# Patient Record
Sex: Male | Born: 1940
Health system: Southern US, Community
[De-identification: ages and names within clinical notes are randomized; demographics above are authoritative.]

## PROBLEM LIST (undated history)

## (undated) DIAGNOSIS — I209 Angina pectoris, unspecified: Secondary | ICD-10-CM

## (undated) DIAGNOSIS — I252 Old myocardial infarction: Secondary | ICD-10-CM

## (undated) DIAGNOSIS — I779 Disorder of arteries and arterioles, unspecified: Secondary | ICD-10-CM

## (undated) DIAGNOSIS — K922 Gastrointestinal hemorrhage, unspecified: Secondary | ICD-10-CM

## (undated) DIAGNOSIS — Z9289 Personal history of other medical treatment: Secondary | ICD-10-CM

## (undated) DIAGNOSIS — D649 Anemia, unspecified: Secondary | ICD-10-CM

## (undated) DIAGNOSIS — K219 Gastro-esophageal reflux disease without esophagitis: Secondary | ICD-10-CM

## (undated) DIAGNOSIS — Z87442 Personal history of urinary calculi: Secondary | ICD-10-CM

## (undated) DIAGNOSIS — R519 Headache, unspecified: Secondary | ICD-10-CM

## (undated) DIAGNOSIS — I10 Essential (primary) hypertension: Secondary | ICD-10-CM

## (undated) DIAGNOSIS — Z992 Dependence on renal dialysis: Secondary | ICD-10-CM

## (undated) DIAGNOSIS — N186 End stage renal disease: Secondary | ICD-10-CM

## (undated) DIAGNOSIS — Z720 Tobacco use: Secondary | ICD-10-CM

## (undated) DIAGNOSIS — F32A Depression, unspecified: Secondary | ICD-10-CM

## (undated) DIAGNOSIS — C61 Malignant neoplasm of prostate: Secondary | ICD-10-CM

## (undated) DIAGNOSIS — D509 Iron deficiency anemia, unspecified: Secondary | ICD-10-CM

## (undated) DIAGNOSIS — I480 Paroxysmal atrial fibrillation: Secondary | ICD-10-CM

## (undated) DIAGNOSIS — I219 Acute myocardial infarction, unspecified: Secondary | ICD-10-CM

## (undated) DIAGNOSIS — I2510 Atherosclerotic heart disease of native coronary artery without angina pectoris: Secondary | ICD-10-CM

## (undated) DIAGNOSIS — E7800 Pure hypercholesterolemia, unspecified: Secondary | ICD-10-CM

## (undated) DIAGNOSIS — R51 Headache: Secondary | ICD-10-CM

## (undated) DIAGNOSIS — I5189 Other ill-defined heart diseases: Secondary | ICD-10-CM

## (undated) DIAGNOSIS — I739 Peripheral vascular disease, unspecified: Secondary | ICD-10-CM

## (undated) DIAGNOSIS — F329 Major depressive disorder, single episode, unspecified: Secondary | ICD-10-CM

## (undated) DIAGNOSIS — M1990 Unspecified osteoarthritis, unspecified site: Secondary | ICD-10-CM

## (undated) DIAGNOSIS — R011 Cardiac murmur, unspecified: Secondary | ICD-10-CM

## (undated) HISTORY — DX: Paroxysmal atrial fibrillation: I48.0

## (undated) HISTORY — DX: Atherosclerotic heart disease of native coronary artery without angina pectoris: I25.10

## (undated) HISTORY — DX: End stage renal disease: N18.6

## (undated) HISTORY — DX: Other ill-defined heart diseases: I51.89

## (undated) HISTORY — DX: Peripheral vascular disease, unspecified: I73.9

## (undated) HISTORY — DX: Iron deficiency anemia, unspecified: D50.9

## (undated) HISTORY — DX: Gastrointestinal hemorrhage, unspecified: K92.2

## (undated) HISTORY — DX: Disorder of arteries and arterioles, unspecified: I77.9

## (undated) HISTORY — PX: TONSILLECTOMY: SUR1361

## (undated) HISTORY — DX: Tobacco use: Z72.0

## (undated) HISTORY — DX: Anemia, unspecified: D64.9

## (undated) HISTORY — DX: Acute myocardial infarction, unspecified: I21.9

---

## 1998-12-14 ENCOUNTER — Encounter: Payer: Self-pay | Admitting: Emergency Medicine

## 1998-12-14 ENCOUNTER — Emergency Department (HOSPITAL_COMMUNITY): Admission: EM | Admit: 1998-12-14 | Discharge: 1998-12-14 | Payer: Self-pay | Admitting: Emergency Medicine

## 2000-04-24 ENCOUNTER — Emergency Department (HOSPITAL_COMMUNITY): Admission: EM | Admit: 2000-04-24 | Discharge: 2000-04-25 | Payer: Self-pay | Admitting: Emergency Medicine

## 2000-04-25 ENCOUNTER — Encounter: Payer: Self-pay | Admitting: Emergency Medicine

## 2001-03-21 ENCOUNTER — Encounter: Payer: Self-pay | Admitting: Emergency Medicine

## 2001-03-21 ENCOUNTER — Emergency Department (HOSPITAL_COMMUNITY): Admission: EM | Admit: 2001-03-21 | Discharge: 2001-03-21 | Payer: Self-pay | Admitting: Emergency Medicine

## 2002-07-16 ENCOUNTER — Emergency Department (HOSPITAL_COMMUNITY): Admission: EM | Admit: 2002-07-16 | Discharge: 2002-07-16 | Payer: Self-pay | Admitting: Emergency Medicine

## 2002-07-16 ENCOUNTER — Encounter: Payer: Self-pay | Admitting: Emergency Medicine

## 2002-07-17 ENCOUNTER — Encounter: Payer: Self-pay | Admitting: *Deleted

## 2002-08-19 ENCOUNTER — Encounter: Admission: RE | Admit: 2002-08-19 | Discharge: 2002-08-19 | Payer: Self-pay | Admitting: Internal Medicine

## 2004-03-25 ENCOUNTER — Emergency Department (HOSPITAL_COMMUNITY): Admission: EM | Admit: 2004-03-25 | Discharge: 2004-03-25 | Payer: Self-pay | Admitting: Emergency Medicine

## 2004-03-27 ENCOUNTER — Ambulatory Visit: Payer: Self-pay | Admitting: Internal Medicine

## 2004-04-06 ENCOUNTER — Ambulatory Visit (HOSPITAL_COMMUNITY): Admission: RE | Admit: 2004-04-06 | Discharge: 2004-04-06 | Payer: Self-pay | Admitting: Internal Medicine

## 2004-04-06 ENCOUNTER — Ambulatory Visit: Payer: Self-pay | Admitting: Internal Medicine

## 2004-04-06 ENCOUNTER — Encounter (INDEPENDENT_AMBULATORY_CARE_PROVIDER_SITE_OTHER): Payer: Self-pay | Admitting: *Deleted

## 2004-05-21 ENCOUNTER — Ambulatory Visit: Payer: Self-pay | Admitting: Internal Medicine

## 2004-07-10 ENCOUNTER — Ambulatory Visit: Payer: Self-pay | Admitting: Internal Medicine

## 2004-07-12 ENCOUNTER — Ambulatory Visit (HOSPITAL_COMMUNITY): Admission: RE | Admit: 2004-07-12 | Discharge: 2004-07-12 | Payer: Self-pay | Admitting: Internal Medicine

## 2004-09-14 ENCOUNTER — Ambulatory Visit (HOSPITAL_COMMUNITY): Admission: RE | Admit: 2004-09-14 | Discharge: 2004-09-14 | Payer: Self-pay | Admitting: Urology

## 2004-10-24 ENCOUNTER — Ambulatory Visit (HOSPITAL_COMMUNITY): Admission: RE | Admit: 2004-10-24 | Discharge: 2004-10-24 | Payer: Self-pay | Admitting: Urology

## 2004-11-06 ENCOUNTER — Ambulatory Visit (HOSPITAL_COMMUNITY): Admission: RE | Admit: 2004-11-06 | Discharge: 2004-11-06 | Payer: Self-pay | Admitting: Urology

## 2005-02-21 ENCOUNTER — Ambulatory Visit: Payer: Self-pay | Admitting: Internal Medicine

## 2005-02-28 ENCOUNTER — Ambulatory Visit: Payer: Self-pay | Admitting: Internal Medicine

## 2005-02-28 ENCOUNTER — Ambulatory Visit (HOSPITAL_COMMUNITY): Admission: RE | Admit: 2005-02-28 | Discharge: 2005-02-28 | Payer: Self-pay | Admitting: Internal Medicine

## 2005-03-26 ENCOUNTER — Ambulatory Visit: Payer: Self-pay | Admitting: Hospitalist

## 2005-04-10 ENCOUNTER — Ambulatory Visit: Payer: Self-pay | Admitting: Internal Medicine

## 2008-02-19 ENCOUNTER — Ambulatory Visit (HOSPITAL_COMMUNITY): Admission: RE | Admit: 2008-02-19 | Discharge: 2008-02-19 | Payer: Self-pay | Admitting: Urology

## 2009-04-26 ENCOUNTER — Ambulatory Visit (HOSPITAL_COMMUNITY): Admission: RE | Admit: 2009-04-26 | Discharge: 2009-04-26 | Payer: Self-pay | Admitting: Urology

## 2009-05-12 ENCOUNTER — Ambulatory Visit: Admission: RE | Admit: 2009-05-12 | Discharge: 2009-06-01 | Payer: Self-pay | Admitting: Radiation Oncology

## 2009-07-17 ENCOUNTER — Encounter: Admission: RE | Admit: 2009-07-17 | Discharge: 2009-07-17 | Payer: Self-pay | Admitting: Family Medicine

## 2009-10-06 ENCOUNTER — Ambulatory Visit: Admission: RE | Admit: 2009-10-06 | Discharge: 2010-01-04 | Payer: Self-pay | Admitting: Radiation Oncology

## 2009-12-26 LAB — URINALYSIS, MICROSCOPIC - CHCC
Bilirubin (Urine): NEGATIVE
Glucose: NEGATIVE g/dL
Ketones: NEGATIVE mg/dL
Leukocyte Esterase: NEGATIVE
Nitrite: NEGATIVE
Protein: NEGATIVE mg/dL
Specific Gravity, Urine: 1.02 (ref 1.003–1.035)
pH: 5 (ref 4.6–8.0)

## 2009-12-28 LAB — URINE CULTURE

## 2010-01-04 ENCOUNTER — Ambulatory Visit: Admission: RE | Admit: 2010-01-04 | Discharge: 2010-02-03 | Payer: Self-pay | Admitting: Radiation Oncology

## 2010-01-17 ENCOUNTER — Encounter: Admission: RE | Admit: 2010-01-17 | Payer: Self-pay | Admitting: Family Medicine

## 2010-03-08 ENCOUNTER — Encounter (HOSPITAL_COMMUNITY)
Admission: RE | Admit: 2010-03-08 | Discharge: 2010-04-17 | Payer: Self-pay | Source: Home / Self Care | Attending: Nephrology | Admitting: Nephrology

## 2010-03-21 LAB — POCT HEMOGLOBIN-HEMACUE: Hemoglobin: 10.9 g/dL — ABNORMAL LOW (ref 13.0–17.0)

## 2010-04-02 LAB — POCT HEMOGLOBIN-HEMACUE: Hemoglobin: 11.6 g/dL — ABNORMAL LOW (ref 13.0–17.0)

## 2010-04-07 ENCOUNTER — Encounter: Payer: Self-pay | Admitting: Family Medicine

## 2010-04-08 ENCOUNTER — Encounter: Payer: Self-pay | Admitting: Urology

## 2010-04-09 LAB — RENAL FUNCTION PANEL
Albumin: 4.3 g/dL (ref 3.5–5.2)
BUN: 66 mg/dL — ABNORMAL HIGH (ref 6–23)
CO2: 13 mEq/L — ABNORMAL LOW (ref 19–32)
Calcium: 9.3 mg/dL (ref 8.4–10.5)
Chloride: 112 mEq/L (ref 96–112)
Creatinine, Ser: 3.45 mg/dL — ABNORMAL HIGH (ref 0.4–1.5)
GFR calc Af Amer: 22 mL/min — ABNORMAL LOW (ref >60)
GFR calc non Af Amer: 18 mL/min — ABNORMAL LOW (ref >60)
Glucose, Bld: 103 mg/dL — ABNORMAL HIGH (ref 70–99)
Phosphorus: 4.4 mg/dL (ref 2.3–4.6)
Potassium: 5 mEq/L (ref 3.5–5.1)
Sodium: 135 mEq/L (ref 135–145)

## 2010-04-09 LAB — FERRITIN: Ferritin: 460 ng/mL — ABNORMAL HIGH (ref 22–322)

## 2010-04-09 LAB — IRON AND TIBC
Iron: 56 ug/dL (ref 42–135)
Saturation Ratios: 21 % (ref 20–55)
TIBC: 271 ug/dL (ref 215–435)
UIBC: 215 ug/dL

## 2010-04-09 LAB — POCT HEMOGLOBIN-HEMACUE: Hemoglobin: 12.5 g/dL — ABNORMAL LOW (ref 13.0–17.0)

## 2010-04-18 ENCOUNTER — Other Ambulatory Visit: Payer: Self-pay

## 2010-04-18 ENCOUNTER — Encounter (HOSPITAL_COMMUNITY): Payer: MEDICARE | Attending: Nephrology

## 2010-04-18 DIAGNOSIS — N183 Chronic kidney disease, stage 3 unspecified: Secondary | ICD-10-CM | POA: Insufficient documentation

## 2010-04-18 DIAGNOSIS — D638 Anemia in other chronic diseases classified elsewhere: Secondary | ICD-10-CM | POA: Insufficient documentation

## 2010-05-02 ENCOUNTER — Encounter (HOSPITAL_COMMUNITY): Payer: MEDICARE

## 2010-05-02 ENCOUNTER — Other Ambulatory Visit: Payer: Self-pay

## 2010-05-02 LAB — POCT HEMOGLOBIN-HEMACUE: Hemoglobin: 11.4 g/dL — ABNORMAL LOW (ref 13.0–17.0)

## 2010-05-07 LAB — POCT HEMOGLOBIN-HEMACUE: Hemoglobin: 12.2 g/dL — ABNORMAL LOW (ref 13.0–17.0)

## 2010-05-16 ENCOUNTER — Other Ambulatory Visit: Payer: Self-pay | Admitting: Nephrology

## 2010-05-16 ENCOUNTER — Other Ambulatory Visit: Payer: Self-pay

## 2010-05-16 ENCOUNTER — Encounter (HOSPITAL_COMMUNITY): Payer: MEDICARE

## 2010-05-16 LAB — IRON AND TIBC
Iron: 82 ug/dL (ref 42–135)
Saturation Ratios: 33 % (ref 20–55)
TIBC: 250 ug/dL (ref 215–435)
UIBC: 168 ug/dL

## 2010-05-16 LAB — POCT HEMOGLOBIN-HEMACUE: Hemoglobin: 9.8 g/dL — ABNORMAL LOW (ref 13.0–17.0)

## 2010-05-16 LAB — RENAL FUNCTION PANEL
Albumin: 3.8 g/dL (ref 3.5–5.2)
BUN: 38 mg/dL — ABNORMAL HIGH (ref 6–23)
CO2: 21 mEq/L (ref 19–32)
Calcium: 9.3 mg/dL (ref 8.4–10.5)
Chloride: 107 mEq/L (ref 96–112)
Creatinine, Ser: 2.59 mg/dL — ABNORMAL HIGH (ref 0.4–1.5)
GFR calc Af Amer: 30 mL/min — ABNORMAL LOW (ref >60)
GFR calc non Af Amer: 25 mL/min — ABNORMAL LOW (ref >60)
Glucose, Bld: 114 mg/dL — ABNORMAL HIGH (ref 70–99)
Phosphorus: 2.7 mg/dL (ref 2.3–4.6)
Potassium: 5.3 mEq/L — ABNORMAL HIGH (ref 3.5–5.1)
Sodium: 136 mEq/L (ref 135–145)

## 2010-05-16 LAB — FERRITIN: Ferritin: 873 ng/mL — ABNORMAL HIGH (ref 22–322)

## 2010-05-28 LAB — IRON AND TIBC: Iron: 62 ug/dL (ref 42–135)

## 2010-05-28 LAB — POCT HEMOGLOBIN-HEMACUE: Hemoglobin: 9.4 g/dL — ABNORMAL LOW (ref 13.0–17.0)

## 2010-05-30 ENCOUNTER — Other Ambulatory Visit: Payer: Self-pay | Admitting: Nephrology

## 2010-05-30 ENCOUNTER — Encounter (HOSPITAL_COMMUNITY): Payer: Medicare Other | Attending: Nephrology

## 2010-05-30 ENCOUNTER — Other Ambulatory Visit: Payer: Self-pay

## 2010-05-30 DIAGNOSIS — D638 Anemia in other chronic diseases classified elsewhere: Secondary | ICD-10-CM | POA: Insufficient documentation

## 2010-05-30 DIAGNOSIS — N183 Chronic kidney disease, stage 3 unspecified: Secondary | ICD-10-CM | POA: Insufficient documentation

## 2010-05-31 LAB — PTH, INTACT AND CALCIUM
Calcium, Total (PTH): 9.5 mg/dL (ref 8.4–10.5)
PTH: 43.2 pg/mL (ref 14.0–72.0)

## 2010-06-13 ENCOUNTER — Other Ambulatory Visit: Payer: Self-pay | Admitting: Nephrology

## 2010-06-13 ENCOUNTER — Encounter (HOSPITAL_COMMUNITY): Payer: Medicare Other

## 2010-06-13 LAB — IRON AND TIBC
Saturation Ratios: 32 % (ref 20–55)
TIBC: 229 ug/dL (ref 215–435)

## 2010-06-27 ENCOUNTER — Encounter (HOSPITAL_COMMUNITY): Payer: MEDICARE | Attending: Nephrology

## 2010-06-27 ENCOUNTER — Other Ambulatory Visit: Payer: Self-pay | Admitting: Nephrology

## 2010-06-27 DIAGNOSIS — D638 Anemia in other chronic diseases classified elsewhere: Secondary | ICD-10-CM | POA: Insufficient documentation

## 2010-06-27 DIAGNOSIS — N183 Chronic kidney disease, stage 3 unspecified: Secondary | ICD-10-CM | POA: Insufficient documentation

## 2010-07-11 ENCOUNTER — Encounter (HOSPITAL_COMMUNITY): Payer: MEDICARE

## 2010-07-11 ENCOUNTER — Other Ambulatory Visit: Payer: Self-pay | Admitting: Nephrology

## 2010-07-11 LAB — IRON AND TIBC
Saturation Ratios: 35 % (ref 20–55)
TIBC: 285 ug/dL (ref 215–435)

## 2010-07-11 LAB — FERRITIN: Ferritin: 537 ng/mL — ABNORMAL HIGH (ref 22–322)

## 2010-07-25 ENCOUNTER — Encounter (HOSPITAL_COMMUNITY): Payer: Medicare Other | Attending: Nephrology

## 2010-07-25 ENCOUNTER — Other Ambulatory Visit: Payer: Self-pay | Admitting: Nephrology

## 2010-07-25 DIAGNOSIS — N183 Chronic kidney disease, stage 3 unspecified: Secondary | ICD-10-CM | POA: Insufficient documentation

## 2010-07-25 DIAGNOSIS — D638 Anemia in other chronic diseases classified elsewhere: Secondary | ICD-10-CM | POA: Insufficient documentation

## 2010-07-25 LAB — POCT HEMOGLOBIN-HEMACUE: Hemoglobin: 11.8 g/dL — ABNORMAL LOW (ref 13.0–17.0)

## 2010-08-08 ENCOUNTER — Other Ambulatory Visit: Payer: Self-pay | Admitting: Nephrology

## 2010-08-08 ENCOUNTER — Encounter (HOSPITAL_COMMUNITY): Payer: Medicare Other

## 2010-08-08 LAB — RENAL FUNCTION PANEL
BUN: 41 mg/dL — ABNORMAL HIGH (ref 6–23)
Calcium: 9.4 mg/dL (ref 8.4–10.5)
Glucose, Bld: 79 mg/dL (ref 70–99)
Phosphorus: 2.6 mg/dL (ref 2.3–4.6)
Potassium: 4.6 mEq/L (ref 3.5–5.1)

## 2010-08-08 LAB — IRON AND TIBC
TIBC: 244 ug/dL (ref 215–435)
UIBC: 152 ug/dL

## 2010-08-08 LAB — FERRITIN: Ferritin: 596 ng/mL — ABNORMAL HIGH (ref 22–322)

## 2010-08-09 LAB — PTH, INTACT AND CALCIUM: PTH: 52.4 pg/mL (ref 14.0–72.0)

## 2010-08-22 ENCOUNTER — Encounter (HOSPITAL_COMMUNITY)
Admission: RE | Admit: 2010-08-22 | Discharge: 2010-08-22 | Disposition: A | Discharge status: Home / Self Care | Payer: Medicare Other | Source: Ambulatory Visit | Attending: Nephrology | Admitting: Nephrology

## 2010-08-22 ENCOUNTER — Other Ambulatory Visit: Payer: Self-pay | Admitting: Nephrology

## 2010-08-22 DIAGNOSIS — N183 Chronic kidney disease, stage 3 unspecified: Secondary | ICD-10-CM | POA: Insufficient documentation

## 2010-08-22 DIAGNOSIS — D638 Anemia in other chronic diseases classified elsewhere: Secondary | ICD-10-CM | POA: Insufficient documentation

## 2010-08-22 LAB — FERRITIN: Ferritin: 710 ng/mL — ABNORMAL HIGH (ref 22–322)

## 2010-08-22 LAB — IRON AND TIBC: Iron: 81 ug/dL (ref 42–135)

## 2010-09-03 ENCOUNTER — Ambulatory Visit
Admission: RE | Admit: 2010-09-03 | Discharge: 2010-09-03 | Disposition: A | Discharge status: Home / Self Care | Payer: Medicare Other | Source: Ambulatory Visit | Attending: Radiation Oncology | Admitting: Radiation Oncology

## 2010-09-05 ENCOUNTER — Other Ambulatory Visit: Payer: Self-pay | Admitting: Nephrology

## 2010-09-05 ENCOUNTER — Encounter (HOSPITAL_COMMUNITY): Payer: Medicare Other

## 2010-09-05 LAB — RENAL FUNCTION PANEL
CO2: 21 mEq/L (ref 19–32)
Chloride: 111 mEq/L (ref 96–112)
Creatinine, Ser: 2.73 mg/dL — ABNORMAL HIGH (ref 0.50–1.35)
GFR calc Af Amer: 28 mL/min — ABNORMAL LOW (ref >60)
GFR calc non Af Amer: 23 mL/min — ABNORMAL LOW (ref >60)
Potassium: 4.5 mEq/L (ref 3.5–5.1)
Sodium: 139 mEq/L (ref 135–145)

## 2010-09-06 LAB — PTH, INTACT AND CALCIUM
Calcium, Total (PTH): 9.3 mg/dL (ref 8.4–10.5)
PTH: 47 pg/mL (ref 14.0–72.0)

## 2010-09-06 LAB — POCT HEMOGLOBIN-HEMACUE: Hemoglobin: 10.4 g/dL — ABNORMAL LOW (ref 13.0–17.0)

## 2010-09-20 ENCOUNTER — Encounter (HOSPITAL_COMMUNITY): Payer: Medicare Other

## 2010-09-21 ENCOUNTER — Other Ambulatory Visit: Payer: Self-pay | Admitting: Nephrology

## 2010-09-21 ENCOUNTER — Encounter (HOSPITAL_COMMUNITY)
Admission: RE | Admit: 2010-09-21 | Discharge: 2010-09-21 | Disposition: A | Discharge status: Home / Self Care | Payer: Medicare Other | Source: Ambulatory Visit | Attending: Nephrology | Admitting: Nephrology

## 2010-09-21 DIAGNOSIS — D638 Anemia in other chronic diseases classified elsewhere: Secondary | ICD-10-CM | POA: Insufficient documentation

## 2010-09-21 DIAGNOSIS — N183 Chronic kidney disease, stage 3 unspecified: Secondary | ICD-10-CM | POA: Insufficient documentation

## 2010-10-03 ENCOUNTER — Encounter (HOSPITAL_COMMUNITY): Payer: Medicare Other

## 2010-10-05 ENCOUNTER — Encounter (HOSPITAL_COMMUNITY): Payer: Medicare Other

## 2010-10-05 ENCOUNTER — Other Ambulatory Visit: Payer: Self-pay | Admitting: Nephrology

## 2010-10-05 LAB — POCT HEMOGLOBIN-HEMACUE: Hemoglobin: 10.5 g/dL — ABNORMAL LOW (ref 13.0–17.0)

## 2010-10-05 LAB — IRON AND TIBC
Iron: 109 ug/dL (ref 42–135)
Saturation Ratios: 40 % (ref 20–55)
TIBC: 270 ug/dL (ref 215–435)
UIBC: 161 ug/dL

## 2010-10-17 ENCOUNTER — Other Ambulatory Visit: Payer: Self-pay | Admitting: Nephrology

## 2010-10-17 ENCOUNTER — Encounter (HOSPITAL_COMMUNITY): Payer: Medicare Other | Attending: Nephrology

## 2010-10-17 DIAGNOSIS — D638 Anemia in other chronic diseases classified elsewhere: Secondary | ICD-10-CM | POA: Insufficient documentation

## 2010-10-17 DIAGNOSIS — N183 Chronic kidney disease, stage 3 unspecified: Secondary | ICD-10-CM | POA: Insufficient documentation

## 2010-10-31 ENCOUNTER — Other Ambulatory Visit: Payer: Self-pay | Admitting: Nephrology

## 2010-10-31 ENCOUNTER — Encounter (HOSPITAL_COMMUNITY): Payer: Medicare Other

## 2010-10-31 LAB — IRON AND TIBC
Saturation Ratios: 29 % (ref 20–55)
TIBC: 295 ug/dL (ref 215–435)
UIBC: 210 ug/dL

## 2010-11-14 ENCOUNTER — Encounter (HOSPITAL_COMMUNITY): Payer: Medicare Other

## 2010-11-14 ENCOUNTER — Other Ambulatory Visit: Payer: Self-pay | Admitting: Nephrology

## 2010-11-28 ENCOUNTER — Other Ambulatory Visit: Payer: Self-pay | Admitting: Nephrology

## 2010-11-28 ENCOUNTER — Encounter (HOSPITAL_COMMUNITY): Payer: Medicare Other | Attending: Nephrology

## 2010-11-28 DIAGNOSIS — D638 Anemia in other chronic diseases classified elsewhere: Secondary | ICD-10-CM | POA: Insufficient documentation

## 2010-11-28 DIAGNOSIS — N183 Chronic kidney disease, stage 3 unspecified: Secondary | ICD-10-CM | POA: Insufficient documentation

## 2010-11-28 LAB — IRON AND TIBC
Iron: 98 ug/dL (ref 42–135)
Saturation Ratios: 34 % (ref 20–55)
TIBC: 285 ug/dL (ref 215–435)
UIBC: 187 ug/dL (ref 125–400)

## 2010-11-28 LAB — FERRITIN: Ferritin: 1445 ng/mL — ABNORMAL HIGH (ref 22–322)

## 2010-11-28 LAB — POCT HEMOGLOBIN-HEMACUE: Hemoglobin: 11.2 g/dL — ABNORMAL LOW (ref 13.0–17.0)

## 2010-12-12 ENCOUNTER — Other Ambulatory Visit: Payer: Self-pay | Admitting: Nephrology

## 2010-12-12 ENCOUNTER — Encounter (HOSPITAL_COMMUNITY): Payer: Medicare Other

## 2010-12-26 ENCOUNTER — Encounter (HOSPITAL_COMMUNITY): Payer: Medicare Other | Attending: Nephrology

## 2010-12-26 ENCOUNTER — Other Ambulatory Visit: Payer: Self-pay | Admitting: Nephrology

## 2010-12-26 DIAGNOSIS — D638 Anemia in other chronic diseases classified elsewhere: Secondary | ICD-10-CM | POA: Insufficient documentation

## 2010-12-26 DIAGNOSIS — N183 Chronic kidney disease, stage 3 unspecified: Secondary | ICD-10-CM | POA: Insufficient documentation

## 2010-12-26 LAB — RENAL FUNCTION PANEL
BUN: 54 mg/dL — ABNORMAL HIGH (ref 6–23)
CO2: 17 mEq/L — ABNORMAL LOW (ref 19–32)
Calcium: 10.3 mg/dL (ref 8.4–10.5)
Creatinine, Ser: 2.72 mg/dL — ABNORMAL HIGH (ref 0.50–1.35)
Glucose, Bld: 106 mg/dL — ABNORMAL HIGH (ref 70–99)
Phosphorus: 3.6 mg/dL (ref 2.3–4.6)

## 2010-12-26 LAB — IRON AND TIBC
Saturation Ratios: 30 % (ref 20–55)
UIBC: 206 ug/dL (ref 125–400)

## 2010-12-26 LAB — FERRITIN: Ferritin: 1004 ng/mL — ABNORMAL HIGH (ref 22–322)

## 2011-01-09 ENCOUNTER — Encounter (HOSPITAL_COMMUNITY)
Admission: RE | Admit: 2011-01-09 | Discharge: 2011-01-09 | Payer: Medicare Other | Source: Ambulatory Visit | Attending: Nephrology | Admitting: Nephrology

## 2011-01-09 ENCOUNTER — Other Ambulatory Visit: Payer: Self-pay | Admitting: Nephrology

## 2011-02-04 ENCOUNTER — Other Ambulatory Visit (HOSPITAL_COMMUNITY): Payer: Self-pay | Admitting: *Deleted

## 2011-02-06 ENCOUNTER — Encounter (HOSPITAL_COMMUNITY)
Admission: RE | Admit: 2011-02-06 | Discharge: 2011-02-06 | Disposition: A | Discharge status: Home / Self Care | Payer: Medicare Other | Source: Ambulatory Visit | Attending: Nephrology | Admitting: Nephrology

## 2011-02-06 DIAGNOSIS — N183 Chronic kidney disease, stage 3 unspecified: Secondary | ICD-10-CM | POA: Insufficient documentation

## 2011-02-06 DIAGNOSIS — D638 Anemia in other chronic diseases classified elsewhere: Secondary | ICD-10-CM | POA: Insufficient documentation

## 2011-02-06 LAB — IRON AND TIBC
Iron: 65 ug/dL (ref 42–135)
Saturation Ratios: 27 % (ref 20–55)
TIBC: 239 ug/dL (ref 215–435)
UIBC: 174 ug/dL (ref 125–400)

## 2011-02-06 MED ORDER — EPOETIN ALFA 10000 UNIT/ML IJ SOLN
30000.0000 [IU] | INTRAMUSCULAR | Status: DC
  Administered 2011-02-06: 30000 [IU] via SUBCUTANEOUS

## 2011-02-06 MED ORDER — EPOETIN ALFA 10000 UNIT/ML IJ SOLN
INTRAMUSCULAR | Status: AC
  Administered 2011-02-06: 30000 [IU] via SUBCUTANEOUS
  Filled 2011-02-06: qty 1

## 2011-02-06 MED ORDER — EPOETIN ALFA 20000 UNIT/ML IJ SOLN
INTRAMUSCULAR | Status: AC
  Filled 2011-02-06: qty 1

## 2011-02-24 ENCOUNTER — Emergency Department (HOSPITAL_COMMUNITY)
Admission: EM | Admit: 2011-02-24 | Discharge: 2011-02-24 | Disposition: A | Discharge status: Home / Self Care | Payer: Medicare Other | Attending: Emergency Medicine | Admitting: Emergency Medicine

## 2011-02-24 ENCOUNTER — Encounter: Payer: Self-pay | Admitting: Family Medicine

## 2011-02-24 DIAGNOSIS — Z8546 Personal history of malignant neoplasm of prostate: Secondary | ICD-10-CM | POA: Insufficient documentation

## 2011-02-24 DIAGNOSIS — M109 Gout, unspecified: Secondary | ICD-10-CM | POA: Insufficient documentation

## 2011-02-24 DIAGNOSIS — M25469 Effusion, unspecified knee: Secondary | ICD-10-CM | POA: Insufficient documentation

## 2011-02-24 DIAGNOSIS — M25429 Effusion, unspecified elbow: Secondary | ICD-10-CM | POA: Insufficient documentation

## 2011-02-24 DIAGNOSIS — L989 Disorder of the skin and subcutaneous tissue, unspecified: Secondary | ICD-10-CM | POA: Insufficient documentation

## 2011-02-24 DIAGNOSIS — M25569 Pain in unspecified knee: Secondary | ICD-10-CM | POA: Insufficient documentation

## 2011-02-24 DIAGNOSIS — R269 Unspecified abnormalities of gait and mobility: Secondary | ICD-10-CM | POA: Insufficient documentation

## 2011-02-24 DIAGNOSIS — F17200 Nicotine dependence, unspecified, uncomplicated: Secondary | ICD-10-CM | POA: Insufficient documentation

## 2011-02-24 HISTORY — DX: Malignant neoplasm of prostate: C61

## 2011-02-24 MED ORDER — OXYCODONE-ACETAMINOPHEN 5-325 MG PO TABS
1.0000 | ORAL_TABLET | Freq: Once | ORAL | Status: AC
  Administered 2011-02-24: 1 via ORAL
  Filled 2011-02-24: qty 1

## 2011-02-24 MED ORDER — OXYCODONE-ACETAMINOPHEN 5-325 MG PO TABS
1.0000 | ORAL_TABLET | Freq: Four times a day (QID) | ORAL | Status: AC | PRN
Start: 2011-02-24 — End: 2011-03-06

## 2011-02-24 MED ORDER — PREDNISONE 10 MG PO TABS: 20.0000 mg | ORAL_TABLET | Freq: Every day | ORAL | Status: DC

## 2011-02-24 MED ORDER — PREDNISONE 20 MG PO TABS
60.0000 mg | ORAL_TABLET | Freq: Once | ORAL | Status: AC
  Administered 2011-02-24: 60 mg via ORAL
  Filled 2011-02-24: qty 3

## 2011-02-24 NOTE — ED Notes (Signed)
Per EMS, pt having gout flare up that started 1 week ago. sts pain in right knee down to his foot and left elbow

## 2011-02-24 NOTE — ED Provider Notes (Signed)
Medical screening examination/treatment/procedure(s) were conducted as a shared visit with non-physician practitioner(s) and myself.  I personally evaluated the patient during the encounter Patient with multiple areas of gout. History of renal insufficiency so unable to take colchicine or indomethacin. We'll treat with steroids and pain control.  Blanchie Dessert, MD 02/24/11 [phone]

## 2011-02-24 NOTE — ED Notes (Signed)
sts pain is better in his knee but his elbow still hurts prior to 2nd percocet

## 2011-02-24 NOTE — ED Provider Notes (Signed)
History     CSN: [id number] Arrival date & time: 02/24/2011  5:00 PM   First MD Initiated Contact with Patient 02/24/11 1712      Chief Complaint  Patient presents with  . Gout    (Consider location/radiation/quality/duration/timing/severity/associated sxs/prior treatment) The history is provided by the patient.   the patient is a 70 year old male with a history of gout and renal insufficiency who presents with complaint of an acute gout attack. He describes that 2 weeks ago, he began to have pain in his right knee. He has not taken anything for this pain and it has gradually progressed to include his right foot and the left elbow as well. The pain is throbbing, constant, severe and there are no alleviating factors no palpation and movement of the affected joints aggravate the pain. There has been no prior treatment.  Past Medical History  Diagnosis Date  . Gout   . Renal insufficiency   . Prostate cancer     History reviewed. No pertinent past surgical history.  History reviewed. No pertinent family history.  History  Substance Use Topics  . Smoking status: Current Everyday Smoker  . Smokeless tobacco: Not on file  . Alcohol Use:       Review of Systems  Constitutional: Negative for fever and chills.  HENT: Negative for ear pain, sore throat, neck pain and neck stiffness.   Eyes: Negative for pain and visual disturbance.  Respiratory: Negative for cough, chest tightness and shortness of breath.   Cardiovascular: Negative for chest pain and palpitations.  Gastrointestinal: Negative for nausea, vomiting and abdominal pain.  Genitourinary: Negative for dysuria and flank pain.  Musculoskeletal: Positive for joint swelling and gait problem. Negative for back pain.  Skin: Positive for color change. Negative for rash and wound.  Neurological: Negative for weakness, numbness and headaches.  Psychiatric/Behavioral: Negative for behavioral problems and confusion.     Allergies  Review of patient's allergies indicates no known allergies.  Home Medications   Current Outpatient Rx  Name Route Sig Dispense Refill  . TELMISARTAN 40 MG PO TABS Oral Take 40 mg by mouth daily.        BP 139/83  Pulse 86  Temp(Src) 97.2 F (36.2 C) (Oral)  Resp 20  SpO2 98%  Physical Exam  Nursing note and vitals reviewed. Constitutional: He is oriented to person, place, and time. He appears well-developed and well-nourished.       Uncomfortable appearing  HENT:  Head: Normocephalic and atraumatic.  Right Ear: External ear normal.  Left Ear: External ear normal.       Poor dentition  Eyes: Pupils are equal, round, and reactive to light. Right eye exhibits no discharge. Left eye exhibits no discharge.  Neck: Normal range of motion. Neck supple.  Cardiovascular: Normal rate, regular rhythm and normal heart sounds.   Pulmonary/Chest: Effort normal and breath sounds normal. No respiratory distress. He exhibits no tenderness.  Abdominal: Soft. Bowel sounds are normal. He exhibits no distension. There is no tenderness.  Musculoskeletal:       Left elbow: He exhibits swelling. He exhibits normal range of motion. tenderness found.       Right knee: He exhibits swelling. He exhibits normal range of motion, no ecchymosis, no deformity and no erythema. tenderness found.       Right ankle: He exhibits swelling. He exhibits normal range of motion, no deformity and normal pulse.       Full range of motion in all joints with  good strength is tested in all extremities.  Lymphadenopathy:    He has no cervical adenopathy.  Neurological: He is alert and oriented to person, place, and time. No cranial nerve deficit.  Skin: Skin is warm and dry. No rash noted. There is erythema.       Erythema over the right ankle and left elbow    ED Course  Procedures (including critical care time)  Labs Reviewed - No data to display No results found.   Diagnosis #1 gout   MDM   Patient with a history of gout presents with what he describes as an acute gout flareup that has progressively worsened over last 2 weeks. He has not taken anything for this. As he has a history of renal insufficiency, he is not a candidate for indomethacin. He does not have a history of diabetes, so I will start him on a course of steroid therapy and he'll be discharged home with this as well as pain medication. He is to follow up with his primary doctor, Dr. [name] with a phone call tomorrow morning.        [place], [place] 02/24/11 2038

## 2011-02-24 NOTE — Discharge Instructions (Signed)
Gout Gout is an inflammatory condition (arthritis) caused by a buildup of uric acid crystals in the joints. Uric acid is a chemical that is normally present in the blood. Under some circumstances, uric acid can form into crystals in your joints. This causes joint redness, soreness, and swelling (inflammation). Repeat attacks are common. Over time, uric acid crystals can form into masses (tophi) near a joint, causing disfigurement. Gout is treatable and often preventable. CAUSES  The disease begins with elevated levels of uric acid in the blood. Uric acid is produced by your body when it breaks down a naturally found substance called purines. This also happens when you eat certain foods such as meats and fish. Causes of an elevated uric acid level include:  Being passed down from parent to child (heredity).   Diseases that cause increased uric acid production (obesity, psoriasis, some cancers).   Excessive alcohol use.   Diet, especially diets rich in meat and seafood.   Medicines, including certain cancer-fighting drugs (chemotherapy), diuretics, and aspirin.   Chronic kidney disease. The kidneys are no longer able to remove uric acid well.   Problems with metabolism.  Conditions strongly associated with gout include:  Obesity.   High blood pressure.   High cholesterol.   Diabetes.  Not everyone with elevated uric acid levels gets gout. It is not understood why some people get gout and others do not. Surgery, joint injury, and eating too much of certain foods are some of the factors that can lead to gout. SYMPTOMS   An attack of gout comes on quickly. It causes intense pain with redness, swelling, and warmth in a joint.   Fever can occur.   Often, only one joint is involved. Certain joints are more commonly involved:   Base of the big toe.   Knee.   Ankle.   Wrist.   Finger.  Without treatment, an attack usually goes away in a few days to weeks. Between attacks, you  usually will not have symptoms, which is different from many other forms of arthritis. DIAGNOSIS  Your caregiver will suspect gout based on your symptoms and exam. Removal of fluid from the joint (arthrocentesis) is done to check for uric acid crystals. Your caregiver will give you a medicine that numbs the area (local anesthetic) and use a needle to remove joint fluid for exam. Gout is confirmed when uric acid crystals are seen in joint fluid, using a special microscope. Sometimes, blood, urine, and X-ray tests are also used. TREATMENT  There are 2 phases to gout treatment: treating the sudden onset (acute) attack and preventing attacks (prophylaxis). Treatment of an Acute Attack  Medicines are used. These include anti-inflammatory medicines or steroid medicines.   An injection of steroid medicine into the affected joint is sometimes necessary.   The painful joint is rested. Movement can worsen the arthritis.   You may use warm or cold treatments on painful joints, depending which works best for you.   Discuss the use of coffee, vitamin C, or cherries with your caregiver. These may be helpful treatment options.  Treatment to Prevent Attacks After the acute attack subsides, your caregiver may advise prophylactic medicine. These medicines either help your kidneys eliminate uric acid from your body or decrease your uric acid production. You may need to stay on these medicines for a very long time. The early phase of treatment with prophylactic medicine can be associated with an increase in acute gout attacks. For this reason, during the first few months  of treatment, your caregiver may also advise you to take medicines usually used for acute gout treatment. Be sure you understand your caregiver's directions. You should also discuss dietary treatment with your caregiver. Certain foods such as meats and fish can increase uric acid levels. Other foods such as dairy can decrease levels. Your caregiver  can give you a list of foods to avoid. HOME CARE INSTRUCTIONS   Do not take aspirin to relieve pain. This raises uric acid levels.   Only take over-the-counter or prescription medicines for pain, discomfort, or fever as directed by your caregiver.   Rest the joint as much as possible. When in bed, keep sheets and blankets off painful areas.   Keep the affected joint raised (elevated).   Use crutches if the painful joint is in your leg.   Drink enough water and fluids to keep your urine clear or pale yellow. This helps your body get rid of uric acid. Do not drink alcoholic beverages. They slow the passage of uric acid.   Follow your caregiver's dietary instructions. Pay careful attention to the amount of protein you eat. Your daily diet should emphasize fruits, vegetables, whole grains, and fat-free or low-fat milk products.   Maintain a healthy body weight.  SEEK MEDICAL CARE IF:   You have an oral temperature above 102 F (38.9 C).   You develop diarrhea, vomiting, or any side effects from medicines.   You do not feel better in 24 hours, or you are getting worse.  SEEK IMMEDIATE MEDICAL CARE IF:   Your joint becomes suddenly more tender and you have:   Chills.   An oral temperature above 102 F (38.9 C), not controlled by medicine.  MAKE SURE YOU:   Understand these instructions.   Will watch your condition.   Will get help right away if you are not doing well or get worse.  Document Released: 03/01/2000 Document Revised: 11/14/2010 Document Reviewed: 06/12/2009 [facility] Patient Information 2012 [place].    Prednisone tablets What is this medicine? PREDNISONE (PRED ni sone) is a corticosteroid. It is commonly used to treat inflammation of the skin, joints, lungs, and other organs. Common conditions treated include asthma, allergies, and arthritis. It is also used for other conditions, such as blood disorders and diseases of the adrenal glands. This medicine  may be used for other purposes; ask your health care provider or pharmacist if you have questions. What should I tell my health care provider before I take this medicine? They need to know if you have any of these conditions: -Cushing's syndrome -diabetes -glaucoma -heart disease -high blood pressure -infection (especially a virus infection such as chickenpox, cold sores, or herpes) -kidney disease -liver disease -mental illness -myasthenia gravis -osteoporosis -seizures -stomach or intestine problems -thyroid disease -an unusual or allergic reaction to lactose, prednisone, other medicines, foods, dyes, or preservatives -pregnant or trying to get pregnant -breast-feeding How should I use this medicine? Take this medicine by mouth with a glass of water. Follow the directions on the prescription label. Take this medicine with food. If you are taking this medicine once a day, take it in the morning. Do not take more medicine than you are told to take. Do not suddenly stop taking your medicine because you may develop a severe reaction. Your doctor will tell you how much medicine to take. If your doctor wants you to stop the medicine, the dose may be slowly lowered over time to avoid any side effects. Talk to your pediatrician  regarding the use of this medicine in children. Special care may be needed. Overdosage: If you think you have taken too much of this medicine contact a poison control center or emergency room at once. NOTE: This medicine is only for you. Do not share this medicine with others. What if I miss a dose? If you miss a dose, take it as soon as you can. If it is almost time for your next dose, talk to your doctor or health care professional. You may need to miss a dose or take an extra dose. Do not take double or extra doses without advice. What may interact with this medicine? Do not take this medicine with any of the following medications: -metyrapone -mifepristone This  medicine may also interact with the following medications: -aminoglutethimide -amphotericin B -aspirin and aspirin-like medicines -barbiturates -certain medicines for diabetes, like glipizide or glyburide -cholestyramine -cholinesterase inhibitors -cyclosporine -digoxin -diuretics -ephedrine -male hormones, like estrogens and birth control pills -isoniazid -ketoconazole -NSAIDS, medicines for pain and inflammation, like ibuprofen or naproxen -phenytoin -rifampin -toxoids -vaccines -warfarin This list may not describe all possible interactions. Give your health care provider a list of all the medicines, herbs, non-prescription drugs, or dietary supplements you use. Also tell them if you smoke, drink alcohol, or use illegal drugs. Some items may interact with your medicine. What should I watch for while using this medicine? Visit your doctor or health care professional for regular checks on your progress. If you are taking this medicine over a prolonged period, carry an identification card with your name and address, the type and dose of your medicine, and your doctor's name and address. This medicine may increase your risk of getting an infection. Tell your doctor or health care professional if you are around anyone with measles or chickenpox, or if you develop sores or blisters that do not heal properly. If you are going to have surgery, tell your doctor or health care professional that you have taken this medicine within the last twelve months. Ask your doctor or health care professional about your diet. You may need to lower the amount of salt you eat. This medicine may affect blood sugar levels. If you have diabetes, check with your doctor or health care professional before you change your diet or the dose of your diabetic medicine. What side effects may I notice from receiving this medicine? Side effects that you should report to your doctor or health care professional as soon as  possible: -allergic reactions like skin rash, itching or hives, swelling of the face, lips, or tongue -changes in emotions or moods -changes in vision -depressed mood -eye pain -fever or chills, cough, sore throat, pain or difficulty passing urine -increased thirst -swelling of ankles, feet Side effects that usually do not require medical attention (report to your doctor or health care professional if they continue or are bothersome): -confusion, excitement, restlessness -headache -nausea, vomiting -skin problems, acne, thin and shiny skin -trouble sleeping -weight gain This list may not describe all possible side effects. Call your doctor for medical advice about side effects. You may report side effects to FDA at 1-800-FDA-1088. Where should I keep my medicine? Keep out of the reach of children. Store at room temperature between 15 and 30 degrees C (59 and 86 degrees F). Protect from light. Keep container tightly closed. Throw away any unused medicine after the expiration date. NOTE: This sheet is a summary. It may not cover all possible information. If you have questions about this medicine,  talk to your doctor, pharmacist, or health care provider.  2012, [employer]. (10/18/2010 10:57:14 AM)     Acetaminophen; Oxycodone tablets What is this medicine? ACETAMINOPHEN; OXYCODONE (a set a MEE noe fen; ox i KOE done) is a pain reliever. It is used to treat mild to moderate pain. This medicine may be used for other purposes; ask your health care provider or pharmacist if you have questions. What should I tell my health care provider before I take this medicine? They need to know if you have any of these conditions: -brain tumor -Crohn's disease, inflammatory bowel disease, or ulcerative colitis -drink more than 3 alcohol containing drinks per day -drug abuse or addiction -head injury -heart or circulation problems -kidney disease or problems going to the bathroom -liver  disease -lung disease, asthma, or breathing problems -an unusual or allergic reaction to acetaminophen, oxycodone, other opioid analgesics, other medicines, foods, dyes, or preservatives -pregnant or trying to get pregnant -breast-feeding How should I use this medicine? Take this medicine by mouth with a full glass of water. Follow the directions on the prescription label. Take your medicine at regular intervals. Do not take your medicine more often than directed. Talk to your pediatrician regarding the use of this medicine in children. Special care may be needed. Patients over 50 years old may have a stronger reaction and need a smaller dose. Overdosage: If you think you have taken too much of this medicine contact a poison control center or emergency room at once. NOTE: This medicine is only for you. Do not share this medicine with others. What if I miss a dose? If you miss a dose, take it as soon as you can. If it is almost time for your next dose, take only that dose. Do not take double or extra doses. What may interact with this medicine? -alcohol or medicines that contain alcohol -antihistamines -barbiturates like amobarbital, butalbital, butabarbital, methohexital, pentobarbital, phenobarbital, thiopental, and secobarbital -benztropine -drugs for bladder problems like solifenacin, trospium, oxybutynin, tolterodine, hyoscyamine, and methscopolamine -drugs for breathing problems like ipratropium and tiotropium -drugs for certain stomach or intestine problems like propantheline, homatropine methylbromide, glycopyrrolate, atropine, belladonna, and dicyclomine -general anesthetics like etomidate, ketamine, nitrous oxide, propofol, desflurane, enflurane, halothane, isoflurane, and sevoflurane -medicines for depression, anxiety, or psychotic disturbances -medicines for pain like codeine, morphine, pentazocine, buprenorphine, butorphanol, nalbuphine, tramadol, and propoxyphene -medicines for  sleep -muscle relaxants -naltrexone -phenothiazines like perphenazine, thioridazine, chlorpromazine, mesoridazine, fluphenazine, prochlorperazine, promazine, and trifluoperazine -scopolamine -trihexyphenidyl This list may not describe all possible interactions. Give your health care provider a list of all the medicines, herbs, non-prescription drugs, or dietary supplements you use. Also tell them if you smoke, drink alcohol, or use illegal drugs. Some items may interact with your medicine. What should I watch for while using this medicine? Tell your doctor or health care professional if your pain does not go away, if it gets worse, or if you have new or a different type of pain. You may develop tolerance to the medicine. Tolerance means that you will need a higher dose of the medication for pain relief. Tolerance is normal and is expected if you take this medicine for a long time. Do not suddenly stop taking your medicine because you may develop a severe reaction. Your body becomes used to the medicine. This does NOT mean you are addicted. Addiction is a behavior related to getting and using a drug for a nonmedical reason. If you have pain, you have a medical reason to take  pain medicine. Your doctor will tell you how much medicine to take. If your doctor wants you to stop the medicine, the dose will be slowly lowered over time to avoid any side effects. You may get drowsy or dizzy. Do not drive, use machinery, or do anything that needs mental alertness until you know how this medicine affects you. Do not stand or sit up quickly, especially if you are an older patient. This reduces the risk of dizzy or fainting spells. Alcohol may interfere with the effect of this medicine. Avoid alcoholic drinks. The medicine will cause constipation. Try to have a bowel movement at least every 2 to 3 days. If you do not have a bowel movement for 3 days, call your doctor or health care professional. Do not take Tylenol  (acetaminophen) or medicines that have acetaminophen with this medicine. Too much acetaminophen can be very dangerous. Many nonprescription medicines contain acetaminophen. Always read the labels carefully to avoid taking more acetaminophen. What side effects may I notice from receiving this medicine? Side effects that you should report to your doctor or health care professional as soon as possible: -allergic reactions like skin rash, itching or hives, swelling of the face, lips, or tongue -breathing difficulties, wheezing -confusion -light headedness or fainting spells -severe stomach pain -yellowing of the skin or the whites of the eyes Side effects that usually do not require medical attention (report to your doctor or health care professional if they continue or are bothersome): -dizziness -drowsiness -nausea -vomiting This list may not describe all possible side effects. Call your doctor for medical advice about side effects. You may report side effects to FDA at 1-800-FDA-1088. Where should I keep my medicine? Keep out of the reach of children. This medicine can be abused. Keep your medicine in a safe place to protect it from theft. Do not share this medicine with anyone. Selling or giving away this medicine is dangerous and against the law. Store at room temperature between 20 and 25 degrees C (68 and 77 degrees F). Keep container tightly closed. Protect from light. Flush any unused medicines down the toilet. Do not use the medicine after the expiration date. NOTE: This sheet is a summary. It may not cover all possible information. If you have questions about this medicine, talk to your doctor, pharmacist, or health care provider.  2012, [employer]. (02/01/2008 10:01:21 AM)

## 2011-03-06 ENCOUNTER — Encounter (HOSPITAL_COMMUNITY)
Admission: RE | Admit: 2011-03-06 | Discharge: 2011-03-06 | Disposition: A | Discharge status: Home / Self Care | Payer: Medicare Other | Source: Ambulatory Visit | Attending: Nephrology | Admitting: Nephrology

## 2011-03-06 DIAGNOSIS — D638 Anemia in other chronic diseases classified elsewhere: Secondary | ICD-10-CM | POA: Insufficient documentation

## 2011-03-06 DIAGNOSIS — N183 Chronic kidney disease, stage 3 unspecified: Secondary | ICD-10-CM | POA: Insufficient documentation

## 2011-03-06 LAB — IRON AND TIBC: UIBC: 208 ug/dL (ref 125–400)

## 2011-03-06 LAB — POCT HEMOGLOBIN-HEMACUE: Hemoglobin: 9.4 g/dL — ABNORMAL LOW (ref 13.0–17.0)

## 2011-03-06 LAB — FERRITIN: Ferritin: 1516 ng/mL — ABNORMAL HIGH (ref 22–322)

## 2011-03-06 MED ORDER — EPOETIN ALFA 10000 UNIT/ML IJ SOLN
30000.0000 [IU] | INTRAMUSCULAR | Status: DC
  Administered 2011-03-06: 30000 [IU] via SUBCUTANEOUS

## 2011-03-06 MED ORDER — EPOETIN ALFA 10000 UNIT/ML IJ SOLN
INTRAMUSCULAR | Status: AC
  Administered 2011-03-06: 30000 [IU] via SUBCUTANEOUS
  Filled 2011-03-06: qty 1

## 2011-03-06 MED ORDER — EPOETIN ALFA 20000 UNIT/ML IJ SOLN
INTRAMUSCULAR | Status: AC
  Filled 2011-03-06: qty 1

## 2011-03-25 ENCOUNTER — Other Ambulatory Visit (HOSPITAL_COMMUNITY): Payer: Self-pay | Admitting: *Deleted

## 2011-04-03 ENCOUNTER — Encounter (HOSPITAL_COMMUNITY)
Admission: RE | Admit: 2011-04-03 | Discharge: 2011-04-03 | Disposition: A | Discharge status: Home / Self Care | Payer: Medicare Other | Source: Ambulatory Visit | Attending: Nephrology | Admitting: Nephrology

## 2011-04-03 DIAGNOSIS — D638 Anemia in other chronic diseases classified elsewhere: Secondary | ICD-10-CM | POA: Insufficient documentation

## 2011-04-03 DIAGNOSIS — N183 Chronic kidney disease, stage 3 unspecified: Secondary | ICD-10-CM | POA: Insufficient documentation

## 2011-04-03 LAB — RENAL FUNCTION PANEL
Albumin: 3.5 g/dL (ref 3.5–5.2)
BUN: 31 mg/dL — ABNORMAL HIGH (ref 6–23)
Chloride: 102 mEq/L (ref 96–112)
GFR calc non Af Amer: 31 mL/min — ABNORMAL LOW (ref >90)
Phosphorus: 3.9 mg/dL (ref 2.3–4.6)
Potassium: 4.5 mEq/L (ref 3.5–5.1)
Sodium: 133 mEq/L — ABNORMAL LOW (ref 135–145)

## 2011-04-03 LAB — POCT HEMOGLOBIN-HEMACUE: Hemoglobin: 8.6 g/dL — ABNORMAL LOW (ref 13.0–17.0)

## 2011-04-03 MED ORDER — EPOETIN ALFA 10000 UNIT/ML IJ SOLN
30000.0000 [IU] | INTRAMUSCULAR | Status: DC
  Administered 2011-04-03: 30000 [IU] via SUBCUTANEOUS

## 2011-04-16 ENCOUNTER — Other Ambulatory Visit (HOSPITAL_COMMUNITY): Payer: Self-pay | Admitting: *Deleted

## 2011-04-18 ENCOUNTER — Encounter (HOSPITAL_COMMUNITY)
Admission: RE | Admit: 2011-04-18 | Discharge: 2011-04-18 | Disposition: A | Discharge status: Home / Self Care | Payer: Medicare Other | Source: Ambulatory Visit | Attending: Nephrology | Admitting: Nephrology

## 2011-04-18 LAB — IRON AND TIBC
Iron: 36 ug/dL — ABNORMAL LOW (ref 42–135)
UIBC: 226 ug/dL (ref 125–400)

## 2011-04-18 MED ORDER — EPOETIN ALFA 40000 UNIT/ML IJ SOLN
INTRAMUSCULAR | Status: AC
  Administered 2011-04-18: 40000 [IU]
  Filled 2011-04-18: qty 1

## 2011-04-18 MED ORDER — EPOETIN ALFA 20000 UNIT/ML IJ SOLN: 40000.0000 [IU] | INTRAMUSCULAR | Status: DC

## 2011-04-30 ENCOUNTER — Other Ambulatory Visit (HOSPITAL_COMMUNITY): Payer: Self-pay | Admitting: *Deleted

## 2011-05-01 ENCOUNTER — Encounter (HOSPITAL_COMMUNITY): Payer: Medicare Other

## 2011-05-01 ENCOUNTER — Encounter (HOSPITAL_COMMUNITY)
Admission: RE | Admit: 2011-05-01 | Discharge: 2011-05-01 | Disposition: A | Discharge status: Home / Self Care | Payer: Medicare Other | Source: Ambulatory Visit | Attending: Nephrology | Admitting: Nephrology

## 2011-05-01 DIAGNOSIS — D638 Anemia in other chronic diseases classified elsewhere: Secondary | ICD-10-CM | POA: Insufficient documentation

## 2011-05-01 DIAGNOSIS — N183 Chronic kidney disease, stage 3 unspecified: Secondary | ICD-10-CM | POA: Insufficient documentation

## 2011-05-01 LAB — POCT HEMOGLOBIN-HEMACUE: Hemoglobin: 8.6 g/dL — ABNORMAL LOW (ref 13.0–17.0)

## 2011-05-01 MED ORDER — FERUMOXYTOL INJECTION 510 MG/17 ML
INTRAVENOUS | Status: AC
  Administered 2011-05-01: 510 mg via INTRAVENOUS
  Filled 2011-05-01: qty 17

## 2011-05-01 MED ORDER — FERUMOXYTOL INJECTION 510 MG/17 ML
510.0000 mg | Freq: Once | INTRAVENOUS | Status: AC
  Administered 2011-05-01: 510 mg via INTRAVENOUS

## 2011-05-01 MED ORDER — EPOETIN ALFA 40000 UNIT/ML IJ SOLN
INTRAMUSCULAR | Status: AC
  Administered 2011-05-01: 40000 [IU] via SUBCUTANEOUS
  Filled 2011-05-01: qty 1

## 2011-05-01 MED ORDER — EPOETIN ALFA 20000 UNIT/ML IJ SOLN: 40000.0000 [IU] | INTRAMUSCULAR | Status: DC

## 2011-05-15 ENCOUNTER — Encounter (HOSPITAL_COMMUNITY): Payer: Medicare Other

## 2011-05-21 ENCOUNTER — Encounter (HOSPITAL_COMMUNITY)
Admission: RE | Admit: 2011-05-21 | Discharge: 2011-05-21 | Disposition: A | Discharge status: Home / Self Care | Payer: Medicare Other | Source: Ambulatory Visit | Attending: Nephrology | Admitting: Nephrology

## 2011-05-21 DIAGNOSIS — N183 Chronic kidney disease, stage 3 unspecified: Secondary | ICD-10-CM | POA: Insufficient documentation

## 2011-05-21 DIAGNOSIS — D638 Anemia in other chronic diseases classified elsewhere: Secondary | ICD-10-CM | POA: Insufficient documentation

## 2011-05-21 LAB — IRON AND TIBC
Iron: 69 ug/dL (ref 42–135)
Saturation Ratios: 29 % (ref 20–55)
TIBC: 240 ug/dL (ref 215–435)
UIBC: 171 ug/dL (ref 125–400)

## 2011-05-21 MED ORDER — EPOETIN ALFA 40000 UNIT/ML IJ SOLN
INTRAMUSCULAR | Status: AC
  Administered 2011-05-21: 40000 [IU] via SUBCUTANEOUS
  Filled 2011-05-21: qty 1

## 2011-05-21 MED ORDER — EPOETIN ALFA 20000 UNIT/ML IJ SOLN: 40000.0000 [IU] | INTRAMUSCULAR | Status: DC

## 2011-06-03 ENCOUNTER — Other Ambulatory Visit (HOSPITAL_COMMUNITY): Payer: Self-pay | Admitting: *Deleted

## 2011-06-04 ENCOUNTER — Encounter (HOSPITAL_COMMUNITY)
Admission: RE | Admit: 2011-06-04 | Discharge: 2011-06-04 | Disposition: A | Discharge status: Home / Self Care | Payer: Medicare Other | Source: Ambulatory Visit | Attending: Nephrology | Admitting: Nephrology

## 2011-06-04 LAB — COMPREHENSIVE METABOLIC PANEL
Albumin: 3.7 g/dL (ref 3.5–5.2)
Alkaline Phosphatase: 99 U/L (ref 39–117)
BUN: 31 mg/dL — ABNORMAL HIGH (ref 6–23)
Calcium: 9.7 mg/dL (ref 8.4–10.5)
Creatinine, Ser: 1.95 mg/dL — ABNORMAL HIGH (ref 0.50–1.35)
GFR calc Af Amer: 39 mL/min — ABNORMAL LOW (ref >90)
Glucose, Bld: 88 mg/dL (ref 70–99)
Potassium: 5.3 mEq/L — ABNORMAL HIGH (ref 3.5–5.1)
Total Protein: 7.7 g/dL (ref 6.0–8.3)

## 2011-06-04 LAB — CBC
HCT: 36.3 % — ABNORMAL LOW (ref 39.0–52.0)
Hemoglobin: 11.4 g/dL — ABNORMAL LOW (ref 13.0–17.0)
MCV: 87.5 fL (ref 78.0–100.0)
RBC: 4.15 MIL/uL — ABNORMAL LOW (ref 4.22–5.81)
WBC: 5.7 10*3/uL (ref 4.0–10.5)

## 2011-06-04 LAB — PHOSPHORUS: Phosphorus: 3.7 mg/dL (ref 2.3–4.6)

## 2011-06-04 MED ORDER — EPOETIN ALFA 20000 UNIT/ML IJ SOLN: 40000.0000 [IU] | INTRAMUSCULAR | Status: DC

## 2011-06-04 MED ORDER — EPOETIN ALFA 40000 UNIT/ML IJ SOLN
INTRAMUSCULAR | Status: AC
  Administered 2011-06-04: 40000 [IU] via SUBCUTANEOUS
  Filled 2011-06-04: qty 1

## 2011-06-05 LAB — PTH, INTACT AND CALCIUM: Calcium, Total (PTH): 9.6 mg/dL (ref 8.4–10.5)

## 2011-06-18 ENCOUNTER — Other Ambulatory Visit (HOSPITAL_COMMUNITY): Payer: Self-pay | Admitting: *Deleted

## 2011-06-19 ENCOUNTER — Encounter (HOSPITAL_COMMUNITY)
Admission: RE | Admit: 2011-06-19 | Discharge: 2011-06-19 | Disposition: A | Discharge status: Home / Self Care | Payer: Medicare Other | Source: Ambulatory Visit | Attending: Nephrology | Admitting: Nephrology

## 2011-06-19 DIAGNOSIS — D638 Anemia in other chronic diseases classified elsewhere: Secondary | ICD-10-CM | POA: Insufficient documentation

## 2011-06-19 DIAGNOSIS — N183 Chronic kidney disease, stage 3 unspecified: Secondary | ICD-10-CM | POA: Insufficient documentation

## 2011-06-19 LAB — IRON AND TIBC
Iron: 81 ug/dL (ref 42–135)
Saturation Ratios: 29 % (ref 20–55)
TIBC: 282 ug/dL (ref 215–435)
UIBC: 201 ug/dL (ref 125–400)

## 2011-06-19 MED ORDER — EPOETIN ALFA 20000 UNIT/ML IJ SOLN: 40000.0000 [IU] | INTRAMUSCULAR | Status: DC

## 2011-07-03 ENCOUNTER — Encounter (HOSPITAL_COMMUNITY)
Admission: RE | Admit: 2011-07-03 | Discharge: 2011-07-03 | Disposition: A | Discharge status: Home / Self Care | Payer: Medicare Other | Source: Ambulatory Visit | Attending: Nephrology | Admitting: Nephrology

## 2011-07-03 LAB — POCT HEMOGLOBIN-HEMACUE: Hemoglobin: 12.7 g/dL — ABNORMAL LOW (ref 13.0–17.0)

## 2011-07-03 MED ORDER — EPOETIN ALFA 20000 UNIT/ML IJ SOLN: 40000.0000 [IU] | INTRAMUSCULAR | Status: DC

## 2011-07-17 ENCOUNTER — Encounter (HOSPITAL_COMMUNITY)
Admission: RE | Admit: 2011-07-17 | Discharge: 2011-07-17 | Disposition: A | Discharge status: Home / Self Care | Payer: Medicare Other | Source: Ambulatory Visit | Attending: Nephrology | Admitting: Nephrology

## 2011-07-17 DIAGNOSIS — D638 Anemia in other chronic diseases classified elsewhere: Secondary | ICD-10-CM | POA: Insufficient documentation

## 2011-07-17 DIAGNOSIS — N183 Chronic kidney disease, stage 3 unspecified: Secondary | ICD-10-CM | POA: Insufficient documentation

## 2011-07-17 LAB — FERRITIN: Ferritin: 773 ng/mL — ABNORMAL HIGH (ref 22–322)

## 2011-07-17 LAB — IRON AND TIBC
Saturation Ratios: 37 % (ref 20–55)
TIBC: 267 ug/dL (ref 215–435)

## 2011-07-17 LAB — POCT HEMOGLOBIN-HEMACUE: Hemoglobin: 10.8 g/dL — ABNORMAL LOW (ref 13.0–17.0)

## 2011-07-17 MED ORDER — EPOETIN ALFA 40000 UNIT/ML IJ SOLN
INTRAMUSCULAR | Status: AC
  Administered 2011-07-17: 40000 [IU] via SUBCUTANEOUS
  Filled 2011-07-17: qty 1

## 2011-07-17 MED ORDER — EPOETIN ALFA 20000 UNIT/ML IJ SOLN: 40000.0000 [IU] | INTRAMUSCULAR | Status: DC

## 2011-07-31 ENCOUNTER — Encounter (HOSPITAL_COMMUNITY)
Admission: RE | Admit: 2011-07-31 | Discharge: 2011-07-31 | Disposition: A | Discharge status: Home / Self Care | Payer: Medicare Other | Source: Ambulatory Visit | Attending: Anesthesiology | Admitting: Anesthesiology

## 2011-07-31 MED ORDER — EPOETIN ALFA 20000 UNIT/ML IJ SOLN: 40000.0000 [IU] | INTRAMUSCULAR | Status: DC

## 2011-08-05 LAB — POCT HEMOGLOBIN-HEMACUE: Hemoglobin: 11.1 g/dL — ABNORMAL LOW (ref 13.0–17.0)

## 2011-08-14 ENCOUNTER — Encounter (HOSPITAL_COMMUNITY)
Admission: RE | Admit: 2011-08-14 | Discharge: 2011-08-14 | Disposition: A | Discharge status: Home / Self Care | Payer: Medicare Other | Source: Ambulatory Visit | Attending: Nephrology | Admitting: Nephrology

## 2011-08-14 LAB — IRON AND TIBC: TIBC: 256 ug/dL (ref 215–435)

## 2011-08-14 LAB — POCT HEMOGLOBIN-HEMACUE: Hemoglobin: 10.8 g/dL — ABNORMAL LOW (ref 13.0–17.0)

## 2011-08-14 MED ORDER — EPOETIN ALFA 20000 UNIT/ML IJ SOLN: 40000.0000 [IU] | INTRAMUSCULAR | Status: DC

## 2011-08-14 MED ORDER — EPOETIN ALFA 40000 UNIT/ML IJ SOLN
INTRAMUSCULAR | Status: AC
  Administered 2011-08-14: 40000 [IU] via SUBCUTANEOUS
  Filled 2011-08-14: qty 1

## 2011-08-28 ENCOUNTER — Encounter (HOSPITAL_COMMUNITY)
Admission: RE | Admit: 2011-08-28 | Discharge: 2011-08-28 | Disposition: A | Discharge status: Home / Self Care | Payer: Medicare Other | Source: Ambulatory Visit | Attending: Nephrology | Admitting: Nephrology

## 2011-08-28 DIAGNOSIS — N183 Chronic kidney disease, stage 3 unspecified: Secondary | ICD-10-CM | POA: Insufficient documentation

## 2011-08-28 DIAGNOSIS — D638 Anemia in other chronic diseases classified elsewhere: Secondary | ICD-10-CM | POA: Insufficient documentation

## 2011-08-28 LAB — POCT HEMOGLOBIN-HEMACUE: Hemoglobin: 11.3 g/dL — ABNORMAL LOW (ref 13.0–17.0)

## 2011-08-28 MED ORDER — EPOETIN ALFA 20000 UNIT/ML IJ SOLN: 40000.0000 [IU] | INTRAMUSCULAR | Status: DC

## 2011-09-10 ENCOUNTER — Other Ambulatory Visit (HOSPITAL_COMMUNITY): Payer: Self-pay | Admitting: *Deleted

## 2011-09-11 ENCOUNTER — Encounter (HOSPITAL_COMMUNITY)
Admission: RE | Admit: 2011-09-11 | Discharge: 2011-09-11 | Disposition: A | Discharge status: Home / Self Care | Payer: Medicare Other | Source: Ambulatory Visit | Attending: Surgery | Admitting: Surgery

## 2011-09-11 LAB — POCT HEMOGLOBIN-HEMACUE: Hemoglobin: 11.5 g/dL — ABNORMAL LOW (ref 13.0–17.0)

## 2011-09-11 LAB — IRON AND TIBC
Saturation Ratios: 36 % (ref 20–55)
TIBC: 239 ug/dL (ref 215–435)
UIBC: 152 ug/dL (ref 125–400)

## 2011-09-11 MED ORDER — EPOETIN ALFA 20000 UNIT/ML IJ SOLN: 40000.0000 [IU] | INTRAMUSCULAR | Status: DC

## 2011-09-20 ENCOUNTER — Other Ambulatory Visit (HOSPITAL_COMMUNITY): Payer: Self-pay | Admitting: *Deleted

## 2011-09-25 ENCOUNTER — Encounter (HOSPITAL_COMMUNITY)
Admission: RE | Admit: 2011-09-25 | Discharge: 2011-09-25 | Disposition: A | Discharge status: Home / Self Care | Payer: Medicare Other | Source: Ambulatory Visit | Attending: Nephrology | Admitting: Nephrology

## 2011-09-25 DIAGNOSIS — D638 Anemia in other chronic diseases classified elsewhere: Secondary | ICD-10-CM | POA: Insufficient documentation

## 2011-09-25 DIAGNOSIS — N183 Chronic kidney disease, stage 3 unspecified: Secondary | ICD-10-CM | POA: Insufficient documentation

## 2011-09-25 LAB — COMPREHENSIVE METABOLIC PANEL
CO2: 16 mEq/L — ABNORMAL LOW (ref 19–32)
Calcium: 9.8 mg/dL (ref 8.4–10.5)
Creatinine, Ser: 2.49 mg/dL — ABNORMAL HIGH (ref 0.50–1.35)
GFR calc Af Amer: 29 mL/min — ABNORMAL LOW (ref >90)
GFR calc non Af Amer: 25 mL/min — ABNORMAL LOW (ref >90)
Glucose, Bld: 114 mg/dL — ABNORMAL HIGH (ref 70–99)
Sodium: 140 mEq/L (ref 135–145)
Total Protein: 7.8 g/dL (ref 6.0–8.3)

## 2011-09-25 LAB — CBC
Hemoglobin: 11.1 g/dL — ABNORMAL LOW (ref 13.0–17.0)
Platelets: 219 10*3/uL (ref 150–400)
RBC: 3.69 MIL/uL — ABNORMAL LOW (ref 4.22–5.81)
WBC: 9.2 10*3/uL (ref 4.0–10.5)

## 2011-09-25 LAB — POCT HEMOGLOBIN-HEMACUE: Hemoglobin: 11.1 g/dL — ABNORMAL LOW (ref 13.0–17.0)

## 2011-09-25 MED ORDER — EPOETIN ALFA 20000 UNIT/ML IJ SOLN: 40000.0000 [IU] | INTRAMUSCULAR | Status: DC

## 2011-10-08 ENCOUNTER — Other Ambulatory Visit (HOSPITAL_COMMUNITY): Payer: Self-pay | Admitting: *Deleted

## 2011-10-09 ENCOUNTER — Encounter (HOSPITAL_COMMUNITY)
Admission: RE | Admit: 2011-10-09 | Discharge: 2011-10-09 | Disposition: A | Discharge status: Home / Self Care | Payer: Medicare Other | Source: Ambulatory Visit | Attending: Nephrology | Admitting: Nephrology

## 2011-10-09 LAB — IRON AND TIBC
Iron: 96 ug/dL (ref 42–135)
Saturation Ratios: 40 % (ref 20–55)
TIBC: 242 ug/dL (ref 215–435)
UIBC: 146 ug/dL (ref 125–400)

## 2011-10-09 MED ORDER — EPOETIN ALFA 20000 UNIT/ML IJ SOLN: 40000.0000 [IU] | INTRAMUSCULAR | Status: DC

## 2011-10-09 MED ORDER — EPOETIN ALFA 40000 UNIT/ML IJ SOLN
INTRAMUSCULAR | Status: AC
  Administered 2011-10-09: 40000 [IU]
  Filled 2011-10-09: qty 1

## 2011-10-17 DIAGNOSIS — K922 Gastrointestinal hemorrhage, unspecified: Secondary | ICD-10-CM

## 2011-10-17 HISTORY — DX: Gastrointestinal hemorrhage, unspecified: K92.2

## 2011-11-06 ENCOUNTER — Encounter (HOSPITAL_COMMUNITY)
Admission: RE | Admit: 2011-11-06 | Discharge: 2011-11-06 | Disposition: A | Discharge status: Home / Self Care | Payer: Medicare Other | Source: Ambulatory Visit | Attending: Nephrology | Admitting: Nephrology

## 2011-11-06 DIAGNOSIS — D638 Anemia in other chronic diseases classified elsewhere: Secondary | ICD-10-CM | POA: Insufficient documentation

## 2011-11-06 DIAGNOSIS — N183 Chronic kidney disease, stage 3 unspecified: Secondary | ICD-10-CM | POA: Insufficient documentation

## 2011-11-06 LAB — FERRITIN: Ferritin: 1244 ng/mL — ABNORMAL HIGH (ref 22–322)

## 2011-11-06 LAB — IRON AND TIBC: Saturation Ratios: 57 % — ABNORMAL HIGH (ref 20–55)

## 2011-11-06 LAB — POCT HEMOGLOBIN-HEMACUE: Hemoglobin: 14 g/dL (ref 13.0–17.0)

## 2011-11-06 MED ORDER — EPOETIN ALFA 20000 UNIT/ML IJ SOLN: 40000.0000 [IU] | INTRAMUSCULAR | Status: DC

## 2011-11-27 ENCOUNTER — Encounter (HOSPITAL_COMMUNITY)
Admission: RE | Admit: 2011-11-27 | Discharge: 2011-11-27 | Disposition: A | Discharge status: Home / Self Care | Payer: Medicare Other | Source: Ambulatory Visit | Attending: Nephrology | Admitting: Nephrology

## 2011-11-27 DIAGNOSIS — N183 Chronic kidney disease, stage 3 unspecified: Secondary | ICD-10-CM | POA: Insufficient documentation

## 2011-11-27 DIAGNOSIS — D638 Anemia in other chronic diseases classified elsewhere: Secondary | ICD-10-CM | POA: Insufficient documentation

## 2011-11-27 LAB — POCT HEMOGLOBIN-HEMACUE: Hemoglobin: 11.7 g/dL — ABNORMAL LOW (ref 13.0–17.0)

## 2011-11-27 MED ORDER — EPOETIN ALFA 20000 UNIT/ML IJ SOLN: 40000.0000 [IU] | INTRAMUSCULAR | Status: DC

## 2011-12-09 ENCOUNTER — Other Ambulatory Visit (HOSPITAL_COMMUNITY): Payer: Self-pay | Admitting: *Deleted

## 2011-12-11 ENCOUNTER — Encounter (HOSPITAL_COMMUNITY)
Admission: RE | Admit: 2011-12-11 | Discharge: 2011-12-11 | Disposition: A | Discharge status: Home / Self Care | Payer: Medicare Other | Source: Ambulatory Visit | Attending: Nephrology | Admitting: Nephrology

## 2011-12-11 LAB — IRON AND TIBC
Saturation Ratios: 35 % (ref 20–55)
TIBC: 248 ug/dL (ref 215–435)

## 2011-12-11 LAB — RENAL FUNCTION PANEL
BUN: 38 mg/dL — ABNORMAL HIGH (ref 6–23)
CO2: 20 mEq/L (ref 19–32)
GFR calc Af Amer: 29 mL/min — ABNORMAL LOW (ref >90)
Glucose, Bld: 103 mg/dL — ABNORMAL HIGH (ref 70–99)
Phosphorus: 2.8 mg/dL (ref 2.3–4.6)
Potassium: 4.9 mEq/L (ref 3.5–5.1)
Sodium: 139 mEq/L (ref 135–145)

## 2011-12-11 LAB — POCT HEMOGLOBIN-HEMACUE: Hemoglobin: 10.6 g/dL — ABNORMAL LOW (ref 13.0–17.0)

## 2011-12-11 MED ORDER — EPOETIN ALFA 40000 UNIT/ML IJ SOLN
INTRAMUSCULAR | Status: AC
  Administered 2011-12-11: 40000 [IU]
  Filled 2011-12-11: qty 1

## 2011-12-11 MED ORDER — EPOETIN ALFA 20000 UNIT/ML IJ SOLN: 40000.0000 [IU] | INTRAMUSCULAR | Status: DC

## 2012-01-08 ENCOUNTER — Encounter (HOSPITAL_COMMUNITY)
Admission: RE | Admit: 2012-01-08 | Discharge: 2012-01-08 | Disposition: A | Discharge status: Home / Self Care | Payer: Medicare Other | Source: Ambulatory Visit | Attending: Nephrology | Admitting: Nephrology

## 2012-01-08 DIAGNOSIS — N183 Chronic kidney disease, stage 3 unspecified: Secondary | ICD-10-CM | POA: Insufficient documentation

## 2012-01-08 DIAGNOSIS — D638 Anemia in other chronic diseases classified elsewhere: Secondary | ICD-10-CM | POA: Insufficient documentation

## 2012-01-08 LAB — IRON AND TIBC
Iron: 118 ug/dL (ref 42–135)
Saturation Ratios: 44 % (ref 20–55)
TIBC: 269 ug/dL (ref 215–435)

## 2012-01-08 LAB — FERRITIN: Ferritin: 758 ng/mL — ABNORMAL HIGH (ref 22–322)

## 2012-01-08 MED ORDER — EPOETIN ALFA 20000 UNIT/ML IJ SOLN: 40000.0000 [IU] | INTRAMUSCULAR | Status: DC

## 2012-01-21 ENCOUNTER — Other Ambulatory Visit (HOSPITAL_COMMUNITY): Payer: Self-pay | Admitting: *Deleted

## 2012-01-22 ENCOUNTER — Encounter (HOSPITAL_COMMUNITY)
Admission: RE | Admit: 2012-01-22 | Discharge: 2012-01-22 | Disposition: A | Discharge status: Home / Self Care | Payer: Medicare Other | Source: Ambulatory Visit | Attending: Nephrology | Admitting: Nephrology

## 2012-01-22 DIAGNOSIS — N183 Chronic kidney disease, stage 3 unspecified: Secondary | ICD-10-CM | POA: Insufficient documentation

## 2012-01-22 DIAGNOSIS — D638 Anemia in other chronic diseases classified elsewhere: Secondary | ICD-10-CM | POA: Insufficient documentation

## 2012-01-22 MED ORDER — EPOETIN ALFA 20000 UNIT/ML IJ SOLN: 40000.0000 [IU] | INTRAMUSCULAR | Status: DC

## 2012-02-19 ENCOUNTER — Encounter (HOSPITAL_COMMUNITY)
Admission: RE | Admit: 2012-02-19 | Discharge: 2012-02-19 | Disposition: A | Discharge status: Home / Self Care | Payer: Medicare Other | Source: Ambulatory Visit | Attending: Nephrology | Admitting: Nephrology

## 2012-02-19 DIAGNOSIS — D638 Anemia in other chronic diseases classified elsewhere: Secondary | ICD-10-CM | POA: Insufficient documentation

## 2012-02-19 DIAGNOSIS — N183 Chronic kidney disease, stage 3 unspecified: Secondary | ICD-10-CM | POA: Insufficient documentation

## 2012-02-19 MED ORDER — EPOETIN ALFA 40000 UNIT/ML IJ SOLN
INTRAMUSCULAR | Status: AC
  Administered 2012-02-19: 10:00:00
  Filled 2012-02-19: qty 1

## 2012-02-19 MED ORDER — EPOETIN ALFA 20000 UNIT/ML IJ SOLN: 40000.0000 [IU] | INTRAMUSCULAR | Status: DC

## 2012-02-19 MED ORDER — EPOETIN ALFA 20000 UNIT/ML IJ SOLN
INTRAMUSCULAR | Status: AC
  Filled 2012-02-19: qty 1

## 2012-03-19 ENCOUNTER — Encounter (HOSPITAL_COMMUNITY)
Admission: RE | Admit: 2012-03-19 | Discharge: 2012-03-19 | Disposition: A | Discharge status: Home / Self Care | Payer: Medicare Other | Source: Ambulatory Visit | Attending: Nephrology | Admitting: Nephrology

## 2012-03-19 DIAGNOSIS — N183 Chronic kidney disease, stage 3 unspecified: Secondary | ICD-10-CM | POA: Insufficient documentation

## 2012-03-19 DIAGNOSIS — D638 Anemia in other chronic diseases classified elsewhere: Secondary | ICD-10-CM | POA: Insufficient documentation

## 2012-03-19 LAB — POCT HEMOGLOBIN-HEMACUE: Hemoglobin: 10.5 g/dL — ABNORMAL LOW (ref 13.0–17.0)

## 2012-03-19 MED ORDER — EPOETIN ALFA 40000 UNIT/ML IJ SOLN
INTRAMUSCULAR | Status: AC
  Administered 2012-03-19: 40000 [IU] via SUBCUTANEOUS
  Filled 2012-03-19: qty 1

## 2012-03-19 MED ORDER — EPOETIN ALFA 20000 UNIT/ML IJ SOLN: 40000.0000 [IU] | INTRAMUSCULAR | Status: DC

## 2012-04-14 ENCOUNTER — Other Ambulatory Visit (HOSPITAL_COMMUNITY): Payer: Self-pay | Admitting: *Deleted

## 2012-04-15 ENCOUNTER — Encounter (HOSPITAL_COMMUNITY): Payer: Medicare Other

## 2012-04-22 ENCOUNTER — Encounter (HOSPITAL_COMMUNITY)
Admission: RE | Admit: 2012-04-22 | Discharge: 2012-04-22 | Disposition: A | Discharge status: Home / Self Care | Payer: Medicare Other | Source: Ambulatory Visit | Attending: Nephrology | Admitting: Nephrology

## 2012-04-22 DIAGNOSIS — N183 Chronic kidney disease, stage 3 unspecified: Secondary | ICD-10-CM | POA: Insufficient documentation

## 2012-04-22 DIAGNOSIS — D638 Anemia in other chronic diseases classified elsewhere: Secondary | ICD-10-CM | POA: Insufficient documentation

## 2012-04-22 LAB — IRON AND TIBC
Iron: 142 ug/dL — ABNORMAL HIGH (ref 42–135)
Saturation Ratios: 51 % (ref 20–55)
TIBC: 279 ug/dL (ref 215–435)
UIBC: 137 ug/dL (ref 125–400)

## 2012-04-22 LAB — RENAL FUNCTION PANEL
Albumin: 3.7 g/dL (ref 3.5–5.2)
Calcium: 9.3 mg/dL (ref 8.4–10.5)
Creatinine, Ser: 2.38 mg/dL — ABNORMAL HIGH (ref 0.50–1.35)
GFR calc Af Amer: 30 mL/min — ABNORMAL LOW (ref >90)
GFR calc non Af Amer: 26 mL/min — ABNORMAL LOW (ref >90)
Phosphorus: 2.6 mg/dL (ref 2.3–4.6)
Sodium: 136 mEq/L (ref 135–145)

## 2012-04-22 MED ORDER — EPOETIN ALFA 20000 UNIT/ML IJ SOLN: 40000.0000 [IU] | INTRAMUSCULAR | Status: DC

## 2012-05-05 ENCOUNTER — Other Ambulatory Visit (HOSPITAL_COMMUNITY): Payer: Self-pay | Admitting: *Deleted

## 2012-05-06 ENCOUNTER — Ambulatory Visit (INDEPENDENT_AMBULATORY_CARE_PROVIDER_SITE_OTHER): Payer: Medicare Other | Admitting: *Deleted

## 2012-05-06 ENCOUNTER — Encounter (HOSPITAL_COMMUNITY)
Admission: RE | Admit: 2012-05-06 | Discharge: 2012-05-06 | Disposition: A | Discharge status: Home / Self Care | Payer: Medicare Other | Source: Ambulatory Visit | Attending: Nephrology | Admitting: Nephrology

## 2012-05-06 DIAGNOSIS — I739 Peripheral vascular disease, unspecified: Secondary | ICD-10-CM

## 2012-05-06 LAB — POCT HEMOGLOBIN-HEMACUE: Hemoglobin: 11.1 g/dL — ABNORMAL LOW (ref 13.0–17.0)

## 2012-05-26 ENCOUNTER — Other Ambulatory Visit: Payer: Self-pay | Admitting: *Deleted

## 2012-06-03 ENCOUNTER — Encounter (HOSPITAL_COMMUNITY)
Admission: RE | Admit: 2012-06-03 | Discharge: 2012-06-03 | Disposition: A | Discharge status: Home / Self Care | Payer: Medicare Other | Source: Ambulatory Visit | Attending: Nephrology | Admitting: Nephrology

## 2012-06-03 DIAGNOSIS — N183 Chronic kidney disease, stage 3 unspecified: Secondary | ICD-10-CM | POA: Insufficient documentation

## 2012-06-03 DIAGNOSIS — D638 Anemia in other chronic diseases classified elsewhere: Secondary | ICD-10-CM | POA: Insufficient documentation

## 2012-06-03 MED ORDER — EPOETIN ALFA 20000 UNIT/ML IJ SOLN: 40000.0000 [IU] | INTRAMUSCULAR | Status: DC

## 2012-06-09 ENCOUNTER — Encounter: Payer: Self-pay | Admitting: Vascular Surgery

## 2012-06-10 ENCOUNTER — Encounter: Payer: Self-pay | Admitting: Vascular Surgery

## 2012-06-10 ENCOUNTER — Encounter (INDEPENDENT_AMBULATORY_CARE_PROVIDER_SITE_OTHER): Payer: Medicare Other | Admitting: *Deleted

## 2012-06-10 ENCOUNTER — Ambulatory Visit (INDEPENDENT_AMBULATORY_CARE_PROVIDER_SITE_OTHER): Payer: Medicare Other | Admitting: Vascular Surgery

## 2012-06-10 VITALS — BP 147/63 | HR 52 | Ht 68.0 in | Wt 208.0 lb

## 2012-06-10 DIAGNOSIS — I739 Peripheral vascular disease, unspecified: Secondary | ICD-10-CM

## 2012-06-10 DIAGNOSIS — I70219 Atherosclerosis of native arteries of extremities with intermittent claudication, unspecified extremity: Secondary | ICD-10-CM | POA: Insufficient documentation

## 2012-06-10 NOTE — Progress Notes (Signed)
[facility] [place]  Patient name: Roger [name] MRN: [id number] DOB: March 20, 1940 Sex: male  REASON FOR CONSULT: claudication. Referred by Dr. Salem Senate.  HPI: Roger [name] is a 72 y.o. male who states that he has had bilateral lower extremity calf pain for over a year. His daughter actually states that his symptoms began back in 2010. He experiences pain in both calves which is brought on by ambulation and relieved with rest. He can walk approximately one block. He has no thigh or hip claudication. There are no other aggravating or alleviating factors. He symptoms have been relatively stable over the last 6 months. He denies any history of rest pain or nonhealing ulcers.  He denies any history of premature cardiovascular disease. His risk factors for peripheral vascular disease include hypertension and continued tobacco use. He denies any history of diabetes, hypercholesterolemia, or history of premature cardiovascular disease in the family.  I have reviewed his records from Dr. Delfino Lovett Fox's office. He does have high blood pressure which is under well controlled. He also has a history of prostate cancer and is followed by Dr. Irine Seal. He has mild chronic renal insufficiency stage 3-4.  Past Medical History  Diagnosis Date  . Gout   . Renal insufficiency   . Prostate cancer   . Anemia     Family History  Problem Relation Age of Onset  . Cancer Mother   . Cancer Father   . Heart attack Father   . Cancer Sister   . Cancer Brother   . Diabetes Brother   . Hypertension Brother   . Hypertension Daughter     SOCIAL HISTORY: History  Substance Use Topics  . Smoking status: Current Every Day Smoker -- 0.50 packs/day for 50 years    Types: Cigarettes  . Smokeless tobacco: Never Used  . Alcohol Use: No    No Known Allergies  Current Outpatient Prescriptions  Medication Sig Dispense Refill  . tamsulosin (FLOMAX) 0.4 MG CAPS Take 0.4 mg by mouth daily.       Marland Kitchen telmisartan (MICARDIS) 40 MG tablet Take 40 mg by mouth daily.        Marland Kitchen ULORIC 80 MG TABS Take 1 tablet by mouth daily.      . predniSONE (DELTASONE) 10 MG tablet Take 2 tablets (20 mg total) by mouth daily.  8 tablet  0   No current facility-administered medications for this visit.    REVIEW OF SYSTEMS: Valu.Nieves ] denotes positive finding; [  ] denotes negative finding  CARDIOVASCULAR:  [ ]  chest pain   [ ]  chest pressure   [ ]  palpitations   [ ]  orthopnea   Valu.Nieves ] dyspnea on exertion   Valu.Nieves ] claudication   [ ]  rest pain   [ ]  DVT   [ ]  phlebitis PULMONARY:   [ ]  productive cough   [ ]  asthma   [ ]  wheezing NEUROLOGIC:   [ ]  weakness  [ ]  paresthesias  [ ]  aphasia  [ ]  amaurosis  [ ]  dizziness HEMATOLOGIC:   [ ]  bleeding problems   [ ]  clotting disorders MUSCULOSKELETAL:  [ ]  joint pain   [ ]  joint swelling [ ]  leg swelling GASTROINTESTINAL: [ ]   blood in stool  [ ]   hematemesis GENITOURINARY:  [ ]   dysuria  [ ]   hematuria PSYCHIATRIC:  [ ]  history of major depression INTEGUMENTARY:  [ ]  rashes  [ ]  ulcers CONSTITUTIONAL:  [ ]   fever   [ ]  chills  PHYSICAL EXAM: Filed Vitals:   06/10/12 1029  BP: 147/63  Pulse: 52  Height: 5\' 8"  (1.727 m)  Weight: 208 lb (94.348 kg)  SpO2: 98%   Body mass index is 31.63 kg/(m^2). GENERAL: The patient is a well-nourished male, in no acute distress. The vital signs are documented above. CARDIOVASCULAR: There is a regular rate and rhythm. I do not detect carotid bruits. He has palpable femoral pulses bilaterally. I cannot palpate popliteal or pedal pulses. The feet are warm and appear adequately perfused. He has no significant lower extremity swelling. PULMONARY: There is good air exchange bilaterally without wheezing or rales. ABDOMEN: Soft and non-tender with normal pitched bowel sounds.  MUSCULOSKELETAL: There are no major deformities or cyanosis. NEUROLOGIC: No focal weakness or paresthesias are detected. SKIN: There are no ulcers or rashes  noted. PSYCHIATRIC: The patient has a normal affect.  DATA:  I reviewed his previous Doppler study from February 2014 which showed a monophasic right posterior tibial signal with a biphasic right dorsalis pedis signal. ABI on the right was 61%. On the left side he had monophasic signals in the posterior tibial artery and dorsalis pedis artery with an ABI of 61%.  I have independently interpreted his duplex scan today which shows triphasic right common femoral artery waveforms with a biphasic left common femoral artery waveform. Does appear be some stenosis in the left deep femoral artery proximally. He has monophasic signals below that consistent with infrainguinal arterial occlusive disease.  MEDICAL ISSUES:  Atherosclerosis of native arteries of the extremities with intermittent claudication This patient has stable bilateral lower extremity claudication. Based on his duplex scan and physical examination, he has bilateral infrainguinal arterial occlusive disease. We have had a long discussion today about the importance of tobacco cessation. Also discussed the importance of a structured walking program. I've explained that we would typically not consider arteriography and revascularization unless he developed disabling symptoms, rest pain, or nonhealing ulcer. Given his mild renal insufficiency if he did require arteriography we would likely need to do this with CO2. Based on his exam it is unlikely that he has any significant iliac disease and more likely has infrainguinal arterial occlusive disease which is not amenable to angioplasty. I'll plan on seeing him back in 6 months with follow up ABIs. He knows to call sooner if he has problems.   [place] [facility] [place] Beeper: [phone]

## 2012-06-10 NOTE — Assessment & Plan Note (Signed)
This patient has stable bilateral lower extremity claudication. Based on his duplex scan and physical examination, he has bilateral infrainguinal arterial occlusive disease. We have had a long discussion today about the importance of tobacco cessation. Also discussed the importance of a structured walking program. I've explained that we would typically not consider arteriography and revascularization unless he developed disabling symptoms, rest pain, or nonhealing ulcer. Given his mild renal insufficiency if he did require arteriography we would likely need to do this with CO2. Based on his exam it is unlikely that he has any significant iliac disease and more likely has infrainguinal arterial occlusive disease which is not amenable to angioplasty. I'll plan on seeing him back in 6 months with follow up ABIs. He knows to call sooner if he has problems.

## 2012-06-10 NOTE — Addendum Note (Signed)
Addended by: Dorthula Rue L on: 06/10/2012 11:58 AM   Modules accepted: Orders

## 2012-07-01 ENCOUNTER — Encounter (HOSPITAL_COMMUNITY): Payer: Medicare Other

## 2012-07-01 ENCOUNTER — Encounter (HOSPITAL_COMMUNITY)
Admission: RE | Admit: 2012-07-01 | Discharge: 2012-07-01 | Disposition: A | Discharge status: Home / Self Care | Payer: Medicare Other | Source: Ambulatory Visit | Attending: Nephrology | Admitting: Nephrology

## 2012-07-01 DIAGNOSIS — D638 Anemia in other chronic diseases classified elsewhere: Secondary | ICD-10-CM | POA: Insufficient documentation

## 2012-07-01 DIAGNOSIS — N183 Chronic kidney disease, stage 3 unspecified: Secondary | ICD-10-CM | POA: Insufficient documentation

## 2012-07-01 LAB — POCT HEMOGLOBIN-HEMACUE: Hemoglobin: 10.3 g/dL — ABNORMAL LOW (ref 13.0–17.0)

## 2012-07-01 LAB — IRON AND TIBC: Saturation Ratios: 38 % (ref 20–55)

## 2012-07-01 MED ORDER — EPOETIN ALFA 40000 UNIT/ML IJ SOLN
INTRAMUSCULAR | Status: AC
  Administered 2012-07-01: 40000 [IU] via SUBCUTANEOUS
  Filled 2012-07-01: qty 1

## 2012-07-01 MED ORDER — EPOETIN ALFA 20000 UNIT/ML IJ SOLN: 40000.0000 [IU] | INTRAMUSCULAR | Status: DC

## 2012-07-28 ENCOUNTER — Other Ambulatory Visit (HOSPITAL_COMMUNITY): Payer: Self-pay | Admitting: *Deleted

## 2012-07-29 ENCOUNTER — Encounter (HOSPITAL_COMMUNITY)
Admission: RE | Admit: 2012-07-29 | Discharge: 2012-07-29 | Disposition: A | Discharge status: Home / Self Care | Payer: Medicare Other | Source: Ambulatory Visit | Attending: Nephrology | Admitting: Nephrology

## 2012-07-29 DIAGNOSIS — N183 Chronic kidney disease, stage 3 unspecified: Secondary | ICD-10-CM | POA: Insufficient documentation

## 2012-07-29 DIAGNOSIS — D638 Anemia in other chronic diseases classified elsewhere: Secondary | ICD-10-CM | POA: Insufficient documentation

## 2012-07-29 LAB — RENAL FUNCTION PANEL
Calcium: 8.8 mg/dL (ref 8.4–10.5)
Creatinine, Ser: 3.32 mg/dL — ABNORMAL HIGH (ref 0.50–1.35)
GFR calc Af Amer: 20 mL/min — ABNORMAL LOW (ref >90)
Glucose, Bld: 109 mg/dL — ABNORMAL HIGH (ref 70–99)
Phosphorus: 2.2 mg/dL — ABNORMAL LOW (ref 2.3–4.6)
Sodium: 136 mEq/L (ref 135–145)

## 2012-07-29 LAB — IRON AND TIBC
Iron: 108 ug/dL (ref 42–135)
TIBC: 248 ug/dL (ref 215–435)
UIBC: 140 ug/dL (ref 125–400)

## 2012-07-29 MED ORDER — EPOETIN ALFA 40000 UNIT/ML IJ SOLN
INTRAMUSCULAR | Status: AC
  Administered 2012-07-29: 40000 [IU] via SUBCUTANEOUS
  Filled 2012-07-29: qty 1

## 2012-07-29 MED ORDER — EPOETIN ALFA 20000 UNIT/ML IJ SOLN: 40000.0000 [IU] | INTRAMUSCULAR | Status: DC

## 2012-08-26 ENCOUNTER — Encounter (HOSPITAL_COMMUNITY)
Admission: RE | Admit: 2012-08-26 | Discharge: 2012-08-26 | Disposition: A | Discharge status: Home / Self Care | Payer: Medicare Other | Source: Ambulatory Visit | Attending: Nephrology | Admitting: Nephrology

## 2012-08-26 DIAGNOSIS — D638 Anemia in other chronic diseases classified elsewhere: Secondary | ICD-10-CM | POA: Insufficient documentation

## 2012-08-26 DIAGNOSIS — N183 Chronic kidney disease, stage 3 unspecified: Secondary | ICD-10-CM | POA: Insufficient documentation

## 2012-08-26 LAB — POCT HEMOGLOBIN-HEMACUE: Hemoglobin: 10.7 g/dL — ABNORMAL LOW (ref 13.0–17.0)

## 2012-08-26 LAB — IRON AND TIBC
Iron: 95 ug/dL (ref 42–135)
Saturation Ratios: 38 % (ref 20–55)
UIBC: 154 ug/dL (ref 125–400)

## 2012-08-26 MED ORDER — EPOETIN ALFA 20000 UNIT/ML IJ SOLN
40000.0000 [IU] | INTRAMUSCULAR | Status: DC
  Administered 2012-08-26: 40000 [IU] via SUBCUTANEOUS

## 2012-08-26 MED ORDER — EPOETIN ALFA 40000 UNIT/ML IJ SOLN
INTRAMUSCULAR | Status: AC
  Filled 2012-08-26: qty 1

## 2012-08-27 MED FILL — Epoetin Alfa Inj 40000 Unit/ML: INTRAMUSCULAR | Qty: 1 | Status: AC

## 2012-09-23 ENCOUNTER — Encounter (HOSPITAL_COMMUNITY)
Admission: RE | Admit: 2012-09-23 | Discharge: 2012-09-23 | Disposition: A | Discharge status: Home / Self Care | Payer: Medicare Other | Source: Ambulatory Visit | Attending: Nephrology | Admitting: Nephrology

## 2012-09-23 DIAGNOSIS — N183 Chronic kidney disease, stage 3 unspecified: Secondary | ICD-10-CM | POA: Insufficient documentation

## 2012-09-23 DIAGNOSIS — D638 Anemia in other chronic diseases classified elsewhere: Secondary | ICD-10-CM | POA: Insufficient documentation

## 2012-09-23 LAB — IRON AND TIBC
Iron: 106 ug/dL (ref 42–135)
UIBC: 147 ug/dL (ref 125–400)

## 2012-09-23 MED ORDER — EPOETIN ALFA 20000 UNIT/ML IJ SOLN: 40000.0000 [IU] | INTRAMUSCULAR | Status: DC

## 2012-10-06 ENCOUNTER — Other Ambulatory Visit (HOSPITAL_COMMUNITY): Payer: Self-pay | Admitting: *Deleted

## 2012-10-07 ENCOUNTER — Encounter (HOSPITAL_COMMUNITY)
Admission: RE | Admit: 2012-10-07 | Discharge: 2012-10-07 | Disposition: A | Discharge status: Home / Self Care | Payer: Medicare Other | Source: Ambulatory Visit | Attending: Nephrology | Admitting: Nephrology

## 2012-10-07 MED ORDER — EPOETIN ALFA 20000 UNIT/ML IJ SOLN: 40000.0000 [IU] | INTRAMUSCULAR | Status: DC

## 2012-10-21 ENCOUNTER — Encounter (HOSPITAL_COMMUNITY)
Admission: RE | Admit: 2012-10-21 | Discharge: 2012-10-21 | Disposition: A | Discharge status: Home / Self Care | Payer: Medicare Other | Source: Ambulatory Visit | Attending: Nephrology | Admitting: Nephrology

## 2012-10-21 DIAGNOSIS — N183 Chronic kidney disease, stage 3 unspecified: Secondary | ICD-10-CM | POA: Insufficient documentation

## 2012-10-21 DIAGNOSIS — D638 Anemia in other chronic diseases classified elsewhere: Secondary | ICD-10-CM | POA: Insufficient documentation

## 2012-10-21 LAB — POCT HEMOGLOBIN-HEMACUE: Hemoglobin: 10.6 g/dL — ABNORMAL LOW (ref 13.0–17.0)

## 2012-10-21 LAB — IRON AND TIBC
Saturation Ratios: 36 % (ref 20–55)
TIBC: 245 ug/dL (ref 215–435)
UIBC: 157 ug/dL (ref 125–400)

## 2012-10-21 MED ORDER — EPOETIN ALFA 20000 UNIT/ML IJ SOLN: 40000.0000 [IU] | INTRAMUSCULAR | Status: DC

## 2012-10-21 MED ORDER — EPOETIN ALFA 40000 UNIT/ML IJ SOLN
INTRAMUSCULAR | Status: AC
  Administered 2012-10-21: 40000 [IU] via SUBCUTANEOUS
  Filled 2012-10-21: qty 1

## 2012-11-18 ENCOUNTER — Encounter (HOSPITAL_COMMUNITY)
Admission: RE | Admit: 2012-11-18 | Discharge: 2012-11-18 | Disposition: A | Discharge status: Home / Self Care | Payer: Medicare Other | Source: Ambulatory Visit | Attending: Nephrology | Admitting: Nephrology

## 2012-11-18 DIAGNOSIS — N183 Chronic kidney disease, stage 3 unspecified: Secondary | ICD-10-CM | POA: Insufficient documentation

## 2012-11-18 DIAGNOSIS — D638 Anemia in other chronic diseases classified elsewhere: Secondary | ICD-10-CM | POA: Insufficient documentation

## 2012-11-18 LAB — IRON AND TIBC
Iron: 99 ug/dL (ref 42–135)
TIBC: 245 ug/dL (ref 215–435)
UIBC: 146 ug/dL (ref 125–400)

## 2012-11-18 LAB — POCT HEMOGLOBIN-HEMACUE: Hemoglobin: 10.9 g/dL — ABNORMAL LOW (ref 13.0–17.0)

## 2012-11-18 MED ORDER — EPOETIN ALFA 40000 UNIT/ML IJ SOLN
40000.0000 [IU] | Freq: Once | INTRAMUSCULAR | Status: AC
  Administered 2012-11-18: 40000 [IU] via SUBCUTANEOUS

## 2012-11-18 MED ORDER — EPOETIN ALFA 40000 UNIT/ML IJ SOLN
INTRAMUSCULAR | Status: AC
  Administered 2012-11-18: 40000 [IU] via SUBCUTANEOUS
  Filled 2012-11-18: qty 1

## 2012-12-08 ENCOUNTER — Encounter: Payer: Self-pay | Admitting: Vascular Surgery

## 2012-12-09 ENCOUNTER — Encounter (INDEPENDENT_AMBULATORY_CARE_PROVIDER_SITE_OTHER): Payer: Medicare Other

## 2012-12-09 ENCOUNTER — Encounter: Payer: Self-pay | Admitting: Vascular Surgery

## 2012-12-09 ENCOUNTER — Ambulatory Visit (INDEPENDENT_AMBULATORY_CARE_PROVIDER_SITE_OTHER): Payer: Medicare Other | Admitting: Vascular Surgery

## 2012-12-09 VITALS — BP 178/76 | HR 100 | Resp 18 | Ht 67.0 in | Wt 223.0 lb

## 2012-12-09 DIAGNOSIS — I739 Peripheral vascular disease, unspecified: Secondary | ICD-10-CM

## 2012-12-09 DIAGNOSIS — I70219 Atherosclerosis of native arteries of extremities with intermittent claudication, unspecified extremity: Secondary | ICD-10-CM

## 2012-12-09 NOTE — Progress Notes (Signed)
Vascular and Vein Specialist of [facility]  Patient name: Roger [name] MRN: [id number] DOB: 11-02-1940 Sex: male  REASON FOR VISIT: follow up of peripheral vascular disease  HPI: Roger [name] is a 72 y.o. male who I last saw him in March of this year. He has bilateral infrainguinal arterial occlusive disease. Since I saw him last, he continues to have bilateral lower extremity calf claudication. His symptoms are brought on by ambulation and relieved with rest. His symptoms have been stable since March. He denies any history of rest pain or history of nonhealing ulcers.  He does have a history of some mild renal insufficiency but states that his kidney function has been stable.  Past Medical History  Diagnosis Date  . Gout   . Renal insufficiency   . Prostate cancer   . Anemia   . Peripheral vascular disease    Family History  Problem Relation Age of Onset  . Cancer Mother   . Cancer Father   . Heart attack Father   . Cancer Sister   . Cancer Brother   . Diabetes Brother   . Hypertension Brother   . Hypertension Daughter    SOCIAL HISTORY: History  Substance Use Topics  . Smoking status: Current Every Day Smoker -- 0.50 packs/day for 50 years    Types: Cigarettes  . Smokeless tobacco: Never Used  . Alcohol Use: No   No Known Allergies  Current Outpatient Prescriptions  Medication Sig Dispense Refill  . Esomeprazole Magnesium (NEXIUM PO) Take 22.3 mg by mouth.      . tamsulosin (FLOMAX) 0.4 MG CAPS Take 0.4 mg by mouth daily.      Marland Kitchen telmisartan (MICARDIS) 40 MG tablet Take 40 mg by mouth daily.        Marland Kitchen ULORIC 80 MG TABS Take 1 tablet by mouth daily.      . predniSONE (DELTASONE) 10 MG tablet Take 2 tablets (20 mg total) by mouth daily.  8 tablet  0   No current facility-administered medications for this visit.   REVIEW OF SYSTEMS: Valu.Nieves ] denotes positive finding; [  ] denotes negative finding  CARDIOVASCULAR:  [ ]  chest pain   [ ]  chest pressure   [ ]  palpitations    [ ]  orthopnea   [ ]  dyspnea on exertion   Valu.Nieves ] claudication   [ ]  rest pain   [ ]  DVT   [ ]  phlebitis PULMONARY:   [ ]  productive cough   [ ]  asthma   [ ]  wheezing NEUROLOGIC:   [ ]  weakness  [ ]  paresthesias  [ ]  aphasia  [ ]  amaurosis  [ ]  dizziness HEMATOLOGIC:   [ ]  bleeding problems   [ ]  clotting disorders MUSCULOSKELETAL:  [ ]  joint pain   [ ]  joint swelling [ ]  leg swelling GASTROINTESTINAL: [ ]   blood in stool  [ ]   hematemesis GENITOURINARY:  [ ]   dysuria  [ ]   hematuria PSYCHIATRIC:  [ ]  history of major depression INTEGUMENTARY:  [ ]  rashes  [ ]  ulcers CONSTITUTIONAL:  [ ]  fever   [ ]  chills  PHYSICAL EXAM: Filed Vitals:   12/09/12 1636  BP: 178/76  Pulse: 100  Resp: 18  Height: 5\' 7"  (1.702 m)  Weight: 223 lb (101.152 kg)   Body mass index is 34.92 kg/(m^2). GENERAL: The patient is a well-nourished male, in no acute distress. The vital signs are documented above. CARDIOVASCULAR: There is a regular rate and rhythm.  I do not detect carotid bruits. He has palpable femoral pulses. I cannot palpate popliteal or pedal pulses. Both feet appear adequately perfused without ischemic ulcers. PULMONARY: There is good air exchange bilaterally without wheezing or rales. ABDOMEN: Soft and non-tender with normal pitched bowel sounds.  MUSCULOSKELETAL: There are no major deformities or cyanosis. NEUROLOGIC: No focal weakness or paresthesias are detected. SKIN: There are no ulcers or rashes noted. PSYCHIATRIC: The patient has a normal affect.  DATA:  I have independently interpreted his arterial Doppler study. He has monophasic Doppler signals in the dorsalis pedis and posterior tibial positions bilaterally. ABI on the right is 51% with a toe pressure of 73 mm of mercury. ABI on the left is 50% with a toe pressure of 61 mm of mercury. These ABIs are down slightly compared to the study in March.  MEDICAL ISSUES:  Atherosclerosis of native arteries of the extremities with intermittent  claudication His claudication symptoms are stable. His ABIs are stable. We have again discussed the importance of tobacco cessation. I've also encouraged him to get on a structured walking program. I've explained that his symptoms could very likely improve with these 2 measures although this will take time. Would not recommend arteriography and revascularization unless he developed disabling claudication, rest pain, or nonhealing ulcer. I'll see him back in one year with follow up ABIs. He knows to call sooner if he has problems.   [place] [facility] [place] Beeper: [phone]

## 2012-12-09 NOTE — Assessment & Plan Note (Signed)
His claudication symptoms are stable. His ABIs are stable. We have again discussed the importance of tobacco cessation. I've also encouraged him to get on a structured walking program. I've explained that his symptoms could very likely improve with these 2 measures although this will take time. Would not recommend arteriography and revascularization unless he developed disabling claudication, rest pain, or nonhealing ulcer. I'll see him back in one year with follow up ABIs. He knows to call sooner if he has problems.

## 2012-12-16 ENCOUNTER — Encounter (HOSPITAL_COMMUNITY): Payer: Medicare Other

## 2012-12-18 ENCOUNTER — Other Ambulatory Visit: Payer: Self-pay | Admitting: *Deleted

## 2012-12-18 ENCOUNTER — Encounter (HOSPITAL_COMMUNITY)
Admission: RE | Admit: 2012-12-18 | Discharge: 2012-12-18 | Disposition: A | Discharge status: Home / Self Care | Payer: Medicare Other | Source: Ambulatory Visit | Attending: Nephrology | Admitting: Nephrology

## 2012-12-18 DIAGNOSIS — I70219 Atherosclerosis of native arteries of extremities with intermittent claudication, unspecified extremity: Secondary | ICD-10-CM

## 2012-12-18 DIAGNOSIS — D638 Anemia in other chronic diseases classified elsewhere: Secondary | ICD-10-CM | POA: Insufficient documentation

## 2012-12-18 DIAGNOSIS — N183 Chronic kidney disease, stage 3 unspecified: Secondary | ICD-10-CM | POA: Insufficient documentation

## 2012-12-18 LAB — IRON AND TIBC: Iron: 103 ug/dL (ref 42–135)

## 2012-12-18 MED ORDER — EPOETIN ALFA 20000 UNIT/ML IJ SOLN: 40000.0000 [IU] | INTRAMUSCULAR | Status: DC

## 2012-12-18 MED ORDER — EPOETIN ALFA 40000 UNIT/ML IJ SOLN
INTRAMUSCULAR | Status: AC
  Administered 2012-12-18: 40000 [IU] via SUBCUTANEOUS
  Filled 2012-12-18: qty 1

## 2013-01-12 ENCOUNTER — Other Ambulatory Visit (HOSPITAL_COMMUNITY): Payer: Self-pay | Admitting: *Deleted

## 2013-01-13 ENCOUNTER — Encounter (HOSPITAL_COMMUNITY): Payer: Medicare Other

## 2013-12-15 ENCOUNTER — Ambulatory Visit: Payer: Medicare Other | Admitting: Vascular Surgery

## 2013-12-15 ENCOUNTER — Encounter (HOSPITAL_COMMUNITY): Payer: Medicare Other

## 2013-12-28 ENCOUNTER — Encounter: Payer: Self-pay | Admitting: Vascular Surgery

## 2013-12-29 ENCOUNTER — Ambulatory Visit (HOSPITAL_COMMUNITY)
Admission: RE | Admit: 2013-12-29 | Discharge: 2013-12-29 | Disposition: A | Discharge status: Home / Self Care | Payer: Medicare Other | Source: Ambulatory Visit | Attending: Vascular Surgery | Admitting: Vascular Surgery

## 2013-12-29 ENCOUNTER — Encounter: Payer: Self-pay | Admitting: Vascular Surgery

## 2013-12-29 ENCOUNTER — Ambulatory Visit (INDEPENDENT_AMBULATORY_CARE_PROVIDER_SITE_OTHER): Payer: Medicare Other | Admitting: Vascular Surgery

## 2013-12-29 VITALS — BP 132/73 | HR 79 | Ht 67.0 in | Wt 220.0 lb

## 2013-12-29 DIAGNOSIS — I70219 Atherosclerosis of native arteries of extremities with intermittent claudication, unspecified extremity: Secondary | ICD-10-CM | POA: Diagnosis present

## 2013-12-29 DIAGNOSIS — I739 Peripheral vascular disease, unspecified: Secondary | ICD-10-CM

## 2013-12-29 DIAGNOSIS — I10 Essential (primary) hypertension: Secondary | ICD-10-CM | POA: Insufficient documentation

## 2013-12-29 DIAGNOSIS — F17210 Nicotine dependence, cigarettes, uncomplicated: Secondary | ICD-10-CM | POA: Diagnosis not present

## 2013-12-29 NOTE — Progress Notes (Signed)
Vascular and Vein Specialist of [facility]  Patient name: Roger [name] MRN: [id number] DOB: 13-Dec-1940 Sex: male  REASON FOR VISIT: Follow up of peripheral vascular disease.  HPI: Roger [name] is a 73 y.o. male who I last saw in September of 2014. He has a history of bilateral infrainguinal arterial occlusive disease with bilateral lower extremity claudication. wwhen I saw him a year ago, ABI on the right was 51% and ABI on the left was 50%. His symptoms were stable at that time. We discussed the importance of tobacco cessation and I encouraged him to get on a structured walking program. He comes back for a one year follow up visit.  He continues to have stable bilateral calf claudication. These symptoms may have improved slightly over the last year. He denies any history of rest pain or history of nonhealing ulcers. He also complains of some right hip pain. This is aggravated by walking somewhat but also occurs somewhat simply with standing. He also experiences pain in the morning when he awakens.  He continues to smoke approximately 6 cigarettes a day.  Past Medical History  Diagnosis Date  . Gout   . Renal insufficiency   . Prostate cancer   . Anemia   . Peripheral vascular disease   . Atrial fibrillation   . Myocardial infarction    Family History  Problem Relation Age of Onset  . Cancer Mother   . Cancer Father   . Heart attack Father   . Diabetes Father   . Hypertension Father   . Cancer Sister   . Cancer Brother   . Diabetes Brother   . Hypertension Brother   . Hypertension Daughter    SOCIAL HISTORY: History  Substance Use Topics  . Smoking status: Current Every Day Smoker -- 0.50 packs/day for 50 years    Types: Cigarettes  . Smokeless tobacco: Never Used  . Alcohol Use: No   No Known Allergies Current Outpatient Prescriptions  Medication Sig Dispense Refill  . amLODipine (NORVASC) 10 MG tablet Take 10 mg by mouth daily.      . calcitRIOL (ROCALTROL)  0.25 MCG capsule Take 0.25 mcg by mouth daily.      . Esomeprazole Magnesium (NEXIUM PO) Take 22.3 mg by mouth.      . tamsulosin (FLOMAX) 0.4 MG CAPS Take 0.4 mg by mouth daily.      Marland Kitchen ULORIC 80 MG TABS Take 1 tablet by mouth daily.      . predniSONE (DELTASONE) 10 MG tablet Take 2 tablets (20 mg total) by mouth daily.  8 tablet  0  . telmisartan (MICARDIS) 40 MG tablet Take 40 mg by mouth daily.         No current facility-administered medications for this visit.   REVIEW OF SYSTEMS: Valu.Nieves ] denotes positive finding; [  ] denotes negative finding  CARDIOVASCULAR:  [ ]  chest pain   [ ]  chest pressure   [ ]  palpitations   [ ]  orthopnea   [ ]  dyspnea on exertion   Valu.Nieves ] claudication   [ ]  rest pain   [ ]  DVT   [ ]  phlebitis PULMONARY:   [ ]  productive cough   [ ]  asthma   [ ]  wheezing NEUROLOGIC:   [ ]  weakness  [ ]  paresthesias  [ ]  aphasia  [ ]  amaurosis  [ ]  dizziness HEMATOLOGIC:   [ ]  bleeding problems   [ ]  clotting disorders MUSCULOSKELETAL:  [ ]  joint pain   [ ]   joint swelling [ ]  leg swelling GASTROINTESTINAL: [ ]   blood in stool  [ ]   hematemesis GENITOURINARY:  [ ]   dysuria  [ ]   hematuria PSYCHIATRIC:  [ ]  history of major depression INTEGUMENTARY:  [ ]  rashes  [ ]  ulcers CONSTITUTIONAL:  [ ]  fever   [ ]  chills  PHYSICAL EXAM: Filed Vitals:   12/29/13 1540  BP: 132/73  Pulse: 79  Height: 5\' 7"  (1.702 m)  Weight: 220 lb (99.791 kg)  SpO2: 100%   Body mass index is 34.45 kg/(m^2). GENERAL: The patient is a well-nourished male, in no acute distress. The vital signs are documented above. CARDIOVASCULAR: There is a regular rate and rhythm. I do not detect carotid bruits. He has palpable femoral pulses. I cannot palpate popliteal or pedal pulses. Both feet appear adequately perfused. He has no significant lower extremity swelling. PULMONARY: There is good air exchange bilaterally without wheezing or rales. ABDOMEN: Soft and non-tender with normal pitched bowel sounds.    MUSCULOSKELETAL: There are no major deformities or cyanosis. NEUROLOGIC: No focal weakness or paresthesias are detected. SKIN: There are no ulcers or rashes noted. PSYCHIATRIC: The patient has a normal affect.  DATA:  I have independently interpreted his arterial Doppler study today which shows monophasic Doppler signals in the dorsalis pedis and posterior tibial positions bilaterally. ABI on the right is 56%. ABI on the left is 57%.  MEDICAL ISSUES:  Atherosclerotic PVD with intermittent claudication The patient has stable bilateral lower extremity claudication. He has evidence of infrainguinal arterial occlusive disease bilaterally. I do not think that he has significant inflow disease. Likely his right hip pain is related to arthritis. I have again encouraged him to put smoking. I have encouraged him to stay as active as possible. We discussed the role of nutrition also today. I'll see him back in one year with follow up ABIs. He knows to call sooner if he has problems.    Return in about 1 year (around 12/30/2014).   [place] [facility] [place] Beeper: [phone]

## 2013-12-29 NOTE — Assessment & Plan Note (Signed)
The patient has stable bilateral lower extremity claudication. He has evidence of infrainguinal arterial occlusive disease bilaterally. I do not think that he has significant inflow disease. Likely his right hip pain is related to arthritis. I have again encouraged him to put smoking. I have encouraged him to stay as active as possible. We discussed the role of nutrition also today. I'll see him back in one year with follow up ABIs. He knows to call sooner if he has problems.

## 2013-12-29 NOTE — Patient Instructions (Signed)
Intermittent Claudication Blockage of leg arteries results from poor circulation of blood in the leg arteries. This produces an aching, tired, and sometimes burning pain in the legs that is brought on by exercise and made better by rest. Claudication refers to the limping that happens from leg cramps. It is also referred to as Vaso-occlusive disease of the legs, arterial insufficiency of the legs, recurrent leg pain, recurrent leg cramping and calf pain with exercise.  CAUSES  This condition is due to narrowing or blockage of the arteries (muscular vessels which carry blood away from the heart and around the body). Blockage of arteries can occur anywhere in the body. If they occur in the heart, a person may experience angina (chest pain) or even a heart attack. If they occur in the neck or the brain, a person may have a stroke. Intermittent claudication is when the blockage occurs in the legs, most commonly in the calf or the foot.  Atherosclerosis, or blockage of arteries, can occur for many reasons. Some of these are smoking, diabetes, and high cholesterol. SYMPTOMS  Intermittent claudication may occur in both legs, and it often continues to get worse over time. However, some people complain only of weakness in the legs when walking, or a feeling of "tiredness" in the buttocks. Impotence (not able to have an erection) is an occasional complaint in men. Pain while resting is uncommon.  WHAT TO EXPECT AT YOUR HEALTH CARE PROVIDER'S OFFICE: Your medical history will be asked for and a physical examination will be performed. Medical history questions documenting claudication in detail may include:   Time pattern  Do you have leg cramps at night (nocturnal cramps)?  How often does leg pain with cramping occur?  Is it getting worse?  What is the quality of the pain?  Is the pain sharp?  Is there an aching pain with the cramps?  Aggravating factors  Is it worse after you exercise?  Is it  worse after you are standing for a while?  Do you smoke? How much?  Do you drink alcohol? How much?  Are you diabetic? How well is your blood sugar controlled?  Other  What other symptoms are also present?  Has there been impotence (men)?  Is there pain in the back?  Is there a darkening of the skin of the legs, feet or toes?  Is there weakness or paralysis of the legs? The physical examination may include evaluation of the femoral pulse (in the groin) and the other areas where the pulse can be felt in the legs. DIAGNOSIS  Diagnostic tests that may be performed include:  Blood pressure measured in arms and legs for comparison.  Doppler ultrasonography on the legs and the heart.  Duplex Doppler/ultrasound exam of extremity to visualize arterial blood flow.  ECG- to evaluate the activity of your heart.  Aortography- to visualize blockages in your arteries. TREATMENT Surgical treatment may be suggested if claudication interferes with the patient's activities or work, and if the diseased arteries do not seem to be improving after treatment. Be aware that this condition can worsen over time and you should carefully monitor your condition. HOME CARE INSTRUCTIONS  Talk to your caregiver about the cause of your leg cramping and about what to do at home to relieve it.  A healthy diet is important to lessen the likeliness of atherosclerosis.  A program of daily walking for short periods, and stopping for pain or cramping, may help improve function.  It is important to   stop smoking.  Avoid putting hot or cold items on legs.  Avoid tight shoes. SEEK MEDICAL CARE IF: There are many other causes of leg pain such as arthritis or low blood potassium. However, some causes of leg pain may be life threatening such as a blood clot in the legs. Seek medical attention if you have:  Leg pain that does not go away.  Legs that may be red, hot or swollen.  Ulcers or sores appear on your  ankle or foot.  Any chest pain or shortness of breath accompanying leg pain.  Diabetes. SEEK IMMEDIATE MEDICAL CARE IF:   Your leg pain becomes severe or will not go away.  Your foot turns blue or a dark color.  Your leg becomes red, hot or swollen or you develop a fever over 102F.  Any chest pain or shortness of breath accompanying leg pain. MAKE SURE YOU:   Understand these instructions.  Will watch your condition.  Will get help right away if you are not doing well or get worse. Document Released: 01/05/2004 Document Revised: 05/27/2011 Document Reviewed: 06/10/2013 [facility] Patient Information 2015 [place], [place]. This information is not intended to replace advice given to you by your health care provider. Make sure you discuss any questions you have with your health care provider.

## 2014-01-08 ENCOUNTER — Other Ambulatory Visit: Payer: Self-pay | Admitting: Nurse Practitioner

## 2014-04-20 DIAGNOSIS — M5136 Other intervertebral disc degeneration, lumbar region: Secondary | ICD-10-CM | POA: Diagnosis not present

## 2014-04-20 DIAGNOSIS — M1A09X0 Idiopathic chronic gout, multiple sites, without tophus (tophi): Secondary | ICD-10-CM | POA: Diagnosis not present

## 2014-04-20 DIAGNOSIS — N189 Chronic kidney disease, unspecified: Secondary | ICD-10-CM | POA: Diagnosis not present

## 2014-04-20 DIAGNOSIS — M549 Dorsalgia, unspecified: Secondary | ICD-10-CM | POA: Diagnosis not present

## 2014-05-16 DIAGNOSIS — M549 Dorsalgia, unspecified: Secondary | ICD-10-CM | POA: Diagnosis not present

## 2014-05-16 DIAGNOSIS — M5416 Radiculopathy, lumbar region: Secondary | ICD-10-CM | POA: Diagnosis not present

## 2014-06-08 DIAGNOSIS — I129 Hypertensive chronic kidney disease with stage 1 through stage 4 chronic kidney disease, or unspecified chronic kidney disease: Secondary | ICD-10-CM | POA: Diagnosis not present

## 2014-07-05 DIAGNOSIS — D649 Anemia, unspecified: Secondary | ICD-10-CM | POA: Diagnosis not present

## 2014-07-05 DIAGNOSIS — I10 Essential (primary) hypertension: Secondary | ICD-10-CM | POA: Diagnosis not present

## 2014-07-05 DIAGNOSIS — N184 Chronic kidney disease, stage 4 (severe): Secondary | ICD-10-CM | POA: Diagnosis not present

## 2014-07-05 DIAGNOSIS — N2581 Secondary hyperparathyroidism of renal origin: Secondary | ICD-10-CM | POA: Diagnosis not present

## 2014-07-20 DIAGNOSIS — I129 Hypertensive chronic kidney disease with stage 1 through stage 4 chronic kidney disease, or unspecified chronic kidney disease: Secondary | ICD-10-CM | POA: Diagnosis not present

## 2014-07-20 DIAGNOSIS — N289 Disorder of kidney and ureter, unspecified: Secondary | ICD-10-CM | POA: Diagnosis not present

## 2014-07-20 DIAGNOSIS — I10 Essential (primary) hypertension: Secondary | ICD-10-CM | POA: Diagnosis not present

## 2014-07-20 DIAGNOSIS — E0800 Diabetes mellitus due to underlying condition with hyperosmolarity without nonketotic hyperglycemic-hyperosmolar coma (NKHHC): Secondary | ICD-10-CM | POA: Diagnosis not present

## 2014-07-20 DIAGNOSIS — E785 Hyperlipidemia, unspecified: Secondary | ICD-10-CM | POA: Diagnosis not present

## 2014-07-27 DIAGNOSIS — I129 Hypertensive chronic kidney disease with stage 1 through stage 4 chronic kidney disease, or unspecified chronic kidney disease: Secondary | ICD-10-CM | POA: Diagnosis not present

## 2014-07-27 DIAGNOSIS — E782 Mixed hyperlipidemia: Secondary | ICD-10-CM | POA: Diagnosis not present

## 2014-07-27 DIAGNOSIS — F99 Mental disorder, not otherwise specified: Secondary | ICD-10-CM | POA: Diagnosis not present

## 2014-09-01 DIAGNOSIS — C61 Malignant neoplasm of prostate: Secondary | ICD-10-CM | POA: Diagnosis not present

## 2014-09-08 DIAGNOSIS — N3941 Urge incontinence: Secondary | ICD-10-CM | POA: Diagnosis not present

## 2014-09-08 DIAGNOSIS — N138 Other obstructive and reflux uropathy: Secondary | ICD-10-CM | POA: Diagnosis not present

## 2014-09-08 DIAGNOSIS — N401 Enlarged prostate with lower urinary tract symptoms: Secondary | ICD-10-CM | POA: Diagnosis not present

## 2014-09-08 DIAGNOSIS — C61 Malignant neoplasm of prostate: Secondary | ICD-10-CM | POA: Diagnosis not present

## 2014-12-22 DIAGNOSIS — N2581 Secondary hyperparathyroidism of renal origin: Secondary | ICD-10-CM | POA: Diagnosis not present

## 2014-12-22 DIAGNOSIS — N184 Chronic kidney disease, stage 4 (severe): Secondary | ICD-10-CM | POA: Diagnosis not present

## 2015-01-04 ENCOUNTER — Encounter (HOSPITAL_COMMUNITY): Payer: Medicare Other

## 2015-01-04 ENCOUNTER — Ambulatory Visit: Payer: Medicare Other | Admitting: Vascular Surgery

## 2015-01-06 ENCOUNTER — Encounter: Payer: Self-pay | Admitting: Vascular Surgery

## 2015-01-11 ENCOUNTER — Encounter (HOSPITAL_COMMUNITY): Payer: Self-pay

## 2015-01-11 ENCOUNTER — Ambulatory Visit: Payer: Self-pay | Admitting: Vascular Surgery

## 2015-01-30 DIAGNOSIS — N184 Chronic kidney disease, stage 4 (severe): Secondary | ICD-10-CM | POA: Diagnosis not present

## 2015-01-30 DIAGNOSIS — I10 Essential (primary) hypertension: Secondary | ICD-10-CM | POA: Diagnosis not present

## 2015-01-30 DIAGNOSIS — N2581 Secondary hyperparathyroidism of renal origin: Secondary | ICD-10-CM | POA: Diagnosis not present

## 2015-03-09 DIAGNOSIS — C61 Malignant neoplasm of prostate: Secondary | ICD-10-CM | POA: Diagnosis not present

## 2015-03-16 DIAGNOSIS — N3941 Urge incontinence: Secondary | ICD-10-CM | POA: Diagnosis not present

## 2015-03-16 DIAGNOSIS — N401 Enlarged prostate with lower urinary tract symptoms: Secondary | ICD-10-CM | POA: Diagnosis not present

## 2015-03-16 DIAGNOSIS — N289 Disorder of kidney and ureter, unspecified: Secondary | ICD-10-CM | POA: Diagnosis not present

## 2015-03-16 DIAGNOSIS — C61 Malignant neoplasm of prostate: Secondary | ICD-10-CM | POA: Diagnosis not present

## 2015-03-16 DIAGNOSIS — N138 Other obstructive and reflux uropathy: Secondary | ICD-10-CM | POA: Diagnosis not present

## 2015-07-17 DIAGNOSIS — D649 Anemia, unspecified: Secondary | ICD-10-CM | POA: Diagnosis not present

## 2015-07-17 DIAGNOSIS — N184 Chronic kidney disease, stage 4 (severe): Secondary | ICD-10-CM | POA: Diagnosis not present

## 2015-07-27 DIAGNOSIS — N184 Chronic kidney disease, stage 4 (severe): Secondary | ICD-10-CM | POA: Diagnosis not present

## 2015-07-27 DIAGNOSIS — N2581 Secondary hyperparathyroidism of renal origin: Secondary | ICD-10-CM | POA: Diagnosis not present

## 2015-07-27 DIAGNOSIS — I10 Essential (primary) hypertension: Secondary | ICD-10-CM | POA: Diagnosis not present

## 2016-10-16 HISTORY — PX: CARDIAC CATHETERIZATION: SHX172

## 2016-10-18 ENCOUNTER — Emergency Department (HOSPITAL_COMMUNITY): Payer: Medicare Other

## 2016-10-18 ENCOUNTER — Encounter (HOSPITAL_COMMUNITY): Payer: Self-pay | Admitting: Emergency Medicine

## 2016-10-18 ENCOUNTER — Other Ambulatory Visit (HOSPITAL_COMMUNITY): Payer: Self-pay

## 2016-10-18 ENCOUNTER — Inpatient Hospital Stay (HOSPITAL_COMMUNITY): Payer: Medicare Other

## 2016-10-18 ENCOUNTER — Other Ambulatory Visit: Payer: Self-pay

## 2016-10-18 ENCOUNTER — Inpatient Hospital Stay (HOSPITAL_COMMUNITY)
Admission: EM | Admit: 2016-10-18 | Discharge: 2016-10-26 | DRG: 682 | Disposition: A | Discharge status: Home Health | Payer: Medicare Other | Attending: Internal Medicine | Admitting: Internal Medicine

## 2016-10-18 DIAGNOSIS — I440 Atrioventricular block, first degree: Secondary | ICD-10-CM | POA: Diagnosis present

## 2016-10-18 DIAGNOSIS — D631 Anemia in chronic kidney disease: Secondary | ICD-10-CM | POA: Diagnosis not present

## 2016-10-18 DIAGNOSIS — I2510 Atherosclerotic heart disease of native coronary artery without angina pectoris: Secondary | ICD-10-CM | POA: Diagnosis present

## 2016-10-18 DIAGNOSIS — D500 Iron deficiency anemia secondary to blood loss (chronic): Secondary | ICD-10-CM | POA: Diagnosis not present

## 2016-10-18 DIAGNOSIS — N186 End stage renal disease: Secondary | ICD-10-CM

## 2016-10-18 DIAGNOSIS — E860 Dehydration: Secondary | ICD-10-CM | POA: Diagnosis not present

## 2016-10-18 DIAGNOSIS — K5730 Diverticulosis of large intestine without perforation or abscess without bleeding: Secondary | ICD-10-CM | POA: Diagnosis not present

## 2016-10-18 DIAGNOSIS — D122 Benign neoplasm of ascending colon: Secondary | ICD-10-CM | POA: Diagnosis not present

## 2016-10-18 DIAGNOSIS — N179 Acute kidney failure, unspecified: Secondary | ICD-10-CM | POA: Diagnosis present

## 2016-10-18 DIAGNOSIS — D62 Acute posthemorrhagic anemia: Secondary | ICD-10-CM | POA: Diagnosis present

## 2016-10-18 DIAGNOSIS — R0789 Other chest pain: Secondary | ICD-10-CM

## 2016-10-18 DIAGNOSIS — K921 Melena: Secondary | ICD-10-CM | POA: Diagnosis not present

## 2016-10-18 DIAGNOSIS — R748 Abnormal levels of other serum enzymes: Secondary | ICD-10-CM | POA: Diagnosis not present

## 2016-10-18 DIAGNOSIS — K625 Hemorrhage of anus and rectum: Secondary | ICD-10-CM | POA: Diagnosis not present

## 2016-10-18 DIAGNOSIS — D124 Benign neoplasm of descending colon: Secondary | ICD-10-CM | POA: Diagnosis not present

## 2016-10-18 DIAGNOSIS — D120 Benign neoplasm of cecum: Secondary | ICD-10-CM | POA: Diagnosis not present

## 2016-10-18 DIAGNOSIS — I21A1 Myocardial infarction type 2: Secondary | ICD-10-CM | POA: Diagnosis not present

## 2016-10-18 DIAGNOSIS — R778 Other specified abnormalities of plasma proteins: Secondary | ICD-10-CM | POA: Diagnosis present

## 2016-10-18 DIAGNOSIS — K219 Gastro-esophageal reflux disease without esophagitis: Secondary | ICD-10-CM | POA: Diagnosis not present

## 2016-10-18 DIAGNOSIS — R7989 Other specified abnormal findings of blood chemistry: Secondary | ICD-10-CM | POA: Diagnosis not present

## 2016-10-18 DIAGNOSIS — K5520 Angiodysplasia of colon without hemorrhage: Secondary | ICD-10-CM

## 2016-10-18 DIAGNOSIS — I739 Peripheral vascular disease, unspecified: Secondary | ICD-10-CM | POA: Diagnosis not present

## 2016-10-18 DIAGNOSIS — R918 Other nonspecific abnormal finding of lung field: Secondary | ICD-10-CM | POA: Diagnosis not present

## 2016-10-18 DIAGNOSIS — I2582 Chronic total occlusion of coronary artery: Secondary | ICD-10-CM | POA: Diagnosis not present

## 2016-10-18 DIAGNOSIS — N185 Chronic kidney disease, stage 5: Secondary | ICD-10-CM

## 2016-10-18 DIAGNOSIS — K922 Gastrointestinal hemorrhage, unspecified: Secondary | ICD-10-CM | POA: Diagnosis not present

## 2016-10-18 DIAGNOSIS — I4891 Unspecified atrial fibrillation: Secondary | ICD-10-CM | POA: Diagnosis present

## 2016-10-18 DIAGNOSIS — I248 Other forms of acute ischemic heart disease: Secondary | ICD-10-CM | POA: Diagnosis not present

## 2016-10-18 DIAGNOSIS — Z01810 Encounter for preprocedural cardiovascular examination: Secondary | ICD-10-CM | POA: Diagnosis not present

## 2016-10-18 DIAGNOSIS — I70213 Atherosclerosis of native arteries of extremities with intermittent claudication, bilateral legs: Secondary | ICD-10-CM | POA: Diagnosis present

## 2016-10-18 DIAGNOSIS — I219 Acute myocardial infarction, unspecified: Secondary | ICD-10-CM

## 2016-10-18 DIAGNOSIS — Z452 Encounter for adjustment and management of vascular access device: Secondary | ICD-10-CM | POA: Diagnosis not present

## 2016-10-18 DIAGNOSIS — I2489 Other forms of acute ischemic heart disease: Secondary | ICD-10-CM

## 2016-10-18 DIAGNOSIS — E739 Lactose intolerance, unspecified: Secondary | ICD-10-CM | POA: Diagnosis present

## 2016-10-18 DIAGNOSIS — Z8546 Personal history of malignant neoplasm of prostate: Secondary | ICD-10-CM

## 2016-10-18 DIAGNOSIS — E669 Obesity, unspecified: Secondary | ICD-10-CM | POA: Diagnosis present

## 2016-10-18 DIAGNOSIS — Z86718 Personal history of other venous thrombosis and embolism: Secondary | ICD-10-CM

## 2016-10-18 DIAGNOSIS — Z95828 Presence of other vascular implants and grafts: Secondary | ICD-10-CM

## 2016-10-18 DIAGNOSIS — R079 Chest pain, unspecified: Secondary | ICD-10-CM | POA: Diagnosis not present

## 2016-10-18 DIAGNOSIS — Z720 Tobacco use: Secondary | ICD-10-CM | POA: Diagnosis not present

## 2016-10-18 DIAGNOSIS — F17210 Nicotine dependence, cigarettes, uncomplicated: Secondary | ICD-10-CM | POA: Diagnosis present

## 2016-10-18 DIAGNOSIS — E875 Hyperkalemia: Secondary | ICD-10-CM | POA: Diagnosis not present

## 2016-10-18 DIAGNOSIS — I200 Unstable angina: Secondary | ICD-10-CM | POA: Diagnosis present

## 2016-10-18 DIAGNOSIS — Z9289 Personal history of other medical treatment: Secondary | ICD-10-CM

## 2016-10-18 DIAGNOSIS — Z8249 Family history of ischemic heart disease and other diseases of the circulatory system: Secondary | ICD-10-CM

## 2016-10-18 DIAGNOSIS — R109 Unspecified abdominal pain: Secondary | ICD-10-CM | POA: Diagnosis not present

## 2016-10-18 DIAGNOSIS — I2584 Coronary atherosclerosis due to calcified coronary lesion: Secondary | ICD-10-CM | POA: Diagnosis present

## 2016-10-18 DIAGNOSIS — I209 Angina pectoris, unspecified: Secondary | ICD-10-CM | POA: Diagnosis present

## 2016-10-18 DIAGNOSIS — D649 Anemia, unspecified: Secondary | ICD-10-CM | POA: Diagnosis not present

## 2016-10-18 DIAGNOSIS — Z833 Family history of diabetes mellitus: Secondary | ICD-10-CM

## 2016-10-18 DIAGNOSIS — I1311 Hypertensive heart and chronic kidney disease without heart failure, with stage 5 chronic kidney disease, or end stage renal disease: Secondary | ICD-10-CM | POA: Diagnosis not present

## 2016-10-18 DIAGNOSIS — M109 Gout, unspecified: Secondary | ICD-10-CM | POA: Diagnosis not present

## 2016-10-18 DIAGNOSIS — R8299 Other abnormal findings in urine: Secondary | ICD-10-CM | POA: Diagnosis not present

## 2016-10-18 DIAGNOSIS — Z419 Encounter for procedure for purposes other than remedying health state, unspecified: Secondary | ICD-10-CM

## 2016-10-18 DIAGNOSIS — Z992 Dependence on renal dialysis: Secondary | ICD-10-CM

## 2016-10-18 DIAGNOSIS — Z6831 Body mass index (BMI) 31.0-31.9, adult: Secondary | ICD-10-CM

## 2016-10-18 DIAGNOSIS — I120 Hypertensive chronic kidney disease with stage 5 chronic kidney disease or end stage renal disease: Secondary | ICD-10-CM | POA: Diagnosis not present

## 2016-10-18 DIAGNOSIS — I214 Non-ST elevation (NSTEMI) myocardial infarction: Secondary | ICD-10-CM | POA: Diagnosis not present

## 2016-10-18 HISTORY — DX: Acute myocardial infarction, unspecified: I21.9

## 2016-10-18 HISTORY — DX: Personal history of other medical treatment: Z92.89

## 2016-10-18 HISTORY — DX: Gastro-esophageal reflux disease without esophagitis: K21.9

## 2016-10-18 LAB — CBC
HCT: 19.4 % — ABNORMAL LOW (ref 39.0–52.0)
Hemoglobin: 6.2 g/dL — CL (ref 13.0–17.0)
MCH: 29.2 pg (ref 26.0–34.0)
MCHC: 32 g/dL (ref 30.0–36.0)
MCV: 91.5 fL (ref 78.0–100.0)
Platelets: 230 10*3/uL (ref 150–400)
RBC: 2.12 MIL/uL — AB (ref 4.22–5.81)
RDW: 17.7 % — ABNORMAL HIGH (ref 11.5–15.5)
WBC: 7.5 10*3/uL (ref 4.0–10.5)

## 2016-10-18 LAB — BASIC METABOLIC PANEL
Anion gap: 9 (ref 5–15)
BUN: 49 mg/dL — ABNORMAL HIGH (ref 6–20)
CALCIUM: 8.5 mg/dL — AB (ref 8.9–10.3)
CO2: 16 mmol/L — ABNORMAL LOW (ref 22–32)
CREATININE: 5.74 mg/dL — AB (ref 0.61–1.24)
Chloride: 114 mmol/L — ABNORMAL HIGH (ref 101–111)
GFR, EST AFRICAN AMERICAN: 10 mL/min — AB (ref >60)
GFR, EST NON AFRICAN AMERICAN: 9 mL/min — AB (ref >60)
Glucose, Bld: 122 mg/dL — ABNORMAL HIGH (ref 65–99)
Potassium: 5.6 mmol/L — ABNORMAL HIGH (ref 3.5–5.1)
SODIUM: 139 mmol/L (ref 135–145)

## 2016-10-18 LAB — POCT I-STAT TROPONIN I: Troponin i, poc: 2.31 ng/mL (ref 0.00–0.08)

## 2016-10-18 LAB — PREPARE RBC (CROSSMATCH)

## 2016-10-18 MED ORDER — ONDANSETRON HCL 4 MG/2ML IJ SOLN: 4.0000 mg | Freq: Once | INTRAMUSCULAR | Status: DC

## 2016-10-18 MED ORDER — SODIUM CHLORIDE 0.9 % IV SOLN
Freq: Once | INTRAVENOUS | Status: AC
  Administered 2016-10-18: via INTRAVENOUS

## 2016-10-18 MED ORDER — ATORVASTATIN CALCIUM 80 MG PO TABS
80.0000 mg | ORAL_TABLET | Freq: Every day | ORAL | Status: DC
  Administered 2016-10-19 – 2016-10-25 (×7): 80 mg via ORAL
  Filled 2016-10-18 (×7): qty 1

## 2016-10-18 MED ORDER — CARVEDILOL 3.125 MG PO TABS
3.1250 mg | ORAL_TABLET | Freq: Two times a day (BID) | ORAL | Status: DC
  Administered 2016-10-19: 3.125 mg via ORAL
  Filled 2016-10-18: qty 1

## 2016-10-18 MED ORDER — IOPAMIDOL (ISOVUE-300) INJECTION 61%
INTRAVENOUS | Status: AC
  Filled 2016-10-18: qty 30

## 2016-10-18 NOTE — H&P (Addendum)
TRH H&P   Patient Demographics:    Roger [name], is a 76 y.o. male  MRN: [id number]   DOB - 12-13-1940  Admit Date - 10/18/2016  Outpatient Primary MD for the patient is [name], MD   Referring MD/NP/PA:   Ronalee Red Outpatient Specialists: [name], [name] (nephrology), Irine Seal (urology)   Patient coming from: home  Chief Complaint  Patient presents with  . Abdominal Pain  . Chest Pain      HPI:    Roger [name]  is a 76 y.o. male, w CKD stage4/5 PVD, Pafib ?,  Anemia, apparently went out to eat and then c/o nv, no bloody emesis. + diaphoresis,  + burning pain in chest for a while, Pt notes blood in stool (black stool) and epigastric discomfort for 1-2 months.  Denies NSAIDS.  Pt brought to ED by daughter to be evaluated  In ED, pt found to have Hgb 6.2, Bun/Creat 49/5.74, K=5.6  , Glucose 122, Trop 2.31,  EKG nsr at 90, nl axis, st depression v2-6.  ED contacted cardiology who will consult, thought that this was related to demand ischemia.  pt will be admitted for ARF on CRF, and Anemia and hyperkalemia.     Review of systems:    In addition to the HPI above,  No Fever-chills, No Headache, No changes with Vision or hearing, No problems swallowing food or Liquids, No  Cough or Shortness of Breath, No Abdominal pain,  No Blood in  Urine, No dysuria, No new skin rashes or bruises, No new joints pains-aches,  No new weakness, tingling, numbness in any extremity, No recent weight gain or loss, No polyuria, polydypsia or polyphagia, No significant Mental Stressors.  A full 10 point Review of Systems was done, except as stated above, all other Review of Systems were negative.   With Past History of the following :    Past Medical History:  Diagnosis Date  . Anemia   . Atrial fibrillation ([place])   . GERD (gastroesophageal reflux disease)     . Gout   . Myocardial infarction ([place])   . Peripheral vascular disease ([place])   . Prostate cancer ([place])   . Renal insufficiency       History reviewed. No pertinent surgical history.    Social History:     Social History  Substance Use Topics  . Smoking status: Current Every Day Smoker    Packs/day: 0.50    Years: 50.00    Types: Cigarettes  . Smokeless tobacco: Never Used  . Alcohol use No     Lives - at home by himself  Mobility - walk by self w cane   Family History :     Family History  Problem Relation Age of Onset  . Cancer Mother   . Cancer Father   . Heart attack Father   . Diabetes Father   .  Hypertension Father   . Cancer Sister   . Cancer Brother   . Diabetes Brother   . Hypertension Brother   . Hypertension Daughter       Home Medications:   Prior to Admission medications   Medication Sig Start Date End Date Taking? Authorizing Provider  Esomeprazole Magnesium (NEXIUM PO) Take 22.3 mg by mouth daily.    Yes [provider]  ranitidine (ZANTAC) 150 MG tablet Take 150 mg by mouth daily as needed for heartburn.   Yes [provider]  amLODipine (NORVASC) 10 MG tablet Take 10 mg by mouth daily.    [provider]  predniSONE (DELTASONE) 10 MG tablet Take 2 tablets (20 mg total) by mouth daily. Patient not taking: Reported on 10/18/2016 02/24/11   Roger Perches, PA-C  ULORIC 80 MG TABS Take 1 tablet by mouth daily. 06/08/12   [provider]     Allergies:    No Known Allergies   Physical Exam:   Vitals  Blood pressure (!) 162/87, pulse 78, temperature 98.5 F (36.9 C), temperature source Oral, resp. rate (!) 27, height 5\' 7"  (1.702 m), weight 99.8 kg (220 lb), SpO2 100 %.   1. General lying in bed in NAD,    2. Normal affect and insight, Not Suicidal or Homicidal, Awake Alert, Oriented X 3.  3. No F.N deficits, ALL C.Nerves Intact, Strength 5/5 all 4 extremities, Sensation intact all 4 extremities,  Plantars down going.  4. Ears and Eyes appear Normal, Conjunctivae clear, PERRLA. Moist Oral Mucosa.  5. Supple Neck, No JVD, No cervical lymphadenopathy appriciated, No Carotid Bruits.  6. Symmetrical Chest wall movement, Good air movement bilaterally, CTAB.  7. RRR, No Gallops, Rubs or Murmurs, No Parasternal Heave.  8. Positive Bowel Sounds, Abdomen Soft, No tenderness, No organomegaly appriciated,No rebound -guarding or rigidity.  9.  No Cyanosis, Normal Skin Turgor, No Skin Rash or Bruise.  10. Good muscle tone,  joints appear normal , no effusions, Normal ROM.  11. No Palpable Lymph Nodes in Neck or Axillae     Data Review:    CBC  Recent Labs Lab 10/18/16 2104  WBC 7.5  HGB 6.2*  HCT 19.4*  PLT 230  MCV 91.5  MCH 29.2  MCHC 32.0  RDW 17.7*   ------------------------------------------------------------------------------------------------------------------  Chemistries   Recent Labs Lab 10/18/16 2104  NA 139  K 5.6*  CL 114*  CO2 16*  GLUCOSE 122*  BUN 49*  CREATININE 5.74*  CALCIUM 8.5*   ------------------------------------------------------------------------------------------------------------------ estimated creatinine clearance is 12.9 mL/min (A) (by C-G formula based on SCr of 5.74 mg/dL (H)). ------------------------------------------------------------------------------------------------------------------ No results for input(s): TSH, T4TOTAL, T3FREE, THYROIDAB in the last 72 hours.  Invalid input(s): FREET3  Coagulation profile No results for input(s): INR, PROTIME in the last 168 hours. ------------------------------------------------------------------------------------------------------------------- No results for input(s): DDIMER in the last 72 hours. -------------------------------------------------------------------------------------------------------------------  Cardiac Enzymes No results for input(s): CKMB, TROPONINI, MYOGLOBIN in  the last 168 hours.  Invalid input(s): CK ------------------------------------------------------------------------------------------------------------------ No results found for: BNP   ---------------------------------------------------------------------------------------------------------------  Urinalysis    Component Value Date/Time   LABSPEC 1.020 12/26/2009 1533   PHURINE 5.0 12/26/2009 1533   HGBUR Large 12/26/2009 1533   BILIRUBINUR Negative 12/26/2009 1533   KETONESUR Negative 12/26/2009 1533   PROTEINUR Negative 12/26/2009 1533   NITRITE Negative 12/26/2009 1533   LEUKOCYTESUR Negative 12/26/2009 1533    ----------------------------------------------------------------------------------------------------------------   Imaging Results:    Dg Chest 2 View  Result Date: 10/18/2016 CLINICAL DATA:  Gastroesophageal reflux. Belching after eating. Smoker. Prostate cancer. EXAM: CHEST  2 VIEW COMPARISON:  Chest CT dated 05/05/2009. Chest radiographs dated 07/12/2004. FINDINGS: Normal sized heart. Stable minimal linear scarring at the left lung base. Otherwise, clear lungs. Aortic arch calcifications. Thoracic spine degenerative changes. IMPRESSION: No acute abnormality. Electronically Signed   By: Claudie Revering M.D.   On: 10/18/2016 21:03      Assessment & Plan:    Principal Problem:   Elevated troponin Active Problems:   Chest pain   ARF (acute renal failure) (HCC)   Hyperkalemia   Anemia     Trop elevation NSTEMI  Tele Trop I q6h x3 Check cardiac echo Aspirin,  Lipitor 80mg  po qhs Carvedilol 3.125mg  po bid  Chest pain per pt reflux protonix   Blood in stool (black stool) FOBT Protonix 80mg  iv x1 then 8mg /hr NPO after [facility] GI consult  Anemia Check iron, tibc, ferritin, b12, folate, spep, upep Tsh Transfuse 2 units prbc  ARF on CRF Nephrology consult Ns iv Urine sodium, urine creatinine urine eosinophils CT scan abd/ pelvis  Hyperkalemia Calcium  gluconate Sodium bicarb kayxalate  ?  Afib?   DVT Prophylaxis SCDs  AM Labs Ordered, also please review Full Orders  Family Communication: Admission, patients condition and plan of care including tests being ordered have been discussed with the patient  who indicate understanding and agree with the plan and Code Status.  Code Status FULL CODE  Likely DC to  home  Condition GUARDED    Consults called:cardiology by ED,  Nephrology , GI  Admission status: inpatient  Time spent in minutes : 45 minutes   Jani Gravel M.D on 10/18/2016 at 11:37 PM  Between 7am to 7pm - Pager - [phone] . After 7pm go to [email or web address] - password [facility]  [facility] - Office  [phone]

## 2016-10-18 NOTE — ED Provider Notes (Signed)
[facility] DEPT Provider Note   CSN: [id number] Arrival date & time: 10/18/16  2022     History   Chief Complaint Chief Complaint  Patient presents with  . Abdominal Pain  . Chest Pain    HPI HRIDHAAN YOHN is a 76 y.o. male.  The history is provided by the patient.   76 year old male with a history of A. fib not currently anticoagulated, CKD stage IV, GERD who presents to the emergency department for several days of intermittent chest pain which she believes is exacerbation of his acid reflux. Patient reports that he felt pain. Today approximately at 10:00 this morning that was more intense than his typical acid reflux. Pain was substernal, and deep achiness, nonexertional, nonradiating. Patient did report associated shortness of breath, nausea, nonbloody nonbilious vomiting. Patient reports that his pain was exacerbated with lying flat on the bed in improved with sitting up. No other alleviating or aggravating factors.  Additionally patient reports approximately 3 months of hematochezia. Told that he might had hemorrhoids by his nephrologist. Denied any prior colonoscopy. Denied any melena. Patient is endorsing mild lower abdominal pain that is intermittent in nature and associated with bowel movements.  Past Medical History:  Diagnosis Date  . Anemia   . Atrial fibrillation ([place])   . GERD (gastroesophageal reflux disease)   . Gout   . Myocardial infarction ([place])   . Peripheral vascular disease ([place])   . Prostate cancer ([place])   . Renal insufficiency     Patient Active Problem List   Diagnosis Date Noted  . Atherosclerotic PVD with intermittent claudication ([place]) 12/29/2013  . Peripheral vascular disease, unspecified ([place]) 12/09/2012    History reviewed. No pertinent surgical history.     Home Medications    Prior to Admission medications   Medication Sig Start Date End Date Taking? Authorizing Provider  amLODipine (NORVASC) 10 MG tablet Take 10 mg by mouth  daily.    [provider]  calcitRIOL (ROCALTROL) 0.25 MCG capsule Take 0.25 mcg by mouth daily.    [provider]  Esomeprazole Magnesium (NEXIUM PO) Take 22.3 mg by mouth.    [provider]  predniSONE (DELTASONE) 10 MG tablet Take 2 tablets (20 mg total) by mouth daily. 02/24/11   Carylon Perches, PA-C  tamsulosin (FLOMAX) 0.4 MG CAPS Take 0.4 mg by mouth daily.    [provider]  telmisartan (MICARDIS) 40 MG tablet Take 40 mg by mouth daily.      [provider]  ULORIC 80 MG TABS Take 1 tablet by mouth daily. 06/08/12   [provider]    Family History Family History  Problem Relation Age of Onset  . Cancer Mother   . Cancer Father   . Heart attack Father   . Diabetes Father   . Hypertension Father   . Cancer Sister   . Cancer Brother   . Diabetes Brother   . Hypertension Brother   . Hypertension Daughter     Social History Social History  Substance Use Topics  . Smoking status: Current Every Day Smoker    Packs/day: 0.50    Years: 50.00    Types: Cigarettes  . Smokeless tobacco: Never Used  . Alcohol use No     Allergies   Patient has no known allergies.   Review of Systems Review of Systems  All other systems are reviewed and are negative for acute change except as noted in the HPI    Physical Exam Updated Vital  Signs BP (!) 166/62 (BP Location: Left Arm)   Pulse 96   Temp 98.5 F (36.9 C) (Oral)   Resp 15   Ht 5\' 7"  (1.702 m)   Wt 99.8 kg (220 lb)   SpO2 100%   BMI 34.46 kg/m   Physical Exam  Constitutional: He is oriented to person, place, and time. He appears well-developed and well-nourished. No distress.  HENT:  Head: Normocephalic and atraumatic.  Nose: Nose normal.  Mouth/Throat: Abnormal dentition.  Eyes: Pupils are equal, round, and reactive to light. Conjunctivae and EOM are normal. Right eye exhibits no discharge. Left eye exhibits no discharge. No scleral icterus.  Neck:  Normal range of motion. Neck supple.  Cardiovascular: Normal rate and regular rhythm.  Exam reveals no gallop and no friction rub.   No murmur heard. Pulmonary/Chest: Effort normal and breath sounds normal. No stridor. No respiratory distress. He has no rales.  Abdominal: Soft. He exhibits no distension. There is tenderness (mild) in the right lower quadrant, suprapubic area and left lower quadrant. There is no rigidity, no rebound and no guarding.  Genitourinary:  Genitourinary Comments: Grossly positive dry blood noted on perineum. No active bleeding.  Musculoskeletal: He exhibits no edema or tenderness.  Neurological: He is alert and oriented to person, place, and time.  Skin: Skin is warm and dry. No rash noted. He is not diaphoretic. No erythema.  Psychiatric: He has a normal mood and affect.  Vitals reviewed.    ED Treatments / Results  Labs (all labs ordered are listed, but only abnormal results are displayed) Labs Reviewed  BASIC METABOLIC PANEL - Abnormal; Notable for the following:       Result Value   Potassium 5.6 (*)    Chloride 114 (*)    CO2 16 (*)    Glucose, Bld 122 (*)    BUN 49 (*)    Creatinine, Ser 5.74 (*)    Calcium 8.5 (*)    GFR calc non Af Amer 9 (*)    GFR calc Af Amer 10 (*)    All other components within normal limits  CBC - Abnormal; Notable for the following:    RBC 2.12 (*)    Hemoglobin 6.2 (*)    HCT 19.4 (*)    RDW 17.7 (*)    All other components within normal limits  POCT I-STAT TROPONIN I - Abnormal; Notable for the following:    Troponin i, poc 2.31 (*)    All other components within normal limits  I-STAT TROPONIN, ED  PREPARE RBC (CROSSMATCH)  TYPE AND SCREEN    EKG  EKG Interpretation  Date/Time:  Friday October 18 2016 20:41:41 EDT Ventricular Rate:  77 PR Interval:    QRS Duration: 109 QT Interval:  379 QTC Calculation: 429 R Axis:   47 Text Interpretation:  Sinus rhythm Prolonged PR interval Repol abnrm, severe global  ischemia (LM/MVD) NO STEMI Confirmed by Addison Lank [phone]) on 10/18/2016 10:20:03 PM       Radiology Dg Chest 2 View  Result Date: 10/18/2016 CLINICAL DATA:  Gastroesophageal reflux. Belching after eating. Smoker. Prostate cancer. EXAM: CHEST  2 VIEW COMPARISON:  Chest CT dated 05/05/2009. Chest radiographs dated 07/12/2004. FINDINGS: Normal sized heart. Stable minimal linear scarring at the left lung base. Otherwise, clear lungs. Aortic arch calcifications. Thoracic spine degenerative changes. IMPRESSION: No acute abnormality. Electronically Signed   By: Claudie Revering M.D.   On: 10/18/2016 21:03    Procedures Procedures (including critical care time)  CRITICAL CARE Performed by: Grayce Sessions Cardama Total critical care time: 35 minutes Critical care time was exclusive of separately billable procedures and treating other patients. Critical care was necessary to treat or prevent imminent or life-threatening deterioration. Critical care was time spent personally by me on the following activities: development of treatment plan with patient and/or surrogate as well as nursing, discussions with consultants, evaluation of patient's response to treatment, examination of patient, obtaining history from patient or surrogate, ordering and performing treatments and interventions, ordering and review of laboratory studies, ordering and review of radiographic studies, pulse oximetry and re-evaluation of patient's condition.   Medications Ordered in ED Medications  0.9 %  sodium chloride infusion (not administered)     Initial Impression / Assessment and Plan / ED Course  I have reviewed the triage vital signs and the nursing notes.  Pertinent labs & imaging results that were available during my care of the patient were reviewed by me and considered in my medical decision making (see chart for details).     Presentation consistent with a lower GI bleed resulting in severe anemia with a hemoglobin  of 6.2. This seems to be an indolent process. There is no active bleeding at this time requiring emergent colonoscopy. He is hemodynamically stable without tachycardia or hypotension. Patient will require packed red blood cell transfusion.   Chest pain appears to be atypical in nature. However EKG revealed diffuse ST depression with T-wave inversions, likely secondary to demand ischemia from severe anemia. Initial troponin was elevated at 2.3. Given the GI bleed unable to provide anticoagulation. Do not feel that this is NSTEMI; more consistent with demand ischemia. Case discussed with Dr. Harrington Challenger from cardiology who also agreed.  Discussed case with Dr. Maudie Mercury from internal medicine who admitted the patient for further workup and management. He will consult GI.  Final Clinical Impressions(s) / ED Diagnoses   Final diagnoses:  Lower GI bleed  Blood loss anemia  Other chest pain  Elevated troponin  Demand ischemia (HCC)      Cardama, Grayce Sessions, MD 10/18/16 2344

## 2016-10-18 NOTE — ED Notes (Signed)
Bed: WA15 Expected date:  Expected time:  Means of arrival:  Comments: ResB 

## 2016-10-18 NOTE — ED Triage Notes (Signed)
Pt has hx of acid reflux and is c/o belching after eating and states it is taking his medication longer to work and sometimes it does not work at all  Pt states he has not been sleeping because of it and sometimes he has vomiting  Pt has stage 4 kidney disease  Family states for the past 3-4 days he has been having periods where he will get diaphoretic and his color will change  Pt states he has been having cramping in his hands lately and has had blood in his stools for about the past month

## 2016-10-19 DIAGNOSIS — R079 Chest pain, unspecified: Secondary | ICD-10-CM

## 2016-10-19 DIAGNOSIS — R748 Abnormal levels of other serum enzymes: Secondary | ICD-10-CM

## 2016-10-19 DIAGNOSIS — K219 Gastro-esophageal reflux disease without esophagitis: Secondary | ICD-10-CM

## 2016-10-19 DIAGNOSIS — I248 Other forms of acute ischemic heart disease: Secondary | ICD-10-CM

## 2016-10-19 DIAGNOSIS — K625 Hemorrhage of anus and rectum: Secondary | ICD-10-CM

## 2016-10-19 DIAGNOSIS — K922 Gastrointestinal hemorrhage, unspecified: Secondary | ICD-10-CM

## 2016-10-19 DIAGNOSIS — N179 Acute kidney failure, unspecified: Principal | ICD-10-CM

## 2016-10-19 DIAGNOSIS — D649 Anemia, unspecified: Secondary | ICD-10-CM

## 2016-10-19 DIAGNOSIS — Z720 Tobacco use: Secondary | ICD-10-CM

## 2016-10-19 DIAGNOSIS — K921 Melena: Secondary | ICD-10-CM

## 2016-10-19 LAB — COMPREHENSIVE METABOLIC PANEL
ALBUMIN: 3.7 g/dL (ref 3.5–5.0)
ALT: 11 U/L — ABNORMAL LOW (ref 17–63)
ANION GAP: 6 (ref 5–15)
AST: 21 U/L (ref 15–41)
Alkaline Phosphatase: 122 U/L (ref 38–126)
BILIRUBIN TOTAL: 0.5 mg/dL (ref 0.3–1.2)
BUN: 45 mg/dL — ABNORMAL HIGH (ref 6–20)
CHLORIDE: 115 mmol/L — AB (ref 101–111)
CO2: 18 mmol/L — ABNORMAL LOW (ref 22–32)
Calcium: 8.4 mg/dL — ABNORMAL LOW (ref 8.9–10.3)
Creatinine, Ser: 5.38 mg/dL — ABNORMAL HIGH (ref 0.61–1.24)
GFR calc Af Amer: 11 mL/min — ABNORMAL LOW (ref >60)
GFR calc non Af Amer: 9 mL/min — ABNORMAL LOW (ref >60)
GLUCOSE: 91 mg/dL (ref 65–99)
POTASSIUM: 6 mmol/L — AB (ref 3.5–5.1)
Sodium: 139 mmol/L (ref 135–145)
TOTAL PROTEIN: 6.9 g/dL (ref 6.5–8.1)

## 2016-10-19 LAB — TROPONIN I
TROPONIN I: 3.4 ng/mL — AB (ref <0.03)
Troponin I: 2.68 ng/mL
Troponin I: 2.98 ng/mL

## 2016-10-19 LAB — MAGNESIUM: Magnesium: 2 mg/dL (ref 1.7–2.4)

## 2016-10-19 LAB — IRON AND TIBC
IRON: 73 ug/dL (ref 45–182)
SATURATION RATIOS: 25 % (ref 17.9–39.5)
TIBC: 291 ug/dL (ref 250–450)
UIBC: 218 ug/dL

## 2016-10-19 LAB — FERRITIN: Ferritin: 66 ng/mL (ref 24–336)

## 2016-10-19 LAB — CBC
HEMATOCRIT: 25.2 % — AB (ref 39.0–52.0)
HEMOGLOBIN: 8.2 g/dL — AB (ref 13.0–17.0)
MCH: 29.1 pg (ref 26.0–34.0)
MCHC: 32.5 g/dL (ref 30.0–36.0)
MCV: 89.4 fL (ref 78.0–100.0)
Platelets: 184 10*3/uL (ref 150–400)
RBC: 2.82 MIL/uL — ABNORMAL LOW (ref 4.22–5.81)
RDW: 17.1 % — ABNORMAL HIGH (ref 11.5–15.5)
WBC: 8.3 10*3/uL (ref 4.0–10.5)

## 2016-10-19 LAB — TSH: TSH: 2.539 u[IU]/mL (ref 0.350–4.500)

## 2016-10-19 LAB — PHOSPHORUS: Phosphorus: 4.2 mg/dL (ref 2.5–4.6)

## 2016-10-19 LAB — VITAMIN B12: Vitamin B-12: 243 pg/mL (ref 180–914)

## 2016-10-19 LAB — ABO/RH: ABO/RH(D): O POS

## 2016-10-19 LAB — MRSA PCR SCREENING: MRSA BY PCR: NEGATIVE

## 2016-10-19 MED ORDER — ACETAMINOPHEN 325 MG PO TABS
650.0000 mg | ORAL_TABLET | Freq: Four times a day (QID) | ORAL | Status: DC | PRN
  Filled 2016-10-19: qty 2

## 2016-10-19 MED ORDER — HYDRALAZINE HCL 20 MG/ML IJ SOLN
5.0000 mg | Freq: Four times a day (QID) | INTRAMUSCULAR | Status: DC | PRN
  Administered 2016-10-23: 5 mg via INTRAVENOUS

## 2016-10-19 MED ORDER — HYDRALAZINE HCL 50 MG PO TABS
25.0000 mg | ORAL_TABLET | Freq: Three times a day (TID) | ORAL | Status: DC
  Administered 2016-10-19 – 2016-10-21 (×6): 25 mg via ORAL
  Filled 2016-10-19 (×5): qty 1

## 2016-10-19 MED ORDER — INSULIN REGULAR BOLUS VIA INFUSION
8.0000 [IU] | Freq: Once | INTRAVENOUS | Status: DC
  Filled 2016-10-19: qty 8

## 2016-10-19 MED ORDER — CARVEDILOL 3.125 MG PO TABS
3.1250 mg | ORAL_TABLET | Freq: Two times a day (BID) | ORAL | Status: DC
Start: 2016-10-19 — End: 2016-10-26
  Administered 2016-10-19 – 2016-10-25 (×12): 3.125 mg via ORAL
  Filled 2016-10-19 (×13): qty 1

## 2016-10-19 MED ORDER — SODIUM CHLORIDE 0.9 % IV SOLN
8.0000 mg/h | INTRAVENOUS | Status: DC
  Administered 2016-10-19 – 2016-10-21 (×3): 8 mg/h via INTRAVENOUS
  Filled 2016-10-19 (×10): qty 80

## 2016-10-19 MED ORDER — SODIUM BICARBONATE 8.4 % IV SOLN
50.0000 meq | Freq: Once | INTRAVENOUS | Status: AC
  Administered 2016-10-19: 50 meq via INTRAVENOUS
  Filled 2016-10-19: qty 50

## 2016-10-19 MED ORDER — CARVEDILOL 6.25 MG PO TABS: 6.2500 mg | ORAL_TABLET | Freq: Two times a day (BID) | ORAL | Status: DC

## 2016-10-19 MED ORDER — DEXTROSE 50 % IV SOLN
1.0000 | Freq: Once | INTRAVENOUS | Status: AC
  Administered 2016-10-19: 50 mL via INTRAVENOUS
  Filled 2016-10-19: qty 50

## 2016-10-19 MED ORDER — SODIUM BICARBONATE 8.4 % IV SOLN
INTRAVENOUS | Status: AC
  Filled 2016-10-19: qty 50

## 2016-10-19 MED ORDER — SODIUM CHLORIDE 0.9 % IV SOLN
INTRAVENOUS | Status: AC
  Administered 2016-10-19: 05:00:00 via INTRAVENOUS

## 2016-10-19 MED ORDER — SODIUM CHLORIDE 0.9 % IV SOLN
1.0000 g | Freq: Once | INTRAVENOUS | Status: AC
  Administered 2016-10-19: 1 g via INTRAVENOUS
  Filled 2016-10-19: qty 10

## 2016-10-19 MED ORDER — ASPIRIN EC 81 MG PO TBEC
81.0000 mg | DELAYED_RELEASE_TABLET | Freq: Every day | ORAL | Status: DC
  Administered 2016-10-19: 81 mg via ORAL
  Filled 2016-10-19: qty 1

## 2016-10-19 MED ORDER — SODIUM POLYSTYRENE SULFONATE 15 GM/60ML PO SUSP
15.0000 g | Freq: Once | ORAL | Status: AC
  Administered 2016-10-19: 15 g via ORAL
  Filled 2016-10-19: qty 60

## 2016-10-19 MED ORDER — INSULIN ASPART 100 UNIT/ML ~~LOC~~ SOLN
8.0000 [IU] | Freq: Once | SUBCUTANEOUS | Status: AC
  Administered 2016-10-19: 8 [IU] via INTRAVENOUS

## 2016-10-19 MED ORDER — FEBUXOSTAT 40 MG PO TABS
80.0000 mg | ORAL_TABLET | Freq: Every day | ORAL | Status: DC
  Administered 2016-10-19 – 2016-10-24 (×6): 80 mg via ORAL
  Filled 2016-10-19 (×7): qty 2

## 2016-10-19 MED ORDER — PANTOPRAZOLE SODIUM 40 MG IV SOLR
80.0000 mg | Freq: Once | INTRAVENOUS | Status: AC
  Administered 2016-10-19: 80 mg via INTRAVENOUS
  Filled 2016-10-19 (×2): qty 80

## 2016-10-19 MED ORDER — AMLODIPINE BESYLATE 10 MG PO TABS
10.0000 mg | ORAL_TABLET | Freq: Every day | ORAL | Status: DC
  Administered 2016-10-19 – 2016-10-26 (×6): 10 mg via ORAL
  Filled 2016-10-19 (×7): qty 1

## 2016-10-19 MED ORDER — ACETAMINOPHEN 650 MG RE SUPP: 650.0000 mg | Freq: Four times a day (QID) | RECTAL | Status: DC | PRN

## 2016-10-19 NOTE — ED Notes (Signed)
Verified Emtala

## 2016-10-19 NOTE — Consult Note (Signed)
Renal Service Consult Note [place] 10/19/2016 Sol Blazing Requesting Physician:  Dr Burnis Medin  Reason for Consult:  Renal failure HPI: The patient is a 76 y.o. year-old with history of CKD stage 4, PVD, anemia , HTN and gout who presented to the ED 10/18/16 with c/o acid reflux and belching after eating, not sleeping because of it, some vomiting.  Sweating episodes and skin changes color.  Cramping in hand for about 1 month.  In ED Hb was low at 6.2, and he had chest pain, atypical in its nature.  Trop ^'d and cards said it was probably demand ischemia.  Trop up to 3.40 today, alb 3.0, AST./ ALT are ok.  He is having bloody stools.    Pt was f/b Dr Hassell Done w/ [facility] and now by Dr Joelyn Oms.  He was following up until 1 yr ago , per dtr, when he stopped seeing all his physicians. He has had sig nausea, some emesis, appetite comes and goes.  Elam Dutch.  No abd pain.    Home meds: Nexium, zantac, amlodipine, prednisone, Uloric 80/d   Creat   Date  eGFR 2012  2.7- 3.5 2013  1.95- 2.49 2014  2.42- 3.32 20- 25 10/18/16  5.74 10/19/16  5.38  10    ROS  no joint pain   no HA  no blurry vision  no rash  no diarrhea  no nausea/ vomiting  no dysuria  no difficulty voiding  no change in urine color    Past Medical History  Past Medical History:  Diagnosis Date  . Anemia   . Atrial fibrillation ([place])   . DVT (deep venous thrombosis) ([place])   . GERD (gastroesophageal reflux disease)   . Gout   . Myocardial infarction ([place])   . Peripheral vascular disease ([place])   . Prostate cancer ([place])   . Renal insufficiency    Past Surgical History History reviewed. No pertinent surgical history. Family History  Family History  Problem Relation Age of Onset  . Cancer Mother   . Cancer Father   . Heart attack Father   . Diabetes Father   . Hypertension Father   . Cancer Sister   . Cancer Brother   . Diabetes Brother   . Hypertension Brother   . Hypertension Daughter     Social History  reports that he has been smoking Cigarettes.  He has a 25.00 pack-year smoking history. He has never used smokeless tobacco. He reports that he does not drink alcohol or use drugs. Allergies No Known Allergies Home medications Prior to Admission medications   Medication Sig Start Date End Date Taking? Authorizing Provider  Esomeprazole Magnesium (NEXIUM PO) Take 22.3 mg by mouth daily.    Yes [provider]  ranitidine (ZANTAC) 150 MG tablet Take 150 mg by mouth daily as needed for heartburn.   Yes [provider]  amLODipine (NORVASC) 10 MG tablet Take 10 mg by mouth daily.    [provider]  predniSONE (DELTASONE) 10 MG tablet Take 2 tablets (20 mg total) by mouth daily. Patient not taking: Reported on 10/18/2016 02/24/11   Carylon Perches, PA-C  ULORIC 80 MG TABS Take 1 tablet by mouth daily. 06/08/12   [provider]   Liver Function Tests  Recent Labs Lab 10/19/16 1058  AST 21  ALT 11*  ALKPHOS 122  BILITOT 0.5  PROT 6.9  ALBUMIN 3.7   No results for input(s): LIPASE, AMYLASE in the last 168  hours. CBC  Recent Labs Lab 10/18/16 2104 10/19/16 1058  WBC 7.5 8.3  HGB 6.2* 8.2*  HCT 19.4* 25.2*  MCV 91.5 89.4  PLT 230 333   Basic Metabolic Panel  Recent Labs Lab 10/18/16 2104 10/19/16 0636 10/19/16 1058  NA 139  --  139  K 5.6*  --  6.0*  CL 114*  --  115*  CO2 16*  --  18*  GLUCOSE 122*  --  91  BUN 49*  --  45*  CREATININE 5.74*  --  5.38*  CALCIUM 8.5*  --  8.4*  PHOS  --  4.2  --    Iron/TIBC/Ferritin/ %Sat    Component Value Date/Time   IRON 73 10/19/2016 0636   TIBC 291 10/19/2016 0636   FERRITIN 66 10/19/2016 0636   IRONPCTSAT 25 10/19/2016 0636    Vitals:   10/19/16 0615 10/19/16 0630 10/19/16 0745 10/19/16 1104  BP: (!) 164/65 (!) 169/66 (!) 166/61 (!) 166/62  Pulse:   (!) 58 (!) 57  Resp: '17 16 20 15  ' Temp:   98.1 F (36.7 C) 97.6 F (36.4 C)  TempSrc:   Oral Oral  SpO2: 97%  100% 100% 100%  Weight:      Height:       Exam Gen older adult AAM, no distress, joking, calm No rash, cyanosis or gangrene Sclera anicteric, throat clear  No jvd or bruits Chest clear bilat RRR no MRG Abd soft ntnd no mass or ascites +bs GU normal male MS no joint effusions or deformity Ext no LE edema / no wounds or ulcers Neuro is alert, Ox 3 , nf, no asterixis  Creat 5.38  eGFR 11 Hb 8.2  plt 184  WBC 8  Creat   Date  eGFR 2012  2.7- 3.5 2013  1.95- 2.49 2014  2.42- 3.32 20- 25 10/18/16  5.74 10/19/16  5.38  10    Impression: 1  Renal failure - eGFR in 2014 was 20 ml/min.  Now 10 ml/min.  Doubt reversible cause, likely progression and CKD stage V. Will get UA and renal US.  Long discussion w/ dtr and patient about pro's and con's of dialysis.  Pt not sure if he wants dialysis but I feel he would be a good candidate if he so wishes.  He will think about it this weekend. If yes, would recommend start during this admission.  2  Anemia of CKD/ ABL 3  Rectal bleeding 4  HTN on 3 bp meds 5  AFib  6  Hx DVT 7  CAD hx MI 8  PAD hx claudication 9  Hx prostate cancer   Plan - as above  Kelly Splinter MD [facility] pager [phone]   10/19/2016, 2:35 PM

## 2016-10-19 NOTE — Progress Notes (Signed)
10/19/2016 0545   Patient admitted to 2C03. Initial assessment not completed. Patient refused to take off his clothes to complete skin assessment. Patient and daughter educated on importance of accurate skin assessment. Still unable to complete assessment. Charge RN made aware. Provider to be made aware per AMION. Will continue to monitor and update as needed.   Mick Sell RN

## 2016-10-19 NOTE — Consult Note (Signed)
Attending physician's note   I have taken a history, examined the patient and reviewed the chart. I agree with the Advanced Practitioner's note, impression and recommendations. 76 year old male with chronic kidney disease, CAD admitted with chest pain and worsening anemia. Patient also has worsening creatinine, acute on chronic kidney injury. Troponin is elevated to 3.4, hasn't peaked yet. Hemoglobin improved to 8.2 status post transfusion 2 units packed RBC. Patient gives history of intermittent dark stool and blood per rectum for past few weeks.  Patient will need EGD and colonoscopy for evaluation once he is optimized from cardiac and renal standpoint Continue supportive care, monitor hemoglobin and transfuse as needed We will continue to follow  K Denzil Magnuson, MD [phone] Mon-Fri 8a-5p [phone] after 5p, weekends, holidays  Referring Provider: [facility]  Primary Care Physician:  [name], MD Primary Gastroenterologist:  unassigned  Reason for Consultation:  Rectal bleeding  ASSESSMENT AND PLAN:    61. 76 yo male admitted with CAD admitted with GERD sx / chest pain / painless rectal bleeding. Found to be severely anemic with hgb of 6.2. Trop up at 2.68. Admitting team spoke with Cardiology who feels this is demand ischemia. Troponin has risen to 3.4.  -. Rectal bleeding may be hemorrhoidal but polyps / neoplasm needs to be excluded.  He will need colonoscopy this admission. Will reassess him tomorrow for colonoscopy on Monday (if stable from cardiac standpoint).   2. Acute on chronic likely combination of CKD and rectal bleeding. Hgb up  2 grams to 8.2 after 2 units of blood. Patient described dark stool to admitting MD and so placed n PPI ggt. He describes more red blood to me and there is scant fresh blood on exam. -Since this is unclear I will leave on PPI gtt for now.   3. AKI on CKD. Nephrology to evaluate.  4. Chronic GERD. On PPI and H2 blockers. Complains of  persistent GERD sx. - If current sx turn out not to be cardiac then he may need EGD at time of Colonoscopy for evaluation of refractory GERD sx.  HPI: OM LIZOTTE is a 76 y.o. male with CKD, HTN, PVD, CAD, hx of prostate cancer who presented to ED 8/3 with belching / reflux, chest discomfort, and diaphoresis. Hgb 6.2. Ferritin 66. B12 243. EKG >> ST depression. Trop 2.68. Cardiology was called and felt EKG changes related to demand ischemia. Hgb 8.2 post 2 units of blood.. He has been having painless rectal bleeding with BMs for weeks if not months. Daughter thinks he may of had a colonoscopy in 2010 but not sure and patient doesn't know. Patient says he has lost some weight, pants are too big.  Family just found out about the bleeding last week.  Non-contrast CTscan negative for acute findings. Focal lobulated prominence of superior pole of spleen.   Patient has CKD. Per daughter he was getting injections for anemia (? Procrit) about every 1-2 weeks but stopped going for them 1.5 year ago. He has no abdominal pain, just acid regurgitation / heartburn despite PPI / No odynophagia.     Past Medical History:  Diagnosis Date  . Anemia   . Atrial fibrillation ([place])   . DVT (deep venous thrombosis) ([place])   . GERD (gastroesophageal reflux disease)   . Gout   . Myocardial infarction ([place])   . Peripheral vascular disease ([place])   . Prostate cancer ([place])   . Renal insufficiency     History reviewed. No pertinent surgical  history.  Prior to Admission medications   Medication Sig Start Date End Date Taking? Authorizing Provider  Esomeprazole Magnesium (NEXIUM PO) Take 22.3 mg by mouth daily.    Yes [provider]  ranitidine (ZANTAC) 150 MG tablet Take 150 mg by mouth daily as needed for heartburn.   Yes [provider]  amLODipine (NORVASC) 10 MG tablet Take 10 mg by mouth daily.    [provider]  predniSONE (DELTASONE) 10 MG tablet Take 2 tablets (20 mg total) by  mouth daily. Patient not taking: Reported on 10/18/2016 02/24/11   Carylon Perches, PA-C  ULORIC 80 MG TABS Take 1 tablet by mouth daily. 06/08/12   [provider]    Current Facility-Administered Medications  Medication Dose Route Frequency Provider Last Rate Last Dose  . 0.9 %  sodium chloride infusion   Intravenous Continuous Jani Gravel, MD 75 mL/hr at 10/19/16 0448    . acetaminophen (TYLENOL) tablet 650 mg  650 mg Oral Q6H PRN Jani Gravel, MD       Or  . acetaminophen (TYLENOL) suppository 650 mg  650 mg Rectal Q6H PRN Jani Gravel, MD      . amLODipine (NORVASC) tablet 10 mg  10 mg Oral Daily Jani Gravel, MD   10 mg at 10/19/16 1104  . aspirin EC tablet 81 mg  81 mg Oral Daily Jani Gravel, MD   81 mg at 10/19/16 1059  . atorvastatin (LIPITOR) tablet 80 mg  80 mg Oral q1800 Jani Gravel, MD      . carvedilol (COREG) tablet 3.125 mg  3.125 mg Oral BID WC Jani Gravel, MD   3.125 mg at 10/19/16 1059  . febuxostat (ULORIC) tablet 80 mg  80 mg Oral Daily Jani Gravel, MD   80 mg at 10/19/16 1059  . hydrALAZINE (APRESOLINE) injection 5 mg  5 mg Intravenous Q6H PRN Jani Gravel, MD      . ondansetron [facility]) injection 4 mg  4 mg Intravenous Once Cardama, Grayce Sessions, MD      . pantoprazole (PROTONIX) 80 mg in sodium chloride 0.9 % 250 mL (0.32 mg/mL) infusion  8 mg/hr Intravenous Continuous Jani Gravel, MD 25 mL/hr at 10/19/16 0556 8 mg/hr at 10/19/16 0556    Allergies as of 10/18/2016  . (No Known Allergies)    Family History  Problem Relation Age of Onset  . Cancer Mother   . Cancer Father   . Heart attack Father   . Diabetes Father   . Hypertension Father   . Cancer Sister   . Cancer Brother   . Diabetes Brother   . Hypertension Brother   . Hypertension Daughter     Social History   Social History  . Marital status: Widowed    Spouse name: N/A  . Number of children: N/A  . Years of education: N/A   Occupational History  . Not on file.   Social History Main Topics  .  Smoking status: Current Every Day Smoker    Packs/day: 0.50    Years: 50.00    Types: Cigarettes  . Smokeless tobacco: Never Used  . Alcohol use No  . Drug use: No  . Sexual activity: Not on file   Other Topics Concern  . Not on file   Social History Narrative  . No narrative on file    Review of Systems: All systems reviewed and negative except where noted in HPI.  Physical Exam: Vital signs in last 24 hours: Temp:  [97.9  F (36.6 C)-98.7 F (37.1 C)] 98.1 F (36.7 C) (08/04 0745) Pulse Rate:  [58-112] 58 (08/04 0745) Resp:  [15-27] 20 (08/04 0745) BP: (136-177)/(44-94) 166/62 (08/04 1104) SpO2:  [76 %-100 %] 100 % (08/04 0745) Weight:  [205 lb 11 oz (93.3 kg)-220 lb (99.8 kg)] 205 lb 11 oz (93.3 kg) (08/04 0500) Last BM Date: 10/19/16 General:   Alert, well-developed, black male in NAD Psych: cooperative. Normal mood and affect. Eyes:  Pupils equal, sclera clear, no icterus.   Conjunctiva pink. Ears:  Normal auditory acuity. Nose:  No deformity, discharge,  or lesions. Neck:  Supple; no masses Lungs:  Clear throughout to auscultation.   No wheezes, crackles, or rhonchi.  Heart:  Regular rate and rhythm; no murmurs, no edema Abdomen:  Soft, non-distended, nontender, BS active, no palp mass    Rectal:  Scant debris with blood.  Msk:  Symmetrical without gross deformities. . Pulses:  Normal pulses noted. Neurologic:  Alert and  oriented x4;  grossly normal neurologically. Skin:  Intact without significant lesions or rashes..   Intake/Output from previous day: 08/03 0701 - 08/04 0700 In: 1059.5 [I.V.:40; Blood:909.5; IV Piggyback:110] Out: -  Intake/Output this shift: No intake/output data recorded.  Lab Results:  Recent Labs  10/18/16 2104 10/19/16 1058  WBC 7.5 8.3  HGB 6.2* 8.2*  HCT 19.4* 25.2*  PLT 230 184   BMET  Recent Labs  10/18/16 2104  NA 139  K 5.6*  CL 114*  CO2 16*  GLUCOSE 122*  BUN 49*  CREATININE 5.74*  CALCIUM 8.5*     Studies/Results: Ct Abdomen Pelvis Wo Contrast  Result Date: 10/19/2016 CLINICAL DATA:  76 year old male with abdominal pain. EXAM: CT ABDOMEN AND PELVIS WITHOUT CONTRAST TECHNIQUE: Multidetector CT imaging of the abdomen and pelvis was performed following the standard protocol without IV contrast. COMPARISON:  Abdominal CT dated 04/26/2009 FINDINGS: Evaluation of this exam is limited in the absence of intravenous contrast. Lower chest: The visualized lung bases are clear. There is coronary vascular calcification. There is hypoattenuation of the cardiac blood pool suggestive of a degree of anemia. Clinical correlation is recommended. No intra-abdominal free air or free fluid. Hepatobiliary: No focal liver abnormality is seen. No gallstones, gallbladder wall thickening, or biliary dilatation. Pancreas: Unremarkable. No pancreatic ductal dilatation or surrounding inflammatory changes. Spleen: Focal prominence of the superior pole of the spleen (series 2, image 16 and coronal series 5, image 124) is not well characterized and may be related to splenic morphology or represent a splenic lesion. This is new compared to the prior CT of 2011. CT with IV contrast or MRI may provide better characterization if clinically indicated. Adrenals/Urinary Tract: The adrenal glands are unremarkable. There is moderate bilateral renal atrophy, right greater than left. Subcentimeter right renal exophytic hypodense lesions are not characterized but likely represent cysts. There is no hydronephrosis or nephrolithiasis on either side. The visualized ureters appear unremarkable. The urinary bladder is only partially distended and is grossly unremarkable. Stomach/Bowel: There are small scattered colonic as well as terminal ileum diverticula without active inflammatory changes. There is no evidence of bowel obstruction or active inflammation. Normal appendix. Vascular/Lymphatic: There is moderate aortoiliac atherosclerotic disease.  Evaluation of the vasculature is limited in the absence of intravenous contrast. No portal venous gas identified. There is no adenopathy. Reproductive: The prostate gland is enlarged measuring approximately 5.8 cm in transverse diameter (previously 7.2 cm). Multiple metallic prostate biopsy clips noted. Other: None Musculoskeletal: There is multilevel degenerative changes of  the spine. No acute osseous pathology. IMPRESSION: 1. No acute intra-abdominal or pelvic pathology. No bowel obstruction or active inflammation. Normal appendix. 2. Small scattered colonic and terminal ileum diverticulum without active inflammation. 3. Focal lobulated prominence of the superior pole of the spleen, incompletely characterized. CT with IV contrast or MRI may provide better characterization if clinically indicated. 4. Bilateral renal atrophy.  No hydronephrosis or nephrolithiasis. 5.  Aortic Atherosclerosis (ICD10-I70.0). Electronically Signed   By: Anner Crete M.D.   On: 10/19/2016 00:12   Dg Chest 2 View  Result Date: 10/18/2016 CLINICAL DATA:  Gastroesophageal reflux. Belching after eating. Smoker. Prostate cancer. EXAM: CHEST  2 VIEW COMPARISON:  Chest CT dated 05/05/2009. Chest radiographs dated 07/12/2004. FINDINGS: Normal sized heart. Stable minimal linear scarring at the left lung base. Otherwise, clear lungs. Aortic arch calcifications. Thoracic spine degenerative changes. IMPRESSION: No acute abnormality. Electronically Signed   By: Claudie Revering M.D.   On: 10/18/2016 21:03     Tye Savoy, NP-C @  10/19/2016, 11:40 AM  Pager number [phone]

## 2016-10-19 NOTE — Progress Notes (Signed)
PROGRESS NOTE    Roger [name]  TJQ:[id number] DOB: [birth year] DOA: 10/18/2016 PCP: [name], MD     Brief Narrative:   76 y.o. male, w CKD stage4/5 PVD, Pafib ,  Anemia, apparently went out to eat and then c/o nv, no bloody emesis. + diaphoresis,  + burning pain in chest for a while, Pt notes blood in stool (black stool) and epigastric discomfort for 1-2 months.     Assessment & Plan:   1-Acute GI bleed most likely upper with acute blood loss anemia: Status post transfusion of packed RBCs and hemoglobin of 8 , no more apparent GI bleed, continue PPI drip, nothing by mouth for now, Pending GI input. 2-type II non-ST elevation MI in the setting of supply demand mismatch causing the severe anemia in the setting of GI bleed chest pain-free for now, continue beta blocker , hold aspirin or any anticoagulation in the setting of GI bleed . 3-AKI/CKD : Related to dehydration and anemia, complicated with hyperkalemia, hydrate follow-up with a BMP avoid nephrotoxic agents.    DVT prophylaxis: SCD. Code Status: (Full) Family Communication: (daughter  Disposition Plan: (home    Consultants:   GI  Procedures:    Antimicrobials:NONE   Subjective:  Patient denies any more nausea or vomiting or coffee-ground emesis or blood in the stool, epigastric pain, chest pain-free, no shortness of breath  Objective: Vitals:   10/19/16 0615 10/19/16 0630 10/19/16 0745 10/19/16 1104  BP: (!) 164/65 (!) 169/66  (!) 166/62  Pulse:   (!) 58   Resp: 17 16 20    Temp:   98.1 F (36.7 C)   TempSrc:   Oral   SpO2: 97% 100% 100%   Weight:      Height:        Intake/Output Summary (Last 24 hours) at 10/19/16 1202 Last data filed at 10/19/16 0900  Gross per 24 hour  Intake           1059.5 ml  Output                0 ml  Net           1059.5 ml   Filed Weights   10/18/16 2033 10/19/16 0500  Weight: 99.8 kg (220 lb) 93.3 kg (205 lb 11 oz)    Examination:  General exam: Appears calm  and comfortable  Respiratory system: Clear to auscultation. Respiratory effort normal. Cardiovascular system: S1 & S2 heard, RRR. No JVD, murmurs, rubs, gallops or clicks. No pedal edema. Gastrointestinal system: Epigastric tenderness no rebound or rigidity . Central nervous system: Alert and oriented. No focal neurological deficits. Extremities: Symmetric 5 x 5 power. Skin: No rashes, lesions or ulcers Psychiatry: Judgement and insight appear normal. Mood & affect appropriate.     Data Reviewed: I have personally reviewed following labs and imaging studies  CBC:  Recent Labs Lab 10/18/16 2104 10/19/16 1058  WBC 7.5 8.3  HGB 6.2* 8.2*  HCT 19.4* 25.2*  MCV 91.5 89.4  PLT 230 762   Basic Metabolic Panel:  Recent Labs Lab 10/18/16 2104 10/19/16 0636  NA 139  --   K 5.6*  --   CL 114*  --   CO2 16*  --   GLUCOSE 122*  --   BUN 49*  --   CREATININE 5.74*  --   CALCIUM 8.5*  --   MG  --  2.0  PHOS  --  4.2   GFR: Estimated Creatinine Clearance: 12.5 mL/min (  A) (by C-G formula based on SCr of 5.74 mg/dL (H)). Liver Function Tests: No results for input(s): AST, ALT, ALKPHOS, BILITOT, PROT, ALBUMIN in the last 168 hours. No results for input(s): LIPASE, AMYLASE in the last 168 hours. No results for input(s): AMMONIA in the last 168 hours. Coagulation Profile: No results for input(s): INR, PROTIME in the last 168 hours. Cardiac Enzymes:  Recent Labs Lab 10/19/16 0636  TROPONINI 2.68*   BNP (last 3 results) No results for input(s): PROBNP in the last 8760 hours. HbA1C: No results for input(s): HGBA1C in the last 72 hours. CBG: No results for input(s): GLUCAP in the last 168 hours. Lipid Profile: No results for input(s): CHOL, HDL, LDLCALC, TRIG, CHOLHDL, LDLDIRECT in the last 72 hours. Thyroid Function Tests:  Recent Labs  10/19/16 0637  TSH 2.539   Anemia Panel:  Recent Labs  10/19/16 0636  VITAMINB12 243  FERRITIN 66  TIBC 291  IRON 73   Urine  analysis:    Component Value Date/Time   LABSPEC 1.020 12/26/2009 1533   PHURINE 5.0 12/26/2009 1533   HGBUR Large 12/26/2009 1533   BILIRUBINUR Negative 12/26/2009 1533   KETONESUR Negative 12/26/2009 1533   PROTEINUR Negative 12/26/2009 1533   NITRITE Negative 12/26/2009 1533   LEUKOCYTESUR Negative 12/26/2009 1533   Sepsis Labs: @LABRCNTIP (procalcitonin:4,lacticidven:4)  ) Recent Results (from the past 240 hour(s))  MRSA PCR Screening     Status: None   Collection Time: 10/19/16  5:20 AM  Result Value Ref Range Status   MRSA by PCR NEGATIVE NEGATIVE Final    Comment:        The GeneXpert MRSA Assay (FDA approved for NASAL specimens only), is one component of a comprehensive MRSA colonization surveillance program. It is not intended to diagnose MRSA infection nor to guide or monitor treatment for MRSA infections.          Radiology Studies: Ct Abdomen Pelvis Wo Contrast  Result Date: 10/19/2016 CLINICAL DATA:  76 year old male with abdominal pain. EXAM: CT ABDOMEN AND PELVIS WITHOUT CONTRAST TECHNIQUE: Multidetector CT imaging of the abdomen and pelvis was performed following the standard protocol without IV contrast. COMPARISON:  Abdominal CT dated 04/26/2009 FINDINGS: Evaluation of this exam is limited in the absence of intravenous contrast. Lower chest: The visualized lung bases are clear. There is coronary vascular calcification. There is hypoattenuation of the cardiac blood pool suggestive of a degree of anemia. Clinical correlation is recommended. No intra-abdominal free air or free fluid. Hepatobiliary: No focal liver abnormality is seen. No gallstones, gallbladder wall thickening, or biliary dilatation. Pancreas: Unremarkable. No pancreatic ductal dilatation or surrounding inflammatory changes. Spleen: Focal prominence of the superior pole of the spleen (series 2, image 16 and coronal series 5, image 124) is not well characterized and may be related to splenic  morphology or represent a splenic lesion. This is new compared to the prior CT of 2011. CT with IV contrast or MRI may provide better characterization if clinically indicated. Adrenals/Urinary Tract: The adrenal glands are unremarkable. There is moderate bilateral renal atrophy, right greater than left. Subcentimeter right renal exophytic hypodense lesions are not characterized but likely represent cysts. There is no hydronephrosis or nephrolithiasis on either side. The visualized ureters appear unremarkable. The urinary bladder is only partially distended and is grossly unremarkable. Stomach/Bowel: There are small scattered colonic as well as terminal ileum diverticula without active inflammatory changes. There is no evidence of bowel obstruction or active inflammation. Normal appendix. Vascular/Lymphatic: There is moderate aortoiliac atherosclerotic  disease. Evaluation of the vasculature is limited in the absence of intravenous contrast. No portal venous gas identified. There is no adenopathy. Reproductive: The prostate gland is enlarged measuring approximately 5.8 cm in transverse diameter (previously 7.2 cm). Multiple metallic prostate biopsy clips noted. Other: None Musculoskeletal: There is multilevel degenerative changes of the spine. No acute osseous pathology. IMPRESSION: 1. No acute intra-abdominal or pelvic pathology. No bowel obstruction or active inflammation. Normal appendix. 2. Small scattered colonic and terminal ileum diverticulum without active inflammation. 3. Focal lobulated prominence of the superior pole of the spleen, incompletely characterized. CT with IV contrast or MRI may provide better characterization if clinically indicated. 4. Bilateral renal atrophy.  No hydronephrosis or nephrolithiasis. 5.  Aortic Atherosclerosis (ICD10-I70.0). Electronically Signed   By: Anner Crete M.D.   On: 10/19/2016 00:12   Dg Chest 2 View  Result Date: 10/18/2016 CLINICAL DATA:  Gastroesophageal  reflux. Belching after eating. Smoker. Prostate cancer. EXAM: CHEST  2 VIEW COMPARISON:  Chest CT dated 05/05/2009. Chest radiographs dated 07/12/2004. FINDINGS: Normal sized heart. Stable minimal linear scarring at the left lung base. Otherwise, clear lungs. Aortic arch calcifications. Thoracic spine degenerative changes. IMPRESSION: No acute abnormality. Electronically Signed   By: Claudie Revering M.D.   On: 10/18/2016 21:03        Scheduled Meds: . amLODipine  10 mg Oral Daily  . aspirin EC  81 mg Oral Daily  . atorvastatin  80 mg Oral q1800  . carvedilol  3.125 mg Oral BID WC  . febuxostat  80 mg Oral Daily  . ondansetron (ZOFRAN) IV  4 mg Intravenous Once   Continuous Infusions: . sodium chloride 75 mL/hr at 10/19/16 0448  . pantoprozole (PROTONIX) infusion 8 mg/hr (10/19/16 0556)     LOS: 1 day    Time spent: [place], MD [facility] Pager [phone]  If 7PM-7AM, please contact night-coverage [email or web address] Password [email or web address] 10/19/2016, 12:02 PM

## 2016-10-19 NOTE — Consult Note (Addendum)
Cardiology Consultation:   Patient ID: Roger [name]; [id number]; 12-31-1940   Admit date: 10/18/2016 Date of Consult: 10/19/2016  Primary Care Provider: [name], MD Primary Cardiologist: Dr. Oval Linsey (new)   Patient Profile:   Roger [name] is a 76 y.o. male with a hx of PAD, tobacco abuse, and CKD 4/5 who is being seen today for the evaluation of chest pain and positive troponin at the request of Dr. Jani Gravel.  History of Present Illness:   Roger [name] presented to the ED 10/18/16 with epigastric pain and diaphoresis. He also reports melena for the last 2.5 months.  The epigastric discomfort does not occur with exertion. He typically notes that when laying down and improves with sitting up. In the emergency department he was anemic with a hemoglobin of 6.2. He also had acute on chronic renal failure with a creatinine of 5.7 and potassium 5.6. Troponin was elevated to 2.3 and rose to 3.4. EKG revealed sinus rhythm with first-degree heart block and inferolateral ST depressions consistent with repolarization abnormality. Cardiology was consulted for evaluation and management.  Roger [name] also reports claudication and follows up with Roger [name].  A walking program was recommended but he does not do this because he stops walking when he gets claudication.  Of note, Roger [name] was not taking any of his prescribed medications for over a year.  He continues to smoke 1/2 ppd since age 36.    Past Medical History:  Diagnosis Date  . Anemia   . Atrial fibrillation ([place])   . DVT (deep venous thrombosis) ([place])   . GERD (gastroesophageal reflux disease)   . Gout   . Myocardial infarction ([place])   . Peripheral vascular disease ([place])   . Prostate cancer ([place])   . Renal insufficiency     History reviewed. No pertinent surgical history.   Inpatient Medications: Scheduled Meds: . amLODipine  10 mg Oral Daily  . atorvastatin  80 mg Oral q1800  . carvedilol  3.125 mg Oral BID WC  . dextrose  1  ampule Intravenous Once  . febuxostat  80 mg Oral Daily  . insulin regular  8 Units Intravenous Once  . ondansetron (ZOFRAN) IV  4 mg Intravenous Once   Continuous Infusions: . sodium chloride 75 mL/hr at 10/19/16 0448  . pantoprozole (PROTONIX) infusion 8 mg/hr (10/19/16 0556)   PRN Meds: acetaminophen **OR** acetaminophen, hydrALAZINE  Allergies:   No Known Allergies  Social History:   Social History   Social History  . Marital status: Widowed    Spouse name: N/A  . Number of children: N/A  . Years of education: N/A   Occupational History  . Not on file.   Social History Main Topics  . Smoking status: Current Every Day Smoker    Packs/day: 0.50    Years: 50.00    Types: Cigarettes  . Smokeless tobacco: Never Used  . Alcohol use No  . Drug use: No  . Sexual activity: Not on file   Other Topics Concern  . Not on file   Social History Narrative  . No narrative on file    Family History:   Family History  Problem Relation Age of Onset  . Cancer Mother   . Cancer Father   . Heart attack Father   . Diabetes Father   . Hypertension Father   . Cancer Sister   . Cancer Brother   . Diabetes Brother   . Hypertension Brother   . Hypertension Daughter  ROS:  Please see the history of present illness.  ROS  A 12 point review of systems was obtained and was negative with exceptions noted in the history of present illness.  Physical Exam/Data:   Vitals:   10/19/16 0615 10/19/16 0630 10/19/16 0745 10/19/16 1104  BP: (!) 164/65 (!) 169/66 (!) 166/61 (!) 166/62  Pulse:   (!) 58 (!) 57  Resp: 17 16 20 15   Temp:   98.1 F (36.7 C) 97.6 F (36.4 C)  TempSrc:   Oral Oral  SpO2: 97% 100% 100% 100%  Weight:      Height:        Intake/Output Summary (Last 24 hours) at 10/19/16 1524 Last data filed at 10/19/16 1445  Gross per 24 hour  Intake           1059.5 ml  Output              250 ml  Net            809.5 ml   Filed Weights   10/18/16 2033  10/19/16 0500  Weight: 99.8 kg (220 lb) 93.3 kg (205 lb 11 oz)   Body mass index is 32.22 kg/m.   VS:  BP (!) 166/62   Pulse (!) 58   Temp 98.7 F (37.1 C) (Oral)   Resp 15   Ht 5\' 7"  (1.702 m)   Wt 93.3 kg (205 lb 11 oz)   SpO2 100%   BMI 32.22 kg/m  , BMI Body mass index is 32.22 kg/m. GENERAL:  Well appearing HEENT: Pupils equal round and reactive, fundi not visualized, oral mucosa unremarkable.  Poor dentition.  Several missing teeth.  NECK:  No jugular venous distention, waveform within normal limits, carotid upstroke brisk and symmetric, no bruits, no thyromegaly LUNGS:  Clear to auscultation bilaterally.  No crackles, wheezes or rhonchi HEART:  RRR.  PMI not displaced or sustained,S1 and S2 within normal limits, no S3, no S4, no clicks, no rubs, no murmurs ABD:  +Epigastric TTP.  Flat, positive bowel sounds normal in frequency in pitch, no bruits, no rebound, no guarding, no midline pulsatile mass, no hepatomegaly, no splenomegaly EXT:  1 plus pulses throughout, no edema, no cyanosis no clubbing SKIN:  No rashes no nodules NEURO:  Cranial nerves II through XII grossly intact, motor grossly intact throughout PSYCH:  Cognitively intact, oriented to person place and time   EKG:  The EKG was personally reviewed and demonstrates:  Sinus rhythm. Rate 77 bpm. Inferolateral ST depressions consistent with repolarization abnormality.  Telemetry:  Telemetry was personally reviewed and demonstrates:  Sinus rhythm and sinus bradycardia. Rates 50s to 90s. First degree heart block.  Relevant CV Studies:  Echo pending  Laboratory Data:  Chemistry Recent Labs Lab 10/18/16 2104 10/19/16 1058  NA 139 139  K 5.6* 6.0*  CL 114* 115*  CO2 16* 18*  GLUCOSE 122* 91  BUN 49* 45*  CREATININE 5.74* 5.38*  CALCIUM 8.5* 8.4*  GFRNONAA 9* 9*  GFRAA 10* 11*  ANIONGAP 9 6     Recent Labs Lab 10/19/16 1058  PROT 6.9  ALBUMIN 3.7  AST 21  ALT 11*  ALKPHOS 122  BILITOT 0.5    Hematology Recent Labs Lab 10/18/16 2104 10/19/16 1058  WBC 7.5 8.3  RBC 2.12* 2.82*  HGB 6.2* 8.2*  HCT 19.4* 25.2*  MCV 91.5 89.4  MCH 29.2 29.1  MCHC 32.0 32.5  RDW 17.7* 17.1*  PLT 230 184   Cardiac  Enzymes Recent Labs Lab 10/19/16 0636 10/19/16 1058  TROPONINI 2.68* 3.40*    Recent Labs Lab 10/18/16 2114  TROPIPOC 2.31*    BNPNo results for input(s): BNP, PROBNP in the last 168 hours.  DDimer No results for input(s): DDIMER in the last 168 hours.  Radiology/Studies:  Ct Abdomen Pelvis Wo Contrast  Result Date: 10/19/2016 CLINICAL DATA:  75 year old male with abdominal pain. EXAM: CT ABDOMEN AND PELVIS WITHOUT CONTRAST TECHNIQUE: Multidetector CT imaging of the abdomen and pelvis was performed following the standard protocol without IV contrast. COMPARISON:  Abdominal CT dated 04/26/2009 FINDINGS: Evaluation of this exam is limited in the absence of intravenous contrast. Lower chest: The visualized lung bases are clear. There is coronary vascular calcification. There is hypoattenuation of the cardiac blood pool suggestive of a degree of anemia. Clinical correlation is recommended. No intra-abdominal free air or free fluid. Hepatobiliary: No focal liver abnormality is seen. No gallstones, gallbladder wall thickening, or biliary dilatation. Pancreas: Unremarkable. No pancreatic ductal dilatation or surrounding inflammatory changes. Spleen: Focal prominence of the superior pole of the spleen (series 2, image 16 and coronal series 5, image 124) is not well characterized and may be related to splenic morphology or represent a splenic lesion. This is new compared to the prior CT of 2011. CT with IV contrast or MRI may provide better characterization if clinically indicated. Adrenals/Urinary Tract: The adrenal glands are unremarkable. There is moderate bilateral renal atrophy, right greater than left. Subcentimeter right renal exophytic hypodense lesions are not characterized but  likely represent cysts. There is no hydronephrosis or nephrolithiasis on either side. The visualized ureters appear unremarkable. The urinary bladder is only partially distended and is grossly unremarkable. Stomach/Bowel: There are small scattered colonic as well as terminal ileum diverticula without active inflammatory changes. There is no evidence of bowel obstruction or active inflammation. Normal appendix. Vascular/Lymphatic: There is moderate aortoiliac atherosclerotic disease. Evaluation of the vasculature is limited in the absence of intravenous contrast. No portal venous gas identified. There is no adenopathy. Reproductive: The prostate gland is enlarged measuring approximately 5.8 cm in transverse diameter (previously 7.2 cm). Multiple metallic prostate biopsy clips noted. Other: None Musculoskeletal: There is multilevel degenerative changes of the spine. No acute osseous pathology. IMPRESSION: 1. No acute intra-abdominal or pelvic pathology. No bowel obstruction or active inflammation. Normal appendix. 2. Small scattered colonic and terminal ileum diverticulum without active inflammation. 3. Focal lobulated prominence of the superior pole of the spleen, incompletely characterized. CT with IV contrast or MRI may provide better characterization if clinically indicated. 4. Bilateral renal atrophy.  No hydronephrosis or nephrolithiasis. 5.  Aortic Atherosclerosis (ICD10-I70.0). Electronically Signed   By: Anner Crete M.D.   On: 10/19/2016 00:12   Dg Chest 2 View  Result Date: 10/18/2016 CLINICAL DATA:  Gastroesophageal reflux. Belching after eating. Smoker. Prostate cancer. EXAM: CHEST  2 VIEW COMPARISON:  Chest CT dated 05/05/2009. Chest radiographs dated 07/12/2004. FINDINGS: Normal sized heart. Stable minimal linear scarring at the left lung base. Otherwise, clear lungs. Aortic arch calcifications. Thoracic spine degenerative changes. IMPRESSION: No acute abnormality. Electronically Signed   By:  Claudie Revering M.D.   On: 10/18/2016 21:03    Assessment and Plan:   # Chest pain: # Elevated troponin: Given that Mr. Rakes presented in the setting of a GI bleed and anemia with a hemoglobin of 6.2, we will not proceed with any ischemic evaluation at this time  Troponin is uptrending from 2.68-->3.4.  This likely demand ischemia, though these  values are higher than expected.  This my be compounded by his kidney disease.  Echo is pending.  If he has reduced LVEF or wall motion abnormalities on echo, consider Lexiscan Myoview prior to EGD/colonoscopy.  He is not a good cath candidate at this time 2/2 GI bleed, noncompliance and acute on chronic renal failure.   Although his EKG does show inferolateral ST depressions, it seems most consistent with a repolarization pattern. Holding aspirin and heparin 2/2 GIB.   # Hypertension: Blood pressure poorly-controlled.  Carvedilol was added this admission. Heart rate is too low to titrate the dose.  We will add hydralazine 25mg  q8h.  # PAD:  Stable claudication.  Check fasting lipids.  LDL should be <70.   # Tobacco abuse: Cessation was encouraged.  He is not ready to quit at this time.  He has tried gum in the past.  Smoking cessation was discussed for 5 min.   Signed, Skeet Latch, MD  10/19/2016 3:24 PM

## 2016-10-20 ENCOUNTER — Inpatient Hospital Stay (HOSPITAL_COMMUNITY): Payer: Medicare Other

## 2016-10-20 DIAGNOSIS — I739 Peripheral vascular disease, unspecified: Secondary | ICD-10-CM

## 2016-10-20 DIAGNOSIS — N185 Chronic kidney disease, stage 5: Secondary | ICD-10-CM

## 2016-10-20 DIAGNOSIS — I214 Non-ST elevation (NSTEMI) myocardial infarction: Secondary | ICD-10-CM

## 2016-10-20 DIAGNOSIS — Z01810 Encounter for preprocedural cardiovascular examination: Secondary | ICD-10-CM

## 2016-10-20 DIAGNOSIS — K921 Melena: Secondary | ICD-10-CM

## 2016-10-20 LAB — URINALYSIS, ROUTINE W REFLEX MICROSCOPIC
BILIRUBIN URINE: NEGATIVE
Glucose, UA: NEGATIVE mg/dL
Hgb urine dipstick: NEGATIVE
KETONES UR: NEGATIVE mg/dL
LEUKOCYTES UA: NEGATIVE
NITRITE: NEGATIVE
PH: 6 (ref 5.0–8.0)
PROTEIN: NEGATIVE mg/dL
Specific Gravity, Urine: 1.01 (ref 1.005–1.030)

## 2016-10-20 LAB — COMPREHENSIVE METABOLIC PANEL
ALK PHOS: 125 U/L (ref 38–126)
ALT: 11 U/L — ABNORMAL LOW (ref 17–63)
ANION GAP: 9 (ref 5–15)
AST: 22 U/L (ref 15–41)
Albumin: 3.5 g/dL (ref 3.5–5.0)
BUN: 38 mg/dL — ABNORMAL HIGH (ref 6–20)
CALCIUM: 8 mg/dL — AB (ref 8.9–10.3)
CO2: 15 mmol/L — AB (ref 22–32)
CREATININE: 5.21 mg/dL — AB (ref 0.61–1.24)
Chloride: 114 mmol/L — ABNORMAL HIGH (ref 101–111)
GFR, EST AFRICAN AMERICAN: 11 mL/min — AB (ref >60)
GFR, EST NON AFRICAN AMERICAN: 10 mL/min — AB (ref >60)
Glucose, Bld: 88 mg/dL (ref 65–99)
Potassium: 5.1 mmol/L (ref 3.5–5.1)
SODIUM: 138 mmol/L (ref 135–145)
Total Bilirubin: 0.8 mg/dL (ref 0.3–1.2)
Total Protein: 6.6 g/dL (ref 6.5–8.1)

## 2016-10-20 LAB — ECHOCARDIOGRAM COMPLETE
Height: 67 in
Weight: 3224.01 oz

## 2016-10-20 LAB — CBC
HCT: 25.8 % — ABNORMAL LOW (ref 39.0–52.0)
HEMOGLOBIN: 8.2 g/dL — AB (ref 13.0–17.0)
MCH: 28.5 pg (ref 26.0–34.0)
MCHC: 31.8 g/dL (ref 30.0–36.0)
MCV: 89.6 fL (ref 78.0–100.0)
PLATELETS: 210 10*3/uL (ref 150–400)
RBC: 2.88 MIL/uL — ABNORMAL LOW (ref 4.22–5.81)
RDW: 17.4 % — ABNORMAL HIGH (ref 11.5–15.5)
WBC: 8.1 10*3/uL (ref 4.0–10.5)

## 2016-10-20 LAB — LIPID PANEL
CHOLESTEROL: 208 mg/dL — AB (ref 0–200)
HDL: 23 mg/dL — AB (ref >40)
LDL Cholesterol: 140 mg/dL — ABNORMAL HIGH (ref 0–99)
Total CHOL/HDL Ratio: 9 RATIO
Triglycerides: 225 mg/dL — ABNORMAL HIGH (ref <150)
VLDL: 45 mg/dL — ABNORMAL HIGH (ref 0–40)

## 2016-10-20 LAB — OCCULT BLOOD X 1 CARD TO LAB, STOOL: FECAL OCCULT BLD: POSITIVE — AB

## 2016-10-20 LAB — SODIUM, URINE, RANDOM: SODIUM UR: 112 mmol/L

## 2016-10-20 MED ORDER — PEG-KCL-NACL-NASULF-NA ASC-C 100 G PO SOLR
0.5000 | Freq: Once | ORAL | Status: AC
  Administered 2016-10-20: 100 g via ORAL
  Filled 2016-10-20: qty 1

## 2016-10-20 MED ORDER — PEG-KCL-NACL-NASULF-NA ASC-C 100 G PO SOLR: 1.0000 | Freq: Once | ORAL | Status: DC

## 2016-10-20 MED ORDER — PERFLUTREN LIPID MICROSPHERE
1.0000 mL | INTRAVENOUS | Status: AC | PRN
  Administered 2016-10-20: 4 mL via INTRAVENOUS
  Filled 2016-10-20: qty 10

## 2016-10-20 MED ORDER — HYDRALAZINE HCL 50 MG PO TABS: 25.0000 mg | ORAL_TABLET | Freq: Three times a day (TID) | ORAL | Status: DC

## 2016-10-20 MED ORDER — PEG-KCL-NACL-NASULF-NA ASC-C 100 G PO SOLR
0.5000 | Freq: Once | ORAL | Status: AC
  Administered 2016-10-21: 100 g via ORAL
  Filled 2016-10-20: qty 1

## 2016-10-20 NOTE — Progress Notes (Signed)
[facility] Progress Note  Subjective: no c/o's, no CP or SOB  Vitals:   10/19/16 2345 10/20/16 0330 10/20/16 0606 10/20/16 0714  BP: (!) 162/45 (!) 148/49  (!) 156/53  Pulse: 71 (!) 59  60  Resp: (!) 22 (!) 23  19  Temp: 98.6 F (37 C) 99 F (37.2 C)  98.6 F (37 C)  TempSrc: Oral Oral  Oral  SpO2: 100% 98%    Weight:   91.4 kg (201 lb 8 oz)   Height:        Inpatient medications: . amLODipine  10 mg Oral Daily  . atorvastatin  80 mg Oral q1800  . carvedilol  3.125 mg Oral BID WC  . febuxostat  80 mg Oral Daily  . hydrALAZINE  25 mg Oral Q8H  . ondansetron (ZOFRAN) IV  4 mg Intravenous Once   . pantoprozole (PROTONIX) infusion 8 mg/hr (10/19/16 1900)   acetaminophen **OR** acetaminophen, hydrALAZINE, perflutren lipid microspheres (DEFINITY) IV suspension  Exam: Gen older adult AAM, no distress, calm No jvd or bruits Chest clear bilat RRR no MRG Abd soft ntnd no mass or ascites +bs GU normal male MS no joint effusions or deformity Ext no LE edema Neuro is alert, Ox 3 , nf, no asterixis    Creat               Date                 eGFR 2012                2.7- 3.5 2013                1.95- 2.49 2014                2.42- 3.32        20- 25 10/18/16              5.74 10/19/16              5.38                 10           Impression: 1  Renal failure - eGFR in 2014 was 20 ml/min and was f/b [facility] neph[facility] but hasn't keep appts for a year or two. Now creat 5 and pt w uremic symptoms.  Have discussed options and pt wants to proceed with dialysis. Have explained need for [facility] and perm access, CLIP process and initiation of HD to pt and daughter and questions answered.  Spoke w [facility] on call and they will put patient on for new HD access , prob Tuesday.   3  Rectal bleeding/ anemia - Hb 8.2 4  HTN - added hydralazine to coreg/ norvasc today 5  AFib  6  Hx DVT 7  CAD hx MI 8  PAD hx claudication 9  Hx prostate cancer rx seed implants 10 Hyperkalemia  better   Plan - as above. Renal diet, HD when access established.    Kelly Splinter MD [facility] pager [phone]   10/20/2016, 9:59 AM    Recent Labs Lab 10/18/16 2104 10/19/16 0636 10/19/16 1058 10/20/16 0700  NA 139  --  139 138  K 5.6*  --  6.0* 5.1  CL 114*  --  115* 114*  CO2 16*  --  18* 15*  GLUCOSE 122*  --  91 88  BUN 49*  --  45* 38*  CREATININE 5.74*  --  5.38* 5.21*  CALCIUM 8.5*  --  8.4* 8.0*  PHOS  --  4.2  --   --     Recent Labs Lab 10/19/16 1058 10/20/16 0700  AST 21 22  ALT 11* 11*  ALKPHOS 122 125  BILITOT 0.5 0.8  PROT 6.9 6.6  ALBUMIN 3.7 3.5    Recent Labs Lab 10/18/16 2104 10/19/16 1058 10/20/16 0700  WBC 7.5 8.3 8.1  HGB 6.2* 8.2* 8.2*  HCT 19.4* 25.2* 25.8*  MCV 91.5 89.4 89.6  PLT 230 184 210   Iron/TIBC/Ferritin/ %Sat    Component Value Date/Time   IRON 73 10/19/2016 0636   TIBC 291 10/19/2016 0636   FERRITIN 66 10/19/2016 0636   IRONPCTSAT 25 10/19/2016 0636

## 2016-10-20 NOTE — Progress Notes (Signed)
PROGRESS NOTE    Roger [name]  AOZ:[id number] DOB: 1941/01/02 DOA: 10/18/2016 PCP: [name], MD     Brief Narrative:   76 y.o. male, w CKD stage4/5 PVD, Pafib ,  Anemia, apparently went out to eat and then c/o nv, no bloody emesis. + diaphoresis,  + burning pain in chest for a while, Pt notes blood in stool (black stool) and epigastric discomfort for 1-2 months.     Assessment & Plan:   1-Acute GI bleed most likely upper with acute blood loss anemia: Status post transfusion of 2  units  packed RBCs ,  hemoglobin of 8 stable , no more apparent GI bleed, continue PPI drip, clear liquid diet for now , plan for EGD/Colonoscopy tomorrow. 2-type II non-ST elevation MI in the setting of supply demand mismatch causing the severe anemia in the setting of GI bleed chest pain-free for now, continue beta blocker , hold aspirin or any anticoagulation in the setting of GI bleed . 3-AKI/CKD : Related to dehydration and anemia, complicated with hyperkalemia,creat is trending down slowly to his baseline , hyperkalemia resolved , may need HD in the near future . 4-Hx of PAD/CKD/Prostate cancer. 5-HTN : Continue coreg/Norvasc , Hydralazine added.    DVT prophylaxis: SCD. Code Status: (Full) Family Communication: (daughter ) Disposition Plan: (home )   Consultants:   GI  Procedures:    Antimicrobials:NONE   Subjective:  The patient today feels hungry, he denies any chest pain, nausea or vomiting, denies any black tarry stools, complains of epigastric discomfort no other complaints.  Objective: Vitals:   10/19/16 2345 10/20/16 0330 10/20/16 0606 10/20/16 0714  BP: (!) 162/45 (!) 148/49  (!) 156/53  Pulse: 71 (!) 59  60  Resp: (!) 22 (!) 23  19  Temp: 98.6 F (37 C) 99 F (37.2 C)  98.6 F (37 C)  TempSrc: Oral Oral  Oral  SpO2: 100% 98%    Weight:   91.4 kg (201 lb 8 oz)   Height:        Intake/Output Summary (Last 24 hours) at 10/20/16 0939 Last data filed at 10/19/16  2345  Gross per 24 hour  Intake           326.67 ml  Output              850 ml  Net          -523.33 ml   Filed Weights   10/18/16 2033 10/19/16 0500 10/20/16 0606  Weight: 99.8 kg (220 lb) 93.3 kg (205 lb 11 oz) 91.4 kg (201 lb 8 oz)    Examination:  General exam: NAD. Respiratory system: Clear to auscultation. Respiratory effort normal. Cardiovascular system: S1 & S2 heard, RRR. No JVD, murmurs, rubs, gallops or clicks. No pedal edema. Gastrointestinal system: Epigastric tenderness no rebound or rigidity . Central nervous system: Alert and oriented. No focal neurological deficits. Extremities: Symmetric 5 x 5 power. Skin: No rashes, lesions or ulcers Psychiatry: Judgement and insight appear normal. Mood & affect appropriate.     Data Reviewed: I have personally reviewed following labs and imaging studies  CBC:  Recent Labs Lab 10/18/16 2104 10/19/16 1058 10/20/16 0700  WBC 7.5 8.3 8.1  HGB 6.2* 8.2* 8.2*  HCT 19.4* 25.2* 25.8*  MCV 91.5 89.4 89.6  PLT 230 184 962   Basic Metabolic Panel:  Recent Labs Lab 10/18/16 2104 10/19/16 0636 10/19/16 1058 10/20/16 0700  NA 139  --  139 138  K 5.6*  --  6.0* 5.1  CL 114*  --  115* 114*  CO2 16*  --  18* 15*  GLUCOSE 122*  --  91 88  BUN 49*  --  45* 38*  CREATININE 5.74*  --  5.38* 5.21*  CALCIUM 8.5*  --  8.4* 8.0*  MG  --  2.0  --   --   PHOS  --  4.2  --   --    GFR: Estimated Creatinine Clearance: 13.6 mL/min (A) (by C-G formula based on SCr of 5.21 mg/dL (H)). Liver Function Tests:  Recent Labs Lab 10/19/16 1058 10/20/16 0700  AST 21 22  ALT 11* 11*  ALKPHOS 122 125  BILITOT 0.5 0.8  PROT 6.9 6.6  ALBUMIN 3.7 3.5   No results for input(s): LIPASE, AMYLASE in the last 168 hours. No results for input(s): AMMONIA in the last 168 hours. Coagulation Profile: No results for input(s): INR, PROTIME in the last 168 hours. Cardiac Enzymes:  Recent Labs Lab 10/19/16 0636 10/19/16 1058 10/19/16 1559   TROPONINI 2.68* 3.40* 2.98*   BNP (last 3 results) No results for input(s): PROBNP in the last 8760 hours. HbA1C: No results for input(s): HGBA1C in the last 72 hours. CBG: No results for input(s): GLUCAP in the last 168 hours. Lipid Profile:  Recent Labs  10/20/16 0228  CHOL 208*  HDL 23*  LDLCALC 140*  TRIG 225*  CHOLHDL 9.0   Thyroid Function Tests:  Recent Labs  10/19/16 0637  TSH 2.539   Anemia Panel:  Recent Labs  10/19/16 0636  VITAMINB12 243  FERRITIN 66  TIBC 291  IRON 73   Urine analysis:    Component Value Date/Time   COLORURINE YELLOW 10/20/2016 0255   APPEARANCEUR CLEAR 10/20/2016 0255   LABSPEC 1.010 10/20/2016 0255   LABSPEC 1.020 12/26/2009 1533   PHURINE 6.0 10/20/2016 0255   GLUCOSEU NEGATIVE 10/20/2016 0255   HGBUR NEGATIVE 10/20/2016 0255   BILIRUBINUR NEGATIVE 10/20/2016 0255   BILIRUBINUR Negative 12/26/2009 1533   KETONESUR NEGATIVE 10/20/2016 0255   PROTEINUR NEGATIVE 10/20/2016 0255   NITRITE NEGATIVE 10/20/2016 0255   LEUKOCYTESUR NEGATIVE 10/20/2016 0255   LEUKOCYTESUR Negative 12/26/2009 1533   Sepsis Labs: @LABRCNTIP (procalcitonin:4,lacticidven:4)  ) Recent Results (from the past 240 hour(s))  MRSA PCR Screening     Status: None   Collection Time: 10/19/16  5:20 AM  Result Value Ref Range Status   MRSA by PCR NEGATIVE NEGATIVE Final    Comment:        The GeneXpert MRSA Assay (FDA approved for NASAL specimens only), is one component of a comprehensive MRSA colonization surveillance program. It is not intended to diagnose MRSA infection nor to guide or monitor treatment for MRSA infections.          Radiology Studies: Ct Abdomen Pelvis Wo Contrast  Result Date: 10/19/2016 CLINICAL DATA:  76 year old male with abdominal pain. EXAM: CT ABDOMEN AND PELVIS WITHOUT CONTRAST TECHNIQUE: Multidetector CT imaging of the abdomen and pelvis was performed following the standard protocol without IV contrast.  COMPARISON:  Abdominal CT dated 04/26/2009 FINDINGS: Evaluation of this exam is limited in the absence of intravenous contrast. Lower chest: The visualized lung bases are clear. There is coronary vascular calcification. There is hypoattenuation of the cardiac blood pool suggestive of a degree of anemia. Clinical correlation is recommended. No intra-abdominal free air or free fluid. Hepatobiliary: No focal liver abnormality is seen. No gallstones, gallbladder wall thickening, or biliary dilatation. Pancreas: Unremarkable. No pancreatic ductal dilatation or  surrounding inflammatory changes. Spleen: Focal prominence of the superior pole of the spleen (series 2, image 16 and coronal series 5, image 124) is not well characterized and may be related to splenic morphology or represent a splenic lesion. This is new compared to the prior CT of 2011. CT with IV contrast or MRI may provide better characterization if clinically indicated. Adrenals/Urinary Tract: The adrenal glands are unremarkable. There is moderate bilateral renal atrophy, right greater than left. Subcentimeter right renal exophytic hypodense lesions are not characterized but likely represent cysts. There is no hydronephrosis or nephrolithiasis on either side. The visualized ureters appear unremarkable. The urinary bladder is only partially distended and is grossly unremarkable. Stomach/Bowel: There are small scattered colonic as well as terminal ileum diverticula without active inflammatory changes. There is no evidence of bowel obstruction or active inflammation. Normal appendix. Vascular/Lymphatic: There is moderate aortoiliac atherosclerotic disease. Evaluation of the vasculature is limited in the absence of intravenous contrast. No portal venous gas identified. There is no adenopathy. Reproductive: The prostate gland is enlarged measuring approximately 5.8 cm in transverse diameter (previously 7.2 cm). Multiple metallic prostate biopsy clips noted. Other:  None Musculoskeletal: There is multilevel degenerative changes of the spine. No acute osseous pathology. IMPRESSION: 1. No acute intra-abdominal or pelvic pathology. No bowel obstruction or active inflammation. Normal appendix. 2. Small scattered colonic and terminal ileum diverticulum without active inflammation. 3. Focal lobulated prominence of the superior pole of the spleen, incompletely characterized. CT with IV contrast or MRI may provide better characterization if clinically indicated. 4. Bilateral renal atrophy.  No hydronephrosis or nephrolithiasis. 5.  Aortic Atherosclerosis (ICD10-I70.0). Electronically Signed   By: Anner Crete M.D.   On: 10/19/2016 00:12   Dg Chest 2 View  Result Date: 10/18/2016 CLINICAL DATA:  Gastroesophageal reflux. Belching after eating. Smoker. Prostate cancer. EXAM: CHEST  2 VIEW COMPARISON:  Chest CT dated 05/05/2009. Chest radiographs dated 07/12/2004. FINDINGS: Normal sized heart. Stable minimal linear scarring at the left lung base. Otherwise, clear lungs. Aortic arch calcifications. Thoracic spine degenerative changes. IMPRESSION: No acute abnormality. Electronically Signed   By: Claudie Revering M.D.   On: 10/18/2016 21:03        Scheduled Meds: . amLODipine  10 mg Oral Daily  . atorvastatin  80 mg Oral q1800  . carvedilol  3.125 mg Oral BID WC  . febuxostat  80 mg Oral Daily  . hydrALAZINE  25 mg Oral Q8H  . ondansetron (ZOFRAN) IV  4 mg Intravenous Once   Continuous Infusions: . pantoprozole (PROTONIX) infusion 8 mg/hr (10/19/16 1900)     LOS: 2 days    Time spent: [place], MD [facility] Pager [phone]  If 7PM-7AM, please contact night-coverage [email or web address] Password [facility] 10/20/2016, 9:39 AM

## 2016-10-20 NOTE — Progress Notes (Signed)
[facility] ROUNDING NOTE   Subjective: C/o abdominal pain and says its hunger pain.    Objective: Vital signs in last 24 hours: Temp:  [97.6 F (36.4 C)-99 F (37.2 C)] 98.6 F (37 C) (08/05 0714) Pulse Rate:  [53-71] 60 (08/05 0714) Resp:  [15-23] 19 (08/05 0714) BP: (148-169)/(45-64) 156/53 (08/05 0714) SpO2:  [98 %-100 %] 98 % (08/05 0330) Weight:  [201 lb 8 oz (91.4 kg)] 201 lb 8 oz (91.4 kg) (08/05 0606) Last BM Date: 10/19/16 General: NAD Abdomen:soft, distended, diffuse generalized tenderness   Intake/Output from previous day: 08/04 0701 - 08/05 0700 In: 326.7 [I.V.:326.7] Out: 850 [Urine:850] Intake/Output this shift: No intake/output data recorded.   Lab Results:  Recent Labs  10/18/16 2104 10/19/16 1058 10/20/16 0700  WBC 7.5 8.3 8.1  HGB 6.2* 8.2* 8.2*  PLT 230 184 210  MCV 91.5 89.4 89.6   BMET  Recent Labs  10/18/16 2104 10/19/16 1058 10/20/16 0700  NA 139 139 138  K 5.6* 6.0* 5.1  CL 114* 115* 114*  CO2 16* 18* 15*  GLUCOSE 122* 91 88  BUN 49* 45* 38*  CREATININE 5.74* 5.38* 5.21*  CALCIUM 8.5* 8.4* 8.0*   LFT  Recent Labs  10/19/16 1058 10/20/16 0700  PROT 6.9 6.6  ALBUMIN 3.7 3.5  AST 21 22  ALT 11* 11*  ALKPHOS 122 125  BILITOT 0.5 0.8   PT/INR No results for input(s): INR in the last 72 hours.    Imaging/Other results: Ct Abdomen Pelvis Wo Contrast  Result Date: 10/19/2016 CLINICAL DATA:  76 year old male with abdominal pain. EXAM: CT ABDOMEN AND PELVIS WITHOUT CONTRAST TECHNIQUE: Multidetector CT imaging of the abdomen and pelvis was performed following the standard protocol without IV contrast. COMPARISON:  Abdominal CT dated 04/26/2009 FINDINGS: Evaluation of this exam is limited in the absence of intravenous contrast. Lower chest: The visualized lung bases are clear. There is coronary vascular calcification. There is hypoattenuation of the cardiac blood pool suggestive of a degree of anemia. Clinical  correlation is recommended. No intra-abdominal free air or free fluid. Hepatobiliary: No focal liver abnormality is seen. No gallstones, gallbladder wall thickening, or biliary dilatation. Pancreas: Unremarkable. No pancreatic ductal dilatation or surrounding inflammatory changes. Spleen: Focal prominence of the superior pole of the spleen (series 2, image 16 and coronal series 5, image 124) is not well characterized and may be related to splenic morphology or represent a splenic lesion. This is new compared to the prior CT of 2011. CT with IV contrast or MRI may provide better characterization if clinically indicated. Adrenals/Urinary Tract: The adrenal glands are unremarkable. There is moderate bilateral renal atrophy, right greater than left. Subcentimeter right renal exophytic hypodense lesions are not characterized but likely represent cysts. There is no hydronephrosis or nephrolithiasis on either side. The visualized ureters appear unremarkable. The urinary bladder is only partially distended and is grossly unremarkable. Stomach/Bowel: There are small scattered colonic as well as terminal ileum diverticula without active inflammatory changes. There is no evidence of bowel obstruction or active inflammation. Normal appendix. Vascular/Lymphatic: There is moderate aortoiliac atherosclerotic disease. Evaluation of the vasculature is limited in the absence of intravenous contrast. No portal venous gas identified. There is no adenopathy. Reproductive: The prostate gland is enlarged measuring approximately 5.8 cm in transverse diameter (previously 7.2 cm). Multiple metallic prostate biopsy clips noted. Other: None Musculoskeletal: There is multilevel degenerative changes of the spine. No acute osseous pathology. IMPRESSION: 1. No acute intra-abdominal or pelvic pathology. No bowel  obstruction or active inflammation. Normal appendix. 2. Small scattered colonic and terminal ileum diverticulum without active  inflammation. 3. Focal lobulated prominence of the superior pole of the spleen, incompletely characterized. CT with IV contrast or MRI may provide better characterization if clinically indicated. 4. Bilateral renal atrophy.  No hydronephrosis or nephrolithiasis. 5.  Aortic Atherosclerosis (ICD10-I70.0). Electronically Signed   By: Anner Crete M.D.   On: 10/19/2016 00:12   Dg Chest 2 View  Result Date: 10/18/2016 CLINICAL DATA:  Gastroesophageal reflux. Belching after eating. Smoker. Prostate cancer. EXAM: CHEST  2 VIEW COMPARISON:  Chest CT dated 05/05/2009. Chest radiographs dated 07/12/2004. FINDINGS: Normal sized heart. Stable minimal linear scarring at the left lung base. Otherwise, clear lungs. Aortic arch calcifications. Thoracic spine degenerative changes. IMPRESSION: No acute abnormality. Electronically Signed   By: Claudie Revering M.D.   On: 10/18/2016 21:03      Assessment &Plan  76 year old male with CAD, acute on chronic kidney injury likely progression of CKD per nephrology admitted with worsening anemia and intermittent melena and rectal bleeding. Troponin elevated to 3.4 yesterday and has trended down since. Per cardiology elevated troponin secondary to demand ischemia. He has normal LV function and no further workup recommended from a cardiac standpoint. Nephrology recommended initiating dialysis but patient is reluctant. Potassium improved to 5.1 this morning. Hemoglobin stable status post transfusion We'll tentatively plan for EGD and colonoscopy tomorrow Clear liquids Start bowel prep Nothing by mouth after midnight Monitor hemoglobin and transfuse as needed Recheck CBC, CMP and PT INR in the morning    K. Denzil Magnuson , MD [phone] Mon-Fri 8a-5p [phone] after 5p, weekends, holidays [facility]

## 2016-10-20 NOTE — Progress Notes (Signed)
  Echocardiogram 2D Echocardiogram with Definity has been performed.  Darlina Sicilian M 10/20/2016, 9:11 AM

## 2016-10-20 NOTE — Consult Note (Signed)
Patient name: Roger [name] MRN: [id number] DOB: Jul 15, 1940 Sex: male   REASON FOR CONSULT:    Needs hemodialysis access. The consult is requested by Dr. Jonnie Finner.  HPI:   Roger [name] is a pleasant 76 y.o. male, who was admitted on 10/19/2015 with nausea. He has a history of chronic kidney disease. During this admission he was having some chest pain and had a positive troponin. He is being followed by cardiology. He is being worked up for a GI bleed and anemia and I believe he is scheduled for endoscopy tomorrow. We will consult because he has worsening renal insufficiency and it is felt that he will ultimately need dialysis. We were asked to place a tunneled dialysis catheter and long-term access.  He is right-handed.  Of note, I saw this patient 3 years ago with peripheral vascular disease. He had evidence of infrainguinal arterial occlusive disease bilaterally. ABI on the right was 56% and ABI on the left 57%. He had stable claudication and we discussed conservative treatment. He was to follow up in 1 year but was lost to follow up.  Past Medical History:  Diagnosis Date  . Anemia   . Atrial fibrillation ([place])   . DVT (deep venous thrombosis) ([place])   . GERD (gastroesophageal reflux disease)   . Gout   . Myocardial infarction ([place])   . Peripheral vascular disease ([place])   . Prostate cancer ([place])   . Renal insufficiency     Family History  Problem Relation Age of Onset  . Cancer Mother   . Cancer Father   . Heart attack Father   . Diabetes Father   . Hypertension Father   . Cancer Sister   . Cancer Brother   . Diabetes Brother   . Hypertension Brother   . Hypertension Daughter     SOCIAL HISTORY: Social History   Social History  . Marital status: Widowed    Spouse name: N/A  . Number of children: N/A  . Years of education: N/A   Occupational History  . Not on file.   Social History Main Topics  . Smoking status: Current Every Day Smoker    Packs/day:  0.50    Years: 50.00    Types: Cigarettes  . Smokeless tobacco: Never Used  . Alcohol use No  . Drug use: No  . Sexual activity: Not on file   Other Topics Concern  . Not on file   Social History Narrative  . No narrative on file    No Known Allergies  Current Facility-Administered Medications  Medication Dose Route Frequency Provider Last Rate Last Dose  . acetaminophen (TYLENOL) tablet 650 mg  650 mg Oral Q6H PRN Jani Gravel, MD       Or  . acetaminophen (TYLENOL) suppository 650 mg  650 mg Rectal Q6H PRN Jani Gravel, MD      . amLODipine (NORVASC) tablet 10 mg  10 mg Oral Daily Jani Gravel, MD   10 mg at 10/20/16 0944  . atorvastatin (LIPITOR) tablet 80 mg  80 mg Oral q1800 Jani Gravel, MD   80 mg at 10/19/16 1851  . carvedilol (COREG) tablet 3.125 mg  3.125 mg Oral BID WC Waldron Session, MD   3.125 mg at 10/20/16 0944  . febuxostat (ULORIC) tablet 80 mg  80 mg Oral Daily Jani Gravel, MD   80 mg at 10/20/16 0944  . hydrALAZINE (APRESOLINE) injection 5 mg  5 mg Intravenous Q6H PRN Jani Gravel, MD      .  hydrALAZINE (APRESOLINE) tablet 25 mg  25 mg Oral Q8H Skeet Latch, MD   25 mg at 10/20/16 0758  . ondansetron (ZOFRAN) injection 4 mg  4 mg Intravenous Once Cardama, Grayce Sessions, MD      . pantoprazole (PROTONIX) 80 mg in sodium chloride 0.9 % 250 mL (0.32 mg/mL) infusion  8 mg/hr Intravenous Continuous Jani Gravel, MD 25 mL/hr at 10/19/16 1900 8 mg/hr at 10/19/16 1900  . perflutren lipid microspheres (DEFINITY) IV suspension  1-10 mL Intravenous PRN Jani Gravel, MD   4 mL at 10/20/16 0904    REVIEW OF SYSTEMS:  [X]  denotes positive finding, [ ]  denotes negative finding Cardiac  Comments:  Chest pain or chest pressure: X   Shortness of breath upon exertion: X   Short of breath when lying flat:    Irregular heart rhythm:        Vascular    Pain in calf, thigh, or hip brought on by ambulation: X   Pain in feet at night that wakes you up from your sleep:     Blood clot in your  veins:    Leg swelling:         Pulmonary    Oxygen at home:    Productive cough:     Wheezing:         Neurologic    Sudden weakness in arms or legs:     Sudden numbness in arms or legs:     Sudden onset of difficulty speaking or slurred speech:    Temporary loss of vision in one eye:     Problems with dizziness:         Gastrointestinal    Blood in stool:     Vomited blood:  X       Genitourinary    Burning when urinating:     Blood in urine:        Psychiatric    Major depression:         Hematologic    Bleeding problems:    Problems with blood clotting too easily:        Skin    Rashes or ulcers:        Constitutional    Fever or chills:     PHYSICAL EXAM:   Vitals:   10/19/16 2345 10/20/16 0330 10/20/16 0606 10/20/16 0714  BP: (!) 162/45 (!) 148/49  (!) 156/53  Pulse: 71 (!) 59  60  Resp: (!) 22 (!) 23  19  Temp: 98.6 F (37 C) 99 F (37.2 C)  98.6 F (37 C)  TempSrc: Oral Oral  Oral  SpO2: 100% 98%    Weight:   201 lb 8 oz (91.4 kg)   Height:        GENERAL: The patient is a well-nourished male, in no acute distress. The vital signs are documented above. CARDIAC: There is a regular rate and rhythm.  VASCULAR: I do not detect carotid bruits. He has palpable radial pulses bilaterally. He has an IV in both the right and the left antecubital veins. I cannot palpate pedal pulses. PULMONARY: There is good air exchange bilaterally without wheezing or rales. ABDOMEN: Soft and non-tender with normal pitched bowel sounds. He is obese and it is difficult to assess for aneurysm. MUSCULOSKELETAL: There are no major deformities or cyanosis. NEUROLOGIC: No focal weakness or paresthesias are detected. SKIN: There are no ulcers or rashes noted. PSYCHIATRIC: The patient has a normal affect.  DATA:    VEIN  MAP: I have ordered this and this is pending.  ECHO: Results are pending.  LABS:  Hemoglobin is 8.2. Platelet count 210,000.  GFR is 11.  MEDICAL  ISSUES:   STAGE V CHRONIC KIDNEY DISEASE: The patient is now agreeable to proceed with hemodialysis. I have ordered a vein map of both upper extremities. Ending the results of his vein map we can remove one of his IVs. He has an IV in both arms. He will need a tunneled dialysis catheter and an AV fistula. We could potentially schedule this for Tuesday or Wednesday if his other workup is complete. He is being followed by cardiology because of chest pain and elevated troponins. He is also I believe scheduled for upper endoscopy tomorrow because of possible upper GI bleed. I have discussed the indications for placement of a catheter and placement of a fistula. We've also discussed the potential complications at this point he is agreeable to proceed later next week.  [name] [facility] [phone]

## 2016-10-20 NOTE — Progress Notes (Signed)
Right  Upper Extremity Vein Map    Cephalic  Segment Diameter Depth Comment  1. Axilla 5.44mm 26mm   2. Mid upper arm 4.26mm 2.19mm   3. Above AC 1.11mm 3.58mm   4. In [facility] 5.4mm 4.6mm   5. Below AC 4.43mm 54mm branch  6. Mid forearm 94mm 8.27mm   7. Wrist 1.1mm 1.107mm    mm mm    mm mm    mm mm     Left Upper Extremity Vein Map    Cephalic  Segment Diameter Depth Comment  1. Axilla 2.66mm 64mm   2. Mid upper arm 3.29mm 5.30mm branch  3. Above AC 2.27mm 2.33mm   4. In AC 3.48mm 5.69mm   5. Below AC 3.7mm 2.32mm   6. Mid forearm 1.66mm 3.35mm   7. Wrist 1.82mm 5.95mm    mm mm    mm mm    mm mm    Basilic  Segment Diameter Depth Comment  1. Axilla 6.6mm 33mm   2. Mid upper arm 4.21mm 64mm   3. Above AC 4.44mm 25mm   4. In AC mm mm Thrombosis  5. Below AC mm mm IV bandages  6. Mid forearm mm mm IV bandages  7. Wrist mm mm IV bandages   mm mm    mm mm    mm mm

## 2016-10-20 NOTE — Progress Notes (Signed)
Progress Note  Patient Name: Roger [name] Date of Encounter: 10/20/2016  Primary Cardiologist: Randoph  Subjective   No chest or abdominal pain or sob.  Inpatient Medications    Scheduled Meds: . amLODipine  10 mg Oral Daily  . atorvastatin  80 mg Oral q1800  . carvedilol  3.125 mg Oral BID WC  . febuxostat  80 mg Oral Daily  . hydrALAZINE  25 mg Oral Q8H  . ondansetron (ZOFRAN) IV  4 mg Intravenous Once   Continuous Infusions: . pantoprozole (PROTONIX) infusion 8 mg/hr (10/19/16 1900)   PRN Meds: acetaminophen **OR** acetaminophen, hydrALAZINE   Vital Signs    Vitals:   10/19/16 2345 10/20/16 0330 10/20/16 0606 10/20/16 0714  BP: (!) 162/45 (!) 148/49  (!) 156/53  Pulse: 71 (!) 59  60  Resp: (!) 22 (!) 23  19  Temp: 98.6 F (37 C) 99 F (37.2 C)  98.6 F (37 C)  TempSrc: Oral Oral  Oral  SpO2: 100% 98%    Weight:   201 lb 8 oz (91.4 kg)   Height:        Intake/Output Summary (Last 24 hours) at 10/20/16 0836 Last data filed at 10/19/16 2345  Gross per 24 hour  Intake           326.67 ml  Output              850 ml  Net          -523.33 ml   Filed Weights   10/18/16 2033 10/19/16 0500 10/20/16 0606  Weight: 220 lb (99.8 kg) 205 lb 11 oz (93.3 kg) 201 lb 8 oz (91.4 kg)    Telemetry    nsr - Personally Reviewed  ECG    nsr with first degree AV block and inferolateral ST depression - Personally Reviewed  Physical Exam   GEN: No acute distress.   Neck: 6 cm JVD Cardiac: RRR, no murmurs, rubs, or gallops.  Respiratory: Clear to auscultation bilaterally. GI: Soft, nontender, non-distended  MS: No edema; No deformity. Neuro:  Nonfocal  Psych: Normal affect   Labs    Chemistry Recent Labs Lab 10/18/16 2104 10/19/16 1058 10/20/16 0700  NA 139 139 138  K 5.6* 6.0* 5.1  CL 114* 115* 114*  CO2 16* 18* 15*  GLUCOSE 122* 91 88  BUN 49* 45* 38*  CREATININE 5.74* 5.38* 5.21*  CALCIUM 8.5* 8.4* 8.0*  PROT  --  6.9 6.6  ALBUMIN  --  3.7 3.5   AST  --  21 22  ALT  --  11* 11*  ALKPHOS  --  122 125  BILITOT  --  0.5 0.8  GFRNONAA 9* 9* 10*  GFRAA 10* 11* 11*  ANIONGAP 9 6 9      Hematology Recent Labs Lab 10/18/16 2104 10/19/16 1058 10/20/16 0700  WBC 7.5 8.3 8.1  RBC 2.12* 2.82* 2.88*  HGB 6.2* 8.2* 8.2*  HCT 19.4* 25.2* 25.8*  MCV 91.5 89.4 89.6  MCH 29.2 29.1 28.5  MCHC 32.0 32.5 31.8  RDW 17.7* 17.1* 17.4*  PLT 230 184 210    Cardiac Enzymes Recent Labs Lab 10/19/16 0636 10/19/16 1058 10/19/16 1559  TROPONINI 2.68* 3.40* 2.98*    Recent Labs Lab 10/18/16 2114  TROPIPOC 2.31*     BNPNo results for input(s): BNP, PROBNP in the last 168 hours.   DDimer No results for input(s): DDIMER in the last 168 hours.   Radiology    Ct  Abdomen Pelvis Wo Contrast  Result Date: 10/19/2016 CLINICAL DATA:  76 year old male with abdominal pain. EXAM: CT ABDOMEN AND PELVIS WITHOUT CONTRAST TECHNIQUE: Multidetector CT imaging of the abdomen and pelvis was performed following the standard protocol without IV contrast. COMPARISON:  Abdominal CT dated 04/26/2009 FINDINGS: Evaluation of this exam is limited in the absence of intravenous contrast. Lower chest: The visualized lung bases are clear. There is coronary vascular calcification. There is hypoattenuation of the cardiac blood pool suggestive of a degree of anemia. Clinical correlation is recommended. No intra-abdominal free air or free fluid. Hepatobiliary: No focal liver abnormality is seen. No gallstones, gallbladder wall thickening, or biliary dilatation. Pancreas: Unremarkable. No pancreatic ductal dilatation or surrounding inflammatory changes. Spleen: Focal prominence of the superior pole of the spleen (series 2, image 16 and coronal series 5, image 124) is not well characterized and may be related to splenic morphology or represent a splenic lesion. This is new compared to the prior CT of 2011. CT with IV contrast or MRI may provide better characterization if  clinically indicated. Adrenals/Urinary Tract: The adrenal glands are unremarkable. There is moderate bilateral renal atrophy, right greater than left. Subcentimeter right renal exophytic hypodense lesions are not characterized but likely represent cysts. There is no hydronephrosis or nephrolithiasis on either side. The visualized ureters appear unremarkable. The urinary bladder is only partially distended and is grossly unremarkable. Stomach/Bowel: There are small scattered colonic as well as terminal ileum diverticula without active inflammatory changes. There is no evidence of bowel obstruction or active inflammation. Normal appendix. Vascular/Lymphatic: There is moderate aortoiliac atherosclerotic disease. Evaluation of the vasculature is limited in the absence of intravenous contrast. No portal venous gas identified. There is no adenopathy. Reproductive: The prostate gland is enlarged measuring approximately 5.8 cm in transverse diameter (previously 7.2 cm). Multiple metallic prostate biopsy clips noted. Other: None Musculoskeletal: There is multilevel degenerative changes of the spine. No acute osseous pathology. IMPRESSION: 1. No acute intra-abdominal or pelvic pathology. No bowel obstruction or active inflammation. Normal appendix. 2. Small scattered colonic and terminal ileum diverticulum without active inflammation. 3. Focal lobulated prominence of the superior pole of the spleen, incompletely characterized. CT with IV contrast or MRI may provide better characterization if clinically indicated. 4. Bilateral renal atrophy.  No hydronephrosis or nephrolithiasis. 5.  Aortic Atherosclerosis (ICD10-I70.0). Electronically Signed   By: Anner Crete M.D.   On: 10/19/2016 00:12   Dg Chest 2 View  Result Date: 10/18/2016 CLINICAL DATA:  Gastroesophageal reflux. Belching after eating. Smoker. Prostate cancer. EXAM: CHEST  2 VIEW COMPARISON:  Chest CT dated 05/05/2009. Chest radiographs dated 07/12/2004.  FINDINGS: Normal sized heart. Stable minimal linear scarring at the left lung base. Otherwise, clear lungs. Aortic arch calcifications. Thoracic spine degenerative changes. IMPRESSION: No acute abnormality. Electronically Signed   By: Claudie Revering M.D.   On: 10/18/2016 21:03    Cardiac Studies   2D echo - preliminary shows preserved LV function additonal report to follow  Patient Profile     76 y.o. male admitted with acute blood loss anemia, noted to have NSTEMI, secondary to supply/demand mismatch, now feeling well, s/p transfusion  Assessment & Plan    1. NSTEMI - this appears to be secondary to blood loss. His preliminary echo result shows a good LV and would not pursue additional workup as an inpatient. He denies anginal symptoms.  2. Anemia - s/p transfusion and doing well. 3. HTN - try hydralazine 4. Chronic renal insufficiency - consider renal consult. 5.  Disp. - no additional rec's. Please call for questions.  Signed, Cristopher Peru, MD  10/20/2016, 8:36 AM  Patient ID: Christel Mormon, male   DOB: [birth year], 76 y.o.   MRN: [id number]

## 2016-10-21 ENCOUNTER — Inpatient Hospital Stay (HOSPITAL_COMMUNITY): Payer: Medicare Other

## 2016-10-21 ENCOUNTER — Inpatient Hospital Stay (HOSPITAL_COMMUNITY): Payer: Medicare Other | Admitting: Certified Registered Nurse Anesthetist

## 2016-10-21 ENCOUNTER — Encounter (HOSPITAL_COMMUNITY): Payer: Self-pay | Admitting: *Deleted

## 2016-10-21 ENCOUNTER — Encounter (HOSPITAL_COMMUNITY)
Admission: EM | Disposition: A | Discharge status: Home Health | Payer: Self-pay | Source: Home / Self Care | Attending: Internal Medicine

## 2016-10-21 DIAGNOSIS — D500 Iron deficiency anemia secondary to blood loss (chronic): Secondary | ICD-10-CM

## 2016-10-21 DIAGNOSIS — D124 Benign neoplasm of descending colon: Secondary | ICD-10-CM

## 2016-10-21 DIAGNOSIS — K5520 Angiodysplasia of colon without hemorrhage: Secondary | ICD-10-CM

## 2016-10-21 DIAGNOSIS — I214 Non-ST elevation (NSTEMI) myocardial infarction: Secondary | ICD-10-CM

## 2016-10-21 DIAGNOSIS — D120 Benign neoplasm of cecum: Secondary | ICD-10-CM

## 2016-10-21 DIAGNOSIS — D122 Benign neoplasm of ascending colon: Secondary | ICD-10-CM

## 2016-10-21 HISTORY — PX: COLONOSCOPY WITH PROPOFOL: SHX5780

## 2016-10-21 HISTORY — PX: ESOPHAGOGASTRODUODENOSCOPY: SHX5428

## 2016-10-21 LAB — NM MYOCAR MULTI W/SPECT W/WALL MOTION / EF
CSEPEDS: 0 s
CSEPHR: 57 %
CSEPPHR: 85 {beats}/min
Estimated workload: 1 METS
Exercise duration (min): 0 min
MPHR: 147 {beats}/min
Rest HR: 57 {beats}/min

## 2016-10-21 LAB — COMPREHENSIVE METABOLIC PANEL
ALBUMIN: 3.3 g/dL — AB (ref 3.5–5.0)
ALK PHOS: 117 U/L (ref 38–126)
ALT: 9 U/L — ABNORMAL LOW (ref 17–63)
ANION GAP: 9 (ref 5–15)
AST: 19 U/L (ref 15–41)
BILIRUBIN TOTAL: 0.7 mg/dL (ref 0.3–1.2)
BUN: 31 mg/dL — ABNORMAL HIGH (ref 6–20)
CALCIUM: 7.8 mg/dL — AB (ref 8.9–10.3)
CO2: 16 mmol/L — AB (ref 22–32)
Chloride: 117 mmol/L — ABNORMAL HIGH (ref 101–111)
Creatinine, Ser: 5.09 mg/dL — ABNORMAL HIGH (ref 0.61–1.24)
GFR calc non Af Amer: 10 mL/min — ABNORMAL LOW (ref >60)
GFR, EST AFRICAN AMERICAN: 12 mL/min — AB (ref >60)
GLUCOSE: 93 mg/dL (ref 65–99)
POTASSIUM: 5.1 mmol/L (ref 3.5–5.1)
SODIUM: 142 mmol/L (ref 135–145)
TOTAL PROTEIN: 6.1 g/dL — AB (ref 6.5–8.1)

## 2016-10-21 LAB — CBC
HCT: 23.9 % — ABNORMAL LOW (ref 39.0–52.0)
Hemoglobin: 7.6 g/dL — ABNORMAL LOW (ref 13.0–17.0)
MCH: 28.6 pg (ref 26.0–34.0)
MCHC: 31.8 g/dL (ref 30.0–36.0)
MCV: 89.8 fL (ref 78.0–100.0)
Platelets: 198 10*3/uL (ref 150–400)
RBC: 2.66 MIL/uL — ABNORMAL LOW (ref 4.22–5.81)
RDW: 17.1 % — AB (ref 11.5–15.5)
WBC: 6.5 10*3/uL (ref 4.0–10.5)

## 2016-10-21 LAB — TYPE AND SCREEN
ABO/RH(D): O POS
Antibody Screen: NEGATIVE
UNIT DIVISION: 0
Unit division: 0

## 2016-10-21 LAB — FOLATE RBC
FOLATE, RBC: 1274 ng/mL (ref >498)
Folate, Hemolysate: 299.4 ng/mL
Hematocrit: 23.5 % — ABNORMAL LOW (ref 37.5–51.0)

## 2016-10-21 LAB — PROTEIN ELECTROPHORESIS, SERUM
A/G RATIO SPE: 1.1 (ref 0.7–1.7)
ALBUMIN ELP: 3.5 g/dL (ref 2.9–4.4)
Alpha-1-Globulin: 0.2 g/dL (ref 0.0–0.4)
Alpha-2-Globulin: 0.6 g/dL (ref 0.4–1.0)
BETA GLOBULIN: 1 g/dL (ref 0.7–1.3)
Gamma Globulin: 1.4 g/dL (ref 0.4–1.8)
Globulin, Total: 3.2 g/dL (ref 2.2–3.9)
M-Spike, %: 0.4 g/dL — ABNORMAL HIGH
TOTAL PROTEIN ELP: 6.7 g/dL (ref 6.0–8.5)

## 2016-10-21 LAB — CALCIUM / CREATININE RATIO, URINE
CALCIUM UR: 0.9 mg/dL
CREATININE, UR: 79.7 mg/dL
Calcium/Creat.Ratio: 11 mg/g creat (ref 0–260)

## 2016-10-21 LAB — BPAM RBC
BLOOD PRODUCT EXPIRATION DATE: 201808302359
Blood Product Expiration Date: 201808302359
ISSUE DATE / TIME: 201808032331
ISSUE DATE / TIME: 201808040230
Unit Type and Rh: 5100
Unit Type and Rh: 5100

## 2016-10-21 LAB — HEMOGLOBIN A1C
HEMOGLOBIN A1C: 4.9 % (ref 4.8–5.6)
MEAN PLASMA GLUCOSE: 94 mg/dL

## 2016-10-21 SURGERY — COLONOSCOPY WITH PROPOFOL
Anesthesia: Monitor Anesthesia Care

## 2016-10-21 MED ORDER — PANTOPRAZOLE SODIUM 40 MG PO TBEC
40.0000 mg | DELAYED_RELEASE_TABLET | Freq: Every day | ORAL | Status: DC
  Administered 2016-10-22 – 2016-10-26 (×5): 40 mg via ORAL
  Filled 2016-10-21 (×5): qty 1

## 2016-10-21 MED ORDER — HYDRALAZINE HCL 50 MG PO TABS
50.0000 mg | ORAL_TABLET | Freq: Three times a day (TID) | ORAL | Status: DC
  Administered 2016-10-21 – 2016-10-26 (×13): 50 mg via ORAL
  Filled 2016-10-21 (×12): qty 1

## 2016-10-21 MED ORDER — REGADENOSON 0.4 MG/5ML IV SOLN
INTRAVENOUS | Status: AC
  Filled 2016-10-21: qty 5

## 2016-10-21 MED ORDER — PROPOFOL 500 MG/50ML IV EMUL
INTRAVENOUS | Status: DC | PRN
  Administered 2016-10-21: 10:00:00 via INTRAVENOUS
  Administered 2016-10-21: 100 ug/kg/min via INTRAVENOUS

## 2016-10-21 MED ORDER — REGADENOSON 0.4 MG/5ML IV SOLN
0.4000 mg | Freq: Once | INTRAVENOUS | Status: AC
  Administered 2016-10-21: 0.4 mg via INTRAVENOUS
  Filled 2016-10-21: qty 5

## 2016-10-21 MED ORDER — PHENYLEPHRINE HCL 10 MG/ML IJ SOLN
INTRAVENOUS | Status: DC | PRN
  Administered 2016-10-21: 25 ug/min via INTRAVENOUS

## 2016-10-21 MED ORDER — TECHNETIUM TC 99M TETROFOSMIN IV KIT
10.0000 | PACK | Freq: Once | INTRAVENOUS | Status: AC | PRN
  Administered 2016-10-21: 10 via INTRAVENOUS

## 2016-10-21 MED ORDER — DARBEPOETIN ALFA 150 MCG/0.3ML IJ SOSY
150.0000 ug | PREFILLED_SYRINGE | INTRAMUSCULAR | Status: DC
  Administered 2016-10-21: 150 ug via SUBCUTANEOUS
  Filled 2016-10-21: qty 0.3

## 2016-10-21 MED ORDER — AMINOPHYLLINE 25 MG/ML IV SOLN
INTRAVENOUS | Status: AC
  Filled 2016-10-21: qty 10

## 2016-10-21 MED ORDER — LIDOCAINE 2% (20 MG/ML) 5 ML SYRINGE
INTRAMUSCULAR | Status: DC | PRN
  Administered 2016-10-21: 40 mg via INTRAVENOUS

## 2016-10-21 MED ORDER — SODIUM CHLORIDE 0.9 % IV SOLN
INTRAVENOUS | Status: DC | PRN
  Administered 2016-10-21: 09:00:00 via INTRAVENOUS

## 2016-10-21 MED ORDER — TECHNETIUM TC 99M TETROFOSMIN IV KIT
30.0000 | PACK | Freq: Once | INTRAVENOUS | Status: AC | PRN
  Administered 2016-10-21: 30 via INTRAVENOUS

## 2016-10-21 SURGICAL SUPPLY — 22 items

## 2016-10-21 NOTE — Progress Notes (Signed)
  Patient's stress test showed:    Downsloping ST segment depression ST segment depression of 1 mm was noted during stress in the II, III, aVF, V4, V5 and V6 leads, beginning at 0 minutes of stress, and returning to baseline after 5-9 minutes of recovery.  Defect 1: There is a large defect of severe severity.  Findings consistent with ischemia.  This is a high risk study.  Nuclear stress EF: 40%.   Large size, severe intensity reversible (SDS 7) anterior, apical and inferolateral perfusion defect suggestive of ischemia. LVEF 40% with inferolateral wall hypokinesis. This is a high risk study.  Discussed results with Dr. Sallyanne Kuster. Will need a cardiac catheterization prior to fistula placement. Made Dr. Lorrene Reid and Dr. Scot Dock aware. Per Dr. Scot Dock, will plan for [facility] tomorrow afternoon. Cardiac catheterization has been scheduled for Wednesday (10/23/2016) at 1330. The results and plans were reviewed in detail with the patient and his family.  Signed, Erma Heritage, PA-C 10/21/2016, 5:05 PM Pager: [phone]

## 2016-10-21 NOTE — Progress Notes (Signed)
Progress Note  Patient Name: Roger [name] Date of Encounter: 10/21/2016  Primary Cardiologist: Oval Linsey  Subjective   Just had EGD. No angina. Troponin peaked at 3.4 and falling.  Inpatient Medications    Scheduled Meds: . amLODipine  10 mg Oral Daily  . atorvastatin  80 mg Oral q1800  . carvedilol  3.125 mg Oral BID WC  . febuxostat  80 mg Oral Daily  . hydrALAZINE  25 mg Oral Q8H  . ondansetron (ZOFRAN) IV  4 mg Intravenous Once   Continuous Infusions:  PRN Meds: acetaminophen **OR** acetaminophen, hydrALAZINE   Vital Signs    Vitals:   10/21/16 0350 10/21/16 0850 10/21/16 1017 10/21/16 1027  BP: (!) 146/63 (!) 191/66 (!) 122/46 (!) 166/42  Pulse:  60 (!) 51 (!) 50  Resp: (!) 27 (!) 24 17 (!) 22  Temp: 99.1 F (37.3 C) 97.9 F (36.6 C) 97.6 F (36.4 C)   TempSrc: Oral Oral Oral   SpO2: 100% 100% 98% 97%  Weight:  201 lb (91.2 kg)    Height:  5\' 7"  (1.702 m)      Intake/Output Summary (Last 24 hours) at 10/21/16 1104 Last data filed at 10/21/16 1015  Gross per 24 hour  Intake              515 ml  Output              305 ml  Net              210 ml   Filed Weights   10/19/16 0500 10/20/16 0606 10/21/16 0850  Weight: 205 lb 11 oz (93.3 kg) 201 lb 8 oz (91.4 kg) 201 lb (91.2 kg)    Telemetry    NSR - Personally Reviewed  ECG    NSR, 1st deg AV block, probal;e LVH, inferolateral ST depression - Personally Reviewed  Physical Exam  Sleepy after EGD GEN: No acute distress.  Pale mucous membranes Neck: No JVD Cardiac: RRR, no murmurs, rubs, or gallops.  Respiratory: Clear to auscultation bilaterally. GI: Soft, nontender, non-distended  MS: No edema; No deformity. Neuro:  Nonfocal  Psych: Normal affect   Labs    Chemistry Recent Labs Lab 10/19/16 1058 10/20/16 0700 10/21/16 0607  NA 139 138 142  K 6.0* 5.1 5.1  CL 115* 114* 117*  CO2 18* 15* 16*  GLUCOSE 91 88 93  BUN 45* 38* 31*  CREATININE 5.38* 5.21* 5.09*  CALCIUM 8.4* 8.0*  7.8*  PROT 6.9 6.6 6.1*  ALBUMIN 3.7 3.5 3.3*  AST 21 22 19   ALT 11* 11* 9*  ALKPHOS 122 125 117  BILITOT 0.5 0.8 0.7  GFRNONAA 9* 10* 10*  GFRAA 11* 11* 12*  ANIONGAP 6 9 9      Hematology Recent Labs Lab 10/19/16 1058 10/20/16 0700 10/21/16 0607  WBC 8.3 8.1 6.5  RBC 2.82* 2.88* 2.66*  HGB 8.2* 8.2* 7.6*  HCT 25.2* 25.8* 23.9*  MCV 89.4 89.6 89.8  MCH 29.1 28.5 28.6  MCHC 32.5 31.8 31.8  RDW 17.1* 17.4* 17.1*  PLT 184 210 198    Cardiac Enzymes Recent Labs Lab 10/19/16 0636 10/19/16 1058 10/19/16 1559  TROPONINI 2.68* 3.40* 2.98*    Recent Labs Lab 10/18/16 2114  TROPIPOC 2.31*     BNPNo results for input(s): BNP, PROBNP in the last 168 hours.   DDimer No results for input(s): DDIMER in the last 168 hours.   Radiology    US Renal  Result  Date: 10/20/2016 CLINICAL DATA:  CKD stage 5. Additional history of prostate cancer, renal insufficiency. EXAM: RENAL / URINARY TRACT ULTRASOUND COMPLETE COMPARISON:  None. FINDINGS: Right Kidney: Length: 8 cm. Cortex is diffusely echogenic. Small cyst within the upper pole measures 11 mm. No suspicious mass or hydronephrosis. Left Kidney: Length: 9.8 cm. Cortex is diffusely echogenic. No mass or hydronephrosis. Bladder: Appears normal for degree of bladder distention. Two echogenic masslike areas within the spleen, measuring 4.2 cm and 2.6 cm respectively. IMPRESSION: 1. Both kidneys are echogenic indicating chronic medical renal disease. No suspicious mass or hydronephrosis. 2. Bladder appears normal. 3. Two echogenic masslike areas within the spleen, measuring 4.2 and 2.6 cm respectively. These may represent benign hemangiomas. Other neoplastic process not excluded. If renal function or dialysis allows, consider MRI with contrast for further characterization. Electronically Signed   By: Franki Cabot M.D.   On: 10/20/2016 11:57    Cardiac Studies   Echo 10/20/2016 - Left ventricle: The cavity size was normal. There was mild  focal   basal hypertrophy of the septum. Systolic function was normal.   The estimated ejection fraction was in the range of 55% to 60%.   Wall motion was normal; there were no regional wall motion   abnormalities. Features are consistent with a pseudonormal left   ventricular filling pattern, with concomitant abnormal relaxation   and increased filling pressure (grade 2 diastolic dysfunction).   Doppler parameters are consistent with high ventricular filling   pressure. - Aortic valve: Transvalvular velocity was within the normal range.   There was no stenosis. There was no regurgitation. - Mitral valve: Transvalvular velocity was within the normal range.   There was no evidence for stenosis. There was trivial   regurgitation. - Left atrium: The atrium was mildly dilated. - Right ventricle: The cavity size was normal. Wall thickness was   normal. Systolic function was normal. - Tricuspid valve: There was no regurgitation.  Patient Profile     76 y.o. male with small NSTEMI, due to demand ischemia in setting of severe anemia (acute blood loss from colonic angiodysplasia, iron deficiency, near-ESRD), history of PAD, smoking  Assessment & Plan    1. NSTEMI: due to demand ischemia. High likelihood of underlying CAD, but do not suspect true acute atherothrombotic event. Witholding anticoagulation and antiplatelets due to recent GI bleed. Normal LV wall motion and LVEF by echo.  2. PAD with stable intermittent claudication 3. Smoking, cessation advised. 4. preop CV risk evaluation: moderately increased risk due to presence of PAD and small NSTEMI, but without ongoing angina and with preserved LVEF. Would go ahead with Lexiscan Myoview. If there is a large area of reversible ischemia (especially if anterior), would go ahead with cardiac cath. However, may not yet be appropriate for revascularization. Will ask Dr. Celesta Aver opinion regarding use of heparin and long term use of antiplatelet  agents. In addition, should be noted that performing PCI at this point could delay our ability to place AV fistula. 5. HTN: note markedly decreased diastolic BP. Also has bradycardia. This limits the use of beta blockers.  Signed, Sanda Klein, MD  10/21/2016, 11:04 AM

## 2016-10-21 NOTE — Care Management Note (Addendum)
Case Management Note  Patient Details  Name: Roger [name] MRN: [id number] Date of Birth: 29-Sep-1940  Subjective/Objective:  Pt admitted with blood in stool and epigastric discomfort                  Action/Plan:  PTA from home alone - friends help when needed.  Pt has walker and cane but doesn't use them often.  CM provided health connect number on AVS - per daughter "he cussed out the doctor" - its been over a year since pt has seen primary doctor.  Pt agreed to bedside commode - chose [facility] - agency contacted and referral accepted.   Daughter raised concerns with pts mobility at home - PT/OT ordered.  CM will continue to follow for discharge needs   Expected Discharge Date:  10/24/16               Expected Discharge Plan:     In-House Referral:     Discharge planning Services  CM Consult  Post Acute Care Choice:    Choice offered to:     DME Arranged:    DME Agency:     HH Arranged:    HH Agency:     Status of Service:     If discussed at [employer], dates discussed:    Additional Comments: 10/22/2016 Pt will initiate  long term HD this admit with [facility] being placed today. Heart Cath planned for tomorrow for CAD. Maryclare Labrador, RN 10/21/2016, 4:13 PM

## 2016-10-21 NOTE — Op Note (Signed)
[facility] Patient Name: Roger [name] Procedure Date : 10/21/2016 MRN: [id number] Attending MD: Gatha Mayer , MD Date of Birth: 1940/03/26 CSN: [id number] Age: 76 Admit Type: Inpatient Procedure:                Colonoscopy Indications:              Iron deficiency anemia secondary to chronic blood                            loss Providers:                Gatha Mayer, MD, Cleda Daub, RN, Tinnie Gens, Technician Referring MD:              Medicines:                Propofol per Anesthesia, Monitored Anesthesia Care Complications:            No immediate complications. Estimated Blood Loss:     Estimated blood loss was minimal. Procedure:                Pre-Anesthesia Assessment:                           - Prior to the procedure, a History and Physical                            was performed, and patient medications and                            allergies were reviewed. The patient's tolerance of                            previous anesthesia was also reviewed. The risks                            and benefits of the procedure and the sedation                            options and risks were discussed with the patient.                            All questions were answered, and informed consent                            was obtained. Prior Anticoagulants: The patient has                            taken no previous anticoagulant or antiplatelet                            agents. ASA Grade Assessment: III - A patient with  severe systemic disease. After reviewing the risks                            and benefits, the patient was deemed in                            satisfactory condition to undergo the procedure.                           - Prior to the procedure, a History and Physical                            was performed, and patient medications and                            allergies were reviewed.  The patient's tolerance of                            previous anesthesia was also reviewed. The risks                            and benefits of the procedure and the sedation                            options and risks were discussed with the patient.                            All questions were answered, and informed consent                            was obtained. Prior Anticoagulants: The patient has                            taken no previous anticoagulant or antiplatelet                            agents. ASA Grade Assessment: III - A patient with                            severe systemic disease. After reviewing the risks                            and benefits, the patient was deemed in                            satisfactory condition to undergo the procedure.                           After obtaining informed consent, the colonoscope                            was passed under direct vision. Throughout the  procedure, the patient's blood pressure, pulse, and                            oxygen saturations were monitored continuously. The                            EC-[id number] ([id number]) scope was introduced through                            the anus and advanced to the the cecum, identified                            by appendiceal orifice and ileocecal valve. The                            colonoscopy was performed without difficulty. The                            patient tolerated the procedure well. The quality                            of the bowel preparation was adequate. The                            ileocecal valve, appendiceal orifice, and rectum                            were photographed. The bowel preparation used was                            MoviPrep. Scope In: 9:43:38 AM Scope Out: 10:05:16 AM Scope Withdrawal Time: 0 hours 16 minutes 12 seconds  Total Procedure Duration: 0 hours 21 minutes 38 seconds  Findings:      The perianal  and digital rectal examinations were normal. Pertinent       negatives include normal prostate (size, shape, and consistency).      Two small angiodysplastic lesions without bleeding were found in the       cecum. Coagulation for tissue destruction using argon plasma was       successful. Estimated blood loss was minimal.      Three sessile and semi-pedunculated polyps were found in the descending       colon, ascending colon and cecum. The polyps were 7 to 10 mm in size.       These polyps were removed with a cold snare. Resection and retrieval       were complete. Verification of patient identification for the specimen       was done. Estimated blood loss was minimal.      A few small-mouthed diverticula were found in the sigmoid colon.      The exam was otherwise without abnormality on direct and retroflexion       views. Impression:               - Two non-bleeding colonic angiodysplastic lesions.  Treated with argon plasma coagulation (APC). These                            were likely source of blood loss anemia - and ESRD                            is also causing anemia.                           - Three 7 to 10 mm polyps in the descending colon,                            in the ascending colon and in the cecum, removed                            with a cold snare. Resected and retrieved.                           - Diverticulosis in the sigmoid colon.                           - The examination was otherwise normal on direct                            and retroflexion views. Moderate Sedation:      Please see anesthesia notes, moderate sedation not given Recommendation:           - Patient has a contact number available for                            emergencies. The signs and symptoms of potential                            delayed complications were discussed with the                            patient. Return to normal activities tomorrow.                             Written discharge instructions were provided to the                            patient.                           - Resume previous diet.                           - Continue present medications.                           - Return patient to hospital ward for ongoing care.                           - Resume previous diet.                           -  No routine repeat colonoscopy due to age and                            co-morbidities. Procedure Code(s):        --- Professional ---                           [phone], Colonoscopy, flexible; with ablation of                            tumor(s), polyp(s), or other lesion(s) (includes                            pre- and post-dilation and guide wire passage, when                            performed)                           45385, 59, Colonoscopy, flexible; with removal of                            tumor(s), polyp(s), or other lesion(s) by snare                            technique Diagnosis Code(s):        --- Professional ---                           K55.20, Angiodysplasia of colon without hemorrhage                           D12.4, Benign neoplasm of descending colon                           D12.2, Benign neoplasm of ascending colon                           D12.0, Benign neoplasm of cecum                           D50.0, Iron deficiency anemia secondary to blood                            loss (chronic)                           K57.30, Diverticulosis of large intestine without                            perforation or abscess without bleeding CPT copyright 2016 American Medical Association. All rights reserved. The codes documented in this report are preliminary and upon coder review may  be revised to meet current compliance requirements. Gatha Mayer, MD 10/21/2016 10:24:23 AM This report has been signed electronically. Number of Addenda: 0

## 2016-10-21 NOTE — Progress Notes (Signed)
PROGRESS NOTE    Roger [name]  NGE:[id number] DOB: 11-Dec-1940 DOA: 10/18/2016 PCP: [name], MD     Brief Narrative:   76 y.o. male, w CKD stage4/5 PVD, Pafib ,  Anemia, apparently went out to eat and then c/o nv, no bloody emesis. + diaphoresis,  + burning pain in chest for a while, Pt notes blood in stool (black stool) and epigastric discomfort for 1-2 months.     Assessment & Plan:   1-Acute GI bleed with acute blood loss anemia: Status post transfusion of 2  units  packed RBCs ,  hemoglobin around 8 stable ,EGD normal /Colonoscopy showed angiodysplasia with diverticulosis with no apparent bleeding . no more apparent GI bleed, stop  PPI drip,adavnce the diet . 2-type II non-ST elevation MI in the setting of supply demand mismatch causing the severe anemia in the setting of GI bleed chest pain-free for now, continue beta blocker/add imdur if ok with cards  , hold aspirin or any anticoagulation in the setting of GI bleed, stress test per cards with possible diagnostic cath  . 3-AKI/CKD with chronic related anemia : Related to dehydration and anemia, complicated with hyperkalemia,creat is trending down slowly to his baseline , hyperkalemia resolved , may need HD in the near future , plan for left brachiocephalic cephalic fistula on Wednesday by vascular. 4-Hx of PAD/CKD/Prostate cancer. 5-HTN : Continue coreg/Norvasc , increase Hydralazine dose.    DVT prophylaxis: SCD. Code Status: (Full) Family Communication: (daughter ) Disposition Plan: (home )   Consultants:   GI  Procedures:    Antimicrobials:NONE   Subjective:  States he feels ok , S/P EGD/Colonoscopy , no more apparent GI bleed , no chest pain or SOB.  Objective: Vitals:   10/21/16 0350 10/21/16 0850 10/21/16 1017 10/21/16 1027  BP: (!) 146/63 (!) 191/66 (!) 122/46 (!) 166/42  Pulse:  60 (!) 51 (!) 50  Resp: (!) 27 (!) 24 17 (!) 22  Temp: 99.1 F (37.3 C) 97.9 F (36.6 C) 97.6 F (36.4 C)   TempSrc:  Oral Oral Oral   SpO2: 100% 100% 98% 97%  Weight:  91.2 kg (201 lb)    Height:  5\' 7"  (1.702 m)      Intake/Output Summary (Last 24 hours) at 10/21/16 1206 Last data filed at 10/21/16 1015  Gross per 24 hour  Intake              515 ml  Output              305 ml  Net              210 ml   Filed Weights   10/19/16 0500 10/20/16 0606 10/21/16 0850  Weight: 93.3 kg (205 lb 11 oz) 91.4 kg (201 lb 8 oz) 91.2 kg (201 lb)    Examination:  General exam: NAD. Respiratory system: CTAB . Cardiovascular system: S1 & S2 heard, RRR. No JVD, murmurs, rubs, gallops or clicks. No pedal edema. Gastrointestinal system: Epigastric tenderness no rebound or rigidity . Central nervous system: Alert and oriented. No focal neurological deficits. Extremities: Symmetric 5 x 5 power. Skin: No rashes, lesions or ulcers Psychiatry: Judgement and insight appear normal. Mood & affect appropriate.     Data Reviewed: I have personally reviewed following labs and imaging studies  CBC:  Recent Labs Lab 10/18/16 2104 10/19/16 1058 10/20/16 0700 10/21/16 0607  WBC 7.5 8.3 8.1 6.5  HGB 6.2* 8.2* 8.2* 7.6*  HCT 19.4* 25.2* 25.8* 23.9*  MCV 91.5 89.4 89.6 89.8  PLT 230 184 210 096   Basic Metabolic Panel:  Recent Labs Lab 10/18/16 2104 10/19/16 0636 10/19/16 1058 2016/11/02 0700 10/21/16 0607  NA 139  --  139 138 142  K 5.6*  --  6.0* 5.1 5.1  CL 114*  --  115* 114* 117*  CO2 16*  --  18* 15* 16*  GLUCOSE 122*  --  91 88 93  BUN 49*  --  45* 38* 31*  CREATININE 5.74*  --  5.38* 5.21* 5.09*  CALCIUM 8.5*  --  8.4* 8.0* 7.8*  MG  --  2.0  --   --   --   PHOS  --  4.2  --   --   --    GFR: Estimated Creatinine Clearance: 13.9 mL/min (A) (by C-G formula based on SCr of 5.09 mg/dL (H)). Liver Function Tests:  Recent Labs Lab 10/19/16 1058 11/02/2016 0700 10/21/16 0607  AST 21 22 19   ALT 11* 11* 9*  ALKPHOS 122 125 117  BILITOT 0.5 0.8 0.7  PROT 6.9 6.6 6.1*  ALBUMIN 3.7 3.5 3.3*    No results for input(s): LIPASE, AMYLASE in the last 168 hours. No results for input(s): AMMONIA in the last 168 hours. Coagulation Profile: No results for input(s): INR, PROTIME in the last 168 hours. Cardiac Enzymes:  Recent Labs Lab 10/19/16 0636 10/19/16 1058 10/19/16 1559  TROPONINI 2.68* 3.40* 2.98*   BNP (last 3 results) No results for input(s): PROBNP in the last 8760 hours. HbA1C:  Recent Labs  10/19/16 0637  HGBA1C 4.9   CBG: No results for input(s): GLUCAP in the last 168 hours. Lipid Profile:  Recent Labs  Nov 02, 2016 0228  CHOL 208*  HDL 23*  LDLCALC 140*  TRIG 225*  CHOLHDL 9.0   Thyroid Function Tests:  Recent Labs  10/19/16 0637  TSH 2.539   Anemia Panel:  Recent Labs  10/19/16 0636  VITAMINB12 243  FERRITIN 66  TIBC 291  IRON 73   Urine analysis:    Component Value Date/Time   COLORURINE YELLOW 11/02/16 0255   APPEARANCEUR CLEAR 2016-11-02 0255   LABSPEC 1.010 11/02/2016 0255   LABSPEC 1.020 12/26/2009 1533   PHURINE 6.0 2016-11-02 0255   GLUCOSEU NEGATIVE 11/02/2016 0255   HGBUR NEGATIVE 11-02-16 0255   BILIRUBINUR NEGATIVE 11-02-2016 0255   BILIRUBINUR Negative 12/26/2009 1533   KETONESUR NEGATIVE 02-Nov-2016 0255   PROTEINUR NEGATIVE 11-02-16 0255   NITRITE NEGATIVE Nov 02, 2016 0255   LEUKOCYTESUR NEGATIVE 11-02-16 0255   LEUKOCYTESUR Negative 12/26/2009 1533   Sepsis Labs: @LABRCNTIP (procalcitonin:4,lacticidven:4)  ) Recent Results (from the past 240 hour(s))  MRSA PCR Screening     Status: None   Collection Time: 10/19/16  5:20 AM  Result Value Ref Range Status   MRSA by PCR NEGATIVE NEGATIVE Final    Comment:        The GeneXpert MRSA Assay (FDA approved for NASAL specimens only), is one component of a comprehensive MRSA colonization surveillance program. It is not intended to diagnose MRSA infection nor to guide or monitor treatment for MRSA infections.          Radiology Studies: US  Renal  Result Date: 11-02-2016 CLINICAL DATA:  CKD stage 5. Additional history of prostate cancer, renal insufficiency. EXAM: RENAL / URINARY TRACT ULTRASOUND COMPLETE COMPARISON:  None. FINDINGS: Right Kidney: Length: 8 cm. Cortex is diffusely echogenic. Small cyst within the upper pole measures 11 mm. No suspicious mass or hydronephrosis. Left  Kidney: Length: 9.8 cm. Cortex is diffusely echogenic. No mass or hydronephrosis. Bladder: Appears normal for degree of bladder distention. Two echogenic masslike areas within the spleen, measuring 4.2 cm and 2.6 cm respectively. IMPRESSION: 1. Both kidneys are echogenic indicating chronic medical renal disease. No suspicious mass or hydronephrosis. 2. Bladder appears normal. 3. Two echogenic masslike areas within the spleen, measuring 4.2 and 2.6 cm respectively. These may represent benign hemangiomas. Other neoplastic process not excluded. If renal function or dialysis allows, consider MRI with contrast for further characterization. Electronically Signed   By: Franki Cabot M.D.   On: 10/20/2016 11:57        Scheduled Meds: . amLODipine  10 mg Oral Daily  . atorvastatin  80 mg Oral q1800  . carvedilol  3.125 mg Oral BID WC  . febuxostat  80 mg Oral Daily  . hydrALAZINE  25 mg Oral Q8H  . ondansetron (ZOFRAN) IV  4 mg Intravenous Once  . [START ON 10/22/2016] pantoprazole  40 mg Oral QAC breakfast   Continuous Infusions:    LOS: 3 days    Time spent: [place], MD [facility] Pager [phone]  If 7PM-7AM, please contact night-coverage [email or web address] Password [email or web address] 10/21/2016, 12:06 PM

## 2016-10-21 NOTE — Interval H&P Note (Signed)
History and Physical Interval Note:  10/21/2016 9:18 AM  Roger [name]  has presented today for surgery, with the diagnosis of GI bleed, anemia  The various methods of treatment have been discussed with the patient and family. After consideration of risks, benefits and other options for treatment, the patient has consented to  Procedure(s): COLONOSCOPY WITH PROPOFOL (N/A) ESOPHAGOGASTRODUODENOSCOPY (EGD) (N/A) as a surgical intervention .  The patient's history has been reviewed, patient examined, no change in status, stable for surgery.  I have reviewed the patient's chart and labs.  Questions were answered to the patient's satisfaction.     Silvano Rusk

## 2016-10-21 NOTE — H&P (View-Only) (Signed)
[facility] ROUNDING NOTE   Subjective: C/o abdominal pain and says its hunger pain.    Objective: Vital signs in last 24 hours: Temp:  [97.6 F (36.4 C)-99 F (37.2 C)] 98.6 F (37 C) (08/05 0714) Pulse Rate:  [53-71] 60 (08/05 0714) Resp:  [15-23] 19 (08/05 0714) BP: (148-169)/(45-64) 156/53 (08/05 0714) SpO2:  [98 %-100 %] 98 % (08/05 0330) Weight:  [201 lb 8 oz (91.4 kg)] 201 lb 8 oz (91.4 kg) (08/05 0606) Last BM Date: 10/19/16 General: NAD Abdomen:soft, distended, diffuse generalized tenderness   Intake/Output from previous day: 08/04 0701 - 08/05 0700 In: 326.7 [I.V.:326.7] Out: 850 [Urine:850] Intake/Output this shift: No intake/output data recorded.   Lab Results:  Recent Labs  10/18/16 2104 10/19/16 1058 10/20/16 0700  WBC 7.5 8.3 8.1  HGB 6.2* 8.2* 8.2*  PLT 230 184 210  MCV 91.5 89.4 89.6   BMET  Recent Labs  10/18/16 2104 10/19/16 1058 10/20/16 0700  NA 139 139 138  K 5.6* 6.0* 5.1  CL 114* 115* 114*  CO2 16* 18* 15*  GLUCOSE 122* 91 88  BUN 49* 45* 38*  CREATININE 5.74* 5.38* 5.21*  CALCIUM 8.5* 8.4* 8.0*   LFT  Recent Labs  10/19/16 1058 10/20/16 0700  PROT 6.9 6.6  ALBUMIN 3.7 3.5  AST 21 22  ALT 11* 11*  ALKPHOS 122 125  BILITOT 0.5 0.8   PT/INR No results for input(s): INR in the last 72 hours.    Imaging/Other results: Ct Abdomen Pelvis Wo Contrast  Result Date: 10/19/2016 CLINICAL DATA:  76 year old male with abdominal pain. EXAM: CT ABDOMEN AND PELVIS WITHOUT CONTRAST TECHNIQUE: Multidetector CT imaging of the abdomen and pelvis was performed following the standard protocol without IV contrast. COMPARISON:  Abdominal CT dated 04/26/2009 FINDINGS: Evaluation of this exam is limited in the absence of intravenous contrast. Lower chest: The visualized lung bases are clear. There is coronary vascular calcification. There is hypoattenuation of the cardiac blood pool suggestive of a degree of anemia. Clinical  correlation is recommended. No intra-abdominal free air or free fluid. Hepatobiliary: No focal liver abnormality is seen. No gallstones, gallbladder wall thickening, or biliary dilatation. Pancreas: Unremarkable. No pancreatic ductal dilatation or surrounding inflammatory changes. Spleen: Focal prominence of the superior pole of the spleen (series 2, image 16 and coronal series 5, image 124) is not well characterized and may be related to splenic morphology or represent a splenic lesion. This is new compared to the prior CT of 2011. CT with IV contrast or MRI may provide better characterization if clinically indicated. Adrenals/Urinary Tract: The adrenal glands are unremarkable. There is moderate bilateral renal atrophy, right greater than left. Subcentimeter right renal exophytic hypodense lesions are not characterized but likely represent cysts. There is no hydronephrosis or nephrolithiasis on either side. The visualized ureters appear unremarkable. The urinary bladder is only partially distended and is grossly unremarkable. Stomach/Bowel: There are small scattered colonic as well as terminal ileum diverticula without active inflammatory changes. There is no evidence of bowel obstruction or active inflammation. Normal appendix. Vascular/Lymphatic: There is moderate aortoiliac atherosclerotic disease. Evaluation of the vasculature is limited in the absence of intravenous contrast. No portal venous gas identified. There is no adenopathy. Reproductive: The prostate gland is enlarged measuring approximately 5.8 cm in transverse diameter (previously 7.2 cm). Multiple metallic prostate biopsy clips noted. Other: None Musculoskeletal: There is multilevel degenerative changes of the spine. No acute osseous pathology. IMPRESSION: 1. No acute intra-abdominal or pelvic pathology. No bowel  obstruction or active inflammation. Normal appendix. 2. Small scattered colonic and terminal ileum diverticulum without active  inflammation. 3. Focal lobulated prominence of the superior pole of the spleen, incompletely characterized. CT with IV contrast or MRI may provide better characterization if clinically indicated. 4. Bilateral renal atrophy.  No hydronephrosis or nephrolithiasis. 5.  Aortic Atherosclerosis (ICD10-I70.0). Electronically Signed   By: Anner Crete M.D.   On: 10/19/2016 00:12   Dg Chest 2 View  Result Date: 10/18/2016 CLINICAL DATA:  Gastroesophageal reflux. Belching after eating. Smoker. Prostate cancer. EXAM: CHEST  2 VIEW COMPARISON:  Chest CT dated 05/05/2009. Chest radiographs dated 07/12/2004. FINDINGS: Normal sized heart. Stable minimal linear scarring at the left lung base. Otherwise, clear lungs. Aortic arch calcifications. Thoracic spine degenerative changes. IMPRESSION: No acute abnormality. Electronically Signed   By: Claudie Revering M.D.   On: 10/18/2016 21:03      Assessment &Plan  76 year old male with CAD, acute on chronic kidney injury likely progression of CKD per nephrology admitted with worsening anemia and intermittent melena and rectal bleeding. Troponin elevated to 3.4 yesterday and has trended down since. Per cardiology elevated troponin secondary to demand ischemia. He has normal LV function and no further workup recommended from a cardiac standpoint. Nephrology recommended initiating dialysis but patient is reluctant. Potassium improved to 5.1 this morning. Hemoglobin stable status post transfusion We'll tentatively plan for EGD and colonoscopy tomorrow Clear liquids Start bowel prep Nothing by mouth after midnight Monitor hemoglobin and transfuse as needed Recheck CBC, CMP and PT INR in the morning    K. Denzil Magnuson , MD [phone] Mon-Fri 8a-5p [phone] after 5p, weekends, holidays [facility]

## 2016-10-21 NOTE — Transfer of Care (Signed)
Immediate Anesthesia Transfer of Care Note  Patient: DEXTON ZWILLING  Procedure(s) Performed: Procedure(s): COLONOSCOPY WITH PROPOFOL (N/A) ESOPHAGOGASTRODUODENOSCOPY (EGD) (N/A)  Patient Location: Endoscopy Unit  Anesthesia Type:MAC  Level of Consciousness: awake and alert   Airway & Oxygen Therapy: Patient Spontanous Breathing  Post-op Assessment: Report given to RN and Post -op Vital signs reviewed and stable  Post vital signs: Reviewed and stable  Last Vitals:  Vitals:   10/21/16 0850 10/21/16 1017  BP: (!) 191/66 (!) 122/46  Pulse: 60 (!) 51  Resp: (!) 24 17  Temp: 36.6 C 36.4 C    Last Pain:  Vitals:   10/21/16 1017  TempSrc: Oral  PainSc:          Complications: No apparent anesthesia complications

## 2016-10-21 NOTE — Anesthesia Preprocedure Evaluation (Signed)
Anesthesia Evaluation  Patient identified by MRN, date of birth, ID band Patient awake    Reviewed: Allergy & Precautions, H&P , Patient's Chart, lab work & pertinent test results, reviewed documented beta blocker date and time   Airway Mallampati: II  TM Distance: >3 FB Neck ROM: full    Dental no notable dental hx.    Pulmonary Current Smoker,    Pulmonary exam normal breath sounds clear to auscultation       Cardiovascular  Rhythm:regular Rate:Normal     Neuro/Psych    GI/Hepatic   Endo/Other    Renal/GU CRF and ESRF     Musculoskeletal   Abdominal   Peds  Hematology   Anesthesia Other Findings LV EF: 55% -   60% Demand ischemia on presentation to hosp: transfused and better now ESRD; access tomorrow  anemic  Reproductive/Obstetrics                             Anesthesia Physical Anesthesia Plan  ASA: III  Anesthesia Plan: MAC   Post-op Pain Management:    Induction: Intravenous  PONV Risk Score and Plan:   Airway Management Planned: Mask and Natural Airway  Additional Equipment:   Intra-op Plan:   Post-operative Plan:   Informed Consent: I have reviewed the patients History and Physical, chart, labs and discussed the procedure including the risks, benefits and alternatives for the proposed anesthesia with the patient or authorized representative who has indicated his/her understanding and acceptance.   Dental Advisory Given  Plan Discussed with: CRNA and Surgeon  Anesthesia Plan Comments:         Anesthesia Quick Evaluation

## 2016-10-21 NOTE — Progress Notes (Signed)
   Christel Mormon presented for a [place] cardiolite today.  No immediate complications.  Stress imaging is pending at this time.  Erma Heritage, PA-C 10/21/2016, 1:45 PM

## 2016-10-21 NOTE — Progress Notes (Addendum)
   VASCULAR SURGERY ASSESSMENT & PLAN:   END-STAGE RENAL DISEASE:  Based on his vein map (results below) it looks like his best option for a fistula is a left brachiocephalic cephalic fistula. I will have the IV removed from his left arm. We will also place a tunneled dialysis catheter. He has significant anemia with a hemoglobin of 7.6. He is scheduled for EGD today. We would have to anticoagulate him at the time of surgery so timing of access placement will depend upon his findings on EGD today. Given the hemoglobin of 7.6, he will likely need transfusion preoperatively. In addition, I would like cardiology's clearance prior to surgery. I will schedule the surgery for Wednesday pending the resolution of the issues above.   ANEMIA WITH GI BLEED: For EGD today.  CHEST PAIN WITH POSITIVE TROPONINS: He has had a NSTEMI likely secondary to his blood loss. His echo shows good LV function. I would like cardiology's input also before scheduling his surgery.  HYPERKALEMIA: Potassium yesterday was 5.1.    SUBJECTIVE:   No complaints.  PHYSICAL EXAM:   Vitals:   10/20/16 1522 10/20/16 2000 10/20/16 2318 10/21/16 0350  BP: (!) 141/55 (!) 149/69 (!) 163/78 (!) 146/63  Pulse: (!) 57 (!) 55 (!) 57   Resp: 17 19 (!) 21 (!) 27  Temp: 98.2 F (36.8 C) 98.1 F (36.7 C) 99 F (37.2 C) 99.1 F (37.3 C)  TempSrc: Oral Oral Oral Oral  SpO2: 100% 99% 100% 100%  Weight:      Height:       Palpable left radial pulse.  LABS:   VEIN MAP: I have reviewed his vein map is done yesterday. The left upper arm cephalic vein appears to be reasonable for a potential fistula. The forearm cephalic vein on the left is small. The basilic vein on the left is good sized. The upper arm cephalic vein on the right narrows down to 1.2 mm. The forearm cephalic vein is not adequate. The basilic vein on the right looks reasonable in size.  Lab Results  Component Value Date   WBC 6.5 10/21/2016   HGB 7.6 (L) 10/21/2016   HCT 23.9 (L) 10/21/2016   MCV 89.8 10/21/2016   PLT 198 10/21/2016   Lab Results  Component Value Date   CREATININE 5.21 (H) 10/20/2016    PROBLEM LIST:    Principal Problem:   Elevated troponin Active Problems:   Chest pain   ARF (acute renal failure) (HCC)   Hyperkalemia   Anemia   Demand ischemia (HCC)   Lower GI bleed   Tobacco abuse   CKD (chronic kidney disease) stage 5, GFR less than 15 ml/min (HCC)   Melena   CURRENT MEDS:   . amLODipine  10 mg Oral Daily  . atorvastatin  80 mg Oral q1800  . carvedilol  3.125 mg Oral BID WC  . febuxostat  80 mg Oral Daily  . hydrALAZINE  25 mg Oral Q8H  . ondansetron (ZOFRAN) IV  4 mg Intravenous Once    Gae Gallop Beeper: [phone] Office: [phone] 10/21/2016

## 2016-10-21 NOTE — Plan of Care (Signed)
Problem: Physical Regulation: Goal: Dialysis access will remain free of complications Outcome: Progressing Pt going for vein mapping for access

## 2016-10-21 NOTE — Progress Notes (Signed)
    Asked re: heparin use and long-term platelet use.  Both have relative contraindications but in this setting with his co-morbidities I think appropriate to go ahead with cardiac evaluation as suggested and use anticoagulants and anti-platelets as typical - he is at higher risk of more bleeding problems so if a bare metal stent and shorter-term anti-PLT (clopidoigrel/ticagrelor) Tx I think would be better.  Call if ?  Gatha Mayer, MD, Mar[facility] Regal [phone] cell

## 2016-10-21 NOTE — Progress Notes (Signed)
[facility]  Subjective/interim history:  EGD this AM normal AVF scheduled for Wednesday and Dr. Scot Dock wants transfusion before going to surgery (all this assuming that stress test comes back normal - down for that now)  Vitals:   10/21/16 0850 10/21/16 1017 10/21/16 1027 10/21/16 1152  BP: (!) 191/66 (!) 122/46 (!) 166/42 (!) 163/64  Pulse: 60 (!) 51 (!) 50 (!) 50  Resp: (!) 24 17 (!) 22 20  Temp: 97.9 F (36.6 C) 97.6 F (36.4 C)  97.8 F (36.6 C)  TempSrc: Oral Oral  Oral  SpO2: 100% 98% 97% 98%  Weight: 91.2 kg (201 lb)     Height: _0  (1.702 m)      Exam: Family in room, patient down for stress test  Inpatient medications: . aminophylline      . regadenoson      . amLODipine  10 mg Oral Daily  . atorvastatin  80 mg Oral q1800  . carvedilol  3.125 mg Oral BID WC  . febuxostat  80 mg Oral Daily  . hydrALAZINE  50 mg Oral Q8H  . ondansetron (ZOFRAN) IV  4 mg Intravenous Once  . [START ON 10/22/2016] pantoprazole  40 mg Oral QAC breakfast    acetaminophen **OR** acetaminophen, hydrALAZINE  Renal PTA history Creat               Date                 eGFR 2012                2.7- 3.5 2013                1.95- 2.49 2014                2.42- 3.32        20- 25 10/18/16              5.74 10/19/16              5.38                 10        Recent Labs  10/19/16 0636  10/20/16 0700 10/21/16 0607  NA  --   < > 138 142  K  --   < > 5.1 5.1  CL  --   < > 114* 117*  CO2  --   < > 15* 16*  GLUCOSE  --   < > 88 93  BUN  --   < > 38* 31*  CREATININE  --   < > 5.21* 5.09*  CALCIUM  --   < > 8.0* 7.8*  MG 2.0  --   --   --   PHOS 4.2  --   --   --   < > = values in this interval not displayed.   Recent Labs  10/20/16 0700 10/21/16 0607  AST 22 19  ALT 11* 9*  ALKPHOS 125 117  BILITOT 0.8 0.7  PROT 6.6 6.1*  ALBUMIN 3.5 3.3*   CBC:  Recent Labs  10/20/16 0700 10/21/16 0607  WBC 8.1 6.5  HGB 8.2* 7.6*  HCT 25.8* 23.9*  MCV 89.6 89.8   PLT 210 198    Recent Labs  10/19/16 0636  VITAMINB12 243  FERRITIN 66  TIBC 291  IRON 73     Recent Labs Lab 10/19/16 0636 10/19/16 1058 10/20/16 0700 10/21/16 0607  NA  --  139 138 142  K  --  6.0* 5.1 5.1  CL  --  115* 114* 117*  CO2  --  18* 15* 16*  GLUCOSE  --  91 88 93  BUN  --  45* 38* 31*  CREATININE  --  5.38* 5.21* 5.09*  CALCIUM  --  8.4* 8.0* 7.8*  PHOS 4.2  --   --   --     Recent Labs Lab 10/19/16 1058 10/20/16 0700 10/21/16 0607  AST _0 ALT 11* 11* 9*  ALKPHOS 122 125 117  BILITOT 0.5 0.8 0.7  PROT 6.9 6.6 6.1*  ALBUMIN 3.7 3.5 3.3*    Recent Labs Lab 10/19/16 1058 10/20/16 0700 10/21/16 0607  WBC 8.3 8.1 6.5  HGB 8.2* 8.2* 7.6*  HCT 25.2* 25.8* 23.9*  MCV 89.4 89.6 89.8  PLT 184 210 198   Iron/TIBC/Ferritin/ %Sat    Component Value Date/Time   IRON 73 10/19/2016 0636   TIBC 291 10/19/2016 0636   FERRITIN 66 10/19/2016 0636   IRONPCTSAT 25 10/19/2016 0636     Impression/Recommendations: 1. Renal failure/new ESRD - eGFR in 2014 was 20 ml/min and was f/b [facility] nephrology (Dr. Joelyn Oms) but did not keep appts for a year or two. Presented with creatinine of 5 and uremic sx. Dr. Jonnie Finner discussed options and pt has chosen to proceed with dialysis. Understands need for [facility] and perm access.  CLIP process initiated. HD after placement of access. Dr. Scot Dock has seen and plans access on Wednesday assuming stress test is OK (if not OK and cath needed, would still need to get [facility] placed so that we can get HD initiated).  2. Rectal bleeding/ anemia - Hb 8.2 down to 7.6 today. Dr. Scot Dock wants blood before surgery. Has had 2 U already. EGD neg. Colonoscopy angiodysplasia/tics. No active bleedingnow 3. Anemia - CKD + ABLA. Check Fe stores. Start ESA Aranesp 200/week today 4. CKD-MBD - determine PTH status. Last phos 4.2  5. HTN - hydralazine/coreg/amlodipine 6. AFib  7. Hx DVT 8. CAD hx MI + trops - stress test today 9. PAD hx  claudication 10. Hx prostate cancer rx seed implants 11. Gout  Jamal Maes, MD [facility] [phone] Pager 10/21/2016, 1:07 PM

## 2016-10-21 NOTE — Op Note (Signed)
[facility] Patient Name: Roger [name] Procedure Date : 10/21/2016 MRN: [id number] Attending MD: Gatha Mayer , MD Date of Birth: Mar 12, 1941 CSN: [id number] Age: 76 Admit Type: Inpatient Procedure:                Upper GI endoscopy Indications:              Iron deficiency anemia secondary to chronic blood                            loss Providers:                Gatha Mayer, MD, Cleda Daub, RN, Tinnie Gens, Technician Referring MD:              Medicines:                Propofol per Anesthesia, Monitored Anesthesia Care Complications:            No immediate complications. Estimated Blood Loss:     Estimated blood loss: none. Procedure:                Pre-Anesthesia Assessment:                           - Prior to the procedure, a History and Physical                            was performed, and patient medications and                            allergies were reviewed. The patient's tolerance of                            previous anesthesia was also reviewed. The risks                            and benefits of the procedure and the sedation                            options and risks were discussed with the patient.                            All questions were answered, and informed consent                            was obtained. Prior Anticoagulants: The patient has                            taken no previous anticoagulant or antiplatelet                            agents. ASA Grade Assessment: III - A patient with  severe systemic disease. After reviewing the risks                            and benefits, the patient was deemed in                            satisfactory condition to undergo the procedure.                           After obtaining informed consent, the endoscope was                            passed under direct vision. Throughout the                            procedure, the  patient's blood pressure, pulse, and                            oxygen saturations were monitored continuously. The                            [id number] ([id number]) scope was introduced through the                            mouth, and advanced to the second part of duodenum.                            The upper GI endoscopy was accomplished without                            difficulty. The patient tolerated the procedure                            well. Scope In: Scope Out: Findings:      The esophagus was normal.      The stomach was normal.      The examined duodenum was normal. Impression:               - Normal esophagus.                           - Normal stomach.                           - Normal examined duodenum.                           - No specimens collected. Moderate Sedation:      Please see anesthesia notes, moderate sedation not given Recommendation:           - Continue present medications.                           - Return patient to hospital ward for ongoing care.                           -  See the other procedure note for documentation of                            additional recommendations.                           - Resume previous diet. Procedure Code(s):        --- Professional ---                           [phone], Esophagogastroduodenoscopy, flexible,                            transoral; diagnostic, including collection of                            specimen(s) by brushing or washing, when performed                            (separate procedure) Diagnosis Code(s):        --- Professional ---                           D50.0, Iron deficiency anemia secondary to blood                            loss (chronic) CPT copyright 2016 [employer] Association. All rights reserved. The codes documented in this report are preliminary and upon coder review may  be revised to meet current compliance requirements. Gatha Mayer, MD 10/21/2016 10:19:26 AM This  report has been signed electronically. Number of Addenda: 0

## 2016-10-22 ENCOUNTER — Inpatient Hospital Stay (HOSPITAL_COMMUNITY): Payer: Medicare Other | Admitting: Anesthesiology

## 2016-10-22 ENCOUNTER — Inpatient Hospital Stay (HOSPITAL_COMMUNITY): Payer: Medicare Other

## 2016-10-22 ENCOUNTER — Encounter (HOSPITAL_COMMUNITY): Payer: Self-pay | Admitting: Internal Medicine

## 2016-10-22 ENCOUNTER — Encounter (HOSPITAL_COMMUNITY)
Admission: EM | Disposition: A | Discharge status: Home Health | Payer: Self-pay | Source: Home / Self Care | Attending: Internal Medicine

## 2016-10-22 DIAGNOSIS — N185 Chronic kidney disease, stage 5: Secondary | ICD-10-CM

## 2016-10-22 HISTORY — PX: INSERTION OF DIALYSIS CATHETER: SHX1324

## 2016-10-22 LAB — CBC
HCT: 23.5 % — ABNORMAL LOW (ref 39.0–52.0)
Hemoglobin: 7.6 g/dL — ABNORMAL LOW (ref 13.0–17.0)
MCH: 29 pg (ref 26.0–34.0)
MCHC: 32.3 g/dL (ref 30.0–36.0)
MCV: 89.7 fL (ref 78.0–100.0)
PLATELETS: 195 10*3/uL (ref 150–400)
RBC: 2.62 MIL/uL — AB (ref 4.22–5.81)
RDW: 17.2 % — AB (ref 11.5–15.5)
WBC: 7.7 10*3/uL (ref 4.0–10.5)

## 2016-10-22 LAB — RENAL FUNCTION PANEL
ANION GAP: 8 (ref 5–15)
Albumin: 3.2 g/dL — ABNORMAL LOW (ref 3.5–5.0)
BUN: 29 mg/dL — AB (ref 6–20)
CHLORIDE: 117 mmol/L — AB (ref 101–111)
CO2: 15 mmol/L — ABNORMAL LOW (ref 22–32)
Calcium: 7.9 mg/dL — ABNORMAL LOW (ref 8.9–10.3)
Creatinine, Ser: 4.86 mg/dL — ABNORMAL HIGH (ref 0.61–1.24)
GFR calc Af Amer: 12 mL/min — ABNORMAL LOW (ref >60)
GFR, EST NON AFRICAN AMERICAN: 11 mL/min — AB (ref >60)
Glucose, Bld: 97 mg/dL (ref 65–99)
PHOSPHORUS: 4 mg/dL (ref 2.5–4.6)
POTASSIUM: 4.7 mmol/L (ref 3.5–5.1)
Sodium: 140 mmol/L (ref 135–145)

## 2016-10-22 LAB — BASIC METABOLIC PANEL
ANION GAP: 10 (ref 5–15)
BUN: 28 mg/dL — AB (ref 6–20)
CHLORIDE: 117 mmol/L — AB (ref 101–111)
CO2: 14 mmol/L — ABNORMAL LOW (ref 22–32)
Calcium: 7.9 mg/dL — ABNORMAL LOW (ref 8.9–10.3)
Creatinine, Ser: 4.86 mg/dL — ABNORMAL HIGH (ref 0.61–1.24)
GFR calc Af Amer: 12 mL/min — ABNORMAL LOW (ref >60)
GFR, EST NON AFRICAN AMERICAN: 11 mL/min — AB (ref >60)
Glucose, Bld: 95 mg/dL (ref 65–99)
Potassium: 4.7 mmol/L (ref 3.5–5.1)
SODIUM: 141 mmol/L (ref 135–145)

## 2016-10-22 LAB — IRON AND TIBC
IRON: 24 ug/dL — AB (ref 45–182)
SATURATION RATIOS: 10 % — AB (ref 17.9–39.5)
TIBC: 242 ug/dL — ABNORMAL LOW (ref 250–450)
UIBC: 218 ug/dL

## 2016-10-22 LAB — FERRITIN: Ferritin: 88 ng/mL (ref 24–336)

## 2016-10-22 SURGERY — INSERTION OF DIALYSIS CATHETER
Anesthesia: Monitor Anesthesia Care | Site: Neck | Laterality: Right

## 2016-10-22 MED ORDER — 0.9 % SODIUM CHLORIDE (POUR BTL) OPTIME
TOPICAL | Status: DC | PRN
  Administered 2016-10-22: 1000 mL

## 2016-10-22 MED ORDER — SODIUM CHLORIDE 0.9 % IV SOLN
INTRAVENOUS | Status: DC | PRN
  Administered 2016-10-22: 500 mL

## 2016-10-22 MED ORDER — HEPARIN SODIUM (PORCINE) 1000 UNIT/ML IJ SOLN
INTRAMUSCULAR | Status: AC
  Filled 2016-10-22: qty 1

## 2016-10-22 MED ORDER — SODIUM CHLORIDE 0.9 % IV SOLN
INTRAVENOUS | Status: DC
  Administered 2016-10-22 (×2): via INTRAVENOUS

## 2016-10-22 MED ORDER — MIDAZOLAM HCL 5 MG/5ML IJ SOLN
INTRAMUSCULAR | Status: DC | PRN
  Administered 2016-10-22: 1 mg via INTRAVENOUS

## 2016-10-22 MED ORDER — SODIUM CHLORIDE 0.9 % IV SOLN: Freq: Once | INTRAVENOUS | Status: DC

## 2016-10-22 MED ORDER — PROPOFOL 10 MG/ML IV BOLUS
INTRAVENOUS | Status: AC
  Filled 2016-10-22: qty 20

## 2016-10-22 MED ORDER — CEFAZOLIN SODIUM-DEXTROSE 1-4 GM/50ML-% IV SOLN
INTRAVENOUS | Status: DC | PRN
  Administered 2016-10-22: 2 g via INTRAVENOUS

## 2016-10-22 MED ORDER — LIDOCAINE HCL (PF) 1 % IJ SOLN
INTRAMUSCULAR | Status: DC | PRN
  Administered 2016-10-22: 10 mL

## 2016-10-22 MED ORDER — PROPOFOL 500 MG/50ML IV EMUL
INTRAVENOUS | Status: DC | PRN
  Administered 2016-10-22: 75 ug/kg/min via INTRAVENOUS

## 2016-10-22 MED ORDER — PHENYLEPHRINE HCL 10 MG/ML IJ SOLN
INTRAVENOUS | Status: DC | PRN
  Administered 2016-10-22: 20 ug/min via INTRAVENOUS

## 2016-10-22 MED ORDER — LIDOCAINE HCL (PF) 1 % IJ SOLN
INTRAMUSCULAR | Status: AC
  Filled 2016-10-22: qty 30

## 2016-10-22 MED ORDER — MIDAZOLAM HCL 2 MG/2ML IJ SOLN
INTRAMUSCULAR | Status: AC
  Filled 2016-10-22: qty 2

## 2016-10-22 MED ORDER — FENTANYL CITRATE (PF) 250 MCG/5ML IJ SOLN
INTRAMUSCULAR | Status: AC
  Filled 2016-10-22: qty 5

## 2016-10-22 MED ORDER — LIDOCAINE 2% (20 MG/ML) 5 ML SYRINGE
INTRAMUSCULAR | Status: AC
  Filled 2016-10-22: qty 5

## 2016-10-22 MED ORDER — HEPARIN SODIUM (PORCINE) 1000 UNIT/ML IJ SOLN
INTRAMUSCULAR | Status: DC | PRN
  Administered 2016-10-22: 1000 [IU] via INTRAVENOUS

## 2016-10-22 MED ORDER — PROMETHAZINE HCL 25 MG/ML IJ SOLN: 6.2500 mg | INTRAMUSCULAR | Status: DC | PRN

## 2016-10-22 MED ORDER — FENTANYL CITRATE (PF) 100 MCG/2ML IJ SOLN
INTRAMUSCULAR | Status: DC | PRN
  Administered 2016-10-22: 50 ug via INTRAVENOUS

## 2016-10-22 MED ORDER — ONDANSETRON HCL 4 MG/2ML IJ SOLN
INTRAMUSCULAR | Status: DC | PRN
  Administered 2016-10-22: 4 mg via INTRAVENOUS

## 2016-10-22 MED ORDER — SODIUM CHLORIDE 0.9 % IV SOLN
250.0000 mg | Freq: Every day | INTRAVENOUS | Status: AC
  Administered 2016-10-23 – 2016-10-26 (×4): 250 mg via INTRAVENOUS
  Filled 2016-10-22 (×8): qty 20

## 2016-10-22 MED ORDER — ONDANSETRON HCL 4 MG/2ML IJ SOLN
INTRAMUSCULAR | Status: AC
  Filled 2016-10-22: qty 2

## 2016-10-22 MED ORDER — DEXAMETHASONE SODIUM PHOSPHATE 10 MG/ML IJ SOLN
INTRAMUSCULAR | Status: AC
  Filled 2016-10-22: qty 1

## 2016-10-22 MED ORDER — HYDROMORPHONE HCL 1 MG/ML IJ SOLN: 0.2500 mg | INTRAMUSCULAR | Status: DC | PRN

## 2016-10-22 SURGICAL SUPPLY — 40 items
ADH SKN CLS APL DERMABOND .7 (GAUZE/BANDAGES/DRESSINGS) ×1
BAG DECANTER FOR FLEXI CONT (MISCELLANEOUS) ×2 IMPLANT
BIOPATCH RED 1 DISK 7.0 (GAUZE/BANDAGES/DRESSINGS) ×2 IMPLANT
CATH PALINDROME RT-P 15FX19CM (CATHETERS) IMPLANT
CATH PALINDROME RT-P 15FX23CM (CATHETERS) ×1 IMPLANT
CATH PALINDROME RT-P 15FX28CM (CATHETERS) IMPLANT
CATH PALINDROME RT-P 15FX55CM (CATHETERS) IMPLANT
CATH STRAIGHT 5FR 65CM (CATHETERS) IMPLANT
CHLORAPREP W/TINT 26ML (MISCELLANEOUS) ×2 IMPLANT
COVER PROBE W GEL 5X96 (DRAPES) ×1 IMPLANT
COVER SURGICAL LIGHT HANDLE (MISCELLANEOUS) ×2 IMPLANT
DECANTER SPIKE VIAL GLASS SM (MISCELLANEOUS) IMPLANT
DERMABOND ADVANCED (GAUZE/BANDAGES/DRESSINGS) ×1
DERMABOND ADVANCED .7 DNX12 (GAUZE/BANDAGES/DRESSINGS) IMPLANT
DRAPE C-ARM 42X72 X-RAY (DRAPES) ×2 IMPLANT
DRAPE CHEST BREAST 15X10 FENES (DRAPES) ×2 IMPLANT
GAUZE SPONGE 4X4 16PLY XRAY LF (GAUZE/BANDAGES/DRESSINGS) ×2 IMPLANT
GLOVE BIO SURGEON STRL SZ7.5 (GLOVE) ×2 IMPLANT
GOWN STRL REUS W/ TWL LRG LVL3 (GOWN DISPOSABLE) ×2 IMPLANT
GOWN STRL REUS W/TWL LRG LVL3 (GOWN DISPOSABLE) ×4
KIT BASIN OR (CUSTOM PROCEDURE TRAY) ×2 IMPLANT
KIT ROOM TURNOVER OR (KITS) ×2 IMPLANT
NDL 18GX1X1/2 (RX/OR ONLY) (NEEDLE) ×1 IMPLANT
NDL HYPO 25GX1X1/2 BEV (NEEDLE) ×1 IMPLANT
NEEDLE 18GX1X1/2 (RX/OR ONLY) (NEEDLE) ×2 IMPLANT
NEEDLE HYPO 25GX1X1/2 BEV (NEEDLE) ×2 IMPLANT
NS IRRIG 1000ML POUR BTL (IV SOLUTION) ×2 IMPLANT
PACK SURGICAL SETUP 50X90 (CUSTOM PROCEDURE TRAY) ×2 IMPLANT
PAD ARMBOARD 7.5X6 YLW CONV (MISCELLANEOUS) ×4 IMPLANT
SET MICROPUNCTURE 5F STIFF (MISCELLANEOUS) IMPLANT
SUT ETHILON 3 0 PS 1 (SUTURE) ×2 IMPLANT
SUT VICRYL 4-0 PS2 18IN ABS (SUTURE) ×2 IMPLANT
SYR 10ML LL (SYRINGE) ×2 IMPLANT
SYR 20CC LL (SYRINGE) ×4 IMPLANT
SYR 5ML LL (SYRINGE) ×2 IMPLANT
SYR CONTROL 10ML LL (SYRINGE) ×2 IMPLANT
TOWEL GREEN STERILE (TOWEL DISPOSABLE) ×2 IMPLANT
TOWEL GREEN STERILE FF (TOWEL DISPOSABLE) ×2 IMPLANT
WATER STERILE IRR 1000ML POUR (IV SOLUTION) ×2 IMPLANT
WIRE AMPLATZ SS-J .035X180CM (WIRE) IMPLANT

## 2016-10-22 NOTE — Anesthesia Procedure Notes (Signed)
Procedure Name: MAC Date/Time: 10/22/2016 10:57 AM Performed by: Izora Gala Pre-anesthesia Checklist: Patient identified, Emergency Drugs available, Suction available and Patient being monitored Patient Re-evaluated:Patient Re-evaluated prior to induction Oxygen Delivery Method: Simple face mask Placement Confirmation: positive ETCO2 and breath sounds checked- equal and bilateral

## 2016-10-22 NOTE — Anesthesia Preprocedure Evaluation (Addendum)
Anesthesia Evaluation  Patient identified by MRN, date of birth, ID band Patient awake    Reviewed: Allergy & Precautions, NPO status , Patient's Chart, lab work & pertinent test results  History of Anesthesia Complications Negative for: history of anesthetic complications  Airway Mallampati: II  TM Distance: >3 FB Neck ROM: Full    Dental  (+) Poor Dentition, Dental Advisory Given   Pulmonary Current Smoker,    Pulmonary exam normal        Cardiovascular + Past MI and + Peripheral Vascular Disease  Normal cardiovascular exam+ dysrhythmias Atrial Fibrillation   Study Conclusions  - Left ventricle: The cavity size was normal. There was mild focal   basal hypertrophy of the septum. Systolic function was normal.   The estimated ejection fraction was in the range of 55% to 60%.   Neuro/Psych negative neurological ROS  negative psych ROS   GI/Hepatic Neg liver ROS, GERD  ,  Endo/Other  negative endocrine ROS  Renal/GU Renal InsufficiencyRenal disease     Musculoskeletal   Abdominal   Peds  Hematology   Anesthesia Other Findings   Reproductive/Obstetrics                            Anesthesia Physical Anesthesia Plan  ASA: IV  Anesthesia Plan: MAC   Post-op Pain Management:    Induction:   PONV Risk Score and Plan: 2 and Ondansetron and Dexamethasone  Airway Management Planned: Natural Airway and Simple Face Mask  Additional Equipment:   Intra-op Plan:   Post-operative Plan:   Informed Consent: I have reviewed the patients History and Physical, chart, labs and discussed the procedure including the risks, benefits and alternatives for the proposed anesthesia with the patient or authorized representative who has indicated his/her understanding and acceptance.   Dental advisory given  Plan Discussed with: CRNA, Anesthesiologist and Surgeon  Anesthesia Plan Comments:         Anesthesia Quick Evaluation

## 2016-10-22 NOTE — Progress Notes (Signed)
PROGRESS NOTE    Roger [name]  WPY:[id number] DOB: 11-02-1940 DOA: 10/18/2016 PCP: [name], MD     Brief Narrative:   76 y.o. male, w CKD stage4/5 PVD, Pafib ,  Anemia, apparently went out to eat and then c/o nv, no bloody emesis. + diaphoresis,  + burning pain in chest for a while, Pt notes blood in stool (black stool) and epigastric discomfort for 1-2 months.     Assessment & Plan:   1-Acute GI bleed with acute blood loss anemia: resolved ,Status post transfusion of 2  units  packed RBCs ,  hemoglobin around 8 stable ,EGD normal /Colonoscopy showed angiodysplasia with diverticulosis with no apparent bleeding . no more apparent GI bleed, stopped the  PPI drip,adavnce the diet . 2-Type 2 non-ST elevation MI in the setting of supply demand mismatch ( WITH UNDERLYING SEVER CAD) causing the severe anemia in the setting of GI bleed chest pain-free for now, continue beta blocker/ , hold aspirin or any anticoagulation in the setting of GI bleed, stress test showed large reversible ischemia , cath will be done after HD cath insertion and HD initiation . 3-AKI/CKD with chronic related anemia :Progressive CKD , Related to dehydration and anemia, complicated with hyperkalemia,creat is trending down slowly to his baseline , hyperkalemia resolved ,  HD per Nephrology to be initiated today before The cath Wednesday . 4-Hx of PAD/CKD/Prostate cancer. 5-HTN : Continue coreg/Norvasc , Hydralazine increased , BP will get better after HD . 6-Goals of care : discussed with the daughter and he is full code .   Plan for HD today then cath tomorrow.    DVT prophylaxis: SCD. Code Status: (Full) Family Communication: (daughter ) Disposition Plan: (home )   Consultants:   GI  Procedures:    Antimicrobials:NONE   Subjective:  No chest pain or SOB , no fevers or chills , no nausea or vomiting ,Stress test Showed large area of reversible ischemia .  Objective: Vitals:   10/22/16 0402  10/22/16 0600 10/22/16 0657 10/22/16 0800  BP: (!) 139/56  (!) 170/64 (!) 144/47  Pulse:    60  Resp: (!) 25 (!) 30  18  Temp: 98.5 F (36.9 C)   98.5 F (36.9 C)  TempSrc: Oral   Oral  SpO2: 99% 99%  100%  Weight:  91 kg (200 lb 9.9 oz)    Height:  5\' 7"  (1.702 m)      Intake/Output Summary (Last 24 hours) at 10/22/16 0903 Last data filed at 10/21/16 2301  Gross per 24 hour  Intake          1074.17 ml  Output              505 ml  Net           569.17 ml   Filed Weights   10/20/16 0606 10/21/16 0850 10/22/16 0600  Weight: 91.4 kg (201 lb 8 oz) 91.2 kg (201 lb) 91 kg (200 lb 9.9 oz)    Examination:  General exam: NAD. Respiratory system: CTAB . Cardiovascular system: S1 & S2 heard, RRR. No JVD, murmurs, rubs, gallops or clicks. No pedal edema. Gastrointestinal system: Soft ,NT,ND. Central nervous system: Alert and oriented. No focal neurological deficits. Extremities: Symmetric 5 x 5 power. Skin: No rashes, lesions or ulcers Psychiatry: Judgement and insight appear normal. Mood & affect appropriate.     Data Reviewed: I have personally reviewed following labs and imaging studies  CBC:  Recent Labs Lab 10/18/16  2104 10/19/16 0637 10/19/16 1058 10/20/16 0700 10/21/16 0607 10/22/16 0229  WBC 7.5  --  8.3 8.1 6.5 7.7  HGB 6.2*  --  8.2* 8.2* 7.6* 7.6*  HCT 19.4* 23.5* 25.2* 25.8* 23.9* 23.5*  MCV 91.5  --  89.4 89.6 89.8 89.7  PLT 230  --  184 210 198 094   Basic Metabolic Panel:  Recent Labs Lab 10/18/16 2104 10/19/16 0636 10/19/16 1058 10/20/16 0700 10/21/16 0607 10/22/16 0229  NA 139  --  139 138 142 141  140  K 5.6*  --  6.0* 5.1 5.1 4.7  4.7  CL 114*  --  115* 114* 117* 117*  117*  CO2 16*  --  18* 15* 16* 14*  15*  GLUCOSE 122*  --  91 88 93 95  97  BUN 49*  --  45* 38* 31* 28*  29*  CREATININE 5.74*  --  5.38* 5.21* 5.09* 4.86*  4.86*  CALCIUM 8.5*  --  8.4* 8.0* 7.8* 7.9*  7.9*  MG  --  2.0  --   --   --   --   PHOS  --  4.2  --    --   --  4.0   GFR: Estimated Creatinine Clearance: 14.6 mL/min (A) (by C-G formula based on SCr of 4.86 mg/dL (H)). Liver Function Tests:  Recent Labs Lab 10/19/16 1058 10/20/16 0700 10/21/16 0607 10/22/16 0229  AST 21 22 19   --   ALT 11* 11* 9*  --   ALKPHOS 122 125 117  --   BILITOT 0.5 0.8 0.7  --   PROT 6.9 6.6 6.1*  --   ALBUMIN 3.7 3.5 3.3* 3.2*   No results for input(s): LIPASE, AMYLASE in the last 168 hours. No results for input(s): AMMONIA in the last 168 hours. Coagulation Profile: No results for input(s): INR, PROTIME in the last 168 hours. Cardiac Enzymes:  Recent Labs Lab 10/19/16 0636 10/19/16 1058 10/19/16 1559  TROPONINI 2.68* 3.40* 2.98*   BNP (last 3 results) No results for input(s): PROBNP in the last 8760 hours. HbA1C: No results for input(s): HGBA1C in the last 72 hours. CBG: No results for input(s): GLUCAP in the last 168 hours. Lipid Profile:  Recent Labs  10/20/16 0228  CHOL 208*  HDL 23*  LDLCALC 140*  TRIG 225*  CHOLHDL 9.0   Thyroid Function Tests: No results for input(s): TSH, T4TOTAL, FREET4, T3FREE, THYROIDAB in the last 72 hours. Anemia Panel:  Recent Labs  10/22/16 0229  FERRITIN 88  TIBC 242*  IRON 24*   Urine analysis:    Component Value Date/Time   COLORURINE YELLOW 10/20/2016 0255   APPEARANCEUR CLEAR 10/20/2016 0255   LABSPEC 1.010 10/20/2016 0255   LABSPEC 1.020 12/26/2009 1533   PHURINE 6.0 10/20/2016 0255   GLUCOSEU NEGATIVE 10/20/2016 0255   HGBUR NEGATIVE 10/20/2016 0255   BILIRUBINUR NEGATIVE 10/20/2016 0255   BILIRUBINUR Negative 12/26/2009 1533   KETONESUR NEGATIVE 10/20/2016 0255   PROTEINUR NEGATIVE 10/20/2016 0255   NITRITE NEGATIVE 10/20/2016 0255   LEUKOCYTESUR NEGATIVE 10/20/2016 0255   LEUKOCYTESUR Negative 12/26/2009 1533   Sepsis Labs: @LABRCNTIP (procalcitonin:4,lacticidven:4)  ) Recent Results (from the past 240 hour(s))  MRSA PCR Screening     Status: None   Collection Time:  10/19/16  5:20 AM  Result Value Ref Range Status   MRSA by PCR NEGATIVE NEGATIVE Final    Comment:        The GeneXpert MRSA Assay (FDA approved for  NASAL specimens only), is one component of a comprehensive MRSA colonization surveillance program. It is not intended to diagnose MRSA infection nor to guide or monitor treatment for MRSA infections.          Radiology Studies: US Renal  Result Date: 10/20/2016 CLINICAL DATA:  CKD stage 5. Additional history of prostate cancer, renal insufficiency. EXAM: RENAL / URINARY TRACT ULTRASOUND COMPLETE COMPARISON:  None. FINDINGS: Right Kidney: Length: 8 cm. Cortex is diffusely echogenic. Small cyst within the upper pole measures 11 mm. No suspicious mass or hydronephrosis. Left Kidney: Length: 9.8 cm. Cortex is diffusely echogenic. No mass or hydronephrosis. Bladder: Appears normal for degree of bladder distention. Two echogenic masslike areas within the spleen, measuring 4.2 cm and 2.6 cm respectively. IMPRESSION: 1. Both kidneys are echogenic indicating chronic medical renal disease. No suspicious mass or hydronephrosis. 2. Bladder appears normal. 3. Two echogenic masslike areas within the spleen, measuring 4.2 and 2.6 cm respectively. These may represent benign hemangiomas. Other neoplastic process not excluded. If renal function or dialysis allows, consider MRI with contrast for further characterization. Electronically Signed   By: Franki Cabot M.D.   On: 10/20/2016 11:57   Nm Myocar Multi W/spect W/wall Motion / Ef  Result Date: 10/21/2016  Downsloping ST segment depression ST segment depression of 1 mm was noted during stress in the II, III, aVF, V4, V5 and V6 leads, beginning at 0 minutes of stress, and returning to baseline after 5-9 minutes of recovery.  Defect 1: There is a large defect of severe severity.  Findings consistent with ischemia.  This is a high risk study.  Nuclear stress EF: 40%.  Large size, severe intensity reversible  (SDS 7) anterior, apical and inferolateral perfusion defect suggestive of ischemia. LVEF 40% with inferolateral wall hypokinesis. This is a high risk study.        Scheduled Meds: . amLODipine  10 mg Oral Daily  . atorvastatin  80 mg Oral q1800  . carvedilol  3.125 mg Oral BID WC  . darbepoetin (ARANESP) injection - NON-DIALYSIS  150 mcg Subcutaneous Q Mon-1800  . febuxostat  80 mg Oral Daily  . hydrALAZINE  50 mg Oral Q8H  . ondansetron (ZOFRAN) IV  4 mg Intravenous Once  . pantoprazole  40 mg Oral QAC breakfast   Continuous Infusions: . ferric gluconate (FERRLECIT/NULECIT) IV       LOS: 4 days    Time spent: [place], MD [facility]    If 7PM-7AM, please contact night-coverage [email or web address] Password [email or web address] 10/22/2016, 9:03 AM

## 2016-10-22 NOTE — H&P (View-Only) (Signed)
   VASCULAR SURGERY ASSESSMENT & PLAN:   END-STAGE RENAL DISEASE:  Based on his vein map (results below) it looks like his best option for a fistula is a left brachiocephalic cephalic fistula. I will have the IV removed from his left arm. We will also place a tunneled dialysis catheter. He has significant anemia with a hemoglobin of 7.6. He is scheduled for EGD today. We would have to anticoagulate him at the time of surgery so timing of access placement will depend upon his findings on EGD today. Given the hemoglobin of 7.6, he will likely need transfusion preoperatively. In addition, I would like cardiology's clearance prior to surgery. I will schedule the surgery for Wednesday pending the resolution of the issues above.   ANEMIA WITH GI BLEED: For EGD today.  CHEST PAIN WITH POSITIVE TROPONINS: He has had a NSTEMI likely secondary to his blood loss. His echo shows good LV function. I would like cardiology's input also before scheduling his surgery.  HYPERKALEMIA: Potassium yesterday was 5.1.    SUBJECTIVE:   No complaints.  PHYSICAL EXAM:   Vitals:   10/20/16 1522 10/20/16 2000 10/20/16 2318 10/21/16 0350  BP: (!) 141/55 (!) 149/69 (!) 163/78 (!) 146/63  Pulse: (!) 57 (!) 55 (!) 57   Resp: 17 19 (!) 21 (!) 27  Temp: 98.2 F (36.8 C) 98.1 F (36.7 C) 99 F (37.2 C) 99.1 F (37.3 C)  TempSrc: Oral Oral Oral Oral  SpO2: 100% 99% 100% 100%  Weight:      Height:       Palpable left radial pulse.  LABS:   VEIN MAP: I have reviewed his vein map is done yesterday. The left upper arm cephalic vein appears to be reasonable for a potential fistula. The forearm cephalic vein on the left is small. The basilic vein on the left is good sized. The upper arm cephalic vein on the right narrows down to 1.2 mm. The forearm cephalic vein is not adequate. The basilic vein on the right looks reasonable in size.  Lab Results  Component Value Date   WBC 6.5 10/21/2016   HGB 7.6 (L) 10/21/2016   HCT 23.9 (L) 10/21/2016   MCV 89.8 10/21/2016   PLT 198 10/21/2016   Lab Results  Component Value Date   CREATININE 5.21 (H) 10/20/2016    PROBLEM LIST:    Principal Problem:   Elevated troponin Active Problems:   Chest pain   ARF (acute renal failure) (HCC)   Hyperkalemia   Anemia   Demand ischemia (HCC)   Lower GI bleed   Tobacco abuse   CKD (chronic kidney disease) stage 5, GFR less than 15 ml/min (HCC)   Melena   CURRENT MEDS:   . amLODipine  10 mg Oral Daily  . atorvastatin  80 mg Oral q1800  . carvedilol  3.125 mg Oral BID WC  . febuxostat  80 mg Oral Daily  . hydrALAZINE  25 mg Oral Q8H  . ondansetron (ZOFRAN) IV  4 mg Intravenous Once    Gae Gallop Beeper: [phone] Office: [phone] 10/21/2016

## 2016-10-22 NOTE — Op Note (Signed)
Procedure: Ultrasound-guided insertion of Palindrome catheter  Preoperative diagnosis: End-stage renal disease  Postoperative diagnosis: Same  Anesthesia: Local with IV sedation  Operative findings: 23 cm Diatek catheter right internal jugular vein  Operative details: After obtaining informed consent, the patient was taken to the operating room. The patient was placed in supine position on the operating room table. After adequate sedation the patient's entire neck and chest were prepped and draped in usual sterile fashion. The patient was placed in Trendelenburg position. Ultrasound was used to identify the patient's right internal jugular vein. This had normal compressibility and respiratory variation. Local anesthesia was infiltrated over the right jugular vein.  Using ultrasound guidance, the right internal jugular vein was successfully cannulated.  A 0.035 J-tipped guidewire was threaded into the right internal jugular vein and into the superior vena cava followed by the inferior vena cava under fluoroscopic guidance.   Next sequential 12 and 14 dilators were placed over the guidewire into the right atrium.  A 16 French dilator with a peel-away sheath was then placed over the guidewire into the right atrium.   The guidewire and dilator were removed. A 23 cm Palindrome catheter was then placed through the peel away sheath into the right atrium.  The catheter was then tunneled subcutaneously, cut to length, and the hub attached. The catheter was noted to flush and draw easily. The catheter was inspected under fluoroscopy and found with its tip to be in the right atrium without any kinks throughout its course. The catheter was sutured to the skin with nylon sutures. The neck insertion site was closed with Vicryl stitch. The catheter was then loaded with concentrated Heparin solution. A dry sterile dressing was applied.  The patient tolerated procedure well and there were no complications. Instrument  sponge and needle counts correct in the case. The patient was taken to the recovery room in stable condition. Chest x-ray will be obtained in the recovery room.  Ruta Hinds, MD [facility] [place] Office: [phone] Pager: [phone]

## 2016-10-22 NOTE — Progress Notes (Signed)
He is in surgery for tunneled HD catheter right now.  Had a very lengthy discussion with Mr. Nin, his daughter and his brother yesterday regarding the implications of his high risk nuclear study yesterday. He appears to have a severe proximal LAD stenosis, and I strongly suspect this is just the worst of multivessel CAD.  We have reviewed the need for coronary angiography and the high likelihood of further deterioration in renal function, even possible anuria after cath. That is why the HD catheter should be placed before the cardiac cath.  He may have a surgical indication (for CABG). This would be a high risk procedure, but might be his best or only option. It would come with a very high risk of serious GI bleeding and anuric renal failure.  If PCI is an option (say he has isolated and discrete LAD disease), then we will have to deal with the risk of GI bleeding while on DAPT. Depending on the anatomy, BMS might be an option (if the lesion is short and the vessel is large in caliber). BMS would have the advantage of shorter DAPT duration, but would come with higher risk of restenosis.  The family understands this considerations (I think in a little more depth than the patient understands himself) and asked several questions, which I answered the best I could with data available at this time.  The diagnostic cath and possible PCI-stent procedure has been fully reviewed with the patient and written informed consent has been obtained.  Sanda Klein, MD, [facility] [phone] office [phone] pager 10/22/2016 12:06 PM

## 2016-10-22 NOTE — Progress Notes (Signed)
[facility]  Subjective/interim history:  EGD 8/6 normal Stress test with large size, severe intensity reversible (SDS 7) anterior, apical and inferolateral perfusion defect suggestive of ischemia. LVEF 40% with inferolateral wall hypokinesis So "Plan B" - for TDC and HD today (with transfusion), cardiac cath on Wednesday Pt resting in bed, no complaints  Vitals:   10/22/16 0300 10/22/16 0402 10/22/16 0600 10/22/16 0657  BP:  (!) 139/56  (!) 170/64  Pulse:      Resp: 20 (!) 25 (!) 30   Temp:  98.5 F (36.9 C)    TempSrc:  Oral    SpO2: 98% 99% 99%   Weight:   91 kg (200 lb 9.9 oz)   Height:   5' 7" (1.702 m)    Exam: Older AAM NAD Poor dentition No JVD Lungs clear S1S2 No S3 Abd obese, soft, not tender No LE edema, skin dry and ashy  Inpatient medications: . amLODipine  10 mg Oral Daily  . atorvastatin  80 mg Oral q1800  . carvedilol  3.125 mg Oral BID WC  . darbepoetin (ARANESP) injection - NON-DIALYSIS  150 mcg Subcutaneous Q Mon-1800  . febuxostat  80 mg Oral Daily  . hydrALAZINE  50 mg Oral Q8H  . ondansetron (ZOFRAN) IV  4 mg Intravenous Once  . pantoprazole  40 mg Oral QAC breakfast    acetaminophen **OR** acetaminophen, hydrALAZINE  (Renal PTA history) Creat               Date                 eGFR 2012                2.7- 3.5 2013                1.95- 2.49 2014                2.42- 3.32        20- 25 10/18/16              5.74 10/19/16              5.38                 10       Recent Labs  10/21/16 0607 10/22/16 0229  WBC 6.5 7.7  HGB 7.6* 7.6*  HCT 23.9* 23.5*  MCV 89.8 89.7  PLT 198 195    Recent Labs  10/22/16 0229  FERRITIN 88  TIBC 242*  IRON 24*     Recent Labs Lab 10/19/16 0636  10/20/16 0700 10/21/16 0607 10/22/16 0229  NA  --   < > 138 142 141  140  K  --   < > 5.1 5.1 4.7  4.7  CL  --   < > 114* 117* 117*  117*  CO2  --   < > 15* 16* 14*  15*  GLUCOSE  --   < > 88 93 95  97  BUN  --   < > 38* 31* 28*   29*  CREATININE  --   < > 5.21* 5.09* 4.86*  4.86*  CALCIUM  --   < > 8.0* 7.8* 7.9*  7.9*  PHOS 4.2  --   --   --  4.0  < > = values in this interval not displayed.  Recent Labs Lab 10/19/16 1058 10/20/16 0700 10/21/16 0607 10/22/16 0229  AST 21 22 19  --     ALT 11* 11* 9*  --   ALKPHOS 122 125 117  --   BILITOT 0.5 0.8 0.7  --   PROT 6.9 6.6 6.1*  --   ALBUMIN 3.7 3.5 3.3* 3.2*       Component Value Date/Time   IRON 24 (L) 10/22/2016 0229   TIBC 242 (L) 10/22/2016 0229   FERRITIN 88 10/22/2016 0229   IRONPCTSAT 10 (L) 10/22/2016 0229     Impression/Recommendations: 1. Renal failure/new ESRD - eGFR in 2014 was 20 ml/min and was f/b [facility] nephrology (Dr. Joelyn Oms) but did not keep appts for a year or two. Presented with creatinine of 5 and uremic sx. Dr. Jonnie Finner discussed options and pt has chosen to proceed with dialysis. Understands need for [facility] and perm access.  CLIP process initiated. HD #1 after placement of TDC today. AVF postponed until cath/cardiac situation clarified. 2. CAD - Nuclear stress test large size, severe intensity reversible anterior, apical and inferolateral perfusion defect suggestive of ischemia. LVEF 40% with inferolateral wall hypokinesis. Heart cath tomorrow. 3. Rectal bleeding/ anemia - Hb 8.2 down to 7.6. Has had 2 U this adm. EGD neg. Colonoscopy angiodysplasia/tics. No active bleeding now 4. Anemia - CKD + ABLA. Started ESA Aranesp 200/week 8/6. Replete Fe 250 IV QD X 4 for low Tsat of 10%. 5. CKD-MBD - determine PTH status. Last phos 4.2  6. HTN - hydralazine/coreg/amlodipine 7. AFib  8. Hx DVT 9. CAD hx MI + trops - stress test today 10. PAD hx claudication 11. Hx prostate cancer rx seed implants 12. Gout  Jamal Maes, MD [place] Pager 10/22/2016, 7:59 AM

## 2016-10-22 NOTE — Transfer of Care (Signed)
Immediate Anesthesia Transfer of Care Note  Patient: Roger [name]  Procedure(s) Performed: Procedure(s): INSERTION OF TUNNELED DIALYSIS CATHETER (Right)  Patient Location: PACU  Anesthesia Type:MAC  Level of Consciousness: awake, oriented, drowsy and patient cooperative  Airway & Oxygen Therapy: Patient Spontanous Breathing and Patient connected to nasal cannula oxygen  Post-op Assessment: Report given to RN, Post -op Vital signs reviewed and stable and Patient moving all extremities  Post vital signs: Reviewed and stable  Last Vitals:  Vitals:   10/22/16 0657 10/22/16 0800  BP: (!) 170/64 (!) 144/47  Pulse:  60  Resp:  18  Temp:  36.9 C    Last Pain:  Vitals:   10/22/16 0800  TempSrc: Oral  PainSc:          Complications: No apparent anesthesia complications

## 2016-10-22 NOTE — Interval H&P Note (Signed)
History and Physical Interval Note:  10/22/2016 10:22 AM  Roger [name]  has presented today for surgery, with the diagnosis of End Stage Renal Disease  N18.6  The various methods of treatment have been discussed with the patient and family. After consideration of risks, benefits and other options for treatment, the patient has consented to  Procedure(s): INSERTION OF TUNNELED DIALYSIS CATHETER (N/A) as a surgical intervention .  The patient's history has been reviewed, patient examined, no change in status, stable for surgery.  I have reviewed the patient's chart and labs.  Questions were answered to the patient's satisfaction.     Ruta Hinds

## 2016-10-22 NOTE — Anesthesia Postprocedure Evaluation (Signed)
Anesthesia Post Note  Patient: Roger [name]  Procedure(s) Performed: Procedure(s) (LRB): INSERTION OF TUNNELED DIALYSIS CATHETER (Right)     Patient location during evaluation: PACU Anesthesia Type: MAC Level of consciousness: awake and alert Pain management: pain level controlled Vital Signs Assessment: post-procedure vital signs reviewed and stable Respiratory status: spontaneous breathing and respiratory function stable Cardiovascular status: stable Anesthetic complications: no    Last Vitals:  Vitals:   10/22/16 1210 10/22/16 1215  BP: (!) 139/50   Pulse: (!) 52 (!) 55  Resp: 18 18  Temp:      Last Pain:  Vitals:   10/22/16 1215  TempSrc:   PainSc: 0-No pain                 Janielle [place]el[place] DANIEL

## 2016-10-22 NOTE — Evaluation (Signed)
Physical Therapy Evaluation Patient Details Name: Roger [name] MRN: [id number] DOB: 1940/07/20 Today's Date: 10/22/2016   History of Present Illness  pt is a 77 y/o male with pmh significant for CKD stage 4/5, PVD, p. afib, anemia, HTN, admitted with c/o N/V with bloody emesis, diaphoresis, burning dhest pain.  Work up found acute GI bleed, NSTEMI, AKI.  pt s/p Endo, Colonoscopy and HD catheter in R internal jugular.  Clinical Impression  Pt admitted with/for complications above.  Pt notably fatigued on evaluation with decreased activity tolerance, needing min guard to supervision assist overall.  Pt currently limited functionally due to the problems listed below.  (see problems list.)  Pt will benefit from PT to maximize function and safety to be able to get home safely with available assist.     Follow Up Recommendations Home health PT;Supervision for mobility/OOB    Equipment Recommendations  None recommended by PT    Recommendations for Other Services       Precautions / Restrictions Precautions Precautions: Fall      Mobility  Bed Mobility Overal bed mobility: Needs Assistance Bed Mobility: Supine to Sit;Sit to Supine     Supine to sit: Supervision Sit to supine: Supervision   General bed mobility comments: slow, but no assist or rail needed.  Transfers Overall transfer level: Needs assistance   Transfers: Sit to/from Stand;Stand Pivot Transfers Sit to Stand: Supervision Stand pivot transfers: Min guard          Ambulation/Gait Ambulation/Gait assistance: Min guard Ambulation Distance (Feet): 90 Feet Assistive device: None Gait Pattern/deviations: Step-through pattern   Gait velocity interpretation: Below normal speed for age/gender General Gait Details: pt drifted, wandered to right and scissored mildly to stay balanced.  Pt mildly fatigued and feeling dizzy-headed.  Stairs            Wheelchair Mobility    Modified Rankin (Stroke Patients  Only)       Balance Overall balance assessment: Needs assistance Sitting-balance support: No upper extremity supported Sitting balance-Leahy Scale: Fair     Standing balance support: No upper extremity supported Standing balance-Leahy Scale: Fair                               Pertinent Vitals/Pain Pain Assessment: Faces Faces Pain Scale: Hurts even more Pain Location: R neck, are of SCM Pain Descriptors / Indicators: Sore;Grimacing Pain Intervention(s): Monitored during session    Home Living Family/patient expects to be discharged to:: Private residence Living Arrangements: Alone Available Help at Discharge: Other (Comment) (has a "daughter" (not biological) that helps him.) Type of Home: Apartment Home Access: Level entry     Home Layout: One level Home Equipment: Walker - 2 wheels;Cane - single point      Prior Function Level of Independence: Independent;Independent with assistive device(s)         Comments: has not used AD's often, "once a month"     Hand Dominance        Extremity/Trunk Assessment   Upper Extremity Assessment Upper Extremity Assessment: Overall WFL for tasks assessed    Lower Extremity Assessment Lower Extremity Assessment: Overall WFL for tasks assessed (mild proximal weakness)       Communication   Communication: No difficulties  Cognition Arousal/Alertness: Awake/alert Behavior During Therapy: WFL for tasks assessed/performed Overall Cognitive Status: Within Functional Limits for tasks assessed  General Comments General comments (skin integrity, edema, etc.): sats remained upper 90's throughout on RA.    Exercises     Assessment/Plan    PT Assessment Patient needs continued PT services  PT Problem List Decreased strength;Decreased activity tolerance;Decreased balance;Decreased mobility;Pain       PT Treatment Interventions DME instruction;Gait  training;Functional mobility training;Therapeutic activities;Balance training;Patient/family education    PT Goals (Current goals can be found in the Care Plan section)  Acute Rehab PT Goals Patient Stated Goal: Get home and be able to do for myself PT Goal Formulation: With patient Time For Goal Achievement: 11/05/16 Potential to Achieve Goals: Good    Frequency Min 3X/week   Barriers to discharge        Co-evaluation               AM-PAC PT "6 Clicks" Daily Activity  Outcome Measure Difficulty turning over in bed (including adjusting bedclothes, sheets and blankets)?: A Little Difficulty moving from lying on back to sitting on the side of the bed? : A Little Difficulty sitting down on and standing up from a chair with arms (e.g., wheelchair, bedside commode, etc,.)?: None Help needed moving to and from a bed to chair (including a wheelchair)?: A Little Help needed walking in hospital room?: A Little Help needed climbing 3-5 steps with a railing? : A Little 6 Click Score: 19    End of Session   Activity Tolerance: Patient tolerated treatment well;Patient limited by fatigue Patient left: in bed;with call bell/phone within reach;with bed alarm set;with family/visitor present Nurse Communication: Mobility status      Time: 3419-3790 PT Time Calculation (min) (ACUTE ONLY): 29 min   Charges:   PT Evaluation $PT Eval Moderate Complexity: 1 Mod PT Treatments $Gait Training: 8-22 mins   PT G Codes:        10/30/16  Donnella Sham, PT [phone]  (pager)  Tessie Fass Katheen Aslin 2016-10-30, 4:44 PM

## 2016-10-23 ENCOUNTER — Encounter (HOSPITAL_COMMUNITY)
Admission: EM | Disposition: A | Discharge status: Home Health | Payer: Self-pay | Source: Home / Self Care | Attending: Internal Medicine

## 2016-10-23 ENCOUNTER — Encounter (HOSPITAL_COMMUNITY): Payer: Self-pay | Admitting: Vascular Surgery

## 2016-10-23 DIAGNOSIS — I2510 Atherosclerotic heart disease of native coronary artery without angina pectoris: Secondary | ICD-10-CM

## 2016-10-23 HISTORY — PX: LEFT HEART CATH AND CORONARY ANGIOGRAPHY: CATH118249

## 2016-10-23 LAB — GLUCOSE, CAPILLARY: Glucose-Capillary: 82 mg/dL (ref 65–99)

## 2016-10-23 LAB — CBC
HCT: 26.6 % — ABNORMAL LOW (ref 39.0–52.0)
Hemoglobin: 8.6 g/dL — ABNORMAL LOW (ref 13.0–17.0)
MCH: 28.8 pg (ref 26.0–34.0)
MCHC: 32.3 g/dL (ref 30.0–36.0)
MCV: 89 fL (ref 78.0–100.0)
PLATELETS: 165 10*3/uL (ref 150–400)
RBC: 2.99 MIL/uL — AB (ref 4.22–5.81)
RDW: 16.4 % — AB (ref 11.5–15.5)
WBC: 7.2 10*3/uL (ref 4.0–10.5)

## 2016-10-23 LAB — RENAL FUNCTION PANEL
ANION GAP: 10 (ref 5–15)
Albumin: 3 g/dL — ABNORMAL LOW (ref 3.5–5.0)
BUN: 17 mg/dL (ref 6–20)
CALCIUM: 7.7 mg/dL — AB (ref 8.9–10.3)
CHLORIDE: 111 mmol/L (ref 101–111)
CO2: 19 mmol/L — AB (ref 22–32)
Creatinine, Ser: 3.66 mg/dL — ABNORMAL HIGH (ref 0.61–1.24)
GFR calc non Af Amer: 15 mL/min — ABNORMAL LOW (ref >60)
GFR, EST AFRICAN AMERICAN: 18 mL/min — AB (ref >60)
GLUCOSE: 84 mg/dL (ref 65–99)
POTASSIUM: 4.1 mmol/L (ref 3.5–5.1)
Phosphorus: 3 mg/dL (ref 2.5–4.6)
SODIUM: 140 mmol/L (ref 135–145)

## 2016-10-23 LAB — HEPATITIS B SURFACE ANTIGEN: Hepatitis B Surface Ag: NEGATIVE

## 2016-10-23 LAB — ABO/RH: ABO/RH(D): O POS

## 2016-10-23 LAB — PROTIME-INR
INR: 1.03
Prothrombin Time: 13.5 seconds (ref 11.4–15.2)

## 2016-10-23 LAB — PARATHYROID HORMONE, INTACT (NO CA): PTH: 212 pg/mL — AB (ref 15–65)

## 2016-10-23 LAB — PREPARE RBC (CROSSMATCH)

## 2016-10-23 SURGERY — LEFT HEART CATH AND CORONARY ANGIOGRAPHY
Anesthesia: LOCAL

## 2016-10-23 MED ORDER — LIDOCAINE HCL (PF) 1 % IJ SOLN
INTRAMUSCULAR | Status: AC
  Filled 2016-10-23: qty 30

## 2016-10-23 MED ORDER — FENTANYL CITRATE (PF) 100 MCG/2ML IJ SOLN
INTRAMUSCULAR | Status: AC
  Filled 2016-10-23: qty 2

## 2016-10-23 MED ORDER — LIDOCAINE HCL (PF) 1 % IJ SOLN
INTRAMUSCULAR | Status: DC | PRN
  Administered 2016-10-23: 18 mL via INTRADERMAL

## 2016-10-23 MED ORDER — SODIUM CHLORIDE 0.9 % WEIGHT BASED INFUSION: 1.0000 mL/kg/h | INTRAVENOUS | Status: DC

## 2016-10-23 MED ORDER — SODIUM CHLORIDE 0.9% FLUSH
3.0000 mL | Freq: Two times a day (BID) | INTRAVENOUS | Status: DC
  Administered 2016-10-23 – 2016-10-25 (×4): 3 mL via INTRAVENOUS

## 2016-10-23 MED ORDER — ASPIRIN 81 MG PO CHEW
81.0000 mg | CHEWABLE_TABLET | ORAL | Status: AC
  Administered 2016-10-23: 81 mg via ORAL
  Filled 2016-10-23: qty 1

## 2016-10-23 MED ORDER — SODIUM CHLORIDE 0.9% FLUSH
3.0000 mL | INTRAVENOUS | Status: DC | PRN
  Administered 2016-10-24: 3 mL via INTRAVENOUS
  Filled 2016-10-23: qty 3

## 2016-10-23 MED ORDER — IOPAMIDOL (ISOVUE-370) INJECTION 76%
INTRAVENOUS | Status: DC | PRN
  Administered 2016-10-23: 95 mL via INTRA_ARTERIAL

## 2016-10-23 MED ORDER — SODIUM CHLORIDE 0.9 % IV SOLN: INTRAVENOUS | Status: AC

## 2016-10-23 MED ORDER — HEPARIN (PORCINE) IN NACL 2-0.9 UNIT/ML-% IJ SOLN
INTRAMUSCULAR | Status: AC
  Filled 2016-10-23: qty 500

## 2016-10-23 MED ORDER — HYDRALAZINE HCL 20 MG/ML IJ SOLN
INTRAMUSCULAR | Status: AC
  Filled 2016-10-23: qty 1

## 2016-10-23 MED ORDER — HEPARIN (PORCINE) IN NACL 2-0.9 UNIT/ML-% IJ SOLN
INTRAMUSCULAR | Status: AC
  Filled 2016-10-23: qty 1000

## 2016-10-23 MED ORDER — MIDAZOLAM HCL 2 MG/2ML IJ SOLN
INTRAMUSCULAR | Status: DC | PRN
  Administered 2016-10-23: 1 mg via INTRAVENOUS

## 2016-10-23 MED ORDER — SODIUM CHLORIDE 0.9 % IV SOLN: 250.0000 mL | INTRAVENOUS | Status: DC | PRN

## 2016-10-23 MED ORDER — SODIUM CHLORIDE 0.9% FLUSH: 3.0000 mL | INTRAVENOUS | Status: DC | PRN

## 2016-10-23 MED ORDER — ASPIRIN 81 MG PO CHEW: 81.0000 mg | CHEWABLE_TABLET | ORAL | Status: DC

## 2016-10-23 MED ORDER — SODIUM CHLORIDE 0.9% FLUSH
3.0000 mL | Freq: Two times a day (BID) | INTRAVENOUS | Status: DC
  Administered 2016-10-23 (×2): 3 mL via INTRAVENOUS

## 2016-10-23 MED ORDER — HYDRALAZINE HCL 20 MG/ML IJ SOLN
INTRAMUSCULAR | Status: DC | PRN
  Administered 2016-10-23: 10 mg via INTRAVENOUS

## 2016-10-23 MED ORDER — SODIUM CHLORIDE 0.9 % IV SOLN
250.0000 mL | INTRAVENOUS | Status: DC | PRN
Start: 2016-10-23 — End: 2016-10-23

## 2016-10-23 MED ORDER — FENTANYL CITRATE (PF) 100 MCG/2ML IJ SOLN
INTRAMUSCULAR | Status: DC | PRN
  Administered 2016-10-23: 25 ug via INTRAVENOUS

## 2016-10-23 MED ORDER — SODIUM CHLORIDE 0.9 % IV SOLN
INTRAVENOUS | Status: DC
  Administered 2016-10-23: 06:00:00 via INTRAVENOUS

## 2016-10-23 MED ORDER — SODIUM CHLORIDE 0.9 % WEIGHT BASED INFUSION: 3.0000 mL/kg/h | INTRAVENOUS | Status: DC

## 2016-10-23 MED ORDER — HEPARIN (PORCINE) IN NACL 2-0.9 UNIT/ML-% IJ SOLN
INTRAMUSCULAR | Status: AC | PRN
  Administered 2016-10-23: 1000 mL via INTRA_ARTERIAL

## 2016-10-23 MED ORDER — SODIUM CHLORIDE 0.9% FLUSH
3.0000 mL | INTRAVENOUS | Status: DC | PRN
Start: 2016-10-23 — End: 2016-10-23

## 2016-10-23 MED ORDER — SODIUM CHLORIDE 0.9% FLUSH
3.0000 mL | Freq: Two times a day (BID) | INTRAVENOUS | Status: DC
Start: 2016-10-23 — End: 2016-10-23
  Administered 2016-10-23: 3 mL via INTRAVENOUS

## 2016-10-23 MED ORDER — HEPARIN SODIUM (PORCINE) 1000 UNIT/ML IJ SOLN
INTRAMUSCULAR | Status: AC
  Filled 2016-10-23: qty 1

## 2016-10-23 MED ORDER — RENA-VITE PO TABS
1.0000 | ORAL_TABLET | Freq: Every day | ORAL | Status: DC
  Administered 2016-10-24 – 2016-10-25 (×3): 1 via ORAL
  Filled 2016-10-23 (×3): qty 1

## 2016-10-23 MED ORDER — SODIUM CHLORIDE 0.9 % IV SOLN
250.0000 mL | INTRAVENOUS | Status: DC | PRN
Start: 2016-10-23 — End: 2016-10-26

## 2016-10-23 MED ORDER — IOPAMIDOL (ISOVUE-370) INJECTION 76%
INTRAVENOUS | Status: AC
  Filled 2016-10-23: qty 50

## 2016-10-23 MED ORDER — MIDAZOLAM HCL 2 MG/2ML IJ SOLN
INTRAMUSCULAR | Status: AC
  Filled 2016-10-23: qty 2

## 2016-10-23 SURGICAL SUPPLY — 7 items
CATH INFINITI 5FR MULTPACK ANG (CATHETERS) ×1 IMPLANT
KIT HEART LEFT (KITS) ×2 IMPLANT
PACK CARDIAC CATHETERIZATION (CUSTOM PROCEDURE TRAY) ×2 IMPLANT
SHEATH PINNACLE 5F 10CM (SHEATH) ×1 IMPLANT
TRANSDUCER W/STOPCOCK (MISCELLANEOUS) ×2 IMPLANT
WIRE EMERALD 3MM-J .035X150CM (WIRE) ×1 IMPLANT
WIRE HI TORQ VERSACORE-J 145CM (WIRE) ×1 IMPLANT

## 2016-10-23 NOTE — Progress Notes (Signed)
Progress Note  Patient Name: Roger [name] Date of Encounter: 10/23/2016  Primary Cardiologist: Oval Linsey  Subjective   No angina or dyspnea. A little sore at HD cath site. No problems during first HD session. Remains relatively bradycardiac, SBP a little high.  Inpatient Medications    Scheduled Meds: . amLODipine  10 mg Oral Daily  . atorvastatin  80 mg Oral q1800  . carvedilol  3.125 mg Oral BID WC  . darbepoetin (ARANESP) injection - NON-DIALYSIS  150 mcg Subcutaneous Q Mon-1800  . febuxostat  80 mg Oral Daily  . hydrALAZINE  50 mg Oral Q8H  . multivitamin  1 tablet Oral QHS  . ondansetron (ZOFRAN) IV  4 mg Intravenous Once  . pantoprazole  40 mg Oral QAC breakfast  . sodium chloride flush  3 mL Intravenous Q12H   Continuous Infusions: . sodium chloride    . sodium chloride 10 mL/hr at 10/23/16 0627  . sodium chloride Stopped (10/23/16 0359)  . ferric gluconate (FERRLECIT/NULECIT) IV     PRN Meds: sodium chloride, acetaminophen **OR** acetaminophen, hydrALAZINE, sodium chloride flush   Vital Signs    Vitals:   10/23/16 0300 10/23/16 0400 10/23/16 0401 10/23/16 0740  BP: (!) 167/74 (!) 161/66  (!) 138/58  Pulse: (!) 53 (!) 58  61  Resp: (!) 22 14  18   Temp: 98.3 F (36.8 C) 98.8 F (37.1 C)  99.1 F (37.3 C)  TempSrc: Oral Oral  Oral  SpO2: 100% 100%  98%  Weight: 205 lb 0.4 oz (93 kg)  203 lb 14.8 oz (92.5 kg)   Height:        Intake/Output Summary (Last 24 hours) at 10/23/16 1004 Last data filed at 10/23/16 0400  Gross per 24 hour  Intake              975 ml  Output             1000 ml  Net              -25 ml   Filed Weights   10/22/16 2352 10/23/16 0300 10/23/16 0401  Weight: 205 lb 0.4 oz (93 kg) 205 lb 0.4 oz (93 kg) 203 lb 14.8 oz (92.5 kg)    Telemetry    NSr - Personally Reviewed  ECG    No new tracing - Personally Reviewed  Physical Exam  Lying flat , relaxed  GEN: No acute distress.  Pale conjunctivae Neck: No JVD. Healthy  dressing at  R IJ cath site Cardiac: RRR, no murmurs, rubs, or gallops.  Respiratory: Clear to auscultation bilaterally.  GI: Soft, nontender, non-distended  MS: No edema; No deformity. Neuro:  Nonfocal  Psych: Normal affect   Labs    Chemistry Recent Labs Lab 10/19/16 1058 10/20/16 0700 10/21/16 0607 10/22/16 0229 10/23/16 0613  NA 139 138 142 141  140 140  K 6.0* 5.1 5.1 4.7  4.7 4.1  CL 115* 114* 117* 117*  117* 111  CO2 18* 15* 16* 14*  15* 19*  GLUCOSE 91 88 93 95  97 84  BUN 45* 38* 31* 28*  29* 17  CREATININE 5.38* 5.21* 5.09* 4.86*  4.86* 3.66*  CALCIUM 8.4* 8.0* 7.8* 7.9*  7.9* 7.7*  PROT 6.9 6.6 6.1*  --   --   ALBUMIN 3.7 3.5 3.3* 3.2* 3.0*  AST 21 22 19   --   --   ALT 11* 11* 9*  --   --   ALKPHOS 122  125 117  --   --   BILITOT 0.5 0.8 0.7  --   --   GFRNONAA 9* 10* 10* 11*  11* 15*  GFRAA 11* 11* 12* 12*  12* 18*  ANIONGAP 6 9 9 10  8 10      Hematology Recent Labs Lab 10/21/16 0607 10/22/16 0229 10/23/16 0613  WBC 6.5 7.7 7.2  RBC 2.66* 2.62* 2.99*  HGB 7.6* 7.6* 8.6*  HCT 23.9* 23.5* 26.6*  MCV 89.8 89.7 89.0  MCH 28.6 29.0 28.8  MCHC 31.8 32.3 32.3  RDW 17.1* 17.2* 16.4*  PLT 198 195 165    Cardiac Enzymes Recent Labs Lab 10/19/16 0636 10/19/16 1058 10/19/16 1559  TROPONINI 2.68* 3.40* 2.98*    Recent Labs Lab 10/18/16 2114  TROPIPOC 2.31*     BNPNo results for input(s): BNP, PROBNP in the last 168 hours.   DDimer No results for input(s): DDIMER in the last 168 hours.   Radiology    Nm Myocar Multi W/spect W/wall Motion / Ef  Result Date: 10/21/2016  Downsloping ST segment depression ST segment depression of 1 mm was noted during stress in the II, III, aVF, V4, V5 and V6 leads, beginning at 0 minutes of stress, and returning to baseline after 5-9 minutes of recovery.  Defect 1: There is a large defect of severe severity.  Findings consistent with ischemia.  This is a high risk study.  Nuclear stress EF: 40%.   Large size, severe intensity reversible (SDS 7) anterior, apical and inferolateral perfusion defect suggestive of ischemia. LVEF 40% with inferolateral wall hypokinesis. This is a high risk study.   Dg Chest Port 1 View  Result Date: 10/22/2016 CLINICAL DATA:  Dialysis catheter insertion. EXAM: PORTABLE CHEST 1 VIEW COMPARISON:  10/18/2016 FINDINGS: Right jugular dialysis catheter has been placed. Catheter tip in the region of the right atrium. Hazy densities in the central aspects of the chest compatible with mild edema. Negative for a pneumothorax. Heart size is within normal limits. Atherosclerotic calcifications at the aortic arch. IMPRESSION: Dialysis catheter tip in the upper right atrium region. Negative for pneumothorax. Central vascular congestion or mild edema. Electronically Signed   By: Markus Daft M.D.   On: 10/22/2016 12:56   Dg Fluoro Guide Cv Line-no Report  Result Date: 10/22/2016 Fluoroscopy was utilized by the requesting physician.  No radiographic interpretation.    Cardiac Studies   ECHO 10/20/2016 - Left ventricle: The cavity size was normal. There was mild focal basal hypertrophy of the septum. Systolic function was normal. The estimated ejection fraction was in the range of 55% to 60%. Wall motion was normal; there were no regional wall motion abnormalities. Features are consistent with a pseudonormal left ventricular filling pattern, with concomitant abnormal relaxation and increased filling pressure (grade 2 diastolic dysfunction). Doppler parameters are consistent with high ventricular filling pressure. - Aortic valve: Transvalvular velocity was within the normal range. There was no stenosis. There was no regurgitation. - Mitral valve: Transvalvular velocity was within the normal range. There was no evidence for stenosis. There was trivial regurgitation. - Left atrium: The atrium was mildly dilated. - Right ventricle: The cavity size was normal. Wall thickness was normal.  Systolic function was normal. - Tricuspid valve: There was no regurgitation.  MYOVIEW 10/21/2016 Study Result     Downsloping ST segment depression ST segment depression of 1 mm was noted during stress in the II, III, aVF, V4, V5 and V6 leads, beginning at 0 minutes of stress, and  returning to baseline after 5-9 minutes of recovery.  Defect 1: There is a large defect of severe severity.  Findings consistent with ischemia.  This is a high risk study.  Nuclear stress EF: 40%.   Large size, severe intensity reversible (SDS 7) anterior, apical and inferolateral perfusion defect suggestive of ischemia. LVEF 40% with inferolateral wall hypokinesis. This is a high risk study.       Patient Profile     76 y.o. male with high risk nuclear study and small NSTEMI, due to demand ischemia in setting of severe anemia (acute blood loss from colonic angiodysplasia, iron deficiency, near-ESRD), history of PAD, smoking  Assessment & Plan    1. CAD/NSTEMI: for cath today. He is at high risk for complications with either CABG or PCI, from both renal and GI bleeding point of view. We have reviewed the need for coronary angiography and the high likelihood of further deterioration in renal function, even possible anuria after cath. He may have a surgical indication (for CABG). This would be a high risk procedure, but might be his best or only option. It would come with a very high risk of serious GI bleeding and anuric renal failure. If PCI is an option (say he has isolated and discrete LAD disease), then we will have to deal with the risk of GI bleeding while on DAPT. Depending on the anatomy, BMS might be the better choice (if the lesion is short and the vessel is large in caliber). BMS would have the advantage of shorter DAPT duration, but would come with higher risk of restenosis. He does not have DM. 2. PAD with stable intermittent claudication 3. Smoking, cessation advised. 4. Anemia/recent GI  bleeding:  Colonic angiodysplasia treated, EGD negative. Received transfusion last night during HD. 5. HTN: note elevated SBP, but at times has markedly decreased diastolic BP. Persistent bradycardia. This limits the use of beta blockers.  Signed, Sanda Klein, MD  10/23/2016, 10:04 AM

## 2016-10-23 NOTE — Progress Notes (Signed)
Physical Therapy Treatment Note    PT Comments: Pt presented awake and alert in bed. Pt was cooperative but impulsive and liked to joke around. Pt was supervision for bed mobility requiring extra time and cues to scoot hips toward EOB once sitting. Pt was supervision for sit to stand and required cues for hand placement with walker to stand/sit. Pt did not follow cues and initiated sit to stand while instruction was being given, and stated that it is easier and he wanted to do it his way. Pt was min guard for walking, and walked quickly on purpose in order to get a reaction from therapists. Upon starting to walk pt undergarments were falling down and pt let go of walker with one hand to  fix clothes demonstrating static standing balance ability and minimal lateral weight shift. Pt will benefit from skilled acute PT services to increase balance, strength and safety for discharge to next venue. Current discharge plan remains appropriate. Pt showed apprehension about home health PT services stating he doesn't like letting people he doesn't know into his house. We discussed benefits and pt stated he will think about it.      10/23/16 1200  PT Visit Information  Last PT Received On 10/23/16  Assistance Needed +1  History of Present Illness pt is a 76 y/o male with pmh significant for CKD stage 4/5, PVD, p. afib, anemia, HTN, admitted with c/o N/V with bloody emesis, diaphoresis, burning dhest pain.  Work up found acute GI bleed, NSTEMI, AKI.  pt s/p Endo, Colonoscopy and HD catheter in R internal jugular.  Subjective Data  Patient Stated Goal Get home and be able to do for myself  Precautions  Precautions Fall  Restrictions  Weight Bearing Restrictions No  Pain Assessment  Faces Pain Scale 0  Cognition  Arousal/Alertness Awake/alert  Behavior During Therapy WFL for tasks assessed/performed  Overall Cognitive Status Within Functional Limits for tasks assessed  Bed Mobility  Overal bed mobility  Needs Assistance (Increased Time )  Bed Mobility Supine to Sit;Sit to Supine  Supine to sit Supervision  Sit to supine Supervision  General bed mobility comments Slow, multiple attmpts to come to sit, and rail used.  Transfers  Overall transfer level Needs assistance  Equipment used Rolling walker (2 wheeled) (Pt needed cues for hand placemnt and would not follow rec. )  Transfers Sit to/from Stand  Sit to Stand Supervision  General transfer comment Pt required cues for hand placement with walker for sit to/from stands and did not listen and stood/sat with both hands on walker.   Ambulation/Gait  Ambulation/Gait assistance Min guard  Assistive device Rolling walker (2 wheeled)  Gait Pattern/deviations Step-through pattern  General Gait Details Pt was impulsive and walked very fast requiring cues to slow down.  Balance  Overall balance assessment Modified Independent  Sitting-balance support Single extremity supported  Sitting balance-Leahy Scale Good  Standing balance support Single extremity supported  Standing balance-Leahy Scale Fair  PT - End of Session  Equipment Utilized During Treatment Gait belt  Activity Tolerance Patient tolerated treatment well  Patient left in bed;with call bell/phone within reach;with bed alarm set  Nurse Communication Other (comment) (Need to use bedside camode )  PT - Assessment/Plan  PT Plan Current plan remains appropriate  PT Visit Diagnosis Difficulty in walking, not elsewhere classified (R26.2);Muscle weakness (generalized) (M62.81)  PT Frequency (ACUTE ONLY) Min 3X/week  Follow Up Recommendations Home health PT;Supervision for mobility/OOB  PT equipment Other (comment) Media planner)  AM-PAC PT "6 Clicks" Daily Activity Outcome Measure  Difficulty turning over in bed (including adjusting bedclothes, sheets and blankets)? 3  Difficulty moving from lying on back to sitting on the side of the bed?  3  Difficulty sitting down on and standing  up from a chair with arms (e.g., wheelchair, bedside commode, etc,.)? 4  Help needed moving to and from a bed to chair (including a wheelchair)? 3  Help needed walking in hospital room? 3  Help needed climbing 3-5 steps with a railing?  3  6 Click Score 19  Mobility G Code  CJ  Acute Rehab PT Goals  PT Goal Formulation With patient  Time For Goal Achievement 11/05/16  Potential to Achieve Goals Good  PT Time Calculation  PT Start Time (ACUTE ONLY) 1150  PT Stop Time (ACUTE ONLY) 1221  PT Time Calculation (min) (ACUTE ONLY) 31 min  PT General Charges  $$ ACUTE PT VISIT 1 Visit  PT Treatments  $Gait Training 23-37 mins   Drue Stager SPT Acute Rehabilitation Services Office: [phone]  I was present during the PT session and agree with patient status and findings as outlined by Nicole Kindred, SPT.  Roney Marion, [place]  Acute Rehabilitation Services Pager [phone] Office [phone]

## 2016-10-23 NOTE — Progress Notes (Signed)
PROGRESS NOTE  EDIN KON TWS:[id number] DOB: 07/27/1940 DOA: 10/18/2016 PCP: [name], MD  HPI/Recap of past 24 hours:  Npo , awaiting for cardiac cath today, daughter at bedside He denies chest pain, no fever, no edema, he does endorse bilateral arm pain that is chronic He denies abdominal pain, no n/v, denies constipation or diarrhea, denies blood in stool  Assessment/Plan: Principal Problem:   Elevated troponin Active Problems:   Chest pain   ARF (acute renal failure) (HCC)   Hyperkalemia   Blood loss anemia   Demand ischemia (HCC)   Lower GI bleed   Tobacco abuse   CKD (chronic kidney disease) stage 5, GFR less than 15 ml/min (HCC)   Melena   Angiodysplasia of cecum   Benign neoplasm of cecum   Benign neoplasm of ascending colon   Benign neoplasm of descending colon   Acute GI bleed with acute blood loss anemia: resolved ,Status post transfusion of 2  units  packed RBCs ,  hemoglobin around 8 stable ,EGD normal /Colonoscopy showed angiodysplasia with diverticulosis with no apparent bleeding . no more apparent GI bleed, he was on  PPI drip, now on po ppi, adavnce the diet . On review of his home meds, he is on prednisone prior to admission, hold asa for now  Type 2 non-ST elevation MI in the setting of supply demand mismatch ( WITH UNDERLYING SEVER CAD) in the setting of anemia/GI bleed chest pain-free for now, troponin peaked at 3.4 trending down.  continue beta blocker , hold aspirin or any anticoagulation in the setting of GI bleed, stress test showed large reversible ischemia ,  cath on 8/8 after first  HD on 8/7 .  AKI/CKD IVwith chronic related anemia/now ESRD started on HD on 8/7 : Progressive CKD , Related to dehydration and anemia, complicated with hyperkalemia (k 6 on admission, resolved), HD initiated on 8/7, will follow nephrology recommendations.  HTN : Continue coreg/Norvasc , Hydralazine increased , BP will get better after HD.  H/o gout , he has  been on uloric ( will need to discuss with patient/nephrology/cardiology about switch to allopurinol due to concerns of cardiac side effect reported on uloric.  Hx of PAD with stable bilateral lower extremity claudication, he is followed by vascular surgery  Dr Scot Dock in the past ( I have personally reviewed outpatient notes from vascular surgery)  H/o Prostate cancer s/p seeds implants  H/o AFib? Has been sinus rhythm since admission ( I personally review  tele strip  Daily)  Cigarettes smoking: smoking cessation education provided at length   Body mass index is 31.94 kg/m.   Code Status: full, comfirmed  Family Communication: patient and daughter at bedside  Disposition Plan: remain in stepdown   Consultants:  GI   Cardiology  Vascular surgery  Procedures:  Tunneled catheter placement for HD on 8/7  Egd/colonoscopy  Stress test   cardiac cath on 8/8  HD started on 8/7  prbc transfusion  Antibiotics:  none   Objective: BP (!) 161/66 (BP Location: Left Arm)   Pulse (!) 58   Temp 98.8 F (37.1 C) (Oral)   Resp 14   Ht 5\' 7"  (1.702 m)   Wt 92.5 kg (203 lb 14.8 oz)   SpO2 100%   BMI 31.94 kg/m   Intake/Output Summary (Last 24 hours) at 10/23/16 0730 Last data filed at 10/23/16 0400  Gross per 24 hour  Intake  975 ml  Output             1000 ml  Net              -25 ml   Filed Weights   10/22/16 2352 10/23/16 0300 10/23/16 0401  Weight: 93 kg (205 lb 0.4 oz) 93 kg (205 lb 0.4 oz) 92.5 kg (203 lb 14.8 oz)    Exam: Patient is examined daily including today on 10/23/2016, exam remains the same as of yesterday except that is highlighted below   General:  NAD, poor dentition , right sided dialysis catheter in place  Cardiovascular: RRR  Respiratory: CTABL  Abdomen: Soft/ND/NT, positive BS  Musculoskeletal: No Edema  Neuro: aaox3  Data Reviewed: Basic Metabolic Panel:  Recent Labs Lab 10/19/16 0636 10/19/16 1058  10/20/16 0700 10/21/16 0607 10/22/16 0229 10/23/16 0613  NA  --  139 138 142 141  140 140  K  --  6.0* 5.1 5.1 4.7  4.7 4.1  CL  --  115* 114* 117* 117*  117* 111  CO2  --  18* 15* 16* 14*  15* 19*  GLUCOSE  --  91 88 93 95  97 84  BUN  --  45* 38* 31* 28*  29* 17  CREATININE  --  5.38* 5.21* 5.09* 4.86*  4.86* 3.66*  CALCIUM  --  8.4* 8.0* 7.8* 7.9*  7.9* 7.7*  MG 2.0  --   --   --   --   --   PHOS 4.2  --   --   --  4.0 3.0   Liver Function Tests:  Recent Labs Lab 10/19/16 1058 10/20/16 0700 10/21/16 0607 10/22/16 0229 10/23/16 0613  AST 21 22 19   --   --   ALT 11* 11* 9*  --   --   ALKPHOS 122 125 117  --   --   BILITOT 0.5 0.8 0.7  --   --   PROT 6.9 6.6 6.1*  --   --   ALBUMIN 3.7 3.5 3.3* 3.2* 3.0*   No results for input(s): LIPASE, AMYLASE in the last 168 hours. No results for input(s): AMMONIA in the last 168 hours. CBC:  Recent Labs Lab 10/19/16 1058 10/20/16 0700 10/21/16 0607 10/22/16 0229 10/23/16 0613  WBC 8.3 8.1 6.5 7.7 7.2  HGB 8.2* 8.2* 7.6* 7.6* 8.6*  HCT 25.2* 25.8* 23.9* 23.5* 26.6*  MCV 89.4 89.6 89.8 89.7 89.0  PLT 184 210 198 195 165   Cardiac Enzymes:    Recent Labs Lab 10/19/16 0636 10/19/16 1058 10/19/16 1559  TROPONINI 2.68* 3.40* 2.98*   BNP (last 3 results) No results for input(s): BNP in the last 8760 hours.  ProBNP (last 3 results) No results for input(s): PROBNP in the last 8760 hours.  CBG: No results for input(s): GLUCAP in the last 168 hours.  Recent Results (from the past 240 hour(s))  MRSA PCR Screening     Status: None   Collection Time: 10/19/16  5:20 AM  Result Value Ref Range Status   MRSA by PCR NEGATIVE NEGATIVE Final    Comment:        The GeneXpert MRSA Assay (FDA approved for NASAL specimens only), is one component of a comprehensive MRSA colonization surveillance program. It is not intended to diagnose MRSA infection nor to guide or monitor treatment for MRSA infections.       Studies: Dg Chest Port 1 View  Result Date: 10/22/2016 CLINICAL DATA:  Dialysis  catheter insertion. EXAM: PORTABLE CHEST 1 VIEW COMPARISON:  10/18/2016 FINDINGS: Right jugular dialysis catheter has been placed. Catheter tip in the region of the right atrium. Hazy densities in the central aspects of the chest compatible with mild edema. Negative for a pneumothorax. Heart size is within normal limits. Atherosclerotic calcifications at the aortic arch. IMPRESSION: Dialysis catheter tip in the upper right atrium region. Negative for pneumothorax. Central vascular congestion or mild edema. Electronically Signed   By: Markus Daft M.D.   On: 10/22/2016 12:56   Dg Fluoro Guide Cv Line-no Report  Result Date: 10/22/2016 Fluoroscopy was utilized by the requesting physician.  No radiographic interpretation.    Scheduled Meds: . amLODipine  10 mg Oral Daily  . atorvastatin  80 mg Oral q1800  . carvedilol  3.125 mg Oral BID WC  . darbepoetin (ARANESP) injection - NON-DIALYSIS  150 mcg Subcutaneous Q Mon-1800  . febuxostat  80 mg Oral Daily  . hydrALAZINE  50 mg Oral Q8H  . multivitamin  1 tablet Oral QHS  . ondansetron (ZOFRAN) IV  4 mg Intravenous Once  . pantoprazole  40 mg Oral QAC breakfast  . sodium chloride flush  3 mL Intravenous Q12H    Continuous Infusions: . sodium chloride    . sodium chloride 10 mL/hr at 10/23/16 0627  . sodium chloride Stopped (10/23/16 0359)  . ferric gluconate (FERRLECIT/NULECIT) IV       Time spent: 36mins I have personally reviewed and interpreted daily labs, tele strips, imagings as discussed above under data review session and assessment and plans.  Jaely Silman MD, PhD  [facility] Pager [phone]. If 7PM-7AM, please contact night-coverage at [email or web address], password [facility] 10/23/2016, 7:30 AM  LOS: 5 days

## 2016-10-23 NOTE — Progress Notes (Signed)
Tubular adenomas No recall needed due to age and co-morbidities No letter  I explained this to him and daughter at hospital

## 2016-10-23 NOTE — Interval H&P Note (Signed)
Cath Lab Visit (complete for each Cath Lab visit)  Clinical Evaluation Leading to the Procedure:   ACS: Yes.    Non-ACS:  n/a   History and Physical Interval Note:  10/23/2016 1:22 PM  Roger [name]  has presented today for surgery, with the diagnosis of abnormal stress test  The various methods of treatment have been discussed with the patient and family. After consideration of risks, benefits and other options for treatment, the patient has consented to  Procedure(s): LEFT HEART CATH AND CORONARY ANGIOGRAPHY (N/A) as a surgical intervention .  The patient's history has been reviewed, patient examined, no change in status, stable for surgery.  I have reviewed the patient's chart and labs.  Questions were answered to the patient's satisfaction.     [name]

## 2016-10-23 NOTE — Progress Notes (Signed)
[facility]  Subjective/interim history:  TDC placed yesterday R IJ (Dr. Oneida Alar) Had 1st HD yesterday finished about 4AM today Tired but no other complaints   Vitals:   10/23/16 0230 10/23/16 0300 10/23/16 0400 10/23/16 0401  BP: (!) 153/67 (!) 167/74 (!) 161/66   Pulse: (!) 51 (!) 53 (!) 58   Resp: (!) 23 (!) 22 14   Temp:  98.3 F (36.8 C) 98.8 F (37.1 C)   TempSrc:  Oral Oral   SpO2:  100% 100%   Weight:  93 kg (205 lb 0.4 oz)  92.5 kg (203 lb 14.8 oz)  Height:       Exam: Older AAM, covers up over head NAD lying flat Poor dentition No JVD Lungs clear S1S2 No S3 Abd obese, soft, not tender No LE edema, skin dry and ashy R sided TDC with dry dressing in place (8/7)  Inpatient medications: . amLODipine  10 mg Oral Daily  . atorvastatin  80 mg Oral q1800  . carvedilol  3.125 mg Oral BID WC  . darbepoetin (ARANESP) injection - NON-DIALYSIS  150 mcg Subcutaneous Q Mon-1800  . febuxostat  80 mg Oral Daily  . hydrALAZINE  50 mg Oral Q8H  . ondansetron (ZOFRAN) IV  4 mg Intravenous Once  . pantoprazole  40 mg Oral QAC breakfast  . sodium chloride flush  3 mL Intravenous Q12H   . sodium chloride    . sodium chloride 10 mL/hr at 10/23/16 0627  . sodium chloride Stopped (10/23/16 0359)  . ferric gluconate (FERRLECIT/NULECIT) IV     sodium chloride, acetaminophen **OR** acetaminophen, hydrALAZINE, sodium chloride flush  (Renal PTA history) Creat               Date                 eGFR 2012                2.7- 3.5 2013                1.95- 2.49 2014                2.42- 3.32        20- 25 10/18/16              5.74 10/19/16              5.38                 10       Recent Labs  10/22/16 0229 10/23/16 0613  WBC 7.7 7.2  HGB 7.6* 8.6*  HCT 23.5* 26.6*  MCV 89.7 89.0  PLT 195 165    Recent Labs  10/22/16 0229  FERRITIN 88  TIBC 242*  IRON 24*     Recent Labs Lab 10/19/16 0636  10/21/16 0607 10/22/16 0229 10/23/16 0613  NA  --   < >  142 141  140 140  K  --   < > 5.1 4.7  4.7 4.1  CL  --   < > 117* 117*  117* 111  CO2  --   < > 16* 14*  15* 19*  GLUCOSE  --   < > 93 95  97 84  BUN  --   < > 31* 28*  29* 17  CREATININE  --   < > 5.09* 4.86*  4.86* 3.66*  CALCIUM  --   < > 7.8* 7.9*  7.9* 7.7*  PHOS 4.2  --   --  4.0 3.0  < > = values in this interval not displayed.  Recent Labs Lab 10/19/16 1058 10/20/16 0700 10/21/16 0607 10/22/16 0229  AST _0 --   ALT 11* 11* 9*  --   ALKPHOS 122 125 117  --   BILITOT 0.5 0.8 0.7  --   PROT 6.9 6.6 6.1*  --   ALBUMIN 3.7 3.5 3.3* 3.2*       Component Value Date/Time   IRON 24 (L) 10/22/2016 0229   TIBC 242 (L) 10/22/2016 0229   FERRITIN 88 10/22/2016 0229   IRONPCTSAT 10 (L) 10/22/2016 0229     Impression/Recommendations: 1. Renal failure/new ESRD - eGFR 2014 was 20  and was f/b [facility] nephrology (Dr. Joelyn Oms) but did not keep appts for a year or two. Presented with creatinine of 5 and uremic sx. Felt ESRD.  1. Opted for HD.  2. TDC placed 8/7,  3. HD #1 8/7. Will have HD #2 on 8/9 4. Permanent access postponed for now d/t need for heart cath.  5. CLIP process initiated.  2. CAD   1. Nuclear stress test large size, severe intensity reversible anterior, apical and inferolateral perfusion defect suggestive of ischemia.  2. LVEF 40% with inferolateral wall hypokinesis.   3. Heart cath today 3. Rectal bleeding/ anemia - EGD neg. Colonoscopy angiodysplasia/tics. No active bleeding now.  1. S/p total 3 U blood this admission 2. Started Aranesp 200/week 8/6.  3. Repleting Fe 250 IV QD X 4 for low Tsat of 10%. 4. PTH still pending  4. HTN - hydralazine/coreg/amlodipine 5. AFib  6. Hx DVT 7. PAD hx claudication 8. Hx prostate cancer rx seed implants 9. Gout  Jamal Maes, MD [facility] [phone] Pager 10/23/2016, 7:20 AM

## 2016-10-23 NOTE — H&P (View-Only) (Signed)
Progress Note  Patient Name: Roger [name] Date of Encounter: 10/23/2016  Primary Cardiologist: Oval Linsey  Subjective   No angina or dyspnea. A little sore at HD cath site. No problems during first HD session. Remains relatively bradycardiac, SBP a little high.  Inpatient Medications    Scheduled Meds: . amLODipine  10 mg Oral Daily  . atorvastatin  80 mg Oral q1800  . carvedilol  3.125 mg Oral BID WC  . darbepoetin (ARANESP) injection - NON-DIALYSIS  150 mcg Subcutaneous Q Mon-1800  . febuxostat  80 mg Oral Daily  . hydrALAZINE  50 mg Oral Q8H  . multivitamin  1 tablet Oral QHS  . ondansetron (ZOFRAN) IV  4 mg Intravenous Once  . pantoprazole  40 mg Oral QAC breakfast  . sodium chloride flush  3 mL Intravenous Q12H   Continuous Infusions: . sodium chloride    . sodium chloride 10 mL/hr at 10/23/16 0627  . sodium chloride Stopped (10/23/16 0359)  . ferric gluconate (FERRLECIT/NULECIT) IV     PRN Meds: sodium chloride, acetaminophen **OR** acetaminophen, hydrALAZINE, sodium chloride flush   Vital Signs    Vitals:   10/23/16 0300 10/23/16 0400 10/23/16 0401 10/23/16 0740  BP: (!) 167/74 (!) 161/66  (!) 138/58  Pulse: (!) 53 (!) 58  61  Resp: (!) 22 14  18   Temp: 98.3 F (36.8 C) 98.8 F (37.1 C)  99.1 F (37.3 C)  TempSrc: Oral Oral  Oral  SpO2: 100% 100%  98%  Weight: 205 lb 0.4 oz (93 kg)  203 lb 14.8 oz (92.5 kg)   Height:        Intake/Output Summary (Last 24 hours) at 10/23/16 1004 Last data filed at 10/23/16 0400  Gross per 24 hour  Intake              975 ml  Output             1000 ml  Net              -25 ml   Filed Weights   10/22/16 2352 10/23/16 0300 10/23/16 0401  Weight: 205 lb 0.4 oz (93 kg) 205 lb 0.4 oz (93 kg) 203 lb 14.8 oz (92.5 kg)    Telemetry    NSr - Personally Reviewed  ECG    No new tracing - Personally Reviewed  Physical Exam  Lying flat , relaxed  GEN: No acute distress.  Pale conjunctivae Neck: No JVD. Healthy  dressing at  R IJ cath site Cardiac: RRR, no murmurs, rubs, or gallops.  Respiratory: Clear to auscultation bilaterally.  GI: Soft, nontender, non-distended  MS: No edema; No deformity. Neuro:  Nonfocal  Psych: Normal affect   Labs    Chemistry Recent Labs Lab 10/19/16 1058 10/20/16 0700 10/21/16 0607 10/22/16 0229 10/23/16 0613  NA 139 138 142 141  140 140  K 6.0* 5.1 5.1 4.7  4.7 4.1  CL 115* 114* 117* 117*  117* 111  CO2 18* 15* 16* 14*  15* 19*  GLUCOSE 91 88 93 95  97 84  BUN 45* 38* 31* 28*  29* 17  CREATININE 5.38* 5.21* 5.09* 4.86*  4.86* 3.66*  CALCIUM 8.4* 8.0* 7.8* 7.9*  7.9* 7.7*  PROT 6.9 6.6 6.1*  --   --   ALBUMIN 3.7 3.5 3.3* 3.2* 3.0*  AST 21 22 19   --   --   ALT 11* 11* 9*  --   --   ALKPHOS 122  125 117  --   --   BILITOT 0.5 0.8 0.7  --   --   GFRNONAA 9* 10* 10* 11*  11* 15*  GFRAA 11* 11* 12* 12*  12* 18*  ANIONGAP 6 9 9 10  8 10      Hematology Recent Labs Lab 10/21/16 [phone] 10/22/16 0229 10/23/16 0613  WBC 6.5 7.7 7.2  RBC 2.66* 2.62* 2.99*  HGB 7.6* 7.6* 8.6*  HCT 23.9* 23.5* 26.6*  MCV 89.8 89.7 89.0  MCH 28.6 29.0 28.8  MCHC 31.8 32.3 32.3  RDW 17.1* 17.2* 16.4*  PLT 198 195 165    Cardiac Enzymes Recent Labs Lab 10/19/16 0636 10/19/16 1058 10/19/16 1559  TROPONINI 2.68* 3.40* 2.98*    Recent Labs Lab 10/18/16 2114  TROPIPOC 2.31*     BNPNo results for input(s): BNP, PROBNP in the last 168 hours.   DDimer No results for input(s): DDIMER in the last 168 hours.   Radiology    Nm Myocar Multi W/spect W/wall Motion / Ef  Result Date: 10/21/2016  Downsloping ST segment depression ST segment depression of 1 mm was noted during stress in the II, III, aVF, V4, V5 and V6 leads, beginning at 0 minutes of stress, and returning to baseline after 5-9 minutes of recovery.  Defect 1: There is a large defect of severe severity.  Findings consistent with ischemia.  This is a high risk study.  Nuclear stress EF: 40%.   Large size, severe intensity reversible (SDS 7) anterior, apical and inferolateral perfusion defect suggestive of ischemia. LVEF 40% with inferolateral wall hypokinesis. This is a high risk study.   Dg Chest Port 1 View  Result Date: 10/22/2016 CLINICAL DATA:  Dialysis catheter insertion. EXAM: PORTABLE CHEST 1 VIEW COMPARISON:  10/18/2016 FINDINGS: Right jugular dialysis catheter has been placed. Catheter tip in the region of the right atrium. Hazy densities in the central aspects of the chest compatible with mild edema. Negative for a pneumothorax. Heart size is within normal limits. Atherosclerotic calcifications at the aortic arch. IMPRESSION: Dialysis catheter tip in the upper right atrium region. Negative for pneumothorax. Central vascular congestion or mild edema. Electronically Signed   By: Markus Daft M.D.   On: 10/22/2016 12:56   Dg Fluoro Guide Cv Line-no Report  Result Date: 10/22/2016 Fluoroscopy was utilized by the requesting physician.  No radiographic interpretation.    Cardiac Studies   ECHO 10/20/2016 - Left ventricle: The cavity size was normal. There was mild focal basal hypertrophy of the septum. Systolic function was normal. The estimated ejection fraction was in the range of 55% to 60%. Wall motion was normal; there were no regional wall motion abnormalities. Features are consistent with a pseudonormal left ventricular filling pattern, with concomitant abnormal relaxation and increased filling pressure (grade 2 diastolic dysfunction). Doppler parameters are consistent with high ventricular filling pressure. - Aortic valve: Transvalvular velocity was within the normal range. There was no stenosis. There was no regurgitation. - Mitral valve: Transvalvular velocity was within the normal range. There was no evidence for stenosis. There was trivial regurgitation. - Left atrium: The atrium was mildly dilated. - Right ventricle: The cavity size was normal. Wall thickness was normal.  Systolic function was normal. - Tricuspid valve: There was no regurgitation.  MYOVIEW 10/21/2016 Study Result     Downsloping ST segment depression ST segment depression of 1 mm was noted during stress in the II, III, aVF, V4, V5 and V6 leads, beginning at 0 minutes of stress, and  returning to baseline after 5-9 minutes of recovery.  Defect 1: There is a large defect of severe severity.  Findings consistent with ischemia.  This is a high risk study.  Nuclear stress EF: 40%.   Large size, severe intensity reversible (SDS 7) anterior, apical and inferolateral perfusion defect suggestive of ischemia. LVEF 40% with inferolateral wall hypokinesis. This is a high risk study.       Patient Profile     76 y.o. male with high risk nuclear study and small NSTEMI, due to demand ischemia in setting of severe anemia (acute blood loss from colonic angiodysplasia, iron deficiency, near-ESRD), history of PAD, smoking  Assessment & Plan    1. CAD/NSTEMI: for cath today. He is at high risk for complications with either CABG or PCI, from both renal and GI bleeding point of view. We have reviewed the need for coronary angiography and the high likelihood of further deterioration in renal function, even possible anuria after cath. He may have a surgical indication (for CABG). This would be a high risk procedure, but might be his best or only option. It would come with a very high risk of serious GI bleeding and anuric renal failure. If PCI is an option (say he has isolated and discrete LAD disease), then we will have to deal with the risk of GI bleeding while on DAPT. Depending on the anatomy, BMS might be the better choice (if the lesion is short and the vessel is large in caliber). BMS would have the advantage of shorter DAPT duration, but would come with higher risk of restenosis. He does not have DM. 2. PAD with stable intermittent claudication 3. Smoking, cessation advised. 4. Anemia/recent GI  bleeding:  Colonic angiodysplasia treated, EGD negative. Received transfusion last night during HD. 5. HTN: note elevated SBP, but at times has markedly decreased diastolic BP. Persistent bradycardia. This limits the use of beta blockers.  Signed, Sanda Klein, MD  10/23/2016, 10:04 AM

## 2016-10-23 NOTE — Anesthesia Postprocedure Evaluation (Signed)
Anesthesia Post Note  Patient: Roger [name]  Procedure(s) Performed: Procedure(s) (LRB): COLONOSCOPY WITH PROPOFOL (N/A) ESOPHAGOGASTRODUODENOSCOPY (EGD) (N/A)     Patient location during evaluation: PACU Anesthesia Type: MAC Level of consciousness: awake and alert Pain management: pain level controlled Vital Signs Assessment: post-procedure vital signs reviewed and stable Respiratory status: spontaneous breathing, nonlabored ventilation, respiratory function stable and patient connected to nasal cannula oxygen Cardiovascular status: stable and blood pressure returned to baseline Anesthetic complications: no    Last Vitals:  Vitals:   10/23/16 0400 10/23/16 0740  BP: (!) 161/66 (!) 138/58  Pulse: (!) 58 61  Resp: 14 18  Temp: 37.1 C 37.3 C    Last Pain:  Vitals:   10/23/16 0740  TempSrc: Oral  PainSc:    Pain Goal:                 Riccardo Dubin

## 2016-10-24 ENCOUNTER — Encounter (HOSPITAL_COMMUNITY): Payer: Self-pay | Admitting: Cardiovascular Disease

## 2016-10-24 DIAGNOSIS — N185 Chronic kidney disease, stage 5: Secondary | ICD-10-CM

## 2016-10-24 LAB — CBC
HCT: 27.9 % — ABNORMAL LOW (ref 39.0–52.0)
HEMATOCRIT: 29.3 % — AB (ref 39.0–52.0)
HEMOGLOBIN: 9 g/dL — AB (ref 13.0–17.0)
HEMOGLOBIN: 8.9 g/dL — AB (ref 13.0–17.0)
MCH: 27.8 pg (ref 26.0–34.0)
MCH: 29 pg (ref 26.0–34.0)
MCHC: 30.7 g/dL (ref 30.0–36.0)
MCHC: 31.9 g/dL (ref 30.0–36.0)
MCV: 90.4 fL (ref 78.0–100.0)
MCV: 90.9 fL (ref 78.0–100.0)
Platelets: 186 10*3/uL (ref 150–400)
Platelets: 177 10*3/uL (ref 150–400)
RBC: 3.24 MIL/uL — AB (ref 4.22–5.81)
RBC: 3.07 MIL/uL — AB (ref 4.22–5.81)
RDW: 16.5 % — AB (ref 11.5–15.5)
RDW: 16.5 % — ABNORMAL HIGH (ref 11.5–15.5)
WBC: 7.3 10*3/uL (ref 4.0–10.5)
WBC: 7.9 10*3/uL (ref 4.0–10.5)

## 2016-10-24 LAB — BPAM RBC
BLOOD PRODUCT EXPIRATION DATE: 201809052359
ISSUE DATE / TIME: 201808080118
UNIT TYPE AND RH: 5100

## 2016-10-24 LAB — RENAL FUNCTION PANEL
ALBUMIN: 3.3 g/dL — AB (ref 3.5–5.0)
ANION GAP: 8 (ref 5–15)
Albumin: 3.1 g/dL — ABNORMAL LOW (ref 3.5–5.0)
Anion gap: 10 (ref 5–15)
BUN: 19 mg/dL (ref 6–20)
BUN: 25 mg/dL — ABNORMAL HIGH (ref 6–20)
CALCIUM: 7.8 mg/dL — AB (ref 8.9–10.3)
CALCIUM: 7.8 mg/dL — AB (ref 8.9–10.3)
CO2: 19 mmol/L — AB (ref 22–32)
CO2: 19 mmol/L — ABNORMAL LOW (ref 22–32)
CREATININE: 4.4 mg/dL — AB (ref 0.61–1.24)
Chloride: 112 mmol/L — ABNORMAL HIGH (ref 101–111)
Chloride: 114 mmol/L — ABNORMAL HIGH (ref 101–111)
Creatinine, Ser: 4.67 mg/dL — ABNORMAL HIGH (ref 0.61–1.24)
GFR calc Af Amer: 14 mL/min — ABNORMAL LOW (ref >60)
GFR calc Af Amer: 13 mL/min — ABNORMAL LOW (ref >60)
GFR calc non Af Amer: 12 mL/min — ABNORMAL LOW (ref >60)
GFR, EST NON AFRICAN AMERICAN: 11 mL/min — AB (ref >60)
GLUCOSE: 98 mg/dL (ref 65–99)
Glucose, Bld: 109 mg/dL — ABNORMAL HIGH (ref 65–99)
PHOSPHORUS: 3 mg/dL (ref 2.5–4.6)
Phosphorus: 3 mg/dL (ref 2.5–4.6)
Potassium: 4.3 mmol/L (ref 3.5–5.1)
Potassium: 4.4 mmol/L (ref 3.5–5.1)
SODIUM: 141 mmol/L (ref 135–145)
SODIUM: 141 mmol/L (ref 135–145)

## 2016-10-24 LAB — HEPATITIS B SURFACE ANTIBODY,QUALITATIVE: HEP B S AB: NONREACTIVE

## 2016-10-24 LAB — TYPE AND SCREEN
ABO/RH(D): O POS
Antibody Screen: NEGATIVE
Unit division: 0

## 2016-10-24 LAB — C DIFFICILE QUICK SCREEN W PCR REFLEX
C DIFFICILE (CDIFF) INTERP: NOT DETECTED
C DIFFICILE (CDIFF) TOXIN: NEGATIVE
C Diff antigen: NEGATIVE

## 2016-10-24 LAB — HEPATITIS B CORE ANTIBODY, TOTAL: HEP B C TOTAL AB: NEGATIVE

## 2016-10-24 MED ORDER — HEPARIN SODIUM (PORCINE) 1000 UNIT/ML DIALYSIS
1000.0000 [IU] | INTRAMUSCULAR | Status: DC | PRN
  Administered 2016-10-24: 1000 [IU] via INTRAVENOUS_CENTRAL
  Filled 2016-10-24: qty 1

## 2016-10-24 MED ORDER — ALTEPLASE 2 MG IJ SOLR: 2.0000 mg | Freq: Once | INTRAMUSCULAR | Status: DC | PRN

## 2016-10-24 NOTE — Care Management Note (Addendum)
Case Management Note  Patient Details  Name: Roger [name] MRN: [id number] Date of Birth: Aug 25, 1940  Subjective/Objective:  Pt admitted with blood in stool and epigastric discomfort                  Action/Plan:  PTA from home alone - friends help when needed.  Pt has walker and cane but doesn't use them often.  CM provided health connect number on AVS - per daughter "he cussed out the doctor" - its been over a year since pt has seen primary doctor.  Pt agreed to bedside commode - chose [facility] - agency contacted and referral accepted.   Daughter raised concerns with pts mobility at home - PT/OT ordered.  CM will continue to follow for discharge needs   Expected Discharge Date:  10/24/16               Expected Discharge Plan:     In-House Referral:     Discharge planning Services  CM Consult  Post Acute Care Choice:    Choice offered to:     DME Arranged:    DME Agency:     HH Arranged:    [place]:     Status of Service:     If discussed at [place] of Stay Meetings, dates discussed:  10/24/16  Additional Comments: 10/24/2016 Discussed in LOS 8/9 - pt remains apporpriate for continued stay.  [place]  Placed and pt has successfully tolerated HD.  Per nephrology clipping process has began.  Therapy recommending HH - CM will arrange   10/22/16 Pt will initiate  long term HD this admit with [facility] being placed today. Heart Cath planned for tomorrow for CAD. Maryclare Labrador, RN 10/24/2016, 8:35 AM

## 2016-10-24 NOTE — Progress Notes (Signed)
HD tx initiated via HD Cath w/o problem, pull/push/flush equally w/o problem, VSS, will cont to monitor

## 2016-10-24 NOTE — Progress Notes (Signed)
   VASCULAR SURGERY ASSESSMENT & PLAN:   ESRD: The patient has a functioning right IJ Diatek. He looks to be a reasonable candidate for a Left BC AVF. However, he would be at moderate increased risk for cardiac complications given his findings on heart cath as noted by Dr. Sallyanne Kuster. He has also had a recent GI bleed and on colonoscopy had 2 angiodysplastic lesions. He also had 7-10 polyps removed. Thus there is some increased risk with periop anticoagulation. Despite these risks I do not think that it would be unreasonable to attempt a L BC AVF. However, after discussion these risks with the patient, he would prefer to wait before placing the AVF. He understands that there is some risk of infection with having the catheter longer.   OK to start Plavix and ASA from Vascular standpoint. Dr. Sallyanne Kuster noted that he wanted to see if he tolerated this without bleeding given GI issues.   I can arrange for a L BC AVF placement after his PCI when OK with Cardiology. He could be on ASA/ Plavix for this from my standpoint.   SUBJECTIVE:   No complaints.   PHYSICAL EXAM:   Vitals:   10/24/16 0322 10/24/16 0719 10/24/16 0800 10/24/16 1226  BP: (!) 156/65 (!) 169/56 (!) 143/47 (!) 144/59  Pulse: (!) 57 (!) 55 (!) 56 (!) 54  Resp: 16 (!) 22 (!) 9 (!) 23  Temp: 98.9 F (37.2 C) 98.6 F (37 C)    TempSrc: Oral Oral    SpO2: 97% 100% 98% 100%  Weight:      Height:       Palpable left radial pulse.  Catheter site looks fine.   LABS:   Lab Results  Component Value Date   WBC 7.3 10/24/2016   HGB 9.0 (L) 10/24/2016   HCT 29.3 (L) 10/24/2016   MCV 90.4 10/24/2016   PLT 186 10/24/2016   Lab Results  Component Value Date   CREATININE 4.40 (H) 10/24/2016   Lab Results  Component Value Date   INR 1.03 10/23/2016   CBG (last 3)   Recent Labs  10/23/16 0743  GLUCAP 82    PROBLEM LIST:    Principal Problem:   Elevated troponin Active Problems:   Chest pain   ARF (acute renal  failure) (HCC)   Hyperkalemia   Blood loss anemia   Demand ischemia (HCC)   Lower GI bleed   Tobacco abuse   CKD (chronic kidney disease) stage 5, GFR less than 15 ml/min (HCC)   Melena   Angiodysplasia of cecum   Benign neoplasm of cecum   Benign neoplasm of ascending colon   Benign neoplasm of descending colon   CURRENT MEDS:   . amLODipine  10 mg Oral Daily  . atorvastatin  80 mg Oral q1800  . carvedilol  3.125 mg Oral BID WC  . darbepoetin (ARANESP) injection - NON-DIALYSIS  150 mcg Subcutaneous Q Mon-1800  . febuxostat  80 mg Oral Daily  . hydrALAZINE  50 mg Oral Q8H  . multivitamin  1 tablet Oral QHS  . ondansetron (ZOFRAN) IV  4 mg Intravenous Once  . pantoprazole  40 mg Oral QAC breakfast  . sodium chloride flush  3 mL Intravenous Q12H    Gae Gallop Beeper: [phone] Office: [phone] 10/24/2016

## 2016-10-24 NOTE — Progress Notes (Signed)
Progress Note  Patient Name: Roger [name] Date of Encounter: 10/24/2016  Primary Cardiologist: Oval Linsey  Subjective   Denies angina or dyspnea at rest.  No complications at cardiac cath site. HD catheter site appears healthy. Heart rate in the 71G, systolic blood pressure 626R, but diastolic blood pressure in the 40-45 range. Angiography demonstrated chronic total occlusion of the right coronary artery and left circumflex coronary artery and a 70% stenosis in the mid LAD artery that does not have features suggesting vulnerable plaque.  Inpatient Medications    Scheduled Meds: . amLODipine  10 mg Oral Daily  . atorvastatin  80 mg Oral q1800  . carvedilol  3.125 mg Oral BID WC  . darbepoetin (ARANESP) injection - NON-DIALYSIS  150 mcg Subcutaneous Q Mon-1800  . febuxostat  80 mg Oral Daily  . hydrALAZINE  50 mg Oral Q8H  . multivitamin  1 tablet Oral QHS  . ondansetron (ZOFRAN) IV  4 mg Intravenous Once  . pantoprazole  40 mg Oral QAC breakfast  . sodium chloride flush  3 mL Intravenous Q12H   Continuous Infusions: . sodium chloride Stopped (10/23/16 0359)  . sodium chloride    . ferric gluconate (FERRLECIT/NULECIT) IV Stopped (10/23/16 1100)   PRN Meds: sodium chloride, acetaminophen **OR** acetaminophen, hydrALAZINE, sodium chloride flush   Vital Signs    Vitals:   10/23/16 2352 10/24/16 0322 10/24/16 0719 10/24/16 0800  BP:  (!) 156/65 (!) 169/56 (!) 143/47  Pulse: (!) 57 (!) 57 (!) 55 (!) 56  Resp: 10 16 (!) 22 (!) 9  Temp: 99.2 F (37.3 C) 98.9 F (37.2 C) 98.6 F (37 C)   TempSrc: Oral Oral Oral   SpO2: 96% 97% 100% 98%  Weight:      Height:        Intake/Output Summary (Last 24 hours) at 10/24/16 1127 Last data filed at 10/23/16 1200  Gross per 24 hour  Intake            43.33 ml  Output                0 ml  Net            43.33 ml   Filed Weights   10/22/16 2352 10/23/16 0300 10/23/16 0401  Weight: 205 lb 0.4 oz (93 kg) 205 lb 0.4 oz (93 kg) 203  lb 14.8 oz (92.5 kg)    Telemetry    Sinus bradycardia - Personally Reviewed  ECG    No new tracing - Personally Reviewed  Physical Exam  Appears relaxed and comfortable, as always he doesn't speak much. GEN: No acute distress.   Neck: No JVD Cardiac: RRR, no murmurs, rubs, or gallops. Healthy right groin sites, without hematoma or bleeding. Right IJ tunneled catheter site also appears to be healing well Respiratory: Clear to auscultation bilaterally. GI: Soft, nontender, non-distended  MS: No edema; No deformity. Neuro:  Nonfocal  Psych: Normal affect   Labs    Chemistry Recent Labs Lab 10/19/16 1058 10/20/16 0700 10/21/16 0607 10/22/16 0229 10/23/16 0613 10/24/16 0303  NA 139 138 142 141  140 140 141  K 6.0* 5.1 5.1 4.7  4.7 4.1 4.3  CL 115* 114* 117* 117*  117* 111 112*  CO2 18* 15* 16* 14*  15* 19* 19*  GLUCOSE 91 88 93 95  97 84 98  BUN 45* 38* 31* 28*  29* 17 19  CREATININE 5.38* 5.21* 5.09* 4.86*  4.86* 3.66* 4.40*  CALCIUM  8.4* 8.0* 7.8* 7.9*  7.9* 7.7* 7.8*  PROT 6.9 6.6 6.1*  --   --   --   ALBUMIN 3.7 3.5 3.3* 3.2* 3.0* 3.1*  AST 21 22 19   --   --   --   ALT 11* 11* 9*  --   --   --   ALKPHOS 122 125 117  --   --   --   BILITOT 0.5 0.8 0.7  --   --   --   GFRNONAA 9* 10* 10* 11*  11* 15* 12*  GFRAA 11* 11* 12* 12*  12* 18* 14*  ANIONGAP 6 9 9 10  8 10 10      Hematology Recent Labs Lab 10/22/16 0229 10/23/16 0613 10/24/16 0303  WBC 7.7 7.2 7.3  RBC 2.62* 2.99* 3.24*  HGB 7.6* 8.6* 9.0*  HCT 23.5* 26.6* 29.3*  MCV 89.7 89.0 90.4  MCH 29.0 28.8 27.8  MCHC 32.3 32.3 30.7  RDW 17.2* 16.4* 16.5*  PLT 195 165 186    Cardiac Enzymes Recent Labs Lab 10/19/16 0636 10/19/16 1058 10/19/16 1559  TROPONINI 2.68* 3.40* 2.98*    Recent Labs Lab 10/18/16 2114  TROPIPOC 2.31*     BNPNo results for input(s): BNP, PROBNP in the last 168 hours.   DDimer No results for input(s): DDIMER in the last 168 hours.   Radiology    Dg  Chest Port 1 View  Result Date: 10/22/2016 CLINICAL DATA:  Dialysis catheter insertion. EXAM: PORTABLE CHEST 1 VIEW COMPARISON:  10/18/2016 FINDINGS: Right jugular dialysis catheter has been placed. Catheter tip in the region of the right atrium. Hazy densities in the central aspects of the chest compatible with mild edema. Negative for a pneumothorax. Heart size is within normal limits. Atherosclerotic calcifications at the aortic arch. IMPRESSION: Dialysis catheter tip in the upper right atrium region. Negative for pneumothorax. Central vascular congestion or mild edema. Electronically Signed   By: Markus Daft M.D.   On: 10/22/2016 12:56   Dg Fluoro Guide Cv Line-no Report  Result Date: 10/22/2016 Fluoroscopy was utilized by the requesting physician.  No radiographic interpretation.    Cardiac Studies  ECHO 10/20/2016 - Left ventricle: The cavity size was normal. There was mild focalbasal hypertrophy of the septum. Systolic function was normal.The estimated ejection fraction was in the range of 55% to 60%.Wall motion was normal; there were no regional wall motionabnormalities. Features are consistent with a pseudonormal leftventricular filling pattern, with concomitant abnormal relaxationand increased filling pressure (grade 2 diastolic dysfunction).Doppler parameters are consistent with high ventricular fillingpressure. - Aortic valve: Transvalvular velocity was within the normal range.There was no stenosis. There was no regurgitation. - Mitral valve: Transvalvular velocity was within the normal range.There was no evidence for stenosis. There was trivialregurgitation. - Left atrium: The atrium was mildly dilated. - Right ventricle: The cavity size was normal. Wall thickness wasnormal. Systolic function was normal. - Tricuspid valve: There was no regurgitation.  Cardiac cath 10/23/2016  Ost Cx to Prox Cx lesion, 100 %stenosed.  Prox RCA lesion, 100 %stenosed.  Prox LAD lesion, 40  %stenosed.  Mid LAD lesion, 70 %stenosed.  Ost 1st Diag to 1st Diag lesion, 70 %stenosed.  Ost 2nd Diag to 2nd Diag lesion, 80 %stenosed.  Mid RCA to Dist RCA lesion, 100 %stenosed.   1. Significant three-vessel coronary artery disease. Chronically occluded right coronary artery with left to right collaterals supplying the right PDA. Chronically occluded proximal left circumflex which seems to be a small vessel  overall with some left to left collaterals. Most of the lateral wall seems to be supplied by a large ramus branch. The LAD has a discrete 70% stenosis in a tortuous midsegment . The coronary arteries are overall moderately calcified and extremely tortuous likely due to hypertensive heart disease.  2. Moderately elevated left ventricular end-diastolic pressure. Left ventricular angiography was not performed due to kidney disease. EF was normal by echo.  Recommendations: I don't think there woud be significant advantage from CABG given that the disease in the LAD does not seem to be critical and the only other graftable vessel would be the right PDA. The OMs seem to be very small and diffusely diseased. The best option is probably to treat medically for now and consider mid LAD PCI in the near future (few months) depending on his clinical course and after making sure he recovers from current illnesses including GI bleed and renal failure.   Diagnostic Diagram          Patient Profile     76 y.o. male presenting with small non-STEMI in the setting of acute GI bleed and severe anemia (angiodysplasia of the colon, chronic iron deficiency, newly declared end-stage renal disease) found to have severe multivessel coronary artery disease with total occlusion of the right coronary artery and left circumflex coronary artery and a high-grade stenosis in the mid LAD artery, but with preserved left ventricular systolic function by echo  Assessment & Plan    1. CAD/NSTEMI: Chronic total  occlusion of the right coronary artery and nondominant left circumflex coronary artery, with a patent large ramus intermedius artery feeding most of the lateral wall and a 70% stenosis in the mid LAD artery which provides collateral flow to the inferior wall. He is a very poor candidate for bypass surgery due to poor rehabilitation potential, multiple comorbid conditions and limited options for revascularization (no good targets in the left circumflex territory). Best option for revascularization will probably be PCI-stent to the mid LAD artery. This would also be a high risk procedure due to the same considerations, but would be better tolerated. Since the lesion in the LAD is relatively short and the vessel is large in caliber, a bare-metal stent would likely be the best choice in this patient without diabetes who is at substantial risk for recurrent GI bleeding. Using a bare-metal stent would also have the advantage that would allow future surgical procedures to be performed with less delay.  Would like to start aspirin and clopidogrel soon, to make sure that he will be able to tolerate dual antiplatelet therapy without bleeding. Would then plan to perform LAD revascularization in about 4-6 weeks. If a bare-metal stent is used, he will then have to continue aspirin and clopidogrel for 30 days without interruption. 2. PAD with stable intermittent claudication. 3. Smoking, cessation advised. 4. Anemia/recent GI bleeding:  Colonic angiodysplasia treated, EGD negative. Stable hemoglobin since transfusion. No evidence of active bleeding. 5. LFY:BOFB elevated SBP, but at times has markedly decreased diastolic BP. Persistent bradycardia. This limits the use of beta blockers. 6. Preop risk evaluation: With our current knowledge of his coronary anatomy and the recent small non-STEMI due to demand ischemia, his risk of major cardiovascular complications with placement of an AV fistula is at least moderate. The  risk is not prohibitive, but from a cardiovascular point of view the safest approach would be to perform revascularization of the LAD artery, followed by 1 month of dual antiplatelet therapy (for a bare-metal stent),  after which the AV fistula can be surgically placed. Unfortunately, this will delay permanent dialysis access availability for a total of about 4-5 months. Will discuss with Dr. Lorrene Reid and Dr. Scot Dock.   All these considerations were reviewed in detail with Mr. Somerville (who didn't talk much), also with his daughter who participated much more attentively in the conversation. She is involved in clinical research and appears to have a good understanding of these complicated medical issues.   Signed, Sanda Klein, MD  10/24/2016, 11:27 AM

## 2016-10-24 NOTE — Progress Notes (Signed)
PROGRESS NOTE  CHOU BUSLER VOZ:[id number] DOB: 02/12/1941 DOA: 10/18/2016 PCP: [name], MD  HPI/Recap of past 24 hours:  Had cardiac cath yesterday He is to get dialysis today,   He does not talk much, but when prompted, he replies had watery diarrhea 4-5 episodes last night, he denies ab pain, no n/v, no fever,   daughter at bedside   Principal Problem:   Elevated troponin Active Problems:   Chest pain   ARF (acute renal failure) (HCC)   Hyperkalemia   Blood loss anemia   Demand ischemia (HCC)   Lower GI bleed   Tobacco abuse   CKD (chronic kidney disease) stage 5, GFR less than 15 ml/min (HCC)   Melena   Angiodysplasia of cecum   Benign neoplasm of cecum   Benign neoplasm of ascending colon   Benign neoplasm of descending colon   Acute GI bleed with acute blood loss anemia: resolved ,Status post transfusion of 2  units  packed RBCs ,  hemoglobin around 8 stable ,EGD normal /Colonoscopy showed angiodysplasia with diverticulosis with no apparent bleeding , he has polypectomy during colonoscopy, path showed tubular adenomas  no more apparent GI bleed, he was on  PPI drip, now on po ppi, adavnce the diet . Per GI Dr Carlean Purl on 8/6:  "he is at higher risk of more bleeding problems so if a bare metal stent and shorter-term anti-PLT (clopidoigrel/ticagrelor) Tx I think would be better." On review of his home meds, he is on prednisone prior to admission. Cardiology to decide on when to start on antiplatelet.  -he reported diarrhea , no blood on 8/9, will check for c diff  Type 2 non-ST elevation MI in the setting of supply demand mismatch ( WITH UNDERLYING SEVER CAD) in the setting of anemia/GI bleed chest pain-free for now, troponin peaked at 3.4 trending down.  continue beta blocker , hold aspirin or any anticoagulation in the setting of GI bleed, stress test showed large reversible ischemia ,  cath on 8/8 after first  HD on 8/7 . Plan to start asa/plavix soon  To see  if patient can tolerate without gi bleed, then proceed with revascularization in 4-6 weeks, bare metal stent if preferred, then plan to continue asa/plavix for 30dasy post stenting, will follow cardiology recommendations.    AKI/CKD IVwith chronic related anemia/now ESRD started on HD on 8/7 : Progressive CKD , Related to dehydration and anemia, complicated with hyperkalemia (k 6 on admission, resolved), right sided dialysis catheter placed and HD initiated on 8/7, patient prefer to have permanent dialysis access placement after cardiac revascularization, vascular surgery input appreciated.  will follow nephrology recommendations.  HTN : Continue coreg/Norvasc , Hydralazine increased , BP will get better after HD.  H/o gout , he has been on uloric ( will need to discuss with patient/nephrology/cardiology about switch to allopurinol due to concerns of cardiac side effect reported on uloric.)  Hx of PAD with stable bilateral lower extremity claudication, he is followed by vascular surgery  Dr Scot Dock in the past ( I have personally reviewed outpatient notes from vascular surgery)  H/o Prostate cancer s/p seeds implants  H/o AFib? Has been sinus rhythm since admission ( I personally review  tele strip  Daily)  Cigarettes smoking: smoking cessation education provided at length   Body mass index is 31.94 kg/m.   Code Status: full, comfirmed  Family Communication: patient and daughter at bedside  Disposition Plan: remain in stepdown, need clearance from nephrology and  cardiology for discharge   Consultants:  GI   Cardiology  Vascular surgery  Procedures:  Tunneled catheter placement for HD on 8/7  Egd/colonoscopy  Stress test   cardiac cath on 8/8  HD started on 8/7  prbc transfusion  Antibiotics:  none   Objective: BP (!) 169/56 (BP Location: Right Arm)   Pulse (!) 55   Temp 98.6 F (37 C) (Oral)   Resp (!) 22   Ht 5\' 7"  (1.702 m)   Wt 92.5 kg (203 lb 14.8  oz)   SpO2 100%   BMI 31.94 kg/m   Intake/Output Summary (Last 24 hours) at 10/24/16 0740 Last data filed at 10/23/16 1200  Gross per 24 hour  Intake           163.33 ml  Output                0 ml  Net           163.33 ml   Filed Weights   10/22/16 2352 10/23/16 0300 10/23/16 0401  Weight: 93 kg (205 lb 0.4 oz) 93 kg (205 lb 0.4 oz) 92.5 kg (203 lb 14.8 oz)    Exam: Patient is examined daily including today on 10/24/2016, exam remains the same as of yesterday except that is highlighted below   General:  NAD, poor dentition , right sided dialysis catheter in place  Cardiovascular: RRR  Respiratory: CTABL  Abdomen: Soft/ND/NT, positive BS  Musculoskeletal: No Edema  Neuro: aaox3  Data Reviewed: Basic Metabolic Panel:  Recent Labs Lab 10/19/16 0636  10/20/16 0700 10/21/16 0607 10/22/16 0229 10/23/16 0613 10/24/16 0303  NA  --   < > 138 142 141  140 140 141  K  --   < > 5.1 5.1 4.7  4.7 4.1 4.3  CL  --   < > 114* 117* 117*  117* 111 112*  CO2  --   < > 15* 16* 14*  15* 19* 19*  GLUCOSE  --   < > 88 93 95  97 84 98  BUN  --   < > 38* 31* 28*  29* 17 19  CREATININE  --   < > 5.21* 5.09* 4.86*  4.86* 3.66* 4.40*  CALCIUM  --   < > 8.0* 7.8* 7.9*  7.9* 7.7* 7.8*  MG 2.0  --   --   --   --   --   --   PHOS 4.2  --   --   --  4.0 3.0 3.0  < > = values in this interval not displayed. Liver Function Tests:  Recent Labs Lab 10/19/16 1058 10/20/16 0700 10/21/16 0607 10/22/16 0229 10/23/16 0613 10/24/16 0303  AST 21 22 19   --   --   --   ALT 11* 11* 9*  --   --   --   ALKPHOS 122 125 117  --   --   --   BILITOT 0.5 0.8 0.7  --   --   --   PROT 6.9 6.6 6.1*  --   --   --   ALBUMIN 3.7 3.5 3.3* 3.2* 3.0* 3.1*   No results for input(s): LIPASE, AMYLASE in the last 168 hours. No results for input(s): AMMONIA in the last 168 hours. CBC:  Recent Labs Lab 10/20/16 0700 10/21/16 0607 10/22/16 0229 10/23/16 0613 10/24/16 0303  WBC 8.1 6.5 7.7 7.2 7.3    HGB 8.2* 7.6* 7.6* 8.6* 9.0*  HCT 25.8*  23.9* 23.5* 26.6* 29.3*  MCV 89.6 89.8 89.7 89.0 90.4  PLT 210 198 195 165 186   Cardiac Enzymes:    Recent Labs Lab 10/19/16 0636 10/19/16 1058 10/19/16 1559  TROPONINI 2.68* 3.40* 2.98*   BNP (last 3 results) No results for input(s): BNP in the last 8760 hours.  ProBNP (last 3 results) No results for input(s): PROBNP in the last 8760 hours.  CBG:  Recent Labs Lab 10/23/16 0743  GLUCAP 82    Recent Results (from the past 240 hour(s))  MRSA PCR Screening     Status: None   Collection Time: 10/19/16  5:20 AM  Result Value Ref Range Status   MRSA by PCR NEGATIVE NEGATIVE Final    Comment:        The GeneXpert MRSA Assay (FDA approved for NASAL specimens only), is one component of a comprehensive MRSA colonization surveillance program. It is not intended to diagnose MRSA infection nor to guide or monitor treatment for MRSA infections.      Studies: No results found.  Scheduled Meds: . amLODipine  10 mg Oral Daily  . atorvastatin  80 mg Oral q1800  . carvedilol  3.125 mg Oral BID WC  . darbepoetin (ARANESP) injection - NON-DIALYSIS  150 mcg Subcutaneous Q Mon-1800  . febuxostat  80 mg Oral Daily  . hydrALAZINE  50 mg Oral Q8H  . multivitamin  1 tablet Oral QHS  . ondansetron (ZOFRAN) IV  4 mg Intravenous Once  . pantoprazole  40 mg Oral QAC breakfast  . sodium chloride flush  3 mL Intravenous Q12H    Continuous Infusions: . sodium chloride Stopped (10/23/16 0359)  . sodium chloride    . ferric gluconate (FERRLECIT/NULECIT) IV Stopped (10/23/16 1100)     Time spent: 92mins I have personally reviewed and interpreted daily labs, tele strips, as discussed above under data review session and assessment and plans. Case discussed with nephrology in person,  Plan of care explained to patient and daughter at length, patient prefer to go home with home health when ready to be discharged.  Brittannie Tawney MD, PhD  [facility] Pager [phone]. If 7PM-7AM, please contact night-coverage at [email or web address], password [facility] 10/24/2016, 7:40 AM  LOS: 6 days

## 2016-10-24 NOTE — Progress Notes (Addendum)
[facility]  Subjective/interim history:  TDC placed 8/7 R IJ (Dr. Oneida Alar) Had 1st HD 8/7 Had cath 8/8 results and recommendations noted  Pt has had 5 watery loose YELLOW stools since 3 AM Cleaned up several times No nausea or vomiting  Vitals:   10/23/16 2352 10/24/16 0322 10/24/16 0719 10/24/16 0800  BP:  (!) 156/65 (!) 169/56 (!) 143/47  Pulse: (!) 57 (!) 57 (!) 55 (!) 56  Resp: 10 16 (!) 22 (!) 9  Temp: 99.2 F (37.3 C) 98.9 F (37.2 C) 98.6 F (37 C)   TempSrc: Oral Oral Oral   SpO2: 96% 97% 100% 98%  Weight:      Height:       Exam: Older AAM, very nice VS as noted Sitting on edge of bed Complains of loose watery diarrhea Poor dentition No JVD Lungs clear S1S2 No S3 Abd obese, soft, not tender No LE edema, skin dry and ashy R sided TDC with dry dressing in place (8/7)  Inpatient medications: . amLODipine  10 mg Oral Daily  . atorvastatin  80 mg Oral q1800  . carvedilol  3.125 mg Oral BID WC  . darbepoetin (ARANESP) injection - NON-DIALYSIS  150 mcg Subcutaneous Q Mon-1800  . febuxostat  80 mg Oral Daily  . hydrALAZINE  50 mg Oral Q8H  . multivitamin  1 tablet Oral QHS  . ondansetron (ZOFRAN) IV  4 mg Intravenous Once  . pantoprazole  40 mg Oral QAC breakfast  . sodium chloride flush  3 mL Intravenous Q12H   . sodium chloride Stopped (10/23/16 0359)  . sodium chloride    . ferric gluconate (FERRLECIT/NULECIT) IV Stopped (10/23/16 1100)   sodium chloride, acetaminophen **OR** acetaminophen, hydrALAZINE, sodium chloride flush  (Renal PTA history) Creat               Date                 eGFR 2012                2.7- 3.5 2013                1.95- 2.49 2014                2.42- 3.32        20- 25 10/18/16              5.74 10/19/16              5.38                 10       Recent Labs  10/23/16 0613 10/24/16 0303  WBC 7.2 7.3  HGB 8.6* 9.0*  HCT 26.6* 29.3*  MCV 89.0 90.4  PLT 165 186    Recent Labs  10/22/16 0229  FERRITIN  88  TIBC 242*  IRON 24*     Recent Labs Lab 10/22/16 0229 10/23/16 0613 10/24/16 0303  NA 141  140 140 141  K 4.7  4.7 4.1 4.3  CL 117*  117* 111 112*  CO2 14*  15* 19* 19*  GLUCOSE 95  97 84 98  BUN 28*  29* 17 19  CREATININE 4.86*  4.86* 3.66* 4.40*  CALCIUM 7.9*  7.9* 7.7* 7.8*  PHOS 4.0 3.0 3.0    Recent Labs Lab 10/19/16 1058 10/20/16 0700 10/21/16 0607 10/22/16 0229 10/23/16 0613 10/24/16 0303  AST '21 22 19  ' --   --   --  ALT 11* 11* 9*  --   --   --   ALKPHOS 122 125 117  --   --   --   BILITOT 0.5 0.8 0.7  --   --   --   PROT 6.9 6.6 6.1*  --   --   --   ALBUMIN 3.7 3.5 3.3* 3.2* 3.0* 3.1*    Lab Results  Component Value Date   PTH 212 (H) 10/22/2016   CALCIUM 7.8 (L) 10/24/2016   PHOS 3.0 10/24/2016       Component Value Date/Time   IRON 24 (L) 10/22/2016 0229   TIBC 242 (L) 10/22/2016 0229   FERRITIN 88 10/22/2016 0229   IRONPCTSAT 10 (L) 10/22/2016 0229   Cardiac cath report 10/23/2016 1. Significant three-vessel coronary artery disease. Chronically occluded right coronary artery with left to right collaterals supplying the right PDA. Chronically occluded proximal left circumflex which seems to be a small vessel overall with some left to left collaterals. Most of the lateral wall seems to be supplied by a large ramus branch. The LAD has a discrete 70% stenosis in a tortuous midsegment . The coronary arteries are overall moderately calcified and extremely tortuous likely due to hypertensive heart disease. 2. Moderately elevated left ventricular end-diastolic pressure. Left ventricular angiography was not performed due to kidney disease. EF was normal by echo.  Background: 76 y.o. year-old with history of CKD stage 4, PVD, anemia , HTN and gout who presented to the ED 10/18/16 with c/o acid reflux and belching after eating, not sleeping because of it, some vomiting. In ED Hb 6.2/chest pain, trop ^'d, bloody stools. Creatinine 5.74 GFR 10. Stopped  all f/u with CKD (DR. Sanford) a year ago. Uremic sx on admission. Opted for HD. 1st HD 8/7. New ESRD   Impression/Recommendations:  1. New ESRD.  1. Opted for HD.  2. TDC placed 8/7,  3. HD #1 8/7. Will have HD #2 8/9 no heparin 4. Permanent access postponed d/t need for heart cath.  NO immediate intervention planned. Will see if [facility] willing proceed at this time (they are aware of him) 5. CLIP process initiated. NO outpt assignment yet 2. CAD   1. Nuclear stress test large size, severe intensity reversible anterior, apical and inferolateral perfusion defect suggestive of ischemia.  2. LVEF 40% with inferolateral wall hypokinesis.   3. Heart cath results as noted w/3V ds 4. No benefit to CABG per Dr. Fletcher Anon 5. Possible PCI in future once GIB issues resolved 3. Rectal bleeding/ anemia - EGD neg. Colonoscopy angiodysplasia/tics. No active bleeding now.  Hb stable. 1. S/p total 3 U blood this admission 2. Started Aranesp 200/week 8/6.  3. Repleting Fe 250 IV QD X 4 for low Tsat of 10%. First dose given 8/8. 4. HTN - hydralazine/coreg/amlodipine 5. AFib  6. Hx DVT 7. PAD hx claudication 8. Hx prostate cancer rx seed implants 9. Gout 10. Bones -  PTH 212. In goal range for CKD5. Ca/phos OK. No binder needed at this time.  Jamal Maes, MD [facility] [phone] Pager 10/24/2016, 8:55 AM

## 2016-10-25 ENCOUNTER — Telehealth: Payer: Self-pay | Admitting: Cardiovascular Disease

## 2016-10-25 LAB — CBC
HCT: 28.9 % — ABNORMAL LOW (ref 39.0–52.0)
Hemoglobin: 9.2 g/dL — ABNORMAL LOW (ref 13.0–17.0)
MCH: 28.5 pg (ref 26.0–34.0)
MCHC: 31.8 g/dL (ref 30.0–36.0)
MCV: 89.5 fL (ref 78.0–100.0)
PLATELETS: 174 10*3/uL (ref 150–400)
RBC: 3.23 MIL/uL — ABNORMAL LOW (ref 4.22–5.81)
RDW: 16.1 % — AB (ref 11.5–15.5)
WBC: 8.5 10*3/uL (ref 4.0–10.5)

## 2016-10-25 LAB — RENAL FUNCTION PANEL
Albumin: 3.4 g/dL — ABNORMAL LOW (ref 3.5–5.0)
Anion gap: 10 (ref 5–15)
BUN: 8 mg/dL (ref 6–20)
CO2: 25 mmol/L (ref 22–32)
Calcium: 8 mg/dL — ABNORMAL LOW (ref 8.9–10.3)
Chloride: 103 mmol/L (ref 101–111)
Creatinine, Ser: 2.81 mg/dL — ABNORMAL HIGH (ref 0.61–1.24)
GFR calc Af Amer: 24 mL/min — ABNORMAL LOW (ref >60)
GFR calc non Af Amer: 21 mL/min — ABNORMAL LOW (ref >60)
Glucose, Bld: 93 mg/dL (ref 65–99)
Phosphorus: 2.4 mg/dL — ABNORMAL LOW (ref 2.5–4.6)
Potassium: 3.6 mmol/L (ref 3.5–5.1)
Sodium: 138 mmol/L (ref 135–145)

## 2016-10-25 MED ORDER — ALLOPURINOL 100 MG PO TABS: 100.0000 mg | ORAL_TABLET | ORAL | Status: DC

## 2016-10-25 MED ORDER — ASPIRIN 81 MG PO CHEW
81.0000 mg | CHEWABLE_TABLET | Freq: Once | ORAL | Status: AC
  Administered 2016-10-25: 81 mg via ORAL
  Filled 2016-10-25: qty 1

## 2016-10-25 MED ORDER — CLOPIDOGREL BISULFATE 75 MG PO TABS
75.0000 mg | ORAL_TABLET | Freq: Every day | ORAL | Status: DC
  Administered 2016-10-25 – 2016-10-26 (×2): 75 mg via ORAL
  Filled 2016-10-25 (×2): qty 1

## 2016-10-25 MED ORDER — ALLOPURINOL 100 MG PO TABS
100.0000 mg | ORAL_TABLET | Freq: Every day | ORAL | Status: DC
  Administered 2016-10-25 – 2016-10-26 (×2): 100 mg via ORAL
  Filled 2016-10-25 (×2): qty 1

## 2016-10-25 NOTE — Progress Notes (Signed)
Physical Therapy Treatment Patient Details Name: Roger [name] MRN: [id number] DOB: 1940-07-26 Today's Date: 10/25/2016    History of Present Illness pt is a 76 y/o male with pmh significant for CKD stage 4/5, PVD, p. afib, anemia, HTN, admitted with c/o N/V with bloody emesis, diaphoresis, burning dhest pain.  Work up found acute GI bleed, NSTEMI, AKI.  pt s/p Endo, s/p cardiac cath. Colonoscopy and HD catheter in R internal jugular.    PT Comments    Patient progressing well towards PT goals. Tolerated gait training with some encouragement. Sitting BP pre activity 138/59, post activity BP 127/66 but no symptoms of dizziness despite pt reporting this is the lowest he has ever seen his BP. Pt seems to have poor awareness of deficits/medical condition and continually asking for w/c. Education on importance of mobility and walking. Will continue to follow and progress as tolerated.   Follow Up Recommendations  Home health PT;Supervision for mobility/OOB     Equipment Recommendations  Other (comment) (shower chair)    Recommendations for Other Services       Precautions / Restrictions Precautions Precautions: Fall Restrictions Weight Bearing Restrictions: No    Mobility  Bed Mobility Overal bed mobility: Needs Assistance             General bed mobility comments: leaning on left forearm in bed, able to get to upright without assist.   Transfers Overall transfer level: Needs assistance Equipment used: Rolling walker (2 wheeled) Transfers: Sit to/from Stand Sit to Stand: Min guard         General transfer comment: Min guard for safety. Stood from EOB without assist, cues for hand placement/technique.  Ambulation/Gait Ambulation/Gait assistance: Min guard Ambulation Distance (Feet): 220 Feet Assistive device: Rolling walker (2 wheeled) Gait Pattern/deviations: Step-through pattern;Decreased stride length Gait velocity: decreased Gait velocity interpretation: Below  normal speed for age/gender General Gait Details: Slow, mildly unsteady gait with 2 standing rest breaks. Right knee instability noted but no buckling. 2/4 DOE.    Stairs            Wheelchair Mobility    Modified Rankin (Stroke Patients Only)       Balance Overall balance assessment: Needs assistance Sitting-balance support: Feet supported;No upper extremity supported Sitting balance-Leahy Scale: Good     Standing balance support: During functional activity;Single extremity supported Standing balance-Leahy Scale: Fair Standing balance comment: Requires at least 1 UE support for standing.                             Cognition Arousal/Alertness: Awake/alert Behavior During Therapy: WFL for tasks assessed/performed Overall Cognitive Status: Within Functional Limits for tasks assessed                                 General Comments: Pt jokes around a lot- baseline personality. Seems to have poor insight into medical condition.      Exercises      General Comments General comments (skin integrity, edema, etc.): Daughter and family present during session. Sitting BP pre activity 138/59, post activity BP 127/66      Pertinent Vitals/Pain Pain Assessment: No/denies pain    Home Living                      Prior Function            PT Goals (current  goals can now be found in the care plan section) Progress towards PT goals: Progressing toward goals    Frequency    Min 3X/week      PT Plan Current plan remains appropriate    Co-evaluation              AM-PAC PT "6 Clicks" Daily Activity  Outcome Measure  Difficulty turning over in bed (including adjusting bedclothes, sheets and blankets)?: None Difficulty moving from lying on back to sitting on the side of the bed? : None Difficulty sitting down on and standing up from a chair with arms (e.g., wheelchair, bedside commode, etc,.)?: None Help needed moving to and  from a bed to chair (including a wheelchair)?: A Little Help needed walking in hospital room?: A Little Help needed climbing 3-5 steps with a railing? : A Little 6 Click Score: 21    End of Session Equipment Utilized During Treatment: Gait belt Activity Tolerance: Patient tolerated treatment well Patient left: in bed;with call bell/phone within reach;with family/visitor present;with bed alarm set Nurse Communication: Mobility status PT Visit Diagnosis: Difficulty in walking, not elsewhere classified (R26.2);Muscle weakness (generalized) (M62.81)     Time: 0964-3838 PT Time Calculation (min) (ACUTE ONLY): 26 min  Charges:  $Gait Training: 8-22 mins $Therapeutic Exercise: 8-22 mins                    G Codes:       Wray Kearns, PT, DPT [phone]     [place] 10/25/2016, 3:15 PM

## 2016-10-25 NOTE — Progress Notes (Addendum)
PROGRESS NOTE  Roger [name] GXQ:[id number] DOB: 03/16/1941 DOA: 10/18/2016 PCP: Roger Beard, MD  HPI/Recap of past 24 hours:  He is sitting up at the edge of the bed, denies pain, on room air, no sob, no cough, report diarrhea has stopped, denies blood in stool.  daughter at bedside   Principal Problem:   Elevated troponin Active Problems:   Chest pain   ARF (acute renal failure) (HCC)   Hyperkalemia   Blood loss anemia   Demand ischemia (HCC)   Lower GI bleed   Tobacco abuse   CKD (chronic kidney disease) stage 5, GFR less than 15 ml/min (HCC)   Melena   Angiodysplasia of cecum   Benign neoplasm of cecum   Benign neoplasm of ascending colon   Benign neoplasm of descending colon   Acute GI bleed with acute blood loss anemia: resolved ,Status post transfusion of 2  units  packed RBCs ,  hemoglobin around 8 stable ,EGD normal /Colonoscopy showed angiodysplasia with diverticulosis with no apparent bleeding , he has polypectomy during colonoscopy, path showed tubular adenomas  no more apparent GI bleed, he was on  PPI drip, now on po ppi, adavnce the diet . Per GI Dr Carlean Purl on 8/6:  "he is at higher risk of more bleeding problems so if a bare metal stent and shorter-term anti-PLT (clopidoigrel/ticagrelor) Tx I think would be better." On review of his home meds, he is on prednisone prior to admission. Cardiology to decide on when to start on antiplatelet.  -he reported diarrhea on 8/9 , no blood ,  c diff negative, diarrhea has stopped, he report h/o lactose intolerance.  Type 2 non-ST elevation MI in the setting of supply demand mismatch ( WITH UNDERLYING SEVER CAD) in the setting of anemia/GI bleed chest pain-free for now, troponin peaked at 3.4 trending down.  continue beta blocker , hold aspirin or any anticoagulation in the setting of GI bleed, stress test showed large reversible ischemia ,  cath on 8/8 after first  HD on 8/7 . Plan to start asa/plavix soon  To see if  patient can tolerate without gi bleed, then proceed with revascularization in 4-6 weeks, bare metal stent if preferred, then plan to continue asa/plavix for 30dasy post stenting, will follow cardiology recommendations.    AKI/CKD IVwith chronic related anemia/now ESRD started on HD on 8/7 : Progressive CKD , Related to dehydration and anemia, complicated with hyperkalemia (k 6 on admission, resolved), right sided dialysis catheter placed and HD initiated on 8/7, patient prefer to have permanent dialysis access placement after cardiac revascularization, vascular surgery input appreciated.  will follow nephrology recommendations.  HTN : Continue coreg/Norvasc , Hydralazine increased , BP will get better after HD.  H/o gout , he report had severe gout arthritis and see a gout specilist Dr Ouida Sills in town, he was started on uloric a few months ago, I have discussed cardiac side effect reported on uloric, patient and daughter agreed to switch to allopurinol 100mg  daily ( nephrology oked with daily dosing), will check uric acid, he is advised to follow up with Dr Ouida Sills for further management of gout.  Hx of PAD with stable bilateral lower extremity claudication, he is followed by vascular surgery  Dr Scot Dock in the past ( I have personally reviewed outpatient notes from vascular surgery)  H/o Prostate cancer s/p seeds implants  H/o AFib? Has been sinus rhythm since admission ( I personally reviewed and interpreted  tele strip  Daily)  Cigarettes  smoking: smoking cessation education provided at length   Body mass index is 31.01 kg/m.   Code Status: full, comfirmed  Family Communication: patient and daughter at bedside  Disposition Plan: getting close to be discharged, care manager notified to set up home health  need clearance from nephrology and cardiology for discharge   Consultants:  GI   Cardiology  nephrology  Vascular surgery  Procedures:  Tunneled catheter placement for  HD on 8/7  Egd/colonoscopy  Stress test   cardiac cath on 8/8  HD started on 8/7  prbc transfusion  Antibiotics:  none   Objective: BP (!) 138/118 (BP Location: Left Arm)   Pulse 64   Temp 99.4 F (37.4 C) (Oral)   Resp 16   Ht 5\' 7"  (1.702 m)   Wt 89.8 kg (198 lb)   SpO2 100%   BMI 31.01 kg/m   Intake/Output Summary (Last 24 hours) at 10/25/16 0927 Last data filed at 10/25/16 0505  Gross per 24 hour  Intake                3 ml  Output             1160 ml  Net            -1157 ml   Filed Weights   10/24/16 1938 10/24/16 2327 10/25/16 0407  Weight: 91.1 kg (200 lb 13.4 oz) 91 kg (200 lb 9.9 oz) 89.8 kg (198 lb)    Exam: Patient is examined daily including today on 10/25/2016, exam remains the same as of yesterday except that is highlighted below   General:  NAD, poor dentition , right sided dialysis catheter in place  Cardiovascular: RRR  Respiratory: CTABL  Abdomen: Soft/ND/NT, positive BS  Musculoskeletal: No Edema  Neuro: aaox3  Data Reviewed: Basic Metabolic Panel:  Recent Labs Lab 10/19/16 0636  10/22/16 0229 10/23/16 3875 10/24/16 0303 10/24/16 1955 10/25/16 0257  NA  --   < > 141  140 140 141 141 138  K  --   < > 4.7  4.7 4.1 4.3 4.4 3.6  CL  --   < > 117*  117* 111 112* 114* 103  CO2  --   < > 14*  15* 19* 19* 19* 25  GLUCOSE  --   < > 95  97 84 98 109* 93  BUN  --   < > 28*  29* 17 19 25* 8  CREATININE  --   < > 4.86*  4.86* 3.66* 4.40* 4.67* 2.81*  CALCIUM  --   < > 7.9*  7.9* 7.7* 7.8* 7.8* 8.0*  MG 2.0  --   --   --   --   --   --   PHOS 4.2  --  4.0 3.0 3.0 3.0 2.4*  < > = values in this interval not displayed. Liver Function Tests:  Recent Labs Lab 10/19/16 1058 10/20/16 0700 10/21/16 0607 10/22/16 0229 10/23/16 0613 10/24/16 0303 10/24/16 1955 10/25/16 0257  AST 21 22 19   --   --   --   --   --   ALT 11* 11* 9*  --   --   --   --   --   ALKPHOS 122 125 117  --   --   --   --   --   BILITOT 0.5 0.8 0.7   --   --   --   --   --   PROT 6.9 6.6  6.1*  --   --   --   --   --   ALBUMIN 3.7 3.5 3.3* 3.2* 3.0* 3.1* 3.3* 3.4*   No results for input(s): LIPASE, AMYLASE in the last 168 hours. No results for input(s): AMMONIA in the last 168 hours. CBC:  Recent Labs Lab 10/22/16 0229 10/23/16 0613 10/24/16 0303 10/24/16 1954 10/25/16 0257  WBC 7.7 7.2 7.3 7.9 8.5  HGB 7.6* 8.6* 9.0* 8.9* 9.2*  HCT 23.5* 26.6* 29.3* 27.9* 28.9*  MCV 89.7 89.0 90.4 90.9 89.5  PLT 195 165 186 177 174   Cardiac Enzymes:    Recent Labs Lab 10/19/16 0636 10/19/16 1058 10/19/16 1559  TROPONINI 2.68* 3.40* 2.98*   BNP (last 3 results) No results for input(s): BNP in the last 8760 hours.  ProBNP (last 3 results) No results for input(s): PROBNP in the last 8760 hours.  CBG:  Recent Labs Lab 10/23/16 0743  GLUCAP 82    Recent Results (from the past 240 hour(s))  MRSA PCR Screening     Status: None   Collection Time: 10/19/16  5:20 AM  Result Value Ref Range Status   MRSA by PCR NEGATIVE NEGATIVE Final    Comment:        The GeneXpert MRSA Assay (FDA approved for NASAL specimens only), is one component of a comprehensive MRSA colonization surveillance program. It is not intended to diagnose MRSA infection nor to guide or monitor treatment for MRSA infections.   C difficile quick scan w PCR reflex     Status: None   Collection Time: 10/24/16  1:18 AM  Result Value Ref Range Status   C Diff antigen NEGATIVE NEGATIVE Final   C Diff toxin NEGATIVE NEGATIVE Final   C Diff interpretation No C. difficile detected.  Final     Studies: No results found.  Scheduled Meds: . allopurinol  100 mg Oral QODAY  . amLODipine  10 mg Oral Daily  . atorvastatin  80 mg Oral q1800  . carvedilol  3.125 mg Oral BID WC  . darbepoetin (ARANESP) injection - NON-DIALYSIS  150 mcg Subcutaneous Q Mon-1800  . hydrALAZINE  50 mg Oral Q8H  . multivitamin  1 tablet Oral QHS  . pantoprazole  40 mg Oral QAC  breakfast  . sodium chloride flush  3 mL Intravenous Q12H    Continuous Infusions: . sodium chloride Stopped (10/23/16 0359)  . sodium chloride    . ferric gluconate (FERRLECIT/NULECIT) IV Stopped (10/24/16 1431)     Time spent: 8mins I have personally reviewed and interpreted daily labs, tele strips, as discussed above under data review session and assessment and plans. Case discussed with nephrology in person today. Plan of care explained to patient and daughter at Concha Pyo MD, PhD  [facility] Pager [phone]. If 7PM-7AM, please contact night-coverage at [email or web address], password [facility] 10/25/2016, 9:27 AM  LOS: 7 days

## 2016-10-25 NOTE — Telephone Encounter (Signed)
Currently admitted.

## 2016-10-25 NOTE — Progress Notes (Signed)
HD tx completed @ 2320 w/o problem, UF goal met, blood rinsed back, VSS, report called to Devra Dopp, RN

## 2016-10-25 NOTE — Progress Notes (Signed)
Progress Note  Patient Name: Roger [name] Date of Encounter: 10/25/2016  Primary Cardiologist: Summers/Arida  Subjective   No angina, no dyspnea. Reluctant to exercise with physical therapy, but did okay when he finally got up. No new GI bleeding. Reviewed options for further care with Dr. Scot Dock and Dr. Lorrene Reid, due to the complex situation with recent non-STEMI, recent GI bleeding and need for dialysis access. Competing interests regarding need to establish permanent dialysis access versus increased risk of acute ischemic complications or bleeding complications.  Inpatient Medications    Scheduled Meds: . allopurinol  100 mg Oral Daily  . amLODipine  10 mg Oral Daily  . aspirin  81 mg Oral Once  . atorvastatin  80 mg Oral q1800  . carvedilol  3.125 mg Oral BID WC  . clopidogrel  75 mg Oral Daily  . darbepoetin (ARANESP) injection - NON-DIALYSIS  150 mcg Subcutaneous Q Mon-1800  . hydrALAZINE  50 mg Oral Q8H  . multivitamin  1 tablet Oral QHS  . pantoprazole  40 mg Oral QAC breakfast  . sodium chloride flush  3 mL Intravenous Q12H   Continuous Infusions: . sodium chloride Stopped (10/23/16 0359)  . sodium chloride    . ferric gluconate (FERRLECIT/NULECIT) IV Stopped (10/25/16 1144)   PRN Meds: sodium chloride, acetaminophen **OR** acetaminophen, alteplase, heparin, hydrALAZINE, sodium chloride flush   Vital Signs    Vitals:   10/25/16 0102 10/25/16 0407 10/25/16 0824 10/25/16 1208  BP: (!) 181/67 (!) 168/56 (!) 138/118 (!) 148/63  Pulse:  68 64   Resp:  16    Temp:  99.5 F (37.5 C) 99.4 F (37.4 C) 99.4 F (37.4 C)  TempSrc:  Oral Oral Oral  SpO2:  99% 100%   Weight:  198 lb (89.8 kg)    Height:        Intake/Output Summary (Last 24 hours) at 10/25/16 1443 Last data filed at 10/25/16 0505  Gross per 24 hour  Intake                3 ml  Output             1160 ml  Net            -1157 ml   Filed Weights   10/24/16 1938 10/24/16 2327 10/25/16 0407    Weight: 200 lb 13.4 oz (91.1 kg) 200 lb 9.9 oz (91 kg) 198 lb (89.8 kg)    Telemetry    Normal sinus rhythm - Personally Reviewed  ECG    No new tracing - Personally Reviewed  Physical Exam  Alert, comfortable and relaxed GEN: No acute distress.   Neck: No JVD Cardiac: RRR, no murmurs, rubs, or gallops.  Respiratory: Clear to auscultation bilaterally. GI: Soft, nontender, non-distended  MS: No edema; No deformity. Neuro:  Nonfocal  Psych: Normal affect   Labs    Chemistry Recent Labs Lab 10/19/16 1058 10/20/16 0700 10/21/16 0607  10/24/16 0303 10/24/16 1955 10/25/16 0257  NA 139 138 142  < > 141 141 138  K 6.0* 5.1 5.1  < > 4.3 4.4 3.6  CL 115* 114* 117*  < > 112* 114* 103  CO2 18* 15* 16*  < > 19* 19* 25  GLUCOSE 91 88 93  < > 98 109* 93  BUN 45* 38* 31*  < > 19 25* 8  CREATININE 5.38* 5.21* 5.09*  < > 4.40* 4.67* 2.81*  CALCIUM 8.4* 8.0* 7.8*  < > 7.8* 7.8*  8.0*  PROT 6.9 6.6 6.1*  --   --   --   --   ALBUMIN 3.7 3.5 3.3*  < > 3.1* 3.3* 3.4*  AST 21 22 19   --   --   --   --   ALT 11* 11* 9*  --   --   --   --   ALKPHOS 122 125 117  --   --   --   --   BILITOT 0.5 0.8 0.7  --   --   --   --   GFRNONAA 9* 10* 10*  < > 12* 11* 21*  GFRAA 11* 11* 12*  < > 14* 13* 24*  ANIONGAP 6 9 9   < > 10 8 10   < > = values in this interval not displayed.   Hematology Recent Labs Lab 10/24/16 0303 10/24/16 1954 10/25/16 0257  WBC 7.3 7.9 8.5  RBC 3.24* 3.07* 3.23*  HGB 9.0* 8.9* 9.2*  HCT 29.3* 27.9* 28.9*  MCV 90.4 90.9 89.5  MCH 27.8 29.0 28.5  MCHC 30.7 31.9 31.8  RDW 16.5* 16.5* 16.1*  PLT 186 177 174    Cardiac Enzymes Recent Labs Lab 10/19/16 0636 10/19/16 1058 10/19/16 1559  TROPONINI 2.68* 3.40* 2.98*    Recent Labs Lab 10/18/16 2114  TROPIPOC 2.31*     BNPNo results for input(s): BNP, PROBNP in the last 168 hours.   DDimer No results for input(s): DDIMER in the last 168 hours.   Radiology    No results found.  Cardiac Studies    ECHO 10/20/2016 - Left ventricle: The cavity size was normal. There was mild focalbasal hypertrophy of the septum. Systolic function was normal.The estimated ejection fraction was in the range of 55% to 60%.Wall motion was normal; there were no regional wall motionabnormalities. Features are consistent with a pseudonormal leftventricular filling pattern, with concomitant abnormal relaxationand increased filling pressure (grade 2 diastolic dysfunction).Doppler parameters are consistent with high ventricular fillingpressure. - Aortic valve: Transvalvular velocity was within the normal range.There was no stenosis. There was no regurgitation. - Mitral valve: Transvalvular velocity was within the normal range.There was no evidence for stenosis. There was trivialregurgitation. - Left atrium: The atrium was mildly dilated. - Right ventricle: The cavity size was normal. Wall thickness wasnormal. Systolic function was normal. - Tricuspid valve: There was no regurgitation.  Cardiac cath 10/23/2016  Ost Cx to Prox Cx lesion, 100 %stenosed.  Prox RCA lesion, 100 %stenosed.  Prox LAD lesion, 40 %stenosed.  Mid LAD lesion, 70 %stenosed.  Ost 1st Diag to 1st Diag lesion, 70 %stenosed.  Ost 2nd Diag to 2nd Diag lesion, 80 %stenosed.  Mid RCA to Dist RCA lesion, 100 %stenosed.  1. Significant three-vessel coronary artery disease. Chronically occluded right coronary artery with left to right collaterals supplying the right PDA. Chronically occluded proximal left circumflex which seems to be a small vessel overall with some left to left collaterals. Most of the lateral wall seems to be supplied by a large ramus branch. The LAD has a discrete 70% stenosis in a tortuous midsegment . The coronary arteries are overall moderately calcified and extremely tortuous likely due to hypertensive heart disease.  2. Moderately elevated left ventricular end-diastolic pressure. Left ventricular angiography was  not performed due to kidney disease. EF was normal by echo.  Recommendations: I don't think there woud be significant advantage from CABG given that the disease in the LAD does not seem to be critical and the only other graftable  vessel would be the right PDA. The OMs seem to be very small and diffusely diseased. The best option is probably to treat medically for now and consider mid LAD PCI in the near future (few months) depending on his clinical course and after making sure he recovers from current illnesses including GI bleed and renal failure.   Diagnostic Diagram   presenting with small non-STEMI in the setting of acute GI bleed and severe anemia (angiodysplasia of the colon, chronic iron deficiency, newly declared end-stage renal disease) found to have severe multivessel coronary artery disease with total occlusion of the right coronary artery and left circumflex coronary artery and a high-grade stenosis in the mid LAD artery, but with preserved left ventricular systolic function by echo       Patient Profile     76 y.o. male presenting with small non-STEMI in the setting of acute GI bleed and severe anemia (angiodysplasia of the colon, chronic iron deficiency, newly declared end-stage renal disease) found to have severe multivessel coronary artery disease with total occlusion of the right coronary artery and left circumflex coronary artery and a high-grade stenosis in the mid LAD artery, but with preserved left ventricular systolic function by echo  Assessment & Plan    1. CAD/NSTEMI: Chronic total occlusion of the right coronary artery and nondominant left circumflex coronary artery, with a patent large ramus intermedius artery feeding most of the lateral wall and a 70% stenosis in the mid LAD artery which provides collateral flow to the inferior wall. He is a very poor candidate for bypass surgery due to poor rehabilitation potential, multiple comorbid conditions and limited  options for revascularization (no good targets in the left circumflex territory). Best option for revascularization will probably be PCI-stent to the mid LAD artery. This would also be a high risk procedure due to the same considerations, but would be better tolerated. Since the lesion in the LAD is relatively short and the vessel is large in caliber, a bare-metal stent would likely be the best choice in this patient without diabetes who is at substantial risk for recurrent GI bleeding. Using a bare-metal stent would also have the advantage that would allow future surgical procedures to be performed with less delay.  Start aspirin and clopidogrel today. Hopefully he will tolerate these without bleeding. Will be seen at least 3 times a week in dialysis, when we should have the opportunity to pick up problems with anemia or bleeding. Will be seen in the [facility] clinic by Ignacia Bayley on August 21. If appropriate he will then be scheduled for a PCI-stent of the LAD. If a bare-metal stent is used, he will then have to continue aspirin and clopidogrel for 30 days without interruption.  2. PAD with stable intermittent claudication. 3. Smoking, cessation advised. 4. Anemia/recent GI bleeding: Colonic angiodysplasia treated, EGD negative. Stable hemoglobin since transfusion. No evidence of active bleeding. 5. KYH:CWCB elevated SBP, but at times has markedly decreased diastolic BP. Persistentbradycardia. This limits the use of beta blockers. 6. Preop risk evaluation: With our current knowledge of his coronary anatomy and the recent small non-STEMI due to demand ischemia, his risk of major cardiovascular complications with placement of an AV fistula is at least moderate. The risk is not prohibitive, but from a cardiovascular point of view the safest approach would be to perform revascularization of the LAD artery, followed by 1 month of dual antiplatelet therapy (for a bare-metal stent), after which the AV  fistula can be surgically placed. Unfortunately,  this will delay permanent dialysis access availability for a total of about 4-5 months. Will discuss with Dr. Lorrene Reid and Dr. Scot Dock.   Again these considerations were reviewed in detail with Mr. Ruacho (who never participates in the conversation much), also with his daughter. He will be staying with his daughter in [place] in the immediate future. She is involved in clinical research and appears to have a good understanding of these complicated medical issues.   Signed, Sanda Klein, MD  10/25/2016, 2:43 PM

## 2016-10-25 NOTE — Progress Notes (Signed)
Patient is back from dialysis. Complete assessment done- Right upper chest dialysis catheter is dry and intact and well secured with transparent/occlusive dressing. BP 181/67 , will administer hydralazine as per order.

## 2016-10-25 NOTE — Care Management Note (Addendum)
Case Management Note  Patient Details  Name: Roger [name] MRN: [id number] Date of Birth: May 25, 1940  Subjective/Objective:  Pt admitted with blood in stool and epigastric discomfort                  Action/Plan:  PTA from home alone - friends help when needed.  Pt has walker and cane but doesn't use them often.  CM provided health connect number on AVS - per daughter "he cussed out the doctor" - its been over a year since pt has seen primary doctor.  Pt agreed to bedside commode - chose [facility] - agency contacted and referral accepted.   Daughter raised concerns with pts mobility at home - PT/OT ordered.  CM will continue to follow for discharge needs   Expected Discharge Date:  10/24/16               Expected Discharge Plan:  [place]  In-House Referral:     Discharge planning Services  CM Consult  Post Acute Care Choice:  Durable Medical Equipment Choice offered to:  Patient  DME Arranged:  Bedside commode DME Agency:  [place] Arranged:  PT [place]:  [place]  Status of Service:  Completed, signed off  If discussed at [place] of Stay Meetings, dates discussed:  10/24/16  Additional Comments: 10/25/2016  Daughters address is actually [place] [address] [facility] can not accept pt.  CM contacted [facility] and they are able to service pt both in [place] and when he returns to [place].  CM spoke with daughter and informed of the need to an agency that can service both locations  Per attending pt has already been clipped and has an outpt HD center referral for T,T, S treatment.  CM offered choice for [facility] - pt chose [facility] - agency contacted and referral accepted.  [facility] will deliver bedside commode prior to discharge.  Pt will purchase shower chair directly.  Pts daughter will ensure pt gets to and from HD sessions until SCAT arrangements can be made (CSW consulted) , CM informed both pt and daughter that he will likely have a copay  for transport.  Daughter informed CM that she may take pt home with her to [place] for the interim - [place] contacted and can provide [place] to her city.  [facility] to contact daughter regarding discharge location  10/24/16 Discussed in LOS 8/9 - pt remains apporpriate for continued stay.  [place]  Placed and pt has successfully tolerated HD.  Per nephrology clipping process has began.  Therapy recommending HH - CM will arrange   10/22/16 Pt will initiate  long term HD this admit with [facility] being placed today. Heart Cath planned for tomorrow for CAD. Maryclare Labrador, RN 10/25/2016, 10:12 AM

## 2016-10-25 NOTE — Progress Notes (Signed)
[facility]  Subjective/interim history:  TDC placed 8/7 R IJ (Dr. Oneida Alar) Had 1st HD 8/7, 2nd 8/9 Has outpt spot at [facility] TTS Had cath 8/8 results and recommendations noted (PCI of LAD planned in about a month) Dr. Scot Dock and Dr. Sallyanne Kuster have discussed his CAD issues as related to permanent access placement and plan is to do access AFTER PCI (which is not to be done this admission d/t GI bleeding issues)  CDiff studies negative Diarrhea stopped   Vitals:   10/24/16 2320 10/24/16 2327 10/25/16 0102 10/25/16 0407  BP: (!) 166/75 (!) 174/71 (!) 181/67 (!) 168/56  Pulse: (!) 57 (!) 54  68  Resp: (!) '22 20  16  '$ Temp:  99.1 F (37.3 C)  99.5 F (37.5 C)  TempSrc:  Oral  Oral  SpO2: 98% 100%  99%  Weight:  91 kg (200 lb 9.9 oz)  89.8 kg (198 lb)  Height:       Exam: Older AAM, very nice VS as noted Sitting on edge of bed Poor dentition No JVD Lungs clear S1S2 No S3 Short systolic murmur 1/6 at base Abd obese, soft, not tender No LE edema, skin dry and ashy R sided TDC with dry dressing in place (8/7)  Inpatient medications: . allopurinol  100 mg Oral QODAY  . amLODipine  10 mg Oral Daily  . atorvastatin  80 mg Oral q1800  . carvedilol  3.125 mg Oral BID WC  . darbepoetin (ARANESP) injection - NON-DIALYSIS  150 mcg Subcutaneous Q Mon-1800  . hydrALAZINE  50 mg Oral Q8H  . multivitamin  1 tablet Oral QHS  . pantoprazole  40 mg Oral QAC breakfast  . sodium chloride flush  3 mL Intravenous Q12H   . sodium chloride Stopped (10/23/16 0359)  . sodium chloride    . ferric gluconate (FERRLECIT/NULECIT) IV Stopped (10/24/16 1431)   sodium chloride, acetaminophen **OR** acetaminophen, alteplase, heparin, hydrALAZINE, sodium chloride flush  (Renal PTA history) Creat               Date                 eGFR 2012                2.7- 3.5 2013                1.95- 2.49 2014                2.42- 3.32        20- 25 10/18/16              5.74 10/19/16               5.38                 10       Recent Labs  10/24/16 1954 10/25/16 0257  WBC 7.9 8.5  HGB 8.9* 9.2*  HCT 27.9* 28.9*  MCV 90.9 89.5  PLT 177 174     Recent Labs Lab 10/24/16 0303 10/24/16 1955 10/25/16 0257  NA 141 141 138  K 4.3 4.4 3.6  CL 112* 114* 103  CO2 19* 19* 25  GLUCOSE 98 109* 93  BUN 19 25* 8  CREATININE 4.40* 4.67* 2.81*  CALCIUM 7.8* 7.8* 8.0*  PHOS 3.0 3.0 2.4*    Recent Labs Lab 10/19/16 1058 10/20/16 0700 10/21/16 0607  10/24/16 0303 10/24/16 1955 10/25/16 0257  AST '21 22 19  '$ --   --   --   --  ALT 11* 11* 9*  --   --   --   --   ALKPHOS 122 125 117  --   --   --   --   BILITOT 0.5 0.8 0.7  --   --   --   --   PROT 6.9 6.6 6.1*  --   --   --   --   ALBUMIN 3.7 3.5 3.3*  < > 3.1* 3.3* 3.4*  < > = values in this interval not displayed.  Lab Results  Component Value Date   PTH 212 (H) 10/22/2016   CALCIUM 8.0 (L) 10/25/2016   PHOS 2.4 (L) 10/25/2016       Component Value Date/Time   IRON 24 (L) 10/22/2016 0229   TIBC 242 (L) 10/22/2016 0229   FERRITIN 88 10/22/2016 0229   IRONPCTSAT 10 (L) 10/22/2016 0229   Cardiac cath report 10/23/2016 1. Significant three-vessel coronary artery disease. Chronically occluded right coronary artery with left to right collaterals supplying the right PDA. Chronically occluded proximal left circumflex which seems to be a small vessel overall with some left to left collaterals. Most of the lateral wall seems to be supplied by a large ramus branch. The LAD has a discrete 70% stenosis in a tortuous midsegment . The coronary arteries are overall moderately calcified and extremely tortuous likely due to hypertensive heart disease. 2. Moderately elevated left ventricular end-diastolic pressure. Left ventricular angiography was not performed due to kidney disease. EF was normal by echo.  Background: 76 y.o. year-old with history of CKD stage 4, PVD, anemia , HTN and gout who presented to the ED 10/18/16 with c/o  acid reflux and belching after eating, not sleeping because of it, some vomiting. In ED Hb 6.2/chest pain, trop ^'d, bloody stools. Creatinine 5.74 GFR 10. Stopped all f/u with CKD (DR. Sanford) a year ago. Uremic sx on admission. Opted for HD. 1st HD 8/7. New ESRD   Impression/Recommendations:  1. New ESRD presumed 2/2 nephrosclerosis  1. TDC placed 8/7 R IJ (Dr. Oneida Alar).  2. Had 1st HD 8/7, 2nd 8/9.   3. Has outpt spot at [facility] TTS.   4. Had cath 8/8 results and recommendations noted (PCI of LAD planned in about a month) As such, Dr. Scot Dock and Dr. Sallyanne Kuster have discussed his CAD issues as related to permanent access placement and plan is to do access 1 month AFTER PCI (which is not to be done this admission d/t GI bleeding issues) 5. HD on Saturday 8/11 and if all stable, could be d/c from a renal standpoint 6. Post HD weight in AM wil be EDW 2. CAD 1. Nuclear stress test large size, severe intensity reversible anterior, apical and inferolateral perfusion defect suggestive of ischemia.  2. LVEF 40% with inferolateral wall hypokinesis.   3. PCI planned in about a month 2. Rectal bleeding/ anemia - EGD neg. Colonoscopy angiodysplasia/tics. No active bleeding now.  Hb stable. 1. S/p total 3 U blood this admission 2. Started Aranesp 200/week 8/6.  3. Repleting Fe 250 IV QD X 4 for low Tsat of 10%. First dose given 8/8. 3. HTN - hydralazine/coreg/amlodipine 4. AFib  5. Hx DVT 6. PAD hx claudication 7. Hx prostate cancer rx seed implants 8. Gout - Uloric changed to allopurinol 9. Bones -  PTH 212. In goal range for CKD5. Ca/phos OK. No binder needed at this time. 10. Diarrhea - CDiff negative  Jamal Maes, MD [facility] [phone] Pager 10/25/2016, 7:23  AM    

## 2016-10-25 NOTE — Telephone Encounter (Signed)
MC calling for ph appt Cath by Arida   Needs 1-2 weeks fu then 4-6 wk fu  Scheduled with:    Roger [name] 8/22 at 2pm   Arida 10/2 at 1:40

## 2016-10-26 LAB — RENAL FUNCTION PANEL
Albumin: 3.3 g/dL — ABNORMAL LOW (ref 3.5–5.0)
Anion gap: 8 (ref 5–15)
BUN: 17 mg/dL (ref 6–20)
CALCIUM: 8.1 mg/dL — AB (ref 8.9–10.3)
CHLORIDE: 106 mmol/L (ref 101–111)
CO2: 25 mmol/L (ref 22–32)
CREATININE: 4.34 mg/dL — AB (ref 0.61–1.24)
GFR, EST AFRICAN AMERICAN: 14 mL/min — AB (ref >60)
GFR, EST NON AFRICAN AMERICAN: 12 mL/min — AB (ref >60)
Glucose, Bld: 96 mg/dL (ref 65–99)
Phosphorus: 2.5 mg/dL (ref 2.5–4.6)
Potassium: 3.9 mmol/L (ref 3.5–5.1)
SODIUM: 139 mmol/L (ref 135–145)

## 2016-10-26 LAB — CBC
HCT: 27 % — ABNORMAL LOW (ref 39.0–52.0)
HEMOGLOBIN: 8.6 g/dL — AB (ref 13.0–17.0)
MCH: 28.9 pg (ref 26.0–34.0)
MCHC: 31.9 g/dL (ref 30.0–36.0)
MCV: 90.6 fL (ref 78.0–100.0)
PLATELETS: 174 10*3/uL (ref 150–400)
RBC: 2.98 MIL/uL — ABNORMAL LOW (ref 4.22–5.81)
RDW: 16.3 % — ABNORMAL HIGH (ref 11.5–15.5)
WBC: 8.9 10*3/uL (ref 4.0–10.5)

## 2016-10-26 LAB — URIC ACID: Uric Acid, Serum: 1.5 mg/dL — ABNORMAL LOW (ref 4.4–7.6)

## 2016-10-26 MED ORDER — HYDRALAZINE HCL 50 MG PO TABS: 50.0000 mg | ORAL_TABLET | Freq: Three times a day (TID) | ORAL | 0 refills | Status: DC

## 2016-10-26 MED ORDER — CLOPIDOGREL BISULFATE 75 MG PO TABS: 75.0000 mg | ORAL_TABLET | Freq: Every day | ORAL | 0 refills | Status: DC

## 2016-10-26 MED ORDER — ATORVASTATIN CALCIUM 80 MG PO TABS: 80.0000 mg | ORAL_TABLET | Freq: Every day | ORAL | 0 refills | Status: DC

## 2016-10-26 MED ORDER — AMLODIPINE BESYLATE 10 MG PO TABS: 10.0000 mg | ORAL_TABLET | Freq: Every day | ORAL | 0 refills | Status: DC

## 2016-10-26 MED ORDER — ESOMEPRAZOLE MAGNESIUM 40 MG PO CPDR: 40.0000 mg | DELAYED_RELEASE_CAPSULE | Freq: Every day | ORAL | 0 refills | Status: DC

## 2016-10-26 MED ORDER — ASPIRIN EC 81 MG PO TBEC: 81.0000 mg | DELAYED_RELEASE_TABLET | Freq: Every day | ORAL | 0 refills | Status: DC

## 2016-10-26 MED ORDER — CARVEDILOL 3.125 MG PO TABS: 3.1250 mg | ORAL_TABLET | Freq: Two times a day (BID) | ORAL | 0 refills | Status: DC

## 2016-10-26 MED ORDER — RENA-VITE PO TABS: 1.0000 | ORAL_TABLET | Freq: Every day | ORAL | 0 refills | Status: DC

## 2016-10-26 MED ORDER — ALLOPURINOL 100 MG PO TABS: 100.0000 mg | ORAL_TABLET | Freq: Every day | ORAL | 0 refills | Status: DC

## 2016-10-26 NOTE — Procedures (Signed)
I have personally attended this patient's dialysis session.   No heparin Using [facility] Pre weight 90.2 and I think EDW around 90 Will weigh post HD for clarification 4K bath pending labs  Jamal Maes, MD [place] Pager 10/26/2016, 7:54 AM

## 2016-10-26 NOTE — Progress Notes (Signed)
[facility]  Subjective/interim history:  TDC placed 8/7 R IJ (Dr. Oneida Alar) Had 1st HD 8/7, 2nd 8/9, 3rd today Has outpt spot at [facility] T[facility] Had cath 8/8 results and recommendations noted (PCI of LAD planned in about a month) Dr. Scot Dock and Dr. Sallyanne Kuster have discussed his CAD issues as related to permanent access placement and plan is to do access AFTER PCI (which is not to be done this admission d/t GI bleeding issues)  Seen in HD No new complaints overnight Hopeful for discharge after HD   Vitals:   10/26/16 0419 10/26/16 0705 10/26/16 0713 10/26/16 0718  BP: (!) 151/45 (!) 144/64 (!) 146/61 (!) 143/64  Pulse: 62 (!) 58 (!) 56 (!) 55  Resp: _0 Temp: 98.6 F (37 C) 99.4 F (37.4 C)    TempSrc:  Oral    SpO2: 96% 98%    Weight: 91.2 kg (201 lb) 90.2 kg (198 lb 13.7 oz)    Height:       Exam: Older AAM, very nice VS as noted Pre HD weight 90.2 kg Poor dentition No JVD Lungs clear S1S2 No S3 Short systolic murmur 1/6 at base Abd obese, soft, not tender No LE edema, skin dry and ashy R sided TDC with dry dressing in place (8/7) in use for HD  Inpatient medications: . allopurinol  100 mg Oral Daily  . amLODipine  10 mg Oral Daily  . atorvastatin  80 mg Oral q1800  . carvedilol  3.125 mg Oral BID WC  . clopidogrel  75 mg Oral Daily  . darbepoetin (ARANESP) injection - NON-DIALYSIS  150 mcg Subcutaneous Q Mon-1800  . hydrALAZINE  50 mg Oral Q8H  . multivitamin  1 tablet Oral QHS  . pantoprazole  40 mg Oral QAC breakfast  . sodium chloride flush  3 mL Intravenous Q12H   . sodium chloride Stopped (10/23/16 0359)  . sodium chloride    . ferric gluconate (FERRLECIT/NULECIT) IV Stopped (10/25/16 1144)   sodium chloride, acetaminophen **OR** acetaminophen, alteplase, heparin, hydrALAZINE, sodium chloride flush  (Renal PTA history) Creat               Date                 eGFR 2012                2.7- 3.5 2013                1.95-  2.49 2014                2.42- 3.32        20- 25 10/18/16              5.74 10/19/16              5.38                 10       Recent Labs  10/24/16 1954 10/25/16 0257  WBC 7.9 8.5  HGB 8.9* 9.2*  HCT 27.9* 28.9*  MCV 90.9 89.5  PLT 177 174     Recent Labs Lab 10/24/16 0303 10/24/16 1955 10/25/16 0257  NA 141 141 138  K 4.3 4.4 3.6  CL 112* 114* 103  CO2 19* 19* 25  GLUCOSE 98 109* 93  BUN 19 25* 8  CREATININE 4.40* 4.67* 2.81*  CALCIUM 7.8* 7.8* 8.0*  PHOS 3.0 3.0 2.4*  Recent Labs Lab 10/19/16 1058 10/20/16 0700 10/21/16 0607  10/24/16 0303 10/24/16 1955 10/25/16 0257  AST _0 --   --   --   --   ALT 11* 11* 9*  --   --   --   --   ALKPHOS 122 125 117  --   --   --   --   BILITOT 0.5 0.8 0.7  --   --   --   --   PROT 6.9 6.6 6.1*  --   --   --   --   ALBUMIN 3.7 3.5 3.3*  < > 3.1* 3.3* 3.4*  < > = values in this interval not displayed.  Lab Results  Component Value Date   PTH 212 (H) 10/22/2016   CALCIUM 8.0 (L) 10/25/2016   PHOS 2.4 (L) 10/25/2016       Component Value Date/Time   IRON 24 (L) 10/22/2016 0229   TIBC 242 (L) 10/22/2016 0229   FERRITIN 88 10/22/2016 0229   IRONPCTSAT 10 (L) 10/22/2016 0229   Cardiac cath report 10/23/2016 1. Significant three-vessel coronary artery disease. Chronically occluded right coronary artery with left to right collaterals supplying the right PDA. Chronically occluded proximal left circumflex which seems to be a small vessel overall with some left to left collaterals. Most of the lateral wall seems to be supplied by a large ramus branch. The LAD has a discrete 70% stenosis in a tortuous midsegment . The coronary arteries are overall moderately calcified and extremely tortuous likely due to hypertensive heart disease. 2. Moderately elevated left ventricular end-diastolic pressure. Left ventricular angiography was not performed due to kidney disease. EF was normal by echo.  Background: 76 y.o. year-old with  prior history of CKD stage 4 (stopped all f/u with CKD Dr. Joelyn Oms a year ago) , PVD, anemia , HTN and gout who presented to the ED 10/18/16 with c/o acid reflux and belching after eating, not sleeping because of it, some vomiting. In ED Hb 6.2/chest pain, trop ^'d, bloody stools. Creatinine 5.74 GFR 10.  Uremic sx on admission. Opted for HD. 1st HD 8/7. New ESRD   Impression/Recommendations:  1. New ESRD presumed 2/2 nephrosclerosis  1. TDC placed 8/7 R IJ (Dr. Oneida Alar).  2. Had 1st HD 8/7, 2nd 8/9, 3rd 8/11.   3. Has outpt spot at [facility] TTS 11:25 (arrive at 10:45 1st day to sign papers).   4. Had cath 8/8 results and recommendations noted (PCI of LAD planned in about a month) As such, Dr. Scot Dock and Dr. Sallyanne Kuster have discussed CAD issues and plan is to do access 1 month AFTER PCI (PCI not to be done this admission d/t GI bleeding issues) 5. HD today 8/11 and if all stable, can be d/c from a renal standpoint 6. Post HD weight in AM will be EDW 7. No heparin with HD for at last another 2 weeks, then tight  2. CAD/NSTEMI 1. Nuclear stress test large size, severe intensity reversible anterior, apical and inferolateral perfusion defect suggestive of ischemia.  2. LVEF 40% with inferolateral wall hypokinesis.   3. PCI planned in about a month (top see Ignacia Bayley Aigust 21 and if appropriate will be scheduled for PCI/stent of the LAD and if bare metal stent used will need 30 days ASA/Plavix before access can be placed.  2. Rectal bleeding/ anemia - EGD neg. Colonoscopy angiodysplasia/tics. No active bleeding now.  Hb stable. 1. S/p total 3 U blood this  admission 2. Started Aranesp 200/week 8/6 (will be converted to Mircera at his ouitpt HD unit - no need to include this in d/c meds.  3. Repleting Fe c/Ferrlecit 250 IV QD X 4 for low Tsat of 10%. First dose given 8/8. Will get last dose today in HD 3. HTN - hydralazine/coreg/amlodipine 4. AFib  5. Hx DVT 6. PAD hx claudication 7. Hx  prostate cancer rx seed implants 8. Gout - Uloric changed to allopurinol 9. Bones -  PTH 212. In goal range for CKD5. Ca/phos OK. No binder needed at this time. 10. Diarrhea - CDiff negative  Renal disposition: From my standpoint can be discharged after HD today with dialysis and related med issues italicized above.  Roger Maes, MD [facility] [phone] Pager 10/26/2016, 7:37 AM

## 2016-10-26 NOTE — Discharge Summary (Signed)
Discharge Summary  Roger [name] DPO:[id number] DOB: [birth year]  PCP: [name], MD  Admit date: 10/18/2016 Discharge date: 10/26/2016  Time spent: >71mins, more than 50% time spent on coordination of care  Recommendations for Outpatient Follow-up:  1. F/u with PMD within a week  for hospital discharge follow up, repeat cbc/bmp at follow up 2. F/u with nephrology, HD TTS 3. F/u with cardiology 4. F/u with vascular surgery Dr Scot Dock 5. F/u with GI Dr Carlean Purl as needed 6. F/u with urology Dr Jeffie Pollock for h/o prostate cancer 7. Home health arranged   Discharge Diagnoses:  Active Hospital Problems   Diagnosis Date Noted  . Elevated troponin 10/18/2016  . Angiodysplasia of cecum   . Benign neoplasm of cecum   . Benign neoplasm of ascending colon   . Benign neoplasm of descending colon   . CKD (chronic kidney disease) stage 5, GFR less than 15 ml/min (HCC)   . Melena   . Demand ischemia ([place])   . Lower GI bleed   . Tobacco abuse   . Chest pain 10/18/2016  . ARF (acute renal failure) ([place]) 10/18/2016  . Hyperkalemia 10/18/2016  . Blood loss anemia 10/18/2016    Resolved Hospital Problems   Diagnosis Date Noted Date Resolved  No resolved problems to display.    Discharge Condition: stable  Diet recommendation: renal diet   Filed Weights   10/26/16 0419 10/26/16 0705 10/26/16 1114  Weight: 91.2 kg (201 lb) 90.2 kg (198 lb 13.7 oz) 89.4 kg (197 lb 1.5 oz)    History of present illness:  Roger [name]  is a 76 y.o. male, w CKD stage4/5 PVD, Pafib ?,  Anemia, apparently went out to eat and then c/o nv, no bloody emesis. + diaphoresis,  + burning pain in chest for a while, Pt notes blood in stool (black stool) and epigastric discomfort for 1-2 months.  Denies NSAIDS.  Pt brought to ED by daughter to be evaluated  In ED, pt found to have Hgb 6.2, Bun/Creat 49/5.74, K=5.6  , Glucose 122, Trop 2.31,  EKG nsr at 90, nl axis, st depression v2-6.  ED contacted cardiology who will  consult, thought that this was related to demand ischemia.  pt will be admitted for ARF on CRF, and Anemia and hyperkalemia.   Hospital Course:  Principal Problem:   Elevated troponin Active Problems:   Chest pain   ARF (acute renal failure) (HCC)   Hyperkalemia   Blood loss anemia   Demand ischemia (HCC)   Lower GI bleed   Tobacco abuse   CKD (chronic kidney disease) stage 5, GFR less than 15 ml/min (HCC)   Melena   Angiodysplasia of cecum   Benign neoplasm of cecum   Benign neoplasm of ascending colon   Benign neoplasm of descending colon   Acute GI bleed with acute blood loss anemia:resolved ,Status post transfusion of 3 units packed RBCs , hemoglobin around 8-9 stable ,EGD normal /Colonoscopy showed angiodysplasia with diverticulosis with no apparent bleeding , he has polypectomy during colonoscopy, path showed tubular adenomas  no more apparent GI bleed, he was on PPI drip, now on po ppi, tolerate  diet . Per GI Dr Carlean Purl on 8/6:  "he is at higher risk of more bleeding problems so if a bare metal stent and shorter-term anti-PLT (clopidoigrel/ticagrelor) Tx I think would be better." -he reported diarrhea on 8/9 , no blood ,  c diff negative, diarrhea has stopped, he reports h/o lactose intolerance.  CAD/ Type  2 non-ST elevation MI in the setting of supply demand mismatch ( WITH UNDERLYING SEVER CAD) in the setting of anemia/GI bleed chest pain-free for now, troponin peaked at 3.4 trending down.   hold aspirin or any anticoagulation held initially due to acute GI bleed, stress test showed large reversible ischemia ,  cath on 8/8 after first  HD on 8/7 . start asa/plavix  On 8/10 To see if patient can tolerate without gi bleed, then proceed with revascularization in 4-6 weeks, bare metal stent if preferred, then plan to continue asa/plavix for 30dasy post stenting,  continue beta blocker ,statin, close follow up with cardiology.   AKI/CKD IVwith chronic related anemia/now  ESRD started on HD on 8/7: Progressive CKD ,Related to dehydration and anemia, complicated with hyperkalemia (k 6 on admission, resolved), right sided dialysis catheter placed and HD initiated on 8/7, patient prefers to have permanent dialysis access placement after cardiac revascularization, vascular surgery input appreciated.  He received three sessions of HD (last on 8/11), he is cleared to discharge by nephrology, outpatient HD on TTS scheduled.   HTN : discharged on coreg/Norvasc/ Hydralazine.  H/o gout , he report had severe gout arthritis and see a gout specilist Dr Ouida Sills in town, he was started on uloric a few months ago, I have discussed cardiac side effect reported on uloric, patient and daughter agreed to switch to allopurinol 100mg  daily ( nephrology oked with daily dosing), uric acid 1.5.  he is advised to follow up with Dr Ouida Sills for further management of gout.  Hx of PAD with stable bilateral lower extremity claudication, he is followed by vascular surgery  Dr Scot Dock in the past ( I have personally reviewed outpatient notes from vascular surgery)  H/o Prostate cancer s/p seeds implants, daughter report patient has lost follow up  With Dr Jeffie Pollock, Dr Ralene Muskrat contact number provided at discharge.  H/o AFib? Has been sinus rhythm since admission ( I personally reviewed and interpreted  tele strip  Daily)  Cigarettes smoking: smoking cessation education provided at length   Body mass index is 31.01 kg/m.   Code Status: full, comfirmed  Family Communication: patient and daughter at bedside  Disposition Plan:  home health  cleared for discharge by nephrology and cardiology   Consultants:  GI   Cardiology  nephrology  Vascular surgery  Procedures:  Tunneled catheter placement for HD on 8/7  Egd/colonoscopy  Stress test   cardiac cath on 8/8  HD started on 8/7  prbc transfusion 3units  Antibiotics: none  Discharge Exam: BP (!) 159/69  (BP Location: Right Arm)   Pulse (!) 59   Temp 98.5 F (36.9 C) (Oral)   Resp 20   Ht 5\' 7"  (1.702 m)   Wt 89.4 kg (197 lb 1.5 oz)   SpO2 98%   BMI 30.87 kg/m     General:  NAD, poor dentition , right sided dialysis catheter in place  Cardiovascular: RRR  Respiratory: CTABL  Abdomen: Soft/ND/NT, positive BS  Musculoskeletal: No Edema  Neuro: aaox3   Discharge Instructions You were cared for by a hospitalist during your hospital stay. If you have any questions about your discharge medications or the care you received while you were in the hospital after you are discharged, you can call the unit and asked to speak with the hospitalist on call if the hospitalist that took care of you is not available. Once you are discharged, your primary care physician will handle any further medical issues. Please note that  NO REFILLS for any discharge medications will be authorized once you are discharged, as it is imperative that you return to your primary care physician (or establish a relationship with a primary care physician if you do not have one) for your aftercare needs so that they can reassess your need for medications and monitor your lab values.  Discharge Instructions    Diet general    Complete by:  As directed    Renal diet   Face-to-face encounter (required for Medicare/Medicaid patients)    Complete by:  As directed    I Avneet Ashmore certify that this patient is under my care and that I, or a nurse practitioner or physician's assistant working with me, had a face-to-face encounter that meets the physician face-to-face encounter requirements with this patient on 10/26/2016. The encounter with the patient was in whole, or in part for the following medical condition(s) which is the primary reason for home health care (List medical condition): FTT   The encounter with the patient was in whole, or in part, for the following medical condition, which is the primary reason for home health care:   FTT   I certify that, based on my findings, the following services are medically necessary home health services:   Nursing Physical therapy     Reason for Medically Necessary Home Health Services:  Skilled Nursing- Change/Decline in Patient Status   My clinical findings support the need for the above services:  Pain interferes with ambulation/mobility   Further, I certify that my clinical findings support that this patient is homebound due to:  Pain interferes with ambulation/mobility   Home Health    Complete by:  As directed    To provide the following care/treatments:   PT RN OT Home Health Aide     Increase activity slowly    Complete by:  As directed      Allergies as of 10/26/2016   No Known Allergies     Medication List    STOP taking these medications   predniSONE 10 MG tablet Commonly known as:  DELTASONE   ULORIC 80 MG Tabs Generic drug:  Febuxostat     TAKE these medications   allopurinol 100 MG tablet Commonly known as:  ZYLOPRIM Take 1 tablet (100 mg total) by mouth daily.   amLODipine 10 MG tablet Commonly known as:  NORVASC Take 1 tablet (10 mg total) by mouth daily.   aspirin EC 81 MG tablet Take 1 tablet (81 mg total) by mouth daily.   atorvastatin 80 MG tablet Commonly known as:  LIPITOR Take 1 tablet (80 mg total) by mouth daily at 6 PM.   carvedilol 3.125 MG tablet Commonly known as:  COREG Take 1 tablet (3.125 mg total) by mouth 2 (two) times daily with a meal.   clopidogrel 75 MG tablet Commonly known as:  PLAVIX Take 1 tablet (75 mg total) by mouth daily.   esomeprazole 40 MG capsule Commonly known as:  NEXIUM Take 1 capsule (40 mg total) by mouth daily. What changed:  medication strength  how much to take   hydrALAZINE 50 MG tablet Commonly known as:  APRESOLINE Take 1 tablet (50 mg total) by mouth every 8 (eight) hours.   multivitamin Tabs tablet Take 1 tablet by mouth at bedtime.   ranitidine 150 MG tablet Commonly  known as:  ZANTAC Take 150 mg by mouth daily as needed for heartburn.      No Known Allergies Follow-up Information    [facility] Follow up.   Contact information: Please call [phone] for assistance in locating a Primary Care Doctor within [facility] network that accepts insurance       Angelia Mould, MD Follow up.   Specialties:  Vascular Surgery, Cardiology Why:  arrange for a [facility] AVF placement  Contact information: [address] [place] [address] [phone]        Gatha Mayer, MD Follow up.   Specialty:  Gastroenterology Why:  please call your GI docotor if you notice blood in the stool Contact information: [address] [address] [phone]        [facility] - Dme Follow up.   Why:  bedside commode Contact information: [address] [place] [address] [phone]        Wellington Hampshire, MD Follow up on 12/17/2016.   Specialty:  Cardiology Why:  Aug 22 at 2pm with Ignacia Bayley Oct 2 1:40 pm with Dr. Collene Gobble information: [address] [place] [address] [phone]        Rogelia Mire, NP Follow up on 11/06/2016.   Specialties:  Nurse Practitioner, Cardiology, Radiology Why:  2pm hospital follow up, schedule cath Contact information: [place] [address] [phone]        [facility] Follow up.   Specialty:  Home Health Services Why:  Physical therapy Contact information: [place] [address] [phone]        Irine Seal, MD Follow up.   Specialty:  Urology Contact information: [place] [address] [phone]            The results of significant diagnostics from this hospitalization (including imaging, microbiology, ancillary and laboratory) are listed below for reference.    Significant Diagnostic Studies: Ct Abdomen Pelvis Wo  Contrast  Result Date: 10/19/2016 CLINICAL DATA:  76 year old male with abdominal pain. EXAM: CT ABDOMEN AND PELVIS WITHOUT CONTRAST TECHNIQUE: Multidetector CT imaging of the abdomen and pelvis was performed following the standard protocol without IV contrast. COMPARISON:  Abdominal CT dated 04/26/2009 FINDINGS: Evaluation of this exam is limited in the absence of intravenous contrast. Lower chest: The visualized lung bases are clear. There is coronary vascular calcification. There is hypoattenuation of the cardiac blood pool suggestive of a degree of anemia. Clinical correlation is recommended. No intra-abdominal free air or free fluid. Hepatobiliary: No focal liver abnormality is seen. No gallstones, gallbladder wall thickening, or biliary dilatation. Pancreas: Unremarkable. No pancreatic ductal dilatation or surrounding inflammatory changes. Spleen: Focal prominence of the superior pole of the spleen (series 2, image 16 and coronal series 5, image 124) is not well characterized and may be related to splenic morphology or represent a splenic lesion. This is new compared to the prior CT of 2011. CT with IV contrast or MRI may provide better characterization if clinically indicated. Adrenals/Urinary Tract: The adrenal glands are unremarkable. There is moderate bilateral renal atrophy, right greater than left. Subcentimeter right renal exophytic hypodense lesions are not characterized but likely represent cysts. There is no hydronephrosis or nephrolithiasis on either side. The visualized ureters appear unremarkable. The urinary bladder is only partially distended and is grossly unremarkable. Stomach/Bowel: There are small scattered colonic as well as terminal ileum diverticula without active inflammatory changes. There is no evidence of bowel obstruction or active inflammation. Normal appendix. Vascular/Lymphatic: There is moderate aortoiliac atherosclerotic disease. Evaluation of  the vasculature is limited in the  absence of intravenous contrast. No portal venous gas identified. There is no adenopathy. Reproductive: The prostate gland is enlarged measuring approximately 5.8 cm in transverse diameter (previously 7.2 cm). Multiple metallic prostate biopsy clips noted. Other: None Musculoskeletal: There is multilevel degenerative changes of the spine. No acute osseous pathology. IMPRESSION: 1. No acute intra-abdominal or pelvic pathology. No bowel obstruction or active inflammation. Normal appendix. 2. Small scattered colonic and terminal ileum diverticulum without active inflammation. 3. Focal lobulated prominence of the superior pole of the spleen, incompletely characterized. CT with IV contrast or MRI may provide better characterization if clinically indicated. 4. Bilateral renal atrophy.  No hydronephrosis or nephrolithiasis. 5.  Aortic Atherosclerosis (ICD10-I70.0). Electronically Signed   By: Anner Crete M.D.   On: 10/19/2016 00:12   Dg Chest 2 View  Result Date: 10/18/2016 CLINICAL DATA:  Gastroesophageal reflux. Belching after eating. Smoker. Prostate cancer. EXAM: CHEST  2 VIEW COMPARISON:  Chest CT dated 05/05/2009. Chest radiographs dated 07/12/2004. FINDINGS: Normal sized heart. Stable minimal linear scarring at the left lung base. Otherwise, clear lungs. Aortic arch calcifications. Thoracic spine degenerative changes. IMPRESSION: No acute abnormality. Electronically Signed   By: Claudie Revering M.D.   On: 10/18/2016 21:03   US Renal  Result Date: 10/20/2016 CLINICAL DATA:  CKD stage 5. Additional history of prostate cancer, renal insufficiency. EXAM: RENAL / URINARY TRACT ULTRASOUND COMPLETE COMPARISON:  None. FINDINGS: Right Kidney: Length: 8 cm. Cortex is diffusely echogenic. Small cyst within the upper pole measures 11 mm. No suspicious mass or hydronephrosis. Left Kidney: Length: 9.8 cm. Cortex is diffusely echogenic. No mass or hydronephrosis. Bladder: Appears normal for degree of bladder distention.  Two echogenic masslike areas within the spleen, measuring 4.2 cm and 2.6 cm respectively. IMPRESSION: 1. Both kidneys are echogenic indicating chronic medical renal disease. No suspicious mass or hydronephrosis. 2. Bladder appears normal. 3. Two echogenic masslike areas within the spleen, measuring 4.2 and 2.6 cm respectively. These may represent benign hemangiomas. Other neoplastic process not excluded. If renal function or dialysis allows, consider MRI with contrast for further characterization. Electronically Signed   By: Franki Cabot M.D.   On: 10/20/2016 11:57   Nm Myocar Multi W/spect W/wall Motion / Ef  Result Date: 10/21/2016  Downsloping ST segment depression ST segment depression of 1 mm was noted during stress in the II, III, aVF, V4, V5 and V6 leads, beginning at 0 minutes of stress, and returning to baseline after 5-9 minutes of recovery.  Defect 1: There is a large defect of severe severity.  Findings consistent with ischemia.  This is a high risk study.  Nuclear stress EF: 40%.  Large size, severe intensity reversible (SDS 7) anterior, apical and inferolateral perfusion defect suggestive of ischemia. LVEF 40% with inferolateral wall hypokinesis. This is a high risk study.   Dg Chest Port 1 View  Result Date: 10/22/2016 CLINICAL DATA:  Dialysis catheter insertion. EXAM: PORTABLE CHEST 1 VIEW COMPARISON:  10/18/2016 FINDINGS: Right jugular dialysis catheter has been placed. Catheter tip in the region of the right atrium. Hazy densities in the central aspects of the chest compatible with mild edema. Negative for a pneumothorax. Heart size is within normal limits. Atherosclerotic calcifications at the aortic arch. IMPRESSION: Dialysis catheter tip in the upper right atrium region. Negative for pneumothorax. Central vascular congestion or mild edema. Electronically Signed   By: Markus Daft M.D.   On: 10/22/2016 12:56   Dg Fluoro Guide Cv Line-no Report  Result Date: 10/22/2016 Fluoroscopy was  utilized by the requesting physician.  No radiographic interpretation.    Microbiology: Recent Results (from the past 240 hour(s))  MRSA PCR Screening     Status: None   Collection Time: 10/19/16  5:20 AM  Result Value Ref Range Status   MRSA by PCR NEGATIVE NEGATIVE Final    Comment:        The GeneXpert MRSA Assay (FDA approved for NASAL specimens only), is one component of a comprehensive MRSA colonization surveillance program. It is not intended to diagnose MRSA infection nor to guide or monitor treatment for MRSA infections.   C difficile quick scan w PCR reflex     Status: None   Collection Time: 10/24/16  1:18 AM  Result Value Ref Range Status   C Diff antigen NEGATIVE NEGATIVE Final   C Diff toxin NEGATIVE NEGATIVE Final   C Diff interpretation No C. difficile detected.  Final     Labs: Basic Metabolic Panel:  Recent Labs Lab 10/23/16 0613 10/24/16 0303 10/24/16 1955 10/25/16 0257 10/26/16 0715  NA 140 141 141 138 139  K 4.1 4.3 4.4 3.6 3.9  CL 111 112* 114* 103 106  CO2 19* 19* 19* 25 25  GLUCOSE 84 98 109* 93 96  BUN 17 19 25* 8 17  CREATININE 3.66* 4.40* 4.67* 2.81* 4.34*  CALCIUM 7.7* 7.8* 7.8* 8.0* 8.1*  PHOS 3.0 3.0 3.0 2.4* 2.5   Liver Function Tests:  Recent Labs Lab 10/20/16 0700 10/21/16 0607  10/23/16 0613 10/24/16 0303 10/24/16 1955 10/25/16 0257 10/26/16 0715  AST 22 19  --   --   --   --   --   --   ALT 11* 9*  --   --   --   --   --   --   ALKPHOS 125 117  --   --   --   --   --   --   BILITOT 0.8 0.7  --   --   --   --   --   --   PROT 6.6 6.1*  --   --   --   --   --   --   ALBUMIN 3.5 3.3*  < > 3.0* 3.1* 3.3* 3.4* 3.3*  < > = values in this interval not displayed. No results for input(s): LIPASE, AMYLASE in the last 168 hours. No results for input(s): AMMONIA in the last 168 hours. CBC:  Recent Labs Lab 10/23/16 0613 10/24/16 0303 10/24/16 1954 10/25/16 0257 10/26/16 0715  WBC 7.2 7.3 7.9 8.5 8.9  HGB 8.6* 9.0*  8.9* 9.2* 8.6*  HCT 26.6* 29.3* 27.9* 28.9* 27.0*  MCV 89.0 90.4 90.9 89.5 90.6  PLT 165 186 177 174 174   Cardiac Enzymes: No results for input(s): CKTOTAL, CKMB, CKMBINDEX, TROPONINI in the last 168 hours. BNP: BNP (last 3 results) No results for input(s): BNP in the last 8760 hours.  ProBNP (last 3 results) No results for input(s): PROBNP in the last 8760 hours.  CBG:  Recent Labs Lab 10/23/16 0743  GLUCAP 82       Signed:  Loyal Holzheimer MD, PhD  [facility] 10/26/2016, 10:13 PM

## 2016-10-26 NOTE — Progress Notes (Signed)
Subjective:  No recurrence of chest pain overnight.  Objective:  Vital Signs in the last 24 hours: BP (!) 159/69 (BP Location: Right Arm)   Pulse (!) 59   Temp 98.5 F (36.9 C) (Oral)   Resp 20   Ht 5\' 7"  (1.702 m)   Wt 89.4 kg (197 lb 1.5 oz)   SpO2 98%   BMI 30.87 kg/m   Physical Exam: Pleasant black male currently in no acute distress with no complaints Lungs:  Clear Cardiac:  Regular rhythm, normal S1 and S2, no S3, 1 to 2/6 murmur Extremities:  No edema present  Intake/Output from previous day: No intake/output data recorded.  Weight Filed Weights   10/26/16 0419 10/26/16 0705 10/26/16 1114  Weight: 91.2 kg (201 lb) 90.2 kg (198 lb 13.7 oz) 89.4 kg (197 lb 1.5 oz)    Lab Results: Basic Metabolic Panel:  Recent Labs  10/25/16 0257 10/26/16 0715  NA 138 139  K 3.6 3.9  CL 103 106  CO2 25 25  GLUCOSE 93 96  BUN 8 17  CREATININE 2.81* 4.34*   CBC:  Recent Labs  10/25/16 0257 10/26/16 0715  WBC 8.5 8.9  HGB 9.2* 8.6*  HCT 28.9* 27.0*  MCV 89.5 90.6  PLT 174 174   Telemetry: Personally reviewed.  Sinus rhythm  Assessment/Plan:  1.  Severe coronary artery disease with severe LAD stenosis and total occlusion of the RCA and circumflex not good candidate for bypass and planned PCI at a later date because of recurrent GI bleeding issues 2.  GI bleeding 3.  End-stage renal disease 4.  Hypertensive heart disease  Recommendations:  Detailed note yesterday and outlines plans as well as Dr. Sanda Klein note today.  Possible discharge after dialysis with follow-up at the [facility] clinic on August 21.      W. Doristine Church  MD [facility] Cardiology  10/26/2016, 12:22 PM

## 2016-10-28 NOTE — Telephone Encounter (Signed)
No answer. Left message to call back.   

## 2016-10-29 DIAGNOSIS — D631 Anemia in chronic kidney disease: Secondary | ICD-10-CM | POA: Diagnosis not present

## 2016-10-29 DIAGNOSIS — Z4901 Encounter for fitting and adjustment of extracorporeal dialysis catheter: Secondary | ICD-10-CM | POA: Diagnosis not present

## 2016-10-29 DIAGNOSIS — N186 End stage renal disease: Secondary | ICD-10-CM | POA: Diagnosis not present

## 2016-10-29 DIAGNOSIS — D509 Iron deficiency anemia, unspecified: Secondary | ICD-10-CM | POA: Diagnosis not present

## 2016-10-29 DIAGNOSIS — N2581 Secondary hyperparathyroidism of renal origin: Secondary | ICD-10-CM | POA: Diagnosis not present

## 2016-10-29 NOTE — Telephone Encounter (Signed)
Left voicemail message to call back  

## 2016-10-30 ENCOUNTER — Telehealth: Payer: Self-pay | Admitting: Cardiovascular Disease

## 2016-10-30 ENCOUNTER — Telehealth: Payer: Self-pay | Admitting: Internal Medicine

## 2016-10-30 DIAGNOSIS — N186 End stage renal disease: Secondary | ICD-10-CM | POA: Diagnosis not present

## 2016-10-30 DIAGNOSIS — I21A1 Myocardial infarction type 2: Secondary | ICD-10-CM | POA: Diagnosis not present

## 2016-10-30 DIAGNOSIS — D631 Anemia in chronic kidney disease: Secondary | ICD-10-CM | POA: Diagnosis not present

## 2016-10-30 DIAGNOSIS — K5520 Angiodysplasia of colon without hemorrhage: Secondary | ICD-10-CM | POA: Diagnosis not present

## 2016-10-30 DIAGNOSIS — I4891 Unspecified atrial fibrillation: Secondary | ICD-10-CM | POA: Diagnosis not present

## 2016-10-30 NOTE — Telephone Encounter (Signed)
This should be addressed by GI. See their phone note from today.

## 2016-10-30 NOTE — Telephone Encounter (Signed)
Spoke with home health nurse and let her know that we would need to defer to GI physicians. She states that she called dialysis center and they sent in something for the patient. She was appreciative for the call back and had no further questions at this time.

## 2016-10-30 NOTE — Telephone Encounter (Signed)
Spoke with Amy home health nurse and she would like some recommendations because patient has had no bowel movements since being home. Advised her to check with patients PCP or GI physician and she states that he does not have a PCP and the GI physician deferred to [place]. Let her know that I would send message to Dr. Fletcher Anon but that I was not sure if we could assist with this. She verbalized understanding and had no further questions at this time. Let her know that we would give her a call back with any recommendations.

## 2016-10-30 NOTE — Telephone Encounter (Signed)
Home Health nurse Amy called  Pt was admitted to home today Hasn't had any bowel movements  Would like some recommendations Please call

## 2016-10-30 NOTE — Telephone Encounter (Signed)
Patient contacted regarding discharge from [facility] on 10/26/16.  Patient understands to follow up with provider Ignacia Bayley on 11/06/16 at 2:00 PM at [facility]. Patient understands discharge instructions? Yes Patient understands medications and regiment? Yes Patient understands to bring all medications to this visit? Yes  Spoke with patient and then he handed phone to [place] his daughter. Reviewed all information with her as well and confirmed upcoming appointments to include 12/17/16 at 1:40 PM with Dr. Fletcher Anon. They verbalized understanding with no further questions at this time.

## 2016-10-30 NOTE — Telephone Encounter (Signed)
Beth this pt was consulted by Dr Silverio Decamp on 10/19/16.  Dr Carlean Purl did a colon on 10/21/16.  Home health is calling for orders for constipation.  I guess since Dr Silverio Decamp consulted first he becomes her patient correct?

## 2016-10-31 DIAGNOSIS — D631 Anemia in chronic kidney disease: Secondary | ICD-10-CM | POA: Diagnosis not present

## 2016-10-31 DIAGNOSIS — N2581 Secondary hyperparathyroidism of renal origin: Secondary | ICD-10-CM | POA: Diagnosis not present

## 2016-10-31 DIAGNOSIS — N186 End stage renal disease: Secondary | ICD-10-CM | POA: Diagnosis not present

## 2016-10-31 DIAGNOSIS — Z4901 Encounter for fitting and adjustment of extracorporeal dialysis catheter: Secondary | ICD-10-CM | POA: Diagnosis not present

## 2016-10-31 DIAGNOSIS — D509 Iron deficiency anemia, unspecified: Secondary | ICD-10-CM | POA: Diagnosis not present

## 2016-10-31 NOTE — Telephone Encounter (Signed)
Please send Rx for Miralax 1 capful BID, titrate to have 1-2 soft bowel movements daily. Thanks

## 2016-10-31 NOTE — Telephone Encounter (Signed)
Spoke with Amy of [facility] She contacted the patient's dialysis team. Dr Mikeal Hawthorne has given her instructions to use Miralax.

## 2016-11-01 DIAGNOSIS — I21A1 Myocardial infarction type 2: Secondary | ICD-10-CM | POA: Diagnosis not present

## 2016-11-01 DIAGNOSIS — D631 Anemia in chronic kidney disease: Secondary | ICD-10-CM | POA: Diagnosis not present

## 2016-11-01 DIAGNOSIS — I4891 Unspecified atrial fibrillation: Secondary | ICD-10-CM | POA: Diagnosis not present

## 2016-11-01 DIAGNOSIS — N186 End stage renal disease: Secondary | ICD-10-CM | POA: Diagnosis not present

## 2016-11-01 DIAGNOSIS — K5520 Angiodysplasia of colon without hemorrhage: Secondary | ICD-10-CM | POA: Diagnosis not present

## 2016-11-02 DIAGNOSIS — D509 Iron deficiency anemia, unspecified: Secondary | ICD-10-CM | POA: Diagnosis not present

## 2016-11-02 DIAGNOSIS — N2581 Secondary hyperparathyroidism of renal origin: Secondary | ICD-10-CM | POA: Diagnosis not present

## 2016-11-02 DIAGNOSIS — N186 End stage renal disease: Secondary | ICD-10-CM | POA: Diagnosis not present

## 2016-11-02 DIAGNOSIS — Z4901 Encounter for fitting and adjustment of extracorporeal dialysis catheter: Secondary | ICD-10-CM | POA: Diagnosis not present

## 2016-11-02 DIAGNOSIS — D631 Anemia in chronic kidney disease: Secondary | ICD-10-CM | POA: Diagnosis not present

## 2016-11-04 DIAGNOSIS — K5520 Angiodysplasia of colon without hemorrhage: Secondary | ICD-10-CM | POA: Diagnosis not present

## 2016-11-04 DIAGNOSIS — I21A1 Myocardial infarction type 2: Secondary | ICD-10-CM | POA: Diagnosis not present

## 2016-11-04 DIAGNOSIS — N186 End stage renal disease: Secondary | ICD-10-CM | POA: Diagnosis not present

## 2016-11-04 DIAGNOSIS — I4891 Unspecified atrial fibrillation: Secondary | ICD-10-CM | POA: Diagnosis not present

## 2016-11-04 DIAGNOSIS — D631 Anemia in chronic kidney disease: Secondary | ICD-10-CM | POA: Diagnosis not present

## 2016-11-05 DIAGNOSIS — D631 Anemia in chronic kidney disease: Secondary | ICD-10-CM | POA: Diagnosis not present

## 2016-11-05 DIAGNOSIS — N186 End stage renal disease: Secondary | ICD-10-CM | POA: Diagnosis not present

## 2016-11-05 DIAGNOSIS — N2581 Secondary hyperparathyroidism of renal origin: Secondary | ICD-10-CM | POA: Diagnosis not present

## 2016-11-05 DIAGNOSIS — D509 Iron deficiency anemia, unspecified: Secondary | ICD-10-CM | POA: Diagnosis not present

## 2016-11-05 DIAGNOSIS — Z4901 Encounter for fitting and adjustment of extracorporeal dialysis catheter: Secondary | ICD-10-CM | POA: Diagnosis not present

## 2016-11-06 ENCOUNTER — Ambulatory Visit: Payer: Medicare Other | Admitting: Nurse Practitioner

## 2016-11-06 DIAGNOSIS — K5520 Angiodysplasia of colon without hemorrhage: Secondary | ICD-10-CM | POA: Diagnosis not present

## 2016-11-06 DIAGNOSIS — I21A1 Myocardial infarction type 2: Secondary | ICD-10-CM | POA: Diagnosis not present

## 2016-11-06 DIAGNOSIS — I4891 Unspecified atrial fibrillation: Secondary | ICD-10-CM | POA: Diagnosis not present

## 2016-11-06 DIAGNOSIS — D631 Anemia in chronic kidney disease: Secondary | ICD-10-CM | POA: Diagnosis not present

## 2016-11-06 DIAGNOSIS — N186 End stage renal disease: Secondary | ICD-10-CM | POA: Diagnosis not present

## 2016-11-07 DIAGNOSIS — D509 Iron deficiency anemia, unspecified: Secondary | ICD-10-CM | POA: Diagnosis not present

## 2016-11-07 DIAGNOSIS — Z4901 Encounter for fitting and adjustment of extracorporeal dialysis catheter: Secondary | ICD-10-CM | POA: Diagnosis not present

## 2016-11-07 DIAGNOSIS — K5520 Angiodysplasia of colon without hemorrhage: Secondary | ICD-10-CM | POA: Diagnosis not present

## 2016-11-07 DIAGNOSIS — N186 End stage renal disease: Secondary | ICD-10-CM | POA: Diagnosis not present

## 2016-11-07 DIAGNOSIS — I4891 Unspecified atrial fibrillation: Secondary | ICD-10-CM | POA: Diagnosis not present

## 2016-11-07 DIAGNOSIS — I21A1 Myocardial infarction type 2: Secondary | ICD-10-CM | POA: Diagnosis not present

## 2016-11-07 DIAGNOSIS — D631 Anemia in chronic kidney disease: Secondary | ICD-10-CM | POA: Diagnosis not present

## 2016-11-07 DIAGNOSIS — N2581 Secondary hyperparathyroidism of renal origin: Secondary | ICD-10-CM | POA: Diagnosis not present

## 2016-11-08 DIAGNOSIS — K5520 Angiodysplasia of colon without hemorrhage: Secondary | ICD-10-CM | POA: Diagnosis not present

## 2016-11-08 DIAGNOSIS — N186 End stage renal disease: Secondary | ICD-10-CM | POA: Diagnosis not present

## 2016-11-08 DIAGNOSIS — I21A1 Myocardial infarction type 2: Secondary | ICD-10-CM | POA: Diagnosis not present

## 2016-11-08 DIAGNOSIS — I4891 Unspecified atrial fibrillation: Secondary | ICD-10-CM | POA: Diagnosis not present

## 2016-11-08 DIAGNOSIS — D631 Anemia in chronic kidney disease: Secondary | ICD-10-CM | POA: Diagnosis not present

## 2016-11-09 DIAGNOSIS — N186 End stage renal disease: Secondary | ICD-10-CM | POA: Diagnosis not present

## 2016-11-09 DIAGNOSIS — D631 Anemia in chronic kidney disease: Secondary | ICD-10-CM | POA: Diagnosis not present

## 2016-11-09 DIAGNOSIS — N2581 Secondary hyperparathyroidism of renal origin: Secondary | ICD-10-CM | POA: Diagnosis not present

## 2016-11-09 DIAGNOSIS — D509 Iron deficiency anemia, unspecified: Secondary | ICD-10-CM | POA: Diagnosis not present

## 2016-11-09 DIAGNOSIS — Z4901 Encounter for fitting and adjustment of extracorporeal dialysis catheter: Secondary | ICD-10-CM | POA: Diagnosis not present

## 2016-11-11 DIAGNOSIS — I4891 Unspecified atrial fibrillation: Secondary | ICD-10-CM | POA: Diagnosis not present

## 2016-11-11 DIAGNOSIS — I21A1 Myocardial infarction type 2: Secondary | ICD-10-CM | POA: Diagnosis not present

## 2016-11-11 DIAGNOSIS — D631 Anemia in chronic kidney disease: Secondary | ICD-10-CM | POA: Diagnosis not present

## 2016-11-11 DIAGNOSIS — K5520 Angiodysplasia of colon without hemorrhage: Secondary | ICD-10-CM | POA: Diagnosis not present

## 2016-11-11 DIAGNOSIS — N186 End stage renal disease: Secondary | ICD-10-CM | POA: Diagnosis not present

## 2016-11-12 DIAGNOSIS — N2581 Secondary hyperparathyroidism of renal origin: Secondary | ICD-10-CM | POA: Diagnosis not present

## 2016-11-12 DIAGNOSIS — I21A1 Myocardial infarction type 2: Secondary | ICD-10-CM | POA: Diagnosis not present

## 2016-11-12 DIAGNOSIS — N186 End stage renal disease: Secondary | ICD-10-CM | POA: Diagnosis not present

## 2016-11-12 DIAGNOSIS — K5520 Angiodysplasia of colon without hemorrhage: Secondary | ICD-10-CM | POA: Diagnosis not present

## 2016-11-12 DIAGNOSIS — D509 Iron deficiency anemia, unspecified: Secondary | ICD-10-CM | POA: Diagnosis not present

## 2016-11-12 DIAGNOSIS — I4891 Unspecified atrial fibrillation: Secondary | ICD-10-CM | POA: Diagnosis not present

## 2016-11-12 DIAGNOSIS — D631 Anemia in chronic kidney disease: Secondary | ICD-10-CM | POA: Diagnosis not present

## 2016-11-12 DIAGNOSIS — Z4901 Encounter for fitting and adjustment of extracorporeal dialysis catheter: Secondary | ICD-10-CM | POA: Diagnosis not present

## 2016-11-13 DIAGNOSIS — K5520 Angiodysplasia of colon without hemorrhage: Secondary | ICD-10-CM | POA: Diagnosis not present

## 2016-11-13 DIAGNOSIS — I4891 Unspecified atrial fibrillation: Secondary | ICD-10-CM | POA: Diagnosis not present

## 2016-11-13 DIAGNOSIS — D631 Anemia in chronic kidney disease: Secondary | ICD-10-CM | POA: Diagnosis not present

## 2016-11-13 DIAGNOSIS — N186 End stage renal disease: Secondary | ICD-10-CM | POA: Diagnosis not present

## 2016-11-13 DIAGNOSIS — I21A1 Myocardial infarction type 2: Secondary | ICD-10-CM | POA: Diagnosis not present

## 2016-11-14 DIAGNOSIS — D509 Iron deficiency anemia, unspecified: Secondary | ICD-10-CM | POA: Diagnosis not present

## 2016-11-14 DIAGNOSIS — K5520 Angiodysplasia of colon without hemorrhage: Secondary | ICD-10-CM | POA: Diagnosis not present

## 2016-11-14 DIAGNOSIS — Z4901 Encounter for fitting and adjustment of extracorporeal dialysis catheter: Secondary | ICD-10-CM | POA: Diagnosis not present

## 2016-11-14 DIAGNOSIS — I4891 Unspecified atrial fibrillation: Secondary | ICD-10-CM | POA: Diagnosis not present

## 2016-11-14 DIAGNOSIS — I21A1 Myocardial infarction type 2: Secondary | ICD-10-CM | POA: Diagnosis not present

## 2016-11-14 DIAGNOSIS — N2581 Secondary hyperparathyroidism of renal origin: Secondary | ICD-10-CM | POA: Diagnosis not present

## 2016-11-14 DIAGNOSIS — N186 End stage renal disease: Secondary | ICD-10-CM | POA: Diagnosis not present

## 2016-11-14 DIAGNOSIS — D631 Anemia in chronic kidney disease: Secondary | ICD-10-CM | POA: Diagnosis not present

## 2016-11-15 DIAGNOSIS — I129 Hypertensive chronic kidney disease with stage 1 through stage 4 chronic kidney disease, or unspecified chronic kidney disease: Secondary | ICD-10-CM | POA: Diagnosis not present

## 2016-11-15 DIAGNOSIS — N186 End stage renal disease: Secondary | ICD-10-CM | POA: Diagnosis not present

## 2016-11-15 DIAGNOSIS — Z992 Dependence on renal dialysis: Secondary | ICD-10-CM | POA: Diagnosis not present

## 2016-11-16 DIAGNOSIS — N186 End stage renal disease: Secondary | ICD-10-CM | POA: Diagnosis not present

## 2016-11-16 DIAGNOSIS — Z4901 Encounter for fitting and adjustment of extracorporeal dialysis catheter: Secondary | ICD-10-CM | POA: Diagnosis not present

## 2016-11-16 DIAGNOSIS — N2581 Secondary hyperparathyroidism of renal origin: Secondary | ICD-10-CM | POA: Diagnosis not present

## 2016-11-19 DIAGNOSIS — Z4901 Encounter for fitting and adjustment of extracorporeal dialysis catheter: Secondary | ICD-10-CM | POA: Diagnosis not present

## 2016-11-19 DIAGNOSIS — N186 End stage renal disease: Secondary | ICD-10-CM | POA: Diagnosis not present

## 2016-11-19 DIAGNOSIS — N2581 Secondary hyperparathyroidism of renal origin: Secondary | ICD-10-CM | POA: Diagnosis not present

## 2016-11-20 DIAGNOSIS — D631 Anemia in chronic kidney disease: Secondary | ICD-10-CM | POA: Diagnosis not present

## 2016-11-20 DIAGNOSIS — I21A1 Myocardial infarction type 2: Secondary | ICD-10-CM | POA: Diagnosis not present

## 2016-11-20 DIAGNOSIS — I4891 Unspecified atrial fibrillation: Secondary | ICD-10-CM | POA: Diagnosis not present

## 2016-11-20 DIAGNOSIS — K5520 Angiodysplasia of colon without hemorrhage: Secondary | ICD-10-CM | POA: Diagnosis not present

## 2016-11-20 DIAGNOSIS — N186 End stage renal disease: Secondary | ICD-10-CM | POA: Diagnosis not present

## 2016-11-21 DIAGNOSIS — N186 End stage renal disease: Secondary | ICD-10-CM | POA: Diagnosis not present

## 2016-11-21 DIAGNOSIS — D509 Iron deficiency anemia, unspecified: Secondary | ICD-10-CM | POA: Diagnosis not present

## 2016-11-21 DIAGNOSIS — N2581 Secondary hyperparathyroidism of renal origin: Secondary | ICD-10-CM | POA: Diagnosis not present

## 2016-11-21 DIAGNOSIS — D631 Anemia in chronic kidney disease: Secondary | ICD-10-CM | POA: Diagnosis not present

## 2016-11-21 DIAGNOSIS — Z4901 Encounter for fitting and adjustment of extracorporeal dialysis catheter: Secondary | ICD-10-CM | POA: Diagnosis not present

## 2016-11-22 DIAGNOSIS — I21A1 Myocardial infarction type 2: Secondary | ICD-10-CM | POA: Diagnosis not present

## 2016-11-22 DIAGNOSIS — I4891 Unspecified atrial fibrillation: Secondary | ICD-10-CM | POA: Diagnosis not present

## 2016-11-22 DIAGNOSIS — D631 Anemia in chronic kidney disease: Secondary | ICD-10-CM | POA: Diagnosis not present

## 2016-11-22 DIAGNOSIS — N186 End stage renal disease: Secondary | ICD-10-CM | POA: Diagnosis not present

## 2016-11-22 DIAGNOSIS — K5520 Angiodysplasia of colon without hemorrhage: Secondary | ICD-10-CM | POA: Diagnosis not present

## 2016-11-23 DIAGNOSIS — D509 Iron deficiency anemia, unspecified: Secondary | ICD-10-CM | POA: Diagnosis not present

## 2016-11-23 DIAGNOSIS — D631 Anemia in chronic kidney disease: Secondary | ICD-10-CM | POA: Diagnosis not present

## 2016-11-23 DIAGNOSIS — Z4901 Encounter for fitting and adjustment of extracorporeal dialysis catheter: Secondary | ICD-10-CM | POA: Diagnosis not present

## 2016-11-23 DIAGNOSIS — N186 End stage renal disease: Secondary | ICD-10-CM | POA: Diagnosis not present

## 2016-11-23 DIAGNOSIS — N2581 Secondary hyperparathyroidism of renal origin: Secondary | ICD-10-CM | POA: Diagnosis not present

## 2016-11-25 DIAGNOSIS — I21A1 Myocardial infarction type 2: Secondary | ICD-10-CM | POA: Diagnosis not present

## 2016-11-25 DIAGNOSIS — N186 End stage renal disease: Secondary | ICD-10-CM | POA: Diagnosis not present

## 2016-11-25 DIAGNOSIS — D631 Anemia in chronic kidney disease: Secondary | ICD-10-CM | POA: Diagnosis not present

## 2016-11-25 DIAGNOSIS — K5520 Angiodysplasia of colon without hemorrhage: Secondary | ICD-10-CM | POA: Diagnosis not present

## 2016-11-25 DIAGNOSIS — I4891 Unspecified atrial fibrillation: Secondary | ICD-10-CM | POA: Diagnosis not present

## 2016-11-26 ENCOUNTER — Ambulatory Visit (INDEPENDENT_AMBULATORY_CARE_PROVIDER_SITE_OTHER): Payer: Medicare Other | Admitting: Nurse Practitioner

## 2016-11-26 ENCOUNTER — Encounter: Payer: Self-pay | Admitting: Nurse Practitioner

## 2016-11-26 ENCOUNTER — Telehealth: Payer: Self-pay | Admitting: *Deleted

## 2016-11-26 ENCOUNTER — Other Ambulatory Visit: Payer: Self-pay | Admitting: Nurse Practitioner

## 2016-11-26 VITALS — BP 128/60 | HR 80 | Ht 66.0 in | Wt 190.2 lb

## 2016-11-26 DIAGNOSIS — I739 Peripheral vascular disease, unspecified: Secondary | ICD-10-CM | POA: Diagnosis not present

## 2016-11-26 DIAGNOSIS — N2581 Secondary hyperparathyroidism of renal origin: Secondary | ICD-10-CM | POA: Diagnosis not present

## 2016-11-26 DIAGNOSIS — N186 End stage renal disease: Secondary | ICD-10-CM | POA: Diagnosis not present

## 2016-11-26 DIAGNOSIS — I10 Essential (primary) hypertension: Secondary | ICD-10-CM

## 2016-11-26 DIAGNOSIS — Z720 Tobacco use: Secondary | ICD-10-CM | POA: Diagnosis not present

## 2016-11-26 DIAGNOSIS — I25118 Atherosclerotic heart disease of native coronary artery with other forms of angina pectoris: Secondary | ICD-10-CM | POA: Diagnosis not present

## 2016-11-26 DIAGNOSIS — I214 Non-ST elevation (NSTEMI) myocardial infarction: Secondary | ICD-10-CM | POA: Diagnosis not present

## 2016-11-26 DIAGNOSIS — Z4901 Encounter for fitting and adjustment of extracorporeal dialysis catheter: Secondary | ICD-10-CM | POA: Diagnosis not present

## 2016-11-26 DIAGNOSIS — I70219 Atherosclerosis of native arteries of extremities with intermittent claudication, unspecified extremity: Secondary | ICD-10-CM | POA: Diagnosis not present

## 2016-11-26 DIAGNOSIS — I248 Other forms of acute ischemic heart disease: Secondary | ICD-10-CM | POA: Diagnosis not present

## 2016-11-26 DIAGNOSIS — D631 Anemia in chronic kidney disease: Secondary | ICD-10-CM | POA: Diagnosis not present

## 2016-11-26 DIAGNOSIS — E785 Hyperlipidemia, unspecified: Secondary | ICD-10-CM | POA: Diagnosis not present

## 2016-11-26 DIAGNOSIS — D509 Iron deficiency anemia, unspecified: Secondary | ICD-10-CM | POA: Diagnosis not present

## 2016-11-26 DIAGNOSIS — K922 Gastrointestinal hemorrhage, unspecified: Secondary | ICD-10-CM

## 2016-11-26 MED ORDER — CARVEDILOL 3.125 MG PO TABS: 3.1250 mg | ORAL_TABLET | Freq: Two times a day (BID) | ORAL | 3 refills | Status: DC

## 2016-11-26 MED ORDER — HYDRALAZINE HCL 50 MG PO TABS: 50.0000 mg | ORAL_TABLET | Freq: Three times a day (TID) | ORAL | 3 refills | Status: DC

## 2016-11-26 MED ORDER — CLOPIDOGREL BISULFATE 75 MG PO TABS: 75.0000 mg | ORAL_TABLET | Freq: Every day | ORAL | 3 refills | Status: DC

## 2016-11-26 MED ORDER — AMLODIPINE BESYLATE 10 MG PO TABS: 10.0000 mg | ORAL_TABLET | Freq: Every day | ORAL | 3 refills | Status: DC

## 2016-11-26 MED ORDER — ATORVASTATIN CALCIUM 80 MG PO TABS: 80.0000 mg | ORAL_TABLET | Freq: Every day | ORAL | 3 refills | Status: DC

## 2016-11-26 NOTE — Progress Notes (Signed)
So he finally showed up! Thanks, [employer]

## 2016-11-26 NOTE — Progress Notes (Signed)
Office Visit    Patient Name: Roger [name] Date of Encounter: 11/26/2016  Primary Care Provider:  [name], MD Primary Cardiologist:  Jerilynn Mages. Fletcher Anon, MD   Chief Complaint    76 year old male with a history of peripheral arterial disease, tobacco abuse, end-stage renal disease, anemia, paroxysmal atrial fibrillation, and recent GI bleed as well as non-STEMI with finding of multivessel coronary artery disease, who presents for follow-up.  Past Medical History    Past Medical History:  Diagnosis Date  . Anemia   . CAD (coronary artery disease)    a. NSTEMI in setting of anemia; b. 10/2016 MV: inflat ST dep with large, reversible ant, apical, inflat defect; c. 10/2016 Cath: LM nl, LAD 40p, 63m, D1 70, D2 80, RI min irregs, LCX 100ost, RCA 100p, 143m/d, RPDA fills via collats from LAD, RPAV small-->Initial conservative Rx in setting of GIB w/ plan for PCI LAD if H/H stable on ASA/Plavix.  . Diastolic dysfunction    a. 10/2016 Echo: EF 55-60%, no rwma, Gr2 DD, triv MR, mildly dil LA.  Marland Kitchen ESRD (end stage renal disease) ([place])    a. 10/2016 HD catheter insterted and HD initiated; b. Pending permanent HD access folllowing Cardiac Revascularization.  Marland Kitchen GERD (gastroesophageal reflux disease)   . GIB (gastrointestinal bleeding)    a. 10/2016 3u PRBCs - EGD/Colonoscopy: angiodysplasia w/ diverticulosis. No apparent bleeding. Polypecotmy->tubular adenomas.  . Gout   . History of DVT (deep vein thrombosis)   . Iron deficiency anemia   . PAF (paroxysmal atrial fibrillation) ([place])   . Peripheral vascular disease ([place])   . Prostate cancer ([place])    a. s/p seed implantation.  . Tobacco abuse    Past Surgical History:  Procedure Laterality Date  . COLONOSCOPY WITH PROPOFOL N/A 10/21/2016   Procedure: COLONOSCOPY WITH PROPOFOL;  Surgeon: Gatha Mayer, MD;  Location: [facility] ENDOSCOPY;  Service: Endoscopy;  Laterality: N/A;  . ESOPHAGOGASTRODUODENOSCOPY N/A 10/21/2016   Procedure: ESOPHAGOGASTRODUODENOSCOPY  (EGD);  Surgeon: Gatha Mayer, MD;  Location: [facility] ENDOSCOPY;  Service: Endoscopy;  Laterality: N/A;  . INSERTION OF DIALYSIS CATHETER Right 10/22/2016   Procedure: INSERTION OF TUNNELED DIALYSIS CATHETER;  Surgeon: Elam Dutch, MD;  Location: [place];  Service: Vascular;  Laterality: Right;  . LEFT HEART CATH AND CORONARY ANGIOGRAPHY N/A 10/23/2016   Procedure: LEFT HEART CATH AND CORONARY ANGIOGRAPHY;  Surgeon: Wellington Hampshire, MD;  Location: [place] CV LAB;  Service: Cardiovascular;  Laterality: N/A;    Allergies  No Known Allergies  History of Present Illness    76 year old male with the above complex past medical history including peripheral arterial disease, tobacco abuse, chronic kidney disease, anemia, gout, GERD, paroxysmal atrial fibrillation, history of DVT, and prostate cancer. Patient was recently admitted to [name] on August 4 with epigastric pain and diaphoresis. He also been having melena for approximately 2-1/2 months. He was found to be anemic with a hemoglobin of 6.2. His creatinine was elevated at 5.7. Troponin was elevated at 3.4. ECG showed inferolateral ST segment depressions. He required 3 units of packed red blood cells. He was seen by cardiology and stress testing was abnormal and suggestive of anterior, apical, and inferolateral ischemia. Echo showed normal LV function. Catheterization showed total occlusions of the left circumflex and right coronary arteries. There were left to right collaterals. The LAD had a 70% stenosis in the midsection of the vessel. In the setting of GI bleed, it was felt that an initial attempt at medical  therapy would be appropriate and that if hemoglobin and hematocrit remained stable while on aspirin and Plavix, PCI of the LAD could be scheduled at a later date. EGD and colonoscopy were performed showing angiodysplasia or diverticulosis of the colon. No bleeding was found. Polypectomy was performed and pathology was consistent with  tubular adenoma. Patient's hemoglobin and hematocrit were stable on aspirin and Plavix. In the setting of acute on chronic renal failure, a dialysis catheter was placed. Hemodialysis was initiated. He was seen by vascular surgery with recommendation for eventual permanent hemodialysis access however, from a cardiovascular standpoint, he was felt to be at least at moderate risk and therefore permanent dialysis access has been deferred until his LAD lesion is addressed.  Since discharge he has been living with his daughter in [place], [place].  Dialysis sessions have been Tuesday, Thursday, and Saturdays in [place] these were recently switched to a center in [place], at least until he is living independently again in [place]. Patient initially says that he has been doing well since discharge and denies chest pain, dyspnea, PND, orthopnea, dizziness, syncope, edema, or early satiety. When asked who is tolerating dialysis however, he notes that he gets "indigestion" approximately 2 hours and every single session, lasting 2 hours, and resolving with completion of dialysis. He has had similar symptoms in the past despite taking PPI and H2 blocker therapy. He has never considered that this might potentially be angina. He has not had any exertional angina. Activity has been fairly limited though he is working with physical therapy. He does have some dyspnea on exertion when participating in physical therapy. He and his daughter are interested in getting him scheduled for LAD percutaneous intervention.  Home Medications    Prior to Admission medications   Medication Sig Start Date End Date Taking? Authorizing Provider  allopurinol (ZYLOPRIM) 100 MG tablet Take 1 tablet (100 mg total) by mouth daily. 10/26/16  Yes Florencia Reasons, MD  amLODipine (NORVASC) 10 MG tablet Take 1 tablet (10 mg total) by mouth daily. 10/26/16  Yes Florencia Reasons, MD  aspirin EC 81 MG tablet Take 1 tablet (81 mg total) by mouth daily. 10/26/16 10/26/17  Yes Florencia Reasons, MD  atorvastatin (LIPITOR) 80 MG tablet Take 1 tablet (80 mg total) by mouth daily at 6 PM. 10/26/16  Yes Florencia Reasons, MD  carvedilol (COREG) 3.125 MG tablet Take 1 tablet (3.125 mg total) by mouth 2 (two) times daily with a meal. 10/26/16  Yes Florencia Reasons, MD  clopidogrel (PLAVIX) 75 MG tablet Take 1 tablet (75 mg total) by mouth daily. 10/26/16  Yes Florencia Reasons, MD  esomeprazole (NEXIUM) 40 MG capsule Take 1 capsule (40 mg total) by mouth daily. 10/26/16 10/26/17 Yes Florencia Reasons, MD  hydrALAZINE (APRESOLINE) 50 MG tablet Take 1 tablet (50 mg total) by mouth every 8 (eight) hours. 10/26/16  Yes Florencia Reasons, MD  multivitamin (RENA-VIT) TABS tablet Take 1 tablet by mouth at bedtime. 10/26/16  Yes Florencia Reasons, MD  ranitidine (ZANTAC) 150 MG tablet Take 150 mg by mouth daily as needed for heartburn.   Yes [provider]   Family History  Problem Relation Age of Onset  . Cancer Mother   . Cancer Father   . Heart attack Father   . Diabetes Father   . Hypertension Father   . Cancer Sister   . Cancer Brother   . Diabetes Brother   . Hypertension Brother   . Hypertension Daughter    Social History   Social  History  . Marital status: Widowed    Spouse name: N/A  . Number of children: N/A  . Years of education: N/A   Occupational History  . Not on file.   Social History Main Topics  . Smoking status: Former Smoker    Packs/day: 0.50    Years: 50.00    Types: Cigarettes    Quit date: 10/18/2016  . Smokeless tobacco: Never Used  . Alcohol use No  . Drug use: No  . Sexual activity: Not on file   Other Topics Concern  . Not on file   Social History Narrative  . No narrative on file    Review of Systems    He has had some substernal chest discomfort associated with dialysis sessions. He also has some dyspnea on exertion when working with physical therapy. He denies PND, orthopnea, dizziness, syncope, edema, or early satiety  All other systems reviewed and are otherwise negative  except as noted above.  Physical Exam    VS:  BP 128/60 (BP Location: Left Arm, Patient Position: Sitting, Cuff Size: Normal)   Pulse 80   Ht 5\' 6"  (1.676 m)   Wt 190 lb 4 oz (86.3 kg)   BMI 30.71 kg/m  , BMI Body mass index is 30.71 kg/m. GEN: Well nourished, well developed, in no acute distress.  HEENT: normal.  Neck: Supple, no JVD, carotid bruits, or masses. Cardiac: RRR, 2/6 systolic ejection murmur at the right upper sternal border, rubs, or gallops. No clubbing, cyanosis, edema.  Radials/DP/PT 2+ and equal bilaterally.  Respiratory:  Respirations regular and unlabored, clear to auscultation bilaterally. GI: Soft, nontender, nondistended, BS + x 4. MS: no deformity or atrophy. Skin: warm and dry, no rash. Neuro:  Strength and sensation are intact. Psych: Normal affect.  Accessory Clinical Findings    ECG - Regular sinus rhythm, 80, first-degree AV block, nonspecific ST and T changes, mildly prolonged QT. No acute changes.  Assessment & Plan    1.  Non-STEMI, subsequent episode of care/coronary artery disease: Patient was recently admitted in the setting of nausea, abdominal pain, and severe anemia with GI bleeding. In that setting, he also had chest pain and troponin elevation. Catheterization showed total occlusions of the left circumflex and right coronary artery with significant disease in the LAD. Stress testing prior to catheterization did show ischemia in the LAD territory. He has been on aspirin and Plavix for the past month and has had some substernal chest discomfort with dialysis sessions which he attributes to GERD despite being on PPI and H2 blocker therapy. He has not had any exertional chest pain but has had dyspnea on exertion. I will follow-up labs today including a CBC, basic metabolic panel, and coagulation studies. Provided that hemoglobin and hematocrit are stable, I will arrange for percutaneous intervention of the LAD with Dr. Fletcher Anon next Wednesday at [facility], as was previously planned. Given prior history of GI bleeding and need for permanent vascular access surgery, it was previously felt that bare-metal stenting would be most appropriate therefore limiting exposure to dual antiplatelet therapy to just another 30 days post procedure.  If H&H returned showing significant anemia, he will likely require holding of Plavix, repeat transfusion, and additional workup prior to percutaneous intervention. Continue statin and beta blocker therapy.  2. End-stage renal disease: Pending AV fistula once cardiac issues have been dealt with and he is off of Plavix. See #1. He is on Tuesday, Thursday, Saturday dialysis and does have some chest  pain with sessions. He will likely need inpatient HD following his percutaneous intervention.  3. Essential hypertension: Blood pressure is stable today on beta blocker, hydralazine, and calcium channel blocker therapy.  4. Hyperlipidemia: Continue high potency statin therapy. LDL was 140 on August 5. He will need follow-up fasting labs and perhaps we can obtain these when he is in the hospital next week.  5. Recent GI bleed: No recent melena. Follow-up CBC today. Next  6. Peripheral arterial disease: His chronic stable bilateral calf claudication. Activity is limited. Previously followed by Dr. Scot Dock. Continue aspirin, Plavix, statin therapy.  7. Tobacco abuse: He has quit.  8. Disposition: Follow-up labs today. Plan on percutaneous intervention of the LAD with probably a bare-metal stent next Wednesday. Follow-up in clinic in 3 weeks.  [name], NP 11/26/2016, 1:13 PM

## 2016-11-26 NOTE — Patient Instructions (Addendum)
Medication Instructions:  Please continue your current medicatons  Labwork: BMET, CBC, PT/INR  Testing/Procedures:  You are scheduled for a Cardiac Catheterization on Wednesday, September 19 with Dr. [name].  1. Please arrive at the [facility] (Main Entrance A) at [facility]: [address] [place], [place] [address] at 8:00 AM (two hours before your procedure to ensure your preparation). Free valet parking service is available.   Special note: Every effort is made to have your procedure done on time. Please understand that emergencies sometimes delay scheduled procedures.  2. Diet: Do not eat or drink anything after midnight prior to your procedure except sips of water to take medications.  3. Labs: Today: CBC, BMET, PT/INR  4. Medication instructions in preparation for your procedure:  On the morning of your procedure, take your Aspirin and any morning medicines NOT listed above.  You may use sips of water.  5. Plan for one night stay--bring personal belongings. 6. Bring a current list of your medications and current insurance cards. 7. You MUST have a responsible person to drive you home. 8. Someone MUST be with you the first 24 hours after you arrive home or your discharge will be delayed. 9. Please wear clothes that are easy to get on and off and wear slip-on shoes.  Thank you for allowing [place] to care for you!   -- [facility]   Follow-Up 3 weeks  If you need a refill on your cardiac medications before your next appointment, please call your pharmacy.   Coronary Angiogram With Stent Coronary angiogram with stent placement is a procedure to widen or open a narrow blood vessel of the heart (coronary artery). Arteries may become blocked by cholesterol buildup (plaques) in the lining or wall. When a coronary artery becomes partially blocked, blood flow to that area decreases. This may lead to chest pain or a heart attack  (myocardial infarction). A stent is a small piece of metal that looks like mesh or a spring. Stent placement may be done as treatment for a heart attack or right after a coronary angiogram in which a blocked artery is found. Let your health care provider know about:  Any allergies you have.  All medicines you are taking, including vitamins, herbs, eye drops, creams, and over-the-counter medicines.  Any problems you or family members have had with anesthetic medicines.  Any blood disorders you have.  Any surgeries you have had.  Any medical conditions you have.  Whether you are pregnant or may be pregnant. What are the risks? Generally, this is a safe procedure. However, problems may occur, including:  Damage to the heart or its blood vessels.  A return of blockage.  Bleeding, infection, or bruising at the insertion site.  A collection of blood under the skin (hematoma) at the insertion site.  A blood clot in another part of the body.  Kidney injury.  Allergic reaction to the dye or contrast that is used.  Bleeding into the abdomen (retroperitoneal bleeding).  What happens before the procedure? Staying hydrated Follow instructions from your health care provider about hydration, which may include:  Up to 2 hours before the procedure - you may continue to drink clear liquids, such as water, clear fruit juice, black coffee, and plain tea.  Eating and drinking restrictions Follow instructions from your health care provider about eating and drinking, which may include:  8 hours before the procedure - stop eating heavy meals or foods such as meat,  fried foods, or fatty foods.  6 hours before the procedure - stop eating light meals or foods, such as toast or cereal.  2 hours before the procedure - stop drinking clear liquids.  Ask your health care provider about:  Changing or stopping your regular medicines. This is especially important if you are taking diabetes  medicines or blood thinners.  Taking medicines such as ibuprofen. These medicines can thin your blood. Do not take these medicines before your procedure if your health care provider instructs you not to. Generally, aspirin is recommended before a procedure of passing a small, thin tube (catheter) through a blood vessel and into the heart (cardiac catheterization).  What happens during the procedure?  An IV tube will be inserted into one of your veins.  You will be given one or more of the following: ? A medicine to help you relax (sedative). ? A medicine to numb the area where the catheter will be inserted into an artery (local anesthetic).  To reduce your risk of infection: ? Your health care team will wash or sanitize their hands. ? Your skin will be washed with soap. ? Hair may be removed from the area where the catheter will be inserted.  Using a guide wire, the catheter will be inserted into an artery. The location may be in your groin, in your wrist, or in the fold of your arm (near your elbow).  A type of X-ray (fluoroscopy) will be used to help guide the catheter to the opening of the arteries in the heart.  A dye will be injected into the catheter, and X-rays will be taken. The dye will help to show where any narrowing or blockages are located in the arteries.  A tiny wire will be guided to the blocked spot, and a balloon will be inflated to make the artery wider.  The stent will be expanded and will crush the plaques into the wall of the vessel. The stent will hold the area open and improve the blood flow. Most stents have a drug coating to reduce the risk of the stent narrowing over time.  The artery may be made wider using a drill, laser, or other tools to remove plaques.  When the blood flow is better, the catheter will be removed. The lining of the artery will grow over the stent, which stays where it was placed. This procedure may vary among health care providers and  hospitals. What happens after the procedure?  If the procedure is done through the leg, you will be kept in bed lying flat for about 6 hours. You will be instructed to not bend and not cross your legs.  The insertion site will be checked frequently.  The pulse in your foot or wrist will be checked frequently.  You may have additional blood tests, X-rays, and a test that records the electrical activity of your heart (electrocardiogram, or ECG). This information is not intended to replace advice given to you by your health care provider. Make sure you discuss any questions you have with your health care provider. Document Released: 09/08/2002 Document Revised: 11/02/2015 Document Reviewed: 10/08/2015 Elsevier Interactive Patient Education  2017 [employer].  Coronary Angiogram With Stent, Care After This sheet gives you information about how to care for yourself after your procedure. Your health care provider may also give you more specific instructions. If you have problems or questions, contact your health care provider. What can I expect after the procedure? After your procedure, it is  common to have:  Bruising in the area where a small, thin tube (catheter) was inserted. This usually fades within 1-2 weeks.  Blood collecting in the tissue (hematoma) that may be painful to the touch. It should usually decrease in size and tenderness within 1-2 weeks.  Follow these instructions at home: Insertion area care  Do not take baths, swim, or use a hot tub until your health care provider approves.  You may shower 24-48 hours after the procedure or as directed by your health care provider.  Follow instructions from your health care provider about how to take care of your incision. Make sure you: ? Wash your hands with soap and water before you change your bandage (dressing). If soap and water are not available, use hand sanitizer. ? Change your dressing as told by your health care  provider. ? Leave stitches (sutures), skin glue, or adhesive strips in place. These skin closures may need to stay in place for 2 weeks or longer. If adhesive strip edges start to loosen and curl up, you may trim the loose edges. Do not remove adhesive strips completely unless your health care provider tells you to do that.  Remove the bandage (dressing) and gently wash the catheter insertion site with plain soap and water.  Pat the area dry with a clean towel. Do not rub the area, because that may cause bleeding.  Do not apply powder or lotion to the incision area.  Check your incision area every day for signs of infection. Check for: ? More redness, swelling, or pain. ? More fluid or blood. ? Warmth. ? Pus or a bad smell. Activity  Do not drive for 24 hours if you were given a medicine to help you relax (sedative).  Do not lift anything that is heavier than 10 lb (4.5 kg) for 5 days after your procedure or as directed by your health care provider.  Ask your health care provider when it is okay for you: ? To return to work or school. ? To resume usual physical activities or sports. ? To resume sexual activity. Eating and drinking  Eat a heart-healthy diet. This should include plenty of fresh fruits and vegetables.  Avoid the following types of food: ? Food that is high in salt. ? Canned or highly processed food. ? Food that is high in saturated fat or sugar. ? [employer].  Limit alcohol intake to no more than 1 drink a day for non-pregnant women and 2 drinks a day for men. One drink equals 12 oz of beer, 5 oz of wine, or 1 oz of hard liquor. Lifestyle  Do not use any products that contain nicotine or tobacco, such as cigarettes and e-cigarettes. If you need help quitting, ask your health care provider.  Take steps to manage and control your weight.  Get regular exercise.  Manage your blood pressure.  Manage other health problems, such as diabetes. General  instructions  Take over-the-counter and prescription medicines only as told by your health care provider. Blood thinners may be prescribed after your procedure to improve blood flow through the stent.  If you need an MRI after your heart stent has been placed, be sure to tell the health care provider who orders the MRI that you have a heart stent.  Keep all follow-up visits as directed by your health care provider. This is important. Contact a health care provider if:  You have a fever.  You have chills.  You have increased bleeding from  the catheter insertion area. Hold pressure on the area. Get help right away if:  You develop chest pain or shortness of breath.  You feel faint or you pass out.  You have unusual pain at the catheter insertion area.  You have redness, warmth, or swelling at the catheter insertion area.  You have drainage (other than a small amount of blood on the dressing) from the catheter insertion area.  The catheter insertion area is bleeding, and the bleeding does not stop after 30 minutes of holding steady pressure on the area.  You develop bleeding from any other place, such as from your rectum. There may be bright red blood in your urine or stool, or it may appear as black, tarry stool. This information is not intended to replace advice given to you by your health care provider. Make sure you discuss any questions you have with your health care provider. Document Released: 09/21/2004 Document Revised: 11/30/2015 Document Reviewed: 11/30/2015 Elsevier Interactive Patient Education  [employer].

## 2016-11-26 NOTE — Telephone Encounter (Signed)
Patient called and made aware that his procedure for 12/04/16 has been moved to 12:00. He should arrive at the hospital at 10 am. He verbalized his understanding.

## 2016-11-27 ENCOUNTER — Telehealth: Payer: Self-pay | Admitting: *Deleted

## 2016-11-27 DIAGNOSIS — I4891 Unspecified atrial fibrillation: Secondary | ICD-10-CM | POA: Diagnosis not present

## 2016-11-27 DIAGNOSIS — Z4901 Encounter for fitting and adjustment of extracorporeal dialysis catheter: Secondary | ICD-10-CM | POA: Diagnosis not present

## 2016-11-27 DIAGNOSIS — K5520 Angiodysplasia of colon without hemorrhage: Secondary | ICD-10-CM | POA: Diagnosis not present

## 2016-11-27 DIAGNOSIS — N2581 Secondary hyperparathyroidism of renal origin: Secondary | ICD-10-CM | POA: Diagnosis not present

## 2016-11-27 DIAGNOSIS — N186 End stage renal disease: Secondary | ICD-10-CM | POA: Diagnosis not present

## 2016-11-27 DIAGNOSIS — D509 Iron deficiency anemia, unspecified: Secondary | ICD-10-CM | POA: Diagnosis not present

## 2016-11-27 DIAGNOSIS — D631 Anemia in chronic kidney disease: Secondary | ICD-10-CM | POA: Diagnosis not present

## 2016-11-27 DIAGNOSIS — I21A1 Myocardial infarction type 2: Secondary | ICD-10-CM | POA: Diagnosis not present

## 2016-11-27 LAB — CBC WITH DIFFERENTIAL/PLATELET
BASOS ABS: 0 10*3/uL (ref 0.0–0.2)
Basos: 0 %
EOS (ABSOLUTE): 0.3 10*3/uL (ref 0.0–0.4)
Eos: 4 %
HEMOGLOBIN: 9.9 g/dL — AB (ref 13.0–17.7)
Hematocrit: 32.2 % — ABNORMAL LOW (ref 37.5–51.0)
IMMATURE GRANS (ABS): 0 10*3/uL (ref 0.0–0.1)
IMMATURE GRANULOCYTES: 0 %
LYMPHS: 22 %
Lymphocytes Absolute: 1.8 10*3/uL (ref 0.7–3.1)
MCH: 27.6 pg (ref 26.6–33.0)
MCHC: 30.7 g/dL — ABNORMAL LOW (ref 31.5–35.7)
MCV: 90 fL (ref 79–97)
MONOCYTES: 4 %
Monocytes Absolute: 0.3 10*3/uL (ref 0.1–0.9)
NEUTROS ABS: 6 10*3/uL (ref 1.4–7.0)
NEUTROS PCT: 70 %
PLATELETS: 238 10*3/uL (ref 150–379)
RBC: 3.59 x10E6/uL — ABNORMAL LOW (ref 4.14–5.80)
RDW: 18.5 % — ABNORMAL HIGH (ref 12.3–15.4)
WBC: 8.5 10*3/uL (ref 3.4–10.8)

## 2016-11-27 LAB — BASIC METABOLIC PANEL
BUN/Creatinine Ratio: 5 — ABNORMAL LOW (ref 10–24)
BUN: 27 mg/dL (ref 8–27)
CALCIUM: 7.9 mg/dL — AB (ref 8.6–10.2)
CHLORIDE: 96 mmol/L (ref 96–106)
CO2: 27 mmol/L (ref 20–29)
Creatinine, Ser: 5.18 mg/dL — ABNORMAL HIGH (ref 0.76–1.27)
GFR, EST AFRICAN AMERICAN: 12 mL/min/{1.73_m2} — AB (ref >59)
GFR, EST NON AFRICAN AMERICAN: 10 mL/min/{1.73_m2} — AB (ref >59)
Glucose: 96 mg/dL (ref 65–99)
Potassium: 4.2 mmol/L (ref 3.5–5.2)
Sodium: 143 mmol/L (ref 134–144)

## 2016-11-27 LAB — PROTIME-INR
INR: 1 (ref 0.8–1.2)
PROTHROMBIN TIME: 10.8 s (ref 9.1–12.0)

## 2016-11-27 NOTE — Telephone Encounter (Signed)
Left a message for the patient to call back to inform him that his procedure on 9/19 with Dr. Fletcher Anon has been moved to 2:00. He should plan on arriving by 12.

## 2016-11-28 NOTE — Telephone Encounter (Signed)
Patient has been notified that his procedure on 9/19 with Dr. Fletcher Anon has been moved to 2:00 and that he should arrive by 12.  He verbalized his understanding.

## 2016-11-30 DIAGNOSIS — D631 Anemia in chronic kidney disease: Secondary | ICD-10-CM | POA: Diagnosis not present

## 2016-11-30 DIAGNOSIS — N2581 Secondary hyperparathyroidism of renal origin: Secondary | ICD-10-CM | POA: Diagnosis not present

## 2016-11-30 DIAGNOSIS — D509 Iron deficiency anemia, unspecified: Secondary | ICD-10-CM | POA: Diagnosis not present

## 2016-11-30 DIAGNOSIS — N186 End stage renal disease: Secondary | ICD-10-CM | POA: Diagnosis not present

## 2016-11-30 DIAGNOSIS — Z4901 Encounter for fitting and adjustment of extracorporeal dialysis catheter: Secondary | ICD-10-CM | POA: Diagnosis not present

## 2016-12-03 DIAGNOSIS — N2581 Secondary hyperparathyroidism of renal origin: Secondary | ICD-10-CM | POA: Diagnosis not present

## 2016-12-03 DIAGNOSIS — Z4901 Encounter for fitting and adjustment of extracorporeal dialysis catheter: Secondary | ICD-10-CM | POA: Diagnosis not present

## 2016-12-03 DIAGNOSIS — D631 Anemia in chronic kidney disease: Secondary | ICD-10-CM | POA: Diagnosis not present

## 2016-12-03 DIAGNOSIS — D509 Iron deficiency anemia, unspecified: Secondary | ICD-10-CM | POA: Diagnosis not present

## 2016-12-03 DIAGNOSIS — N186 End stage renal disease: Secondary | ICD-10-CM | POA: Diagnosis not present

## 2016-12-04 ENCOUNTER — Encounter (HOSPITAL_COMMUNITY): Payer: Self-pay | Admitting: Cardiology

## 2016-12-04 ENCOUNTER — Ambulatory Visit (HOSPITAL_COMMUNITY)
Admission: RE | Admit: 2016-12-04 | Discharge: 2016-12-05 | Disposition: A | Discharge status: Home / Self Care | Payer: Medicare Other | Source: Ambulatory Visit | Attending: Cardiovascular Disease | Admitting: Cardiovascular Disease

## 2016-12-04 ENCOUNTER — Encounter (HOSPITAL_COMMUNITY)
Admission: RE | Disposition: A | Discharge status: Home / Self Care | Payer: Self-pay | Source: Ambulatory Visit | Attending: Cardiovascular Disease

## 2016-12-04 DIAGNOSIS — Z86718 Personal history of other venous thrombosis and embolism: Secondary | ICD-10-CM | POA: Diagnosis not present

## 2016-12-04 DIAGNOSIS — Z8546 Personal history of malignant neoplasm of prostate: Secondary | ICD-10-CM | POA: Insufficient documentation

## 2016-12-04 DIAGNOSIS — E785 Hyperlipidemia, unspecified: Secondary | ICD-10-CM | POA: Diagnosis not present

## 2016-12-04 DIAGNOSIS — I120 Hypertensive chronic kidney disease with stage 5 chronic kidney disease or end stage renal disease: Secondary | ICD-10-CM | POA: Insufficient documentation

## 2016-12-04 DIAGNOSIS — Z833 Family history of diabetes mellitus: Secondary | ICD-10-CM | POA: Diagnosis not present

## 2016-12-04 DIAGNOSIS — K5730 Diverticulosis of large intestine without perforation or abscess without bleeding: Secondary | ICD-10-CM | POA: Diagnosis not present

## 2016-12-04 DIAGNOSIS — E7800 Pure hypercholesterolemia, unspecified: Secondary | ICD-10-CM | POA: Diagnosis not present

## 2016-12-04 DIAGNOSIS — K219 Gastro-esophageal reflux disease without esophagitis: Secondary | ICD-10-CM | POA: Diagnosis not present

## 2016-12-04 DIAGNOSIS — I10 Essential (primary) hypertension: Secondary | ICD-10-CM | POA: Insufficient documentation

## 2016-12-04 DIAGNOSIS — Z7902 Long term (current) use of antithrombotics/antiplatelets: Secondary | ICD-10-CM | POA: Insufficient documentation

## 2016-12-04 DIAGNOSIS — I739 Peripheral vascular disease, unspecified: Secondary | ICD-10-CM | POA: Diagnosis not present

## 2016-12-04 DIAGNOSIS — I25119 Atherosclerotic heart disease of native coronary artery with unspecified angina pectoris: Secondary | ICD-10-CM | POA: Insufficient documentation

## 2016-12-04 DIAGNOSIS — Z7982 Long term (current) use of aspirin: Secondary | ICD-10-CM | POA: Insufficient documentation

## 2016-12-04 DIAGNOSIS — Z992 Dependence on renal dialysis: Secondary | ICD-10-CM | POA: Insufficient documentation

## 2016-12-04 DIAGNOSIS — I200 Unstable angina: Secondary | ICD-10-CM | POA: Diagnosis present

## 2016-12-04 DIAGNOSIS — Z87891 Personal history of nicotine dependence: Secondary | ICD-10-CM | POA: Insufficient documentation

## 2016-12-04 DIAGNOSIS — E1122 Type 2 diabetes mellitus with diabetic chronic kidney disease: Secondary | ICD-10-CM | POA: Insufficient documentation

## 2016-12-04 DIAGNOSIS — Z8249 Family history of ischemic heart disease and other diseases of the circulatory system: Secondary | ICD-10-CM | POA: Insufficient documentation

## 2016-12-04 DIAGNOSIS — M109 Gout, unspecified: Secondary | ICD-10-CM | POA: Insufficient documentation

## 2016-12-04 DIAGNOSIS — I209 Angina pectoris, unspecified: Secondary | ICD-10-CM | POA: Diagnosis present

## 2016-12-04 DIAGNOSIS — Z955 Presence of coronary angioplasty implant and graft: Secondary | ICD-10-CM | POA: Diagnosis not present

## 2016-12-04 DIAGNOSIS — N186 End stage renal disease: Secondary | ICD-10-CM | POA: Insufficient documentation

## 2016-12-04 DIAGNOSIS — Z79899 Other long term (current) drug therapy: Secondary | ICD-10-CM | POA: Diagnosis not present

## 2016-12-04 DIAGNOSIS — I480 Paroxysmal atrial fibrillation: Secondary | ICD-10-CM | POA: Diagnosis not present

## 2016-12-04 DIAGNOSIS — E8889 Other specified metabolic disorders: Secondary | ICD-10-CM | POA: Insufficient documentation

## 2016-12-04 DIAGNOSIS — I252 Old myocardial infarction: Secondary | ICD-10-CM | POA: Insufficient documentation

## 2016-12-04 DIAGNOSIS — Z809 Family history of malignant neoplasm, unspecified: Secondary | ICD-10-CM | POA: Insufficient documentation

## 2016-12-04 HISTORY — DX: Personal history of other medical treatment: Z92.89

## 2016-12-04 HISTORY — PX: CORONARY ANGIOPLASTY WITH STENT PLACEMENT: SHX49

## 2016-12-04 HISTORY — DX: End stage renal disease: N18.6

## 2016-12-04 HISTORY — DX: Dependence on renal dialysis: Z99.2

## 2016-12-04 HISTORY — DX: Pure hypercholesterolemia, unspecified: E78.00

## 2016-12-04 HISTORY — PX: CORONARY STENT INTERVENTION: CATH118234

## 2016-12-04 HISTORY — DX: Cardiac murmur, unspecified: R01.1

## 2016-12-04 HISTORY — DX: Unspecified osteoarthritis, unspecified site: M19.90

## 2016-12-04 HISTORY — DX: Essential (primary) hypertension: I10

## 2016-12-04 HISTORY — DX: Old myocardial infarction: I25.2

## 2016-12-04 HISTORY — DX: Angina pectoris, unspecified: I20.9

## 2016-12-04 LAB — POCT ACTIVATED CLOTTING TIME
ACTIVATED CLOTTING TIME: 626 s
ACTIVATED CLOTTING TIME: 169 s

## 2016-12-04 SURGERY — CORONARY STENT INTERVENTION
Anesthesia: LOCAL

## 2016-12-04 MED ORDER — ENSURE ENLIVE PO LIQD: 237.0000 mL | Freq: Two times a day (BID) | ORAL | Status: DC

## 2016-12-04 MED ORDER — CLOPIDOGREL BISULFATE 300 MG PO TABS
ORAL_TABLET | ORAL | Status: DC | PRN
  Administered 2016-12-04: 300 mg via ORAL

## 2016-12-04 MED ORDER — BIVALIRUDIN BOLUS VIA INFUSION - CUPID
INTRAVENOUS | Status: DC | PRN
  Administered 2016-12-04: 64.65 mg via INTRAVENOUS

## 2016-12-04 MED ORDER — FENTANYL CITRATE (PF) 100 MCG/2ML IJ SOLN
INTRAMUSCULAR | Status: DC | PRN
  Administered 2016-12-04 (×2): 25 ug via INTRAVENOUS

## 2016-12-04 MED ORDER — SODIUM CHLORIDE 0.9% FLUSH: 3.0000 mL | INTRAVENOUS | Status: DC | PRN

## 2016-12-04 MED ORDER — B COMPLEX PO TABS: 1.0000 | ORAL_TABLET | Freq: Every day | ORAL | Status: DC

## 2016-12-04 MED ORDER — HEART ATTACK BOUNCING BOOK
Freq: Once | Status: AC
  Administered 2016-12-04: 22:00:00
  Filled 2016-12-04: qty 1

## 2016-12-04 MED ORDER — ASPIRIN 81 MG PO CHEW: 81.0000 mg | CHEWABLE_TABLET | ORAL | Status: DC

## 2016-12-04 MED ORDER — SODIUM CHLORIDE 0.9 % IV SOLN: 250.0000 mL | INTRAVENOUS | Status: DC | PRN

## 2016-12-04 MED ORDER — ALLOPURINOL 100 MG PO TABS
100.0000 mg | ORAL_TABLET | Freq: Every day | ORAL | Status: DC
  Administered 2016-12-05: 100 mg via ORAL
  Filled 2016-12-04: qty 1

## 2016-12-04 MED ORDER — ONDANSETRON HCL 4 MG/2ML IJ SOLN: 4.0000 mg | Freq: Four times a day (QID) | INTRAMUSCULAR | Status: DC | PRN

## 2016-12-04 MED ORDER — PANTOPRAZOLE SODIUM 40 MG PO TBEC: 40.0000 mg | DELAYED_RELEASE_TABLET | Freq: Every day | ORAL | Status: DC

## 2016-12-04 MED ORDER — FAMOTIDINE 20 MG PO TABS
20.0000 mg | ORAL_TABLET | Freq: Every day | ORAL | Status: DC
  Administered 2016-12-04 – 2016-12-05 (×2): 20 mg via ORAL
  Filled 2016-12-04 (×2): qty 1

## 2016-12-04 MED ORDER — MIDAZOLAM HCL 2 MG/2ML IJ SOLN
INTRAMUSCULAR | Status: DC | PRN
  Administered 2016-12-04: 1 mg via INTRAVENOUS

## 2016-12-04 MED ORDER — SODIUM CHLORIDE 0.9 % IV SOLN
INTRAVENOUS | Status: DC | PRN
  Administered 2016-12-04: 0.25 mg/kg/h via INTRAVENOUS

## 2016-12-04 MED ORDER — CARVEDILOL 3.125 MG PO TABS
3.1250 mg | ORAL_TABLET | Freq: Two times a day (BID) | ORAL | Status: DC
  Administered 2016-12-04 – 2016-12-05 (×2): 3.125 mg via ORAL
  Filled 2016-12-04 (×2): qty 1

## 2016-12-04 MED ORDER — HEPARIN (PORCINE) IN NACL 2-0.9 UNIT/ML-% IJ SOLN
INTRAMUSCULAR | Status: AC
  Filled 2016-12-04: qty 1000

## 2016-12-04 MED ORDER — B COMPLEX-C PO TABS
1.0000 | ORAL_TABLET | Freq: Every day | ORAL | Status: DC
  Administered 2016-12-05: 1 via ORAL
  Filled 2016-12-04: qty 1

## 2016-12-04 MED ORDER — PANTOPRAZOLE SODIUM 40 MG PO TBEC
40.0000 mg | DELAYED_RELEASE_TABLET | Freq: Every day | ORAL | Status: DC
  Administered 2016-12-04 – 2016-12-05 (×2): 40 mg via ORAL
  Filled 2016-12-04 (×2): qty 1

## 2016-12-04 MED ORDER — IOPAMIDOL (ISOVUE-370) INJECTION 76%
INTRAVENOUS | Status: AC
  Filled 2016-12-04: qty 100

## 2016-12-04 MED ORDER — FENTANYL CITRATE (PF) 100 MCG/2ML IJ SOLN: 25.0000 ug | INTRAMUSCULAR | Status: DC | PRN

## 2016-12-04 MED ORDER — SODIUM CHLORIDE 0.9 % IV SOLN: INTRAVENOUS | Status: DC

## 2016-12-04 MED ORDER — AMLODIPINE BESYLATE 10 MG PO TABS
10.0000 mg | ORAL_TABLET | Freq: Every day | ORAL | Status: DC
  Administered 2016-12-05: 10 mg via ORAL
  Filled 2016-12-04: qty 1

## 2016-12-04 MED ORDER — LABETALOL HCL 5 MG/ML IV SOLN: 10.0000 mg | INTRAVENOUS | Status: AC | PRN

## 2016-12-04 MED ORDER — NITROGLYCERIN 1 MG/10 ML FOR IR/CATH LAB
INTRA_ARTERIAL | Status: AC
  Filled 2016-12-04: qty 10

## 2016-12-04 MED ORDER — ACETAMINOPHEN 325 MG PO TABS: 650.0000 mg | ORAL_TABLET | ORAL | Status: DC | PRN

## 2016-12-04 MED ORDER — LIDOCAINE HCL 2 % IJ SOLN
INTRAMUSCULAR | Status: AC
  Filled 2016-12-04: qty 10

## 2016-12-04 MED ORDER — IOPAMIDOL (ISOVUE-370) INJECTION 76%
INTRAVENOUS | Status: AC
  Filled 2016-12-04: qty 50

## 2016-12-04 MED ORDER — HYDRALAZINE HCL 20 MG/ML IJ SOLN: 5.0000 mg | INTRAMUSCULAR | Status: AC | PRN

## 2016-12-04 MED ORDER — ASPIRIN EC 81 MG PO TBEC
81.0000 mg | DELAYED_RELEASE_TABLET | Freq: Every day | ORAL | Status: DC
  Administered 2016-12-05: 81 mg via ORAL
  Filled 2016-12-04: qty 1

## 2016-12-04 MED ORDER — SODIUM CHLORIDE 0.9% FLUSH: 3.0000 mL | Freq: Two times a day (BID) | INTRAVENOUS | Status: DC

## 2016-12-04 MED ORDER — IOHEXOL 350 MG/ML SOLN
INTRAVENOUS | Status: DC | PRN
Start: 2016-12-04 — End: 2016-12-04
  Administered 2016-12-04: 130 mL via INTRACARDIAC

## 2016-12-04 MED ORDER — CLOPIDOGREL BISULFATE 75 MG PO TABS
75.0000 mg | ORAL_TABLET | Freq: Every day | ORAL | Status: DC
  Administered 2016-12-05: 75 mg via ORAL
  Filled 2016-12-04: qty 1

## 2016-12-04 MED ORDER — ATORVASTATIN CALCIUM 80 MG PO TABS
80.0000 mg | ORAL_TABLET | Freq: Every day | ORAL | Status: DC
  Administered 2016-12-04 – 2016-12-05 (×2): 80 mg via ORAL
  Filled 2016-12-04 (×2): qty 1

## 2016-12-04 MED ORDER — ATROPINE SULFATE 1 MG/10ML IJ SOSY
PREFILLED_SYRINGE | INTRAMUSCULAR | Status: AC
  Filled 2016-12-04: qty 10

## 2016-12-04 MED ORDER — CLOPIDOGREL BISULFATE 300 MG PO TABS
ORAL_TABLET | ORAL | Status: AC
  Filled 2016-12-04: qty 1

## 2016-12-04 MED ORDER — ANGIOPLASTY BOOK
Freq: Once | Status: AC
  Administered 2016-12-04: 22:00:00
  Filled 2016-12-04: qty 1

## 2016-12-04 MED ORDER — FENTANYL CITRATE (PF) 100 MCG/2ML IJ SOLN
INTRAMUSCULAR | Status: AC
  Filled 2016-12-04: qty 2

## 2016-12-04 MED ORDER — HYDRALAZINE HCL 50 MG PO TABS
50.0000 mg | ORAL_TABLET | ORAL | Status: DC
  Administered 2016-12-04 – 2016-12-05 (×2): 50 mg via ORAL
  Filled 2016-12-04 (×2): qty 1

## 2016-12-04 MED ORDER — MIDAZOLAM HCL 2 MG/2ML IJ SOLN
INTRAMUSCULAR | Status: AC
  Filled 2016-12-04: qty 2

## 2016-12-04 MED ORDER — SODIUM CHLORIDE 0.9% FLUSH
3.0000 mL | Freq: Two times a day (BID) | INTRAVENOUS | Status: DC
  Administered 2016-12-04: 3 mL via INTRAVENOUS

## 2016-12-04 MED ORDER — LIDOCAINE HCL (PF) 1 % IJ SOLN
INTRAMUSCULAR | Status: DC | PRN
Start: 2016-12-04 — End: 2016-12-04
  Administered 2016-12-04: 10 mL

## 2016-12-04 MED ORDER — BIVALIRUDIN TRIFLUOROACETATE 250 MG IV SOLR
INTRAVENOUS | Status: AC
  Filled 2016-12-04: qty 250

## 2016-12-04 MED ORDER — HEPARIN (PORCINE) IN NACL 2-0.9 UNIT/ML-% IJ SOLN
INTRAMUSCULAR | Status: AC | PRN
  Administered 2016-12-04: 1000 mL

## 2016-12-04 SURGICAL SUPPLY — 16 items
BALLN EMERGE MR 2.5X8 (BALLOONS) ×2
BALLN SAPPHIRE ~~LOC~~ 3.25X8 (BALLOONS) ×1 IMPLANT
BALLOON EMERGE MR 2.5X8 (BALLOONS) IMPLANT
BAND VASC HEMOSTAT XLNG 29CM (HEMOSTASIS) ×1 IMPLANT
CATH VISTA GUIDE 6FR XB3.5 (CATHETERS) ×1 IMPLANT
KIT ENCORE 26 ADVANTAGE (KITS) ×3 IMPLANT
KIT HEART LEFT (KITS) ×2 IMPLANT
KIT MICROINTRODUCER STIFF 5F (SHEATH) ×1 IMPLANT
PACK CARDIAC CATHETERIZATION (CUSTOM PROCEDURE TRAY) ×2 IMPLANT
SHEATH PINNACLE 6F 10CM (SHEATH) ×1 IMPLANT
STENT RESOLUTE ONYX 3.0X12 (Permanent Stent) ×1 IMPLANT
TRANSDUCER W/STOPCOCK (MISCELLANEOUS) ×2 IMPLANT
TUBING CIL FLEX 10 FLL-RA (TUBING) ×2 IMPLANT
WIRE EMERALD 3MM-J .035X150CM (WIRE) ×1 IMPLANT
WIRE HI TORQ VERSACORE-J 145CM (WIRE) ×1 IMPLANT
WIRE RUNTHROUGH .014X180CM (WIRE) ×1 IMPLANT

## 2016-12-04 NOTE — Interval H&P Note (Signed)
History and Physical Interval Note:  12/04/2016 3:45 PM  Roger [name]  has presented today for surgery, with the diagnosis of cad  The various methods of treatment have been discussed with the patient and family. After consideration of risks, benefits and other options for treatment, the patient has consented to  Procedure(s): CORONARY STENT INTERVENTION (N/A) as a surgical intervention .  The patient's history has been reviewed, patient examined, no change in status, stable for surgery.  I have reviewed the patient's chart and labs.  Questions were answered to the patient's satisfaction.     [name]

## 2016-12-04 NOTE — H&P (View-Only) (Signed)
Office Visit    Patient Name: Roger [name] Date of Encounter: 11/26/2016  Primary Care Provider:  [name], MD Primary Cardiologist:  Jerilynn Mages. Fletcher Anon, MD   Chief Complaint    76 year old male with a history of peripheral arterial disease, tobacco abuse, end-stage renal disease, anemia, paroxysmal atrial fibrillation, and recent GI bleed as well as non-STEMI with finding of multivessel coronary artery disease, who presents for follow-up.  Past Medical History    Past Medical History:  Diagnosis Date  . Anemia   . CAD (coronary artery disease)    a. NSTEMI in setting of anemia; b. 10/2016 MV: inflat ST dep with large, reversible ant, apical, inflat defect; c. 10/2016 Cath: LM nl, LAD 40p, 4m, D1 70, D2 80, RI min irregs, LCX 100ost, RCA 100p, 1102m/d, RPDA fills via collats from LAD, RPAV small-->Initial conservative Rx in setting of GIB w/ plan for PCI LAD if H/H stable on ASA/Plavix.  . Diastolic dysfunction    a. 10/2016 Echo: EF 55-60%, no rwma, Gr2 DD, triv MR, mildly dil LA.  Marland Kitchen ESRD (end stage renal disease) ([place])    a. 10/2016 HD catheter insterted and HD initiated; b. Pending permanent HD access folllowing Cardiac Revascularization.  Marland Kitchen GERD (gastroesophageal reflux disease)   . GIB (gastrointestinal bleeding)    a. 10/2016 3u PRBCs - EGD/Colonoscopy: angiodysplasia w/ diverticulosis. No apparent bleeding. Polypecotmy->tubular adenomas.  . Gout   . History of DVT (deep vein thrombosis)   . Iron deficiency anemia   . PAF (paroxysmal atrial fibrillation) ([place])   . Peripheral vascular disease ([place])   . Prostate cancer ([place])    a. s/p seed implantation.  . Tobacco abuse    Past Surgical History:  Procedure Laterality Date  . COLONOSCOPY WITH PROPOFOL N/A 10/21/2016   Procedure: COLONOSCOPY WITH PROPOFOL;  Surgeon: Gatha Mayer, MD;  Location: [facility] ENDOSCOPY;  Service: Endoscopy;  Laterality: N/A;  . ESOPHAGOGASTRODUODENOSCOPY N/A 10/21/2016   Procedure: ESOPHAGOGASTRODUODENOSCOPY  (EGD);  Surgeon: Gatha Mayer, MD;  Location: [facility] ENDOSCOPY;  Service: Endoscopy;  Laterality: N/A;  . INSERTION OF DIALYSIS CATHETER Right 10/22/2016   Procedure: INSERTION OF TUNNELED DIALYSIS CATHETER;  Surgeon: Elam Dutch, MD;  Location: [place];  Service: Vascular;  Laterality: Right;  . LEFT HEART CATH AND CORONARY ANGIOGRAPHY N/A 10/23/2016   Procedure: LEFT HEART CATH AND CORONARY ANGIOGRAPHY;  Surgeon: Wellington Hampshire, MD;  Location: [place] CV LAB;  Service: Cardiovascular;  Laterality: N/A;    Allergies  No Known Allergies  History of Present Illness    76 year old male with the above complex past medical history including peripheral arterial disease, tobacco abuse, chronic kidney disease, anemia, gout, GERD, paroxysmal atrial fibrillation, history of DVT, and prostate cancer. Patient was recently admitted to [name] on August 4 with epigastric pain and diaphoresis. He also been having melena for approximately 2-1/2 months. He was found to be anemic with a hemoglobin of 6.2. His creatinine was elevated at 5.7. Troponin was elevated at 3.4. ECG showed inferolateral ST segment depressions. He required 3 units of packed red blood cells. He was seen by cardiology and stress testing was abnormal and suggestive of anterior, apical, and inferolateral ischemia. Echo showed normal LV function. Catheterization showed total occlusions of the left circumflex and right coronary arteries. There were left to right collaterals. The LAD had a 70% stenosis in the midsection of the vessel. In the setting of GI bleed, it was felt that an initial attempt at medical  therapy would be appropriate and that if hemoglobin and hematocrit remained stable while on aspirin and Plavix, PCI of the LAD could be scheduled at a later date. EGD and colonoscopy were performed showing angiodysplasia or diverticulosis of the colon. No bleeding was found. Polypectomy was performed and pathology was consistent with  tubular adenoma. Patient's hemoglobin and hematocrit were stable on aspirin and Plavix. In the setting of acute on chronic renal failure, a dialysis catheter was placed. Hemodialysis was initiated. He was seen by vascular surgery with recommendation for eventual permanent hemodialysis access however, from a cardiovascular standpoint, he was felt to be at least at moderate risk and therefore permanent dialysis access has been deferred until his LAD lesion is addressed.  Since discharge he has been living with his daughter in [place], [place].  Dialysis sessions have been Tuesday, Thursday, and Saturdays in [place] these were recently switched to a center in [place], at least until he is living independently again in [place]. Patient initially says that he has been doing well since discharge and denies chest pain, dyspnea, PND, orthopnea, dizziness, syncope, edema, or early satiety. When asked who is tolerating dialysis however, he notes that he gets "indigestion" approximately 2 hours and every single session, lasting 2 hours, and resolving with completion of dialysis. He has had similar symptoms in the past despite taking PPI and H2 blocker therapy. He has never considered that this might potentially be angina. He has not had any exertional angina. Activity has been fairly limited though he is working with physical therapy. He does have some dyspnea on exertion when participating in physical therapy. He and his daughter are interested in getting him scheduled for LAD percutaneous intervention.  Home Medications    Prior to Admission medications   Medication Sig Start Date End Date Taking? Authorizing Provider  allopurinol (ZYLOPRIM) 100 MG tablet Take 1 tablet (100 mg total) by mouth daily. 10/26/16  Yes Florencia Reasons, MD  amLODipine (NORVASC) 10 MG tablet Take 1 tablet (10 mg total) by mouth daily. 10/26/16  Yes Florencia Reasons, MD  aspirin EC 81 MG tablet Take 1 tablet (81 mg total) by mouth daily. 10/26/16 10/26/17  Yes Florencia Reasons, MD  atorvastatin (LIPITOR) 80 MG tablet Take 1 tablet (80 mg total) by mouth daily at 6 PM. 10/26/16  Yes Florencia Reasons, MD  carvedilol (COREG) 3.125 MG tablet Take 1 tablet (3.125 mg total) by mouth 2 (two) times daily with a meal. 10/26/16  Yes Florencia Reasons, MD  clopidogrel (PLAVIX) 75 MG tablet Take 1 tablet (75 mg total) by mouth daily. 10/26/16  Yes Florencia Reasons, MD  esomeprazole (NEXIUM) 40 MG capsule Take 1 capsule (40 mg total) by mouth daily. 10/26/16 10/26/17 Yes Florencia Reasons, MD  hydrALAZINE (APRESOLINE) 50 MG tablet Take 1 tablet (50 mg total) by mouth every 8 (eight) hours. 10/26/16  Yes Florencia Reasons, MD  multivitamin (RENA-VIT) TABS tablet Take 1 tablet by mouth at bedtime. 10/26/16  Yes Florencia Reasons, MD  ranitidine (ZANTAC) 150 MG tablet Take 150 mg by mouth daily as needed for heartburn.   Yes [provider]   Family History  Problem Relation Age of Onset  . Cancer Mother   . Cancer Father   . Heart attack Father   . Diabetes Father   . Hypertension Father   . Cancer Sister   . Cancer Brother   . Diabetes Brother   . Hypertension Brother   . Hypertension Daughter    Social History   Social  History  . Marital status: Widowed    Spouse name: N/A  . Number of children: N/A  . Years of education: N/A   Occupational History  . Not on file.   Social History Main Topics  . Smoking status: Former Smoker    Packs/day: 0.50    Years: 50.00    Types: Cigarettes    Quit date: 10/18/2016  . Smokeless tobacco: Never Used  . Alcohol use No  . Drug use: No  . Sexual activity: Not on file   Other Topics Concern  . Not on file   Social History Narrative  . No narrative on file    Review of Systems    He has had some substernal chest discomfort associated with dialysis sessions. He also has some dyspnea on exertion when working with physical therapy. He denies PND, orthopnea, dizziness, syncope, edema, or early satiety  All other systems reviewed and are otherwise negative  except as noted above.  Physical Exam    VS:  BP 128/60 (BP Location: Left Arm, Patient Position: Sitting, Cuff Size: Normal)   Pulse 80   Ht 5\' 6"  (1.676 m)   Wt 190 lb 4 oz (86.3 kg)   BMI 30.71 kg/m  , BMI Body mass index is 30.71 kg/m. GEN: Well nourished, well developed, in no acute distress.  HEENT: normal.  Neck: Supple, no JVD, carotid bruits, or masses. Cardiac: RRR, 2/6 systolic ejection murmur at the right upper sternal border, rubs, or gallops. No clubbing, cyanosis, edema.  Radials/DP/PT 2+ and equal bilaterally.  Respiratory:  Respirations regular and unlabored, clear to auscultation bilaterally. GI: Soft, nontender, nondistended, BS + x 4. MS: no deformity or atrophy. Skin: warm and dry, no rash. Neuro:  Strength and sensation are intact. Psych: Normal affect.  Accessory Clinical Findings    ECG - Regular sinus rhythm, 80, first-degree AV block, nonspecific ST and T changes, mildly prolonged QT. No acute changes.  Assessment & Plan    1.  Non-STEMI, subsequent episode of care/coronary artery disease: Patient was recently admitted in the setting of nausea, abdominal pain, and severe anemia with GI bleeding. In that setting, he also had chest pain and troponin elevation. Catheterization showed total occlusions of the left circumflex and right coronary artery with significant disease in the LAD. Stress testing prior to catheterization did show ischemia in the LAD territory. He has been on aspirin and Plavix for the past month and has had some substernal chest discomfort with dialysis sessions which he attributes to GERD despite being on PPI and H2 blocker therapy. He has not had any exertional chest pain but has had dyspnea on exertion. I will follow-up labs today including a CBC, basic metabolic panel, and coagulation studies. Provided that hemoglobin and hematocrit are stable, I will arrange for percutaneous intervention of the LAD with Dr. Fletcher Anon next Wednesday at [facility], as was previously planned. Given prior history of GI bleeding and need for permanent vascular access surgery, it was previously felt that bare-metal stenting would be most appropriate therefore limiting exposure to dual antiplatelet therapy to just another 30 days post procedure.  If H&H returned showing significant anemia, he will likely require holding of Plavix, repeat transfusion, and additional workup prior to percutaneous intervention. Continue statin and beta blocker therapy.  2. End-stage renal disease: Pending AV fistula once cardiac issues have been dealt with and he is off of Plavix. See #1. He is on Tuesday, Thursday, Saturday dialysis and does have some chest  pain with sessions. He will likely need inpatient HD following his percutaneous intervention.  3. Essential hypertension: Blood pressure is stable today on beta blocker, hydralazine, and calcium channel blocker therapy.  4. Hyperlipidemia: Continue high potency statin therapy. LDL was 140 on August 5. He will need follow-up fasting labs and perhaps we can obtain these when he is in the hospital next week.  5. Recent GI bleed: No recent melena. Follow-up CBC today. Next  6. Peripheral arterial disease: His chronic stable bilateral calf claudication. Activity is limited. Previously followed by Dr. Scot Dock. Continue aspirin, Plavix, statin therapy.  7. Tobacco abuse: He has quit.  8. Disposition: Follow-up labs today. Plan on percutaneous intervention of the LAD with probably a bare-metal stent next Wednesday. Follow-up in clinic in 3 weeks.  [name], NP 11/26/2016, 1:13 PM

## 2016-12-05 ENCOUNTER — Encounter (HOSPITAL_COMMUNITY): Payer: Self-pay | Admitting: Cardiovascular Disease

## 2016-12-05 DIAGNOSIS — E8889 Other specified metabolic disorders: Secondary | ICD-10-CM | POA: Diagnosis not present

## 2016-12-05 DIAGNOSIS — Z7902 Long term (current) use of antithrombotics/antiplatelets: Secondary | ICD-10-CM | POA: Diagnosis not present

## 2016-12-05 DIAGNOSIS — Z809 Family history of malignant neoplasm, unspecified: Secondary | ICD-10-CM | POA: Diagnosis not present

## 2016-12-05 DIAGNOSIS — I120 Hypertensive chronic kidney disease with stage 5 chronic kidney disease or end stage renal disease: Secondary | ICD-10-CM | POA: Diagnosis not present

## 2016-12-05 DIAGNOSIS — I2510 Atherosclerotic heart disease of native coronary artery without angina pectoris: Secondary | ICD-10-CM

## 2016-12-05 DIAGNOSIS — Z87891 Personal history of nicotine dependence: Secondary | ICD-10-CM | POA: Diagnosis not present

## 2016-12-05 DIAGNOSIS — I480 Paroxysmal atrial fibrillation: Secondary | ICD-10-CM | POA: Diagnosis not present

## 2016-12-05 DIAGNOSIS — E1122 Type 2 diabetes mellitus with diabetic chronic kidney disease: Secondary | ICD-10-CM | POA: Diagnosis not present

## 2016-12-05 DIAGNOSIS — Z955 Presence of coronary angioplasty implant and graft: Secondary | ICD-10-CM | POA: Diagnosis not present

## 2016-12-05 DIAGNOSIS — I739 Peripheral vascular disease, unspecified: Secondary | ICD-10-CM | POA: Diagnosis not present

## 2016-12-05 DIAGNOSIS — E7800 Pure hypercholesterolemia, unspecified: Secondary | ICD-10-CM | POA: Diagnosis not present

## 2016-12-05 DIAGNOSIS — N186 End stage renal disease: Secondary | ICD-10-CM | POA: Diagnosis not present

## 2016-12-05 DIAGNOSIS — E785 Hyperlipidemia, unspecified: Secondary | ICD-10-CM | POA: Diagnosis not present

## 2016-12-05 DIAGNOSIS — Z86718 Personal history of other venous thrombosis and embolism: Secondary | ICD-10-CM | POA: Diagnosis not present

## 2016-12-05 DIAGNOSIS — I209 Angina pectoris, unspecified: Secondary | ICD-10-CM | POA: Diagnosis not present

## 2016-12-05 DIAGNOSIS — I25119 Atherosclerotic heart disease of native coronary artery with unspecified angina pectoris: Secondary | ICD-10-CM | POA: Diagnosis not present

## 2016-12-05 DIAGNOSIS — K5730 Diverticulosis of large intestine without perforation or abscess without bleeding: Secondary | ICD-10-CM | POA: Diagnosis not present

## 2016-12-05 DIAGNOSIS — Z833 Family history of diabetes mellitus: Secondary | ICD-10-CM | POA: Diagnosis not present

## 2016-12-05 DIAGNOSIS — I252 Old myocardial infarction: Secondary | ICD-10-CM | POA: Diagnosis not present

## 2016-12-05 DIAGNOSIS — Z79899 Other long term (current) drug therapy: Secondary | ICD-10-CM | POA: Diagnosis not present

## 2016-12-05 DIAGNOSIS — Z992 Dependence on renal dialysis: Secondary | ICD-10-CM | POA: Diagnosis not present

## 2016-12-05 DIAGNOSIS — K219 Gastro-esophageal reflux disease without esophagitis: Secondary | ICD-10-CM | POA: Diagnosis not present

## 2016-12-05 DIAGNOSIS — Z7982 Long term (current) use of aspirin: Secondary | ICD-10-CM | POA: Diagnosis not present

## 2016-12-05 DIAGNOSIS — I10 Essential (primary) hypertension: Secondary | ICD-10-CM | POA: Diagnosis not present

## 2016-12-05 DIAGNOSIS — M109 Gout, unspecified: Secondary | ICD-10-CM | POA: Diagnosis not present

## 2016-12-05 LAB — CBC
HCT: 31.1 % — ABNORMAL LOW (ref 39.0–52.0)
HEMOGLOBIN: 9.5 g/dL — AB (ref 13.0–17.0)
MCH: 27.8 pg (ref 26.0–34.0)
MCHC: 30.5 g/dL (ref 30.0–36.0)
MCV: 90.9 fL (ref 78.0–100.0)
Platelets: 201 10*3/uL (ref 150–400)
RBC: 3.42 MIL/uL — AB (ref 4.22–5.81)
RDW: 19.9 % — ABNORMAL HIGH (ref 11.5–15.5)
WBC: 6.4 10*3/uL (ref 4.0–10.5)

## 2016-12-05 LAB — BASIC METABOLIC PANEL
ANION GAP: 11 (ref 5–15)
BUN: 22 mg/dL — ABNORMAL HIGH (ref 6–20)
CALCIUM: 8.2 mg/dL — AB (ref 8.9–10.3)
CHLORIDE: 97 mmol/L — AB (ref 101–111)
CO2: 26 mmol/L (ref 22–32)
Creatinine, Ser: 5.97 mg/dL — ABNORMAL HIGH (ref 0.61–1.24)
GFR calc non Af Amer: 8 mL/min — ABNORMAL LOW (ref >60)
GFR, EST AFRICAN AMERICAN: 10 mL/min — AB (ref >60)
Glucose, Bld: 94 mg/dL (ref 65–99)
POTASSIUM: 3.4 mmol/L — AB (ref 3.5–5.1)
Sodium: 134 mmol/L — ABNORMAL LOW (ref 135–145)

## 2016-12-05 LAB — MRSA PCR SCREENING: MRSA BY PCR: NEGATIVE

## 2016-12-05 LAB — HEPATITIS B SURFACE ANTIGEN: Hepatitis B Surface Ag: NEGATIVE

## 2016-12-05 LAB — PHOSPHORUS: Phosphorus: 5 mg/dL — ABNORMAL HIGH (ref 2.5–4.6)

## 2016-12-05 MED ORDER — CALCITRIOL 0.25 MCG PO CAPS
0.2500 ug | ORAL_CAPSULE | ORAL | Status: DC
  Administered 2016-12-05: 0.25 ug via ORAL
  Filled 2016-12-05: qty 1

## 2016-12-05 MED ORDER — PANTOPRAZOLE SODIUM 40 MG PO TBEC
40.0000 mg | DELAYED_RELEASE_TABLET | Freq: Every day | ORAL | 6 refills | Status: DC
Start: 2016-12-05 — End: 2016-12-20

## 2016-12-05 MED ORDER — CALCITRIOL 0.25 MCG PO CAPS: 0.2500 ug | ORAL_CAPSULE | ORAL | 0 refills | Status: DC

## 2016-12-05 NOTE — Procedures (Signed)
   I was present at this dialysis session, have reviewed the session itself and made  appropriate changes Kelly Splinter MD [facility] pager [phone]   12/05/2016, 10:38 AM

## 2016-12-05 NOTE — Progress Notes (Signed)
CARDIAC REHAB PHASE I   Pt awaiting transport to dialysis, eating breakfast, will hold ambulation at this time to allow pt to eat, encouraged pt to ambulate with staff after dialysis before discharge. Completed PCI/stent education with pt and daughter at bedside. Reviewed risk factors, MI/PCI books, anti-platelet therapy, stent card, activity restrictions, ntg, exercise, and phase 2 cardiac rehab. Pt follows renal diet per dialysis. Pt and daughter verbalized understanding, receptive to education. Pt agrees to phase 2 cardiac rehab referral, will send to [facility] per pt request. Pt sitting on edge of bed, call bell within reach.   Leon, RN, BSN 12/05/2016 9:00 AM

## 2016-12-05 NOTE — Consult Note (Signed)
[facility] Renal Consultation Note    Indication for Consultation:  Management of ESRD/hemodialysis; anemia, hypertension/volume and secondary hyperparathyroidism Referring physician: [name], MD  HPI: Roger [name] is a 76 y.o. male with ESRD on TTS HD who started HD during August 2018 admission.  Pt had a NSTEMI at that time as well as GIB.  Patient was discharged with stable hgb on asa and plavix. He has been having HD in [place] at [place] and now [place] (with his daughter) since 9/6 but returned here for cardiac procedure.  His plan is to return back to the [place] dialysis unit after d/c today. His last outpatient treatment was 9/18. He ran his full time with a net UF of only 0.2.  Post HD wt was at EDW of 85.  Post BP 140/82 sitting and 171/75 standing P 81.   He feels good this am.  No CP or SOB. Good urine output, appetite great. Eating breakfast at present.  Hx of prior heartburn which patient states, Dr. Fletcher Anon thinks was CP.  He has had prior symptoms during HD and is anxious to see if symptoms reoccur on HD today  Past Medical History:  Diagnosis Date  . Anemia   . Anginal pain ([place])   . Arthritis    "all over; bad in the legs" (12/04/2016)  . CAD (coronary artery disease)    a. NSTEMI in setting of anemia; b. 10/2016 MV: inflat ST dep with large, reversible ant, apical, inflat defect; c. 10/2016 Cath: LM nl, LAD 40p, 17m, D1 70, D2 80, RI min irregs, LCX 100ost, RCA 100p, 142m/d, RPDA fills via collats from LAD, RPAV small-->Initial conservative Rx in setting of GIB w/ plan for PCI LAD if H/H stable on ASA/Plavix. d cath 12/04/16 s/p PTCA and DES to mLAD  . Diastolic dysfunction    a. 10/2016 Echo: EF 55-60%, no rwma, Gr2 DD, triv MR, mildly dil LA.  Marland Kitchen ESRD (end stage renal disease) ([place])    a. 10/2016 HD catheter insterted and HD initiated; b. Pending permanent HD access folllowing Cardiac Revascularization.  . ESRD on dialysis [facility])    "[place]; [address]; TTS  usually; going to [facility] in [place], [place] right now while staying w/daughter" (12/04/2016)  . GERD (gastroesophageal reflux disease)   . GIB (gastrointestinal bleeding) 10/2011   a. 10/2016 3u PRBCs - EGD/Colonoscopy: angiodysplasia w/ diverticulosis. No apparent bleeding. Polypecotmy->tubular adenomas.  . Gout   . Heart murmur   . High cholesterol   . History of blood transfusion 10/18/2016   "related to anemia" (12/04/2016)  . Hypertension   . Iron deficiency anemia   . Myocardial infarction ([place]) 10/18/2016  . Old MI (myocardial infarction)    "found evidence of this on 10/18/2016"  . PAF (paroxysmal atrial fibrillation) ([place])   . Peripheral vascular disease ([place])    BLE  . Prostate cancer ([place])    a. s/p seed implantation.  . Tobacco abuse    Past Surgical History:  Procedure Laterality Date  . CARDIAC CATHETERIZATION  10/2016  . COLONOSCOPY WITH PROPOFOL N/A 10/21/2016   Procedure: COLONOSCOPY WITH PROPOFOL;  Surgeon: Gatha Mayer, MD;  Location: [facility] ENDOSCOPY;  Service: Endoscopy;  Laterality: N/A;  . CORONARY ANGIOPLASTY WITH STENT PLACEMENT  12/04/2016   "1 stent"  . CORONARY STENT INTERVENTION N/A 12/04/2016   Procedure: CORONARY STENT INTERVENTION;  Surgeon: Wellington Hampshire, MD;  Location: [place] CV LAB;  Service: Cardiovascular;  Laterality: N/A;  . ESOPHAGOGASTRODUODENOSCOPY N/A  10/21/2016   Procedure: ESOPHAGOGASTRODUODENOSCOPY (EGD);  Surgeon: Gatha Mayer, MD;  Location[facility] ENDOSCOPY;  Service: Endoscopy;  Laterality: N/A;  . INSERTION OF DIALYSIS CATHETER Right 10/22/2016   Procedure: INSERTION OF TUNNELED DIALYSIS CATHETER;  Surgeon: Elam Dutch, MD;  Location: [facility];  Service: Vascular;  Laterality: Right;  . LEFT HEART CATH AND CORONARY ANGIOGRAPHY N/A 10/23/2016   Procedure: LEFT HEART CATH AND CORONARY ANGIOGRAPHY;  Surgeon: Wellington Hampshire, MD;  Location: [place] CV LAB;  Service: Cardiovascular;  Laterality: N/A;  . TONSILLECTOMY     Family History   Problem Relation Age of Onset  . Cancer Mother   . Cancer Father   . Heart attack Father   . Diabetes Father   . Hypertension Father   . Cancer Sister   . Cancer Brother   . Diabetes Brother   . Hypertension Brother   . Hypertension Daughter    Social History:  reports that he quit smoking about 6 weeks ago. His smoking use included Cigarettes. He has a 25.00 pack-year smoking history. He has never used smokeless tobacco. He reports that he does not drink alcohol or use drugs. No Known Allergies Prior to Admission medications   Medication Sig Start Date End Date Taking? Authorizing Provider  allopurinol (ZYLOPRIM) 100 MG tablet Take 1 tablet (100 mg total) by mouth daily. 10/26/16  Yes Florencia Reasons, MD  amLODipine (NORVASC) 10 MG tablet Take 1 tablet (10 mg total) by mouth daily. 11/26/16  Yes Rogelia Mire, NP  aspirin EC 81 MG tablet Take 1 tablet (81 mg total) by mouth daily. 10/26/16 10/26/17 Yes Florencia Reasons, MD  atorvastatin (LIPITOR) 80 MG tablet Take 1 tablet (80 mg total) by mouth daily at 6 PM. 11/26/16  Yes Rogelia Mire, NP  b complex vitamins tablet Take 1 tablet by mouth at bedtime. (2300)   Yes [provider]  carvedilol (COREG) 3.125 MG tablet Take 1 tablet (3.125 mg total) by mouth 2 (two) times daily with a meal. 11/26/16  Yes Rogelia Mire, NP  clopidogrel (PLAVIX) 75 MG tablet Take 1 tablet (75 mg total) by mouth daily. 11/26/16  Yes Rogelia Mire, NP  esomeprazole (NEXIUM) 40 MG capsule Take 1 capsule (40 mg total) by mouth daily. Patient taking differently: Take 40 mg by mouth every evening.  10/26/16 10/26/17 Yes Florencia Reasons, MD  hydrALAZINE (APRESOLINE) 50 MG tablet Take 1 tablet (50 mg total) by mouth every 8 (eight) hours. Patient taking differently: Take 50 mg by mouth every 8 (eight) hours. (0800, 1700, 2300) 11/26/16  Yes Rogelia Mire, NP  ranitidine (ZANTAC) 150 MG tablet Take 150 mg by mouth daily before breakfast.    Yes [provider]  multivitamin (RENA-VIT) TABS tablet Take 1 tablet by mouth at bedtime. Patient not taking: Reported on 11/29/2016 10/26/16   Florencia Reasons, MD   Current Facility-Administered Medications  Medication Dose Route Frequency Provider Last Rate Last Dose  . 0.9 %  sodium chloride infusion  250 mL Intravenous PRN [name] A, MD      . acetaminophen (TYLENOL) tablet 650 mg  650 mg Oral Q4H PRN [name] A, MD      . allopurinol (ZYLOPRIM) tablet 100 mg  100 mg Oral Daily Arida, Muhammad A, MD      . amLODipine (NORVASC) tablet 10 mg  10 mg Oral Daily [name] A, MD      . aspirin EC tablet 81 mg  81 mg Oral Daily [name] A, MD      . atorvastatin (LIPITOR) tablet 80 mg  80 mg Oral q1800 [place] Hampshire, MD   80 mg at 12/04/16 2230  . B-complex with vitamin C tablet 1 tablet  1 tablet Oral Daily [name] A, MD      . calcitRIOL (ROCALTROL) capsule 0.25 mcg  0.25 mcg Oral Q T,Th,Sa-HD Alric Seton, PA-C      . carvedilol (COREG) tablet 3.125 mg  3.125 mg Oral BID WC [name] A, MD   3.125 mg at 12/04/16 2230  . clopidogrel (PLAVIX) tablet 75 mg  75 mg Oral Daily [name] A, MD      . famotidine (PEPCID) tablet 20 mg  20 mg Oral Daily [name] A, MD   20 mg at 12/04/16 2230  . feeding supplement (ENSURE ENLIVE) (ENSURE ENLIVE) liquid 237 mL  237 mL Oral BID BM Arida, Muhammad A, MD      . fentaNYL (SUBLIMAZE) injection 25 mcg  25 mcg Intravenous Q2H PRN [name] A, MD      . hydrALAZINE (APRESOLINE) tablet 50 mg  50 mg Oral 3 times per day [place] [facility], MD   50 mg at 12/04/16 2224  . ondansetron (ZOFRAN) injection 4 mg  4 mg Intravenous Q6H PRN [name] A, MD      . pantoprazole (PROTONIX) EC tablet 40 mg  40 mg Oral q1800 Wellington Hampshire, MD   40 mg at 12/04/16 2231  . sodium chloride flush (NS) 0.9 % injection 3 mL  3 mL Intravenous Q12H Wellington Hampshire, MD   3 mL at 12/04/16 2224  . sodium chloride flush (NS)  0.9 % injection 3 mL  3 mL Intravenous PRN Wellington Hampshire, MD       Labs: Basic Metabolic Panel:  Recent Labs Lab 12/05/16 0223  NA 134*  K 3.4*  CL 97*  CO2 26  GLUCOSE 94  BUN 22*  CREATININE 5.97*  CALCIUM 8.2*  PHOS 5.0*   CBC:  Recent Labs Lab 12/05/16 0223  WBC 6.4  HGB 9.5*  HCT 31.1*  MCV 90.9  PLT 201   ROS: As per HPI otherwise negative.   Physical Exam: Vitals:   12/04/16 2300 12/05/16 0300 12/05/16 0627 12/05/16 0700  BP: 116/83 130/64 131/70 123/70  Pulse: 73 71 70 71  Resp: 15 14 (!) 9 (!) 21  Temp:   98.7 F (37.1 C) 98.4 F (36.9 C)  TempSrc:   Oral Oral  SpO2: 98% 94% 98% 98%  Weight:   85 kg (187 lb 6.3 oz)   Height:         General: WDWN NAD eating breakfast Head: NCAT, exopthalmic, MMM Neck: Supple.  Lungs: CTA bilaterally without wheezes, rales, or rhonchi. Breathing is unlabored. Heart: RRR with S1 S2.  Abdomen: soft NT + BS Lower extremities:without edema Neuro: A & O  X 3. Moves all extremities spontaneously. Psych:  Responds to questions appropriately with a normal affect. Dialysis Access: right IJ  Dialysis Orders: [place] [facility] 4 hr 400/600 2 K 2.5 Ca right IJ no heparin except catheter (not clear why but likely due to August GIB), venofer 50 per week, mircera 200 last 9/11 no VDRA Recent labs: hgb 10.2 9/6 11% sat s/p Fe ferritin 1020 iPTH 869 - not on VDRA no Ca or P  Assessment/Plan: 1. CAD s/p PTCA and DES to mLAD- plavix and asa for 6 month  2. ESRD -  TTS - HD today before d/c - should be ok for tight heparin at d/c; no AVF - waiting till cardiac issues were resolved. 3. Hypertension/volume  - reassess EDW post HD; BP controlled; may need to reduce dose of hyralazine 4. Anemia  - hgb 9.5 - due for ESA redose next week - received weekly venofer 9/18 5. Metabolic bone disease -  iPTH - start calcitriol 0.25 - added on P to am labs was P - not on binders yet - will arrange for him to start after discharge after we see what  insurance covers - non Ca based binders 6. Nutrition - added multivit- 7. PAD - claudication stable per cards - get outpt ABIs 8.   Transition of Care- we will convey d/c information to his dialysis unit in [place] and [place] where he will ultimately return. Myriam Jacobson, PA-C [place] [phone] 12/05/2016, 8:41 AM   Pt seen, examined and agree w A/P as above.  Kelly Splinter MD [employer] pager [phone]   12/05/2016, 10:36 AM

## 2016-12-05 NOTE — Progress Notes (Signed)
Patient arrived to unit by bed.  Reviewed treatment plan and this RN agrees with plan.  Report received from bedside RN, Mickel Baas.  Consent obtained.  Patient A & O X 4.   Lung sounds diminished to ausculation in all fields. No edema. Cardiac:  NSR, 1st degree HB.  Removed caps and cleansed RIJ catheter with chlorhedxidine.  Aspirated ports of heparin and flushed them with saline per protocol.  Connected and secured lines, initiated treatment at 0953.  UF Goal of 2000 mL and net fluid removal 1.5 L.  Will continue to monitor.

## 2016-12-05 NOTE — Discharge Summary (Signed)
Discharge Summary    Patient ID: Roger [name],  MRN: [id number], DOB/AGE: Aug 13, 1940 76 y.o.  Admit date: 12/04/2016 Discharge date: 12/05/2016  Primary Care Provider: Patient, No Pcp Per Primary Cardiologist: Dr. Fletcher Anon   Discharge Diagnoses    Active Problems:   Angina pectoris ([place])   CAD    ESRD on HD TTHS   PAD   HTN   HLD    Allergies No Known Allergies  Diagnostic Studies/Procedures    CORONARY STENT INTERVENTION  Conclusion     Ost Cx to Prox Cx lesion, 100 %stenosed.  Prox RCA lesion, 100 %stenosed.  Prox LAD lesion, 40 %stenosed.  Ost 1st Diag to 1st Diag lesion, 70 %stenosed.  Mid RCA to Dist RCA lesion, 100 %stenosed.  Mid LAD lesion, 80 %stenosed.  Post intervention, there is a 0% residual stenosis.  A stent was successfully placed.  Ost 2nd Diag to 2nd Diag lesion, 60 %stenosed.  Successful angioplasty and drug-eluting stent placement to the mid LAD.  Recommendations: Dual antiplatelet therapy for at least 6 months. Aggressive treatment of risk factors. The patient's dialysis schedule is Tuesdays, Thursdays and Saturdays.  The patient seems to have significant disease affecting the right common femoral artery. Recommend an outpatient lower extremity arterial Doppler.    Diagnostic Diagram       Post-Intervention Diagram           History of Present Illness     76 y.o. male 76 year old male with a history of peripheral arterial disease, tobacco abuse, end-stage renal disease, anemia, paroxysmal atrial fibrillation, and recent GI bleed as well as non-STEMI with finding of multivessel coronary artery disease presented for schedule cath.   Patient was recently admitted to [name] on August 4 with epigastric pain and diaphoresis. He also been having melena for approximately 2-1/2 months. He was found to be anemic with a hemoglobin of 6.2. His creatinine was elevated at 5.7. Troponin was elevated at 3.4. ECG showed  inferolateral ST segment depressions. He required 3 units of packed red blood cells. He was seen by cardiology and stress testing was abnormal and suggestive of anterior, apical, and inferolateral ischemia. Echo showed normal LV function. Catheterization showed total occlusions of the left circumflex and right coronary arteries. There were left to right collaterals. The LAD had a 70% stenosis in the midsection of the vessel. In the setting of GI bleed, it was felt that an initial attempt at medical therapy would be appropriate and that if hemoglobin and hematocrit remained stable while on aspirin and Plavix, PCI of the LAD could be scheduled at a later date. EGD and colonoscopy were performed showing angiodysplasia or diverticulosis of the colon. No bleeding was found. Polypectomy was performed and pathology was consistent with tubular adenoma. Patient's hemoglobin and hematocrit were stable on aspirin and Plavix. In the setting of acute on chronic renal failure, a dialysis catheter was placed. Hemodialysis was initiated. He was seen by vascular surgery with recommendation for eventual permanent hemodialysis access however, from a cardiovascular standpoint, he was felt to be at least at moderate risk and therefore permanent dialysis access has been deferred until his LAD lesion is addressed.  Since discharge he has been living with his daughter in [place], [place]. Dialysis sessions have been Tuesday, Thursday, and Saturdays in [place] these were recently switched to a center in [place], at least until he is living independently again in [place].   Seen in clinic 11/26/16 by APP and outpatient cath arranged.  Hospital Course     Consultants: nephrology   1. CAD s/p PTCA and DES to mLAD - Detailed cath report as above. DAPT with ASA and Plavix for at least 6 months. Continue statin and BB. No chest pain. Ambulate without any discomfort.   2. ESRD on HD T,TH, Sat - The patient was seen by  nephrologist and underwent dialysis prior to discharge. Pending AV fistula--> once cardiac issue resolved.   3. PAD - Stable claudication. Outpatient ABIs.   4. HTN - Stable on current therapy.  5. HLD - 10/20/2016: Cholesterol 208; HDL 23; LDL Cholesterol 140; Triglycerides 225; VLDL 45  - Continue statin. Will need outpatient fasting lipid and LFTs  6. Recent GI bleed - H/H stable  The patient has been seen by Dr. Marlou Porch today and deemed ready for discharge home. All follow-up appointments have been scheduled. Discharge medications are listed below.  _____________   Discharge Vitals Blood pressure (!) 160/75, pulse 71, temperature (!) 97.5 F (36.4 C), resp. rate (!) 23, height 5\' 6"  (1.676 m), weight 180 lb 1.9 oz (81.7 kg), SpO2 100 %.  Filed Weights   12/05/16 0627 12/05/16 0940 12/05/16 1353  Weight: 187 lb 6.3 oz (85 kg) 183 lb 6.8 oz (83.2 kg) 180 lb 1.9 oz (81.7 kg)    Labs & Radiologic Studies     CBC  Recent Labs  12/05/16 0223  WBC 6.4  HGB 9.5*  HCT 31.1*  MCV 90.9  PLT 811   Basic Metabolic Panel  Recent Labs  12/05/16 0223  NA 134*  K 3.4*  CL 97*  CO2 26  GLUCOSE 94  BUN 22*  CREATININE 5.97*  CALCIUM 8.2*  PHOS 5.0*    Disposition   Pt is being discharged home today in good condition.  Follow-up Plans & Appointments    Follow-up Information    Wellington Hampshire, MD. Go on 12/17/2016.   Specialty:  Cardiology Why:  @1 :40 for post cath  Contact information: [place] [address] [phone]          Discharge Instructions    AMB Referral to Cardiac Rehabilitation - Phase II    Complete by:  As directed    Diagnosis:   Coronary Stents Stable Angina     Amb Referral to Cardiac Rehabilitation    Complete by:  As directed    Diagnosis:  Coronary Stents   Diet - low sodium heart healthy    Complete by:  As directed    Discharge instructions    Complete by:  As directed    No driving  for 48 hours. No lifting over 5 lbs for 1 week. No sexual activity for 1 week.Marland Kitchen Keep procedure site clean & dry. If you notice increased pain, swelling, bleeding or pus, call/return!  You may shower, but no soaking baths/hot tubs/pools for 1 week.    Some studies suggest Prilosec/Omeprazole interacts with Plavix. We changed your Prilosec/Omeprazole to Protonix for less chance of interaction.   Increase activity slowly    Complete by:  As directed       Discharge Medications   Current Discharge Medication List    START taking these medications   Details  calcitRIOL (ROCALTROL) 0.25 MCG capsule Take 1 capsule (0.25 mcg total) by mouth Every Tuesday,Thursday,and Saturday with dialysis. Qty: 30 capsule, Refills: 0    pantoprazole (PROTONIX) 40 MG tablet Take 1 tablet (40 mg total) by mouth daily at 6 PM. Qty: 30  tablet, Refills: 6      CONTINUE these medications which have NOT CHANGED   Details  allopurinol (ZYLOPRIM) 100 MG tablet Take 1 tablet (100 mg total) by mouth daily. Qty: 30 tablet, Refills: 0    amLODipine (NORVASC) 10 MG tablet Take 1 tablet (10 mg total) by mouth daily. Qty: 90 tablet, Refills: 3    aspirin EC 81 MG tablet Take 1 tablet (81 mg total) by mouth daily. Qty: 150 tablet, Refills: 0    atorvastatin (LIPITOR) 80 MG tablet Take 1 tablet (80 mg total) by mouth daily at 6 PM. Qty: 90 tablet, Refills: 3    b complex vitamins tablet Take 1 tablet by mouth at bedtime. (2300)    carvedilol (COREG) 3.125 MG tablet Take 1 tablet (3.125 mg total) by mouth 2 (two) times daily with a meal. Qty: 180 tablet, Refills: 3    clopidogrel (PLAVIX) 75 MG tablet Take 1 tablet (75 mg total) by mouth daily. Qty: 90 tablet, Refills: 3    hydrALAZINE (APRESOLINE) 50 MG tablet Take 1 tablet (50 mg total) by mouth every 8 (eight) hours. Qty: 270 tablet, Refills: 3    ranitidine (ZANTAC) 150 MG tablet Take 150 mg by mouth daily before breakfast.     multivitamin (RENA-VIT) TABS  tablet Take 1 tablet by mouth at bedtime. Qty: 30 tablet, Refills: 0      STOP taking these medications     esomeprazole (NEXIUM) 40 MG capsule          Outstanding Labs/Studies   None  Duration of Discharge Encounter   Greater than 30 minutes including physician time.  Signed, Bhagat,Bhavinkumar PA-C 12/05/2016, 2:42 PM  Personally seen and examined. Agree with above.  No chest pain,no shortness of breath  Exam: Alert, heart regular rate and rhythm, lungs are clear, no significant edema  76 year old male with coronary artery disease status post cardiac catheterizationwith stent placed to the mid LAD.  CAD  - Okay for discharge following hemodialysis.  - Dual antiplatelet therapy  PAD  - Continue with aggressive secondary prevention  ESRD  - per renal  Candee Furbish, MD

## 2016-12-05 NOTE — Progress Notes (Signed)
Dialysis treatment completed.  2000 mL ultrafiltrated and net fluid removal 1500 mL.    Patient A & O X 4. Lung sounds diminished to ausculation in all fields. Generalized edema. Cardiac: NSR.  Disconnected lines and removed needles.  Pressure held for 10 minutes and band aid/gauze dressing applied.  Report given to bedside RN, Mickel Baas.

## 2016-12-05 NOTE — Care Management Note (Signed)
Case Management Note  Patient Details  Name: Roger [name] MRN: [id number] Date of Birth: [birth year]  Subjective/Objective:  From home, ESRD , Tu, TH , Sat, s/p coronary stent intervention,will be on plavix and asa.                    Action/Plan: NCM will follow for dc needs.   Expected Discharge Date:                  Expected Discharge Plan:  Home/Self Care  In-House Referral:     Discharge planning Services  CM Consult  Post Acute Care Choice:    Choice offered to:     DME Arranged:    DME Agency:     HH Arranged:    [place] Agency:     Status of Service:  Completed, signed off  If discussed at [employer] of Stay Meetings, dates discussed:    Additional Comments:  Zenon Mayo, RN 12/05/2016, 9:13 AM

## 2016-12-05 NOTE — Progress Notes (Signed)
Progress Note  Patient Name: Roger [name] Date of Encounter: 12/05/2016  Primary Cardiologist: Dr. Fletcher Anon   Subjective   Feeling well. No chest pain, sob or palpitations.   Inpatient Medications    Scheduled Meds: . allopurinol  100 mg Oral Daily  . amLODipine  10 mg Oral Daily  . aspirin EC  81 mg Oral Daily  . atorvastatin  80 mg Oral q1800  . B-complex with vitamin C  1 tablet Oral Daily  . carvedilol  3.125 mg Oral BID WC  . clopidogrel  75 mg Oral Daily  . famotidine  20 mg Oral Daily  . feeding supplement (ENSURE ENLIVE)  237 mL Oral BID BM  . hydrALAZINE  50 mg Oral 3 times per day  . pantoprazole  40 mg Oral q1800  . sodium chloride flush  3 mL Intravenous Q12H   Continuous Infusions: . sodium chloride     PRN Meds: sodium chloride, acetaminophen, fentaNYL (SUBLIMAZE) injection, ondansetron (ZOFRAN) IV, sodium chloride flush   Vital Signs    Vitals:   12/04/16 2245 12/04/16 2300 12/05/16 0300 12/05/16 0627  BP: 132/80 116/83 130/64 131/70  Pulse: 74 73 71 70  Resp: 13 15 14  (!) 9  Temp:    98.7 F (37.1 C)  TempSrc:    Oral  SpO2: 98% 98% 94% 98%  Weight:    187 lb 6.3 oz (85 kg)  Height:        Intake/Output Summary (Last 24 hours) at 12/05/16 0709 Last data filed at 12/04/16 2224  Gross per 24 hour  Intake           244.86 ml  Output                0 ml  Net           244.86 ml   Filed Weights   12/04/16 1232 12/05/16 0627  Weight: 190 lb (86.2 kg) 187 lb 6.3 oz (85 kg)    Telemetry    Sinus rhythm - Personally Reviewed  ECG    SR at rate of 71 bpm, 1st AV block, QT/QTc 452/491 ms - Personally Reviewed  Physical Exam   GEN: No acute distress.   Neck: No JVD Cardiac: RRR, 2/6 systolic murmurs, rubs, or gallops. R radial cath site without hematoma.  Respiratory: Clear to auscultation bilaterally. GI: Soft, nontender, non-distended  MS: No edema; No deformity. Neuro:  Nonfocal  Psych: Normal affect   Labs    Chemistry Recent  Labs Lab 12/05/16 0223  NA 134*  K 3.4*  CL 97*  CO2 26  GLUCOSE 94  BUN 22*  CREATININE 5.97*  CALCIUM 8.2*  GFRNONAA 8*  GFRAA 10*  ANIONGAP 11     Hematology Recent Labs Lab 12/05/16 0223  WBC 6.4  RBC 3.42*  HGB 9.5*  HCT 31.1*  MCV 90.9  MCH 27.8  MCHC 30.5  RDW 19.9*  PLT 201     Radiology    No results found.  Cardiac Studies   CORONARY STENT INTERVENTION  Conclusion     Ost Cx to Prox Cx lesion, 100 %stenosed.  Prox RCA lesion, 100 %stenosed.  Prox LAD lesion, 40 %stenosed.  Ost 1st Diag to 1st Diag lesion, 70 %stenosed.  Mid RCA to Dist RCA lesion, 100 %stenosed.  Mid LAD lesion, 80 %stenosed.  Post intervention, there is a 0% residual stenosis.  A stent was successfully placed.  Ost 2nd Diag to 2nd Diag lesion, 60 %  stenosed.   Successful angioplasty and drug-eluting stent placement to the mid LAD.  Recommendations: Dual antiplatelet therapy for at least 6 months. Aggressive treatment of risk factors. The patient's dialysis schedule is Tuesdays, Thursdays and Saturdays.  The patient seems to have significant disease affecting the right common femoral artery. Recommend an outpatient lower extremity arterial Doppler.    Diagnostic Diagram       Post-Intervention Diagram           Patient Profile     76 y.o. male 77 year old male with a history of peripheral arterial disease, tobacco abuse, end-stage renal disease, anemia, paroxysmal atrial fibrillation, and recent GI bleed as well as non-STEMI with finding of multivessel coronary artery disease presented for schedule cath.   Patient was recently admitted to [name] on August 4 with epigastric pain and diaphoresis. He also been having melena for approximately 2-1/2 months. He was found to be anemic with a hemoglobin of 6.2. His creatinine was elevated at 5.7. Troponin was elevated at 3.4. ECG showed inferolateral ST segment depressions. He required 3 units of packed red  blood cells. He was seen by cardiology and stress testing was abnormal and suggestive of anterior, apical, and inferolateral ischemia. Echo showed normal LV function. Catheterization showed total occlusions of the left circumflex and right coronary arteries. There were left to right collaterals. The LAD had a 70% stenosis in the midsection of the vessel. In the setting of GI bleed, it was felt that an initial attempt at medical therapy would be appropriate and that if hemoglobin and hematocrit remained stable while on aspirin and Plavix, PCI of the LAD could be scheduled at a later date. EGD and colonoscopy were performed showing angiodysplasia or diverticulosis of the colon. No bleeding was found. Polypectomy was performed and pathology was consistent with tubular adenoma. Patient's hemoglobin and hematocrit were stable on aspirin and Plavix. In the setting of acute on chronic renal failure, a dialysis catheter was placed. Hemodialysis was initiated. He was seen by vascular surgery with recommendation for eventual permanent hemodialysis access however, from a cardiovascular standpoint, he was felt to be at least at moderate risk and therefore permanent dialysis access has been deferred until his LAD lesion is addressed.  Since discharge he has been living with his daughter in [place], [place].  Dialysis sessions have been Tuesday, Thursday, and Saturdays in [place] these were recently switched to a center in [place], at least until he is living independently again in [place].   Seen in clinic 11/26/16 by APP and outpatient cath arranged.   Assessment & Plan    1. CAD s/p PTCA and DES to mLAD - Detailed cath report as above. DAPT with ASA and Plavix for at least 6 months. Continue statin and BB. No chest pain. Ambulate.   2. ESRD on HD T,TH, Sat - Will get dialysis prior to discharge. Pending AV fistula.  3. PAD - Stable claudication. Outpatient ABIs.   4. HTN - Stable on current therapy.  5.  HLD - 10/20/2016: Cholesterol 208; HDL 23; LDL Cholesterol 140; Triglycerides 225; VLDL 45  - Continue statin. Will need outpatient fasting lipid and LFTs  6. Tobacco abuse - he has quit  7. Recent GI bleed - H/H stable  For questions or updates, please contact [place] Please consult [email or web address] for contact info under Cardiology/STEMI.      Signed, Leanor Kail, PA  12/05/2016, 7:09 AM     No chest pain, currently on hemodialysis, shortness of breath  Exam:  Alert, heart regular rate and rhythm, lungs are clear, no significant edema  76 year old male with coronary artery disease status post cardiac catheterizationwith stent placed to the mid LAD.  CAD  - Okay for discharge following hemodialysis.  - Dual antiplatelet therapy  PAD  - Continue with aggressive secondary prevention  ESRD  - per renal  Candee Furbish, MD

## 2016-12-05 NOTE — Progress Notes (Signed)
Discharge instructions (including medications) discussed with and copy provided to patient/caregiver 

## 2016-12-05 NOTE — Progress Notes (Signed)
Site area: right groin  Site Prior to Removal:  Level 0  Pressure Applied For 20 MINUTES    Minutes Beginning at 2155  Manual:   Yes.    Patient Status During Pull:  Stable   Post Pull Groin Site:  Level 0  Post Pull Instructions Given:  Yes.    Post Pull Pulses Present:  Yes.    Dressing Applied:  Yes.    Comments:  Bedrest over at 0415; groin site teaching reinforced.

## 2016-12-07 DIAGNOSIS — N186 End stage renal disease: Secondary | ICD-10-CM | POA: Diagnosis not present

## 2016-12-07 DIAGNOSIS — Z4901 Encounter for fitting and adjustment of extracorporeal dialysis catheter: Secondary | ICD-10-CM | POA: Diagnosis not present

## 2016-12-07 DIAGNOSIS — N2581 Secondary hyperparathyroidism of renal origin: Secondary | ICD-10-CM | POA: Diagnosis not present

## 2016-12-07 DIAGNOSIS — D509 Iron deficiency anemia, unspecified: Secondary | ICD-10-CM | POA: Diagnosis not present

## 2016-12-07 DIAGNOSIS — D631 Anemia in chronic kidney disease: Secondary | ICD-10-CM | POA: Diagnosis not present

## 2016-12-10 ENCOUNTER — Telehealth (HOSPITAL_COMMUNITY): Payer: Self-pay

## 2016-12-10 DIAGNOSIS — D631 Anemia in chronic kidney disease: Secondary | ICD-10-CM | POA: Diagnosis not present

## 2016-12-10 DIAGNOSIS — N186 End stage renal disease: Secondary | ICD-10-CM | POA: Diagnosis not present

## 2016-12-10 DIAGNOSIS — D509 Iron deficiency anemia, unspecified: Secondary | ICD-10-CM | POA: Diagnosis not present

## 2016-12-10 DIAGNOSIS — N2581 Secondary hyperparathyroidism of renal origin: Secondary | ICD-10-CM | POA: Diagnosis not present

## 2016-12-10 DIAGNOSIS — Z4901 Encounter for fitting and adjustment of extracorporeal dialysis catheter: Secondary | ICD-10-CM | POA: Diagnosis not present

## 2016-12-10 NOTE — Telephone Encounter (Signed)
Patient is insurance is active and benefits verified. Patient insurance is [employer] - $20.00 co-payment, no deductible, out of pocket $4400/2237.20 has been met, no co-insurance and no pre-authorization. Passport/reference #[id number]. °Hannah verified insurance.  ° °

## 2016-12-12 DIAGNOSIS — N2581 Secondary hyperparathyroidism of renal origin: Secondary | ICD-10-CM | POA: Diagnosis not present

## 2016-12-12 DIAGNOSIS — D509 Iron deficiency anemia, unspecified: Secondary | ICD-10-CM | POA: Diagnosis not present

## 2016-12-12 DIAGNOSIS — N186 End stage renal disease: Secondary | ICD-10-CM | POA: Diagnosis not present

## 2016-12-12 DIAGNOSIS — D631 Anemia in chronic kidney disease: Secondary | ICD-10-CM | POA: Diagnosis not present

## 2016-12-12 DIAGNOSIS — Z4901 Encounter for fitting and adjustment of extracorporeal dialysis catheter: Secondary | ICD-10-CM | POA: Diagnosis not present

## 2016-12-14 DIAGNOSIS — D631 Anemia in chronic kidney disease: Secondary | ICD-10-CM | POA: Diagnosis not present

## 2016-12-14 DIAGNOSIS — I25119 Atherosclerotic heart disease of native coronary artery with unspecified angina pectoris: Secondary | ICD-10-CM | POA: Diagnosis not present

## 2016-12-14 DIAGNOSIS — I2582 Chronic total occlusion of coronary artery: Secondary | ICD-10-CM | POA: Diagnosis not present

## 2016-12-14 DIAGNOSIS — N2581 Secondary hyperparathyroidism of renal origin: Secondary | ICD-10-CM | POA: Diagnosis not present

## 2016-12-14 DIAGNOSIS — Z955 Presence of coronary angioplasty implant and graft: Secondary | ICD-10-CM | POA: Diagnosis not present

## 2016-12-14 DIAGNOSIS — I252 Old myocardial infarction: Secondary | ICD-10-CM | POA: Diagnosis not present

## 2016-12-14 DIAGNOSIS — K5521 Angiodysplasia of colon with hemorrhage: Secondary | ICD-10-CM | POA: Diagnosis not present

## 2016-12-14 DIAGNOSIS — D649 Anemia, unspecified: Secondary | ICD-10-CM | POA: Diagnosis not present

## 2016-12-14 DIAGNOSIS — Z4901 Encounter for fitting and adjustment of extracorporeal dialysis catheter: Secondary | ICD-10-CM | POA: Diagnosis not present

## 2016-12-14 DIAGNOSIS — I440 Atrioventricular block, first degree: Secondary | ICD-10-CM | POA: Diagnosis not present

## 2016-12-14 DIAGNOSIS — K921 Melena: Secondary | ICD-10-CM | POA: Diagnosis not present

## 2016-12-14 DIAGNOSIS — I4581 Long QT syndrome: Secondary | ICD-10-CM | POA: Diagnosis not present

## 2016-12-14 DIAGNOSIS — N189 Chronic kidney disease, unspecified: Secondary | ICD-10-CM | POA: Diagnosis not present

## 2016-12-14 DIAGNOSIS — R918 Other nonspecific abnormal finding of lung field: Secondary | ICD-10-CM | POA: Diagnosis not present

## 2016-12-14 DIAGNOSIS — I120 Hypertensive chronic kidney disease with stage 5 chronic kidney disease or end stage renal disease: Secondary | ICD-10-CM | POA: Diagnosis not present

## 2016-12-14 DIAGNOSIS — N186 End stage renal disease: Secondary | ICD-10-CM | POA: Diagnosis not present

## 2016-12-14 DIAGNOSIS — K5730 Diverticulosis of large intestine without perforation or abscess without bleeding: Secondary | ICD-10-CM | POA: Diagnosis not present

## 2016-12-14 DIAGNOSIS — R079 Chest pain, unspecified: Secondary | ICD-10-CM | POA: Diagnosis not present

## 2016-12-14 DIAGNOSIS — Z7902 Long term (current) use of antithrombotics/antiplatelets: Secondary | ICD-10-CM | POA: Diagnosis not present

## 2016-12-14 DIAGNOSIS — D62 Acute posthemorrhagic anemia: Secondary | ICD-10-CM | POA: Diagnosis not present

## 2016-12-14 DIAGNOSIS — D509 Iron deficiency anemia, unspecified: Secondary | ICD-10-CM | POA: Diagnosis not present

## 2016-12-14 DIAGNOSIS — Z7982 Long term (current) use of aspirin: Secondary | ICD-10-CM | POA: Diagnosis not present

## 2016-12-14 DIAGNOSIS — R0789 Other chest pain: Secondary | ICD-10-CM | POA: Diagnosis not present

## 2016-12-14 DIAGNOSIS — Z992 Dependence on renal dialysis: Secondary | ICD-10-CM | POA: Diagnosis not present

## 2016-12-14 DIAGNOSIS — I2510 Atherosclerotic heart disease of native coronary artery without angina pectoris: Secondary | ICD-10-CM | POA: Diagnosis not present

## 2016-12-14 DIAGNOSIS — K219 Gastro-esophageal reflux disease without esophagitis: Secondary | ICD-10-CM | POA: Diagnosis not present

## 2016-12-15 DIAGNOSIS — I2510 Atherosclerotic heart disease of native coronary artery without angina pectoris: Secondary | ICD-10-CM | POA: Diagnosis not present

## 2016-12-15 DIAGNOSIS — K922 Gastrointestinal hemorrhage, unspecified: Secondary | ICD-10-CM | POA: Diagnosis not present

## 2016-12-15 DIAGNOSIS — D62 Acute posthemorrhagic anemia: Secondary | ICD-10-CM | POA: Diagnosis not present

## 2016-12-15 DIAGNOSIS — I129 Hypertensive chronic kidney disease with stage 1 through stage 4 chronic kidney disease, or unspecified chronic kidney disease: Secondary | ICD-10-CM | POA: Diagnosis not present

## 2016-12-15 DIAGNOSIS — D631 Anemia in chronic kidney disease: Secondary | ICD-10-CM | POA: Diagnosis not present

## 2016-12-15 DIAGNOSIS — Z992 Dependence on renal dialysis: Secondary | ICD-10-CM | POA: Diagnosis not present

## 2016-12-15 DIAGNOSIS — D500 Iron deficiency anemia secondary to blood loss (chronic): Secondary | ICD-10-CM | POA: Diagnosis not present

## 2016-12-15 DIAGNOSIS — R0789 Other chest pain: Secondary | ICD-10-CM | POA: Diagnosis not present

## 2016-12-15 DIAGNOSIS — N186 End stage renal disease: Secondary | ICD-10-CM | POA: Diagnosis not present

## 2016-12-15 DIAGNOSIS — R079 Chest pain, unspecified: Secondary | ICD-10-CM | POA: Diagnosis not present

## 2016-12-15 DIAGNOSIS — E649 Sequelae of unspecified nutritional deficiency: Secondary | ICD-10-CM | POA: Diagnosis not present

## 2016-12-15 DIAGNOSIS — K921 Melena: Secondary | ICD-10-CM | POA: Diagnosis not present

## 2016-12-15 DIAGNOSIS — N189 Chronic kidney disease, unspecified: Secondary | ICD-10-CM | POA: Diagnosis not present

## 2016-12-16 ENCOUNTER — Telehealth: Payer: Self-pay | Admitting: Cardiovascular Disease

## 2016-12-16 DIAGNOSIS — D631 Anemia in chronic kidney disease: Secondary | ICD-10-CM | POA: Diagnosis not present

## 2016-12-16 DIAGNOSIS — N186 End stage renal disease: Secondary | ICD-10-CM | POA: Diagnosis not present

## 2016-12-16 DIAGNOSIS — R0789 Other chest pain: Secondary | ICD-10-CM | POA: Diagnosis not present

## 2016-12-16 DIAGNOSIS — R079 Chest pain, unspecified: Secondary | ICD-10-CM | POA: Diagnosis not present

## 2016-12-16 DIAGNOSIS — K921 Melena: Secondary | ICD-10-CM | POA: Diagnosis not present

## 2016-12-16 DIAGNOSIS — Z720 Tobacco use: Secondary | ICD-10-CM | POA: Diagnosis not present

## 2016-12-16 DIAGNOSIS — I120 Hypertensive chronic kidney disease with stage 5 chronic kidney disease or end stage renal disease: Secondary | ICD-10-CM | POA: Diagnosis not present

## 2016-12-16 DIAGNOSIS — K922 Gastrointestinal hemorrhage, unspecified: Secondary | ICD-10-CM | POA: Diagnosis not present

## 2016-12-16 DIAGNOSIS — Z992 Dependence on renal dialysis: Secondary | ICD-10-CM | POA: Diagnosis not present

## 2016-12-16 DIAGNOSIS — I2510 Atherosclerotic heart disease of native coronary artery without angina pectoris: Secondary | ICD-10-CM | POA: Diagnosis not present

## 2016-12-16 NOTE — Telephone Encounter (Signed)
Pt daughter calling stating patient is in hospital in [place]  He is there for a GI bleed They have stopped patient's blood thinner in order to find the bleed But would like to know since patient just got stent placed is this safe and or if we have any concerns for patient   Would like a call back

## 2016-12-16 NOTE — Telephone Encounter (Signed)
I reviewed their notes and I agree with what they're doing.

## 2016-12-16 NOTE — Telephone Encounter (Signed)
Pt with Sept 19 angioplasty and drug-eluting stent placement to the mid LAD, taking plavix and aspirin. Scheduled for f/u w/Dr. Fletcher Anon October 2 Daughter called to make MD aware pt was admitted to [facility] on Saturday with a GI bleed, stating  aspirin has been discontinued d/t bleed. Because of recent stent, she is concerned for holding aspirin.   He c/o chest pain during dialysis and was transported to [place], noted as most likely chest pain related to anemia. Pt had been experiencing melena x 1 week and received 2 units pRBC over the weekend.  The daughter reports endoscopy showed no active bleed and source has not yet been located. He will receive dialysis again tomorrow before discharge to observe for any chest pain.   Notes can be reviewed in Care Everywhere which states plavix is being held but aspirin continued. He had 10/2 appt with Dr. Fletcher Anon which has been rescheduled for November. Daughter understands to call once pt has been discharged to schedule sooner appt w/cardiology. As she would like to make sure Dr. Fletcher Anon is aware, I will route to MD.

## 2016-12-17 ENCOUNTER — Ambulatory Visit: Payer: Medicare Other | Admitting: Cardiovascular Disease

## 2016-12-17 ENCOUNTER — Telehealth: Payer: Self-pay | Admitting: Cardiovascular Disease

## 2016-12-17 DIAGNOSIS — N186 End stage renal disease: Secondary | ICD-10-CM | POA: Diagnosis not present

## 2016-12-17 DIAGNOSIS — D631 Anemia in chronic kidney disease: Secondary | ICD-10-CM | POA: Diagnosis not present

## 2016-12-17 DIAGNOSIS — D509 Iron deficiency anemia, unspecified: Secondary | ICD-10-CM | POA: Diagnosis not present

## 2016-12-17 DIAGNOSIS — N2581 Secondary hyperparathyroidism of renal origin: Secondary | ICD-10-CM | POA: Diagnosis not present

## 2016-12-17 DIAGNOSIS — Z4901 Encounter for fitting and adjustment of extracorporeal dialysis catheter: Secondary | ICD-10-CM | POA: Diagnosis not present

## 2016-12-17 DIAGNOSIS — D689 Coagulation defect, unspecified: Secondary | ICD-10-CM | POA: Diagnosis not present

## 2016-12-17 NOTE — Telephone Encounter (Signed)
S/w pt's daughter, [name], who is agreeable to have pt f/u Oct 5, 2:40pm with Dr. Fletcher Anon. Daughter is appreciative

## 2016-12-17 NOTE — Telephone Encounter (Signed)
Pt's daughter, [name], called to update that patient has been discharged from [facility] and is currently home at this time.  Sept 19 cath w/stent placement. Had to cancel 10/2 f/u d/t admission to [facility]. Rescheduled Nov 7 but needs sooner appt. Will call daughter w/another appt.

## 2016-12-19 DIAGNOSIS — Z4901 Encounter for fitting and adjustment of extracorporeal dialysis catheter: Secondary | ICD-10-CM | POA: Diagnosis not present

## 2016-12-19 DIAGNOSIS — N186 End stage renal disease: Secondary | ICD-10-CM | POA: Diagnosis not present

## 2016-12-19 DIAGNOSIS — D631 Anemia in chronic kidney disease: Secondary | ICD-10-CM | POA: Diagnosis not present

## 2016-12-19 DIAGNOSIS — N2581 Secondary hyperparathyroidism of renal origin: Secondary | ICD-10-CM | POA: Diagnosis not present

## 2016-12-19 DIAGNOSIS — D509 Iron deficiency anemia, unspecified: Secondary | ICD-10-CM | POA: Diagnosis not present

## 2016-12-19 DIAGNOSIS — D689 Coagulation defect, unspecified: Secondary | ICD-10-CM | POA: Diagnosis not present

## 2016-12-20 ENCOUNTER — Ambulatory Visit (INDEPENDENT_AMBULATORY_CARE_PROVIDER_SITE_OTHER): Payer: Medicare Other | Admitting: Cardiovascular Disease

## 2016-12-20 ENCOUNTER — Encounter: Payer: Self-pay | Admitting: Cardiovascular Disease

## 2016-12-20 VITALS — BP 112/56 | HR 78 | Ht 66.0 in | Wt 183.5 lb

## 2016-12-20 DIAGNOSIS — E785 Hyperlipidemia, unspecified: Secondary | ICD-10-CM

## 2016-12-20 DIAGNOSIS — I2510 Atherosclerotic heart disease of native coronary artery without angina pectoris: Secondary | ICD-10-CM | POA: Diagnosis not present

## 2016-12-20 DIAGNOSIS — I739 Peripheral vascular disease, unspecified: Secondary | ICD-10-CM

## 2016-12-20 DIAGNOSIS — I10 Essential (primary) hypertension: Secondary | ICD-10-CM | POA: Diagnosis not present

## 2016-12-20 NOTE — Progress Notes (Signed)
Cardiology Office Note   Date:  12/20/2016   ID:  Roger [name], DOB [birth year], MRN [id number]  PCP:  Patient, No Pcp Per  Cardiologist:   [name], MD   Chief Complaint  Patient presents with  . other    Follow up from Cardiac Cath & stent placement. Meds reviewed by the pt. verbally. Pt. c/o frequent spells of burping. The pt. was at [facility] with a bleed last weekend.       History of Present Illness: Roger [name] is a 76 y.o. male who presents for a follow-up visit regarding coronary artery disease. He has extensive medical problems that include peripheral arterial disease, tobacco use, end-stage renal disease on hemodialysis, anemia, paroxysmal atrial fibrillation, recurrent GI bleed and coronary artery disease. He had unstable angina in August in the setting of GI bleed. Cardiac catheterization was done which showed significant three-vessel coronary artery disease with chronically occluded right coronary artery with left-to-right collaterals, chronically occluded proximal left circumflex which seem to be a small vessel with left to left collaterals and 70% stenosis in the mid LAD. LVEDP was moderately elevated. EF was normal by echo. I recommended medical therapy at that time with consideration of LAD PCI once he is able to tolerate dual antiplatelet therapy. He was placed on aspirin and Plavix and initially did fine. I proceeded with LAD PCI with drug-eluting stent placement on September 19. He was noted to have PAD affecting his right common femoral artery.  Unfortunately, he was hospitalized on September 29 at [facility] with GI bleed. He had an EGD done which overall was unremarkable. The patient required transfusion. Aspirin was stopped. Plavix was resumed. He denies any chest pain or shortness of breath. He continues to report melena.   Past Medical History:  Diagnosis Date  . Anemia   . Anginal pain ([place])   . Arthritis    "all over; bad in the legs"  (12/04/2016)  . CAD (coronary artery disease)    a. NSTEMI in setting of anemia; b. 10/2016 MV: inflat ST dep with large, reversible ant, apical, inflat defect; c. 10/2016 Cath: LM nl, LAD 40p, 29m, D1 70, D2 80, RI min irregs, LCX 100ost, RCA 100p, 172m/d, RPDA fills via collats from LAD, RPAV small-->Initial conservative Rx in setting of GIB w/ plan for PCI LAD if H/H stable on ASA/Plavix. d cath 12/04/16 s/p PTCA and DES to mLAD  . Diastolic dysfunction    a. 10/2016 Echo: EF 55-60%, no rwma, Gr2 DD, triv MR, mildly dil LA.  Marland Kitchen ESRD (end stage renal disease) ([place])    a. 10/2016 HD catheter insterted and HD initiated; b. Pending permanent HD access folllowing Cardiac Revascularization.  . ESRD on dialysis [facility])    "[place]; [address]; TTS usually; going to [facility] in [place], [place] right now while staying w/daughter" (12/04/2016)  . GERD (gastroesophageal reflux disease)   . GIB (gastrointestinal bleeding) 10/2011   a. 10/2016 3u PRBCs - EGD/Colonoscopy: angiodysplasia w/ diverticulosis. No apparent bleeding. Polypecotmy->tubular adenomas.  . Gout   . Heart murmur   . High cholesterol   . History of blood transfusion 10/18/2016   "related to anemia" (12/04/2016)  . Hypertension   . Iron deficiency anemia   . Myocardial infarction ([place]) 10/18/2016  . Old MI (myocardial infarction)    "found evidence of this on 10/18/2016"  . PAF (paroxysmal atrial fibrillation) ([place])   . Peripheral vascular disease ([place])    BLE  . Prostate  cancer [facility])    a. s/p seed implantation.  . Tobacco abuse     Past Surgical History:  Procedure Laterality Date  . CARDIAC CATHETERIZATION  10/2016  . COLONOSCOPY WITH PROPOFOL N/A 10/21/2016   Procedure: COLONOSCOPY WITH PROPOFOL;  Surgeon: Gatha Mayer, MD;  Location: [facility] ENDOSCOPY;  Service: Endoscopy;  Laterality: N/A;  . CORONARY ANGIOPLASTY WITH STENT PLACEMENT  12/04/2016   "1 stent"  . CORONARY STENT INTERVENTION N/A 12/04/2016   Procedure: CORONARY STENT  INTERVENTION;  Surgeon: Wellington Hampshire, MD;  Location: [place] CV LAB;  Service: Cardiovascular;  Laterality: N/A;  . ESOPHAGOGASTRODUODENOSCOPY N/A 10/21/2016   Procedure: ESOPHAGOGASTRODUODENOSCOPY (EGD);  Surgeon: Gatha Mayer, MD;  Location: [facility] ENDOSCOPY;  Service: Endoscopy;  Laterality: N/A;  . INSERTION OF DIALYSIS CATHETER Right 10/22/2016   Procedure: INSERTION OF TUNNELED DIALYSIS CATHETER;  Surgeon: Elam Dutch, MD;  Location: [place];  Service: Vascular;  Laterality: Right;  . LEFT HEART CATH AND CORONARY ANGIOGRAPHY N/A 10/23/2016   Procedure: LEFT HEART CATH AND CORONARY ANGIOGRAPHY;  Surgeon: Wellington Hampshire, MD;  Location: [place] CV LAB;  Service: Cardiovascular;  Laterality: N/A;  . TONSILLECTOMY       Current Outpatient Prescriptions  Medication Sig Dispense Refill  . allopurinol (ZYLOPRIM) 100 MG tablet Take 1 tablet (100 mg total) by mouth daily. 30 tablet 0  . amLODipine (NORVASC) 10 MG tablet Take 1 tablet (10 mg total) by mouth daily. 90 tablet 3  . atorvastatin (LIPITOR) 80 MG tablet Take 1 tablet (80 mg total) by mouth daily at 6 PM. 90 tablet 3  . b complex vitamins tablet Take 1 tablet by mouth at bedtime. (2300)    . calcitRIOL (ROCALTROL) 0.25 MCG capsule Take 1 capsule (0.25 mcg total) by mouth Every Tuesday,Thursday,and Saturday with dialysis. 30 capsule 0  . carvedilol (COREG) 3.125 MG tablet Take 1 tablet (3.125 mg total) by mouth 2 (two) times daily with a meal. 180 tablet 3  . clopidogrel (PLAVIX) 75 MG tablet Take 1 tablet (75 mg total) by mouth daily. 90 tablet 3  . hydrALAZINE (APRESOLINE) 50 MG tablet Take 1 tablet (50 mg total) by mouth every 8 (eight) hours. (Patient taking differently: Take 50 mg by mouth every 8 (eight) hours. (0800, 1700, 2300)) 270 tablet 3  . multivitamin (RENA-VIT) TABS tablet Take 1 tablet by mouth at bedtime. 30 tablet 0  . ranitidine (ZANTAC) 150 MG tablet Take 150 mg by mouth daily before breakfast.      No  current facility-administered medications for this visit.     Allergies:   Patient has no known allergies.    Social History:  The patient  reports that he quit smoking about 2 months ago. His smoking use included Cigarettes. He has a 25.00 pack-year smoking history. He has never used smokeless tobacco. He reports that he does not drink alcohol or use drugs.   Family History:  The patient's family history includes Cancer in his brother, father, mother, and sister; Diabetes in his brother and father; Heart attack in his father; Hypertension in his brother, daughter, and father.    ROS:  Please see the history of present illness.   Otherwise, review of systems are positive for none.   All other systems are reviewed and negative.    PHYSICAL EXAM: VS:  BP (!) 112/56 (BP Location: Left Arm, Patient Position: Sitting, Cuff Size: Normal)   Pulse 78   Ht 5\' 6"  (1.676 m)   Wt  183 lb 8 oz (83.2 kg)   SpO2 98%   BMI 29.62 kg/m  , BMI Body mass index is 29.62 kg/m. GEN: Well nourished, well developed, in no acute distress  HEENT: normal  Neck: no JVD, carotid bruits, or masses Cardiac: RRR; no  rubs, or gallops,no edema . 2/6 systolic ejection murmur at the aortic area Respiratory:  clear to auscultation bilaterally, normal work of breathing GI: soft, nontender, nondistended, + BS MS: no deformity or atrophy  Skin: warm and dry, no rash Neuro:  Strength and sensation are intact Psych: euthymic mood, full affect   EKG:  EKG is ordered today. The ekg ordered today demonstrates normal sinus rhythm with first degree AV block. Possible old inferior infarct.   Recent Labs: 10/19/2016: Magnesium 2.0; TSH 2.539 10/21/2016: ALT 9 12/05/2016: BUN 22; Creatinine, Ser 5.97; Hemoglobin 9.5; Platelets 201; Potassium 3.4; Sodium 134    Lipid Panel    Component Value Date/Time   CHOL 208 (H) 10/20/2016 0228   TRIG 225 (H) 10/20/2016 0228   HDL 23 (L) 10/20/2016 0228   CHOLHDL 9.0 10/20/2016 0228     VLDL 45 (H) 10/20/2016 0228   LDLCALC 140 (H) 10/20/2016 0228      Wt Readings from Last 3 Encounters:  12/20/16 183 lb 8 oz (83.2 kg)  12/05/16 180 lb 1.9 oz (81.7 kg)  11/26/16 190 lb 4 oz (86.3 kg)     No flowsheet data found.    ASSESSMENT AND PLAN:  1.  Coronary artery disease involving native coronary arteries without angina: The patient is status post angioplasty and drug-eluting stent placement to the mid LAD in September 2018. Unfortunately, he is having GI bleed which has been difficult to manage. I agree with stopping aspirin and keeping him on Plavix 75 mg once daily. However, if this GI bleed cannot be controlled, even Plavix might have to be stopped which is not ideal considering the risk of stent thrombosis.  2. Blood loss anemia: I recommend that he gets CBC checked with dialysis at least once weekly and recommend transfusion to keep hemoglobin above 7. This will be communicated with his dialysis unit.  3. Hyperlipidemia: Continue high dose atorvastatin.  4. Essential hypertension: Blood pressure is controlled on current medications.  5. Peripheral arterial disease: Continue medical therapy. No evidence of limb ischemia.  Disposition:   FU with NP in 1 month.   Signed,  [name], MD  12/20/2016 3:36 PM    [place]

## 2016-12-20 NOTE — Patient Instructions (Addendum)
Medication Instructions:  Your physician recommends that you continue on your current medications as directed. Please refer to the Current Medication list given to you today.   Labwork: none  Testing/Procedures: None.  Follow-Up: Your physician recommends that you keep your follow-up appointment in: 1 month with Ignacia Bayley, NP.    Any Other Special Instructions Will Be Listed Below (If Applicable).     If you need a refill on your cardiac medications before your next appointment, please call your pharmacy.

## 2016-12-21 DIAGNOSIS — D689 Coagulation defect, unspecified: Secondary | ICD-10-CM | POA: Diagnosis not present

## 2016-12-21 DIAGNOSIS — D631 Anemia in chronic kidney disease: Secondary | ICD-10-CM | POA: Diagnosis not present

## 2016-12-21 DIAGNOSIS — N186 End stage renal disease: Secondary | ICD-10-CM | POA: Diagnosis not present

## 2016-12-21 DIAGNOSIS — D509 Iron deficiency anemia, unspecified: Secondary | ICD-10-CM | POA: Diagnosis not present

## 2016-12-21 DIAGNOSIS — N2581 Secondary hyperparathyroidism of renal origin: Secondary | ICD-10-CM | POA: Diagnosis not present

## 2016-12-21 DIAGNOSIS — Z4901 Encounter for fitting and adjustment of extracorporeal dialysis catheter: Secondary | ICD-10-CM | POA: Diagnosis not present

## 2016-12-24 DIAGNOSIS — N186 End stage renal disease: Secondary | ICD-10-CM | POA: Diagnosis not present

## 2016-12-24 DIAGNOSIS — D631 Anemia in chronic kidney disease: Secondary | ICD-10-CM | POA: Diagnosis not present

## 2016-12-24 DIAGNOSIS — Z4901 Encounter for fitting and adjustment of extracorporeal dialysis catheter: Secondary | ICD-10-CM | POA: Diagnosis not present

## 2016-12-24 DIAGNOSIS — D509 Iron deficiency anemia, unspecified: Secondary | ICD-10-CM | POA: Diagnosis not present

## 2016-12-24 DIAGNOSIS — N2581 Secondary hyperparathyroidism of renal origin: Secondary | ICD-10-CM | POA: Diagnosis not present

## 2016-12-24 DIAGNOSIS — D689 Coagulation defect, unspecified: Secondary | ICD-10-CM | POA: Diagnosis not present

## 2016-12-26 DIAGNOSIS — Z4901 Encounter for fitting and adjustment of extracorporeal dialysis catheter: Secondary | ICD-10-CM | POA: Diagnosis not present

## 2016-12-26 DIAGNOSIS — N186 End stage renal disease: Secondary | ICD-10-CM | POA: Diagnosis not present

## 2016-12-26 DIAGNOSIS — N2581 Secondary hyperparathyroidism of renal origin: Secondary | ICD-10-CM | POA: Diagnosis not present

## 2016-12-26 DIAGNOSIS — D509 Iron deficiency anemia, unspecified: Secondary | ICD-10-CM | POA: Diagnosis not present

## 2016-12-26 DIAGNOSIS — D689 Coagulation defect, unspecified: Secondary | ICD-10-CM | POA: Diagnosis not present

## 2016-12-26 DIAGNOSIS — D631 Anemia in chronic kidney disease: Secondary | ICD-10-CM | POA: Diagnosis not present

## 2016-12-28 DIAGNOSIS — D689 Coagulation defect, unspecified: Secondary | ICD-10-CM | POA: Diagnosis not present

## 2016-12-28 DIAGNOSIS — D631 Anemia in chronic kidney disease: Secondary | ICD-10-CM | POA: Diagnosis not present

## 2016-12-28 DIAGNOSIS — Z4901 Encounter for fitting and adjustment of extracorporeal dialysis catheter: Secondary | ICD-10-CM | POA: Diagnosis not present

## 2016-12-28 DIAGNOSIS — N186 End stage renal disease: Secondary | ICD-10-CM | POA: Diagnosis not present

## 2016-12-28 DIAGNOSIS — N2581 Secondary hyperparathyroidism of renal origin: Secondary | ICD-10-CM | POA: Diagnosis not present

## 2016-12-28 DIAGNOSIS — D509 Iron deficiency anemia, unspecified: Secondary | ICD-10-CM | POA: Diagnosis not present

## 2016-12-31 DIAGNOSIS — D689 Coagulation defect, unspecified: Secondary | ICD-10-CM | POA: Diagnosis not present

## 2016-12-31 DIAGNOSIS — Z4901 Encounter for fitting and adjustment of extracorporeal dialysis catheter: Secondary | ICD-10-CM | POA: Diagnosis not present

## 2016-12-31 DIAGNOSIS — D509 Iron deficiency anemia, unspecified: Secondary | ICD-10-CM | POA: Diagnosis not present

## 2016-12-31 DIAGNOSIS — D631 Anemia in chronic kidney disease: Secondary | ICD-10-CM | POA: Diagnosis not present

## 2016-12-31 DIAGNOSIS — N2581 Secondary hyperparathyroidism of renal origin: Secondary | ICD-10-CM | POA: Diagnosis not present

## 2016-12-31 DIAGNOSIS — N186 End stage renal disease: Secondary | ICD-10-CM | POA: Diagnosis not present

## 2017-01-02 DIAGNOSIS — D689 Coagulation defect, unspecified: Secondary | ICD-10-CM | POA: Diagnosis not present

## 2017-01-02 DIAGNOSIS — D631 Anemia in chronic kidney disease: Secondary | ICD-10-CM | POA: Diagnosis not present

## 2017-01-02 DIAGNOSIS — N2581 Secondary hyperparathyroidism of renal origin: Secondary | ICD-10-CM | POA: Diagnosis not present

## 2017-01-02 DIAGNOSIS — Z4901 Encounter for fitting and adjustment of extracorporeal dialysis catheter: Secondary | ICD-10-CM | POA: Diagnosis not present

## 2017-01-02 DIAGNOSIS — D509 Iron deficiency anemia, unspecified: Secondary | ICD-10-CM | POA: Diagnosis not present

## 2017-01-02 DIAGNOSIS — N186 End stage renal disease: Secondary | ICD-10-CM | POA: Diagnosis not present

## 2017-01-04 DIAGNOSIS — N2581 Secondary hyperparathyroidism of renal origin: Secondary | ICD-10-CM | POA: Diagnosis not present

## 2017-01-04 DIAGNOSIS — N186 End stage renal disease: Secondary | ICD-10-CM | POA: Diagnosis not present

## 2017-01-04 DIAGNOSIS — D631 Anemia in chronic kidney disease: Secondary | ICD-10-CM | POA: Diagnosis not present

## 2017-01-04 DIAGNOSIS — Z4901 Encounter for fitting and adjustment of extracorporeal dialysis catheter: Secondary | ICD-10-CM | POA: Diagnosis not present

## 2017-01-04 DIAGNOSIS — D509 Iron deficiency anemia, unspecified: Secondary | ICD-10-CM | POA: Diagnosis not present

## 2017-01-04 DIAGNOSIS — D689 Coagulation defect, unspecified: Secondary | ICD-10-CM | POA: Diagnosis not present

## 2017-01-07 DIAGNOSIS — N186 End stage renal disease: Secondary | ICD-10-CM | POA: Diagnosis not present

## 2017-01-07 DIAGNOSIS — D509 Iron deficiency anemia, unspecified: Secondary | ICD-10-CM | POA: Diagnosis not present

## 2017-01-07 DIAGNOSIS — D631 Anemia in chronic kidney disease: Secondary | ICD-10-CM | POA: Diagnosis not present

## 2017-01-07 DIAGNOSIS — Z4901 Encounter for fitting and adjustment of extracorporeal dialysis catheter: Secondary | ICD-10-CM | POA: Diagnosis not present

## 2017-01-07 DIAGNOSIS — N2581 Secondary hyperparathyroidism of renal origin: Secondary | ICD-10-CM | POA: Diagnosis not present

## 2017-01-07 DIAGNOSIS — D689 Coagulation defect, unspecified: Secondary | ICD-10-CM | POA: Diagnosis not present

## 2017-01-09 DIAGNOSIS — D689 Coagulation defect, unspecified: Secondary | ICD-10-CM | POA: Diagnosis not present

## 2017-01-09 DIAGNOSIS — D631 Anemia in chronic kidney disease: Secondary | ICD-10-CM | POA: Diagnosis not present

## 2017-01-09 DIAGNOSIS — D509 Iron deficiency anemia, unspecified: Secondary | ICD-10-CM | POA: Diagnosis not present

## 2017-01-09 DIAGNOSIS — N2581 Secondary hyperparathyroidism of renal origin: Secondary | ICD-10-CM | POA: Diagnosis not present

## 2017-01-09 DIAGNOSIS — N186 End stage renal disease: Secondary | ICD-10-CM | POA: Diagnosis not present

## 2017-01-09 DIAGNOSIS — Z4901 Encounter for fitting and adjustment of extracorporeal dialysis catheter: Secondary | ICD-10-CM | POA: Diagnosis not present

## 2017-01-10 DIAGNOSIS — N186 End stage renal disease: Secondary | ICD-10-CM | POA: Diagnosis not present

## 2017-01-10 DIAGNOSIS — Z87891 Personal history of nicotine dependence: Secondary | ICD-10-CM | POA: Diagnosis not present

## 2017-01-10 DIAGNOSIS — Z8719 Personal history of other diseases of the digestive system: Secondary | ICD-10-CM | POA: Diagnosis not present

## 2017-01-10 DIAGNOSIS — I10 Essential (primary) hypertension: Secondary | ICD-10-CM | POA: Diagnosis not present

## 2017-01-10 DIAGNOSIS — I25118 Atherosclerotic heart disease of native coronary artery with other forms of angina pectoris: Secondary | ICD-10-CM | POA: Diagnosis not present

## 2017-01-11 DIAGNOSIS — D689 Coagulation defect, unspecified: Secondary | ICD-10-CM | POA: Diagnosis not present

## 2017-01-11 DIAGNOSIS — N2581 Secondary hyperparathyroidism of renal origin: Secondary | ICD-10-CM | POA: Diagnosis not present

## 2017-01-11 DIAGNOSIS — D509 Iron deficiency anemia, unspecified: Secondary | ICD-10-CM | POA: Diagnosis not present

## 2017-01-11 DIAGNOSIS — N186 End stage renal disease: Secondary | ICD-10-CM | POA: Diagnosis not present

## 2017-01-11 DIAGNOSIS — Z4901 Encounter for fitting and adjustment of extracorporeal dialysis catheter: Secondary | ICD-10-CM | POA: Diagnosis not present

## 2017-01-11 DIAGNOSIS — D631 Anemia in chronic kidney disease: Secondary | ICD-10-CM | POA: Diagnosis not present

## 2017-01-14 DIAGNOSIS — Z4901 Encounter for fitting and adjustment of extracorporeal dialysis catheter: Secondary | ICD-10-CM | POA: Diagnosis not present

## 2017-01-14 DIAGNOSIS — D631 Anemia in chronic kidney disease: Secondary | ICD-10-CM | POA: Diagnosis not present

## 2017-01-14 DIAGNOSIS — N186 End stage renal disease: Secondary | ICD-10-CM | POA: Diagnosis not present

## 2017-01-14 DIAGNOSIS — D509 Iron deficiency anemia, unspecified: Secondary | ICD-10-CM | POA: Diagnosis not present

## 2017-01-14 DIAGNOSIS — D689 Coagulation defect, unspecified: Secondary | ICD-10-CM | POA: Diagnosis not present

## 2017-01-14 DIAGNOSIS — N2581 Secondary hyperparathyroidism of renal origin: Secondary | ICD-10-CM | POA: Diagnosis not present

## 2017-01-15 DIAGNOSIS — N186 End stage renal disease: Secondary | ICD-10-CM | POA: Diagnosis not present

## 2017-01-15 DIAGNOSIS — Z992 Dependence on renal dialysis: Secondary | ICD-10-CM | POA: Diagnosis not present

## 2017-01-16 DIAGNOSIS — N186 End stage renal disease: Secondary | ICD-10-CM | POA: Diagnosis not present

## 2017-01-16 DIAGNOSIS — R079 Chest pain, unspecified: Secondary | ICD-10-CM | POA: Diagnosis not present

## 2017-01-16 DIAGNOSIS — D631 Anemia in chronic kidney disease: Secondary | ICD-10-CM | POA: Diagnosis not present

## 2017-01-16 DIAGNOSIS — D689 Coagulation defect, unspecified: Secondary | ICD-10-CM | POA: Diagnosis not present

## 2017-01-16 DIAGNOSIS — Z4901 Encounter for fitting and adjustment of extracorporeal dialysis catheter: Secondary | ICD-10-CM | POA: Diagnosis not present

## 2017-01-16 DIAGNOSIS — N2581 Secondary hyperparathyroidism of renal origin: Secondary | ICD-10-CM | POA: Diagnosis not present

## 2017-01-18 DIAGNOSIS — N186 End stage renal disease: Secondary | ICD-10-CM | POA: Diagnosis not present

## 2017-01-18 DIAGNOSIS — D631 Anemia in chronic kidney disease: Secondary | ICD-10-CM | POA: Diagnosis not present

## 2017-01-18 DIAGNOSIS — D689 Coagulation defect, unspecified: Secondary | ICD-10-CM | POA: Diagnosis not present

## 2017-01-18 DIAGNOSIS — R079 Chest pain, unspecified: Secondary | ICD-10-CM | POA: Diagnosis not present

## 2017-01-18 DIAGNOSIS — Z4901 Encounter for fitting and adjustment of extracorporeal dialysis catheter: Secondary | ICD-10-CM | POA: Diagnosis not present

## 2017-01-18 DIAGNOSIS — N2581 Secondary hyperparathyroidism of renal origin: Secondary | ICD-10-CM | POA: Diagnosis not present

## 2017-01-21 DIAGNOSIS — R079 Chest pain, unspecified: Secondary | ICD-10-CM | POA: Diagnosis not present

## 2017-01-21 DIAGNOSIS — D689 Coagulation defect, unspecified: Secondary | ICD-10-CM | POA: Diagnosis not present

## 2017-01-21 DIAGNOSIS — N2581 Secondary hyperparathyroidism of renal origin: Secondary | ICD-10-CM | POA: Diagnosis not present

## 2017-01-21 DIAGNOSIS — D631 Anemia in chronic kidney disease: Secondary | ICD-10-CM | POA: Diagnosis not present

## 2017-01-21 DIAGNOSIS — Z4901 Encounter for fitting and adjustment of extracorporeal dialysis catheter: Secondary | ICD-10-CM | POA: Diagnosis not present

## 2017-01-21 DIAGNOSIS — N186 End stage renal disease: Secondary | ICD-10-CM | POA: Diagnosis not present

## 2017-01-22 ENCOUNTER — Ambulatory Visit: Payer: Medicare Other | Admitting: Nurse Practitioner

## 2017-01-22 ENCOUNTER — Encounter: Payer: Self-pay | Admitting: Nurse Practitioner

## 2017-01-22 VITALS — BP 124/60 | HR 72 | Ht 66.0 in | Wt 181.5 lb

## 2017-01-22 DIAGNOSIS — R0989 Other specified symptoms and signs involving the circulatory and respiratory systems: Secondary | ICD-10-CM | POA: Diagnosis not present

## 2017-01-22 DIAGNOSIS — I10 Essential (primary) hypertension: Secondary | ICD-10-CM

## 2017-01-22 DIAGNOSIS — N186 End stage renal disease: Secondary | ICD-10-CM

## 2017-01-22 DIAGNOSIS — I25119 Atherosclerotic heart disease of native coronary artery with unspecified angina pectoris: Secondary | ICD-10-CM | POA: Diagnosis not present

## 2017-01-22 DIAGNOSIS — Z8719 Personal history of other diseases of the digestive system: Secondary | ICD-10-CM | POA: Diagnosis not present

## 2017-01-22 DIAGNOSIS — E785 Hyperlipidemia, unspecified: Secondary | ICD-10-CM | POA: Diagnosis not present

## 2017-01-22 NOTE — Progress Notes (Signed)
Office Visit    Patient Name: Roger [name] Date of Encounter: 01/22/2017  Primary Care Provider:  Patient, No Pcp Per Primary Cardiologist:  M. Fletcher Anon, MD   Chief Complaint    76 year old male with a history of peripheral arterial disease, tobacco abuse, end-stage renal disease, anemia, paroxysmal atrial fibrillation, CAD status post LAD stenting, and recurrent GI bleed, who presents for follow-up.  Past Medical History    Past Medical History:  Diagnosis Date  . Anemia   . Anginal pain ([place])   . Arthritis    "all over; bad in the legs" (12/04/2016)  . CAD (coronary artery disease)    a. NSTEMI in setting of anemia; b. 10/2016 MV: inflat ST dep with large, reversible ant, apical, inflat defect; c. 10/2016 Cath: LM nl, LAD 40p, 61m, D1 70, D2 80, RI min irregs, LCX 100ost, RCA 100p, 152m/d, RPDA fills via collats from LAD, RPAV small-->Initial conservative Rx in setting of GIB w/ plan for PCI LAD if H/H stable on ASA/Plavix;  d. 12/04/16 s/p PTCA and DES to mLAD.  Marland Kitchen Diastolic dysfunction    a. 10/2016 Echo: EF 55-60%, no rwma, Gr2 DD, triv MR, mildly dil LA.  Marland Kitchen ESRD (end stage renal disease) ([place])    a. 10/2016 HD catheter insterted and HD initiated; b. Pending permanent HD access folllowing Cardiac Revascularization.  . ESRD on dialysis [facility])    "[place]; [address]; TTS usually; going to [facility] in [place], [place] right now while staying w/daughter" (12/04/2016)  . GERD (gastroesophageal reflux disease)   . GIB (gastrointestinal bleeding) 10/2011   a. 10/2016 3u PRBCs - EGD/Colonoscopy: angiodysplasia w/ diverticulosis. No apparent bleeding. Polypecotmy->tubular adenomas.  . Gout   . Heart murmur   . High cholesterol   . History of blood transfusion 10/18/2016   "related to anemia" (12/04/2016)  . Hypertension   . Iron deficiency anemia   . Myocardial infarction ([place]) 10/18/2016  . Old MI (myocardial infarction)    "found evidence of this on 10/18/2016"  . PAF (paroxysmal atrial  fibrillation) ([place])   . Peripheral vascular disease ([place])    BLE  . Prostate cancer ([place])    a. s/p seed implantation.  . Tobacco abuse    Past Surgical History:  Procedure Laterality Date  . CARDIAC CATHETERIZATION  10/2016  . CORONARY ANGIOPLASTY WITH STENT PLACEMENT  12/04/2016   "1 stent"  . TONSILLECTOMY      Allergies  No Known Allergies  History of Present Illness    76 year old male with the above complex past medical history including peripheral arterial disease, tobacco abuse, chronic kidney disease, anemia, gout, GERD, paroxysmal atrial fibrillation, history of DVT, prostate cancer, CAD status post stenting of the mid LAD in September 2018, and recurrent GI bleeding.  In August of this year, he was admitted with GI bleeding and non-STEMI.  Also suffered from acute kidney injury.  Stress testing was abnormal and catheterization showed a total occlusion of the left circumflex and right coronary arteries.  There were left to right collaterals.  The LAD had a 70% stenosis in the midsection of the vessel.  In the setting of GI bleed, EGD and colonoscopy were performed showed angiodysplasia and diverticulosis of the colon.  No bleeding was found.  Polypectomy was performed and pathology was consistent with tubular adenoma.  H&H were stable and patient was placed on aspirin and Plavix with plan for initial conservative therapy.  He follow-up in clinic on November 26, 2016 and  H&H were stable.  At that point, decision was made to pursue PCI of the LAD.  This was subsequently performed September 19.  Unfortunately, he was hospitalized at [facility] in [place] with GI bleed on September 29.  Aspirin was discontinued.  EGD was unremarkable.  He has been maintained on Plavix.  He followed up with Dr. Fletcher Anon October 5.  Since that visit, he has been doing well.  He continues to experience belching/hiccoughs during dialysis sessions.  This sometimes wakes him up at night also.  He has not had any  exertional chest pain, dyspnea, palpitations, PND, orthopnea, dizziness, syncope, edema, or early satiety.  Home Medications    Prior to Admission medications   Medication Sig Start Date End Date Taking? Authorizing Provider  allopurinol (ZYLOPRIM) 100 MG tablet Take 1 tablet (100 mg total) by mouth daily. 10/26/16  Yes Florencia Reasons, MD  amLODipine (NORVASC) 10 MG tablet Take 1 tablet (10 mg total) by mouth daily. 11/26/16  Yes Rogelia Mire, NP  atorvastatin (LIPITOR) 80 MG tablet Take 1 tablet (80 mg total) by mouth daily at 6 PM. 11/26/16  Yes Rogelia Mire, NP  b complex vitamins tablet Take 1 tablet by mouth at bedtime. (2300)   Yes [provider]  calcitRIOL (ROCALTROL) 0.25 MCG capsule Take 1 capsule (0.25 mcg total) by mouth Every Tuesday,Thursday,and Saturday with dialysis. 12/05/16  Yes Bhagat, Bhavinkumar, PA  carvedilol (COREG) 3.125 MG tablet Take 1 tablet (3.125 mg total) by mouth 2 (two) times daily with a meal. 11/26/16  Yes Rogelia Mire, NP  clopidogrel (PLAVIX) 75 MG tablet Take 1 tablet (75 mg total) by mouth daily. 11/26/16  Yes Rogelia Mire, NP  hydrALAZINE (APRESOLINE) 50 MG tablet Take 1 tablet (50 mg total) by mouth every 8 (eight) hours. Patient taking differently: Take 50 mg by mouth every 8 (eight) hours. (0800, 1700, 2300) 11/26/16  Yes Rogelia Mire, NP  ranitidine (ZANTAC) 150 MG tablet Take 150 mg by mouth daily before breakfast.    Yes [provider]    Review of Systems    Belching and hiccups that typically occur in the last half of dialysis sessions.  He denies chest pain, palpitations, dyspnea, PND, orthopnea, dizziness, syncope, edema, or early satiety.  All other systems reviewed and are otherwise negative except as noted above.  Physical Exam    VS:  BP 124/60 (BP Location: Left Arm, Patient Position: Sitting, Cuff Size: Normal)   Pulse 72   Ht 5\' 6"  (1.676 m)   Wt 181 lb 8 oz (82.3 kg)   BMI 29.29 kg/m   , BMI Body mass index is 29.29 kg/m. GEN: Well nourished, well developed, in no acute distress.  HEENT: normal.  Neck: Supple, no JVD, bilateral carotid bruits, no masses. Cardiac: RRR, 2/6 systolic ejection murmur at the upper sternal borders, no rubs, or gallops. No clubbing, cyanosis, edema.  Radials/DP/PT 1+ and equal bilaterally.  Respiratory:  Respirations regular and unlabored, clear to auscultation bilaterally. GI: Soft, nontender, nondistended, BS + x 4. MS: no deformity or atrophy. Skin: warm and dry, no rash. Neuro:  Strength and sensation are intact. Psych: Normal affect.  Accessory Clinical Findings    Lab Results  Component Value Date   WBC 6.4 12/05/2016   HGB 9.5 (L) 12/05/2016   HCT 31.1 (L) 12/05/2016   MCV 90.9 12/05/2016   PLT 201 12/05/2016    Assessment & Plan    1.  Coronary artery disease:  Patient has been doing well over the past month without any significant chest pain or dyspnea.  He is not particularly active.  He does experience belching and hiccups during dialysis.  He thinks that may be this is an anginal equivalent, though there are no associated symptoms and he never experiences the symptoms with activity during the day otherwise.  I recommend he try simethicone to see if this helps.  He remains on beta-blocker, Plavix, and statin therapy.  2.  GI bleed/acute blood loss anemia: He has been doing well over the past month without any obvious bleeding.  CBC is followed at hemodialysis and he has not required any transfusions.  3.  Essential hypertension: Stable.  4.  Hyperlipidemia: Continue high potency statin therapy.  LDL was 140 in August and we will need to arrange for follow-up when he is fasting at his next visit.  5.  Peripheral arterial disease: Known right common femoral disease.  Currently stable.  He remains on Plavix and statin therapy.  6.  Carotid bruits: Follow-up ultrasound.  7.  End-stage renal disease: On dialysis.  8.   Disposition: Follow-up in clinic in 3 months or sooner if necessary.  [name], NP 01/22/2017, 2:37 PM

## 2017-01-22 NOTE — Patient Instructions (Signed)
Medication Instructions:  Your physician recommends that you continue on your current medications as directed. Please refer to the Current Medication list given to you today.   Labwork: none  Testing/Procedures: none  Follow-Up: Your physician recommends that you schedule a follow-up appointment in: 3 months with Dr. Fletcher Anon   Any Other Special Instructions Will Be Listed Below (If Applicable).     If you need a refill on your cardiac medications before your next appointment, please call your pharmacy.

## 2017-01-23 DIAGNOSIS — D631 Anemia in chronic kidney disease: Secondary | ICD-10-CM | POA: Diagnosis not present

## 2017-01-23 DIAGNOSIS — D500 Iron deficiency anemia secondary to blood loss (chronic): Secondary | ICD-10-CM | POA: Diagnosis not present

## 2017-01-23 DIAGNOSIS — R0789 Other chest pain: Secondary | ICD-10-CM | POA: Diagnosis not present

## 2017-01-23 DIAGNOSIS — I440 Atrioventricular block, first degree: Secondary | ICD-10-CM | POA: Diagnosis not present

## 2017-01-23 DIAGNOSIS — J9811 Atelectasis: Secondary | ICD-10-CM | POA: Diagnosis not present

## 2017-01-23 DIAGNOSIS — N2581 Secondary hyperparathyroidism of renal origin: Secondary | ICD-10-CM | POA: Diagnosis not present

## 2017-01-23 DIAGNOSIS — D689 Coagulation defect, unspecified: Secondary | ICD-10-CM | POA: Diagnosis not present

## 2017-01-23 DIAGNOSIS — I132 Hypertensive heart and chronic kidney disease with heart failure and with stage 5 chronic kidney disease, or end stage renal disease: Secondary | ICD-10-CM | POA: Diagnosis not present

## 2017-01-23 DIAGNOSIS — E785 Hyperlipidemia, unspecified: Secondary | ICD-10-CM | POA: Diagnosis not present

## 2017-01-23 DIAGNOSIS — I5032 Chronic diastolic (congestive) heart failure: Secondary | ICD-10-CM | POA: Diagnosis not present

## 2017-01-23 DIAGNOSIS — Z4901 Encounter for fitting and adjustment of extracorporeal dialysis catheter: Secondary | ICD-10-CM | POA: Diagnosis not present

## 2017-01-23 DIAGNOSIS — I739 Peripheral vascular disease, unspecified: Secondary | ICD-10-CM | POA: Diagnosis not present

## 2017-01-23 DIAGNOSIS — Z992 Dependence on renal dialysis: Secondary | ICD-10-CM | POA: Diagnosis not present

## 2017-01-23 DIAGNOSIS — R079 Chest pain, unspecified: Secondary | ICD-10-CM | POA: Diagnosis not present

## 2017-01-23 DIAGNOSIS — I480 Paroxysmal atrial fibrillation: Secondary | ICD-10-CM | POA: Diagnosis not present

## 2017-01-23 DIAGNOSIS — I2510 Atherosclerotic heart disease of native coronary artery without angina pectoris: Secondary | ICD-10-CM | POA: Diagnosis not present

## 2017-01-23 DIAGNOSIS — I10 Essential (primary) hypertension: Secondary | ICD-10-CM | POA: Diagnosis not present

## 2017-01-23 DIAGNOSIS — N189 Chronic kidney disease, unspecified: Secondary | ICD-10-CM | POA: Diagnosis not present

## 2017-01-23 DIAGNOSIS — N186 End stage renal disease: Secondary | ICD-10-CM | POA: Diagnosis not present

## 2017-01-23 DIAGNOSIS — Z87891 Personal history of nicotine dependence: Secondary | ICD-10-CM | POA: Diagnosis not present

## 2017-01-24 DIAGNOSIS — I480 Paroxysmal atrial fibrillation: Secondary | ICD-10-CM | POA: Diagnosis not present

## 2017-01-24 DIAGNOSIS — N186 End stage renal disease: Secondary | ICD-10-CM | POA: Diagnosis not present

## 2017-01-24 DIAGNOSIS — I2510 Atherosclerotic heart disease of native coronary artery without angina pectoris: Secondary | ICD-10-CM | POA: Diagnosis not present

## 2017-01-24 DIAGNOSIS — I10 Essential (primary) hypertension: Secondary | ICD-10-CM | POA: Diagnosis not present

## 2017-01-24 DIAGNOSIS — I739 Peripheral vascular disease, unspecified: Secondary | ICD-10-CM | POA: Diagnosis not present

## 2017-01-25 DIAGNOSIS — N186 End stage renal disease: Secondary | ICD-10-CM | POA: Diagnosis not present

## 2017-01-25 DIAGNOSIS — D689 Coagulation defect, unspecified: Secondary | ICD-10-CM | POA: Diagnosis not present

## 2017-01-25 DIAGNOSIS — N2581 Secondary hyperparathyroidism of renal origin: Secondary | ICD-10-CM | POA: Diagnosis not present

## 2017-01-25 DIAGNOSIS — Z4901 Encounter for fitting and adjustment of extracorporeal dialysis catheter: Secondary | ICD-10-CM | POA: Diagnosis not present

## 2017-01-25 DIAGNOSIS — R079 Chest pain, unspecified: Secondary | ICD-10-CM | POA: Diagnosis not present

## 2017-01-25 DIAGNOSIS — D631 Anemia in chronic kidney disease: Secondary | ICD-10-CM | POA: Diagnosis not present

## 2017-01-28 DIAGNOSIS — Z4901 Encounter for fitting and adjustment of extracorporeal dialysis catheter: Secondary | ICD-10-CM | POA: Diagnosis not present

## 2017-01-28 DIAGNOSIS — D631 Anemia in chronic kidney disease: Secondary | ICD-10-CM | POA: Diagnosis not present

## 2017-01-28 DIAGNOSIS — R079 Chest pain, unspecified: Secondary | ICD-10-CM | POA: Diagnosis not present

## 2017-01-28 DIAGNOSIS — N186 End stage renal disease: Secondary | ICD-10-CM | POA: Diagnosis not present

## 2017-01-28 DIAGNOSIS — N2581 Secondary hyperparathyroidism of renal origin: Secondary | ICD-10-CM | POA: Diagnosis not present

## 2017-01-28 DIAGNOSIS — D689 Coagulation defect, unspecified: Secondary | ICD-10-CM | POA: Diagnosis not present

## 2017-01-29 DIAGNOSIS — N401 Enlarged prostate with lower urinary tract symptoms: Secondary | ICD-10-CM | POA: Diagnosis not present

## 2017-01-29 DIAGNOSIS — C61 Malignant neoplasm of prostate: Secondary | ICD-10-CM | POA: Diagnosis not present

## 2017-01-30 DIAGNOSIS — R079 Chest pain, unspecified: Secondary | ICD-10-CM | POA: Diagnosis not present

## 2017-01-30 DIAGNOSIS — N186 End stage renal disease: Secondary | ICD-10-CM | POA: Diagnosis not present

## 2017-01-30 DIAGNOSIS — N2581 Secondary hyperparathyroidism of renal origin: Secondary | ICD-10-CM | POA: Diagnosis not present

## 2017-01-30 DIAGNOSIS — D631 Anemia in chronic kidney disease: Secondary | ICD-10-CM | POA: Diagnosis not present

## 2017-01-30 DIAGNOSIS — D689 Coagulation defect, unspecified: Secondary | ICD-10-CM | POA: Diagnosis not present

## 2017-01-30 DIAGNOSIS — Z4901 Encounter for fitting and adjustment of extracorporeal dialysis catheter: Secondary | ICD-10-CM | POA: Diagnosis not present

## 2017-02-01 DIAGNOSIS — N186 End stage renal disease: Secondary | ICD-10-CM | POA: Diagnosis not present

## 2017-02-01 DIAGNOSIS — D689 Coagulation defect, unspecified: Secondary | ICD-10-CM | POA: Diagnosis not present

## 2017-02-01 DIAGNOSIS — N2581 Secondary hyperparathyroidism of renal origin: Secondary | ICD-10-CM | POA: Diagnosis not present

## 2017-02-01 DIAGNOSIS — D631 Anemia in chronic kidney disease: Secondary | ICD-10-CM | POA: Diagnosis not present

## 2017-02-03 DIAGNOSIS — D689 Coagulation defect, unspecified: Secondary | ICD-10-CM | POA: Diagnosis not present

## 2017-02-03 DIAGNOSIS — N186 End stage renal disease: Secondary | ICD-10-CM | POA: Diagnosis not present

## 2017-02-03 DIAGNOSIS — N2581 Secondary hyperparathyroidism of renal origin: Secondary | ICD-10-CM | POA: Diagnosis not present

## 2017-02-03 DIAGNOSIS — D631 Anemia in chronic kidney disease: Secondary | ICD-10-CM | POA: Diagnosis not present

## 2017-02-05 DIAGNOSIS — D631 Anemia in chronic kidney disease: Secondary | ICD-10-CM | POA: Diagnosis not present

## 2017-02-05 DIAGNOSIS — D689 Coagulation defect, unspecified: Secondary | ICD-10-CM | POA: Diagnosis not present

## 2017-02-05 DIAGNOSIS — N186 End stage renal disease: Secondary | ICD-10-CM | POA: Diagnosis not present

## 2017-02-05 DIAGNOSIS — N2581 Secondary hyperparathyroidism of renal origin: Secondary | ICD-10-CM | POA: Diagnosis not present

## 2017-02-08 DIAGNOSIS — D689 Coagulation defect, unspecified: Secondary | ICD-10-CM | POA: Diagnosis not present

## 2017-02-08 DIAGNOSIS — N186 End stage renal disease: Secondary | ICD-10-CM | POA: Diagnosis not present

## 2017-02-08 DIAGNOSIS — D631 Anemia in chronic kidney disease: Secondary | ICD-10-CM | POA: Diagnosis not present

## 2017-02-08 DIAGNOSIS — N2581 Secondary hyperparathyroidism of renal origin: Secondary | ICD-10-CM | POA: Diagnosis not present

## 2017-02-11 DIAGNOSIS — N2581 Secondary hyperparathyroidism of renal origin: Secondary | ICD-10-CM | POA: Diagnosis not present

## 2017-02-11 DIAGNOSIS — D631 Anemia in chronic kidney disease: Secondary | ICD-10-CM | POA: Diagnosis not present

## 2017-02-11 DIAGNOSIS — N186 End stage renal disease: Secondary | ICD-10-CM | POA: Diagnosis not present

## 2017-02-11 DIAGNOSIS — D689 Coagulation defect, unspecified: Secondary | ICD-10-CM | POA: Diagnosis not present

## 2017-02-13 DIAGNOSIS — M25522 Pain in left elbow: Secondary | ICD-10-CM | POA: Diagnosis not present

## 2017-02-13 DIAGNOSIS — M25512 Pain in left shoulder: Secondary | ICD-10-CM | POA: Diagnosis not present

## 2017-02-13 DIAGNOSIS — S40012A Contusion of left shoulder, initial encounter: Secondary | ICD-10-CM | POA: Diagnosis not present

## 2017-02-13 DIAGNOSIS — I6529 Occlusion and stenosis of unspecified carotid artery: Secondary | ICD-10-CM | POA: Diagnosis not present

## 2017-02-13 DIAGNOSIS — Z992 Dependence on renal dialysis: Secondary | ICD-10-CM | POA: Diagnosis not present

## 2017-02-13 DIAGNOSIS — Z23 Encounter for immunization: Secondary | ICD-10-CM | POA: Diagnosis not present

## 2017-02-13 DIAGNOSIS — M5030 Other cervical disc degeneration, unspecified cervical region: Secondary | ICD-10-CM | POA: Diagnosis not present

## 2017-02-13 DIAGNOSIS — D649 Anemia, unspecified: Secondary | ICD-10-CM | POA: Diagnosis not present

## 2017-02-13 DIAGNOSIS — S0003XA Contusion of scalp, initial encounter: Secondary | ICD-10-CM | POA: Diagnosis not present

## 2017-02-13 DIAGNOSIS — I480 Paroxysmal atrial fibrillation: Secondary | ICD-10-CM | POA: Diagnosis not present

## 2017-02-13 DIAGNOSIS — M19012 Primary osteoarthritis, left shoulder: Secondary | ICD-10-CM | POA: Diagnosis not present

## 2017-02-13 DIAGNOSIS — S39012A Strain of muscle, fascia and tendon of lower back, initial encounter: Secondary | ICD-10-CM | POA: Diagnosis not present

## 2017-02-13 DIAGNOSIS — I132 Hypertensive heart and chronic kidney disease with heart failure and with stage 5 chronic kidney disease, or end stage renal disease: Secondary | ICD-10-CM | POA: Diagnosis not present

## 2017-02-13 DIAGNOSIS — N186 End stage renal disease: Secondary | ICD-10-CM | POA: Diagnosis not present

## 2017-02-13 DIAGNOSIS — I77810 Thoracic aortic ectasia: Secondary | ICD-10-CM | POA: Diagnosis not present

## 2017-02-13 DIAGNOSIS — S0990XA Unspecified injury of head, initial encounter: Secondary | ICD-10-CM | POA: Diagnosis not present

## 2017-02-13 DIAGNOSIS — D689 Coagulation defect, unspecified: Secondary | ICD-10-CM | POA: Diagnosis not present

## 2017-02-13 DIAGNOSIS — I5032 Chronic diastolic (congestive) heart failure: Secondary | ICD-10-CM | POA: Diagnosis not present

## 2017-02-13 DIAGNOSIS — N2581 Secondary hyperparathyroidism of renal origin: Secondary | ICD-10-CM | POA: Diagnosis not present

## 2017-02-13 DIAGNOSIS — W19XXXA Unspecified fall, initial encounter: Secondary | ICD-10-CM | POA: Diagnosis not present

## 2017-02-13 DIAGNOSIS — R931 Abnormal findings on diagnostic imaging of heart and coronary circulation: Secondary | ICD-10-CM | POA: Diagnosis not present

## 2017-02-13 DIAGNOSIS — Z87891 Personal history of nicotine dependence: Secondary | ICD-10-CM | POA: Diagnosis not present

## 2017-02-13 DIAGNOSIS — M47816 Spondylosis without myelopathy or radiculopathy, lumbar region: Secondary | ICD-10-CM | POA: Diagnosis not present

## 2017-02-13 DIAGNOSIS — I2510 Atherosclerotic heart disease of native coronary artery without angina pectoris: Secondary | ICD-10-CM | POA: Diagnosis not present

## 2017-02-13 DIAGNOSIS — I771 Stricture of artery: Secondary | ICD-10-CM | POA: Diagnosis not present

## 2017-02-13 DIAGNOSIS — I739 Peripheral vascular disease, unspecified: Secondary | ICD-10-CM | POA: Diagnosis not present

## 2017-02-13 DIAGNOSIS — M47812 Spondylosis without myelopathy or radiculopathy, cervical region: Secondary | ICD-10-CM | POA: Diagnosis not present

## 2017-02-13 DIAGNOSIS — I340 Nonrheumatic mitral (valve) insufficiency: Secondary | ICD-10-CM | POA: Diagnosis not present

## 2017-02-13 DIAGNOSIS — S0093XA Contusion of unspecified part of head, initial encounter: Secondary | ICD-10-CM | POA: Diagnosis not present

## 2017-02-13 DIAGNOSIS — S3992XA Unspecified injury of lower back, initial encounter: Secondary | ICD-10-CM | POA: Diagnosis not present

## 2017-02-13 DIAGNOSIS — R079 Chest pain, unspecified: Secondary | ICD-10-CM | POA: Diagnosis not present

## 2017-02-13 DIAGNOSIS — D631 Anemia in chronic kidney disease: Secondary | ICD-10-CM | POA: Diagnosis not present

## 2017-02-13 DIAGNOSIS — M5136 Other intervertebral disc degeneration, lumbar region: Secondary | ICD-10-CM | POA: Diagnosis not present

## 2017-02-13 DIAGNOSIS — R2689 Other abnormalities of gait and mobility: Secondary | ICD-10-CM | POA: Diagnosis not present

## 2017-02-13 DIAGNOSIS — R748 Abnormal levels of other serum enzymes: Secondary | ICD-10-CM | POA: Diagnosis not present

## 2017-02-13 DIAGNOSIS — S4992XA Unspecified injury of left shoulder and upper arm, initial encounter: Secondary | ICD-10-CM | POA: Diagnosis not present

## 2017-02-13 DIAGNOSIS — K922 Gastrointestinal hemorrhage, unspecified: Secondary | ICD-10-CM | POA: Diagnosis not present

## 2017-02-14 DIAGNOSIS — Z992 Dependence on renal dialysis: Secondary | ICD-10-CM | POA: Diagnosis not present

## 2017-02-14 DIAGNOSIS — W19XXXA Unspecified fall, initial encounter: Secondary | ICD-10-CM | POA: Diagnosis not present

## 2017-02-14 DIAGNOSIS — I25119 Atherosclerotic heart disease of native coronary artery with unspecified angina pectoris: Secondary | ICD-10-CM | POA: Diagnosis not present

## 2017-02-14 DIAGNOSIS — I10 Essential (primary) hypertension: Secondary | ICD-10-CM | POA: Diagnosis not present

## 2017-02-14 DIAGNOSIS — I739 Peripheral vascular disease, unspecified: Secondary | ICD-10-CM | POA: Diagnosis not present

## 2017-02-14 DIAGNOSIS — N186 End stage renal disease: Secondary | ICD-10-CM | POA: Diagnosis not present

## 2017-02-14 DIAGNOSIS — M25522 Pain in left elbow: Secondary | ICD-10-CM | POA: Diagnosis not present

## 2017-02-14 DIAGNOSIS — I340 Nonrheumatic mitral (valve) insufficiency: Secondary | ICD-10-CM | POA: Diagnosis not present

## 2017-02-14 DIAGNOSIS — R079 Chest pain, unspecified: Secondary | ICD-10-CM | POA: Diagnosis not present

## 2017-02-14 DIAGNOSIS — R748 Abnormal levels of other serum enzymes: Secondary | ICD-10-CM | POA: Diagnosis not present

## 2017-02-15 DIAGNOSIS — R748 Abnormal levels of other serum enzymes: Secondary | ICD-10-CM | POA: Diagnosis not present

## 2017-02-15 DIAGNOSIS — R079 Chest pain, unspecified: Secondary | ICD-10-CM | POA: Diagnosis not present

## 2017-02-15 DIAGNOSIS — W101XXA Fall (on)(from) sidewalk curb, initial encounter: Secondary | ICD-10-CM | POA: Diagnosis not present

## 2017-02-15 DIAGNOSIS — I120 Hypertensive chronic kidney disease with stage 5 chronic kidney disease or end stage renal disease: Secondary | ICD-10-CM | POA: Diagnosis not present

## 2017-02-15 DIAGNOSIS — I739 Peripheral vascular disease, unspecified: Secondary | ICD-10-CM | POA: Diagnosis not present

## 2017-02-15 DIAGNOSIS — Z992 Dependence on renal dialysis: Secondary | ICD-10-CM | POA: Diagnosis not present

## 2017-02-15 DIAGNOSIS — N186 End stage renal disease: Secondary | ICD-10-CM | POA: Diagnosis not present

## 2017-02-18 DIAGNOSIS — D509 Iron deficiency anemia, unspecified: Secondary | ICD-10-CM | POA: Diagnosis not present

## 2017-02-18 DIAGNOSIS — D689 Coagulation defect, unspecified: Secondary | ICD-10-CM | POA: Diagnosis not present

## 2017-02-18 DIAGNOSIS — N2581 Secondary hyperparathyroidism of renal origin: Secondary | ICD-10-CM | POA: Diagnosis not present

## 2017-02-18 DIAGNOSIS — N186 End stage renal disease: Secondary | ICD-10-CM | POA: Diagnosis not present

## 2017-02-20 DIAGNOSIS — D509 Iron deficiency anemia, unspecified: Secondary | ICD-10-CM | POA: Diagnosis not present

## 2017-02-20 DIAGNOSIS — D689 Coagulation defect, unspecified: Secondary | ICD-10-CM | POA: Diagnosis not present

## 2017-02-20 DIAGNOSIS — N2581 Secondary hyperparathyroidism of renal origin: Secondary | ICD-10-CM | POA: Diagnosis not present

## 2017-02-20 DIAGNOSIS — N186 End stage renal disease: Secondary | ICD-10-CM | POA: Diagnosis not present

## 2017-02-22 DIAGNOSIS — N2581 Secondary hyperparathyroidism of renal origin: Secondary | ICD-10-CM | POA: Diagnosis not present

## 2017-02-22 DIAGNOSIS — D509 Iron deficiency anemia, unspecified: Secondary | ICD-10-CM | POA: Diagnosis not present

## 2017-02-22 DIAGNOSIS — D689 Coagulation defect, unspecified: Secondary | ICD-10-CM | POA: Diagnosis not present

## 2017-02-22 DIAGNOSIS — N186 End stage renal disease: Secondary | ICD-10-CM | POA: Diagnosis not present

## 2017-02-25 DIAGNOSIS — N186 End stage renal disease: Secondary | ICD-10-CM | POA: Diagnosis not present

## 2017-02-25 DIAGNOSIS — D509 Iron deficiency anemia, unspecified: Secondary | ICD-10-CM | POA: Diagnosis not present

## 2017-02-25 DIAGNOSIS — N2581 Secondary hyperparathyroidism of renal origin: Secondary | ICD-10-CM | POA: Diagnosis not present

## 2017-02-25 DIAGNOSIS — D689 Coagulation defect, unspecified: Secondary | ICD-10-CM | POA: Diagnosis not present

## 2017-02-26 ENCOUNTER — Encounter: Payer: Self-pay | Admitting: Vascular Surgery

## 2017-02-26 ENCOUNTER — Encounter: Payer: Self-pay | Admitting: *Deleted

## 2017-02-26 ENCOUNTER — Ambulatory Visit: Payer: Medicare Other | Admitting: Vascular Surgery

## 2017-02-26 VITALS — BP 120/59 | HR 61 | Temp 98.0°F | Resp 20 | Ht 66.0 in | Wt 177.6 lb

## 2017-02-26 DIAGNOSIS — N186 End stage renal disease: Secondary | ICD-10-CM

## 2017-02-26 DIAGNOSIS — Z992 Dependence on renal dialysis: Secondary | ICD-10-CM

## 2017-02-26 NOTE — H&P (View-Only) (Signed)
Patient name: Roger [name] MRN: [id number] DOB: 03/02/1941 Sex: male   REASON FOR CONSULT:    To evaluate for AV fistula.  The consult is requested by Dr. [name].  HPI:   Roger [name] is a pleasant 76 y.o. male, with a history of end-stage renal disease secondary to hypertension.  He is on dialysis. The patient had a tunneled dialysis catheter placed on 10/22/2016.  He dialyzes on Tuesdays Thursdays and Saturdays.  He is right-handed.  He does not have a pacemaker.  He denies any uremic symptoms.  Specifically, he denies nausea, vomiting, fatigue, anorexia, or palpitations.  Past Medical History:  Diagnosis Date  . Anemia   . Anginal pain ([place])   . Arthritis    "all over; bad in the legs" (12/04/2016)  . CAD (coronary artery disease)    a. NSTEMI in setting of anemia; b. 10/2016 MV: inflat ST dep with large, reversible ant, apical, inflat defect; c. 10/2016 Cath: LM nl, LAD 40p, 3m, D1 70, D2 80, RI min irregs, LCX 100ost, RCA 100p, 182m/d, RPDA fills via collats from LAD, RPAV small-->Initial conservative Rx in setting of GIB w/ plan for PCI LAD if H/H stable on ASA/Plavix;  d. 12/04/16 s/p PTCA and DES to mLAD.  Marland Kitchen Diastolic dysfunction    a. 10/2016 Echo: EF 55-60%, no rwma, Gr2 DD, triv MR, mildly dil LA.  Marland Kitchen ESRD (end stage renal disease) ([place])    a. 10/2016 HD catheter insterted and HD initiated; b. Pending permanent HD access folllowing Cardiac Revascularization.  . ESRD on dialysis [facility])    "[place]; [address]; TTS usually; going to [facility] in [place], [place] right now while staying w/daughter" (12/04/2016)  . GERD (gastroesophageal reflux disease)   . GIB (gastrointestinal bleeding) 10/2011   a. 10/2016 3u PRBCs - EGD/Colonoscopy: angiodysplasia w/ diverticulosis. No apparent bleeding. Polypecotmy->tubular adenomas.  . Gout   . Heart murmur   . High cholesterol   . History of blood transfusion 10/18/2016   "related to anemia" (12/04/2016)  . Hypertension   . Iron  deficiency anemia   . Myocardial infarction ([place]) 10/18/2016  . Old MI (myocardial infarction)    "found evidence of this on 10/18/2016"  . PAF (paroxysmal atrial fibrillation) ([place])   . Peripheral vascular disease ([place])    BLE  . Prostate cancer ([place])    a. s/p seed implantation.  . Tobacco abuse     Family History  Problem Relation Age of Onset  . Cancer Mother   . Cancer Father   . Heart attack Father   . Diabetes Father   . Hypertension Father   . Cancer Sister   . Cancer Brother   . Diabetes Brother   . Hypertension Brother   . Hypertension Daughter     SOCIAL HISTORY: Social History   Socioeconomic History  . Marital status: Widowed    Spouse name: Not on file  . Number of children: Not on file  . Years of education: Not on file  . Highest education level: Not on file  Social Needs  . Financial resource strain: Not on file  . Food insecurity - worry: Not on file  . Food insecurity - inability: Not on file  . Transportation needs - medical: Not on file  . Transportation needs - non-medical: Not on file  Occupational History  . Not on file  Tobacco Use  . Smoking status: Former Smoker    Packs/day: 0.50    Years:  50.00    Pack years: 25.00    Types: Cigarettes    Last attempt to quit: 10/18/2016    Years since quitting: 0.3  . Smokeless tobacco: Never Used  Substance and Sexual Activity  . Alcohol use: No  . Drug use: No  . Sexual activity: No    Birth control/protection: Abstinence  Other Topics Concern  . Not on file  Social History Narrative  . Not on file    No Known Allergies  Current Outpatient Medications  Medication Sig Dispense Refill  . allopurinol (ZYLOPRIM) 100 MG tablet Take 1 tablet (100 mg total) by mouth daily. 30 tablet 0  . amLODipine (NORVASC) 10 MG tablet Take 1 tablet (10 mg total) by mouth daily. 90 tablet 3  . atorvastatin (LIPITOR) 80 MG tablet Take 1 tablet (80 mg total) by mouth daily at 6 PM. 90 tablet 3  . b complex  vitamins tablet Take 1 tablet by mouth at bedtime. (2300)    . calcitRIOL (ROCALTROL) 0.25 MCG capsule Take 1 capsule (0.25 mcg total) by mouth Every Tuesday,Thursday,and Saturday with dialysis. 30 capsule 0  . carvedilol (COREG) 3.125 MG tablet Take 1 tablet (3.125 mg total) by mouth 2 (two) times daily with a meal. 180 tablet 3  . clopidogrel (PLAVIX) 75 MG tablet Take 1 tablet (75 mg total) by mouth daily. 90 tablet 3  . hydrALAZINE (APRESOLINE) 50 MG tablet Take 1 tablet (50 mg total) by mouth every 8 (eight) hours. (Patient taking differently: Take 25 mg by mouth every 8 (eight) hours. (0800, 1700, 2300)) 270 tablet 3  . isosorbide dinitrate (ISORDIL) 30 MG tablet Take 30 mg by mouth daily.    . ranitidine (ZANTAC) 150 MG tablet Take 150 mg by mouth daily before breakfast.      No current facility-administered medications for this visit.     REVIEW OF SYSTEMS:  [X]  denotes positive finding, [ ]  denotes negative finding Cardiac  Comments:  Chest pain or chest pressure: X   Shortness of breath upon exertion:    Short of breath when lying flat:    Irregular heart rhythm:        Vascular    Pain in calf, thigh, or hip brought on by ambulation: X   Pain in feet at night that wakes you up from your sleep:     Blood clot in your veins:    Leg swelling:         Pulmonary    Oxygen at home:    Productive cough:     Wheezing:         Neurologic    Sudden weakness in arms or legs:     Sudden numbness in arms or legs:     Sudden onset of difficulty speaking or slurred speech:    Temporary loss of vision in one eye:     Problems with dizziness:         Gastrointestinal    Blood in stool:     Vomited blood:         Genitourinary    Burning when urinating:     Blood in urine:        Psychiatric    Major depression:         Hematologic    Bleeding problems:    Problems with blood clotting too easily:        Skin    Rashes or ulcers:        Constitutional  Fever or  chills:     PHYSICAL EXAM:   Vitals:   02/26/17 1310  BP: (!) 120/59  Pulse: 61  Resp: 20  Temp: 98 F (36.7 C)  TempSrc: Oral  SpO2: 97%  Weight: 177 lb 9.6 oz (80.6 kg)  Height: 5\' 6"  (1.676 m)    GENERAL: The patient is a well-nourished male, in no acute distress. The vital signs are documented above. CARDIAC: There is a regular rate and rhythm.  VASCULAR: I do not detect carotid bruits. He has a palpable brachial and radial pulse bilaterally. He has no significant lower extremity swelling. PULMONARY: There is good air exchange bilaterally without wheezing or rales. ABDOMEN: Soft and non-tender with normal pitched bowel sounds.  MUSCULOSKELETAL: There are no major deformities or cyanosis. NEUROLOGIC: No focal weakness or paresthesias are detected. SKIN: There are no ulcers or rashes noted. PSYCHIATRIC: The patient has a normal affect.  DATA:    UPPER EXTREMITY VEIN MAP: I have reviewed the vein map that was done in August.  Based on his vein map, it appears that his best vein for a fistula would be the upper arm cephalic vein on the right.  MEDICAL ISSUES:   END-STAGE RENAL DISEASE: The patient appears to be a good candidate for a right brachiocephalic AV fistula. I have explained the indications for placement of an AV fistula or AV graft. I've explained that if at all possible we will place an AV fistula.  I have reviewed the risks of placement of an AV fistula including but not limited to: failure of the fistula to mature, need for subsequent interventions, and thrombosis. In addition I have reviewed the potential complications of placement of an AV graft. These risks include, but are not limited to, graft thrombosis, graft infection, wound healing problems, bleeding, arm swelling, and steal syndrome. All the patient's questions were answered and they are agreeable to proceed with surgery.  His surgery is scheduled for 03/28/2017.   [name] [facility] [phone]

## 2017-02-26 NOTE — Progress Notes (Signed)
Patient name: Roger [name] MRN: [id number] DOB: 23-May-1940 Sex: male   REASON FOR CONSULT:    To evaluate for AV fistula.  The consult is requested by Dr. [name].  HPI:   Roger [name] is a pleasant 76 y.o. male, with a history of end-stage renal disease secondary to hypertension.  He is on dialysis. The patient had a tunneled dialysis catheter placed on 10/22/2016.  He dialyzes on Tuesdays Thursdays and Saturdays.  He is right-handed.  He does not have a pacemaker.  He denies any uremic symptoms.  Specifically, he denies nausea, vomiting, fatigue, anorexia, or palpitations.  Past Medical History:  Diagnosis Date  . Anemia   . Anginal pain ([place])   . Arthritis    "all over; bad in the legs" (12/04/2016)  . CAD (coronary artery disease)    a. NSTEMI in setting of anemia; b. 10/2016 MV: inflat ST dep with large, reversible ant, apical, inflat defect; c. 10/2016 Cath: LM nl, LAD 40p, 79m, D1 70, D2 80, RI min irregs, LCX 100ost, RCA 100p, 193m/d, RPDA fills via collats from LAD, RPAV small-->Initial conservative Rx in setting of GIB w/ plan for PCI LAD if H/H stable on ASA/Plavix;  d. 12/04/16 s/p PTCA and DES to mLAD.  Marland Kitchen Diastolic dysfunction    a. 10/2016 Echo: EF 55-60%, no rwma, Gr2 DD, triv MR, mildly dil LA.  Marland Kitchen ESRD (end stage renal disease) ([place])    a. 10/2016 HD catheter insterted and HD initiated; b. Pending permanent HD access folllowing Cardiac Revascularization.  . ESRD on dialysis [facility])    "[place]; [address]; TTS usually; going to [facility] in [place], [place] right now while staying w/daughter" (12/04/2016)  . GERD (gastroesophageal reflux disease)   . GIB (gastrointestinal bleeding) 10/2011   a. 10/2016 3u PRBCs - EGD/Colonoscopy: angiodysplasia w/ diverticulosis. No apparent bleeding. Polypecotmy->tubular adenomas.  . Gout   . Heart murmur   . High cholesterol   . History of blood transfusion 10/18/2016   "related to anemia" (12/04/2016)  . Hypertension   . Iron  deficiency anemia   . Myocardial infarction ([place]) 10/18/2016  . Old MI (myocardial infarction)    "found evidence of this on 10/18/2016"  . PAF (paroxysmal atrial fibrillation) ([place])   . Peripheral vascular disease ([place])    BLE  . Prostate cancer ([place])    a. s/p seed implantation.  . Tobacco abuse     Family History  Problem Relation Age of Onset  . Cancer Mother   . Cancer Father   . Heart attack Father   . Diabetes Father   . Hypertension Father   . Cancer Sister   . Cancer Brother   . Diabetes Brother   . Hypertension Brother   . Hypertension Daughter     SOCIAL HISTORY: Social History   Socioeconomic History  . Marital status: Widowed    Spouse name: Not on file  . Number of children: Not on file  . Years of education: Not on file  . Highest education level: Not on file  Social Needs  . Financial resource strain: Not on file  . Food insecurity - worry: Not on file  . Food insecurity - inability: Not on file  . Transportation needs - medical: Not on file  . Transportation needs - non-medical: Not on file  Occupational History  . Not on file  Tobacco Use  . Smoking status: Former Smoker    Packs/day: 0.50    Years:  50.00    Pack years: 25.00    Types: Cigarettes    Last attempt to quit: 10/18/2016    Years since quitting: 0.3  . Smokeless tobacco: Never Used  Substance and Sexual Activity  . Alcohol use: No  . Drug use: No  . Sexual activity: No    Birth control/protection: Abstinence  Other Topics Concern  . Not on file  Social History Narrative  . Not on file    No Known Allergies  Current Outpatient Medications  Medication Sig Dispense Refill  . allopurinol (ZYLOPRIM) 100 MG tablet Take 1 tablet (100 mg total) by mouth daily. 30 tablet 0  . amLODipine (NORVASC) 10 MG tablet Take 1 tablet (10 mg total) by mouth daily. 90 tablet 3  . atorvastatin (LIPITOR) 80 MG tablet Take 1 tablet (80 mg total) by mouth daily at 6 PM. 90 tablet 3  . b complex  vitamins tablet Take 1 tablet by mouth at bedtime. (2300)    . calcitRIOL (ROCALTROL) 0.25 MCG capsule Take 1 capsule (0.25 mcg total) by mouth Every Tuesday,Thursday,and Saturday with dialysis. 30 capsule 0  . carvedilol (COREG) 3.125 MG tablet Take 1 tablet (3.125 mg total) by mouth 2 (two) times daily with a meal. 180 tablet 3  . clopidogrel (PLAVIX) 75 MG tablet Take 1 tablet (75 mg total) by mouth daily. 90 tablet 3  . hydrALAZINE (APRESOLINE) 50 MG tablet Take 1 tablet (50 mg total) by mouth every 8 (eight) hours. (Patient taking differently: Take 25 mg by mouth every 8 (eight) hours. (0800, 1700, 2300)) 270 tablet 3  . isosorbide dinitrate (ISORDIL) 30 MG tablet Take 30 mg by mouth daily.    . ranitidine (ZANTAC) 150 MG tablet Take 150 mg by mouth daily before breakfast.      No current facility-administered medications for this visit.     REVIEW OF SYSTEMS:  [X]  denotes positive finding, [ ]  denotes negative finding Cardiac  Comments:  Chest pain or chest pressure: X   Shortness of breath upon exertion:    Short of breath when lying flat:    Irregular heart rhythm:        Vascular    Pain in calf, thigh, or hip brought on by ambulation: X   Pain in feet at night that wakes you up from your sleep:     Blood clot in your veins:    Leg swelling:         Pulmonary    Oxygen at home:    Productive cough:     Wheezing:         Neurologic    Sudden weakness in arms or legs:     Sudden numbness in arms or legs:     Sudden onset of difficulty speaking or slurred speech:    Temporary loss of vision in one eye:     Problems with dizziness:         Gastrointestinal    Blood in stool:     Vomited blood:         Genitourinary    Burning when urinating:     Blood in urine:        Psychiatric    Major depression:         Hematologic    Bleeding problems:    Problems with blood clotting too easily:        Skin    Rashes or ulcers:        Constitutional  Fever or  chills:     PHYSICAL EXAM:   Vitals:   02/26/17 1310  BP: (!) 120/59  Pulse: 61  Resp: 20  Temp: 98 F (36.7 C)  TempSrc: Oral  SpO2: 97%  Weight: 177 lb 9.6 oz (80.6 kg)  Height: 5\' 6"  (1.676 m)    GENERAL: The patient is a well-nourished male, in no acute distress. The vital signs are documented above. CARDIAC: There is a regular rate and rhythm.  VASCULAR: I do not detect carotid bruits. He has a palpable brachial and radial pulse bilaterally. He has no significant lower extremity swelling. PULMONARY: There is good air exchange bilaterally without wheezing or rales. ABDOMEN: Soft and non-tender with normal pitched bowel sounds.  MUSCULOSKELETAL: There are no major deformities or cyanosis. NEUROLOGIC: No focal weakness or paresthesias are detected. SKIN: There are no ulcers or rashes noted. PSYCHIATRIC: The patient has a normal affect.  DATA:    UPPER EXTREMITY VEIN MAP: I have reviewed the vein map that was done in August.  Based on his vein map, it appears that his best vein for a fistula would be the upper arm cephalic vein on the right.  MEDICAL ISSUES:   END-STAGE RENAL DISEASE: The patient appears to be a good candidate for a right brachiocephalic AV fistula. I have explained the indications for placement of an AV fistula or AV graft. I've explained that if at all possible we will place an AV fistula.  I have reviewed the risks of placement of an AV fistula including but not limited to: failure of the fistula to mature, need for subsequent interventions, and thrombosis. In addition I have reviewed the potential complications of placement of an AV graft. These risks include, but are not limited to, graft thrombosis, graft infection, wound healing problems, bleeding, arm swelling, and steal syndrome. All the patient's questions were answered and they are agreeable to proceed with surgery.  His surgery is scheduled for 03/28/2017.   [name] [facility]  [facility] [phone]

## 2017-02-27 DIAGNOSIS — D509 Iron deficiency anemia, unspecified: Secondary | ICD-10-CM | POA: Diagnosis not present

## 2017-02-27 DIAGNOSIS — N186 End stage renal disease: Secondary | ICD-10-CM | POA: Diagnosis not present

## 2017-02-27 DIAGNOSIS — N2581 Secondary hyperparathyroidism of renal origin: Secondary | ICD-10-CM | POA: Diagnosis not present

## 2017-02-27 DIAGNOSIS — D689 Coagulation defect, unspecified: Secondary | ICD-10-CM | POA: Diagnosis not present

## 2017-02-28 DIAGNOSIS — N186 End stage renal disease: Secondary | ICD-10-CM | POA: Diagnosis not present

## 2017-02-28 DIAGNOSIS — M160 Bilateral primary osteoarthritis of hip: Secondary | ICD-10-CM | POA: Diagnosis not present

## 2017-02-28 DIAGNOSIS — I120 Hypertensive chronic kidney disease with stage 5 chronic kidney disease or end stage renal disease: Secondary | ICD-10-CM | POA: Diagnosis not present

## 2017-02-28 DIAGNOSIS — I2510 Atherosclerotic heart disease of native coronary artery without angina pectoris: Secondary | ICD-10-CM | POA: Diagnosis not present

## 2017-02-28 DIAGNOSIS — M25522 Pain in left elbow: Secondary | ICD-10-CM | POA: Diagnosis not present

## 2017-03-01 DIAGNOSIS — D689 Coagulation defect, unspecified: Secondary | ICD-10-CM | POA: Diagnosis not present

## 2017-03-01 DIAGNOSIS — N2581 Secondary hyperparathyroidism of renal origin: Secondary | ICD-10-CM | POA: Diagnosis not present

## 2017-03-01 DIAGNOSIS — N186 End stage renal disease: Secondary | ICD-10-CM | POA: Diagnosis not present

## 2017-03-01 DIAGNOSIS — D509 Iron deficiency anemia, unspecified: Secondary | ICD-10-CM | POA: Diagnosis not present

## 2017-03-04 DIAGNOSIS — N2581 Secondary hyperparathyroidism of renal origin: Secondary | ICD-10-CM | POA: Diagnosis not present

## 2017-03-04 DIAGNOSIS — N186 End stage renal disease: Secondary | ICD-10-CM | POA: Diagnosis not present

## 2017-03-04 DIAGNOSIS — D509 Iron deficiency anemia, unspecified: Secondary | ICD-10-CM | POA: Diagnosis not present

## 2017-03-04 DIAGNOSIS — D689 Coagulation defect, unspecified: Secondary | ICD-10-CM | POA: Diagnosis not present

## 2017-03-05 DIAGNOSIS — I2510 Atherosclerotic heart disease of native coronary artery without angina pectoris: Secondary | ICD-10-CM | POA: Diagnosis not present

## 2017-03-05 DIAGNOSIS — N186 End stage renal disease: Secondary | ICD-10-CM | POA: Diagnosis not present

## 2017-03-05 DIAGNOSIS — I120 Hypertensive chronic kidney disease with stage 5 chronic kidney disease or end stage renal disease: Secondary | ICD-10-CM | POA: Diagnosis not present

## 2017-03-05 DIAGNOSIS — M25522 Pain in left elbow: Secondary | ICD-10-CM | POA: Diagnosis not present

## 2017-03-05 DIAGNOSIS — M160 Bilateral primary osteoarthritis of hip: Secondary | ICD-10-CM | POA: Diagnosis not present

## 2017-03-06 DIAGNOSIS — D509 Iron deficiency anemia, unspecified: Secondary | ICD-10-CM | POA: Diagnosis not present

## 2017-03-06 DIAGNOSIS — D689 Coagulation defect, unspecified: Secondary | ICD-10-CM | POA: Diagnosis not present

## 2017-03-06 DIAGNOSIS — N2581 Secondary hyperparathyroidism of renal origin: Secondary | ICD-10-CM | POA: Diagnosis not present

## 2017-03-06 DIAGNOSIS — N186 End stage renal disease: Secondary | ICD-10-CM | POA: Diagnosis not present

## 2017-03-07 ENCOUNTER — Telehealth: Payer: Self-pay | Admitting: Cardiovascular Disease

## 2017-03-07 DIAGNOSIS — M160 Bilateral primary osteoarthritis of hip: Secondary | ICD-10-CM | POA: Diagnosis not present

## 2017-03-07 DIAGNOSIS — M25522 Pain in left elbow: Secondary | ICD-10-CM | POA: Diagnosis not present

## 2017-03-07 DIAGNOSIS — I2510 Atherosclerotic heart disease of native coronary artery without angina pectoris: Secondary | ICD-10-CM | POA: Diagnosis not present

## 2017-03-07 DIAGNOSIS — I120 Hypertensive chronic kidney disease with stage 5 chronic kidney disease or end stage renal disease: Secondary | ICD-10-CM | POA: Diagnosis not present

## 2017-03-07 DIAGNOSIS — N186 End stage renal disease: Secondary | ICD-10-CM | POA: Diagnosis not present

## 2017-03-07 NOTE — Telephone Encounter (Signed)
lmov to r/s appt 2/8 Fletcher Anon (out of office)

## 2017-03-08 DIAGNOSIS — D689 Coagulation defect, unspecified: Secondary | ICD-10-CM | POA: Diagnosis not present

## 2017-03-08 DIAGNOSIS — N186 End stage renal disease: Secondary | ICD-10-CM | POA: Diagnosis not present

## 2017-03-08 DIAGNOSIS — D509 Iron deficiency anemia, unspecified: Secondary | ICD-10-CM | POA: Diagnosis not present

## 2017-03-08 DIAGNOSIS — N2581 Secondary hyperparathyroidism of renal origin: Secondary | ICD-10-CM | POA: Diagnosis not present

## 2017-03-10 DIAGNOSIS — N186 End stage renal disease: Secondary | ICD-10-CM | POA: Diagnosis not present

## 2017-03-10 DIAGNOSIS — D509 Iron deficiency anemia, unspecified: Secondary | ICD-10-CM | POA: Diagnosis not present

## 2017-03-10 DIAGNOSIS — N2581 Secondary hyperparathyroidism of renal origin: Secondary | ICD-10-CM | POA: Diagnosis not present

## 2017-03-10 DIAGNOSIS — D689 Coagulation defect, unspecified: Secondary | ICD-10-CM | POA: Diagnosis not present

## 2017-03-12 DIAGNOSIS — M160 Bilateral primary osteoarthritis of hip: Secondary | ICD-10-CM | POA: Diagnosis not present

## 2017-03-12 DIAGNOSIS — M25522 Pain in left elbow: Secondary | ICD-10-CM | POA: Diagnosis not present

## 2017-03-12 DIAGNOSIS — N186 End stage renal disease: Secondary | ICD-10-CM | POA: Diagnosis not present

## 2017-03-12 DIAGNOSIS — I120 Hypertensive chronic kidney disease with stage 5 chronic kidney disease or end stage renal disease: Secondary | ICD-10-CM | POA: Diagnosis not present

## 2017-03-12 DIAGNOSIS — I2510 Atherosclerotic heart disease of native coronary artery without angina pectoris: Secondary | ICD-10-CM | POA: Diagnosis not present

## 2017-03-13 ENCOUNTER — Other Ambulatory Visit: Payer: Self-pay | Admitting: *Deleted

## 2017-03-13 DIAGNOSIS — D509 Iron deficiency anemia, unspecified: Secondary | ICD-10-CM | POA: Diagnosis not present

## 2017-03-13 DIAGNOSIS — D689 Coagulation defect, unspecified: Secondary | ICD-10-CM | POA: Diagnosis not present

## 2017-03-13 DIAGNOSIS — N186 End stage renal disease: Secondary | ICD-10-CM | POA: Diagnosis not present

## 2017-03-13 DIAGNOSIS — N2581 Secondary hyperparathyroidism of renal origin: Secondary | ICD-10-CM | POA: Diagnosis not present

## 2017-03-14 DIAGNOSIS — M160 Bilateral primary osteoarthritis of hip: Secondary | ICD-10-CM | POA: Diagnosis not present

## 2017-03-14 DIAGNOSIS — I2510 Atherosclerotic heart disease of native coronary artery without angina pectoris: Secondary | ICD-10-CM | POA: Diagnosis not present

## 2017-03-14 DIAGNOSIS — N186 End stage renal disease: Secondary | ICD-10-CM | POA: Diagnosis not present

## 2017-03-14 DIAGNOSIS — M25522 Pain in left elbow: Secondary | ICD-10-CM | POA: Diagnosis not present

## 2017-03-14 DIAGNOSIS — I120 Hypertensive chronic kidney disease with stage 5 chronic kidney disease or end stage renal disease: Secondary | ICD-10-CM | POA: Diagnosis not present

## 2017-03-15 DIAGNOSIS — N2581 Secondary hyperparathyroidism of renal origin: Secondary | ICD-10-CM | POA: Diagnosis not present

## 2017-03-15 DIAGNOSIS — N186 End stage renal disease: Secondary | ICD-10-CM | POA: Diagnosis not present

## 2017-03-15 DIAGNOSIS — D689 Coagulation defect, unspecified: Secondary | ICD-10-CM | POA: Diagnosis not present

## 2017-03-15 DIAGNOSIS — D509 Iron deficiency anemia, unspecified: Secondary | ICD-10-CM | POA: Diagnosis not present

## 2017-03-17 DIAGNOSIS — D509 Iron deficiency anemia, unspecified: Secondary | ICD-10-CM | POA: Diagnosis not present

## 2017-03-17 DIAGNOSIS — D689 Coagulation defect, unspecified: Secondary | ICD-10-CM | POA: Diagnosis not present

## 2017-03-17 DIAGNOSIS — I129 Hypertensive chronic kidney disease with stage 1 through stage 4 chronic kidney disease, or unspecified chronic kidney disease: Secondary | ICD-10-CM | POA: Diagnosis not present

## 2017-03-17 DIAGNOSIS — N2581 Secondary hyperparathyroidism of renal origin: Secondary | ICD-10-CM | POA: Diagnosis not present

## 2017-03-17 DIAGNOSIS — N186 End stage renal disease: Secondary | ICD-10-CM | POA: Diagnosis not present

## 2017-03-17 DIAGNOSIS — Z992 Dependence on renal dialysis: Secondary | ICD-10-CM | POA: Diagnosis not present

## 2017-03-19 DIAGNOSIS — I120 Hypertensive chronic kidney disease with stage 5 chronic kidney disease or end stage renal disease: Secondary | ICD-10-CM | POA: Diagnosis not present

## 2017-03-19 DIAGNOSIS — M25522 Pain in left elbow: Secondary | ICD-10-CM | POA: Diagnosis not present

## 2017-03-19 DIAGNOSIS — N186 End stage renal disease: Secondary | ICD-10-CM | POA: Diagnosis not present

## 2017-03-19 DIAGNOSIS — I2510 Atherosclerotic heart disease of native coronary artery without angina pectoris: Secondary | ICD-10-CM | POA: Diagnosis not present

## 2017-03-19 DIAGNOSIS — M160 Bilateral primary osteoarthritis of hip: Secondary | ICD-10-CM | POA: Diagnosis not present

## 2017-03-20 DIAGNOSIS — N2581 Secondary hyperparathyroidism of renal origin: Secondary | ICD-10-CM | POA: Diagnosis not present

## 2017-03-20 DIAGNOSIS — D689 Coagulation defect, unspecified: Secondary | ICD-10-CM | POA: Diagnosis not present

## 2017-03-20 DIAGNOSIS — N186 End stage renal disease: Secondary | ICD-10-CM | POA: Diagnosis not present

## 2017-03-20 DIAGNOSIS — D631 Anemia in chronic kidney disease: Secondary | ICD-10-CM | POA: Diagnosis not present

## 2017-03-20 DIAGNOSIS — D509 Iron deficiency anemia, unspecified: Secondary | ICD-10-CM | POA: Diagnosis not present

## 2017-03-21 DIAGNOSIS — I120 Hypertensive chronic kidney disease with stage 5 chronic kidney disease or end stage renal disease: Secondary | ICD-10-CM | POA: Diagnosis not present

## 2017-03-21 DIAGNOSIS — M25522 Pain in left elbow: Secondary | ICD-10-CM | POA: Diagnosis not present

## 2017-03-21 DIAGNOSIS — I2510 Atherosclerotic heart disease of native coronary artery without angina pectoris: Secondary | ICD-10-CM | POA: Diagnosis not present

## 2017-03-21 DIAGNOSIS — N186 End stage renal disease: Secondary | ICD-10-CM | POA: Diagnosis not present

## 2017-03-21 DIAGNOSIS — M160 Bilateral primary osteoarthritis of hip: Secondary | ICD-10-CM | POA: Diagnosis not present

## 2017-03-22 DIAGNOSIS — N2581 Secondary hyperparathyroidism of renal origin: Secondary | ICD-10-CM | POA: Diagnosis not present

## 2017-03-22 DIAGNOSIS — D509 Iron deficiency anemia, unspecified: Secondary | ICD-10-CM | POA: Diagnosis not present

## 2017-03-22 DIAGNOSIS — D689 Coagulation defect, unspecified: Secondary | ICD-10-CM | POA: Diagnosis not present

## 2017-03-22 DIAGNOSIS — N186 End stage renal disease: Secondary | ICD-10-CM | POA: Diagnosis not present

## 2017-03-22 DIAGNOSIS — D631 Anemia in chronic kidney disease: Secondary | ICD-10-CM | POA: Diagnosis not present

## 2017-03-24 DIAGNOSIS — I2510 Atherosclerotic heart disease of native coronary artery without angina pectoris: Secondary | ICD-10-CM | POA: Diagnosis not present

## 2017-03-24 DIAGNOSIS — M25522 Pain in left elbow: Secondary | ICD-10-CM | POA: Diagnosis not present

## 2017-03-24 DIAGNOSIS — M160 Bilateral primary osteoarthritis of hip: Secondary | ICD-10-CM | POA: Diagnosis not present

## 2017-03-24 DIAGNOSIS — I120 Hypertensive chronic kidney disease with stage 5 chronic kidney disease or end stage renal disease: Secondary | ICD-10-CM | POA: Diagnosis not present

## 2017-03-24 DIAGNOSIS — N186 End stage renal disease: Secondary | ICD-10-CM | POA: Diagnosis not present

## 2017-03-25 DIAGNOSIS — D631 Anemia in chronic kidney disease: Secondary | ICD-10-CM | POA: Diagnosis not present

## 2017-03-25 DIAGNOSIS — D689 Coagulation defect, unspecified: Secondary | ICD-10-CM | POA: Diagnosis not present

## 2017-03-25 DIAGNOSIS — N2581 Secondary hyperparathyroidism of renal origin: Secondary | ICD-10-CM | POA: Diagnosis not present

## 2017-03-25 DIAGNOSIS — N186 End stage renal disease: Secondary | ICD-10-CM | POA: Diagnosis not present

## 2017-03-25 DIAGNOSIS — D509 Iron deficiency anemia, unspecified: Secondary | ICD-10-CM | POA: Diagnosis not present

## 2017-03-26 DIAGNOSIS — N186 End stage renal disease: Secondary | ICD-10-CM | POA: Diagnosis not present

## 2017-03-26 DIAGNOSIS — I2510 Atherosclerotic heart disease of native coronary artery without angina pectoris: Secondary | ICD-10-CM | POA: Diagnosis not present

## 2017-03-26 DIAGNOSIS — M160 Bilateral primary osteoarthritis of hip: Secondary | ICD-10-CM | POA: Diagnosis not present

## 2017-03-26 DIAGNOSIS — I120 Hypertensive chronic kidney disease with stage 5 chronic kidney disease or end stage renal disease: Secondary | ICD-10-CM | POA: Diagnosis not present

## 2017-03-26 DIAGNOSIS — M25522 Pain in left elbow: Secondary | ICD-10-CM | POA: Diagnosis not present

## 2017-03-27 ENCOUNTER — Other Ambulatory Visit: Payer: Self-pay

## 2017-03-27 ENCOUNTER — Encounter (HOSPITAL_COMMUNITY): Payer: Self-pay | Admitting: *Deleted

## 2017-03-27 DIAGNOSIS — D631 Anemia in chronic kidney disease: Secondary | ICD-10-CM | POA: Diagnosis not present

## 2017-03-27 DIAGNOSIS — N2581 Secondary hyperparathyroidism of renal origin: Secondary | ICD-10-CM | POA: Diagnosis not present

## 2017-03-27 DIAGNOSIS — N186 End stage renal disease: Secondary | ICD-10-CM | POA: Diagnosis not present

## 2017-03-27 DIAGNOSIS — D689 Coagulation defect, unspecified: Secondary | ICD-10-CM | POA: Diagnosis not present

## 2017-03-27 DIAGNOSIS — D509 Iron deficiency anemia, unspecified: Secondary | ICD-10-CM | POA: Diagnosis not present

## 2017-03-27 NOTE — Progress Notes (Signed)
Pt SDW-Pre-op call completed by pt daughter [name]. [name] denies that pt C/O any acute cardiopulmonary issues. [name] stated that pt is under the care of Dr. Fletcher Anon, Cardiology. [name] made aware to have pt stop taking otc vitamins, fish oil and herbal medications.  Do not take any NSAIDs ie: Ibuprofen, Advil, Naproxen (Aleve), Motrin, BC and Goody Powder. [name] verbalized understanding of all pre-op instructions. Anesthesia asked to review pt history (see note).

## 2017-03-27 NOTE — Progress Notes (Signed)
Anesthesia chart review: SAME DAY WORK-UP.  Patient is a 77 year old male scheduled for creation of right brachiocephalic AV fistula on 46/50/3546 by Dr. Deitra [name].  History includes ESRD (HD TTS via TDC, placed 10/22/16), former smoker (quit 10/18/16), prostate cancer (s/p radioactive seed implant), GERD, PAF, CAD/angina/MI (DES mid LAD 11/2016; ASA discontinued 12/2016 due to recurrent GI bleed, but Plavix continued), murmur, diastolic dysfunction, GI bleed '13, HTN, hypercholesterolemia, PVD, anemia.  - Nephrologist is Dr. Pearson [name]. - Cardiologist is DR. Kathlyn [name]. Last visit with Roger Hodgkins, NP on 02/01/17. Patient had DES 9/29018 (on Plavix only due to GI bleed). Patient was concerned that he was having belching and hiccups during dialysis. However there was no exertional symptoms. Simethicone was recommended. No cardiac testing was ordered. 3 month f/u planned.   Meds include allopurinol, amlodipine, Lipitor, calcitriol, Coreg, Plavix, hydralazine, Imdur, Zantac. Per [facility] RN Roger [name], PATIENT IS TO REMAIN ON PLAVIX.  EKG 12/20/16: SR with first degree AV block, cannot rule out anterior infarct, age undetermined.  Cardiac cath 12/04/16:  Colon [place] Cx to Prox Cx lesion, 100 %stenosed.  Prox RCA lesion, 100 %stenosed.  Prox LAD lesion, 40 %stenosed.  Ost 1st Diag to 1st Diag lesion, 70 %stenosed.  Mid RCA to Dist RCA lesion, 100 %stenosed.  Mid LAD lesion, 80 %stenosed.  Post intervention, there is a 0% residual stenosis.  A stent was successfully placed.  Ost 2nd Diag to 2nd Diag lesion, 60 %stenosed.  Successful angioplasty and drug-eluting stent placement to the mid LAD. Recommendations: Dual antiplatelet therapy for at least 6 months. Aggressive treatment of risk factors. The patient's dialysis schedule is Tuesdays, Thursdays and Saturdays.  The patient seems to have significant disease affecting the right common femoral artery. Recommend an outpatient lower extremity  arterial Doppler. (Previous LHC 10/23/16 showed significant three-vessel coronary artery disease. Chronically occluded right coronary artery with left to right collaterals supplying the right PDA. Chronically occluded proximal left circumflex which seems to be a small vessel overall with some left to left collaterals. Most of the lateral wall seems to be supplied by a large ramus branch. The LAD has a discrete 70% stenosis in a tortuous midsegment . The coronary arteries are overall moderately calcified and extremely tortuous likely due to hypertensive heart disease. There was not felt to be any significant advantage to CABG given non-critical LAD disease and only other graftable vessel would be RPDA. Delayed PCI LAD planned once recovered for GI bleed.)   Echo 10/20/16: Study Conclusions - Left ventricle: The cavity size was normal. There was mild focal   basal hypertrophy of the septum. Systolic function was normal.   The estimated ejection fraction was in the range of 55% to 60%.   Wall motion was normal; there were no regional wall motion   abnormalities. Features are consistent with a pseudonormal left   ventricular filling pattern, with concomitant abnormal relaxation   and increased filling pressure (grade 2 diastolic dysfunction).   Doppler parameters are consistent with high ventricular filling   pressure. - Aortic valve: Transvalvular velocity was within the normal range.   There was no stenosis. There was no regurgitation. - Mitral valve: Transvalvular velocity was within the normal range.   There was no evidence for stenosis. There was trivial   regurgitation. - Left atrium: The atrium was mildly dilated. - Right ventricle: The cavity size was normal. Wall thickness was   normal. Systolic function was normal. - Tricuspid valve: There was no regurgitation.  1V CXR 10/22/16:  IMPRESSION: Dialysis catheter tip in the upper right atrium region. Negative for pneumothorax. Central vascular  congestion or mild edema.  He is for labs on arrival.   Patient had DES in 11/2016, off ASA per cardiology due to recurrent GI bleed. He is to remain on Plavix perioperatively. REcent cardiology follow-up within past 3 months. Anesthesiologist to evaluate on the day of surgery.   Roger [name] [facility] Short Stay Center/Anesthesiology Phone [phone] 03/27/2017 2:54 PM

## 2017-03-28 ENCOUNTER — Ambulatory Visit (HOSPITAL_COMMUNITY): Payer: Medicare Other | Admitting: Vascular Surgery

## 2017-03-28 ENCOUNTER — Encounter (HOSPITAL_COMMUNITY): Payer: Self-pay | Admitting: Anesthesiology

## 2017-03-28 ENCOUNTER — Ambulatory Visit (HOSPITAL_COMMUNITY)
Admission: RE | Admit: 2017-03-28 | Discharge: 2017-03-28 | Disposition: A | Discharge status: Home / Self Care | Payer: Medicare Other | Source: Ambulatory Visit | Attending: Vascular Surgery | Admitting: Vascular Surgery

## 2017-03-28 ENCOUNTER — Encounter (HOSPITAL_COMMUNITY)
Admission: RE | Disposition: A | Discharge status: Home / Self Care | Payer: Self-pay | Source: Ambulatory Visit | Attending: Vascular Surgery

## 2017-03-28 DIAGNOSIS — I120 Hypertensive chronic kidney disease with stage 5 chronic kidney disease or end stage renal disease: Secondary | ICD-10-CM | POA: Insufficient documentation

## 2017-03-28 DIAGNOSIS — D631 Anemia in chronic kidney disease: Secondary | ICD-10-CM | POA: Insufficient documentation

## 2017-03-28 DIAGNOSIS — I2510 Atherosclerotic heart disease of native coronary artery without angina pectoris: Secondary | ICD-10-CM | POA: Diagnosis not present

## 2017-03-28 DIAGNOSIS — Z87891 Personal history of nicotine dependence: Secondary | ICD-10-CM | POA: Diagnosis not present

## 2017-03-28 DIAGNOSIS — Z7902 Long term (current) use of antithrombotics/antiplatelets: Secondary | ICD-10-CM | POA: Insufficient documentation

## 2017-03-28 DIAGNOSIS — E7800 Pure hypercholesterolemia, unspecified: Secondary | ICD-10-CM | POA: Insufficient documentation

## 2017-03-28 DIAGNOSIS — Z79899 Other long term (current) drug therapy: Secondary | ICD-10-CM | POA: Insufficient documentation

## 2017-03-28 DIAGNOSIS — N186 End stage renal disease: Secondary | ICD-10-CM | POA: Diagnosis not present

## 2017-03-28 DIAGNOSIS — M109 Gout, unspecified: Secondary | ICD-10-CM | POA: Insufficient documentation

## 2017-03-28 DIAGNOSIS — Z8546 Personal history of malignant neoplasm of prostate: Secondary | ICD-10-CM | POA: Diagnosis not present

## 2017-03-28 DIAGNOSIS — I739 Peripheral vascular disease, unspecified: Secondary | ICD-10-CM | POA: Insufficient documentation

## 2017-03-28 DIAGNOSIS — I252 Old myocardial infarction: Secondary | ICD-10-CM | POA: Insufficient documentation

## 2017-03-28 DIAGNOSIS — Z8249 Family history of ischemic heart disease and other diseases of the circulatory system: Secondary | ICD-10-CM | POA: Diagnosis not present

## 2017-03-28 DIAGNOSIS — I480 Paroxysmal atrial fibrillation: Secondary | ICD-10-CM | POA: Insufficient documentation

## 2017-03-28 DIAGNOSIS — D120 Benign neoplasm of cecum: Secondary | ICD-10-CM | POA: Diagnosis not present

## 2017-03-28 DIAGNOSIS — Z955 Presence of coronary angioplasty implant and graft: Secondary | ICD-10-CM | POA: Diagnosis not present

## 2017-03-28 DIAGNOSIS — K219 Gastro-esophageal reflux disease without esophagitis: Secondary | ICD-10-CM | POA: Insufficient documentation

## 2017-03-28 DIAGNOSIS — Z992 Dependence on renal dialysis: Secondary | ICD-10-CM | POA: Insufficient documentation

## 2017-03-28 DIAGNOSIS — N185 Chronic kidney disease, stage 5: Secondary | ICD-10-CM | POA: Diagnosis not present

## 2017-03-28 HISTORY — DX: Depression, unspecified: F32.A

## 2017-03-28 HISTORY — PX: AV FISTULA PLACEMENT: SHX1204

## 2017-03-28 HISTORY — DX: Major depressive disorder, single episode, unspecified: F32.9

## 2017-03-28 LAB — POCT I-STAT 4, (NA,K, GLUC, HGB,HCT)
GLUCOSE: 95 mg/dL (ref 65–99)
HCT: 32 % — ABNORMAL LOW (ref 39.0–52.0)
Hemoglobin: 10.9 g/dL — ABNORMAL LOW (ref 13.0–17.0)
POTASSIUM: 4 mmol/L (ref 3.5–5.1)
SODIUM: 139 mmol/L (ref 135–145)

## 2017-03-28 LAB — GLUCOSE, CAPILLARY: GLUCOSE-CAPILLARY: 85 mg/dL (ref 65–99)

## 2017-03-28 SURGERY — ARTERIOVENOUS (AV) FISTULA CREATION
Anesthesia: Monitor Anesthesia Care | Laterality: Right

## 2017-03-28 MED ORDER — OXYCODONE HCL 5 MG/5ML PO SOLN: 5.0000 mg | Freq: Once | ORAL | Status: DC | PRN

## 2017-03-28 MED ORDER — ONDANSETRON HCL 4 MG/2ML IJ SOLN
INTRAMUSCULAR | Status: DC | PRN
  Administered 2017-03-28: 4 mg via INTRAVENOUS

## 2017-03-28 MED ORDER — PHENYLEPHRINE 40 MCG/ML (10ML) SYRINGE FOR IV PUSH (FOR BLOOD PRESSURE SUPPORT)
PREFILLED_SYRINGE | INTRAVENOUS | Status: AC
  Filled 2017-03-28: qty 10

## 2017-03-28 MED ORDER — OXYCODONE-ACETAMINOPHEN 5-325 MG PO TABS: 1.0000 | ORAL_TABLET | ORAL | 0 refills | Status: DC | PRN

## 2017-03-28 MED ORDER — PAPAVERINE HCL 30 MG/ML IJ SOLN
INTRAMUSCULAR | Status: DC | PRN
  Administered 2017-03-28: 60 mg

## 2017-03-28 MED ORDER — PROPOFOL 500 MG/50ML IV EMUL
INTRAVENOUS | Status: DC | PRN
  Administered 2017-03-28: 25 ug/kg/min via INTRAVENOUS

## 2017-03-28 MED ORDER — HEPARIN SODIUM (PORCINE) 1000 UNIT/ML IJ SOLN
INTRAMUSCULAR | Status: AC
  Filled 2017-03-28: qty 1

## 2017-03-28 MED ORDER — HEPARIN SODIUM (PORCINE) 1000 UNIT/ML IJ SOLN
INTRAMUSCULAR | Status: DC | PRN
  Administered 2017-03-28: 7000 [IU] via INTRAVENOUS

## 2017-03-28 MED ORDER — PHENYLEPHRINE HCL 10 MG/ML IJ SOLN
INTRAMUSCULAR | Status: DC | PRN
  Administered 2017-03-28 (×3): 80 ug via INTRAVENOUS

## 2017-03-28 MED ORDER — LIDOCAINE HCL (CARDIAC) 20 MG/ML IV SOLN
INTRAVENOUS | Status: DC | PRN
  Administered 2017-03-28: 60 mg via INTRATRACHEAL

## 2017-03-28 MED ORDER — LIDOCAINE HCL (PF) 1 % IJ SOLN
INTRAMUSCULAR | Status: AC
  Filled 2017-03-28: qty 30

## 2017-03-28 MED ORDER — PROTAMINE SULFATE 10 MG/ML IV SOLN
INTRAVENOUS | Status: DC | PRN
  Administered 2017-03-28: 40 mg via INTRAVENOUS

## 2017-03-28 MED ORDER — FENTANYL CITRATE (PF) 250 MCG/5ML IJ SOLN
INTRAMUSCULAR | Status: AC
  Filled 2017-03-28: qty 5

## 2017-03-28 MED ORDER — LIDOCAINE-EPINEPHRINE (PF) 1 %-1:200000 IJ SOLN
INTRAMUSCULAR | Status: AC
  Filled 2017-03-28: qty 30

## 2017-03-28 MED ORDER — EPHEDRINE 5 MG/ML INJ
INTRAVENOUS | Status: AC
  Filled 2017-03-28: qty 10

## 2017-03-28 MED ORDER — SODIUM CHLORIDE 0.9 % IV SOLN
INTRAVENOUS | Status: DC
  Administered 2017-03-28 (×2): via INTRAVENOUS

## 2017-03-28 MED ORDER — LIDOCAINE HCL (PF) 1 % IJ SOLN
INTRAMUSCULAR | Status: DC | PRN
  Administered 2017-03-28: 12 mL

## 2017-03-28 MED ORDER — DEXTROSE 5 % IV SOLN
1.5000 g | INTRAVENOUS | Status: AC
  Administered 2017-03-28: 1.5 g via INTRAVENOUS
  Filled 2017-03-28: qty 1.5

## 2017-03-28 MED ORDER — OXYCODONE HCL 5 MG PO TABS: 5.0000 mg | ORAL_TABLET | Freq: Once | ORAL | Status: DC | PRN

## 2017-03-28 MED ORDER — ONDANSETRON HCL 4 MG/2ML IJ SOLN
INTRAMUSCULAR | Status: AC
  Filled 2017-03-28: qty 2

## 2017-03-28 MED ORDER — PROPOFOL 10 MG/ML IV BOLUS
INTRAVENOUS | Status: AC
  Filled 2017-03-28: qty 20

## 2017-03-28 MED ORDER — SODIUM CHLORIDE 0.9 % IV SOLN
INTRAVENOUS | Status: DC | PRN
  Administered 2017-03-28: 15:00:00

## 2017-03-28 MED ORDER — 0.9 % SODIUM CHLORIDE (POUR BTL) OPTIME
TOPICAL | Status: DC | PRN
  Administered 2017-03-28: 1000 mL

## 2017-03-28 MED ORDER — FENTANYL CITRATE (PF) 100 MCG/2ML IJ SOLN
INTRAMUSCULAR | Status: DC | PRN
  Administered 2017-03-28 (×2): 50 ug via INTRAVENOUS

## 2017-03-28 MED ORDER — EPHEDRINE SULFATE 50 MG/ML IJ SOLN
INTRAMUSCULAR | Status: DC | PRN
Start: 2017-03-28 — End: 2017-03-28
  Administered 2017-03-28: 10 mg via INTRAVENOUS

## 2017-03-28 MED ORDER — FENTANYL CITRATE (PF) 100 MCG/2ML IJ SOLN: 25.0000 ug | INTRAMUSCULAR | Status: DC | PRN

## 2017-03-28 MED ORDER — PAPAVERINE HCL 30 MG/ML IJ SOLN
INTRAMUSCULAR | Status: AC
  Filled 2017-03-28: qty 2

## 2017-03-28 MED ORDER — LIDOCAINE 2% (20 MG/ML) 5 ML SYRINGE
INTRAMUSCULAR | Status: AC
  Filled 2017-03-28: qty 5

## 2017-03-28 SURGICAL SUPPLY — 40 items
ADH SKN CLS APL DERMABOND .7 (GAUZE/BANDAGES/DRESSINGS) ×1
ARMBAND PINK RESTRICT EXTREMIT (MISCELLANEOUS) ×6 IMPLANT
CANISTER SUCT 3000ML PPV (MISCELLANEOUS) ×3 IMPLANT
CANNULA VESSEL 3MM 2 BLNT TIP (CANNULA) ×3 IMPLANT
CLIP VESOCCLUDE MED 6/CT (CLIP) ×3 IMPLANT
CLIP VESOCCLUDE SM WIDE 6/CT (CLIP) ×3 IMPLANT
COVER PROBE W GEL 5X96 (DRAPES) ×2 IMPLANT
DECANTER SPIKE VIAL GLASS SM (MISCELLANEOUS) ×3 IMPLANT
DERMABOND ADVANCED (GAUZE/BANDAGES/DRESSINGS) ×2
DERMABOND ADVANCED .7 DNX12 (GAUZE/BANDAGES/DRESSINGS) ×1 IMPLANT
ELECT REM PT RETURN 9FT ADLT (ELECTROSURGICAL) ×3
ELECTRODE REM PT RTRN 9FT ADLT (ELECTROSURGICAL) ×1 IMPLANT
GLOVE BIO SURGEON STRL SZ7 (GLOVE) ×2 IMPLANT
GLOVE BIO SURGEON STRL SZ7.5 (GLOVE) ×3 IMPLANT
GLOVE BIO SURGEON STRL SZ8 (GLOVE) ×2 IMPLANT
GLOVE BIOGEL PI IND STRL 7.0 (GLOVE) IMPLANT
GLOVE BIOGEL PI IND STRL 8 (GLOVE) ×1 IMPLANT
GLOVE BIOGEL PI INDICATOR 7.0 (GLOVE) ×4
GLOVE BIOGEL PI INDICATOR 8 (GLOVE) ×2
GLOVE SURG SS PI 6.5 STRL IVOR (GLOVE) ×2 IMPLANT
GOWN STRL REUS W/ TWL LRG LVL3 (GOWN DISPOSABLE) ×3 IMPLANT
GOWN STRL REUS W/ TWL XL LVL3 (GOWN DISPOSABLE) IMPLANT
GOWN STRL REUS W/TWL LRG LVL3 (GOWN DISPOSABLE) ×9
GOWN STRL REUS W/TWL XL LVL3 (GOWN DISPOSABLE) ×3
IV CATH 18G X1.75 CATHLON (IV SOLUTION) ×2 IMPLANT
KIT BASIN OR (CUSTOM PROCEDURE TRAY) ×3 IMPLANT
KIT ROOM TURNOVER OR (KITS) ×3 IMPLANT
NS IRRIG 1000ML POUR BTL (IV SOLUTION) ×3 IMPLANT
PACK CV ACCESS (CUSTOM PROCEDURE TRAY) ×3 IMPLANT
PAD ARMBOARD 7.5X6 YLW CONV (MISCELLANEOUS) ×6 IMPLANT
SPONGE SURGIFOAM ABS GEL 100 (HEMOSTASIS) IMPLANT
SUT PROLENE 6 0 BV (SUTURE) ×3 IMPLANT
SUT VIC AB 3-0 SH 27 (SUTURE) ×3
SUT VIC AB 3-0 SH 27X BRD (SUTURE) ×1 IMPLANT
SUT VICRYL 4-0 PS2 18IN ABS (SUTURE) ×3 IMPLANT
SYR 30ML LL (SYRINGE) ×2 IMPLANT
SYR 5ML LL (SYRINGE) ×2 IMPLANT
TOWEL GREEN STERILE (TOWEL DISPOSABLE) ×3 IMPLANT
UNDERPAD 30X30 (UNDERPADS AND DIAPERS) ×3 IMPLANT
WATER STERILE IRR 1000ML POUR (IV SOLUTION) ×3 IMPLANT

## 2017-03-28 NOTE — Transfer of Care (Signed)
Immediate Anesthesia Transfer of Care Note  Patient: Roger [name]  Procedure(s) Performed: ARTERIOVENOUS (AV) BRACHIOCEPHALIC FISTULA CREATION (Right )  Patient Location: PACU  Anesthesia Type:MAC  Level of Consciousness: awake and alert   Airway & Oxygen Therapy: Patient Spontanous Breathing and Patient connected to face mask oxygen  Post-op Assessment: Report given to RN and Post -op Vital signs reviewed and stable  Post vital signs: Reviewed and stable  Last Vitals:  Vitals:   03/28/17 1057 03/28/17 1522  BP: 121/66   Pulse: (!) 59   Resp: 19   Temp: 36.7 C (!) 36.4 C  SpO2: 100%     Last Pain:  Vitals:   03/28/17 1522  TempSrc:   PainSc: (P) Asleep      Patients Stated Pain Goal: 2 (83/41/96 2229)  Complications: No apparent anesthesia complications

## 2017-03-28 NOTE — Anesthesia Preprocedure Evaluation (Signed)
Anesthesia Evaluation  Patient identified by MRN, date of birth, ID band Patient awake    Reviewed: Allergy & Precautions, NPO status , Patient's Chart, lab work & pertinent test results, reviewed documented beta blocker date and time   History of Anesthesia Complications Negative for: history of anesthetic complications  Airway Mallampati: I  TM Distance: >3 FB Neck ROM: Full    Dental  (+) Poor Dentition, Missing   Pulmonary neg shortness of breath, neg sleep apnea, neg COPD, neg recent URI, former smoker,    breath sounds clear to auscultation       Cardiovascular hypertension, Pt. on medications and Pt. on home beta blockers + angina + CAD, + Past MI, + Cardiac Stents and + Peripheral Vascular Disease   Rhythm:Regular     Neuro/Psych PSYCHIATRIC DISORDERS Depression negative neurological ROS     GI/Hepatic Neg liver ROS, GERD  Medicated,  Endo/Other  negative endocrine ROS  Renal/GU ESRF and DialysisRenal disease     Musculoskeletal  (+) Arthritis ,   Abdominal   Peds  Hematology  (+) anemia ,   Anesthesia Other Findings 11/2016  Ost Cx to Prox Cx lesion, 100 %stenosed.  Prox RCA lesion, 100 %stenosed.  Prox LAD lesion, 40 %stenosed.  Patient is a 77 year old male scheduled for creation of right brachiocephalic AV fistula on 09/15/1599 by Dr. [name].  History includes ESRD (HD TTS via TDC, placed 10/22/16), former smoker (quit 10/18/16), prostate cancer (s/p radioactive seed implant), GERD, PAF, CAD/angina/MI (DES mid LAD 11/2016; ASA discontinued 12/2016 due to recurrent GI bleed, but Plavix continued), murmur, diastolic dysfunction, GI bleed '13, HTN, hypercholesterolemia, PVD, anemia.  - Nephrologist is Dr. [name]. - Cardiologist is DR. [name]. Last visit with [name], NP on 02/01/17. Patient had DES 9/29018 (on Plavix only due to GI bleed). Patient was concerned that he  was having belching and hiccups during dialysis. However there was no exertional symptoms. Simethicone was recommended. No cardiac testing was ordered. 3 month f/u planned.   Meds include allopurinol, amlodipine, Lipitor, calcitriol, Coreg, Plavix, hydralazine, Imdur, Zantac. Per [facility] RN [name], PATIENT IS TO REMAIN ON PLAVIX.  EKG 12/20/16: SR with first degree AV block, cannot rule out anterior infarct, age undetermined.  Cardiac cath 12/04/16:  [place] Cx to Prox Cx lesion, 100 %stenosed.  Prox RCA lesion, 100 %stenosed.  Prox LAD lesion, 40 %stenosed.  Ost 1st Diag to 1st Diag lesion, 70 %stenosed.  Mid RCA to Dist RCA lesion, 100 %stenosed.  Mid LAD lesion, 80 %stenosed.  Post intervention, there is a 0% residual stenosis.  A stent was successfully placed.  Ost 2nd Diag to 2nd Diag lesion, 60 %stenosed. Successful angioplasty and drug-eluting stent placement to the mid LAD. Recommendations: Dual antiplatelet therapy for at least 6 months. Aggressive treatment of risk factors. The patient's dialysis schedule is Tuesdays, Thursdays and Saturdays.  The patient seems to have significant disease affecting the right common femoral artery. Recommend an outpatient lower extremity arterial Doppler. (Previous LHC 10/23/16 showed significant three-vessel coronary artery disease. Chronically occluded right coronary artery with left to right collaterals supplying the right PDA. Chronically occluded proximal left circumflex which seems to be a small vessel overall with some left to left collaterals. Most of the lateral wall seems to be supplied by a large ramus branch. The LAD has a discrete 70% stenosis in a tortuous midsegment . The coronary arteries are overall moderately calcified and extremely tortuous likely due to hypertensive heart disease. There was not  felt to be any significant advantage to CABG given non-critical LAD disease and only other graftable vessel would be RPDA. Delayed PCI LAD  planned once recovered for GI bleed.)   Echo 10/20/16: Study Conclusions - Left ventricle: The cavity size was normal. There was mild focal basal hypertrophy of the septum. Systolic function was normal. The estimated ejection fraction was in the range of 55% to 60%. Wall motion was normal; there were no regional wall motion abnormalities. Features are consistent with a pseudonormal left ventricular filling pattern, with concomitant abnormal relaxation and increased filling pressure (grade 2 diastolic dysfunction). Doppler parameters are consistent with high ventricular filling pressure. - Aortic valve: Transvalvular velocity was within the normal range. There was no stenosis. There was no regurgitation. - Mitral valve: Transvalvular velocity was within the normal range. There was no evidence for stenosis. There was trivial regurgitation. - Left atrium: The atrium was mildly dilated. - Right ventricle: The cavity size was normal. Wall thickness was normal. Systolic function was normal. - Tricuspid valve: There was no regurgitation.  1V CXR 10/22/16: IMPRESSION: Dialysis catheter tip in the upper right atrium region. Negative for pneumothorax. Central vascular congestion or mild edema.     Ost 1st Diag to 1st Diag lesion, 70 %stenosed.  Mid RCA to Dist RCA lesion, 100 %stenosed.  Mid LAD lesion, 80 %stenosed.  Post intervention, there is a 0% residual stenosis.  A stent was successfully placed.  Ost 2nd Diag to 2nd Diag lesion, 60 %stenosed.  Reproductive/Obstetrics                            Anesthesia Physical Anesthesia Plan  ASA: III  Anesthesia Plan: MAC   Post-op Pain Management:    Induction: Intravenous  PONV Risk Score and Plan: 1 and Ondansetron  Airway Management Planned: Nasal Cannula  Additional Equipment:   Intra-op Plan:   Post-operative Plan:   Informed Consent: I have reviewed the patients  History and Physical, chart, labs and discussed the procedure including the risks, benefits and alternatives for the proposed anesthesia with the patient or authorized representative who has indicated his/her understanding and acceptance.   Dental advisory given  Plan Discussed with: Surgeon and CRNA  Anesthesia Plan Comments:         Anesthesia Quick Evaluation

## 2017-03-28 NOTE — Op Note (Signed)
    NAME: Roger [name]    MRN: [id number] DOB: 1940-06-14    DATE OF OPERATION: 03/28/2017  PREOP DIAGNOSIS:    End-stage renal disease  POSTOP DIAGNOSIS:    Same  PROCEDURE:    Right brachiocephalic AV fistula  SURGEON: Judeth Cornfield. Scot Dock, MD, FACS  ASSIST: Arlee Muslim PA  ANESTHESIA: Local with sedation  EBL: Minimal  INDICATIONS:    Roger [name] is a 77 y.o. male who has a functioning catheter.  He dialyzes on Tuesdays Thursdays and Saturdays.  He presents for placement of new access.  FINDINGS:   4 mm upper arm cephalic vein.  There was posterior plaque in the brachial artery.  He had a radial and ulnar signal at the wrist at the completion of the procedure.  TECHNIQUE:   The patient was taken to the operating room and was sedated.  The right arm was prepped and draped in usual sterile fashion.  After the skin was anesthetized with 1% lidocaine, a transverse incision was made just above the antecubital level.  Here the cephalic vein was dissected free and ligated distally.  It irrigated up with heparinized saline and was about a 4 mm vein.  The patient was heparinized.  The brachial artery was dissected free beneath the fascia.  The artery was clamped proximally and distally and a longitudinal arteriotomy was made.  There was posterior plaque present.  The vein was sewn end-to-side to the artery using continuous 6-0 Prolene suture.  At the completion there was a good thrill in the fistula.  There was a radial and ulnar signal with the Doppler.  The heparin was partially reversed with protamine.  The wound was closed with a deep layer of 3-0 Vicryl the skin closed with 4-0 Vicryl.  Dermabond was applied.  The patient tolerated the procedure well was transferred to the recovery room in stable condition.  All needle and sponge counts were correct.  [name], MD, FACS [facility]  DATE OF DICTATION:   03/28/2017

## 2017-03-28 NOTE — Anesthesia Procedure Notes (Signed)
Procedure Name: MAC Date/Time: 03/28/2017 2:12 PM Performed by: Scheryl Darter, CRNA Pre-anesthesia Checklist: Patient identified, Emergency Drugs available, Suction available, Patient being monitored and Timeout performed Patient Re-evaluated:Patient Re-evaluated prior to induction Oxygen Delivery Method: Simple face mask Placement Confirmation: positive ETCO2

## 2017-03-28 NOTE — Interval H&P Note (Signed)
History and Physical Interval Note:  03/28/2017 1:43 PM  Roger [name]  has presented today for surgery, with the diagnosis of end stage renal disease  The various methods of treatment have been discussed with the patient and family. After consideration of risks, benefits and other options for treatment, the patient has consented to  Procedure(s): ARTERIOVENOUS (AV) BRACHIOCEPHALIC FISTULA CREATION (Right) as a surgical intervention .  The patient's history has been reviewed, patient examined, no change in status, stable for surgery.  I have reviewed the patient's chart and labs.  Questions were answered to the patient's satisfaction.     [name]

## 2017-03-29 DIAGNOSIS — D631 Anemia in chronic kidney disease: Secondary | ICD-10-CM | POA: Diagnosis not present

## 2017-03-29 DIAGNOSIS — N2581 Secondary hyperparathyroidism of renal origin: Secondary | ICD-10-CM | POA: Diagnosis not present

## 2017-03-29 DIAGNOSIS — N186 End stage renal disease: Secondary | ICD-10-CM | POA: Diagnosis not present

## 2017-03-29 DIAGNOSIS — D689 Coagulation defect, unspecified: Secondary | ICD-10-CM | POA: Diagnosis not present

## 2017-03-29 DIAGNOSIS — D509 Iron deficiency anemia, unspecified: Secondary | ICD-10-CM | POA: Diagnosis not present

## 2017-03-29 NOTE — Anesthesia Postprocedure Evaluation (Signed)
Anesthesia Post Note  Patient: Roger [name]  Procedure(s) Performed: ARTERIOVENOUS (AV) BRACHIOCEPHALIC FISTULA CREATION (Right )     Patient location during evaluation: PACU Anesthesia Type: MAC Level of consciousness: awake and alert Pain management: pain level controlled Vital Signs Assessment: post-procedure vital signs reviewed and stable Respiratory status: spontaneous breathing, nonlabored ventilation, respiratory function stable and patient connected to nasal cannula oxygen Cardiovascular status: stable and blood pressure returned to baseline Postop Assessment: no apparent nausea or vomiting Anesthetic complications: no    Last Vitals:  Vitals:   03/28/17 1548 03/28/17 1633  BP: (!) 104/52 (!) 129/53  Pulse: (!) 56 (!) 58  Resp:  18  Temp:  36.4 C  SpO2:  100%    Last Pain:  Vitals:   03/28/17 1522  TempSrc:   PainSc: Asleep                 Anglea Gordner

## 2017-03-30 ENCOUNTER — Encounter (HOSPITAL_COMMUNITY): Payer: Self-pay | Admitting: Vascular Surgery

## 2017-03-31 DIAGNOSIS — M25522 Pain in left elbow: Secondary | ICD-10-CM | POA: Diagnosis not present

## 2017-03-31 DIAGNOSIS — I120 Hypertensive chronic kidney disease with stage 5 chronic kidney disease or end stage renal disease: Secondary | ICD-10-CM | POA: Diagnosis not present

## 2017-03-31 DIAGNOSIS — I2510 Atherosclerotic heart disease of native coronary artery without angina pectoris: Secondary | ICD-10-CM | POA: Diagnosis not present

## 2017-03-31 DIAGNOSIS — M160 Bilateral primary osteoarthritis of hip: Secondary | ICD-10-CM | POA: Diagnosis not present

## 2017-03-31 DIAGNOSIS — N186 End stage renal disease: Secondary | ICD-10-CM | POA: Diagnosis not present

## 2017-04-01 ENCOUNTER — Encounter: Payer: Self-pay | Admitting: Nephrology

## 2017-04-01 DIAGNOSIS — D689 Coagulation defect, unspecified: Secondary | ICD-10-CM | POA: Diagnosis not present

## 2017-04-01 DIAGNOSIS — N2581 Secondary hyperparathyroidism of renal origin: Secondary | ICD-10-CM | POA: Diagnosis not present

## 2017-04-01 DIAGNOSIS — D509 Iron deficiency anemia, unspecified: Secondary | ICD-10-CM | POA: Diagnosis not present

## 2017-04-01 DIAGNOSIS — D631 Anemia in chronic kidney disease: Secondary | ICD-10-CM | POA: Diagnosis not present

## 2017-04-01 DIAGNOSIS — N186 End stage renal disease: Secondary | ICD-10-CM | POA: Diagnosis not present

## 2017-04-02 DIAGNOSIS — M160 Bilateral primary osteoarthritis of hip: Secondary | ICD-10-CM | POA: Diagnosis not present

## 2017-04-02 DIAGNOSIS — I120 Hypertensive chronic kidney disease with stage 5 chronic kidney disease or end stage renal disease: Secondary | ICD-10-CM | POA: Diagnosis not present

## 2017-04-02 DIAGNOSIS — N186 End stage renal disease: Secondary | ICD-10-CM | POA: Diagnosis not present

## 2017-04-02 DIAGNOSIS — I2510 Atherosclerotic heart disease of native coronary artery without angina pectoris: Secondary | ICD-10-CM | POA: Diagnosis not present

## 2017-04-02 DIAGNOSIS — M25522 Pain in left elbow: Secondary | ICD-10-CM | POA: Diagnosis not present

## 2017-04-03 ENCOUNTER — Telehealth: Payer: Self-pay

## 2017-04-03 DIAGNOSIS — N2581 Secondary hyperparathyroidism of renal origin: Secondary | ICD-10-CM | POA: Diagnosis not present

## 2017-04-03 DIAGNOSIS — N186 End stage renal disease: Secondary | ICD-10-CM | POA: Diagnosis not present

## 2017-04-03 DIAGNOSIS — D689 Coagulation defect, unspecified: Secondary | ICD-10-CM | POA: Diagnosis not present

## 2017-04-03 DIAGNOSIS — D509 Iron deficiency anemia, unspecified: Secondary | ICD-10-CM | POA: Diagnosis not present

## 2017-04-03 DIAGNOSIS — D631 Anemia in chronic kidney disease: Secondary | ICD-10-CM | POA: Diagnosis not present

## 2017-04-03 NOTE — Telephone Encounter (Signed)
Returned call to patient's daughter. She called to let us know that he was having a little swelling, numbness and tingling in his right hand. Patient had fistula placement on 03/28/17. Fingers were warm and pink in color. Instructed daughter to have patient keep right arm elevated above his heart while he was sitting or at rest. If the swelling worsened or if fingers turn cold or become pale/blue to call us back immediately during office hours or go to the emergency room.

## 2017-04-05 DIAGNOSIS — D631 Anemia in chronic kidney disease: Secondary | ICD-10-CM | POA: Diagnosis not present

## 2017-04-05 DIAGNOSIS — D689 Coagulation defect, unspecified: Secondary | ICD-10-CM | POA: Diagnosis not present

## 2017-04-05 DIAGNOSIS — N2581 Secondary hyperparathyroidism of renal origin: Secondary | ICD-10-CM | POA: Diagnosis not present

## 2017-04-05 DIAGNOSIS — N186 End stage renal disease: Secondary | ICD-10-CM | POA: Diagnosis not present

## 2017-04-05 DIAGNOSIS — D509 Iron deficiency anemia, unspecified: Secondary | ICD-10-CM | POA: Diagnosis not present

## 2017-04-08 DIAGNOSIS — D509 Iron deficiency anemia, unspecified: Secondary | ICD-10-CM | POA: Diagnosis not present

## 2017-04-08 DIAGNOSIS — N186 End stage renal disease: Secondary | ICD-10-CM | POA: Diagnosis not present

## 2017-04-08 DIAGNOSIS — D631 Anemia in chronic kidney disease: Secondary | ICD-10-CM | POA: Diagnosis not present

## 2017-04-08 DIAGNOSIS — N2581 Secondary hyperparathyroidism of renal origin: Secondary | ICD-10-CM | POA: Diagnosis not present

## 2017-04-08 DIAGNOSIS — D689 Coagulation defect, unspecified: Secondary | ICD-10-CM | POA: Diagnosis not present

## 2017-04-09 DIAGNOSIS — M160 Bilateral primary osteoarthritis of hip: Secondary | ICD-10-CM | POA: Diagnosis not present

## 2017-04-09 DIAGNOSIS — I120 Hypertensive chronic kidney disease with stage 5 chronic kidney disease or end stage renal disease: Secondary | ICD-10-CM | POA: Diagnosis not present

## 2017-04-09 DIAGNOSIS — N186 End stage renal disease: Secondary | ICD-10-CM | POA: Diagnosis not present

## 2017-04-09 DIAGNOSIS — I2510 Atherosclerotic heart disease of native coronary artery without angina pectoris: Secondary | ICD-10-CM | POA: Diagnosis not present

## 2017-04-09 DIAGNOSIS — M25522 Pain in left elbow: Secondary | ICD-10-CM | POA: Diagnosis not present

## 2017-04-10 DIAGNOSIS — N186 End stage renal disease: Secondary | ICD-10-CM | POA: Diagnosis not present

## 2017-04-10 DIAGNOSIS — D631 Anemia in chronic kidney disease: Secondary | ICD-10-CM | POA: Diagnosis not present

## 2017-04-10 DIAGNOSIS — N2581 Secondary hyperparathyroidism of renal origin: Secondary | ICD-10-CM | POA: Diagnosis not present

## 2017-04-10 DIAGNOSIS — D509 Iron deficiency anemia, unspecified: Secondary | ICD-10-CM | POA: Diagnosis not present

## 2017-04-10 DIAGNOSIS — D689 Coagulation defect, unspecified: Secondary | ICD-10-CM | POA: Diagnosis not present

## 2017-04-11 DIAGNOSIS — N186 End stage renal disease: Secondary | ICD-10-CM | POA: Diagnosis not present

## 2017-04-11 DIAGNOSIS — I2510 Atherosclerotic heart disease of native coronary artery without angina pectoris: Secondary | ICD-10-CM | POA: Diagnosis not present

## 2017-04-11 DIAGNOSIS — M25522 Pain in left elbow: Secondary | ICD-10-CM | POA: Diagnosis not present

## 2017-04-11 DIAGNOSIS — M160 Bilateral primary osteoarthritis of hip: Secondary | ICD-10-CM | POA: Diagnosis not present

## 2017-04-11 DIAGNOSIS — I120 Hypertensive chronic kidney disease with stage 5 chronic kidney disease or end stage renal disease: Secondary | ICD-10-CM | POA: Diagnosis not present

## 2017-04-12 DIAGNOSIS — N2581 Secondary hyperparathyroidism of renal origin: Secondary | ICD-10-CM | POA: Diagnosis not present

## 2017-04-12 DIAGNOSIS — D689 Coagulation defect, unspecified: Secondary | ICD-10-CM | POA: Diagnosis not present

## 2017-04-12 DIAGNOSIS — D509 Iron deficiency anemia, unspecified: Secondary | ICD-10-CM | POA: Diagnosis not present

## 2017-04-12 DIAGNOSIS — N186 End stage renal disease: Secondary | ICD-10-CM | POA: Diagnosis not present

## 2017-04-12 DIAGNOSIS — D631 Anemia in chronic kidney disease: Secondary | ICD-10-CM | POA: Diagnosis not present

## 2017-04-14 DIAGNOSIS — E7800 Pure hypercholesterolemia, unspecified: Secondary | ICD-10-CM | POA: Diagnosis not present

## 2017-04-14 DIAGNOSIS — N186 End stage renal disease: Secondary | ICD-10-CM | POA: Diagnosis not present

## 2017-04-14 DIAGNOSIS — I10 Essential (primary) hypertension: Secondary | ICD-10-CM | POA: Diagnosis not present

## 2017-04-14 DIAGNOSIS — D500 Iron deficiency anemia secondary to blood loss (chronic): Secondary | ICD-10-CM | POA: Diagnosis not present

## 2017-04-14 DIAGNOSIS — I25118 Atherosclerotic heart disease of native coronary artery with other forms of angina pectoris: Secondary | ICD-10-CM | POA: Diagnosis not present

## 2017-04-15 DIAGNOSIS — N186 End stage renal disease: Secondary | ICD-10-CM | POA: Diagnosis not present

## 2017-04-15 DIAGNOSIS — N2581 Secondary hyperparathyroidism of renal origin: Secondary | ICD-10-CM | POA: Diagnosis not present

## 2017-04-15 DIAGNOSIS — D631 Anemia in chronic kidney disease: Secondary | ICD-10-CM | POA: Diagnosis not present

## 2017-04-15 DIAGNOSIS — D689 Coagulation defect, unspecified: Secondary | ICD-10-CM | POA: Diagnosis not present

## 2017-04-15 DIAGNOSIS — D509 Iron deficiency anemia, unspecified: Secondary | ICD-10-CM | POA: Diagnosis not present

## 2017-04-16 DIAGNOSIS — I120 Hypertensive chronic kidney disease with stage 5 chronic kidney disease or end stage renal disease: Secondary | ICD-10-CM | POA: Diagnosis not present

## 2017-04-16 DIAGNOSIS — N186 End stage renal disease: Secondary | ICD-10-CM | POA: Diagnosis not present

## 2017-04-16 DIAGNOSIS — I2510 Atherosclerotic heart disease of native coronary artery without angina pectoris: Secondary | ICD-10-CM | POA: Diagnosis not present

## 2017-04-16 DIAGNOSIS — M25522 Pain in left elbow: Secondary | ICD-10-CM | POA: Diagnosis not present

## 2017-04-16 DIAGNOSIS — M160 Bilateral primary osteoarthritis of hip: Secondary | ICD-10-CM | POA: Diagnosis not present

## 2017-04-17 DIAGNOSIS — D689 Coagulation defect, unspecified: Secondary | ICD-10-CM | POA: Diagnosis not present

## 2017-04-17 DIAGNOSIS — N2581 Secondary hyperparathyroidism of renal origin: Secondary | ICD-10-CM | POA: Diagnosis not present

## 2017-04-17 DIAGNOSIS — D631 Anemia in chronic kidney disease: Secondary | ICD-10-CM | POA: Diagnosis not present

## 2017-04-17 DIAGNOSIS — I129 Hypertensive chronic kidney disease with stage 1 through stage 4 chronic kidney disease, or unspecified chronic kidney disease: Secondary | ICD-10-CM | POA: Diagnosis not present

## 2017-04-17 DIAGNOSIS — D509 Iron deficiency anemia, unspecified: Secondary | ICD-10-CM | POA: Diagnosis not present

## 2017-04-17 DIAGNOSIS — N186 End stage renal disease: Secondary | ICD-10-CM | POA: Diagnosis not present

## 2017-04-17 DIAGNOSIS — Z992 Dependence on renal dialysis: Secondary | ICD-10-CM | POA: Diagnosis not present

## 2017-04-18 DIAGNOSIS — N186 End stage renal disease: Secondary | ICD-10-CM | POA: Diagnosis not present

## 2017-04-18 DIAGNOSIS — Z992 Dependence on renal dialysis: Secondary | ICD-10-CM | POA: Diagnosis not present

## 2017-04-18 DIAGNOSIS — I129 Hypertensive chronic kidney disease with stage 1 through stage 4 chronic kidney disease, or unspecified chronic kidney disease: Secondary | ICD-10-CM | POA: Diagnosis not present

## 2017-04-19 DIAGNOSIS — D509 Iron deficiency anemia, unspecified: Secondary | ICD-10-CM | POA: Diagnosis not present

## 2017-04-19 DIAGNOSIS — N186 End stage renal disease: Secondary | ICD-10-CM | POA: Diagnosis not present

## 2017-04-19 DIAGNOSIS — D689 Coagulation defect, unspecified: Secondary | ICD-10-CM | POA: Diagnosis not present

## 2017-04-19 DIAGNOSIS — D631 Anemia in chronic kidney disease: Secondary | ICD-10-CM | POA: Diagnosis not present

## 2017-04-19 DIAGNOSIS — N2581 Secondary hyperparathyroidism of renal origin: Secondary | ICD-10-CM | POA: Diagnosis not present

## 2017-04-22 DIAGNOSIS — N186 End stage renal disease: Secondary | ICD-10-CM | POA: Diagnosis not present

## 2017-04-22 DIAGNOSIS — D689 Coagulation defect, unspecified: Secondary | ICD-10-CM | POA: Diagnosis not present

## 2017-04-22 DIAGNOSIS — D509 Iron deficiency anemia, unspecified: Secondary | ICD-10-CM | POA: Diagnosis not present

## 2017-04-22 DIAGNOSIS — D631 Anemia in chronic kidney disease: Secondary | ICD-10-CM | POA: Diagnosis not present

## 2017-04-22 DIAGNOSIS — N2581 Secondary hyperparathyroidism of renal origin: Secondary | ICD-10-CM | POA: Diagnosis not present

## 2017-04-24 DIAGNOSIS — D509 Iron deficiency anemia, unspecified: Secondary | ICD-10-CM | POA: Diagnosis not present

## 2017-04-24 DIAGNOSIS — D689 Coagulation defect, unspecified: Secondary | ICD-10-CM | POA: Diagnosis not present

## 2017-04-24 DIAGNOSIS — N2581 Secondary hyperparathyroidism of renal origin: Secondary | ICD-10-CM | POA: Diagnosis not present

## 2017-04-24 DIAGNOSIS — D631 Anemia in chronic kidney disease: Secondary | ICD-10-CM | POA: Diagnosis not present

## 2017-04-24 DIAGNOSIS — N186 End stage renal disease: Secondary | ICD-10-CM | POA: Diagnosis not present

## 2017-04-25 ENCOUNTER — Ambulatory Visit: Payer: Medicare Other | Admitting: Cardiovascular Disease

## 2017-04-25 ENCOUNTER — Encounter: Payer: Self-pay | Admitting: Cardiovascular Disease

## 2017-04-25 VITALS — BP 140/60 | HR 63 | Ht 66.0 in | Wt 179.0 lb

## 2017-04-25 DIAGNOSIS — E785 Hyperlipidemia, unspecified: Secondary | ICD-10-CM

## 2017-04-25 DIAGNOSIS — I739 Peripheral vascular disease, unspecified: Secondary | ICD-10-CM

## 2017-04-25 DIAGNOSIS — R0989 Other specified symptoms and signs involving the circulatory and respiratory systems: Secondary | ICD-10-CM | POA: Diagnosis not present

## 2017-04-25 DIAGNOSIS — I10 Essential (primary) hypertension: Secondary | ICD-10-CM | POA: Diagnosis not present

## 2017-04-25 DIAGNOSIS — I25118 Atherosclerotic heart disease of native coronary artery with other forms of angina pectoris: Secondary | ICD-10-CM

## 2017-04-25 MED ORDER — CARVEDILOL 6.25 MG PO TABS: 6.2500 mg | ORAL_TABLET | Freq: Two times a day (BID) | ORAL | 3 refills | Status: DC

## 2017-04-25 NOTE — Progress Notes (Signed)
Cardiology Office Note   Date:  04/25/2017   ID:  Roger [name], DOB [birth year], MRN [id number]  PCP:  Patient, No Pcp Per  Cardiologist:   [name], MD   Chief Complaint  Patient presents with  . other    3 month follow up. Meds reviewed by the pt.'s med list. "doing well."       History of Present Illness: Roger [name] is a 77 y.o. male who presents for a follow-up visit regarding coronary artery disease. He has extensive medical problems that include peripheral arterial disease, tobacco use, end-stage renal disease on hemodialysis, anemia, paroxysmal atrial fibrillation, recurrent GI bleed and coronary artery disease. He had unstable angina in August in the setting of GI bleed. Cardiac catheterization was done which showed significant three-vessel coronary artery disease with chronically occluded right coronary artery with left-to-right collaterals, chronically occluded proximal left circumflex which seem to be a small vessel with left to left collaterals and 70% stenosis in the mid LAD. LVEDP was moderately elevated. EF was normal by echo.  No good targets for CABG except in the LAD territory.  The patient underwent staged LAD PCI with drug-eluting stent placement on September 19. He was noted to have PAD affecting his right common femoral artery.  The patient had recurrent GI bleed and dual antiplatelet therapy was interrupted.  Ultimately, we decided to place him on Plavix monotherapy and he has been tolerating this without further bleeding.  He did go to the emergency room at [facility] few months ago with chest discomfort and mostly belching and GI symptoms.  Troponin was normal.  Hydralazine was decreased and carvedilol was increased.  Since then he actually felt better. He still describes some upper abdominal discomfort and belching during dialysis.  Past Medical History:  Diagnosis Date  . Anemia   . Anginal pain ([place])   . Arthritis    "all over; bad in the legs"  (12/04/2016)  . CAD (coronary artery disease)    a. NSTEMI in setting of anemia; b. 10/2016 MV: inflat ST dep with large, reversible ant, apical, inflat defect; c. 10/2016 Cath: LM nl, LAD 40p, 66m, D1 70, D2 80, RI min irregs, LCX 100ost, RCA 100p, 154m/d, RPDA fills via collats from LAD, RPAV small-->Initial conservative Rx in setting of GIB w/ plan for PCI LAD if H/H stable on ASA/Plavix;  d. 12/04/16 s/p PTCA and DES to mLAD.  Marland Kitchen Depression    situational   . Diastolic dysfunction    a. 10/2016 Echo: EF 55-60%, no rwma, Gr2 DD, triv MR, mildly dil LA.  Marland Kitchen ESRD (end stage renal disease) ([place])    a. 10/2016 HD catheter insterted and HD initiated; b. Pending permanent HD access folllowing Cardiac Revascularization.  . ESRD on dialysis [facility])    "[place]; [address]; TTS usually; going to [facility] in [place], [place] right now while staying w/daughter" (12/04/2016)  . GERD (gastroesophageal reflux disease)   . GIB (gastrointestinal bleeding) 10/2011   a. 10/2016 3u PRBCs - EGD/Colonoscopy: angiodysplasia w/ diverticulosis. No apparent bleeding. Polypecotmy->tubular adenomas.  . Gout   . Heart murmur   . High cholesterol   . History of blood transfusion 10/18/2016   "related to anemia" (12/04/2016)  . Hypertension   . Iron deficiency anemia   . Myocardial infarction ([place]) 10/18/2016  . Old MI (myocardial infarction)    "found evidence of this on 10/18/2016"  . PAF (paroxysmal atrial fibrillation) ([place])   . Peripheral vascular disease ([place])  BLE  . Prostate cancer ([place])    a. s/p seed implantation.  . Tobacco abuse     Past Surgical History:  Procedure Laterality Date  . AV FISTULA PLACEMENT Right 03/28/2017   Procedure: ARTERIOVENOUS (AV) BRACHIOCEPHALIC FISTULA CREATION;  Surgeon: Angelia Mould, MD;  Location: [place];  Service: Vascular;  Laterality: Right;  . CARDIAC CATHETERIZATION  10/2016  . COLONOSCOPY WITH PROPOFOL N/A 10/21/2016   Procedure: COLONOSCOPY WITH PROPOFOL;  Surgeon:  Gatha Mayer, MD;  Location: [facility] ENDOSCOPY;  Service: Endoscopy;  Laterality: N/A;  . CORONARY ANGIOPLASTY WITH STENT PLACEMENT  12/04/2016   "1 stent"  . CORONARY STENT INTERVENTION N/A 12/04/2016   Procedure: CORONARY STENT INTERVENTION;  Surgeon: Wellington Hampshire, MD;  Location: [place] CV LAB;  Service: Cardiovascular;  Laterality: N/A;  . ESOPHAGOGASTRODUODENOSCOPY N/A 10/21/2016   Procedure: ESOPHAGOGASTRODUODENOSCOPY (EGD);  Surgeon: Gatha Mayer, MD;  Location: [facility] ENDOSCOPY;  Service: Endoscopy;  Laterality: N/A;  . INSERTION OF DIALYSIS CATHETER Right 10/22/2016   Procedure: INSERTION OF TUNNELED DIALYSIS CATHETER;  Surgeon: Elam Dutch, MD;  Location: [place];  Service: Vascular;  Laterality: Right;  . LEFT HEART CATH AND CORONARY ANGIOGRAPHY N/A 10/23/2016   Procedure: LEFT HEART CATH AND CORONARY ANGIOGRAPHY;  Surgeon: Wellington Hampshire, MD;  Location: [place] CV LAB;  Service: Cardiovascular;  Laterality: N/A;  . TONSILLECTOMY       Current Outpatient Medications  Medication Sig Dispense Refill  . allopurinol (ZYLOPRIM) 100 MG tablet Take 1 tablet (100 mg total) by mouth daily. 30 tablet 0  . amLODipine (NORVASC) 10 MG tablet Take 1 tablet (10 mg total) by mouth daily. 90 tablet 3  . atorvastatin (LIPITOR) 80 MG tablet Take 1 tablet (80 mg total) by mouth daily at 6 PM. 90 tablet 3  . b complex vitamins tablet Take 1 tablet by mouth at bedtime. (2300)    . calcitRIOL (ROCALTROL) 0.25 MCG capsule Take 1 capsule (0.25 mcg total) by mouth Every Tuesday,Thursday,and Saturday with dialysis. (Patient taking differently: Take 0.25 mcg by mouth See admin instructions. Take 0.25 mcg by mouth daily on Tuesday and Saturday) 30 capsule 0  . carvedilol (COREG) 6.25 MG tablet Take 6.25 mg by mouth 2 (two) times daily with a meal.    . clopidogrel (PLAVIX) 75 MG tablet Take 1 tablet (75 mg total) by mouth daily. 90 tablet 3  . hydrALAZINE (APRESOLINE) 25 MG tablet Take 25 mg by mouth  every 8 (eight) hours.    . isosorbide mononitrate (IMDUR) 30 MG 24 hr tablet Take 30 mg by mouth daily.    Marland Kitchen oxyCODONE-acetaminophen (ROXICET) 5-325 MG tablet Take 1-2 tablets by mouth every 4 (four) hours as needed. 20 tablet 0  . ranitidine (ZANTAC) 150 MG tablet Take 150 mg by mouth daily as needed for heartburn.      No current facility-administered medications for this visit.     Allergies:   Patient has no known allergies.    Social History:  The patient  reports that he quit smoking about 6 months ago. His smoking use included cigarettes. He has a 25.00 pack-year smoking history. he has never used smokeless tobacco. He reports that he does not drink alcohol or use drugs.   Family History:  The patient's family history includes Cancer in his brother, father, mother, and sister; Diabetes in his brother and father; Heart attack in his father; Hypertension in his brother, daughter, and father.    ROS:  Please see  the history of present illness.   Otherwise, review of systems are positive for none.   All other systems are reviewed and negative.    PHYSICAL EXAM: VS:  BP 140/60 (BP Location: Left Arm, Patient Position: Sitting, Cuff Size: Normal)   Ht 5\' 6"  (1.676 m)   Wt 179 lb (81.2 kg)   BMI 28.89 kg/m  , BMI Body mass index is 28.89 kg/m. GEN: Well nourished, well developed, in no acute distress  HEENT: normal  Neck: no JVD, or masses.  Bilateral carotid bruits Cardiac: RRR; no  rubs, or gallops,no edema . 2/6 systolic ejection murmur at the aortic area Respiratory:  clear to auscultation bilaterally, normal work of breathing GI: soft, nontender, nondistended, + BS MS: no deformity or atrophy  Skin: warm and dry, no rash Neuro:  Strength and sensation are intact Psych: euthymic mood, full affect   EKG:  EKG is ordered today. The ekg ordered today demonstrates normal sinus rhythm with first degree AV block.  Nonspecific ST changes.   Recent Labs: 10/19/2016: Magnesium  2.0; TSH 2.539 10/21/2016: ALT 9 12/05/2016: BUN 22; Creatinine, Ser 5.97; Platelets 201 03/28/2017: Hemoglobin 10.9; Potassium 4.0; Sodium 139    Lipid Panel    Component Value Date/Time   CHOL 208 (H) 10/20/2016 0228   TRIG 225 (H) 10/20/2016 0228   HDL 23 (L) 10/20/2016 0228   CHOLHDL 9.0 10/20/2016 0228   VLDL 45 (H) 10/20/2016 0228   LDLCALC 140 (H) 10/20/2016 0228      Wt Readings from Last 3 Encounters:  04/25/17 179 lb (81.2 kg)  03/28/17 180 lb (81.6 kg)  02/26/17 177 lb 9.6 oz (80.6 kg)     No flowsheet data found.    ASSESSMENT AND PLAN:  1.  Coronary artery disease involving native coronary arteries with other forms of angina: The patient is status post angioplasty and drug-eluting stent placement to the mid LAD in September 2018.  He is tolerating Plavix monotherapy without recurrent GI bleed.  I do suspect that belching and other GI symptoms are likely angina equivalent.  This is mostly noticeable during dialysis which could be due to episodes of hypotension.  I elected to discontinue hydralazine especially that his ejection fraction was normal.  2. Blood loss anemia: Most recent hemoglobin was stable above 10.  3. Hyperlipidemia: Continue high dose atorvastatin.  4. Essential hypertension: Blood pressure is controlled on current medications.  I discontinued hydralazine in order to allow blood pressure to run higher during dialysis.  If blood pressure starts going up, we can increase Imdur.  5. Peripheral arterial disease: Continue medical therapy.  The patient denies claudication.  He walks slowly with a walker.  6.  Bilateral carotid bruits: I requested carotid Doppler.  Disposition:   FU with me in 4 months.  Signed,  [name], MD  04/25/2017 11:26 AM    [place]

## 2017-04-25 NOTE — Patient Instructions (Signed)
Medication Instructions:  Your physician has recommended you make the following change in your medication:  STOP taking hydralazine   Labwork: none  Testing/Procedures: Your physician has requested that you have a carotid duplex. This test is an ultrasound of the carotid arteries in your neck. It looks at blood flow through these arteries that supply the brain with blood. Allow one hour for this exam. There are no restrictions or special instructions.    Follow-Up: Your physician recommends that you schedule a follow-up appointment in: 4 months with Dr. Fletcher Anon.    Any Other Special Instructions Will Be Listed Below (If Applicable).     If you need a refill on your cardiac medications before your next appointment, please call your pharmacy.

## 2017-04-26 DIAGNOSIS — N186 End stage renal disease: Secondary | ICD-10-CM | POA: Diagnosis not present

## 2017-04-26 DIAGNOSIS — N2581 Secondary hyperparathyroidism of renal origin: Secondary | ICD-10-CM | POA: Diagnosis not present

## 2017-04-26 DIAGNOSIS — D631 Anemia in chronic kidney disease: Secondary | ICD-10-CM | POA: Diagnosis not present

## 2017-04-26 DIAGNOSIS — D509 Iron deficiency anemia, unspecified: Secondary | ICD-10-CM | POA: Diagnosis not present

## 2017-04-26 DIAGNOSIS — D689 Coagulation defect, unspecified: Secondary | ICD-10-CM | POA: Diagnosis not present

## 2017-04-29 DIAGNOSIS — N2581 Secondary hyperparathyroidism of renal origin: Secondary | ICD-10-CM | POA: Diagnosis not present

## 2017-04-29 DIAGNOSIS — D689 Coagulation defect, unspecified: Secondary | ICD-10-CM | POA: Diagnosis not present

## 2017-04-29 DIAGNOSIS — N186 End stage renal disease: Secondary | ICD-10-CM | POA: Diagnosis not present

## 2017-04-29 DIAGNOSIS — D631 Anemia in chronic kidney disease: Secondary | ICD-10-CM | POA: Diagnosis not present

## 2017-04-29 DIAGNOSIS — D509 Iron deficiency anemia, unspecified: Secondary | ICD-10-CM | POA: Diagnosis not present

## 2017-05-01 DIAGNOSIS — D631 Anemia in chronic kidney disease: Secondary | ICD-10-CM | POA: Diagnosis not present

## 2017-05-01 DIAGNOSIS — D509 Iron deficiency anemia, unspecified: Secondary | ICD-10-CM | POA: Diagnosis not present

## 2017-05-01 DIAGNOSIS — D689 Coagulation defect, unspecified: Secondary | ICD-10-CM | POA: Diagnosis not present

## 2017-05-01 DIAGNOSIS — N2581 Secondary hyperparathyroidism of renal origin: Secondary | ICD-10-CM | POA: Diagnosis not present

## 2017-05-01 DIAGNOSIS — N186 End stage renal disease: Secondary | ICD-10-CM | POA: Diagnosis not present

## 2017-05-03 DIAGNOSIS — N2581 Secondary hyperparathyroidism of renal origin: Secondary | ICD-10-CM | POA: Diagnosis not present

## 2017-05-03 DIAGNOSIS — D509 Iron deficiency anemia, unspecified: Secondary | ICD-10-CM | POA: Diagnosis not present

## 2017-05-03 DIAGNOSIS — N186 End stage renal disease: Secondary | ICD-10-CM | POA: Diagnosis not present

## 2017-05-03 DIAGNOSIS — D689 Coagulation defect, unspecified: Secondary | ICD-10-CM | POA: Diagnosis not present

## 2017-05-03 DIAGNOSIS — D631 Anemia in chronic kidney disease: Secondary | ICD-10-CM | POA: Diagnosis not present

## 2017-05-06 DIAGNOSIS — N186 End stage renal disease: Secondary | ICD-10-CM | POA: Diagnosis not present

## 2017-05-06 DIAGNOSIS — D689 Coagulation defect, unspecified: Secondary | ICD-10-CM | POA: Diagnosis not present

## 2017-05-06 DIAGNOSIS — N2581 Secondary hyperparathyroidism of renal origin: Secondary | ICD-10-CM | POA: Diagnosis not present

## 2017-05-06 DIAGNOSIS — D631 Anemia in chronic kidney disease: Secondary | ICD-10-CM | POA: Diagnosis not present

## 2017-05-06 DIAGNOSIS — D509 Iron deficiency anemia, unspecified: Secondary | ICD-10-CM | POA: Diagnosis not present

## 2017-05-07 ENCOUNTER — Ambulatory Visit (INDEPENDENT_AMBULATORY_CARE_PROVIDER_SITE_OTHER): Payer: Self-pay | Admitting: Physician Assistant

## 2017-05-07 ENCOUNTER — Ambulatory Visit (INDEPENDENT_AMBULATORY_CARE_PROVIDER_SITE_OTHER): Payer: Medicare Other

## 2017-05-07 ENCOUNTER — Encounter: Payer: Self-pay | Admitting: Physician Assistant

## 2017-05-07 VITALS — BP 113/61 | HR 63 | Temp 97.9°F | Resp 20 | Ht 66.0 in | Wt 180.6 lb

## 2017-05-07 DIAGNOSIS — T82898A Other specified complication of vascular prosthetic devices, implants and grafts, initial encounter: Secondary | ICD-10-CM

## 2017-05-07 DIAGNOSIS — R0989 Other specified symptoms and signs involving the circulatory and respiratory systems: Secondary | ICD-10-CM | POA: Diagnosis not present

## 2017-05-07 LAB — VAS US CAROTID
LEFT VERTEBRAL DIAS: 9 cm/s
LICADDIAS: -15 cm/s
LICAPSYS: 234 cm/s
Left CCA dist dias: -16 cm/s
Left CCA dist sys: -176 cm/s
Left CCA prox dias: 13 cm/s
Left CCA prox sys: 117 cm/s
Left ICA dist sys: -84 cm/s
Left ICA prox dias: 51 cm/s
RIGHT VERTEBRAL DIAS: 15 cm/s
Right CCA prox sys: -102 cm/s
Right cca dist sys: -78 cm/s

## 2017-05-07 NOTE — Progress Notes (Signed)
POST OPERATIVE OFFICE NOTE    CC:  F/u for surgery  HPI:  This is a 77 y.o. male who is s/p right brachial cephalic AV fistula on 09/23/60 by Dr. Scot Dock.  At the completion of the surgery, he had a radial and ulnar signal at the wrist.   He comes back today with complaints of numbness in his right 1st and 2nd fingers and pain in fingers 1-3 as well as the palm of his hand. He complains of his hand being cold and he is weaker in the right hand.  He is right hand dominant.  His daughter states the fistula was placed in the right arm as the right arm had better veins.    He dialyzes T/T/S.    He has a hx of GIB and is on Plavix only at this time for LAD stenting.  He quit smoking last year when he had his heart attack.   The pt is on a statin for cholesterol management.  He is on a beta blocker and CCB for hypertension.    He had carotid duplex study at Dr. Tyrell Antonio office this morning that revealed 40-59% bilateral ICA stenosis.  He has been asymptomatic.    Past Medical History:  Diagnosis Date  . Anemia   . Anginal pain ([place])   . Arthritis    "all over; bad in the legs" (12/04/2016)  . CAD (coronary artery disease)    a. NSTEMI in setting of anemia; b. 10/2016 MV: inflat ST dep with large, reversible ant, apical, inflat defect; c. 10/2016 Cath: LM nl, LAD 40p, 36m, D1 70, D2 80, RI min irregs, LCX 100ost, RCA 100p, 144m/d, RPDA fills via collats from LAD, RPAV small-->Initial conservative Rx in setting of GIB w/ plan for PCI LAD if H/H stable on ASA/Plavix;  d. 12/04/16 s/p PTCA and DES to mLAD.  Marland Kitchen Depression    situational   . Diastolic dysfunction    a. 10/2016 Echo: EF 55-60%, no rwma, Gr2 DD, triv MR, mildly dil LA.  Marland Kitchen ESRD (end stage renal disease) ([place])    a. 10/2016 HD catheter insterted and HD initiated; b. Pending permanent HD access folllowing Cardiac Revascularization.  . ESRD on dialysis [facility])    "[place]; [address]; TTS usually; going to [facility] in [place], [place] right now while  staying w/daughter" (12/04/2016)  . GERD (gastroesophageal reflux disease)   . GIB (gastrointestinal bleeding) 10/2011   a. 10/2016 3u PRBCs - EGD/Colonoscopy: angiodysplasia w/ diverticulosis. No apparent bleeding. Polypecotmy->tubular adenomas.  . Gout   . Heart murmur   . High cholesterol   . History of blood transfusion 10/18/2016   "related to anemia" (12/04/2016)  . Hypertension   . Iron deficiency anemia   . Myocardial infarction ([place]) 10/18/2016  . Old MI (myocardial infarction)    "found evidence of this on 10/18/2016"  . PAF (paroxysmal atrial fibrillation) ([place])   . Peripheral vascular disease ([place])    BLE  . Prostate cancer ([place])    a. s/p seed implantation.  . Tobacco abuse     No Known Allergies  Current Outpatient Medications  Medication Sig Dispense Refill  . allopurinol (ZYLOPRIM) 100 MG tablet Take 1 tablet (100 mg total) by mouth daily. 30 tablet 0  . amLODipine (NORVASC) 10 MG tablet Take 1 tablet (10 mg total) by mouth daily. 90 tablet 3  . atorvastatin (LIPITOR) 80 MG tablet Take 1 tablet (80 mg total) by mouth daily at 6 PM. 90 tablet 3  .  b complex vitamins tablet Take 1 tablet by mouth at bedtime. (2300)    . calcitRIOL (ROCALTROL) 0.25 MCG capsule Take 1 capsule (0.25 mcg total) by mouth Every Tuesday,Thursday,and Saturday with dialysis. (Patient taking differently: Take 0.25 mcg by mouth See admin instructions. Take 0.25 mcg by mouth daily on Tuesday and Saturday) 30 capsule 0  . carvedilol (COREG) 6.25 MG tablet Take 1 tablet (6.25 mg total) by mouth 2 (two) times daily with a meal. 180 tablet 3  . clopidogrel (PLAVIX) 75 MG tablet Take 1 tablet (75 mg total) by mouth daily. 90 tablet 3  . isosorbide mononitrate (IMDUR) 30 MG 24 hr tablet Take 30 mg by mouth daily.    Marland Kitchen oxyCODONE-acetaminophen (ROXICET) 5-325 MG tablet Take 1-2 tablets by mouth every 4 (four) hours as needed. 20 tablet 0  . ranitidine (ZANTAC) 150 MG tablet Take 150 mg by mouth daily as  needed for heartburn.      No current facility-administered medications for this visit.    Family History  Problem Relation Age of Onset  . Cancer Mother   . Cancer Father   . Heart attack Father   . Diabetes Father   . Hypertension Father   . Cancer Sister   . Cancer Brother   . Diabetes Brother   . Hypertension Brother   . Hypertension Daughter    Social History   Socioeconomic History  . Marital status: Widowed    Spouse name: Not on file  . Number of children: Not on file  . Years of education: Not on file  . Highest education level: Not on file  Social Needs  . Financial resource strain: Not on file  . Food insecurity - worry: Not on file  . Food insecurity - inability: Not on file  . Transportation needs - medical: Not on file  . Transportation needs - non-medical: Not on file  Occupational History  . Not on file  Tobacco Use  . Smoking status: Former Smoker    Packs/day: 0.50    Years: 50.00    Pack years: 25.00    Types: Cigarettes    Last attempt to quit: 10/18/2016    Years since quitting: 0.5  . Smokeless tobacco: Never Used  Substance and Sexual Activity  . Alcohol use: No  . Drug use: No  . Sexual activity: No    Birth control/protection: Abstinence  Other Topics Concern  . Not on file  Social History Narrative  . Not on file     ROS:  See HPI  Physical Exam:  Vitals:   05/07/17 1307  BP: 113/61  Pulse: 63  Resp: 20  Temp: 97.9 F (36.6 C)  SpO2: 98%   Vitals:   05/07/17 1307  Weight: 180 lb 9.6 oz (81.9 kg)  Height: 5\' 6"  (1.676 m)   Body mass index is 29.15 kg/m.  General:  No distress Lungs:  Non labored Cardiac:  regular Incision:  Well healed Extremities:  Right hand grip is weaker than the left and right hand is cooler than the left.  Monophasic palmar arch signal.  Right radial artery doppler signal is absent and right ulnar artery is weak but augments with compression of the fistula.  Excellent thrill within the fistula  and is maturing nicely.     Assessment/Plan:  This is a 77 y.o. male who is s/p: Right brachial cephalic AV fistula on 8/36/62 by Dr. Scot Dock  -pt is having significant steal symptoms from his right arm AVF  and he is right hand dominant.  He is having numbness, coolness, pain and weakness of his right hand.  His radial and ulnar doppler signals augment with compression of the fistula.   -Dr. Scot Dock examined the pt and discussed with the pt to watch the fistula and see if this gets better, but he was not in favor of this given his sx.  Second option would be to ligate fistula and start over, but overall favored banding the fistula to preserve the fistula and help improve his sx.  This was discussed with the pt and his daughter and surgery is scheduled for May 19, 2017 by Dr. Scot Dock. -the pt is on Plavix only and may continue this. -pt did have carotid duplex with Dr. Fletcher Anon this morning that revealed 40-59% bilateral carotid artery stenosis-discussed symptoms with pt and he is asymptomatic.  Discussed with pt and daughter that should he develop symptoms, he should call 911 or head to the ER.     Leontine Locket, PA-C [facility] [phone]  Clinic MD:  Scot Dock

## 2017-05-07 NOTE — H&P (View-Only) (Signed)
POST OPERATIVE OFFICE NOTE    CC:  F/u for surgery  HPI:  This is a 77 y.o. male who is s/p right brachial cephalic AV fistula on 9/93/57 by Dr. Scot Dock.  At the completion of the surgery, he had a radial and ulnar signal at the wrist.   He comes back today with complaints of numbness in his right 1st and 2nd fingers and pain in fingers 1-3 as well as the palm of his hand. He complains of his hand being cold and he is weaker in the right hand.  He is right hand dominant.  His daughter states the fistula was placed in the right arm as the right arm had better veins.    He dialyzes T/T/S.    He has a hx of GIB and is on Plavix only at this time for LAD stenting.  He quit smoking last year when he had his heart attack.   The pt is on a statin for cholesterol management.  He is on a beta blocker and CCB for hypertension.    He had carotid duplex study at Dr. Tyrell Antonio office this morning that revealed 40-59% bilateral ICA stenosis.  He has been asymptomatic.    Past Medical History:  Diagnosis Date  . Anemia   . Anginal pain ([place])   . Arthritis    "all over; bad in the legs" (12/04/2016)  . CAD (coronary artery disease)    a. NSTEMI in setting of anemia; b. 10/2016 MV: inflat ST dep with large, reversible ant, apical, inflat defect; c. 10/2016 Cath: LM nl, LAD 40p, 33m, D1 70, D2 80, RI min irregs, LCX 100ost, RCA 100p, 134m/d, RPDA fills via collats from LAD, RPAV small-->Initial conservative Rx in setting of GIB w/ plan for PCI LAD if H/H stable on ASA/Plavix;  d. 12/04/16 s/p PTCA and DES to mLAD.  Marland Kitchen Depression    situational   . Diastolic dysfunction    a. 10/2016 Echo: EF 55-60%, no rwma, Gr2 DD, triv MR, mildly dil LA.  Marland Kitchen ESRD (end stage renal disease) ([place])    a. 10/2016 HD catheter insterted and HD initiated; b. Pending permanent HD access folllowing Cardiac Revascularization.  . ESRD on dialysis [facility])    "[place]; [address]; TTS usually; going to [facility] in [place], [place] right now while  staying w/daughter" (12/04/2016)  . GERD (gastroesophageal reflux disease)   . GIB (gastrointestinal bleeding) 10/2011   a. 10/2016 3u PRBCs - EGD/Colonoscopy: angiodysplasia w/ diverticulosis. No apparent bleeding. Polypecotmy->tubular adenomas.  . Gout   . Heart murmur   . High cholesterol   . History of blood transfusion 10/18/2016   "related to anemia" (12/04/2016)  . Hypertension   . Iron deficiency anemia   . Myocardial infarction ([place]) 10/18/2016  . Old MI (myocardial infarction)    "found evidence of this on 10/18/2016"  . PAF (paroxysmal atrial fibrillation) ([place])   . Peripheral vascular disease ([place])    BLE  . Prostate cancer ([place])    a. s/p seed implantation.  . Tobacco abuse     No Known Allergies  Current Outpatient Medications  Medication Sig Dispense Refill  . allopurinol (ZYLOPRIM) 100 MG tablet Take 1 tablet (100 mg total) by mouth daily. 30 tablet 0  . amLODipine (NORVASC) 10 MG tablet Take 1 tablet (10 mg total) by mouth daily. 90 tablet 3  . atorvastatin (LIPITOR) 80 MG tablet Take 1 tablet (80 mg total) by mouth daily at 6 PM. 90 tablet 3  .  b complex vitamins tablet Take 1 tablet by mouth at bedtime. (2300)    . calcitRIOL (ROCALTROL) 0.25 MCG capsule Take 1 capsule (0.25 mcg total) by mouth Every Tuesday,Thursday,and Saturday with dialysis. (Patient taking differently: Take 0.25 mcg by mouth See admin instructions. Take 0.25 mcg by mouth daily on Tuesday and Saturday) 30 capsule 0  . carvedilol (COREG) 6.25 MG tablet Take 1 tablet (6.25 mg total) by mouth 2 (two) times daily with a meal. 180 tablet 3  . clopidogrel (PLAVIX) 75 MG tablet Take 1 tablet (75 mg total) by mouth daily. 90 tablet 3  . isosorbide mononitrate (IMDUR) 30 MG 24 hr tablet Take 30 mg by mouth daily.    Marland Kitchen oxyCODONE-acetaminophen (ROXICET) 5-325 MG tablet Take 1-2 tablets by mouth every 4 (four) hours as needed. 20 tablet 0  . ranitidine (ZANTAC) 150 MG tablet Take 150 mg by mouth daily as  needed for heartburn.      No current facility-administered medications for this visit.    Family History  Problem Relation Age of Onset  . Cancer Mother   . Cancer Father   . Heart attack Father   . Diabetes Father   . Hypertension Father   . Cancer Sister   . Cancer Brother   . Diabetes Brother   . Hypertension Brother   . Hypertension Daughter    Social History   Socioeconomic History  . Marital status: Widowed    Spouse name: Not on file  . Number of children: Not on file  . Years of education: Not on file  . Highest education level: Not on file  Social Needs  . Financial resource strain: Not on file  . Food insecurity - worry: Not on file  . Food insecurity - inability: Not on file  . Transportation needs - medical: Not on file  . Transportation needs - non-medical: Not on file  Occupational History  . Not on file  Tobacco Use  . Smoking status: Former Smoker    Packs/day: 0.50    Years: 50.00    Pack years: 25.00    Types: Cigarettes    Last attempt to quit: 10/18/2016    Years since quitting: 0.5  . Smokeless tobacco: Never Used  Substance and Sexual Activity  . Alcohol use: No  . Drug use: No  . Sexual activity: No    Birth control/protection: Abstinence  Other Topics Concern  . Not on file  Social History Narrative  . Not on file     ROS:  See HPI  Physical Exam:  Vitals:   05/07/17 1307  BP: 113/61  Pulse: 63  Resp: 20  Temp: 97.9 F (36.6 C)  SpO2: 98%   Vitals:   05/07/17 1307  Weight: 180 lb 9.6 oz (81.9 kg)  Height: 5\' 6"  (1.676 m)   Body mass index is 29.15 kg/m.  General:  No distress Lungs:  Non labored Cardiac:  regular Incision:  Well healed Extremities:  Right hand grip is weaker than the left and right hand is cooler than the left.  Monophasic palmar arch signal.  Right radial artery doppler signal is absent and right ulnar artery is weak but augments with compression of the fistula.  Excellent thrill within the fistula  and is maturing nicely.     Assessment/Plan:  This is a 77 y.o. male who is s/p: Right brachial cephalic AV fistula on 8/65/78 by Dr. Scot Dock  -pt is having significant steal symptoms from his right arm AVF  and he is right hand dominant.  He is having numbness, coolness, pain and weakness of his right hand.  His radial and ulnar doppler signals augment with compression of the fistula.   -Dr. Scot Dock examined the pt and discussed with the pt to watch the fistula and see if this gets better, but he was not in favor of this given his sx.  Second option would be to ligate fistula and start over, but overall favored banding the fistula to preserve the fistula and help improve his sx.  This was discussed with the pt and his daughter and surgery is scheduled for May 19, 2017 by Dr. Scot Dock. -the pt is on Plavix only and may continue this. -pt did have carotid duplex with Dr. Fletcher Anon this morning that revealed 40-59% bilateral carotid artery stenosis-discussed symptoms with pt and he is asymptomatic.  Discussed with pt and daughter that should he develop symptoms, he should call 911 or head to the ER.     Leontine Locket, PA-C [facility] [phone]  Clinic MD:  Scot Dock

## 2017-05-08 ENCOUNTER — Other Ambulatory Visit: Payer: Self-pay | Admitting: *Deleted

## 2017-05-08 DIAGNOSIS — N186 End stage renal disease: Secondary | ICD-10-CM | POA: Diagnosis not present

## 2017-05-08 DIAGNOSIS — D509 Iron deficiency anemia, unspecified: Secondary | ICD-10-CM | POA: Diagnosis not present

## 2017-05-08 DIAGNOSIS — I739 Peripheral vascular disease, unspecified: Principal | ICD-10-CM

## 2017-05-08 DIAGNOSIS — D631 Anemia in chronic kidney disease: Secondary | ICD-10-CM | POA: Diagnosis not present

## 2017-05-08 DIAGNOSIS — D689 Coagulation defect, unspecified: Secondary | ICD-10-CM | POA: Diagnosis not present

## 2017-05-08 DIAGNOSIS — I779 Disorder of arteries and arterioles, unspecified: Secondary | ICD-10-CM

## 2017-05-08 DIAGNOSIS — N2581 Secondary hyperparathyroidism of renal origin: Secondary | ICD-10-CM | POA: Diagnosis not present

## 2017-05-09 ENCOUNTER — Other Ambulatory Visit: Payer: Self-pay

## 2017-05-09 ENCOUNTER — Encounter (HOSPITAL_COMMUNITY): Payer: Self-pay | Admitting: *Deleted

## 2017-05-09 NOTE — Progress Notes (Addendum)
Pt SDW-Pre-op call completed by both pt daughter [name] ( per verbal request by pt). Pt denies any acute cardiopulmonary issues. Pt under the care of Dr. Fletcher Anon, Cardiology. [name] made aware to have pt stop taking otc vitamins, fish oil and herbal medications.  Do not take any NSAIDs ie: Ibuprofen, Advil, Naproxen (Aleve), Motrin, BC and Goody Powder. [name] verbalized understanding of all pre-op instructions. Kay, Surgical Coordinator, stated that pt should not take Plavix morning of procedure.

## 2017-05-10 DIAGNOSIS — D689 Coagulation defect, unspecified: Secondary | ICD-10-CM | POA: Diagnosis not present

## 2017-05-10 DIAGNOSIS — N2581 Secondary hyperparathyroidism of renal origin: Secondary | ICD-10-CM | POA: Diagnosis not present

## 2017-05-10 DIAGNOSIS — N186 End stage renal disease: Secondary | ICD-10-CM | POA: Diagnosis not present

## 2017-05-10 DIAGNOSIS — D509 Iron deficiency anemia, unspecified: Secondary | ICD-10-CM | POA: Diagnosis not present

## 2017-05-10 DIAGNOSIS — D631 Anemia in chronic kidney disease: Secondary | ICD-10-CM | POA: Diagnosis not present

## 2017-05-11 NOTE — Anesthesia Preprocedure Evaluation (Addendum)
Anesthesia Evaluation  Patient identified by MRN, date of birth, ID band Patient awake    Reviewed: Allergy & Precautions, NPO status , Patient's Chart, lab work & pertinent test results, reviewed documented beta blocker date and time   History of Anesthesia Complications Negative for: history of anesthetic complications  Airway Mallampati: I  TM Distance: >3 FB Neck ROM: Full    Dental  (+) Poor Dentition, Missing   Pulmonary neg shortness of breath, neg sleep apnea, neg COPD, neg recent URI, former smoker,    breath sounds clear to auscultation       Cardiovascular hypertension, Pt. on medications and Pt. on home beta blockers + angina + CAD, + Past MI, + Cardiac Stents and + Peripheral Vascular Disease   Rhythm:Regular     Neuro/Psych PSYCHIATRIC DISORDERS Depression negative neurological ROS     GI/Hepatic Neg liver ROS, GERD  Medicated,  Endo/Other  negative endocrine ROS  Renal/GU ESRF and DialysisRenal disease     Musculoskeletal  (+) Arthritis ,   Abdominal   Peds  Hematology  (+) anemia ,   Anesthesia Other Findings 11/2016  Ost Cx to Prox Cx lesion, 100 %stenosed.  Prox RCA lesion, 100 %stenosed.  Prox LAD lesion, 40 %stenosed.  Patient is a 77 year old male scheduled for creation of right brachiocephalic AV fistula on 16/09/3708 by Dr. [name].  History includes ESRD (HD TTS via TDC, placed 10/22/16), former smoker (quit 10/18/16), prostate cancer (s/p radioactive seed implant), GERD, PAF, CAD/angina/MI (DES mid LAD 11/2016; ASA discontinued 12/2016 due to recurrent GI bleed, but Plavix continued), murmur, diastolic dysfunction, GI bleed '13, HTN, hypercholesterolemia, PVD, anemia.  - Nephrologist is Dr. [name]. - Cardiologist is DR. [name]. Last visit with [name], NP on 02/01/17. Patient had DES 9/29018 (on Plavix only due to GI bleed). Patient was concerned that he  was having belching and hiccups during dialysis. However there was no exertional symptoms. Simethicone was recommended. No cardiac testing was ordered. 3 month f/u planned.   Meds include allopurinol, amlodipine, Lipitor, calcitriol, Coreg, Plavix, hydralazine, Imdur, Zantac. Per [facility] RN [name], PATIENT IS TO REMAIN ON PLAVIX.  EKG 12/20/16: SR with first degree AV block, cannot rule out anterior infarct, age undetermined.  Cardiac cath 12/04/16:  Colon [place]attery Cx to Prox Cx lesion, 100 %stenosed.  Prox RCA lesion, 100 %stenosed.  Prox LAD lesion, 40 %stenosed.  Ost 1st Diag to 1st Diag lesion, 70 %stenosed.  Mid RCA to Dist RCA lesion, 100 %stenosed.  Mid LAD lesion, 80 %stenosed.  Post intervention, there is a 0% residual stenosis.  A stent was successfully placed.  Ost 2nd Diag to 2nd Diag lesion, 60 %stenosed. Successful angioplasty and drug-eluting stent placement to the mid LAD. Recommendations: Dual antiplatelet therapy for at least 6 months. Aggressive treatment of risk factors. The patient's dialysis schedule is Tuesdays, Thursdays and Saturdays.  The patient seems to have significant disease affecting the right common femoral artery. Recommend an outpatient lower extremity arterial Doppler. (Previous LHC 10/23/16 showed significant three-vessel coronary artery disease. Chronically occluded right coronary artery with left to right collaterals supplying the right PDA. Chronically occluded proximal left circumflex which seems to be a small vessel overall with some left to left collaterals. Most of the lateral wall seems to be supplied by a large ramus branch. The LAD has a discrete 70% stenosis in a tortuous midsegment . The coronary arteries are overall moderately calcified and extremely tortuous likely due to hypertensive heart disease. There was not  felt to be any significant advantage to CABG given non-critical LAD disease and only other graftable vessel would be RPDA. Delayed PCI LAD  planned once recovered for GI bleed.)   Echo 10/20/16: Study Conclusions - Left ventricle: The cavity size was normal. There was mild focal basal hypertrophy of the septum. Systolic function was normal. The estimated ejection fraction was in the range of 55% to 60%. Wall motion was normal; there were no regional wall motion abnormalities. Features are consistent with a pseudonormal left ventricular filling pattern, with concomitant abnormal relaxation and increased filling pressure (grade 2 diastolic dysfunction). Doppler parameters are consistent with high ventricular filling pressure. - Aortic valve: Transvalvular velocity was within the normal range. There was no stenosis. There was no regurgitation. - Mitral valve: Transvalvular velocity was within the normal range. There was no evidence for stenosis. There was trivial regurgitation. - Left atrium: The atrium was mildly dilated. - Right ventricle: The cavity size was normal. Wall thickness was normal. Systolic function was normal. - Tricuspid valve: There was no regurgitation.  1V CXR 10/22/16: IMPRESSION: Dialysis catheter tip in the upper right atrium region. Negative for pneumothorax. Central vascular congestion or mild edema.    Reproductive/Obstetrics                            Anesthesia Physical  Anesthesia Plan  ASA: III  Anesthesia Plan: MAC   Post-op Pain Management:    Induction: Intravenous  PONV Risk Score and Plan: 1 and Ondansetron  Airway Management Planned: Nasal Cannula  Additional Equipment:   Intra-op Plan:   Post-operative Plan:   Informed Consent: I have reviewed the patients History and Physical, chart, labs and discussed the procedure including the risks, benefits and alternatives for the proposed anesthesia with the patient or authorized representative who has indicated his/her understanding and acceptance.   Dental advisory given  Plan  Discussed with: Surgeon, CRNA and Anesthesiologist  Anesthesia Plan Comments:         Anesthesia Quick Evaluation

## 2017-05-11 NOTE — Anesthesia Preprocedure Evaluation (Deleted)
Anesthesia Evaluation    Airway       Dental   Pulmonary former smoker,          Cardiovascular hypertension,     Neuro/Psych    GI/Hepatic   Endo/Other    Renal/GU      Musculoskeletal   Abdominal   Peds  Hematology   Anesthesia Other Findings   Reproductive/Obstetrics                          Anesthesia Physical Anesthesia Plan Anesthesia Quick Evaluation  

## 2017-05-12 ENCOUNTER — Other Ambulatory Visit: Payer: Self-pay

## 2017-05-12 ENCOUNTER — Encounter (HOSPITAL_COMMUNITY): Payer: Self-pay | Admitting: *Deleted

## 2017-05-12 ENCOUNTER — Telehealth: Payer: Self-pay | Admitting: Vascular Surgery

## 2017-05-12 ENCOUNTER — Ambulatory Visit (HOSPITAL_COMMUNITY)
Admission: RE | Admit: 2017-05-12 | Discharge: 2017-05-12 | Disposition: A | Discharge status: Home / Self Care | Payer: Medicare Other | Source: Ambulatory Visit | Attending: Vascular Surgery | Admitting: Vascular Surgery

## 2017-05-12 ENCOUNTER — Encounter (HOSPITAL_COMMUNITY)
Admission: RE | Disposition: A | Discharge status: Home / Self Care | Payer: Self-pay | Source: Ambulatory Visit | Attending: Vascular Surgery

## 2017-05-12 ENCOUNTER — Ambulatory Visit (HOSPITAL_COMMUNITY): Payer: Medicare Other | Admitting: Anesthesiology

## 2017-05-12 DIAGNOSIS — N186 End stage renal disease: Secondary | ICD-10-CM | POA: Insufficient documentation

## 2017-05-12 DIAGNOSIS — Z87891 Personal history of nicotine dependence: Secondary | ICD-10-CM | POA: Diagnosis not present

## 2017-05-12 DIAGNOSIS — I480 Paroxysmal atrial fibrillation: Secondary | ICD-10-CM | POA: Diagnosis not present

## 2017-05-12 DIAGNOSIS — M109 Gout, unspecified: Secondary | ICD-10-CM | POA: Diagnosis not present

## 2017-05-12 DIAGNOSIS — K219 Gastro-esophageal reflux disease without esophagitis: Secondary | ICD-10-CM | POA: Insufficient documentation

## 2017-05-12 DIAGNOSIS — I252 Old myocardial infarction: Secondary | ICD-10-CM | POA: Insufficient documentation

## 2017-05-12 DIAGNOSIS — E7800 Pure hypercholesterolemia, unspecified: Secondary | ICD-10-CM | POA: Insufficient documentation

## 2017-05-12 DIAGNOSIS — F329 Major depressive disorder, single episode, unspecified: Secondary | ICD-10-CM | POA: Insufficient documentation

## 2017-05-12 DIAGNOSIS — T82898A Other specified complication of vascular prosthetic devices, implants and grafts, initial encounter: Secondary | ICD-10-CM | POA: Diagnosis not present

## 2017-05-12 DIAGNOSIS — E875 Hyperkalemia: Secondary | ICD-10-CM | POA: Diagnosis not present

## 2017-05-12 DIAGNOSIS — I120 Hypertensive chronic kidney disease with stage 5 chronic kidney disease or end stage renal disease: Secondary | ICD-10-CM | POA: Insufficient documentation

## 2017-05-12 DIAGNOSIS — Z992 Dependence on renal dialysis: Secondary | ICD-10-CM | POA: Diagnosis not present

## 2017-05-12 DIAGNOSIS — Y832 Surgical operation with anastomosis, bypass or graft as the cause of abnormal reaction of the patient, or of later complication, without mention of misadventure at the time of the procedure: Secondary | ICD-10-CM | POA: Insufficient documentation

## 2017-05-12 DIAGNOSIS — Z955 Presence of coronary angioplasty implant and graft: Secondary | ICD-10-CM | POA: Insufficient documentation

## 2017-05-12 DIAGNOSIS — I739 Peripheral vascular disease, unspecified: Secondary | ICD-10-CM | POA: Insufficient documentation

## 2017-05-12 DIAGNOSIS — I6523 Occlusion and stenosis of bilateral carotid arteries: Secondary | ICD-10-CM | POA: Insufficient documentation

## 2017-05-12 DIAGNOSIS — Z7902 Long term (current) use of antithrombotics/antiplatelets: Secondary | ICD-10-CM | POA: Insufficient documentation

## 2017-05-12 DIAGNOSIS — I2510 Atherosclerotic heart disease of native coronary artery without angina pectoris: Secondary | ICD-10-CM | POA: Diagnosis not present

## 2017-05-12 DIAGNOSIS — T82510A Breakdown (mechanical) of surgically created arteriovenous fistula, initial encounter: Secondary | ICD-10-CM | POA: Diagnosis not present

## 2017-05-12 HISTORY — PX: LIGATION OF ARTERIOVENOUS  FISTULA: SHX5948

## 2017-05-12 LAB — POCT I-STAT 4, (NA,K, GLUC, HGB,HCT)
Glucose, Bld: 100 mg/dL — ABNORMAL HIGH (ref 65–99)
HEMATOCRIT: 37 % — AB (ref 39.0–52.0)
Hemoglobin: 12.6 g/dL — ABNORMAL LOW (ref 13.0–17.0)
Potassium: 4.1 mmol/L (ref 3.5–5.1)
Sodium: 138 mmol/L (ref 135–145)

## 2017-05-12 SURGERY — LIGATION OF ARTERIOVENOUS  FISTULA
Anesthesia: Monitor Anesthesia Care | Site: Arm Upper | Laterality: Right

## 2017-05-12 MED ORDER — EPHEDRINE SULFATE 50 MG/ML IJ SOLN
INTRAMUSCULAR | Status: DC | PRN
  Administered 2017-05-12: 5 mg via INTRAVENOUS
  Administered 2017-05-12: 7.5 mg via INTRAVENOUS

## 2017-05-12 MED ORDER — FENTANYL CITRATE (PF) 250 MCG/5ML IJ SOLN
INTRAMUSCULAR | Status: DC | PRN
  Administered 2017-05-12: 50 ug via INTRAVENOUS

## 2017-05-12 MED ORDER — FENTANYL CITRATE (PF) 250 MCG/5ML IJ SOLN
INTRAMUSCULAR | Status: AC
  Filled 2017-05-12: qty 5

## 2017-05-12 MED ORDER — LIDOCAINE-EPINEPHRINE (PF) 1 %-1:200000 IJ SOLN
INTRAMUSCULAR | Status: DC | PRN
  Administered 2017-05-12: 30 mL

## 2017-05-12 MED ORDER — SODIUM CHLORIDE 0.9 % IV SOLN
1.5000 g | INTRAVENOUS | Status: AC
  Administered 2017-05-12: 1.5 g via INTRAVENOUS
  Filled 2017-05-12: qty 1.5

## 2017-05-12 MED ORDER — DIPHENHYDRAMINE HCL 50 MG/ML IJ SOLN
INTRAMUSCULAR | Status: DC | PRN
  Administered 2017-05-12: 12.5 mg via INTRAVENOUS

## 2017-05-12 MED ORDER — MEPERIDINE HCL 50 MG/ML IJ SOLN: 6.2500 mg | INTRAMUSCULAR | Status: DC | PRN

## 2017-05-12 MED ORDER — DEXMEDETOMIDINE HCL IN NACL 200 MCG/50ML IV SOLN
INTRAVENOUS | Status: AC
  Filled 2017-05-12: qty 50

## 2017-05-12 MED ORDER — PHENYLEPHRINE HCL 10 MG/ML IJ SOLN
INTRAMUSCULAR | Status: DC | PRN
  Administered 2017-05-12: 80 ug via INTRAVENOUS
  Administered 2017-05-12: 40 ug via INTRAVENOUS
  Administered 2017-05-12: 80 ug via INTRAVENOUS
  Administered 2017-05-12: 40 ug via INTRAVENOUS
  Administered 2017-05-12: 80 ug via INTRAVENOUS

## 2017-05-12 MED ORDER — PROPOFOL 10 MG/ML IV BOLUS
INTRAVENOUS | Status: AC
  Filled 2017-05-12: qty 20

## 2017-05-12 MED ORDER — ONDANSETRON HCL 4 MG/2ML IJ SOLN
INTRAMUSCULAR | Status: DC | PRN
  Administered 2017-05-12: 4 mg via INTRAVENOUS

## 2017-05-12 MED ORDER — SODIUM CHLORIDE 0.9 % IV SOLN
INTRAVENOUS | Status: DC
  Administered 2017-05-12: 07:00:00 via INTRAVENOUS

## 2017-05-12 MED ORDER — SODIUM CHLORIDE 0.9 % IV SOLN
INTRAVENOUS | Status: AC
  Filled 2017-05-12: qty 1.5

## 2017-05-12 MED ORDER — FENTANYL CITRATE (PF) 100 MCG/2ML IJ SOLN: 25.0000 ug | INTRAMUSCULAR | Status: DC | PRN

## 2017-05-12 MED ORDER — 0.9 % SODIUM CHLORIDE (POUR BTL) OPTIME
TOPICAL | Status: DC | PRN
  Administered 2017-05-12: 1000 mL

## 2017-05-12 MED ORDER — PROPOFOL 500 MG/50ML IV EMUL
INTRAVENOUS | Status: DC | PRN
  Administered 2017-05-12: 75 ug/kg/min via INTRAVENOUS

## 2017-05-12 MED ORDER — LIDOCAINE HCL (CARDIAC) 20 MG/ML IV SOLN
INTRAVENOUS | Status: DC | PRN
  Administered 2017-05-12: 40 mg via INTRATRACHEAL

## 2017-05-12 MED ORDER — HEPARIN SODIUM (PORCINE) 1000 UNIT/ML IJ SOLN
INTRAMUSCULAR | Status: AC
  Filled 2017-05-12: qty 1

## 2017-05-12 MED ORDER — LIDOCAINE-EPINEPHRINE (PF) 1 %-1:200000 IJ SOLN
INTRAMUSCULAR | Status: AC
  Filled 2017-05-12: qty 30

## 2017-05-12 MED ORDER — DEXMEDETOMIDINE HCL 200 MCG/2ML IV SOLN
INTRAVENOUS | Status: DC | PRN
  Administered 2017-05-12: 4 ug via INTRAVENOUS
  Administered 2017-05-12: 8 ug via INTRAVENOUS

## 2017-05-12 MED ORDER — OXYCODONE HCL 5 MG PO TABS: 5.0000 mg | ORAL_TABLET | ORAL | 0 refills | Status: DC | PRN

## 2017-05-12 SURGICAL SUPPLY — 39 items
ADH SKN CLS APL DERMABOND .7 (GAUZE/BANDAGES/DRESSINGS) ×1
CANISTER SUCT 3000ML PPV (MISCELLANEOUS) ×3 IMPLANT
CANNULA VESSEL 3MM 2 BLNT TIP (CANNULA) IMPLANT
DERMABOND ADVANCED (GAUZE/BANDAGES/DRESSINGS) ×2
DERMABOND ADVANCED .7 DNX12 (GAUZE/BANDAGES/DRESSINGS) ×1 IMPLANT
ELECT REM PT RETURN 9FT ADLT (ELECTROSURGICAL) ×3
ELECTRODE REM PT RTRN 9FT ADLT (ELECTROSURGICAL) ×1 IMPLANT
GLOVE BIO SURGEON STRL SZ 6.5 (GLOVE) ×1 IMPLANT
GLOVE BIO SURGEON STRL SZ7.5 (GLOVE) ×3 IMPLANT
GLOVE BIO SURGEONS STRL SZ 6.5 (GLOVE) ×1
GLOVE BIOGEL PI IND STRL 6.5 (GLOVE) IMPLANT
GLOVE BIOGEL PI IND STRL 7.0 (GLOVE) IMPLANT
GLOVE BIOGEL PI IND STRL 7.5 (GLOVE) IMPLANT
GLOVE BIOGEL PI IND STRL 8 (GLOVE) ×1 IMPLANT
GLOVE BIOGEL PI INDICATOR 6.5 (GLOVE) ×2
GLOVE BIOGEL PI INDICATOR 7.0 (GLOVE) ×2
GLOVE BIOGEL PI INDICATOR 7.5 (GLOVE) ×2
GLOVE BIOGEL PI INDICATOR 8 (GLOVE) ×4
GLOVE ECLIPSE 7.0 STRL STRAW (GLOVE) ×2 IMPLANT
GOWN STRL REUS W/ TWL LRG LVL3 (GOWN DISPOSABLE) ×3 IMPLANT
GOWN STRL REUS W/TWL LRG LVL3 (GOWN DISPOSABLE) ×9
GRAFT GORETEX 6X10 (Vascular Products) ×2 IMPLANT
KIT BASIN OR (CUSTOM PROCEDURE TRAY) ×3 IMPLANT
KIT ROOM TURNOVER OR (KITS) ×3 IMPLANT
NDL HYPO 25GX1X1/2 BEV (NEEDLE) IMPLANT
NEEDLE HYPO 25GX1X1/2 BEV (NEEDLE) ×3 IMPLANT
NS IRRIG 1000ML POUR BTL (IV SOLUTION) ×3 IMPLANT
PACK CV ACCESS (CUSTOM PROCEDURE TRAY) ×3 IMPLANT
PAD ARMBOARD 7.5X6 YLW CONV (MISCELLANEOUS) ×6 IMPLANT
SPONGE SURGIFOAM ABS GEL 100 (HEMOSTASIS) IMPLANT
SUT ETHILON 3 0 PS 1 (SUTURE) IMPLANT
SUT PROLENE 6 0 BV (SUTURE) ×4 IMPLANT
SUT SILK 0 TIES 10X30 (SUTURE) ×1 IMPLANT
SUT VIC AB 3-0 SH 27 (SUTURE) ×3
SUT VIC AB 3-0 SH 27X BRD (SUTURE) ×1 IMPLANT
SUT VICRYL 4-0 PS2 18IN ABS (SUTURE) ×3 IMPLANT
TOWEL GREEN STERILE (TOWEL DISPOSABLE) ×3 IMPLANT
UNDERPAD 30X30 (UNDERPADS AND DIAPERS) ×3 IMPLANT
WATER STERILE IRR 1000ML POUR (IV SOLUTION) ×3 IMPLANT

## 2017-05-12 NOTE — Transfer of Care (Signed)
Immediate Anesthesia Transfer of Care Note  Patient: Roger [name]  Procedure(s) Performed: BANDING OF RIGHT UPPER ARM ARTERIOVENOUS  FISTULA USING 6MM X 10CM GORE TEX VASCULAR GRAFT (Right Arm Upper)  Patient Location: PACU  Anesthesia Type:MAC  Level of Consciousness: awake, alert  and oriented  Airway & Oxygen Therapy: Patient Spontanous Breathing and Patient connected to nasal cannula oxygen  Post-op Assessment: Report given to RN, Post -op Vital signs reviewed and stable, Patient moving all extremities and Patient moving all extremities X 4  Post vital signs: Reviewed and stable  Last Vitals:  Vitals:   05/12/17 0606 05/12/17 0851  BP: (!) 118/54 (!) 106/57  Pulse: 64 61  Resp: 18 (!) 21  Temp: 36.8 C (!) 36.3 C  SpO2: 100% 96%    Last Pain:  Vitals:   05/12/17 0642  TempSrc:   PainSc: 10-Worst pain ever      Patients Stated Pain Goal: 3 (54/65/03 5465)  Complications: No apparent anesthesia complications

## 2017-05-12 NOTE — Op Note (Signed)
    NAME: Roger [name]    MRN: [id number] DOB: 03/17/1941    DATE OF OPERATION: 05/12/2017  PREOP DIAGNOSIS:    Steal syndrome right arm  POSTOP DIAGNOSIS:    Same  PROCEDURE:    Banding of right brachiocephalic AV fistula Ligation of competing branch of right brachiocephalic AV fistula  SURGEON: Judeth Cornfield. Scot Dock, MD, FACS  ASSIST: Gerri Lins PA  ANESTHESIA: Local with sedation  EBL: Minimal  INDICATIONS:    Roger [name] is a 77 y.o. male who had a right brachiocephalic fistula placed on 03/28/2017.  He came for in for a follow-up visit on 04/06/2017 with complaints of steal symptoms.  It was felt that the only real chance to salvage the fistula would be banding.  Otherwise he would require ligation.  FINDINGS:   Improved radial and ulnar signal with the Doppler at the completion of the banding and palpable thrill in the graft.  TECHNIQUE:   The patient was taken to the operating room and sedated.  The right upper extremity was prepped and draped in the usual sterile fashion.  After the skin was anesthetized with 1% lidocaine, an oblique incision was made over the proximal fistula and here the fistula was dissected free down to where it was anastomosed to the artery.  There was one large competing branch which was ligated and divided between 2-0 silk ties.  The proximal fistula was aneurysmal.  I selected a 6 mm PTFE graft which was opened longitudinally and then wrapped around the proximal fistula over a length of approximately 3 cm.  I then sewed this together to narrow the fistula.  Interrupted 6-0 Prolene sutures were used.  I narrowed the fistula enough to improve the Doppler signals in the wrist and still maintained flow within the fistula.  Hemostasis was obtained in the wound and the wound was closed with a deep layer of 3-0 Vicryl the skin closed with 4-0 Vicryl.  Dermabond was applied.  The patient tolerated the procedure well and was transferred to the  recovery room in stable condition.  All needle and sponge counts were correct.  [name], MD, FACS [facility]  DATE OF DICTATION:   05/12/2017

## 2017-05-12 NOTE — Anesthesia Procedure Notes (Signed)
Procedure Name: MAC Date/Time: 05/12/2017 7:34 AM Performed by: Mariea Clonts, CRNA Pre-anesthesia Checklist: Patient identified, Emergency Drugs available, Suction available, Patient being monitored and Timeout performed Patient Re-evaluated:Patient Re-evaluated prior to induction Oxygen Delivery Method: Nasal cannula

## 2017-05-12 NOTE — Progress Notes (Signed)
Patient discharged home with daughter. Daughter and other family members will stay with patient for the next 24 hours.

## 2017-05-12 NOTE — Telephone Encounter (Signed)
Sched appt 05/28/17 at 2:00. Lm on hm# to inform pt of appt.

## 2017-05-12 NOTE — Telephone Encounter (Signed)
-----   Message from Mena Goes, RN sent at 05/12/2017  9:45 AM EST ----- Regarding: 2-3 weeks steal follow up   ----- Message ----- From: Angelia Mould, MD Sent: 05/12/2017   8:52 AM To: Vvs Charge Pool Subject: charge                                          PROCEDURE:   Banding of right brachiocephalic AV fistula Ligation of competing branch of right brachiocephalic AV fistula  SURGEON: Judeth Cornfield. Scot Dock, MD, FACS  ASSIST: Gerri Lins PA He will need a follow-up visit with me in 2 weeks to follow-up on his steal symptoms.  Thank you. CD

## 2017-05-12 NOTE — Anesthesia Postprocedure Evaluation (Signed)
Anesthesia Post Note  Patient: Roger [name]  Procedure(s) Performed: BANDING OF RIGHT UPPER ARM ARTERIOVENOUS  FISTULA USING 6MM X 10CM GORE TEX VASCULAR GRAFT (Right Arm Upper)     Patient location during evaluation: PACU Anesthesia Type: MAC Level of consciousness: awake and alert Pain management: pain level controlled Vital Signs Assessment: post-procedure vital signs reviewed and stable Respiratory status: spontaneous breathing, nonlabored ventilation, respiratory function stable and patient connected to nasal cannula oxygen Cardiovascular status: stable and blood pressure returned to baseline Postop Assessment: no apparent nausea or vomiting Anesthetic complications: no    Last Vitals:  Vitals:   05/12/17 0606 05/12/17 0851  BP: (!) 118/54 (!) 106/57  Pulse: 64 61  Resp: 18 (!) 21  Temp: 36.8 C (!) 36.3 C  SpO2: 100% 96%    Last Pain:  Vitals:   05/12/17 0642  TempSrc:   PainSc: 10-Worst pain ever                 Nira Visscher

## 2017-05-12 NOTE — Interval H&P Note (Signed)
History and Physical Interval Note:  05/12/2017 7:23 AM  Roger [name]  has presented today for surgery, with the diagnosis of end stage renal disease  The various methods of treatment have been discussed with the patient and family. After consideration of risks, benefits and other options for treatment, the patient has consented to  Procedure(s): LIGATION BANDING OF ARTERIOVENOUS  FISTULA (Right) as a surgical intervention .  The patient's history has been reviewed, patient examined, no change in status, stable for surgery.  I have reviewed the patient's chart and labs.  Questions were answered to the patient's satisfaction.     [name]

## 2017-05-13 ENCOUNTER — Encounter (HOSPITAL_COMMUNITY): Payer: Self-pay | Admitting: Vascular Surgery

## 2017-05-13 DIAGNOSIS — N186 End stage renal disease: Secondary | ICD-10-CM | POA: Diagnosis not present

## 2017-05-13 DIAGNOSIS — N2581 Secondary hyperparathyroidism of renal origin: Secondary | ICD-10-CM | POA: Diagnosis not present

## 2017-05-13 DIAGNOSIS — D509 Iron deficiency anemia, unspecified: Secondary | ICD-10-CM | POA: Diagnosis not present

## 2017-05-13 DIAGNOSIS — D689 Coagulation defect, unspecified: Secondary | ICD-10-CM | POA: Diagnosis not present

## 2017-05-13 DIAGNOSIS — D631 Anemia in chronic kidney disease: Secondary | ICD-10-CM | POA: Diagnosis not present

## 2017-05-15 DIAGNOSIS — D689 Coagulation defect, unspecified: Secondary | ICD-10-CM | POA: Diagnosis not present

## 2017-05-15 DIAGNOSIS — D509 Iron deficiency anemia, unspecified: Secondary | ICD-10-CM | POA: Diagnosis not present

## 2017-05-15 DIAGNOSIS — D631 Anemia in chronic kidney disease: Secondary | ICD-10-CM | POA: Diagnosis not present

## 2017-05-15 DIAGNOSIS — N2581 Secondary hyperparathyroidism of renal origin: Secondary | ICD-10-CM | POA: Diagnosis not present

## 2017-05-15 DIAGNOSIS — N186 End stage renal disease: Secondary | ICD-10-CM | POA: Diagnosis not present

## 2017-05-16 DIAGNOSIS — I129 Hypertensive chronic kidney disease with stage 1 through stage 4 chronic kidney disease, or unspecified chronic kidney disease: Secondary | ICD-10-CM | POA: Diagnosis not present

## 2017-05-16 DIAGNOSIS — N186 End stage renal disease: Secondary | ICD-10-CM | POA: Diagnosis not present

## 2017-05-16 DIAGNOSIS — Z992 Dependence on renal dialysis: Secondary | ICD-10-CM | POA: Diagnosis not present

## 2017-05-17 DIAGNOSIS — N186 End stage renal disease: Secondary | ICD-10-CM | POA: Diagnosis not present

## 2017-05-17 DIAGNOSIS — D509 Iron deficiency anemia, unspecified: Secondary | ICD-10-CM | POA: Diagnosis not present

## 2017-05-17 DIAGNOSIS — D689 Coagulation defect, unspecified: Secondary | ICD-10-CM | POA: Diagnosis not present

## 2017-05-17 DIAGNOSIS — N2581 Secondary hyperparathyroidism of renal origin: Secondary | ICD-10-CM | POA: Diagnosis not present

## 2017-05-20 DIAGNOSIS — N186 End stage renal disease: Secondary | ICD-10-CM | POA: Diagnosis not present

## 2017-05-20 DIAGNOSIS — N2581 Secondary hyperparathyroidism of renal origin: Secondary | ICD-10-CM | POA: Diagnosis not present

## 2017-05-20 DIAGNOSIS — D689 Coagulation defect, unspecified: Secondary | ICD-10-CM | POA: Diagnosis not present

## 2017-05-20 DIAGNOSIS — D509 Iron deficiency anemia, unspecified: Secondary | ICD-10-CM | POA: Diagnosis not present

## 2017-05-22 DIAGNOSIS — D509 Iron deficiency anemia, unspecified: Secondary | ICD-10-CM | POA: Diagnosis not present

## 2017-05-22 DIAGNOSIS — N186 End stage renal disease: Secondary | ICD-10-CM | POA: Diagnosis not present

## 2017-05-22 DIAGNOSIS — D689 Coagulation defect, unspecified: Secondary | ICD-10-CM | POA: Diagnosis not present

## 2017-05-22 DIAGNOSIS — N2581 Secondary hyperparathyroidism of renal origin: Secondary | ICD-10-CM | POA: Diagnosis not present

## 2017-05-24 DIAGNOSIS — D689 Coagulation defect, unspecified: Secondary | ICD-10-CM | POA: Diagnosis not present

## 2017-05-24 DIAGNOSIS — N2581 Secondary hyperparathyroidism of renal origin: Secondary | ICD-10-CM | POA: Diagnosis not present

## 2017-05-24 DIAGNOSIS — D509 Iron deficiency anemia, unspecified: Secondary | ICD-10-CM | POA: Diagnosis not present

## 2017-05-24 DIAGNOSIS — N186 End stage renal disease: Secondary | ICD-10-CM | POA: Diagnosis not present

## 2017-05-27 DIAGNOSIS — N2581 Secondary hyperparathyroidism of renal origin: Secondary | ICD-10-CM | POA: Diagnosis not present

## 2017-05-27 DIAGNOSIS — D689 Coagulation defect, unspecified: Secondary | ICD-10-CM | POA: Diagnosis not present

## 2017-05-27 DIAGNOSIS — N186 End stage renal disease: Secondary | ICD-10-CM | POA: Diagnosis not present

## 2017-05-27 DIAGNOSIS — D509 Iron deficiency anemia, unspecified: Secondary | ICD-10-CM | POA: Diagnosis not present

## 2017-05-28 ENCOUNTER — Ambulatory Visit (INDEPENDENT_AMBULATORY_CARE_PROVIDER_SITE_OTHER): Payer: Self-pay | Admitting: Physician Assistant

## 2017-05-28 VITALS — BP 115/63 | HR 57 | Temp 97.2°F | Resp 18 | Ht 66.0 in | Wt 179.0 lb

## 2017-05-28 DIAGNOSIS — M79641 Pain in right hand: Secondary | ICD-10-CM

## 2017-05-28 DIAGNOSIS — N185 Chronic kidney disease, stage 5: Secondary | ICD-10-CM

## 2017-05-28 NOTE — Progress Notes (Signed)
    Postoperative Access Visit   History of Present Illness   Roger [name] is a 77 y.o. year old male who presents for postoperative follow-up for: Recheck of steal symptoms of right arm.  Patient is status post right brachiocephalic fistula placement on 03/28/17.  Postoperative visit he complained of steal symptoms of right hand and subsequently underwent banding of right brachiocephalic fistula with ligation of competing branch by Dr. Scot Dock on 05/12/17.  Since surgery 2 weeks prior, patient believes his grip strength and motor function of his right hand has improved drastically however he still complains of some aching of right hand and numbness in the fingertips of last 3 fingers.  The symptoms are made worse during hemodialysis treatments using right IJ tunneled dialysis catheter on a Tuesday Thursday Saturday schedule.  Patient does however state that these symptoms are now tolerable and he would prefer to tolerate the symptoms versus starting over with a new access.   Physical Examination   Vitals:   05/28/17 1404  BP: 115/63  Pulse: (!) 57  Resp: 18  Temp: (!) 97.2 F (36.2 C)  TempSrc: Oral  SpO2: 98%  Weight: 179 lb (81.2 kg)  Height: 5\' 6"  (1.676 m)   Body mass index is 28.89 kg/m.  right arm Incision is well healed, , hand grip is 4/5, mild sensation deficit in tips of fingers 3, 4, 5; fingertips of right hand is slightly cooler than left; palpable thrill, bruit can be auscultated; soft radial and ulnar right wrist by Doppler; radial becomes multiphasic with compression of fistula    Medical Decision Making   Roger [name] is a 77 y.o. year old male who presents s/p banding of right brachiocephalic fistula with ligation of competing branch 2 weeks postoperatively   Despite drastic improvement in symptoms of right hand, patient still has persistent aching and mild sensation deficit s/p banding  I discussed with the patient the risks of proceeding without ligation  of fistula including nerve damage and prolonged ischemia  Patient however states that the symptoms are tolerable and decided against ligation of fistula at this time  We will check a fistula duplex in the next 3 weeks and patient will follow up with Dr. Scot Dock to evaluate steal symptoms as well as readiness of brachiocephalic fistula for dialysis  Patient will call the office if he changes his mind and would like to schedule ligation of fistula   Dagoberto Ligas, PA-C [facility] [place] Office: [phone]

## 2017-05-29 ENCOUNTER — Other Ambulatory Visit: Payer: Self-pay

## 2017-05-29 DIAGNOSIS — D689 Coagulation defect, unspecified: Secondary | ICD-10-CM | POA: Diagnosis not present

## 2017-05-29 DIAGNOSIS — Z992 Dependence on renal dialysis: Principal | ICD-10-CM

## 2017-05-29 DIAGNOSIS — D509 Iron deficiency anemia, unspecified: Secondary | ICD-10-CM | POA: Diagnosis not present

## 2017-05-29 DIAGNOSIS — N2581 Secondary hyperparathyroidism of renal origin: Secondary | ICD-10-CM | POA: Diagnosis not present

## 2017-05-29 DIAGNOSIS — N186 End stage renal disease: Secondary | ICD-10-CM | POA: Diagnosis not present

## 2017-05-31 DIAGNOSIS — N186 End stage renal disease: Secondary | ICD-10-CM | POA: Diagnosis not present

## 2017-05-31 DIAGNOSIS — D689 Coagulation defect, unspecified: Secondary | ICD-10-CM | POA: Diagnosis not present

## 2017-05-31 DIAGNOSIS — D509 Iron deficiency anemia, unspecified: Secondary | ICD-10-CM | POA: Diagnosis not present

## 2017-05-31 DIAGNOSIS — N2581 Secondary hyperparathyroidism of renal origin: Secondary | ICD-10-CM | POA: Diagnosis not present

## 2017-06-03 DIAGNOSIS — D689 Coagulation defect, unspecified: Secondary | ICD-10-CM | POA: Diagnosis not present

## 2017-06-03 DIAGNOSIS — D509 Iron deficiency anemia, unspecified: Secondary | ICD-10-CM | POA: Diagnosis not present

## 2017-06-03 DIAGNOSIS — N2581 Secondary hyperparathyroidism of renal origin: Secondary | ICD-10-CM | POA: Diagnosis not present

## 2017-06-03 DIAGNOSIS — N186 End stage renal disease: Secondary | ICD-10-CM | POA: Diagnosis not present

## 2017-06-05 DIAGNOSIS — D689 Coagulation defect, unspecified: Secondary | ICD-10-CM | POA: Diagnosis not present

## 2017-06-05 DIAGNOSIS — N2581 Secondary hyperparathyroidism of renal origin: Secondary | ICD-10-CM | POA: Diagnosis not present

## 2017-06-05 DIAGNOSIS — D509 Iron deficiency anemia, unspecified: Secondary | ICD-10-CM | POA: Diagnosis not present

## 2017-06-05 DIAGNOSIS — N186 End stage renal disease: Secondary | ICD-10-CM | POA: Diagnosis not present

## 2017-06-07 DIAGNOSIS — N2581 Secondary hyperparathyroidism of renal origin: Secondary | ICD-10-CM | POA: Diagnosis not present

## 2017-06-07 DIAGNOSIS — N186 End stage renal disease: Secondary | ICD-10-CM | POA: Diagnosis not present

## 2017-06-07 DIAGNOSIS — D689 Coagulation defect, unspecified: Secondary | ICD-10-CM | POA: Diagnosis not present

## 2017-06-07 DIAGNOSIS — D509 Iron deficiency anemia, unspecified: Secondary | ICD-10-CM | POA: Diagnosis not present

## 2017-06-10 DIAGNOSIS — D689 Coagulation defect, unspecified: Secondary | ICD-10-CM | POA: Diagnosis not present

## 2017-06-10 DIAGNOSIS — D509 Iron deficiency anemia, unspecified: Secondary | ICD-10-CM | POA: Diagnosis not present

## 2017-06-10 DIAGNOSIS — N186 End stage renal disease: Secondary | ICD-10-CM | POA: Diagnosis not present

## 2017-06-10 DIAGNOSIS — N2581 Secondary hyperparathyroidism of renal origin: Secondary | ICD-10-CM | POA: Diagnosis not present

## 2017-06-12 DIAGNOSIS — D509 Iron deficiency anemia, unspecified: Secondary | ICD-10-CM | POA: Diagnosis not present

## 2017-06-12 DIAGNOSIS — N186 End stage renal disease: Secondary | ICD-10-CM | POA: Diagnosis not present

## 2017-06-12 DIAGNOSIS — D689 Coagulation defect, unspecified: Secondary | ICD-10-CM | POA: Diagnosis not present

## 2017-06-12 DIAGNOSIS — N2581 Secondary hyperparathyroidism of renal origin: Secondary | ICD-10-CM | POA: Diagnosis not present

## 2017-06-13 ENCOUNTER — Ambulatory Visit (HOSPITAL_COMMUNITY)
Admission: RE | Admit: 2017-06-13 | Discharge: 2017-06-13 | Disposition: A | Discharge status: Home / Self Care | Payer: Medicare Other | Source: Ambulatory Visit | Attending: Vascular Surgery | Admitting: Vascular Surgery

## 2017-06-13 DIAGNOSIS — N186 End stage renal disease: Secondary | ICD-10-CM | POA: Diagnosis not present

## 2017-06-13 DIAGNOSIS — Z992 Dependence on renal dialysis: Secondary | ICD-10-CM

## 2017-06-14 DIAGNOSIS — N2581 Secondary hyperparathyroidism of renal origin: Secondary | ICD-10-CM | POA: Diagnosis not present

## 2017-06-14 DIAGNOSIS — D689 Coagulation defect, unspecified: Secondary | ICD-10-CM | POA: Diagnosis not present

## 2017-06-14 DIAGNOSIS — D509 Iron deficiency anemia, unspecified: Secondary | ICD-10-CM | POA: Diagnosis not present

## 2017-06-14 DIAGNOSIS — N186 End stage renal disease: Secondary | ICD-10-CM | POA: Diagnosis not present

## 2017-06-16 DIAGNOSIS — Z992 Dependence on renal dialysis: Secondary | ICD-10-CM | POA: Diagnosis not present

## 2017-06-16 DIAGNOSIS — I129 Hypertensive chronic kidney disease with stage 1 through stage 4 chronic kidney disease, or unspecified chronic kidney disease: Secondary | ICD-10-CM | POA: Diagnosis not present

## 2017-06-16 DIAGNOSIS — N186 End stage renal disease: Secondary | ICD-10-CM | POA: Diagnosis not present

## 2017-06-17 DIAGNOSIS — D689 Coagulation defect, unspecified: Secondary | ICD-10-CM | POA: Diagnosis not present

## 2017-06-17 DIAGNOSIS — N186 End stage renal disease: Secondary | ICD-10-CM | POA: Diagnosis not present

## 2017-06-17 DIAGNOSIS — D509 Iron deficiency anemia, unspecified: Secondary | ICD-10-CM | POA: Diagnosis not present

## 2017-06-17 DIAGNOSIS — N2581 Secondary hyperparathyroidism of renal origin: Secondary | ICD-10-CM | POA: Diagnosis not present

## 2017-06-18 ENCOUNTER — Encounter: Payer: Self-pay | Admitting: Vascular Surgery

## 2017-06-18 ENCOUNTER — Ambulatory Visit (INDEPENDENT_AMBULATORY_CARE_PROVIDER_SITE_OTHER): Payer: Medicare Other | Admitting: Vascular Surgery

## 2017-06-18 ENCOUNTER — Other Ambulatory Visit: Payer: Self-pay

## 2017-06-18 VITALS — BP 107/66 | HR 63 | Temp 98.7°F | Resp 16 | Ht 66.0 in | Wt 177.7 lb

## 2017-06-18 DIAGNOSIS — N186 End stage renal disease: Secondary | ICD-10-CM

## 2017-06-18 DIAGNOSIS — Z992 Dependence on renal dialysis: Secondary | ICD-10-CM

## 2017-06-18 NOTE — Progress Notes (Signed)
Patient name: Roger [name] MRN: [id number] DOB: [birth year] Sex: male  REASON FOR VISIT:   Follow-up of steal symptoms right upper extremity.  HPI:   Roger [name] is a pleasant 77 y.o. male who had a right brachiocephalic fistula placed in January 2019.  He presented with steal symptoms.  On 05/12/2017 he underwent banding of his right brachiocephalic fistula and ligation of competing branch of the fistula.  He comes in for a follow-up visit.  He continues to have some pain in the right hand and paresthesias.  He sometimes has a hard time sleeping at night because of the pain.  They are using his catheter for dialysis.  He dialyzes on Tuesdays Thursdays and Saturdays.  Current Outpatient Medications  Medication Sig Dispense Refill  . allopurinol (ZYLOPRIM) 100 MG tablet Take 1 tablet (100 mg total) by mouth daily. 30 tablet 0  . amLODipine (NORVASC) 10 MG tablet Take 1 tablet (10 mg total) by mouth daily. 90 tablet 3  . atorvastatin (LIPITOR) 80 MG tablet Take 1 tablet (80 mg total) by mouth daily at 6 PM. 90 tablet 3  . b complex vitamins tablet Take 1 tablet by mouth at bedtime.     . calcitRIOL (ROCALTROL) 0.25 MCG capsule Take 1 capsule (0.25 mcg total) by mouth Every Tuesday,Thursday,and Saturday with dialysis. 30 capsule 0  . carvedilol (COREG) 6.25 MG tablet Take 1 tablet (6.25 mg total) by mouth 2 (two) times daily with a meal. 180 tablet 3  . clopidogrel (PLAVIX) 75 MG tablet Take 1 tablet (75 mg total) by mouth daily. 90 tablet 3  . isosorbide mononitrate (IMDUR) 30 MG 24 hr tablet Take 30 mg by mouth daily.    . ranitidine (ZANTAC) 150 MG tablet Take 150 mg by mouth daily as needed for heartburn.      No current facility-administered medications for this visit.     REVIEW OF SYSTEMS:  [X]  denotes positive finding, [ ]  denotes negative finding Cardiac  Comments:  Chest pain or chest pressure:    Shortness of breath upon exertion:    Short of breath when lying flat:      Irregular heart rhythm:    Constitutional    Fever or chills:     PHYSICAL EXAM:   Vitals:   06/18/17 1518  BP: 107/66  Pulse: 63  Resp: 16  Temp: 98.7 F (37.1 C)  TempSrc: Oral  SpO2: 99%  Weight: 177 lb 11.2 oz (80.6 kg)  Height: 5\' 6"  (1.676 m)    GENERAL: The patient is a well-nourished male, in no acute distress. The vital signs are documented above. CARDIOVASCULAR: There is a regular rate and rhythm. PULMONARY: There is good air exchange bilaterally without wheezing or rales. His fistula has a palpable thrill but is not especially strong. He has a monophasic radial signal with a Doppler which augments with compression of his fistula.  DATA:   No new data.  MEDICAL ISSUES:   STEAL SYNDROME RIGHT UPPER EXTREMITY: This patient has had banding of his right upper arm fistula.  He continues to have symptoms in the right arm although he states that this is is tolerable.  Given the persistent symptoms and sometimes pain at night which keeps him from sleeping I have recommended ligation of the fistula.  I explained that if we try to narrow the fistula anymore I think the fistula will clot.  I think is also more likely to have symptoms once they begin using the fistula  for dialysis.  He feels strongly about not ligating the fistula.  I explained that if we do ligated we will not immediately place a new access until he is undergone a more invasive workup  in order to lower his risk of having steal symptoms with any new access.  He will call if his symptoms progress.  I have instructed them that they can begin using the fistula in 1 month.  [name] [facility] [phone]

## 2017-06-18 NOTE — H&P (View-Only) (Signed)
Patient name: Roger [name] MRN: [id number] DOB: Jun 18, 1940 Sex: male  REASON FOR VISIT:   Follow-up of steal symptoms right upper extremity.  HPI:   Roger [name] is a pleasant 77 y.o. male who had a right brachiocephalic fistula placed in January 2019.  He presented with steal symptoms.  On 05/12/2017 he underwent banding of his right brachiocephalic fistula and ligation of competing branch of the fistula.  He comes in for a follow-up visit.  He continues to have some pain in the right hand and paresthesias.  He sometimes has a hard time sleeping at night because of the pain.  They are using his catheter for dialysis.  He dialyzes on Tuesdays Thursdays and Saturdays.  Current Outpatient Medications  Medication Sig Dispense Refill  . allopurinol (ZYLOPRIM) 100 MG tablet Take 1 tablet (100 mg total) by mouth daily. 30 tablet 0  . amLODipine (NORVASC) 10 MG tablet Take 1 tablet (10 mg total) by mouth daily. 90 tablet 3  . atorvastatin (LIPITOR) 80 MG tablet Take 1 tablet (80 mg total) by mouth daily at 6 PM. 90 tablet 3  . b complex vitamins tablet Take 1 tablet by mouth at bedtime.     . calcitRIOL (ROCALTROL) 0.25 MCG capsule Take 1 capsule (0.25 mcg total) by mouth Every Tuesday,Thursday,and Saturday with dialysis. 30 capsule 0  . carvedilol (COREG) 6.25 MG tablet Take 1 tablet (6.25 mg total) by mouth 2 (two) times daily with a meal. 180 tablet 3  . clopidogrel (PLAVIX) 75 MG tablet Take 1 tablet (75 mg total) by mouth daily. 90 tablet 3  . isosorbide mononitrate (IMDUR) 30 MG 24 hr tablet Take 30 mg by mouth daily.    . ranitidine (ZANTAC) 150 MG tablet Take 150 mg by mouth daily as needed for heartburn.      No current facility-administered medications for this visit.     REVIEW OF SYSTEMS:  [X]  denotes positive finding, [ ]  denotes negative finding Cardiac  Comments:  Chest pain or chest pressure:    Shortness of breath upon exertion:    Short of breath when lying flat:      Irregular heart rhythm:    Constitutional    Fever or chills:     PHYSICAL EXAM:   Vitals:   06/18/17 1518  BP: 107/66  Pulse: 63  Resp: 16  Temp: 98.7 F (37.1 C)  TempSrc: Oral  SpO2: 99%  Weight: 177 lb 11.2 oz (80.6 kg)  Height: 5\' 6"  (1.676 m)    GENERAL: The patient is a well-nourished male, in no acute distress. The vital signs are documented above. CARDIOVASCULAR: There is a regular rate and rhythm. PULMONARY: There is good air exchange bilaterally without wheezing or rales. His fistula has a palpable thrill but is not especially strong. He has a monophasic radial signal with a Doppler which augments with compression of his fistula.  DATA:   No new data.  MEDICAL ISSUES:   STEAL SYNDROME RIGHT UPPER EXTREMITY: This patient has had banding of his right upper arm fistula.  He continues to have symptoms in the right arm although he states that this is is tolerable.  Given the persistent symptoms and sometimes pain at night which keeps him from sleeping I have recommended ligation of the fistula.  I explained that if we try to narrow the fistula anymore I think the fistula will clot.  I think is also more likely to have symptoms once they begin using the fistula  for dialysis.  He feels strongly about not ligating the fistula.  I explained that if we do ligated we will not immediately place a new access until he is undergone a more invasive workup  in order to lower his risk of having steal symptoms with any new access.  He will call if his symptoms progress.  I have instructed them that they can begin using the fistula in 1 month.  Roger [name] [facility] [phone]

## 2017-06-19 DIAGNOSIS — D689 Coagulation defect, unspecified: Secondary | ICD-10-CM | POA: Diagnosis not present

## 2017-06-19 DIAGNOSIS — D509 Iron deficiency anemia, unspecified: Secondary | ICD-10-CM | POA: Diagnosis not present

## 2017-06-19 DIAGNOSIS — N186 End stage renal disease: Secondary | ICD-10-CM | POA: Diagnosis not present

## 2017-06-19 DIAGNOSIS — N2581 Secondary hyperparathyroidism of renal origin: Secondary | ICD-10-CM | POA: Diagnosis not present

## 2017-06-21 DIAGNOSIS — D509 Iron deficiency anemia, unspecified: Secondary | ICD-10-CM | POA: Diagnosis not present

## 2017-06-21 DIAGNOSIS — D689 Coagulation defect, unspecified: Secondary | ICD-10-CM | POA: Diagnosis not present

## 2017-06-21 DIAGNOSIS — N186 End stage renal disease: Secondary | ICD-10-CM | POA: Diagnosis not present

## 2017-06-21 DIAGNOSIS — N2581 Secondary hyperparathyroidism of renal origin: Secondary | ICD-10-CM | POA: Diagnosis not present

## 2017-06-24 DIAGNOSIS — D509 Iron deficiency anemia, unspecified: Secondary | ICD-10-CM | POA: Diagnosis not present

## 2017-06-24 DIAGNOSIS — N2581 Secondary hyperparathyroidism of renal origin: Secondary | ICD-10-CM | POA: Diagnosis not present

## 2017-06-24 DIAGNOSIS — D689 Coagulation defect, unspecified: Secondary | ICD-10-CM | POA: Diagnosis not present

## 2017-06-24 DIAGNOSIS — N186 End stage renal disease: Secondary | ICD-10-CM | POA: Diagnosis not present

## 2017-06-26 DIAGNOSIS — N2581 Secondary hyperparathyroidism of renal origin: Secondary | ICD-10-CM | POA: Diagnosis not present

## 2017-06-26 DIAGNOSIS — N186 End stage renal disease: Secondary | ICD-10-CM | POA: Diagnosis not present

## 2017-06-26 DIAGNOSIS — D509 Iron deficiency anemia, unspecified: Secondary | ICD-10-CM | POA: Diagnosis not present

## 2017-06-26 DIAGNOSIS — D689 Coagulation defect, unspecified: Secondary | ICD-10-CM | POA: Diagnosis not present

## 2017-06-27 ENCOUNTER — Other Ambulatory Visit: Payer: Self-pay | Admitting: *Deleted

## 2017-06-28 DIAGNOSIS — N186 End stage renal disease: Secondary | ICD-10-CM | POA: Diagnosis not present

## 2017-06-28 DIAGNOSIS — D509 Iron deficiency anemia, unspecified: Secondary | ICD-10-CM | POA: Diagnosis not present

## 2017-06-28 DIAGNOSIS — N2581 Secondary hyperparathyroidism of renal origin: Secondary | ICD-10-CM | POA: Diagnosis not present

## 2017-06-28 DIAGNOSIS — D689 Coagulation defect, unspecified: Secondary | ICD-10-CM | POA: Diagnosis not present

## 2017-07-01 DIAGNOSIS — D509 Iron deficiency anemia, unspecified: Secondary | ICD-10-CM | POA: Diagnosis not present

## 2017-07-01 DIAGNOSIS — D689 Coagulation defect, unspecified: Secondary | ICD-10-CM | POA: Diagnosis not present

## 2017-07-01 DIAGNOSIS — N186 End stage renal disease: Secondary | ICD-10-CM | POA: Diagnosis not present

## 2017-07-01 DIAGNOSIS — N2581 Secondary hyperparathyroidism of renal origin: Secondary | ICD-10-CM | POA: Diagnosis not present

## 2017-07-02 ENCOUNTER — Other Ambulatory Visit: Payer: Self-pay

## 2017-07-02 ENCOUNTER — Encounter (HOSPITAL_COMMUNITY): Payer: Self-pay | Admitting: *Deleted

## 2017-07-02 NOTE — Progress Notes (Signed)
Spoke with pt for pre-op call. Pt has hx of coronary artery stents placed 12/04/16. Pt is on Plavix and has been instructed to continue prior to surgery, but not take day of surgery, per Dr. Nicole Cella office. Pt states he has chest pain occasionally during dialysis but has soon as dialysis ends, the chest pain goes away. Pt states he is not diabetic.

## 2017-07-03 ENCOUNTER — Ambulatory Visit (HOSPITAL_COMMUNITY): Payer: Medicare Other | Admitting: Certified Registered"

## 2017-07-03 ENCOUNTER — Ambulatory Visit (HOSPITAL_COMMUNITY)
Admission: RE | Admit: 2017-07-03 | Discharge: 2017-07-03 | Disposition: A | Discharge status: Home / Self Care | Payer: Medicare Other | Source: Ambulatory Visit | Attending: Vascular Surgery | Admitting: Vascular Surgery

## 2017-07-03 ENCOUNTER — Encounter (HOSPITAL_COMMUNITY)
Admission: RE | Disposition: A | Discharge status: Home / Self Care | Payer: Self-pay | Source: Ambulatory Visit | Attending: Vascular Surgery

## 2017-07-03 ENCOUNTER — Encounter (HOSPITAL_COMMUNITY): Payer: Self-pay | Admitting: *Deleted

## 2017-07-03 DIAGNOSIS — I4891 Unspecified atrial fibrillation: Secondary | ICD-10-CM | POA: Diagnosis not present

## 2017-07-03 DIAGNOSIS — Z955 Presence of coronary angioplasty implant and graft: Secondary | ICD-10-CM | POA: Diagnosis not present

## 2017-07-03 DIAGNOSIS — I252 Old myocardial infarction: Secondary | ICD-10-CM | POA: Insufficient documentation

## 2017-07-03 DIAGNOSIS — Z7902 Long term (current) use of antithrombotics/antiplatelets: Secondary | ICD-10-CM | POA: Insufficient documentation

## 2017-07-03 DIAGNOSIS — Z79899 Other long term (current) drug therapy: Secondary | ICD-10-CM | POA: Insufficient documentation

## 2017-07-03 DIAGNOSIS — K219 Gastro-esophageal reflux disease without esophagitis: Secondary | ICD-10-CM | POA: Diagnosis not present

## 2017-07-03 DIAGNOSIS — I2510 Atherosclerotic heart disease of native coronary artery without angina pectoris: Secondary | ICD-10-CM | POA: Diagnosis not present

## 2017-07-03 DIAGNOSIS — T82898A Other specified complication of vascular prosthetic devices, implants and grafts, initial encounter: Secondary | ICD-10-CM | POA: Insufficient documentation

## 2017-07-03 DIAGNOSIS — Y832 Surgical operation with anastomosis, bypass or graft as the cause of abnormal reaction of the patient, or of later complication, without mention of misadventure at the time of the procedure: Secondary | ICD-10-CM | POA: Insufficient documentation

## 2017-07-03 DIAGNOSIS — N186 End stage renal disease: Secondary | ICD-10-CM | POA: Diagnosis not present

## 2017-07-03 DIAGNOSIS — I120 Hypertensive chronic kidney disease with stage 5 chronic kidney disease or end stage renal disease: Secondary | ICD-10-CM | POA: Diagnosis not present

## 2017-07-03 DIAGNOSIS — Z87891 Personal history of nicotine dependence: Secondary | ICD-10-CM | POA: Diagnosis not present

## 2017-07-03 DIAGNOSIS — I739 Peripheral vascular disease, unspecified: Secondary | ICD-10-CM | POA: Diagnosis not present

## 2017-07-03 DIAGNOSIS — Z992 Dependence on renal dialysis: Secondary | ICD-10-CM | POA: Diagnosis not present

## 2017-07-03 DIAGNOSIS — T82898S Other specified complication of vascular prosthetic devices, implants and grafts, sequela: Secondary | ICD-10-CM | POA: Diagnosis not present

## 2017-07-03 HISTORY — PX: LIGATION OF ARTERIOVENOUS  FISTULA: SHX5948

## 2017-07-03 HISTORY — DX: Headache: R51

## 2017-07-03 HISTORY — DX: Personal history of urinary calculi: Z87.442

## 2017-07-03 HISTORY — DX: Headache, unspecified: R51.9

## 2017-07-03 LAB — POCT I-STAT 4, (NA,K, GLUC, HGB,HCT)
Glucose, Bld: 95 mg/dL (ref 65–99)
HEMATOCRIT: 33 % — AB (ref 39.0–52.0)
Hemoglobin: 11.2 g/dL — ABNORMAL LOW (ref 13.0–17.0)
Potassium: 3.9 mmol/L (ref 3.5–5.1)
SODIUM: 137 mmol/L (ref 135–145)

## 2017-07-03 SURGERY — LIGATION OF ARTERIOVENOUS  FISTULA
Anesthesia: Monitor Anesthesia Care | Site: Arm Upper | Laterality: Right

## 2017-07-03 MED ORDER — LIDOCAINE-EPINEPHRINE (PF) 1 %-1:200000 IJ SOLN
INTRAMUSCULAR | Status: DC | PRN
  Administered 2017-07-03: 30 mL

## 2017-07-03 MED ORDER — 0.9 % SODIUM CHLORIDE (POUR BTL) OPTIME
TOPICAL | Status: DC | PRN
  Administered 2017-07-03: 1000 mL

## 2017-07-03 MED ORDER — EPHEDRINE 5 MG/ML INJ
INTRAVENOUS | Status: AC
  Filled 2017-07-03: qty 10

## 2017-07-03 MED ORDER — PHENYLEPHRINE 40 MCG/ML (10ML) SYRINGE FOR IV PUSH (FOR BLOOD PRESSURE SUPPORT)
PREFILLED_SYRINGE | INTRAVENOUS | Status: DC | PRN
  Administered 2017-07-03: 80 ug via INTRAVENOUS

## 2017-07-03 MED ORDER — SODIUM CHLORIDE 0.9 % IV SOLN
INTRAVENOUS | Status: DC
  Administered 2017-07-03: 07:00:00 via INTRAVENOUS

## 2017-07-03 MED ORDER — PHENYLEPHRINE 40 MCG/ML (10ML) SYRINGE FOR IV PUSH (FOR BLOOD PRESSURE SUPPORT)
PREFILLED_SYRINGE | INTRAVENOUS | Status: AC
  Filled 2017-07-03: qty 10

## 2017-07-03 MED ORDER — ONDANSETRON HCL 4 MG/2ML IJ SOLN: 4.0000 mg | Freq: Four times a day (QID) | INTRAMUSCULAR | Status: DC | PRN

## 2017-07-03 MED ORDER — LIDOCAINE HCL (PF) 1 % IJ SOLN
INTRAMUSCULAR | Status: AC
  Filled 2017-07-03: qty 30

## 2017-07-03 MED ORDER — OXYCODONE HCL 5 MG PO TABS: 5.0000 mg | ORAL_TABLET | ORAL | 0 refills | Status: DC | PRN

## 2017-07-03 MED ORDER — PROPOFOL 10 MG/ML IV BOLUS
INTRAVENOUS | Status: DC | PRN
  Administered 2017-07-03: 30 mg via INTRAVENOUS

## 2017-07-03 MED ORDER — OXYCODONE HCL 5 MG PO TABS
ORAL_TABLET | ORAL | Status: DC
Start: 2017-07-03 — End: 2017-07-03
  Filled 2017-07-03: qty 1

## 2017-07-03 MED ORDER — EPHEDRINE SULFATE-NACL 50-0.9 MG/10ML-% IV SOSY
PREFILLED_SYRINGE | INTRAVENOUS | Status: DC | PRN
  Administered 2017-07-03: 15 mg via INTRAVENOUS
  Administered 2017-07-03: 10 mg via INTRAVENOUS
  Administered 2017-07-03: 5 mg via INTRAVENOUS

## 2017-07-03 MED ORDER — FENTANYL CITRATE (PF) 250 MCG/5ML IJ SOLN
INTRAMUSCULAR | Status: AC
  Filled 2017-07-03: qty 5

## 2017-07-03 MED ORDER — FENTANYL CITRATE (PF) 250 MCG/5ML IJ SOLN
INTRAMUSCULAR | Status: DC | PRN
  Administered 2017-07-03 (×2): 25 ug via INTRAVENOUS

## 2017-07-03 MED ORDER — PROPOFOL 10 MG/ML IV BOLUS
INTRAVENOUS | Status: AC
  Filled 2017-07-03: qty 20

## 2017-07-03 MED ORDER — OXYCODONE HCL 5 MG PO TABS
5.0000 mg | ORAL_TABLET | Freq: Once | ORAL | Status: AC | PRN
  Administered 2017-07-03: 5 mg via ORAL

## 2017-07-03 MED ORDER — CEFAZOLIN SODIUM-DEXTROSE 2-4 GM/100ML-% IV SOLN
2.0000 g | INTRAVENOUS | Status: AC
  Administered 2017-07-03: 2 g via INTRAVENOUS
  Filled 2017-07-03: qty 100

## 2017-07-03 MED ORDER — ONDANSETRON HCL 4 MG/2ML IJ SOLN
INTRAMUSCULAR | Status: AC
  Filled 2017-07-03: qty 2

## 2017-07-03 MED ORDER — PROPOFOL 500 MG/50ML IV EMUL
INTRAVENOUS | Status: DC | PRN
  Administered 2017-07-03: 75 ug/kg/min via INTRAVENOUS

## 2017-07-03 MED ORDER — OXYCODONE HCL 5 MG/5ML PO SOLN: 5.0000 mg | Freq: Once | ORAL | Status: AC | PRN

## 2017-07-03 MED ORDER — ONDANSETRON HCL 4 MG/2ML IJ SOLN
INTRAMUSCULAR | Status: DC | PRN
  Administered 2017-07-03: 4 mg via INTRAVENOUS

## 2017-07-03 MED ORDER — MIDAZOLAM HCL 2 MG/2ML IJ SOLN
INTRAMUSCULAR | Status: AC
  Filled 2017-07-03: qty 2

## 2017-07-03 MED ORDER — FENTANYL CITRATE (PF) 100 MCG/2ML IJ SOLN
INTRAMUSCULAR | Status: AC
  Filled 2017-07-03: qty 2

## 2017-07-03 MED ORDER — LIDOCAINE-EPINEPHRINE (PF) 1 %-1:200000 IJ SOLN
INTRAMUSCULAR | Status: AC
  Filled 2017-07-03: qty 30

## 2017-07-03 MED ORDER — FENTANYL CITRATE (PF) 100 MCG/2ML IJ SOLN
25.0000 ug | INTRAMUSCULAR | Status: DC | PRN
  Administered 2017-07-03 (×2): 25 ug via INTRAVENOUS

## 2017-07-03 SURGICAL SUPPLY — 36 items
ADH SKN CLS APL DERMABOND .7 (GAUZE/BANDAGES/DRESSINGS) ×1
CANISTER SUCT 3000ML PPV (MISCELLANEOUS) ×3 IMPLANT
CANNULA VESSEL 3MM 2 BLNT TIP (CANNULA) IMPLANT
CLIP VESOCCLUDE MED 6/CT (CLIP) ×2 IMPLANT
CLIP VESOCCLUDE SM WIDE 6/CT (CLIP) ×2 IMPLANT
DERMABOND ADVANCED (GAUZE/BANDAGES/DRESSINGS) ×2
DERMABOND ADVANCED .7 DNX12 (GAUZE/BANDAGES/DRESSINGS) ×1 IMPLANT
ELECT CAUTERY BLADE 6.4 (BLADE) ×2 IMPLANT
ELECT REM PT RETURN 9FT ADLT (ELECTROSURGICAL) ×3
ELECTRODE REM PT RTRN 9FT ADLT (ELECTROSURGICAL) ×1 IMPLANT
GLOVE BIO SURGEON STRL SZ7.5 (GLOVE) ×3 IMPLANT
GLOVE BIOGEL PI IND STRL 6.5 (GLOVE) IMPLANT
GLOVE BIOGEL PI IND STRL 7.0 (GLOVE) IMPLANT
GLOVE BIOGEL PI IND STRL 8 (GLOVE) ×1 IMPLANT
GLOVE BIOGEL PI INDICATOR 6.5 (GLOVE) ×4
GLOVE BIOGEL PI INDICATOR 7.0 (GLOVE) ×2
GLOVE BIOGEL PI INDICATOR 8 (GLOVE) ×2
GOWN STRL REUS W/ TWL LRG LVL3 (GOWN DISPOSABLE) ×3 IMPLANT
GOWN STRL REUS W/TWL LRG LVL3 (GOWN DISPOSABLE) ×9
KIT BASIN OR (CUSTOM PROCEDURE TRAY) ×3 IMPLANT
KIT TURNOVER KIT B (KITS) ×3 IMPLANT
NDL HYPO 25GX1X1/2 BEV (NEEDLE) IMPLANT
NEEDLE HYPO 25GX1X1/2 BEV (NEEDLE) IMPLANT
NS IRRIG 1000ML POUR BTL (IV SOLUTION) ×3 IMPLANT
PACK CV ACCESS (CUSTOM PROCEDURE TRAY) ×3 IMPLANT
PAD ARMBOARD 7.5X6 YLW CONV (MISCELLANEOUS) ×6 IMPLANT
SPONGE SURGIFOAM ABS GEL 100 (HEMOSTASIS) IMPLANT
SUT ETHILON 3 0 PS 1 (SUTURE) IMPLANT
SUT PROLENE 6 0 BV (SUTURE) IMPLANT
SUT SILK 0 TIES 10X30 (SUTURE) ×3 IMPLANT
SUT VIC AB 3-0 SH 27 (SUTURE) ×3
SUT VIC AB 3-0 SH 27X BRD (SUTURE) ×1 IMPLANT
SUT VICRYL 4-0 PS2 18IN ABS (SUTURE) ×3 IMPLANT
TOWEL GREEN STERILE (TOWEL DISPOSABLE) ×3 IMPLANT
UNDERPAD 30X30 (UNDERPADS AND DIAPERS) ×3 IMPLANT
WATER STERILE IRR 1000ML POUR (IV SOLUTION) ×3 IMPLANT

## 2017-07-03 NOTE — Anesthesia Preprocedure Evaluation (Signed)
Anesthesia Evaluation  Patient identified by MRN, date of birth, ID band Patient awake    Reviewed: Allergy & Precautions, H&P , NPO status , Patient's Chart, lab work & pertinent test results  Airway Mallampati: II   Neck ROM: full    Dental   Pulmonary former smoker,    breath sounds clear to auscultation       Cardiovascular hypertension, + angina + CAD, + Past MI, + Cardiac Stents and + Peripheral Vascular Disease  + dysrhythmias Atrial Fibrillation  Rhythm:regular Rate:Normal  Stent to mid LAD 11/2016. TTE (10/2016): EF 55-60%   Neuro/Psych  Headaches, PSYCHIATRIC DISORDERS Depression    GI/Hepatic GERD  ,  Endo/Other    Renal/GU ESRF and DialysisRenal disease     Musculoskeletal  (+) Arthritis ,   Abdominal   Peds  Hematology   Anesthesia Other Findings   Reproductive/Obstetrics                             Anesthesia Physical Anesthesia Plan  ASA: III  Anesthesia Plan: MAC   Post-op Pain Management:    Induction: Intravenous  PONV Risk Score and Plan: 1 and Propofol infusion, Ondansetron and Treatment may vary due to age or medical condition  Airway Management Planned: Simple Face Mask  Additional Equipment:   Intra-op Plan:   Post-operative Plan:   Informed Consent: I have reviewed the patients History and Physical, chart, labs and discussed the procedure including the risks, benefits and alternatives for the proposed anesthesia with the patient or authorized representative who has indicated his/her understanding and acceptance.     Plan Discussed with: CRNA, Anesthesiologist and Surgeon  Anesthesia Plan Comments:         Anesthesia Quick Evaluation

## 2017-07-03 NOTE — Transfer of Care (Signed)
Immediate Anesthesia Transfer of Care Note  Patient: Roger [name]  Procedure(s) Performed: LIGATION OF ARTERIOVENOUS  FISTULA RIGHT ARM (Right Arm Upper)  Patient Location: PACU  Anesthesia Type:MAC  Level of Consciousness: drowsy and patient cooperative  Airway & Oxygen Therapy: Patient Spontanous Breathing and Patient connected to face mask oxygen  Post-op Assessment: Report given to RN and Post -op Vital signs reviewed and stable  Post vital signs: Reviewed and stable  Last Vitals:  Vitals Value Taken Time  BP 98/53 07/03/2017  8:10 AM  Temp    Pulse 57 07/03/2017  8:12 AM  Resp 15 07/03/2017  8:12 AM  SpO2 98 % 07/03/2017  8:12 AM  Vitals shown include unvalidated device data.  Last Pain:  Vitals:   07/03/17 0810  TempSrc:   PainSc: (P) 0-No pain      Patients Stated Pain Goal: 1 (82/70/78 6754)  Complications: No apparent anesthesia complications

## 2017-07-03 NOTE — Anesthesia Postprocedure Evaluation (Signed)
Anesthesia Post Note  Patient: Roger [name]  Procedure(s) Performed: LIGATION OF ARTERIOVENOUS  FISTULA RIGHT ARM (Right Arm Upper)     Patient location during evaluation: PACU Anesthesia Type: MAC Level of consciousness: awake and alert Pain management: pain level controlled Vital Signs Assessment: post-procedure vital signs reviewed and stable Respiratory status: spontaneous breathing, nonlabored ventilation, respiratory function stable and patient connected to nasal cannula oxygen Cardiovascular status: stable and blood pressure returned to baseline Postop Assessment: no apparent nausea or vomiting Anesthetic complications: no    Last Vitals:  Vitals:   07/03/17 1030 07/03/17 1156  BP: 129/62 130/61  Pulse: (!) 50 (!) 55  Resp:  18  Temp:    SpO2:  100%    Last Pain:  Vitals:   07/03/17 1156  TempSrc:   PainSc: 0-No pain                 Atia Haupt S

## 2017-07-03 NOTE — Progress Notes (Signed)
During Phase II, patient slid from chair to the floor with daughter present.  RN & CNA assisted patient back to chair.  VS normal, A&O, patient stated he felt fine.  MD assessed in Phase II with daughter present. Daughter stated that patient has had four falls at home and recently began using a walker. Patient suffered a scrape to his L knee that had scant serosanguinous drainage.  RN dressed wound with vaseline gauze and tegaderm.  MD stated patient OK to go home, patient stated he felt fine and daughter agreed. RN documented fall, wound, and completed SZP appropriately.

## 2017-07-03 NOTE — Op Note (Signed)
    NAME: Roger [name]    MRN: [id number] DOB: 1940/05/16    DATE OF OPERATION: 07/03/2017  PREOP DIAGNOSIS:    Steal syndrome right upper extremity  POSTOP DIAGNOSIS:    Same  PROCEDURE:    Ligation of right brachiocephalic AV fistula  SURGEON: Judeth Cornfield. Scot Dock, MD, FACS  ASSIST: None  ANESTHESIA: Local with sedation  EBL: Minimal  INDICATIONS:    Roger [name] is a 77 y.o. male who developed steal after a left upper arm fistula.  This was banded but he continued to have symptoms.  He presents for ligation of his fistula.  FINDINGS:   There was definite improvement in his Doppler signals at the wrist at the completion.  TECHNIQUE:   The patient was taken to the operating room and sedated by anesthesia.  The right upper extremity was prepped and draped in the usual sterile fashion.  After the skin was anesthetized with 1% lidocaine, the incision was opened and the previous band was dissected free and removed from the vein.  The vein was then ligated with 2 2-0 silk ties.  Hemostasis was obtained in the wound.  The wound was closed with a deep layer of 3-0 Vicryl and the skin closed with 4-0 Vicryl.  Dermabond was applied.  Patient tolerated the procedure well and was transferred to the recovery room in stable condition.  All needle and sponge counts were correct.  [name], MD, FACS [facility]  DATE OF DICTATION:   07/03/2017

## 2017-07-03 NOTE — Discharge Instructions (Signed)

## 2017-07-03 NOTE — Progress Notes (Signed)
   Dr Scot Dock notifed of pt c/o right hand fingers numbness says it is not as numb as this morning no new orders pt mau d/c home

## 2017-07-03 NOTE — Interval H&P Note (Signed)
History and Physical Interval Note:  07/03/2017 7:21 AM  Roger [name]  has presented today for surgery, with the diagnosis of steal syndrome  The various methods of treatment have been discussed with the patient and family. After consideration of risks, benefits and other options for treatment, the patient has consented to  Procedure(s): LIGATION OF ARTERIOVENOUS  FISTULA (Right) as a surgical intervention .  The patient's history has been reviewed, patient examined, no change in status, stable for surgery.  I have reviewed the patient's chart and labs.  Questions were answered to the patient's satisfaction.     [name]

## 2017-07-04 ENCOUNTER — Encounter (HOSPITAL_COMMUNITY): Payer: Self-pay | Admitting: Vascular Surgery

## 2017-07-04 DIAGNOSIS — D689 Coagulation defect, unspecified: Secondary | ICD-10-CM | POA: Diagnosis not present

## 2017-07-04 DIAGNOSIS — D509 Iron deficiency anemia, unspecified: Secondary | ICD-10-CM | POA: Diagnosis not present

## 2017-07-04 DIAGNOSIS — N2581 Secondary hyperparathyroidism of renal origin: Secondary | ICD-10-CM | POA: Diagnosis not present

## 2017-07-04 DIAGNOSIS — N186 End stage renal disease: Secondary | ICD-10-CM | POA: Diagnosis not present

## 2017-07-05 DIAGNOSIS — N2581 Secondary hyperparathyroidism of renal origin: Secondary | ICD-10-CM | POA: Diagnosis not present

## 2017-07-05 DIAGNOSIS — D509 Iron deficiency anemia, unspecified: Secondary | ICD-10-CM | POA: Diagnosis not present

## 2017-07-05 DIAGNOSIS — N186 End stage renal disease: Secondary | ICD-10-CM | POA: Diagnosis not present

## 2017-07-05 DIAGNOSIS — D689 Coagulation defect, unspecified: Secondary | ICD-10-CM | POA: Diagnosis not present

## 2017-07-08 DIAGNOSIS — N186 End stage renal disease: Secondary | ICD-10-CM | POA: Diagnosis not present

## 2017-07-08 DIAGNOSIS — D509 Iron deficiency anemia, unspecified: Secondary | ICD-10-CM | POA: Diagnosis not present

## 2017-07-08 DIAGNOSIS — N2581 Secondary hyperparathyroidism of renal origin: Secondary | ICD-10-CM | POA: Diagnosis not present

## 2017-07-08 DIAGNOSIS — D689 Coagulation defect, unspecified: Secondary | ICD-10-CM | POA: Diagnosis not present

## 2017-07-10 DIAGNOSIS — D509 Iron deficiency anemia, unspecified: Secondary | ICD-10-CM | POA: Diagnosis not present

## 2017-07-10 DIAGNOSIS — N2581 Secondary hyperparathyroidism of renal origin: Secondary | ICD-10-CM | POA: Diagnosis not present

## 2017-07-10 DIAGNOSIS — D689 Coagulation defect, unspecified: Secondary | ICD-10-CM | POA: Diagnosis not present

## 2017-07-10 DIAGNOSIS — N186 End stage renal disease: Secondary | ICD-10-CM | POA: Diagnosis not present

## 2017-07-11 ENCOUNTER — Telehealth: Payer: Self-pay | Admitting: Cardiovascular Disease

## 2017-07-11 NOTE — Telephone Encounter (Signed)
lmov for patient to call Change in schedule 08/29/17 provider won't be in office need to reschedule  Will try again at a later time

## 2017-07-12 DIAGNOSIS — N2581 Secondary hyperparathyroidism of renal origin: Secondary | ICD-10-CM | POA: Diagnosis not present

## 2017-07-12 DIAGNOSIS — D689 Coagulation defect, unspecified: Secondary | ICD-10-CM | POA: Diagnosis not present

## 2017-07-12 DIAGNOSIS — D509 Iron deficiency anemia, unspecified: Secondary | ICD-10-CM | POA: Diagnosis not present

## 2017-07-12 DIAGNOSIS — N186 End stage renal disease: Secondary | ICD-10-CM | POA: Diagnosis not present

## 2017-07-15 DIAGNOSIS — D509 Iron deficiency anemia, unspecified: Secondary | ICD-10-CM | POA: Diagnosis not present

## 2017-07-15 DIAGNOSIS — D689 Coagulation defect, unspecified: Secondary | ICD-10-CM | POA: Diagnosis not present

## 2017-07-15 DIAGNOSIS — N2581 Secondary hyperparathyroidism of renal origin: Secondary | ICD-10-CM | POA: Diagnosis not present

## 2017-07-15 DIAGNOSIS — N186 End stage renal disease: Secondary | ICD-10-CM | POA: Diagnosis not present

## 2017-07-15 NOTE — Telephone Encounter (Signed)
Spoke with patient, he will have his daughter call and reschedule this appointment

## 2017-07-15 NOTE — Telephone Encounter (Signed)
Pt has been rescheduled   Nothing else needed

## 2017-07-17 DIAGNOSIS — D509 Iron deficiency anemia, unspecified: Secondary | ICD-10-CM | POA: Diagnosis not present

## 2017-07-17 DIAGNOSIS — N186 End stage renal disease: Secondary | ICD-10-CM | POA: Diagnosis not present

## 2017-07-17 DIAGNOSIS — D631 Anemia in chronic kidney disease: Secondary | ICD-10-CM | POA: Diagnosis not present

## 2017-07-17 DIAGNOSIS — N2581 Secondary hyperparathyroidism of renal origin: Secondary | ICD-10-CM | POA: Diagnosis not present

## 2017-07-17 DIAGNOSIS — R079 Chest pain, unspecified: Secondary | ICD-10-CM | POA: Diagnosis not present

## 2017-07-17 DIAGNOSIS — D689 Coagulation defect, unspecified: Secondary | ICD-10-CM | POA: Diagnosis not present

## 2017-07-19 DIAGNOSIS — R079 Chest pain, unspecified: Secondary | ICD-10-CM | POA: Diagnosis not present

## 2017-07-19 DIAGNOSIS — D509 Iron deficiency anemia, unspecified: Secondary | ICD-10-CM | POA: Diagnosis not present

## 2017-07-19 DIAGNOSIS — D689 Coagulation defect, unspecified: Secondary | ICD-10-CM | POA: Diagnosis not present

## 2017-07-19 DIAGNOSIS — N2581 Secondary hyperparathyroidism of renal origin: Secondary | ICD-10-CM | POA: Diagnosis not present

## 2017-07-19 DIAGNOSIS — D631 Anemia in chronic kidney disease: Secondary | ICD-10-CM | POA: Diagnosis not present

## 2017-07-19 DIAGNOSIS — N186 End stage renal disease: Secondary | ICD-10-CM | POA: Diagnosis not present

## 2017-07-22 DIAGNOSIS — R079 Chest pain, unspecified: Secondary | ICD-10-CM | POA: Diagnosis not present

## 2017-07-22 DIAGNOSIS — N186 End stage renal disease: Secondary | ICD-10-CM | POA: Diagnosis not present

## 2017-07-22 DIAGNOSIS — D631 Anemia in chronic kidney disease: Secondary | ICD-10-CM | POA: Diagnosis not present

## 2017-07-22 DIAGNOSIS — D509 Iron deficiency anemia, unspecified: Secondary | ICD-10-CM | POA: Diagnosis not present

## 2017-07-22 DIAGNOSIS — N2581 Secondary hyperparathyroidism of renal origin: Secondary | ICD-10-CM | POA: Diagnosis not present

## 2017-07-22 DIAGNOSIS — D689 Coagulation defect, unspecified: Secondary | ICD-10-CM | POA: Diagnosis not present

## 2017-07-24 ENCOUNTER — Observation Stay (HOSPITAL_COMMUNITY)
Admission: EM | Admit: 2017-07-24 | Discharge: 2017-07-27 | Disposition: A | Discharge status: Home / Self Care | Payer: Medicare Other | Attending: Emergency Medicine | Admitting: Emergency Medicine

## 2017-07-24 ENCOUNTER — Other Ambulatory Visit: Payer: Self-pay

## 2017-07-24 ENCOUNTER — Emergency Department (HOSPITAL_COMMUNITY): Payer: Medicare Other

## 2017-07-24 ENCOUNTER — Encounter (HOSPITAL_COMMUNITY): Payer: Self-pay

## 2017-07-24 DIAGNOSIS — K5520 Angiodysplasia of colon without hemorrhage: Secondary | ICD-10-CM | POA: Diagnosis present

## 2017-07-24 DIAGNOSIS — I252 Old myocardial infarction: Secondary | ICD-10-CM | POA: Diagnosis not present

## 2017-07-24 DIAGNOSIS — Z87891 Personal history of nicotine dependence: Secondary | ICD-10-CM | POA: Insufficient documentation

## 2017-07-24 DIAGNOSIS — Z8546 Personal history of malignant neoplasm of prostate: Secondary | ICD-10-CM | POA: Insufficient documentation

## 2017-07-24 DIAGNOSIS — Z992 Dependence on renal dialysis: Secondary | ICD-10-CM | POA: Diagnosis not present

## 2017-07-24 DIAGNOSIS — K922 Gastrointestinal hemorrhage, unspecified: Secondary | ICD-10-CM | POA: Diagnosis present

## 2017-07-24 DIAGNOSIS — R112 Nausea with vomiting, unspecified: Principal | ICD-10-CM | POA: Insufficient documentation

## 2017-07-24 DIAGNOSIS — R42 Dizziness and giddiness: Secondary | ICD-10-CM | POA: Insufficient documentation

## 2017-07-24 DIAGNOSIS — Z7902 Long term (current) use of antithrombotics/antiplatelets: Secondary | ICD-10-CM | POA: Insufficient documentation

## 2017-07-24 DIAGNOSIS — K30 Functional dyspepsia: Secondary | ICD-10-CM | POA: Diagnosis present

## 2017-07-24 DIAGNOSIS — N186 End stage renal disease: Secondary | ICD-10-CM | POA: Diagnosis not present

## 2017-07-24 DIAGNOSIS — I739 Peripheral vascular disease, unspecified: Secondary | ICD-10-CM | POA: Diagnosis present

## 2017-07-24 DIAGNOSIS — R142 Eructation: Secondary | ICD-10-CM | POA: Diagnosis not present

## 2017-07-24 DIAGNOSIS — R079 Chest pain, unspecified: Secondary | ICD-10-CM | POA: Diagnosis present

## 2017-07-24 DIAGNOSIS — E7800 Pure hypercholesterolemia, unspecified: Secondary | ICD-10-CM

## 2017-07-24 DIAGNOSIS — I10 Essential (primary) hypertension: Secondary | ICD-10-CM

## 2017-07-24 DIAGNOSIS — Z79899 Other long term (current) drug therapy: Secondary | ICD-10-CM | POA: Insufficient documentation

## 2017-07-24 DIAGNOSIS — D631 Anemia in chronic kidney disease: Secondary | ICD-10-CM | POA: Diagnosis not present

## 2017-07-24 DIAGNOSIS — I2510 Atherosclerotic heart disease of native coronary artery without angina pectoris: Secondary | ICD-10-CM | POA: Diagnosis not present

## 2017-07-24 DIAGNOSIS — D689 Coagulation defect, unspecified: Secondary | ICD-10-CM | POA: Diagnosis not present

## 2017-07-24 DIAGNOSIS — I120 Hypertensive chronic kidney disease with stage 5 chronic kidney disease or end stage renal disease: Secondary | ICD-10-CM | POA: Diagnosis not present

## 2017-07-24 DIAGNOSIS — N2581 Secondary hyperparathyroidism of renal origin: Secondary | ICD-10-CM | POA: Diagnosis not present

## 2017-07-24 DIAGNOSIS — I358 Other nonrheumatic aortic valve disorders: Secondary | ICD-10-CM

## 2017-07-24 DIAGNOSIS — D509 Iron deficiency anemia, unspecified: Secondary | ICD-10-CM | POA: Diagnosis not present

## 2017-07-24 LAB — CBC
HCT: 34.1 % — ABNORMAL LOW (ref 39.0–52.0)
Hemoglobin: 11.1 g/dL — ABNORMAL LOW (ref 13.0–17.0)
MCH: 31 pg (ref 26.0–34.0)
MCHC: 32.6 g/dL (ref 30.0–36.0)
MCV: 95.3 fL (ref 78.0–100.0)
PLATELETS: 242 10*3/uL (ref 150–400)
RBC: 3.58 MIL/uL — AB (ref 4.22–5.81)
RDW: 18.5 % — ABNORMAL HIGH (ref 11.5–15.5)
WBC: 10.3 10*3/uL (ref 4.0–10.5)

## 2017-07-24 LAB — BASIC METABOLIC PANEL
ANION GAP: 15 (ref 5–15)
BUN: 9 mg/dL (ref 6–20)
CHLORIDE: 91 mmol/L — AB (ref 101–111)
CO2: 30 mmol/L (ref 22–32)
CREATININE: 4.2 mg/dL — AB (ref 0.61–1.24)
Calcium: 8.9 mg/dL (ref 8.9–10.3)
GFR calc non Af Amer: 13 mL/min — ABNORMAL LOW (ref >60)
GFR, EST AFRICAN AMERICAN: 15 mL/min — AB (ref >60)
Glucose, Bld: 104 mg/dL — ABNORMAL HIGH (ref 65–99)
POTASSIUM: 3.2 mmol/L — AB (ref 3.5–5.1)
Sodium: 136 mmol/L (ref 135–145)

## 2017-07-24 LAB — I-STAT TROPONIN, ED: Troponin i, poc: 0.02 ng/mL (ref 0.00–0.08)

## 2017-07-24 MED ORDER — PANTOPRAZOLE SODIUM 40 MG PO TBEC
40.0000 mg | DELAYED_RELEASE_TABLET | Freq: Every day | ORAL | Status: DC
  Administered 2017-07-25: 40 mg via ORAL
  Filled 2017-07-24: qty 1

## 2017-07-24 NOTE — ED Provider Notes (Addendum)
[facility] EMERGENCY DEPARTMENT Provider Note   CSN: [id number] Arrival date & time: 07/24/17  1747     History   Chief Complaint Chief Complaint  Patient presents with  . Chest Pain    HPI Roger [name] is a 77 y.o. male with history of CAD known 3v disease s/p stent on plavix, PAD, tobacco use, ESRD on HD TuThSa, anemia, GI bleed here for evaluation of increased persistent dyspepsia, belching associated with sharp central chest pain onset after finishing dialysis treatment. Pt states he finished full HD treatment and was walking out of dialysis when he felt tired, light-headed and like he was going to pass out.  He developed belching soon after that then caused sharp central chest pain, states every time he belches he has the sharp pain in his chest.   States belching is an ongoing chronic issue, daughter states belching happens every night at around 10 pm as well as usually two hours after starting dialysis. Daughter concerned because cardiologist has told pt belching and acid reflux symptoms could be from his heart as well.  Belching itself is non exertional, occurs at rest as well.  Reports one episode of black emesis two days ago.  Has had black looking stools for the last several weeks. Taking zantac since Sunday without relief however none PTA. No interventions PTA.  Daughter states pt has not had any water in 3 weeks, pt states he is just not thirsty. Last visit with Dr [name] cardiology  On 04/2017, documents belching and other GI sxs likely angina equivalent, most noticeable during dialysis due to episodes of hypotension.   HPI  Past Medical History:  Diagnosis Date  . Anemia   . Anginal pain ([place])   . Arthritis    "all over; bad in the legs" (12/04/2016)  . CAD (coronary artery disease)    a. NSTEMI in setting of anemia; b. 10/2016 MV: inflat ST dep with large, reversible ant, apical, inflat defect; c. 10/2016 Cath: LM nl, LAD 40p, 48m, D1 70, D2 80, RI  min irregs, LCX 100ost, RCA 100p, 147m/d, RPDA fills via collats from LAD, RPAV small-->Initial conservative Rx in setting of GIB w/ plan for PCI LAD if H/H stable on ASA/Plavix;  d. 12/04/16 s/p PTCA and DES to mLAD.  Marland Kitchen Depression    situational   . Diastolic dysfunction    a. 10/2016 Echo: EF 55-60%, no rwma, Gr2 DD, triv MR, mildly dil LA.  Marland Kitchen ESRD (end stage renal disease) ([place])    a. 10/2016 HD catheter insterted and HD initiated; b. Pending permanent HD access folllowing Cardiac Revascularization.  . ESRD on dialysis [facility])    "[place]; [address]; TTS usually; going to [facility] in [place], [place] right now while staying w/daughter" (12/04/2016)  . GERD (gastroesophageal reflux disease)   . GIB (gastrointestinal bleeding) 10/2011   a. 10/2016 3u PRBCs - EGD/Colonoscopy: angiodysplasia w/ diverticulosis. No apparent bleeding. Polypecotmy->tubular adenomas.  . Gout   . Headache    no migraines for years  . Heart murmur   . High cholesterol   . History of blood transfusion 10/18/2016   "related to anemia" (12/04/2016)  . History of kidney stones    years ago  . Hypertension   . Iron deficiency anemia   . Myocardial infarction ([place]) 10/18/2016  . Old MI (myocardial infarction)    "found evidence of this on 10/18/2016"  . PAF (paroxysmal atrial fibrillation) ([place])   . Peripheral vascular disease ([place])  BLE  . Prostate cancer ([place])    a. s/p seed implantation.  . Tobacco abuse     Patient Active Problem List   Diagnosis Date Noted  . Right hand pain 05/28/2017  . Angiodysplasia of cecum   . Benign neoplasm of cecum   . Benign neoplasm of ascending colon   . Benign neoplasm of descending colon   . CKD (chronic kidney disease) stage 5, GFR less than 15 ml/min (HCC)   . Melena   . Demand ischemia ([place])   . Lower GI bleed   . Tobacco abuse   . Angina pectoris ([place]) 10/18/2016  . ARF (acute renal failure) ([place]) 10/18/2016  . Hyperkalemia 10/18/2016  . Blood loss anemia 10/18/2016    . Elevated troponin 10/18/2016  . Atherosclerotic PVD with intermittent claudication ([place]) 12/29/2013  . Peripheral vascular disease, unspecified ([place]) 12/09/2012    Past Surgical History:  Procedure Laterality Date  . AV FISTULA PLACEMENT Right 03/28/2017   Procedure: ARTERIOVENOUS (AV) BRACHIOCEPHALIC FISTULA CREATION;  Surgeon: Angelia Mould, MD;  Location: [facility];  Service: Vascular;  Laterality: Right;  . CARDIAC CATHETERIZATION  10/2016  . COLONOSCOPY WITH PROPOFOL N/A 10/21/2016   Procedure: COLONOSCOPY WITH PROPOFOL;  Surgeon: Gatha Mayer, MD;  Location[facility] ENDOSCOPY;  Service: Endoscopy;  Laterality: N/A;  . CORONARY ANGIOPLASTY WITH STENT PLACEMENT  12/04/2016   "1 stent"  . CORONARY STENT INTERVENTION N/A 12/04/2016   Procedure: CORONARY STENT INTERVENTION;  Surgeon: Wellington Hampshire, MD;  Location: [facility] CV LAB;  Service: Cardiovascular;  Laterality: N/A;  . ESOPHAGOGASTRODUODENOSCOPY N/A 10/21/2016   Procedure: ESOPHAGOGASTRODUODENOSCOPY (EGD);  Surgeon: Gatha Mayer, MD;  Location: [facility] ENDOSCOPY;  Service: Endoscopy;  Laterality: N/A;  . INSERTION OF DIALYSIS CATHETER Right 10/22/2016   Procedure: INSERTION OF TUNNELED DIALYSIS CATHETER;  Surgeon: Elam Dutch, MD;  Location: [facility];  Service: Vascular;  Laterality: Right;  . LEFT HEART CATH AND CORONARY ANGIOGRAPHY N/A 10/23/2016   Procedure: LEFT HEART CATH AND CORONARY ANGIOGRAPHY;  Surgeon: Wellington Hampshire, MD;  Location: [facility] CV LAB;  Service: Cardiovascular;  Laterality: N/A;  . LIGATION OF ARTERIOVENOUS  FISTULA Right 05/12/2017   Procedure: BANDING OF RIGHT UPPER ARM ARTERIOVENOUS  FISTULA USING 6MM X 10CM GORE TEX VASCULAR GRAFT;  Surgeon: Angelia Mould, MD;  Location: [facility];  Service: Vascular;  Laterality: Right;  . LIGATION OF ARTERIOVENOUS  FISTULA Right 07/03/2017   Procedure: LIGATION OF ARTERIOVENOUS  FISTULA RIGHT ARM;  Surgeon: Angelia Mould, MD;  Location: [facility];   Service: Vascular;  Laterality: Right;  . TONSILLECTOMY          Home Medications    Prior to Admission medications   Medication Sig Start Date End Date Taking? Authorizing Provider  allopurinol (ZYLOPRIM) 100 MG tablet Take 1 tablet (100 mg total) by mouth daily. 10/26/16  Yes Florencia Reasons, MD  amLODipine (NORVASC) 10 MG tablet Take 1 tablet (10 mg total) by mouth daily. 11/26/16  Yes Theora Gianotti, NP  atorvastatin (LIPITOR) 80 MG tablet Take 1 tablet (80 mg total) by mouth daily at 6 PM. 11/26/16  Yes Theora Gianotti, NP  b complex vitamins tablet Take 1 tablet by mouth See admin instructions. Given at dialysis   Yes [provider]  calcitRIOL (ROCALTROL) 0.25 MCG capsule Take 1 capsule (0.25 mcg total) by mouth Every Tuesday,Thursday,and Saturday with dialysis. 12/05/16  Yes Bhagat, Bhavinkumar, PA  carvedilol (COREG) 6.25 MG tablet Take 1 tablet (  6.25 mg total) by mouth 2 (two) times daily with a meal. 04/25/17  Yes Wellington Hampshire, MD  clopidogrel (PLAVIX) 75 MG tablet Take 1 tablet (75 mg total) by mouth daily. 11/26/16  Yes Theora Gianotti, NP  ferric citrate (AURYXIA) 1 GM 210 MG(Fe) tablet Take 210 mg by mouth 2 (two) times daily.   Yes [provider]  isosorbide mononitrate (IMDUR) 30 MG 24 hr tablet Take 30 mg by mouth daily. 01/24/17  Yes [provider]  ranitidine (ZANTAC) 150 MG tablet Take 150 mg by mouth daily.    Yes [provider]  oxyCODONE (ROXICODONE) 5 MG immediate release tablet Take 1 tablet (5 mg total) by mouth every 4 (four) hours as needed for severe pain. 07/03/17   Angelia Mould, MD    Family History Family History  Problem Relation Age of Onset  . Cancer Mother   . Cancer Father   . Heart attack Father   . Diabetes Father   . Hypertension Father   . Cancer Sister   . Cancer Brother   . Diabetes Brother   . Hypertension Brother   . Hypertension Daughter     Social  History Social History   Tobacco Use  . Smoking status: Former Smoker    Packs/day: 0.50    Years: 50.00    Pack years: 25.00    Types: Cigarettes    Last attempt to quit: 10/18/2016    Years since quitting: 0.7  . Smokeless tobacco: Never Used  Substance Use Topics  . Alcohol use: No  . Drug use: No     Allergies   Patient has no known allergies.   Review of Systems Review of Systems  Cardiovascular: Positive for chest pain.  Gastrointestinal:       Belching   Neurological: Positive for light-headedness.  All other systems reviewed and are negative.    Physical Exam Updated Vital Signs BP 114/64   Pulse 64   Temp 98.3 F (36.8 C) (Oral)   Resp (!) 24   Ht 5\' 6"  (1.676 m)   Wt 80.3 kg (177 lb)   SpO2 96%   BMI 28.57 kg/m   Physical Exam  Constitutional: He is oriented to person, place, and time. He appears well-developed and well-nourished. No distress.  Frequent loud belching throughout exam. Non toxic. Daughter at bedside.   HENT:  Head: Normocephalic and atraumatic.  Nose: Nose normal.  Moist mucous membranes   Eyes: Pupils are equal, round, and reactive to light. Conjunctivae and EOM are normal.  Neck: Normal range of motion.  Cardiovascular: Normal rate, regular rhythm, S1 normal, S2 normal, normal heart sounds and intact distal pulses.  2+ DP and radial pulses bilaterally. No LE edema.   Pulmonary/Chest: Effort normal and breath sounds normal.  No chest wall tenderness  Abdominal: Soft. Bowel sounds are normal. There is tenderness in the epigastric area.  No G/R/R. No suprapubic or CVA tenderness. Negative Murphy's and McBurney's  Genitourinary:  Genitourinary Comments:  RN present during exam No pain in perianal/rectal area with palpation.  There are no external fissures or external hemorrhoids noted.  No induration or swelling of the perianal skin.   Stool color is dark green with no gross blood noted.    Musculoskeletal: Normal range of  motion.  Neurological: He is alert and oriented to person, place, and time.  Skin: Skin is warm and dry. Capillary refill takes less than 2 seconds.  Psychiatric: He has a normal  mood and affect. His behavior is normal. Judgment and thought content normal.  Nursing note and vitals reviewed.    ED Treatments / Results  Labs (all labs ordered are listed, but only abnormal results are displayed) Labs Reviewed  BASIC METABOLIC PANEL - Abnormal; Notable for the following components:      Result Value   Potassium 3.2 (*)    Chloride 91 (*)    Glucose, Bld 104 (*)    Creatinine, Ser 4.20 (*)    GFR calc non Af Amer 13 (*)    GFR calc Af Amer 15 (*)    All other components within normal limits  CBC - Abnormal; Notable for the following components:   RBC 3.58 (*)    Hemoglobin 11.1 (*)    HCT 34.1 (*)    RDW 18.5 (*)    All other components within normal limits  I-STAT TROPONIN, ED  I-STAT TROPONIN, ED  POC OCCULT BLOOD, ED  TYPE AND SCREEN    EKG EKG Interpretation  Date/Time:  Thursday Jul 24 2017 18:08:34 EDT Ventricular Rate:  75 PR Interval:  226 QRS Duration: 116 QT Interval:  424 QTC Calculation: 473 R Axis:   44 Text Interpretation:  Sinus rhythm with 1st degree A-V block Nonspecific ST and T wave abnormality Prolonged QT Abnormal ECG No significant change since last tracing Confirmed by Deno Etienne [phone]) on 07/24/2017 11:52:50 PM   Radiology Dg Chest 2 View  Result Date: 07/24/2017 CLINICAL DATA:  Central chest pain. EXAM: CHEST - 2 VIEW COMPARISON:  10/22/2016 FINDINGS: AP and lateral views of the chest show no focal airspace consolidation. No pulmonary edema or pleural effusion. The cardiopericardial silhouette is within normal limits for size. Right IJ dialysis catheter tip overlies the SVC/RA junction. The visualized bony structures of the thorax are intact. IMPRESSION: No active cardiopulmonary disease. Electronically Signed   By: Misty Stanley M.D.   On:  07/24/2017 19:07    Procedures Procedures (including critical care time)  Medications Ordered in ED Medications  pantoprazole (PROTONIX) EC tablet 40 mg (has no administration in time range)     Initial Impression / Assessment and Plan / ED Course  I have reviewed the triage vital signs and the nursing notes.  Pertinent labs & imaging results that were available during my care of the patient were reviewed by me and considered in my medical decision making (see chart for details).  Clinical Course as of Jul 26 123  Thu Jul 24, 2017  2307 Hemoglobin(!): 11.1 [CG]  2307 RBC(!): 3.58 [CG]  2307 Creatinine(!): 4.20 [CG]  2307 GFR, Est African American(!): 15 [CG]  Fri Jul 25, 2017  0026 Sinus rhythm with 1st degree A-V block Nonspecific ST and T wave abnormality Prolonged QT Abnormal ECG No significant change since last tracing  EKG 12-Lead [CG]  0100 Sheran Fava for updates [phone]   [CG]    Clinical Course User Index [CG] Kinnie Feil, PA-C   77 yo male with significant 3v CAD s/p stents here for light-headedness, increased dyspepsia, belching and sharp chest pain after full HD treatment.  He has h/o GI bleed.  Colonoscopy Aug 2019 found 2 non bleeding colonic angiodysplastic lesions treated with APC, likely source of blood loss anemia as well as diverticulosis. Upper EGD normal. On zantac.  Per cardiology belching and GI symptom equivalent to ischemia especially after HD and likely episodes of hypotension.   Lab work, EKG, CXR reviewed remarkable for stable hgb 11.1, creatinine 4.2  at baseline, K 3.2. Troponin 0.02> 0.00. No EKG changes. Dyspepsia and CP slightly improved in ER.  Hemoccult negative for blood. Other than mild epigastric tenderness, abdomen exam benign. Will give protonix PO. Given high risk and symptomatology, cardiology consult pending.   Final Clinical Impressions(s) / ED Diagnoses   0123: Spoke to cardiology fellow  Dr [place] recommends  admission to medicine service for observation and serial troponins. Cardiology consult in AM.  Final diagnoses:  None    ED Discharge Orders    None        Kinnie Feil, PA-C 07/25/17 0134    Deno Etienne, DO 07/25/17 [phone]

## 2017-07-24 NOTE — ED Triage Notes (Signed)
Pt c/o CP after dialysis today. Pt states that CP is central with no radiation. Pt was able to take full treatment today.

## 2017-07-25 ENCOUNTER — Observation Stay (HOSPITAL_COMMUNITY): Payer: Medicare Other

## 2017-07-25 ENCOUNTER — Observation Stay (HOSPITAL_BASED_OUTPATIENT_CLINIC_OR_DEPARTMENT_OTHER): Payer: Medicare Other

## 2017-07-25 ENCOUNTER — Encounter (HOSPITAL_COMMUNITY): Payer: Self-pay | Admitting: Internal Medicine

## 2017-07-25 DIAGNOSIS — N186 End stage renal disease: Secondary | ICD-10-CM | POA: Diagnosis not present

## 2017-07-25 DIAGNOSIS — I10 Essential (primary) hypertension: Secondary | ICD-10-CM | POA: Diagnosis not present

## 2017-07-25 DIAGNOSIS — I2510 Atherosclerotic heart disease of native coronary artery without angina pectoris: Secondary | ICD-10-CM | POA: Diagnosis not present

## 2017-07-25 DIAGNOSIS — R079 Chest pain, unspecified: Secondary | ICD-10-CM | POA: Diagnosis not present

## 2017-07-25 DIAGNOSIS — I2584 Coronary atherosclerosis due to calcified coronary lesion: Secondary | ICD-10-CM | POA: Diagnosis not present

## 2017-07-25 DIAGNOSIS — R112 Nausea with vomiting, unspecified: Secondary | ICD-10-CM | POA: Diagnosis not present

## 2017-07-25 DIAGNOSIS — I358 Other nonrheumatic aortic valve disorders: Secondary | ICD-10-CM | POA: Diagnosis not present

## 2017-07-25 DIAGNOSIS — E7800 Pure hypercholesterolemia, unspecified: Secondary | ICD-10-CM | POA: Diagnosis not present

## 2017-07-25 LAB — TYPE AND SCREEN
ABO/RH(D): O POS
ANTIBODY SCREEN: NEGATIVE

## 2017-07-25 LAB — CBC
HEMATOCRIT: 31.3 % — AB (ref 39.0–52.0)
HEMATOCRIT: 32.2 % — AB (ref 39.0–52.0)
HEMATOCRIT: 33.9 % — AB (ref 39.0–52.0)
HEMOGLOBIN: 10.5 g/dL — AB (ref 13.0–17.0)
Hemoglobin: 10.1 g/dL — ABNORMAL LOW (ref 13.0–17.0)
Hemoglobin: 11 g/dL — ABNORMAL LOW (ref 13.0–17.0)
MCH: 30.6 pg (ref 26.0–34.0)
MCH: 31.1 pg (ref 26.0–34.0)
MCH: 31.3 pg (ref 26.0–34.0)
MCHC: 32.3 g/dL (ref 30.0–36.0)
MCHC: 32.6 g/dL (ref 30.0–36.0)
MCHC: 32.4 g/dL (ref 30.0–36.0)
MCV: 94.8 fL (ref 78.0–100.0)
MCV: 95.3 fL (ref 78.0–100.0)
MCV: 96.6 fL (ref 78.0–100.0)
PLATELETS: 220 10*3/uL (ref 150–400)
PLATELETS: 237 10*3/uL (ref 150–400)
Platelets: 209 10*3/uL (ref 150–400)
RBC: 3.3 MIL/uL — ABNORMAL LOW (ref 4.22–5.81)
RBC: 3.38 MIL/uL — ABNORMAL LOW (ref 4.22–5.81)
RBC: 3.51 MIL/uL — ABNORMAL LOW (ref 4.22–5.81)
RDW: 18.6 % — AB (ref 11.5–15.5)
RDW: 18.9 % — AB (ref 11.5–15.5)
RDW: 19.2 % — AB (ref 11.5–15.5)
WBC: 8.8 10*3/uL (ref 4.0–10.5)
WBC: 8.3 10*3/uL (ref 4.0–10.5)
WBC: 9.5 10*3/uL (ref 4.0–10.5)

## 2017-07-25 LAB — HEPATIC FUNCTION PANEL
ALK PHOS: 76 U/L (ref 38–126)
ALT: 17 U/L (ref 17–63)
AST: 27 U/L (ref 15–41)
Albumin: 3.9 g/dL (ref 3.5–5.0)
BILIRUBIN DIRECT: 0.2 mg/dL (ref 0.1–0.5)
BILIRUBIN TOTAL: 1 mg/dL (ref 0.3–1.2)
Indirect Bilirubin: 0.8 mg/dL (ref 0.3–0.9)
Total Protein: 8.1 g/dL (ref 6.5–8.1)

## 2017-07-25 LAB — CREATININE, SERUM
Creatinine, Ser: 5.56 mg/dL — ABNORMAL HIGH (ref 0.61–1.24)
GFR, EST AFRICAN AMERICAN: 10 mL/min — AB (ref >60)
GFR, EST NON AFRICAN AMERICAN: 9 mL/min — AB (ref >60)

## 2017-07-25 LAB — POC OCCULT BLOOD, ED: Fecal Occult Bld: NEGATIVE

## 2017-07-25 LAB — I-STAT TROPONIN, ED: TROPONIN I, POC: 0 ng/mL (ref 0.00–0.08)

## 2017-07-25 LAB — LIPASE, BLOOD: Lipase: 49 U/L (ref 11–51)

## 2017-07-25 LAB — TROPONIN I: Troponin I: <0.03 ng/mL (ref <0.03)

## 2017-07-25 LAB — MRSA PCR SCREENING: MRSA by PCR: NEGATIVE

## 2017-07-25 LAB — ECHOCARDIOGRAM COMPLETE
Height: 66 in
Weight: 2832 oz

## 2017-07-25 MED ORDER — CALCITRIOL 0.25 MCG PO CAPS: 0.2500 ug | ORAL_CAPSULE | ORAL | Status: DC

## 2017-07-25 MED ORDER — FAMOTIDINE IN NACL 20-0.9 MG/50ML-% IV SOLN
20.0000 mg | Freq: Two times a day (BID) | INTRAVENOUS | Status: DC
  Administered 2017-07-25: 20 mg via INTRAVENOUS
  Filled 2017-07-25: qty 50

## 2017-07-25 MED ORDER — PANTOPRAZOLE SODIUM 40 MG PO TBEC: 40.0000 mg | DELAYED_RELEASE_TABLET | Freq: Every day | ORAL | Status: DC

## 2017-07-25 MED ORDER — PANTOPRAZOLE SODIUM 40 MG PO TBEC
40.0000 mg | DELAYED_RELEASE_TABLET | Freq: Two times a day (BID) | ORAL | Status: DC
Start: 2017-07-26 — End: 2017-07-27
  Administered 2017-07-27: 40 mg via ORAL
  Filled 2017-07-25: qty 1

## 2017-07-25 MED ORDER — ACETAMINOPHEN 325 MG PO TABS: 650.0000 mg | ORAL_TABLET | ORAL | Status: DC | PRN

## 2017-07-25 MED ORDER — ISOSORBIDE MONONITRATE ER 30 MG PO TB24
30.0000 mg | ORAL_TABLET | Freq: Every day | ORAL | Status: DC
  Administered 2017-07-25 – 2017-07-27 (×2): 30 mg via ORAL
  Filled 2017-07-25 (×2): qty 1

## 2017-07-25 MED ORDER — CLOPIDOGREL BISULFATE 75 MG PO TABS
75.0000 mg | ORAL_TABLET | Freq: Every day | ORAL | Status: DC
  Administered 2017-07-25 – 2017-07-27 (×2): 75 mg via ORAL
  Filled 2017-07-25 (×2): qty 1

## 2017-07-25 MED ORDER — AMLODIPINE BESYLATE 10 MG PO TABS
10.0000 mg | ORAL_TABLET | Freq: Every day | ORAL | Status: DC
  Administered 2017-07-25 – 2017-07-27 (×2): 10 mg via ORAL
  Filled 2017-07-25 (×2): qty 1

## 2017-07-25 MED ORDER — CARVEDILOL 6.25 MG PO TABS
6.2500 mg | ORAL_TABLET | Freq: Two times a day (BID) | ORAL | Status: DC
  Administered 2017-07-25 – 2017-07-27 (×2): 6.25 mg via ORAL
  Filled 2017-07-25 (×2): qty 1

## 2017-07-25 MED ORDER — ONDANSETRON HCL 4 MG/2ML IJ SOLN: 4.0000 mg | Freq: Four times a day (QID) | INTRAMUSCULAR | Status: DC | PRN

## 2017-07-25 MED ORDER — HEPARIN SODIUM (PORCINE) 5000 UNIT/ML IJ SOLN
5000.0000 [IU] | Freq: Three times a day (TID) | INTRAMUSCULAR | Status: DC
  Administered 2017-07-25 – 2017-07-27 (×4): 5000 [IU] via SUBCUTANEOUS
  Filled 2017-07-25 (×4): qty 1

## 2017-07-25 MED ORDER — FERRIC CITRATE 1 GM 210 MG(FE) PO TABS
210.0000 mg | ORAL_TABLET | Freq: Two times a day (BID) | ORAL | Status: DC
  Administered 2017-07-25 – 2017-07-27 (×2): 210 mg via ORAL
  Filled 2017-07-25 (×6): qty 1

## 2017-07-25 MED ORDER — ALLOPURINOL 100 MG PO TABS
100.0000 mg | ORAL_TABLET | Freq: Every day | ORAL | Status: DC
  Administered 2017-07-25 – 2017-07-27 (×2): 100 mg via ORAL
  Filled 2017-07-25 (×3): qty 1

## 2017-07-25 MED ORDER — METOCLOPRAMIDE HCL 5 MG PO TABS
5.0000 mg | ORAL_TABLET | Freq: Three times a day (TID) | ORAL | Status: DC
  Administered 2017-07-25: 5 mg via ORAL
  Filled 2017-07-25: qty 1

## 2017-07-25 MED ORDER — ATORVASTATIN CALCIUM 80 MG PO TABS
80.0000 mg | ORAL_TABLET | Freq: Every day | ORAL | Status: DC
  Administered 2017-07-25: 80 mg via ORAL
  Filled 2017-07-25 (×3): qty 1

## 2017-07-25 NOTE — ED Notes (Signed)
Attempted blood draw x2, attempts unsuccessful.

## 2017-07-25 NOTE — ED Notes (Signed)
The pts daughter has been upset she she arrived in the  Room with the pt.  She reports no doctor had seen her father   But the pa has been in [place].  Daughter c/o about the wait she reports that she has feet and ankle swelling and she is tired.  Demanding to see the pa again.

## 2017-07-25 NOTE — ED Notes (Signed)
Attempted to call report to [facility] [address] x 1. Left name and phone number for return call to give report.

## 2017-07-25 NOTE — ED Notes (Signed)
Phlebotomy at bedside at this time.

## 2017-07-25 NOTE — ED Notes (Signed)
Renal diet with 1200 Fluid restriction called to Nutritional Services for breakfast.

## 2017-07-25 NOTE — H&P (Signed)
History and Physical    Roger [name]:[id number] DOB: February 17, 1941 DOA: 07/24/2017  PCP: Lujean Amel, MD  Patient coming from: Home.  Chief Complaint: Chest pain and belching.  HPI: Roger [name] is a 77 y.o. male with history of CAD status post CABG and stenting last stenting in September 2018, history of GI bleed in August 2018 with unstable angina in the setting of GI bleed, ESRD on hemodialysis on Tuesday Thursdays and Saturday started developing chest pain with belching after dialysis yesterday.  Patient's chest pain was associated with belching otherwise chest pain-free.  Denies any associated shortness of breath productive cough fever or chills.  2 days ago patient had some dark vomitus.  Has not had any further episodes after that.  Denies any abdominal pain or diarrhea.  ED Course: In the ER patient's EKG was showing nonspecific findings troponin was negative chest x-ray unremarkable.  Abdomen appears benign.  Hemoglobin remained stable stool for occult blood was negative.  Patient admitted for chest pain which at this time looks atypical but given history of CAD would like to rule out ACS.  Review of Systems: As per HPI, rest all negative.   Past Medical History:  Diagnosis Date  . Anemia   . Anginal pain ([place])   . Arthritis    "all over; bad in the legs" (12/04/2016)  . CAD (coronary artery disease)    a. NSTEMI in setting of anemia; b. 10/2016 MV: inflat ST dep with large, reversible ant, apical, inflat defect; c. 10/2016 Cath: LM nl, LAD 40p, 48m, D1 70, D2 80, RI min irregs, LCX 100ost, RCA 100p, 151m/d, RPDA fills via collats from LAD, RPAV small-->Initial conservative Rx in setting of GIB w/ plan for PCI LAD if H/H stable on ASA/Plavix;  d. 12/04/16 s/p PTCA and DES to mLAD.  Marland Kitchen Depression    situational   . Diastolic dysfunction    a. 10/2016 Echo: EF 55-60%, no rwma, Gr2 DD, triv MR, mildly dil LA.  Marland Kitchen ESRD (end stage renal disease) ([place])    a. 10/2016 HD catheter  insterted and HD initiated; b. Pending permanent HD access folllowing Cardiac Revascularization.  . ESRD on dialysis [facility])    "[place]; [address]; TTS usually; going to [facility] in [place], [place] right now while staying w/daughter" (12/04/2016)  . GERD (gastroesophageal reflux disease)   . GIB (gastrointestinal bleeding) 10/2011   a. 10/2016 3u PRBCs - EGD/Colonoscopy: angiodysplasia w/ diverticulosis. No apparent bleeding. Polypecotmy->tubular adenomas.  . Gout   . Headache    no migraines for years  . Heart murmur   . High cholesterol   . History of blood transfusion 10/18/2016   "related to anemia" (12/04/2016)  . History of kidney stones    years ago  . Hypertension   . Iron deficiency anemia   . Myocardial infarction ([place]) 10/18/2016  . Old MI (myocardial infarction)    "found evidence of this on 10/18/2016"  . PAF (paroxysmal atrial fibrillation) ([place])   . Peripheral vascular disease ([place])    BLE  . Prostate cancer ([place])    a. s/p seed implantation.  . Tobacco abuse     Past Surgical History:  Procedure Laterality Date  . AV FISTULA PLACEMENT Right 03/28/2017   Procedure: ARTERIOVENOUS (AV) BRACHIOCEPHALIC FISTULA CREATION;  Surgeon: Angelia Mould, MD;  Location: [facility];  Service: Vascular;  Laterality: Right;  . CARDIAC CATHETERIZATION  10/2016  . COLONOSCOPY WITH PROPOFOL N/A 10/21/2016   Procedure: COLONOSCOPY WITH  PROPOFOL;  Surgeon: Gatha Mayer, MD;  Location: [facility] ENDOSCOPY;  Service: Endoscopy;  Laterality: N/A;  . CORONARY ANGIOPLASTY WITH STENT PLACEMENT  12/04/2016   "1 stent"  . CORONARY STENT INTERVENTION N/A 12/04/2016   Procedure: CORONARY STENT INTERVENTION;  Surgeon: Wellington Hampshire, MD;  Location: [place] CV LAB;  Service: Cardiovascular;  Laterality: N/A;  . ESOPHAGOGASTRODUODENOSCOPY N/A 10/21/2016   Procedure: ESOPHAGOGASTRODUODENOSCOPY (EGD);  Surgeon: Gatha Mayer, MD;  Location: [facility] ENDOSCOPY;  Service: Endoscopy;  Laterality: N/A;  .  INSERTION OF DIALYSIS CATHETER Right 10/22/2016   Procedure: INSERTION OF TUNNELED DIALYSIS CATHETER;  Surgeon: Elam Dutch, MD;  Location: [facility];  Service: Vascular;  Laterality: Right;  . LEFT HEART CATH AND CORONARY ANGIOGRAPHY N/A 10/23/2016   Procedure: LEFT HEART CATH AND CORONARY ANGIOGRAPHY;  Surgeon: Wellington Hampshire, MD;  Location: [facility] CV LAB;  Service: Cardiovascular;  Laterality: N/A;  . LIGATION OF ARTERIOVENOUS  FISTULA Right 05/12/2017   Procedure: BANDING OF RIGHT UPPER ARM ARTERIOVENOUS  FISTULA USING 6MM X 10CM GORE TEX VASCULAR GRAFT;  Surgeon: Angelia Mould, MD;  Location: [facility];  Service: Vascular;  Laterality: Right;  . LIGATION OF ARTERIOVENOUS  FISTULA Right 07/03/2017   Procedure: LIGATION OF ARTERIOVENOUS  FISTULA RIGHT ARM;  Surgeon: Angelia Mould, MD;  Location: [facility];  Service: Vascular;  Laterality: Right;  . TONSILLECTOMY       reports that he quit smoking about 9 months ago. His smoking use included cigarettes. He has a 25.00 pack-year smoking history. He has never used smokeless tobacco. He reports that he does not drink alcohol or use drugs.  No Known Allergies  Family History  Problem Relation Age of Onset  . Cancer Mother   . Cancer Father   . Heart attack Father   . Diabetes Father   . Hypertension Father   . Cancer Sister   . Cancer Brother   . Diabetes Brother   . Hypertension Brother   . Hypertension Daughter     Prior to Admission medications   Medication Sig Start Date End Date Taking? Authorizing Provider  allopurinol (ZYLOPRIM) 100 MG tablet Take 1 tablet (100 mg total) by mouth daily. 10/26/16  Yes Florencia Reasons, MD  amLODipine (NORVASC) 10 MG tablet Take 1 tablet (10 mg total) by mouth daily. 11/26/16  Yes Theora Gianotti, NP  atorvastatin (LIPITOR) 80 MG tablet Take 1 tablet (80 mg total) by mouth daily at 6 PM. 11/26/16  Yes Theora Gianotti, NP  b complex vitamins tablet Take 1 tablet by mouth See  admin instructions. Given at dialysis   Yes [provider]  calcitRIOL (ROCALTROL) 0.25 MCG capsule Take 1 capsule (0.25 mcg total) by mouth Every Tuesday,Thursday,and Saturday with dialysis. 12/05/16  Yes Bhagat, Bhavinkumar, PA  carvedilol (COREG) 6.25 MG tablet Take 1 tablet (6.25 mg total) by mouth 2 (two) times daily with a meal. 04/25/17  Yes Wellington Hampshire, MD  clopidogrel (PLAVIX) 75 MG tablet Take 1 tablet (75 mg total) by mouth daily. 11/26/16  Yes Theora Gianotti, NP  ferric citrate (AURYXIA) 1 GM 210 MG(Fe) tablet Take 210 mg by mouth 2 (two) times daily.   Yes [provider]  isosorbide mononitrate (IMDUR) 30 MG 24 hr tablet Take 30 mg by mouth daily. 01/24/17  Yes [provider]  ranitidine (ZANTAC) 150 MG tablet Take 150 mg by mouth daily.    Yes [provider]  oxyCODONE (ROXICODONE) 5 MG  immediate release tablet Take 1 tablet (5 mg total) by mouth every 4 (four) hours as needed for severe pain. 07/03/17   Angelia Mould, MD    Physical Exam: Vitals:   07/25/17 0100 07/25/17 0130 07/25/17 0200 07/25/17 0230  BP: (!) 111/58 (!) 102/56 111/63 (!) 107/57  Pulse: 63 61 61 (!) 59  Resp: 16 19 (!) 21 18  Temp:      TempSrc:      SpO2: 99% 100% 99% 100%  Weight:      Height:          Constitutional: Moderately built and nourished. Vitals:   07/25/17 0100 07/25/17 0130 07/25/17 0200 07/25/17 0230  BP: (!) 111/58 (!) 102/56 111/63 (!) 107/57  Pulse: 63 61 61 (!) 59  Resp: 16 19 (!) 21 18  Temp:      TempSrc:      SpO2: 99% 100% 99% 100%  Weight:      Height:       Eyes: Anicteric no pallor. ENMT: No discharge from the ears eyes nose or mouth. Neck: No mass felt.  No JVD appreciated. Respiratory: No rhonchi or crepitations. Cardiovascular: S1-S2 heard no murmurs appreciated. Abdomen: Soft nontender bowel sounds present. Musculoskeletal: No edema.  No joint effusion. Skin: No rash. Neurologic: Alert awake  oriented to time place and person.  Moves all extremities. Psychiatric: Appears normal.  Normal affect.   Labs on Admission: I have personally reviewed following labs and imaging studies  CBC: Recent Labs  Lab 07/24/17 1827 07/25/17 0150  WBC 10.3 8.8  HGB 11.1* 10.1*  HCT 34.1* 31.3*  MCV 95.3 94.8  PLT 242 564   Basic Metabolic Panel: Recent Labs  Lab 07/24/17 1827  NA 136  K 3.2*  CL 91*  CO2 30  GLUCOSE 104*  BUN 9  CREATININE 4.20*  CALCIUM 8.9   GFR: Estimated Creatinine Clearance: 15.4 mL/min (A) (by C-G formula based on SCr of 4.2 mg/dL (H)). Liver Function Tests: Recent Labs  Lab 07/25/17 0150  AST 27  ALT 17  ALKPHOS 76  BILITOT 1.0  PROT 8.1  ALBUMIN 3.9   Recent Labs  Lab 07/25/17 0150  LIPASE 49   No results for input(s): AMMONIA in the last 168 hours. Coagulation Profile: No results for input(s): INR, PROTIME in the last 168 hours. Cardiac Enzymes: Recent Labs  Lab 07/25/17 0150  TROPONINI <0.03   BNP (last 3 results) No results for input(s): PROBNP in the last 8760 hours. HbA1C: No results for input(s): HGBA1C in the last 72 hours. CBG: No results for input(s): GLUCAP in the last 168 hours. Lipid Profile: No results for input(s): CHOL, HDL, LDLCALC, TRIG, CHOLHDL, LDLDIRECT in the last 72 hours. Thyroid Function Tests: No results for input(s): TSH, T4TOTAL, FREET4, T3FREE, THYROIDAB in the last 72 hours. Anemia Panel: No results for input(s): VITAMINB12, FOLATE, FERRITIN, TIBC, IRON, RETICCTPCT in the last 72 hours. Urine analysis:    Component Value Date/Time   COLORURINE YELLOW 10/20/2016 0255   APPEARANCEUR CLEAR 10/20/2016 0255   LABSPEC 1.010 10/20/2016 0255   LABSPEC 1.020 12/26/2009 1533   PHURINE 6.0 10/20/2016 0255   GLUCOSEU NEGATIVE 10/20/2016 0255   HGBUR NEGATIVE 10/20/2016 0255   BILIRUBINUR NEGATIVE 10/20/2016 0255   BILIRUBINUR Negative 12/26/2009 [place] 10/20/2016 0255   PROTEINUR  NEGATIVE 10/20/2016 0255   NITRITE NEGATIVE 10/20/2016 0255   LEUKOCYTESUR NEGATIVE 10/20/2016 0255   LEUKOCYTESUR Negative 12/26/2009 1533   Sepsis Labs: @LABRCNTIP (procalcitonin:4,lacticidven:4) )  No results found for this or any previous visit (from the past 240 hour(s)).   Radiological Exams on Admission: Dg Chest 2 View  Result Date: 07/24/2017 CLINICAL DATA:  Central chest pain. EXAM: CHEST - 2 VIEW COMPARISON:  10/22/2016 FINDINGS: AP and lateral views of the chest show no focal airspace consolidation. No pulmonary edema or pleural effusion. The cardiopericardial silhouette is within normal limits for size. Right IJ dialysis catheter tip overlies the SVC/RA junction. The visualized bony structures of the thorax are intact. IMPRESSION: No active cardiopulmonary disease. Electronically Signed   By: Misty Stanley M.D.   On: 07/24/2017 19:07   Dg Abd 1 View  Result Date: 07/25/2017 CLINICAL DATA:  77 year old male with nausea vomiting. EXAM: ABDOMEN - 1 VIEW COMPARISON:  Abdominal CT dated 11/18/2016 FINDINGS: Evaluation is limited due to patient's body habitus. There is no bowel dilatation or evidence of obstruction. There is degenerative changes of the spine. No acute osseous pathology. Multiple metallic prostate clips. IMPRESSION: No bowel dilatation. Electronically Signed   By: Anner Crete M.D.   On: 07/25/2017 02:44    EKG: Independently reviewed.  Normal sinus rhythm with nonspecific ST-T changes.  Assessment/Plan Principal Problem:   Chest pain Active Problems:   Peripheral vascular disease, unspecified ([place])   Lower GI bleed   Angiodysplasia of cecum   CAD (coronary artery disease)   ESRD (end stage renal disease) ([place])    1. Chest pain -appears atypical happens along with belching.  Presently chest pain-free.  Denies any shortness of breath.  We will cycle cardiac markers continue antiplatelet agents, statins Coreg check 2D echo.  May consult cardiology in the  morning. 2. ESRD on hemodialysis on Tuesday Thursdays and Saturday.  Had dialysis yesterday.  Consult nephrology for dialysis. 3. Hypertension on amlodipine and Coreg. 4. Anemia likely from ESRD.  Has had previous history of GI bleed.  Will check serial CBCs since patient has had previous episode of GI bleed in August 2018.  On Pepcid IV for now.  GI bleed was probably from the cecal angiodysplasia EGD at the time was negative. 5. History of gout on allopurinol.   DVT prophylaxis: Heparin. Code Status: Full code. Family Communication: Discussed with patient. Disposition Plan: Home. Consults called: None. Admission status: Observation.   Rise Patience MD [facility] Pager [phone].  If 7PM-7AM, please contact night-coverage [email or web address] Password [email or web address]  07/25/2017, 3:03 AM

## 2017-07-25 NOTE — Progress Notes (Signed)
  Echocardiogram 2D Echocardiogram has been performed.  Roger [name] 07/25/2017, 9:44 AM

## 2017-07-25 NOTE — Consult Note (Addendum)
Cardiology Consultation:   Patient ID: Roger [name]; [id number]; January 11, 1941   Admit date: 07/24/2017 Date of Consult: 07/25/2017  Primary Care Provider: Lujean Amel, MD Primary Cardiologist: [name], MD   Patient Profile:   Roger [name] is a 77 y.o. male with a hx of CAD, PVD, Chronic diastolic HF, ESRD on HD, HTN, HLD and prior GIB,  who is being seen today for the evaluation of chest pain at the request of Dr. Thereasa Solo, Internal Medicine. 2D echo noted concerns for aortic and mitral valve vegetations.   History of Present Illness:   Roger [name] is followed by Dr. Fletcher Anon and has h/o CAD, PVD, chronic diastolic HF, ESRD on HD T,TH,Sa, HTN, HLD and h/o GIB. He had unstable angina in August in the setting of GI bleed. Cardiac catheterization was done which showed significant three-vessel coronary artery disease with chronically occluded right coronary artery with left-to-right collaterals, chronically occluded proximal left circumflex which seem to be a small vessel with left to left collaterals and 70% stenosis in the mid LAD. LVEDP was moderately elevated. EF was normal by echo.  No good targets for CABG except in the LAD territory.  The patient underwent staged LAD PCI with drug-eluting stent placement on December 04, 2017.    He presented to [facility] on 07/24[facility]17 for chest pain, which started shortly HD. It was sharp in nature. Denies pressure/tightness/ heaviness.  No SOB, cough or chills. He has had frequent belching. Pain typically relieved with zantac but no longer working as well. He is currently CP free. No recurrence. EKG shows NSR w/ 1st degree AVB and Nonspecific ST and T wave abnormality. Troponins are negative x 3. Echo shows normal LVEF, 60-65%, G1DD. There appears to be vegetations on both the aortic and mitral valves, suspicious for endocarditis (see full echo report below). No significant regurgitation of either of the valves. He is afebrile and WBC WNL. TEE recommended.      Past Medical History:  Diagnosis Date  . Anemia   . Anginal pain ([place])   . Arthritis    "all over; bad in the legs" (12/04/2016)  . CAD (coronary artery disease)    a. NSTEMI in setting of anemia; b. 10/2016 MV: inflat ST dep with large, reversible ant, apical, inflat defect; c. 10/2016 Cath: LM nl, LAD 40p, 70m D1 70, D2 80, RI min irregs, LCX 100ost, RCA 100p, 106m, RPDA fills via collats from LAD, RPAV small-->Initial conservative Rx in setting of GIB w/ plan for PCI LAD if H/H stable on ASA/Plavix;  d. 12/04/16 s/p PTCA and DES to mLAD.  . Marland Kitchenepression    situational   . Diastolic dysfunction    a. 10/2016 Echo: EF 55-60%, no rwma, Gr2 DD, triv MR, mildly dil LA.  . Mar[place] KitchenSRD (end stage renal disease) [place]   a. 10/2016 HD catheter insterted and HD initiated; b. Pending permanent HD access folllowing Cardiac Revascularization.  . [facility]; [address]; T[place] usually; [place] [facility] [place] staying w/daughter" (12/04/2016)  . GERD (gastroesophageal reflux disease)   . GIB (gastrointestinal bleeding) 10/2011   a. 10/2016 3u PRBCs - EGD/Colonoscopy: angiodysplasia w/ diverticulosis. No apparent bleeding. Polypecotmy->tubular adenomas.  . Gout   . Headache    no migraines for years  . Heart murmur   . High cholesterol   . History of blood transfusion 10/18/2016   "related to anemia" (12/04/2016)  . History of kidney stones  years ago  . Hypertension   . Iron deficiency anemia   . Myocardial infarction ([place]) 10/18/2016  . Old MI (myocardial infarction)    "found evidence of this on 10/18/2016"  . PAF (paroxysmal atrial fibrillation) ([place])   . Peripheral vascular disease ([place])    BLE  . Prostate cancer ([place])    a. s/p seed implantation.  . Tobacco abuse     Past Surgical History:  Procedure Laterality Date  . AV FISTULA PLACEMENT Right 03/28/2017   Procedure: ARTERIOVENOUS (AV) BRACHIOCEPHALIC FISTULA CREATION;  Surgeon: Angelia Mould, MD;  Location: [place];  Service: Vascular;  Laterality: Right;  . CARDIAC CATHETERIZATION  10/2016  . COLONOSCOPY WITH PROPOFOL N/A 10/21/2016   Procedure: COLONOSCOPY WITH PROPOFOL;  Surgeon: Gatha Mayer, MD;  Location[facility] ENDOSCOPY;  Service: Endoscopy;  Laterality: N/A;  . CORONARY ANGIOPLASTY WITH STENT PLACEMENT  12/04/2016   "1 stent"  . CORONARY STENT INTERVENTION N/A 12/04/2016   Procedure: CORONARY STENT INTERVENTION;  Surgeon: Wellington Hampshire, MD;  Location: [place] CV LAB;  Service: Cardiovascular;  Laterality: N/A;  . ESOPHAGOGASTRODUODENOSCOPY N/A 10/21/2016   Procedure: ESOPHAGOGASTRODUODENOSCOPY (EGD);  Surgeon: Gatha Mayer, MD;  Location: [facility] ENDOSCOPY;  Service: Endoscopy;  Laterality: N/A;  . INSERTION OF DIALYSIS CATHETER Right 10/22/2016   Procedure: INSERTION OF TUNNELED DIALYSIS CATHETER;  Surgeon: Elam Dutch, MD;  Location: [place];  Service: Vascular;  Laterality: Right;  . LEFT HEART CATH AND CORONARY ANGIOGRAPHY N/A 10/23/2016   Procedure: LEFT HEART CATH AND CORONARY ANGIOGRAPHY;  Surgeon: Wellington Hampshire, MD;  Location: [place] CV LAB;  Service: Cardiovascular;  Laterality: N/A;  . LIGATION OF ARTERIOVENOUS  FISTULA Right 05/12/2017   Procedure: BANDING OF RIGHT UPPER ARM ARTERIOVENOUS  FISTULA USING 6MM X 10CM GORE TEX VASCULAR GRAFT;  Surgeon: Angelia Mould, MD;  Location: [place];  Service: Vascular;  Laterality: Right;  . LIGATION OF ARTERIOVENOUS  FISTULA Right 07/03/2017   Procedure: LIGATION OF ARTERIOVENOUS  FISTULA RIGHT ARM;  Surgeon: Angelia Mould, MD;  Location: [place];  Service: Vascular;  Laterality: Right;  . TONSILLECTOMY       Home Medications:  Prior to Admission medications   Medication Sig Start Date End Date Taking? Authorizing Provider  allopurinol (ZYLOPRIM) 100 MG tablet Take 1 tablet (100 mg total) by mouth daily. 10/26/16  Yes Florencia Reasons, MD  amLODipine (NORVASC) 10 MG tablet Take 1 tablet (10 mg  total) by mouth daily. 11/26/16  Yes Theora Gianotti, NP  atorvastatin (LIPITOR) 80 MG tablet Take 1 tablet (80 mg total) by mouth daily at 6 PM. 11/26/16  Yes Theora Gianotti, NP  b complex vitamins tablet Take 1 tablet by mouth See admin instructions. Given at dialysis   Yes [provider]  calcitRIOL (ROCALTROL) 0.25 MCG capsule Take 1 capsule (0.25 mcg total) by mouth Every Tuesday,Thursday,and Saturday with dialysis. 12/05/16  Yes Bhagat, Bhavinkumar, PA  carvedilol (COREG) 6.25 MG tablet Take 1 tablet (6.25 mg total) by mouth 2 (two) times daily with a meal. 04/25/17  Yes Wellington Hampshire, MD  clopidogrel (PLAVIX) 75 MG tablet Take 1 tablet (75 mg total) by mouth daily. 11/26/16  Yes Theora Gianotti, NP  ferric citrate (AURYXIA) 1 GM 210 MG(Fe) tablet Take 210 mg by mouth 2 (two) times daily.   Yes [provider]  isosorbide mononitrate (IMDUR) 30 MG 24 hr tablet Take 30 mg by mouth daily. 01/24/17  Yes [provider]  ranitidine (ZANTAC) 150 MG tablet Take 150 mg by mouth daily.    Yes [provider]  oxyCODONE (ROXICODONE) 5 MG immediate release tablet Take 1 tablet (5 mg total) by mouth every 4 (four) hours as needed for severe pain. 07/03/17   Angelia Mould, MD    Inpatient Medications: Scheduled Meds: . allopurinol  100 mg Oral Daily  . amLODipine  10 mg Oral Daily  . atorvastatin  80 mg Oral q1800  . [START ON 07/26/2017] calcitRIOL  0.25 mcg Oral Q T,Th,Sa-HD  . carvedilol  6.25 mg Oral BID WC  . clopidogrel  75 mg Oral Daily  . ferric citrate  210 mg Oral BID WC  . heparin  5,000 Units Subcutaneous Q8H  . isosorbide mononitrate  30 mg Oral Daily  . metoCLOPramide  5 mg Oral TID AC & HS  . [START ON 07/26/2017] pantoprazole  40 mg Oral BID AC   Continuous Infusions:  PRN Meds: acetaminophen, ondansetron (ZOFRAN) IV  Allergies:   No Known Allergies  Social History:   Social History   Socioeconomic  History  . Marital status: Widowed    Spouse name: Not on file  . Number of children: Not on file  . Years of education: Not on file  . Highest education level: Not on file  Occupational History  . Not on file  Social Needs  . Financial resource strain: Not on file  . Food insecurity:    Worry: Not on file    Inability: Not on file  . Transportation needs:    Medical: Not on file    Non-medical: Not on file  Tobacco Use  . Smoking status: Former Smoker    Packs/day: 0.50    Years: 50.00    Pack years: 25.00    Types: Cigarettes    Last attempt to quit: 10/18/2016    Years since quitting: 0.7  . Smokeless tobacco: Never Used  Substance and Sexual Activity  . Alcohol use: No  . Drug use: No  . Sexual activity: Never    Birth control/protection: Abstinence  Lifestyle  . Physical activity:    Days per week: Not on file    Minutes per session: Not on file  . Stress: Not on file  Relationships  . Social connections:    Talks on phone: Not on file    Gets together: Not on file    Attends religious service: Not on file    Active member of club or organization: Not on file    Attends meetings of clubs or organizations: Not on file    Relationship status: Not on file  . Intimate partner violence:    Fear of current or ex partner: Not on file    Emotionally abused: Not on file    Physically abused: Not on file    Forced sexual activity: Not on file  Other Topics Concern  . Not on file  Social History Narrative  . Not on file    Family History:    Family History  Problem Relation Age of Onset  . Cancer Mother   . Cancer Father   . Heart attack Father   . Diabetes Father   . Hypertension Father   . Cancer Sister   . Cancer Brother   . Diabetes Brother   . Hypertension Brother   . Hypertension Daughter      ROS:  Please see the history of present illness.   All other ROS reviewed  and negative.     Physical Exam/Data:   Vitals:   07/25/17 0930 07/25/17 1000  07/25/17 1030 07/25/17 1115  BP: 118/62 110/62 118/63 115/69  Pulse: (!) 59  (!) 57 60  Resp: 10 15 (!) 8   Temp:    98.4 F (36.9 C)  TempSrc:    Oral  SpO2: 100%   100%  Weight:      Height:       No intake or output data in the 24 hours ending 07/25/17 1645 Filed Weights   07/24/17 1814  Weight: 177 lb (80.3 kg)   Body mass index is 28.57 kg/m.  General:  Thin appearing, dentition in poor repair, in no acute distress HEENT: normal Lymph: no adenopathy Neck: no JVD Endocrine:  No thryomegaly Vascular: No carotid bruits; FA pulses 2+ bilaterally without bruits  Cardiac:  normal S1, S2; RRR; no murmur  Lungs:  clear to auscultation bilaterally, no wheezing, rhonchi or rales  Abd: soft, nontender, no hepatomegaly  Ext: no edema Musculoskeletal:  No deformities, BUE and BLE strength normal and equal Skin: warm and dry  Neuro:  CNs 2-12 intact, no focal abnormalities noted Psych:  Normal affect   EKG:  The EKG was personally reviewed and demonstrates:  NSR nonspecific Twave abnormalities  Telemetry:  Telemetry was personally reviewed and demonstrates:  NSR  Relevant CV Studies: [facility] 10/2016 Procedures   LEFT HEART CATH AND CORONARY ANGIOGRAPHY  Conclusion     Ost Cx to Prox Cx lesion, 100 %stenosed.  Prox RCA lesion, 100 %stenosed.  Prox LAD lesion, 40 %stenosed.  Mid LAD lesion, 70 %stenosed.  Ost 1st Diag to 1st Diag lesion, 70 %stenosed.  Ost 2nd Diag to 2nd Diag lesion, 80 %stenosed.  Mid RCA to Dist RCA lesion, 100 %stenosed.   1. Significant three-vessel coronary artery disease. Chronically occluded right coronary artery with left to right collaterals supplying the right PDA. Chronically occluded proximal left circumflex which seems to be a small vessel overall with some left to left collaterals. Most of the lateral wall seems to be supplied by a large ramus branch. The LAD has a discrete 70% stenosis in a tortuous midsegment . The coronary arteries are  overall moderately calcified and extremely tortuous likely due to hypertensive heart disease.  2. Moderately elevated left ventricular end-diastolic pressure. Left ventricular angiography was not performed due to kidney disease. EF was normal by echo.    Coronary Intervention 11/2016 Successful angioplasty and drug-eluting stent placement to the mid LAD.  2D Echo 07/25/17 Study Conclusions  - Procedure narrative: Transthoracic echocardiography. Image   quality was suboptimal. Technically difficult study. - Left ventricle: The cavity size was normal. Wall thickness was   increased in a pattern of mild LVH. Systolic function was normal.   The estimated ejection fraction was in the range of 60% to 65%.   Wall motion was normal; there were no regional wall motion   abnormalities. Doppler parameters are consistent with abnormal   left ventricular relaxation (grade 1 diastolic dysfunction). The   E/e&' ratio is between 8-15, suggesting indeterminate LV filling   pressure. - Aortic valve: Thickening and increased echogenicity of the   leaflet tips, consistent with calcification +/- endocarditis.   There is also a mass, more clearly noted in off-axis views of the   valve - this seems to move with the leaflet and likely is a   vegetation given the clinical scenario, although there is no   significant stenosis - which  would be expected given some of the   measurements obtained. Trivial regurgitation. Further evaluation   with TEE is recommended. - Mitral valve: MAC with thickened leaflets. There is a mobile   structure associated with the subvalvular apparatus in the LV,   which may be a partially flail cord. There is a mass on the   anterior leaflet which likely represents endocarditis. Further   evaluation with TEE is recommended. There is mild regurgitation.   There was mild regurgitation. - Left atrium: The atrium was mildly dilated. - Inferior vena cava: The vessel was normal in size.  The   respirophasic diameter changes were in the normal range (= 50%),   consistent with normal central venous pressure.  Impressions:  - Technically difficult study, although extensive effort to   visualize aortic and mitral valves. There are likely vegetations   of both valves given the clinical scenario, consistent with   endocarditis. LVEF 60-65%. TEE is recommended to confirm the   diagnosis. There may be a partially flail subvalvular cord of the   mitral apparatus.   Laboratory Data:  Chemistry Recent Labs  Lab 07/24/17 1827 07/25/17 0319  NA 136  --   K 3.2*  --   CL 91*  --   CO2 30  --   GLUCOSE 104*  --   BUN 9  --   CREATININE 4.20* 5.56*  CALCIUM 8.9  --   GFRNONAA 13* 9*  GFRAA 15* 10*  ANIONGAP 15  --     Recent Labs  Lab 07/25/17 0150  PROT 8.1  ALBUMIN 3.9  AST 27  ALT 17  ALKPHOS 76  BILITOT 1.0   Hematology Recent Labs  Lab 07/25/17 0150 07/25/17 0319 07/25/17 1125  WBC 8.8 8.3 9.5  RBC 3.30* 3.38* 3.51*  HGB 10.1* 10.5* 11.0*  HCT 31.3* 32.2* 33.9*  MCV 94.8 95.3 96.6  MCH 30.6 31.1 31.3  MCHC 32.3 32.6 32.4  RDW 18.6* 18.9* 19.2*  PLT 220 209 237   Cardiac Enzymes Recent Labs  Lab 07/25/17 0150 07/25/17 0742 07/25/17 1356  TROPONINI <0.03 <0.03 <0.03    Recent Labs  Lab 07/24/17 1831 07/24/17 2358  TROPIPOC 0.02 0.00    BNPNo results for input(s): BNP, PROBNP in the last 168 hours.  DDimer No results for input(s): DDIMER in the last 168 hours.  Radiology/Studies:  Dg Chest 2 View  Result Date: 07/24/2017 CLINICAL DATA:  Central chest pain. EXAM: CHEST - 2 VIEW COMPARISON:  10/22/2016 FINDINGS: AP and lateral views of the chest show no focal airspace consolidation. No pulmonary edema or pleural effusion. The cardiopericardial silhouette is within normal limits for size. Right IJ dialysis catheter tip overlies the SVC/RA junction. The visualized bony structures of the thorax are intact. IMPRESSION: No active  cardiopulmonary disease. Electronically Signed   By: Misty Stanley M.D.   On: 07/24/2017 19:07   Dg Abd 1 View  Result Date: 07/25/2017 CLINICAL DATA:  77 year old male with nausea vomiting. EXAM: ABDOMEN - 1 VIEW COMPARISON:  Abdominal CT dated 11/18/2016 FINDINGS: Evaluation is limited due to patient's body habitus. There is no bowel dilatation or evidence of obstruction. There is degenerative changes of the spine. No acute osseous pathology. Multiple metallic prostate clips. IMPRESSION: No bowel dilatation. Electronically Signed   By: Anner Crete M.D.   On: 07/25/2017 02:44    Assessment and Plan:   DONTEE JASO is a 77 y.o. male with a hx of CAD, PVD, Chronic diastolic HF,  ESRD on HD, HTN, HLD and prior GIB,  who is being seen today for the evaluation of chest pain at the request of Dr. Thereasa Solo, Internal Medicine.  1. Chest Pain: His recent CP is atypical and more c/w GI etiology. No objective signs of ischemia. Troponins are negative x 3. Echo with normal LVEF and wall motion. However given known CAD, we will plan NST in the am. Will make NPO at midnight.  2. Echo: Echo report read possible vegitations on both the aortic and mitral valves, suspicious for endocarditis (see full echo report above). However, echo images were reviewed by Dr. Radford Pax, and we suspect no vegetations and likely calcification. He is afebrile and WBC WNL. Would not purse TEE unless he has positive blood cultures. Will defer further w/u with blood cultures to primary team.   3. CAD: h/o LAD PCI in Sep 2018. There was residual mild to moderate disease as outlined in cath report above. He also has chronically occluded RCA with collaterals on prior cath. Recent CP atypical. Troponins negative x 3. Now CP free. However given known residual CAD, we will plan NST in the am. Will make NPO at midnight. Continue medical management.   For questions or updates, please contact [place] Please consult [email or web address] for  contact info under Cardiology/STEMI.   Signed, Lyda Jester, PA-C  07/25/2017 4:45 PM

## 2017-07-25 NOTE — ED Notes (Signed)
Pt asleep.

## 2017-07-25 NOTE — ED Notes (Signed)
ECHO at bedside at this time.  

## 2017-07-25 NOTE — Progress Notes (Signed)
Roger [name] TEAM 1 - Stepdown/ICU TEAM  Roger [name]  MOQ:[id number] DOB: 15-Jan-1941 DOA: 07/24/2017 PCP: Lujean Amel, MD    Brief Narrative:  77 y.o. male with a hx of CAD status post CABG and stenting w/ last stenting in September 2018, GI bleed in August 2018, and ESRD on hemodialysis on Tuesday Thursdays and Saturday who developed chest pain with belching after dialysis 07/24/17.  He denied associated shortness of breath productive cough fever or chills.  Two days prior the patient reported some self-limited dark vomitus.    In the ER his EKG was nonspecific, troponin was negative, and chest x-ray was unremarkable.   Significant Events: 5/10 admit  5/10 TTE - EF 60-65% - no WMA - mild LVH - grade 1 DD - AoV mass c/w vegetation - MV mass c/w vegetation   Subjective: Pt seen for a f/u visit.   Assessment & Plan:  Chest pain appears atypical and likely GI in nature   ?Newly diagnosed Aortic and Mitral valve vegetations Cardiology consulted for TEE and further recs  ESRD on hemodialysis on Tuesday Thursdays and Saturday Nephrology consulted - no acute HD needs   Hypertension on amlodipine and Coreg  Anemia likely from ESRD previous history of GI bleed  Gout on allopurinol  DVT prophylaxis: SQ heparin  Code Status: FULL CODE Family Communication: spoke w/ daughter at bedside   Disposition Plan: SDU  Consultants:  Cardiology Nephrology   Antimicrobials:  none  Objective: Blood pressure 115/69, pulse 60, temperature 98.4 F (36.9 C), temperature source Oral, resp. rate (!) 8, height 5\' 6"  (1.676 m), weight 80.3 kg (177 lb), SpO2 100 %. No intake or output data in the 24 hours ending 07/25/17 1504 Filed Weights   07/24/17 1814  Weight: 80.3 kg (177 lb)    Examination: Pt seen for a f/u exam.    CBC: Recent Labs  Lab 07/25/17 0150 07/25/17 0319 07/25/17 1125  WBC 8.8 8.3 9.5  HGB 10.1* 10.5* 11.0*  HCT 31.3* 32.2* 33.9*  MCV 94.8 95.3 96.6  PLT 220  209 354   Basic Metabolic Panel: Recent Labs  Lab 07/24/17 1827 07/25/17 0319  NA 136  --   K 3.2*  --   CL 91*  --   CO2 30  --   GLUCOSE 104*  --   BUN 9  --   CREATININE 4.20* 5.56*  CALCIUM 8.9  --    GFR: Estimated Creatinine Clearance: 11.6 mL/min (A) (by C-G formula based on SCr of 5.56 mg/dL (H)).  Liver Function Tests: Recent Labs  Lab 07/25/17 0150  AST 27  ALT 17  ALKPHOS 76  BILITOT 1.0  PROT 8.1  ALBUMIN 3.9   Recent Labs  Lab 07/25/17 0150  LIPASE 49    Cardiac Enzymes: Recent Labs  Lab 07/25/17 0150 07/25/17 0742  TROPONINI <0.03 <0.03    HbA1C: Hgb A1c MFr Bld  Date/Time Value Ref Range Status  10/19/2016 06:37 AM 4.9 4.8 - 5.6 % Final    Comment:    (NOTE)         Pre-diabetes: 5.7 - 6.4         Diabetes: >6.4         Glycemic control for adults with diabetes: <7.0      Scheduled Meds: . allopurinol  100 mg Oral Daily  . amLODipine  10 mg Oral Daily  . atorvastatin  80 mg Oral q1800  . [START ON 07/26/2017] calcitRIOL  0.25 mcg Oral  Q T,Th,Sa-HD  . carvedilol  6.25 mg Oral BID WC  . clopidogrel  75 mg Oral Daily  . ferric citrate  210 mg Oral BID WC  . heparin  5,000 Units Subcutaneous Q8H  . isosorbide mononitrate  30 mg Oral Daily  . pantoprazole  40 mg Oral Daily     LOS: 0 days   Cherene Altes, MD [facility] Office  [phone] Pager - Text Page per Amion as per below:  On-Call/Text Page:      [email or web address]      password [email or web address]  If 7PM-7AM, please contact night-coverage [email or web address] Password [email or web address] 07/25/2017, 3:04 PM

## 2017-07-26 ENCOUNTER — Observation Stay (HOSPITAL_BASED_OUTPATIENT_CLINIC_OR_DEPARTMENT_OTHER): Payer: Medicare Other

## 2017-07-26 DIAGNOSIS — I208 Other forms of angina pectoris: Secondary | ICD-10-CM | POA: Diagnosis not present

## 2017-07-26 DIAGNOSIS — R079 Chest pain, unspecified: Secondary | ICD-10-CM

## 2017-07-26 DIAGNOSIS — R112 Nausea with vomiting, unspecified: Secondary | ICD-10-CM | POA: Diagnosis not present

## 2017-07-26 DIAGNOSIS — R42 Dizziness and giddiness: Secondary | ICD-10-CM | POA: Diagnosis not present

## 2017-07-26 DIAGNOSIS — R142 Eructation: Secondary | ICD-10-CM | POA: Diagnosis not present

## 2017-07-26 DIAGNOSIS — I2510 Atherosclerotic heart disease of native coronary artery without angina pectoris: Secondary | ICD-10-CM | POA: Diagnosis not present

## 2017-07-26 LAB — NM MYOCAR MULTI W/SPECT W/WALL MOTION / EF
CHL CUP MPHR: 146 {beats}/min
CHL CUP RESTING HR STRESS: 54 {beats}/min
CSEPEDS: 0 s
CSEPEW: 1 METS
Exercise duration (min): 0 min
Peak HR: 71 {beats}/min
Percent HR: 48 %

## 2017-07-26 LAB — COMPREHENSIVE METABOLIC PANEL
ALBUMIN: 3.9 g/dL (ref 3.5–5.0)
ALT: 16 U/L — ABNORMAL LOW (ref 17–63)
ANION GAP: 17 — AB (ref 5–15)
AST: 25 U/L (ref 15–41)
Alkaline Phosphatase: 78 U/L (ref 38–126)
BILIRUBIN TOTAL: 0.7 mg/dL (ref 0.3–1.2)
BUN: 40 mg/dL — ABNORMAL HIGH (ref 6–20)
CALCIUM: 9.1 mg/dL (ref 8.9–10.3)
CO2: 29 mmol/L (ref 22–32)
CREATININE: 9.07 mg/dL — AB (ref 0.61–1.24)
Chloride: 89 mmol/L — ABNORMAL LOW (ref 101–111)
GFR calc non Af Amer: 5 mL/min — ABNORMAL LOW (ref >60)
GFR, EST AFRICAN AMERICAN: 6 mL/min — AB (ref >60)
Glucose, Bld: 93 mg/dL (ref 65–99)
Potassium: 4.1 mmol/L (ref 3.5–5.1)
Sodium: 135 mmol/L (ref 135–145)
TOTAL PROTEIN: 8.7 g/dL — AB (ref 6.5–8.1)

## 2017-07-26 LAB — CBC
HEMATOCRIT: 33.3 % — AB (ref 39.0–52.0)
Hemoglobin: 10.6 g/dL — ABNORMAL LOW (ref 13.0–17.0)
MCH: 30.5 pg (ref 26.0–34.0)
MCHC: 31.8 g/dL (ref 30.0–36.0)
MCV: 96 fL (ref 78.0–100.0)
Platelets: 234 10*3/uL (ref 150–400)
RBC: 3.47 MIL/uL — ABNORMAL LOW (ref 4.22–5.81)
RDW: 19.4 % — ABNORMAL HIGH (ref 11.5–15.5)
WBC: 8.1 10*3/uL (ref 4.0–10.5)

## 2017-07-26 MED ORDER — CALCITRIOL 0.25 MCG PO CAPS
ORAL_CAPSULE | ORAL | Status: AC
  Administered 2017-07-26: 0.25 ug
  Filled 2017-07-26: qty 1

## 2017-07-26 MED ORDER — CALCITRIOL 0.5 MCG PO CAPS
ORAL_CAPSULE | ORAL | Status: AC
  Filled 2017-07-26: qty 3

## 2017-07-26 MED ORDER — TECHNETIUM TC 99M TETROFOSMIN IV KIT
30.0000 | PACK | Freq: Once | INTRAVENOUS | Status: AC | PRN
  Administered 2017-07-26: 30 via INTRAVENOUS

## 2017-07-26 MED ORDER — REGADENOSON 0.4 MG/5ML IV SOLN
0.4000 mg | Freq: Once | INTRAVENOUS | Status: AC
Start: 2017-07-26 — End: 2017-07-26
  Administered 2017-07-26: 0.4 mg via INTRAVENOUS
  Filled 2017-07-26: qty 5

## 2017-07-26 MED ORDER — REGADENOSON 0.4 MG/5ML IV SOLN
INTRAVENOUS | Status: AC
  Filled 2017-07-26: qty 5

## 2017-07-26 MED ORDER — TECHNETIUM TC 99M TETROFOSMIN IV KIT
10.0000 | PACK | Freq: Once | INTRAVENOUS | Status: AC | PRN
  Administered 2017-07-26: 10 via INTRAVENOUS

## 2017-07-26 MED ORDER — CALCITRIOL 0.5 MCG PO CAPS
1.5000 ug | ORAL_CAPSULE | Freq: Every day | ORAL | Status: DC
  Administered 2017-07-26 – 2017-07-27 (×2): 1.5 ug via ORAL
  Filled 2017-07-26: qty 3

## 2017-07-26 NOTE — Care Management Obs Status (Signed)
[place] NOTIFICATION   Patient Details  Name: TIMM BONENBERGER MRN: [id number] Date of Birth: November 28, 1940   Medicare Observation Status Notification Given:  Yes    Bethena Roys, RN 07/26/2017, 2:13 PM

## 2017-07-26 NOTE — Progress Notes (Signed)
    Patient's stress test showed:    There was no ST segment deviation noted during stress.  No T wave inversion was noted during stress.  Findings consistent with prior myocardial infarction.  This is a low risk study.  The left ventricular ejection fraction is normal (55-65%).   Small inferior wall infarct at mid and basal level with no ischemia EF preserved at 56%  No plans for further ischemic eval at this time. Personally reviewed results with the patient as did Dr. Johnsie Cancel. Will make admitting team aware.   Signed, Erma Heritage, PA-C 07/26/2017, 1:13 PM Pager: [phone]

## 2017-07-26 NOTE — Progress Notes (Signed)
   Roger [name] presented for a 1-day Lexiscan cardiolite today.  No immediate complications.  Stress imaging is pending at this time. Being read by [place] M Nathian Stencil, PA-C 07/26/2017, 10:36 AM

## 2017-07-26 NOTE — Care Management Note (Signed)
Case Management Note  Patient Details  Name: Roger [name] MRN: [id number] Date of Birth: Oct 20, 1940  Subjective/Objective: Pt presented for Chest Pain-s/p stress test. Hx HD TTS schedule. PTA from home alone. Pt uses [facility] for transportation needs and gets medications without any problems. No Home 02 in the home-pt on room air at the time of visit.                    Action/Plan: No further needs from CM at this time.   Expected Discharge Date:                  Expected Discharge Plan:  Home/Self Care  In-House Referral:  NA  Discharge planning Services  CM Consult  Post Acute Care Choice:  NA Choice offered to:  NA  DME Arranged:  N/A DME Agency:  NA  HH Arranged:  NA HH Agency:  NA  Status of Service:  Completed, signed off  If discussed at [place] of Stay Meetings, dates discussed:    Additional Comments:  Bethena Roys, RN 07/26/2017, 2:14 PM

## 2017-07-26 NOTE — Progress Notes (Signed)
Subjective:  Some belching for nuclear study this am  Objective:  Vitals:   07/25/17 2005 07/26/17 0019 07/26/17 0616 07/26/17 0618  BP: (!) 109/51 (!) 90/48  (!) 126/59  Pulse: 68 67  64  Resp: _0 Temp: 98.1 F (36.7 C)   97.9 F (36.6 C)  TempSrc: Oral   Oral  SpO2: 100%   100%  Weight:   174 lb 6.4 oz (79.1 kg)   Height:        Intake/Output from previous day:  Intake/Output Summary (Last 24 hours) at 07/26/2017 0820 Last data filed at 07/25/2017 1800 Gross per 24 hour  Intake 340 ml  Output -  Net 340 ml    Physical Exam: Affect appropriate Healthy:  appears stated age HEENT: normal Neck supple with no adenopathy JVP normal no bruits no thyromegaly Lungs clear with no wheezing and good diaphragmatic motion Heart:  S1/S2 SEM murmur, no rub, gallop or click PMI normal Abdomen: benighn, BS positve, no tenderness, no AAA no bruit.  No HSM or HJR Distal pulses intact with no bruits No edema Neuro non-focal Skin warm and dry No muscular weakness   Lab Results: Basic Metabolic Panel: Recent Labs    07/24/17 1827 07/25/17 0319  NA 136  --   K 3.2*  --   CL 91*  --   CO2 30  --   GLUCOSE 104*  --   BUN 9  --   CREATININE 4.20* 5.56*  CALCIUM 8.9  --    Liver Function Tests: Recent Labs    07/25/17 0150  AST 27  ALT 17  ALKPHOS 76  BILITOT 1.0  PROT 8.1  ALBUMIN 3.9   Recent Labs    07/25/17 0150  LIPASE 49   CBC: Recent Labs    07/25/17 1125 07/26/17 0630  WBC 9.5 8.1  HGB 11.0* 10.6*  HCT 33.9* 33.3*  MCV 96.6 96.0  PLT 237 234   Cardiac Enzymes: Recent Labs    07/25/17 0150 07/25/17 0742 07/25/17 1356  TROPONINI <0.03 <0.03 <0.03   Imaging: Dg Chest 2 View  Result Date: 07/24/2017 CLINICAL DATA:  Central chest pain. EXAM: CHEST - 2 VIEW COMPARISON:  10/22/2016 FINDINGS: AP and lateral views of the chest show no focal airspace consolidation. No pulmonary edema or pleural effusion. The cardiopericardial  silhouette is within normal limits for size. Right IJ dialysis catheter tip overlies the SVC/RA junction. The visualized bony structures of the thorax are intact. IMPRESSION: No active cardiopulmonary disease. Electronically Signed   By: Misty Stanley M.D.   On: 07/24/2017 19:07   Dg Abd 1 View  Result Date: 07/25/2017 CLINICAL DATA:  77 year old male with nausea vomiting. EXAM: ABDOMEN - 1 VIEW COMPARISON:  Abdominal CT dated 11/18/2016 FINDINGS: Evaluation is limited due to patient's body habitus. There is no bowel dilatation or evidence of obstruction. There is degenerative changes of the spine. No acute osseous pathology. Multiple metallic prostate clips. IMPRESSION: No bowel dilatation. Electronically Signed   By: Anner Crete M.D.   On: 07/25/2017 02:44    Cardiac Studies:  ECG: NSR IVCD first degree block no acute ST chagnes    Telemetry:  NSR 07/26/2017   Echo: EF 60-65% MAC AV sclerosis no RWMA;s   Medications:   . allopurinol  100 mg Oral Daily  . amLODipine  10 mg Oral Daily  . atorvastatin  80 mg Oral q1800  . calcitRIOL  0.25 mcg Oral Q T,Th,Sa-HD  .  carvedilol  6.25 mg Oral BID WC  . clopidogrel  75 mg Oral Daily  . ferric citrate  210 mg Oral BID WC  . heparin  5,000 Units Subcutaneous Q8H  . isosorbide mononitrate  30 mg Oral Daily  . pantoprazole  40 mg Oral BID AC      Assessment/Plan:   Chest Pain:  R/O no acute ECG changes for nuclear study this am continue beta blocker plavix and nitrates  Known severe 3 VD no targets for CABG DES to mid LAD 12/04/16 will consider cath for high ischemic burden   Jenkins Rouge 07/26/2017, 8:20 AM

## 2017-07-26 NOTE — Consult Note (Addendum)
[facility] Renal Consultation Note  Indication for Consultation:  Management of ESRD/hemodialysis; anemia, hypertension/volume and secondary hyperparathyroidism  HPI: Roger [name] is a 77 y.o. male with ESRD  started HD during [facility] admit 10/2016 NSTEMI /CAD,- Hx GIB,now  admitted with Chest pain . He is complaint with HD (TTS [facility] Unit). Reports chest discomfort =" Similar to same episode in 2018 when he had an MI and started HD ." Also reports belching more frequently was improved somewhat with Zantac  But last HD Thursday after HD  CP at home and came to ER . He denies SOB, fevers, chills, cough, N/V. He had recent  07/22/17 R FA AVF ligated secondary to Steal syndrome . And has had HD with [place] cath  without  reported problems .   On Admit negative Cardiac enzymes , CXR  Unremarkable ,and Cardiology evaluating with Stress Test just done. Noted per Cardiology no ischemia  And EF preserved at 56% with no further "Ischemic eval at this time." We are consulted for HD / ESRD issues  with plans for HD today via P.cath .          Past Medical History:  Diagnosis Date  . Anemia   . Anginal pain ([place])   . Arthritis    "all over; bad in the legs" (12/04/2016)  . CAD (coronary artery disease)    a. NSTEMI in setting of anemia; b. 10/2016 MV: inflat ST dep with large, reversible ant, apical, inflat defect; c. 10/2016 Cath: LM nl, LAD 40p, 28m D1 70, D2 80, RI min irregs, LCX 100ost, RCA 100p, 1024m, RPDA fills via collats from LAD, RPAV small-->Initial conservative Rx in setting of GIB w/ plan for PCI LAD if H/H stable on ASA/Plavix;  d. 12/04/16 s/p PTCA and DES to mLAD.  . Marland Kitchenepression    situational   . Diastolic dysfunction    a. 10/2016 Echo: EF 55-60%, no rwma, Gr2 DD, triv MR, mildly dil LA.  . Marland KitchenSRD (end stage renal disease) ([facility]   a. 10/2016 HD catheter insterted and HD initiated; b. Pending permanent HD access folllowing Cardiac Revascularization.  . [facility]    [place]; [address]; [place] usually; [place] [facility] [place] staying w/daughter" (12/04/2016)  . GERD (gastroesophageal reflux disease)   . GIB (gastrointestinal bleeding) 10/2011   a. 10/2016 3u PRBCs - EGD/Colonoscopy: angiodysplasia w/ diverticulosis. No apparent bleeding. Polypecotmy->tubular adenomas.  . Gout   . Headache    no migraines for years  . Heart murmur   . High cholesterol   . History of blood transfusion 10/18/2016   "related to anemia" (12/04/2016)  . History of kidney stones    years ago  . Hypertension   . Iron deficiency anemia   . Myocardial infarction ([facility]08/05/2016  . Old MI (myocardial infarction)    "found evidence of this on 10/18/2016"  . PAF (paroxysmal atrial fibrillation) ([facility]  . Peripheral vascular disease ([place]   BLE  . Prostate cancer ([facility]   a. s/p seed implantation.  . Tobacco abuse     Past Surgical History:  Procedure Laterality Date  . AV FISTULA PLACEMENT Right 03/28/2017   Procedure: ARTERIOVENOUS (AV) BRACHIOCEPHALIC FISTULA CREATION;  Surgeon: DiAngelia MouldMD;  [facility] Service: Vascular;  Laterality: Right;  . CARDIAC CATHETERIZATION  10/2016  . COLONOSCOPY WITH PROPOFOL N/A 10/21/2016   Procedure: COLONOSCOPY WITH PROPOFOL;  Surgeon:  Gatha Mayer, MD;  [facility] ENDOSCOPY;  Service: Endoscopy;  Laterality: N/A;  . CORONARY ANGIOPLASTY WITH STENT PLACEMENT  12/04/2016   "1 stent"  . CORONARY STENT INTERVENTION N/A 12/04/2016   Procedure: CORONARY STENT INTERVENTION;  Surgeon: Wellington Hampshire, MD;  Location: [place] CV LAB;  Service: Cardiovascular;  Laterality: N/A;  . ESOPHAGOGASTRODUODENOSCOPY N/A 10/21/2016   Procedure: ESOPHAGOGASTRODUODENOSCOPY (EGD);  Surgeon: Gatha Mayer, MD;  Location: [facility] ENDOSCOPY;  Service: Endoscopy;  Laterality: N/A;  . INSERTION OF DIALYSIS CATHETER Right 10/22/2016   Procedure: INSERTION OF TUNNELED DIALYSIS CATHETER;  Surgeon: Elam Dutch,  MD;  Location: [place];  Service: Vascular;  Laterality: Right;  . LEFT HEART CATH AND CORONARY ANGIOGRAPHY N/A 10/23/2016   Procedure: LEFT HEART CATH AND CORONARY ANGIOGRAPHY;  Surgeon: Wellington Hampshire, MD;  Location: [place] CV LAB;  Service: Cardiovascular;  Laterality: N/A;  . LIGATION OF ARTERIOVENOUS  FISTULA Right 05/12/2017   Procedure: BANDING OF RIGHT UPPER ARM ARTERIOVENOUS  FISTULA USING 6MM X 10CM GORE TEX VASCULAR GRAFT;  Surgeon: Angelia Mould, MD;  Location: [place];  Service: Vascular;  Laterality: Right;  . LIGATION OF ARTERIOVENOUS  FISTULA Right 07/03/2017   Procedure: LIGATION OF ARTERIOVENOUS  FISTULA RIGHT ARM;  Surgeon: Angelia Mould, MD;  Location: [place];  Service: Vascular;  Laterality: Right;  . TONSILLECTOMY        Family History  Problem Relation Age of Onset  . Cancer Mother   . Cancer Father   . Heart attack Father   . Diabetes Father   . Hypertension Father   . Cancer Sister   . Cancer Brother   . Diabetes Brother   . Hypertension Brother   . Hypertension Daughter       reports that he quit smoking about 9 months ago. His smoking use included cigarettes. He has a 25.00 pack-year smoking history. He has never used smokeless tobacco. He reports that he does not drink alcohol or use drugs.  No Known Allergies  Prior to Admission medications   Medication Sig Start Date End Date Taking? Authorizing Provider  allopurinol (ZYLOPRIM) 100 MG tablet Take 1 tablet (100 mg total) by mouth daily. 10/26/16  Yes Florencia Reasons, MD  amLODipine (NORVASC) 10 MG tablet Take 1 tablet (10 mg total) by mouth daily. 11/26/16  Yes Theora Gianotti, NP  atorvastatin (LIPITOR) 80 MG tablet Take 1 tablet (80 mg total) by mouth daily at 6 PM. 11/26/16  Yes Theora Gianotti, NP  b complex vitamins tablet Take 1 tablet by mouth See admin instructions. Given at dialysis   Yes [provider]  calcitRIOL (ROCALTROL) 0.25 MCG capsule Take 1 capsule  (0.25 mcg total) by mouth Every Tuesday,Thursday,and Saturday with dialysis. 12/05/16  Yes Bhagat, Bhavinkumar, PA  carvedilol (COREG) 6.25 MG tablet Take 1 tablet (6.25 mg total) by mouth 2 (two) times daily with a meal. 04/25/17  Yes Wellington Hampshire, MD  clopidogrel (PLAVIX) 75 MG tablet Take 1 tablet (75 mg total) by mouth daily. 11/26/16  Yes Theora Gianotti, NP  ferric citrate (AURYXIA) 1 GM 210 MG(Fe) tablet Take 210 mg by mouth 2 (two) times daily.   Yes [provider]  isosorbide mononitrate (IMDUR) 30 MG 24 hr tablet Take 30 mg by mouth daily. 01/24/17  Yes [provider]  ranitidine (ZANTAC) 150 MG tablet Take 150 mg by mouth daily.    Yes [provider]  oxyCODONE (ROXICODONE) 5 MG  immediate release tablet Take 1 tablet (5 mg total) by mouth every 4 (four) hours as needed for severe pain. 07/03/17   Angelia Mould, MD     Anti-infectives (From admission, onward)   None      Results for orders placed or performed during the hospital encounter of 07/24/17 (from the past 48 hour(s))  Basic metabolic panel     Status: Abnormal   Collection Time: 07/24/17  6:27 PM  Result Value Ref Range   Sodium 136 135 - 145 mmol/L   Potassium 3.2 (L) 3.5 - 5.1 mmol/L   Chloride 91 (L) 101 - 111 mmol/L   CO2 30 22 - 32 mmol/L   Glucose, Bld 104 (H) 65 - 99 mg/dL   BUN 9 6 - 20 mg/dL   Creatinine, Ser 4.20 (H) 0.61 - 1.24 mg/dL   Calcium 8.9 8.9 - 10.3 mg/dL   GFR calc non Af Amer 13 (L) >60 mL/min   GFR calc Af Amer 15 (L) >60 mL/min    Comment: (NOTE) The eGFR has been calculated using the CKD EPI equation. This calculation has not been validated in all clinical situations. eGFR's persistently <60 mL/min signify possible Chronic Kidney Disease.    Anion gap 15 5 - 15    Comment: Performed at [facility] [address], [place], [place] [address]  CBC     Status: Abnormal   Collection Time: 07/24/17  6:27 PM  Result Value Ref Range    WBC 10.3 4.0 - 10.5 K/uL   RBC 3.58 (L) 4.22 - 5.81 MIL/uL   Hemoglobin 11.1 (L) 13.0 - 17.0 g/dL   HCT 34.1 (L) 39.0 - 52.0 %   MCV 95.3 78.0 - 100.0 fL   MCH 31.0 26.0 - 34.0 pg   MCHC 32.6 30.0 - 36.0 g/dL   RDW 18.5 (H) 11.5 - 15.5 %   Platelets 242 150 - 400 K/uL    Comment: Performed at [facility], [place] [address] [place], [place] [address]  I-stat troponin, ED     Status: None   Collection Time: 07/24/17  6:31 PM  Result Value Ref Range   Troponin i, poc 0.02 0.00 - 0.08 ng/mL   Comment 3            Comment: Due to the release kinetics of cTnI, a negative result within the first hours of the onset of symptoms does not rule out myocardial infarction with certainty. If myocardial infarction is still suspected, repeat the test at appropriate intervals.   Type and screen [place]     Status: None   Collection Time: 07/24/17 11:46 PM  Result Value Ref Range   ABO/RH(D) O POS    Antibody Screen NEG    Sample Expiration      07/27/2017 Performed at [facility], [place] [address] [place], [place] [address]   I-Stat Troponin, ED (not at [facility])     Status: None   Collection Time: 07/24/17 11:58 PM  Result Value Ref Range   Troponin i, poc 0.00 0.00 - 0.08 ng/mL   Comment 3            Comment: Due to the release kinetics of cTnI, a negative result within the first hours of the onset of symptoms does not rule out myocardial infarction with certainty. If myocardial infarction is still suspected, repeat the test at appropriate intervals.   POC occult blood, ED Provider will collect  Status: None   Collection Time: 07/25/17 12:00 AM  Result Value Ref Range   Fecal Occult Bld NEGATIVE NEGATIVE  Hepatic function panel     Status: None   Collection Time: 07/25/17  1:50 AM  Result Value Ref Range   Total Protein 8.1 6.5 - 8.1 g/dL   Albumin 3.9 3.5 - 5.0 g/dL   AST 27 15 - 41 U/L   ALT 17 17 - 63 U/L   Alkaline Phosphatase 76 38 - 126 U/L    Total Bilirubin 1.0 0.3 - 1.2 mg/dL   Bilirubin, Direct 0.2 0.1 - 0.5 mg/dL   Indirect Bilirubin 0.8 0.3 - 0.9 mg/dL    Comment: Performed at [facility], [place] [address], [place], [place] [address]  Lipase, blood     Status: None   Collection Time: 07/25/17  1:50 AM  Result Value Ref Range   Lipase 49 11 - 51 U/L    Comment: Performed at [facility] [address] [place], [place] [address]  CBC     Status: Abnormal   Collection Time: 07/25/17  1:50 AM  Result Value Ref Range   WBC 8.8 4.0 - 10.5 K/uL   RBC 3.30 (L) 4.22 - 5.81 MIL/uL   Hemoglobin 10.1 (L) 13.0 - 17.0 g/dL   HCT 31.3 (L) 39.0 - 52.0 %   MCV 94.8 78.0 - 100.0 fL   MCH 30.6 26.0 - 34.0 pg   MCHC 32.3 30.0 - 36.0 g/dL   RDW 18.6 (H) 11.5 - 15.5 %   Platelets 220 150 - 400 K/uL    Comment: Performed at [facility], [place] [address] [place], [place] [address]  Troponin I (q 6hr x 3)     Status: None   Collection Time: 07/25/17  1:50 AM  Result Value Ref Range   Troponin I <0.03 <0.03 ng/mL    Comment: Performed at [facility] [address], [place], [place] [address]  CBC     Status: Abnormal   Collection Time: 07/25/17  3:19 AM  Result Value Ref Range   WBC 8.3 4.0 - 10.5 K/uL   RBC 3.38 (L) 4.22 - 5.81 MIL/uL   Hemoglobin 10.5 (L) 13.0 - 17.0 g/dL   HCT 32.2 (L) 39.0 - 52.0 %   MCV 95.3 78.0 - 100.0 fL   MCH 31.1 26.0 - 34.0 pg   MCHC 32.6 30.0 - 36.0 g/dL   RDW 18.9 (H) 11.5 - 15.5 %   Platelets 209 150 - 400 K/uL    Comment: Performed at [facility], [place] [address], [place],  [address]  Creatinine, serum     Status: Abnormal   Collection Time: 07/25/17  3:19 AM  Result Value Ref Range   Creatinine, Ser 5.56 (H) 0.61 - 1.24 mg/dL   GFR calc non Af Amer 9 (L) >60 mL/min   GFR calc Af Amer 10 (L) >60 mL/min    Comment: (NOTE) The eGFR has been calculated using the CKD EPI equation. This calculation has not been validated in all clinical situations. eGFR's  persistently <60 mL/min signify possible Chronic Kidney Disease. Performed at [facility], [place] [address], [place], [place] [address]   Troponin I (q 6hr x 3)     Status: None   Collection Time: 07/25/17  7:42 AM  Result Value Ref Range   Troponin I <0.03 <0.03 ng/mL    Comment: Performed at [facility], [address], [place], [place] [address]  CBC     Status: Abnormal   Collection Time: 07/25/17 11:25 AM  Result Value Ref Range   WBC 9.5 4.0 - 10.5 K/uL   RBC 3.51 (L) 4.22 - 5.81 MIL/uL   Hemoglobin 11.0 (L) 13.0 - 17.0 g/dL   HCT 33.9 (L) 39.0 - 52.0 %   MCV 96.6 78.0 - 100.0 fL   MCH 31.3 26.0 - 34.0 pg   MCHC 32.4 30.0 - 36.0 g/dL   RDW 19.2 (H) 11.5 - 15.5 %   Platelets 237 150 - 400 K/uL    Comment: Performed at [facility], [place] [address] [place], [place] [address]  MRSA PCR Screening     Status: None   Collection Time: 07/25/17  1:50 PM  Result Value Ref Range   MRSA by PCR NEGATIVE NEGATIVE    Comment:        The GeneXpert MRSA Assay (FDA approved for NASAL specimens only), is one component of a comprehensive MRSA colonization surveillance program. It is not intended to diagnose MRSA infection nor to guide or monitor treatment for MRSA infections. Performed at [facility], [place] [address], [place], [place] [address]   Troponin I (q 6hr x 3)     Status: None   Collection Time: 07/25/17  1:56 PM  Result Value Ref Range   Troponin I <0.03 <0.03 ng/mL    Comment: Performed at [facility] [address] [place], [place] [address]  CBC     Status: Abnormal   Collection Time: 07/26/17  6:30 AM  Result Value Ref Range   WBC 8.1 4.0 - 10.5 K/uL   RBC 3.47 (L) 4.22 - 5.81 MIL/uL   Hemoglobin 10.6 (L) 13.0 - 17.0 g/dL   HCT 33.3 (L) 39.0 - 52.0 %   MCV 96.0 78.0 - 100.0 fL   MCH 30.5 26.0 - 34.0 pg   MCHC 31.8 30.0 - 36.0 g/dL   RDW 19.4 (H) 11.5 - 15.5 %   Platelets 234 150 - 400 K/uL    Comment: Performed at [facility], [place] [address] [place], [place] [address]  Comprehensive metabolic panel     Status: Abnormal   Collection Time: 07/26/17  6:30 AM  Result Value Ref Range   Sodium 135 135 - 145 mmol/L   Potassium 4.1 3.5 - 5.1 mmol/L   Chloride 89 (L) 101 - 111 mmol/L   CO2 29 22 - 32 mmol/L   Glucose, Bld 93 65 - 99 mg/dL   BUN 40 (H) 6 - 20 mg/dL   Creatinine, Ser 9.07 (H) 0.61 - 1.24 mg/dL    Comment: DELTA CHECK NOTED   Calcium 9.1 8.9 - 10.3 mg/dL   Total Protein 8.7 (H) 6.5 - 8.1 g/dL   Albumin 3.9 3.5 - 5.0 g/dL   AST 25 15 - 41 U/L   ALT 16 (L) 17 - 63 U/L   Alkaline Phosphatase 78 38 - 126 U/L   Total Bilirubin 0.7 0.3 - 1.2 mg/dL   GFR calc non Af Amer 5 (L) >60 mL/min   GFR calc Af Amer 6 (L) >60 mL/min    Comment: (NOTE) The eGFR has been calculated using the CKD EPI equation. This calculation has not been validated in all clinical situations. eGFR's persistently <60 mL/min signify possible Chronic Kidney Disease.    Anion gap 17 (H) 5 - 15    Comment: Performed  at [facility], [place] [address], [place], [place] [address]     ROS: see hpi   Physical Exam: Vitals:   07/26/17 0019 07/26/17 0618  BP: (!) 90/48 (!) 126/59  Pulse: 67 64  Resp: 18 18  Temp:  97.9 F (36.6 C)  SpO2:  100%     General: alert AAm NAD  WD, WN ,OX4 HEENT: [place] , EOMI, Poor dental hygiene, multiple teeth missing  Neck: no jvd or carotid bruits  Heart: RRR soft 1/6 sem  No rub or gallop Lungs: CTA nonlabored breathing Abdomen: Obese, BS pos  Soft , NT, ND Extremities: no pedal edema,  Skin: no overt rash , warm dry Neuro: OX4, no overt neuro deficits  appreciated moves all extremities  Dialysis Access: R IJ Perm cath [facility] 07/22/17 RArm AVF Ligated sec steal)  Dialysis Orders: Center: [place]  on TTS . EDW 81.0 HD Bath 2k, 2ca  Time 4hrs Heparin 4000. Access R IJ P cath ( sp 07/22/17 R Arm AVF Ligation )     Calcitriol 1.5 mcg po /HD   Mircera 75 mcg iv q 2wks hd last on 5/02     Assessment/Plan 1. Chest Pain - Card. Consulted by admit team , neg CE, Neg Stress test for new ischemia / ?gi origin , denies CP since admit  2. ESRD -  HD TTS plan hd today  3. Hypertension/volume  - bp stable and volume stable uf 1.5 liter on hd / on amlodipine and coreg / fu bp trend  4. Anemia  - HGB 10.6 ESA on 5/02  Monitor HGB  5. Metabolic bone disease -  Po Vit d on hd , Auryxia binder  6. HO GERD-Zantac po   Ernest Haber, PA-C [place] [phone] 07/26/2017, 8:34 AM    I have seen and examined this patient and agree with plan and assessment in the above note with renal recommendations/intervention highlighted.  Pt currently chest pain free.  Cardiology workup pending.  Plan for HD today to keep on his outpatient schedule.  Broadus John A Noriko Macari,MD 07/26/2017 3:02 PM

## 2017-07-26 NOTE — Progress Notes (Signed)
PROGRESS NOTE    Roger [name]  PPI:[id number] DOB: 1940-09-13 DOA: 07/24/2017 PCP: Lujean Amel, MD  Brief Narrative:76 y.o.malewitha hx of CAD status post CABG and stenting w/ last stenting in September 2018, GI bleed in August 2018, and ESRD on hemodialysis on Tuesday Thursdays and Saturday who developed chest pain with belching after dialysis 07/24/17. He denied associated shortness of breath productive cough fever or chills. Two days prior the patient reported some self-limited dark vomitus.   In the ER his EKG was nonspecific, troponin was negative, and chest x-ray was unremarkable.   Significant Events: 5/10 admit  5/10 TTE - EF 60-65% - no WMA - mild LVH - grade 1 DD - AoV mass c/w vegetation - MV mass c/w vegetation      Assessment & Plan:   Principal Problem:   Chest pain Active Problems:   Peripheral vascular disease, unspecified (HCC)   Lower GI bleed   Angiodysplasia of cecum   CAD (coronary artery disease)   ESRD (end stage renal disease) (HCC)   Aortic valve sclerosis   Essential hypertension   Pure hypercholesterolemia  1atypical chest pain-low risk stress test.  No further work-up planned by cardiology. 2]esrd dialysis today. Hypertension on amlodipine and Coreg  Anemia likely from ESRD previous history of GI bleed  Gout on allopurinol      DVT prophylaxisheparin Code Status: full Family Communication:none Disposition Plan: Hope to DC home tomorrow if okay with cardiology consultants: [facility] MG, nephrology  Procedures:none Antimicrobials: none  Subjective:feels well hungry   Objective: Vitals:   07/26/17 1027 07/26/17 1028 07/26/17 1032 07/26/17 1235  BP: (!) 85/42 (!) 85/45 (!) 114/53 (!) 120/58  Pulse:    62  Resp:      Temp:    (!) 97.5 F (36.4 C)  TempSrc:    Oral  SpO2:    100%  Weight:      Height:        Intake/Output Summary (Last 24 hours) at 07/26/2017 1353 Last data filed at 07/26/2017 0932 Gross per 24 hour    Intake 340 ml  Output -  Net 340 ml   Filed Weights   07/24/17 1814 07/26/17 0616  Weight: 80.3 kg (177 lb) 79.1 kg (174 lb 6.4 oz)    Examination:  General exam: Appears calm and comfortable  Respiratory system: Clear to auscultation. Respiratory effort normal. Cardiovascular system: S1 & S2 heard, RRR. No JVD, murmurs, rubs, gallops or clicks. No pedal edema. Gastrointestinal system: Abdomen is nondistended, soft and nontender. No organomegaly or masses felt. Normal bowel sounds heard. Central nervous system: Alert and oriented. No focal neurological deficits. Extremities: Symmetric 5 x 5 power. Skin: No rashes, lesions or ulcers Psychiatry: Judgement and insight appear normal. Mood & affect appropriate.     Data Reviewed: I have personally reviewed following labs and imaging studies  CBC: Recent Labs  Lab 07/24/17 1827 07/25/17 0150 07/25/17 0319 07/25/17 1125 07/26/17 0630  WBC 10.3 8.8 8.3 9.5 8.1  HGB 11.1* 10.1* 10.5* 11.0* 10.6*  HCT 34.1* 31.3* 32.2* 33.9* 33.3*  MCV 95.3 94.8 95.3 96.6 96.0  PLT 242 220 209 237 063   Basic Metabolic Panel: Recent Labs  Lab 07/24/17 1827 07/25/17 0319 07/26/17 0630  NA 136  --  135  K 3.2*  --  4.1  CL 91*  --  89*  CO2 30  --  29  GLUCOSE 104*  --  93  BUN 9  --  40*  CREATININE 4.20*  5.56* 9.07*  CALCIUM 8.9  --  9.1   GFR: Estimated Creatinine Clearance: 7.1 mL/min (A) (by C-G formula based on SCr of 9.07 mg/dL (H)). Liver Function Tests: Recent Labs  Lab 07/25/17 0150 07/26/17 0630  AST 27 25  ALT 17 16*  ALKPHOS 76 78  BILITOT 1.0 0.7  PROT 8.1 8.7*  ALBUMIN 3.9 3.9   Recent Labs  Lab 07/25/17 0150  LIPASE 49   No results for input(s): AMMONIA in the last 168 hours. Coagulation Profile: No results for input(s): INR, PROTIME in the last 168 hours. Cardiac Enzymes: Recent Labs  Lab 07/25/17 0150 07/25/17 0742 07/25/17 1356  TROPONINI <0.03 <0.03 <0.03   BNP (last 3 results) No results  for input(s): PROBNP in the last 8760 hours. HbA1C: No results for input(s): HGBA1C in the last 72 hours. CBG: No results for input(s): GLUCAP in the last 168 hours. Lipid Profile: No results for input(s): CHOL, HDL, LDLCALC, TRIG, CHOLHDL, LDLDIRECT in the last 72 hours. Thyroid Function Tests: No results for input(s): TSH, T4TOTAL, FREET4, T3FREE, THYROIDAB in the last 72 hours. Anemia Panel: No results for input(s): VITAMINB12, FOLATE, FERRITIN, TIBC, IRON, RETICCTPCT in the last 72 hours. Sepsis Labs: No results for input(s): PROCALCITON, LATICACIDVEN in the last 168 hours.  Recent Results (from the past 240 hour(s))  MRSA PCR Screening     Status: None   Collection Time: 07/25/17  1:50 PM  Result Value Ref Range Status   MRSA by PCR NEGATIVE NEGATIVE Final    Comment:        The GeneXpert MRSA Assay (FDA approved for NASAL specimens only), is one component of a comprehensive MRSA colonization surveillance program. It is not intended to diagnose MRSA infection nor to guide or monitor treatment for MRSA infections. Performed at [facility], [place] [address] [place], [place] [address]          Radiology Studies: Dg Chest 2 View  Result Date: 07/24/2017 CLINICAL DATA:  Central chest pain. EXAM: CHEST - 2 VIEW COMPARISON:  10/22/2016 FINDINGS: AP and lateral views of the chest show no focal airspace consolidation. No pulmonary edema or pleural effusion. The cardiopericardial silhouette is within normal limits for size. Right IJ dialysis catheter tip overlies the SVC/RA junction. The visualized bony structures of the thorax are intact. IMPRESSION: No active cardiopulmonary disease. Electronically Signed   By: Misty Stanley M.D.   On: 07/24/2017 19:07   Dg Abd 1 View  Result Date: 07/25/2017 CLINICAL DATA:  77 year old male with nausea vomiting. EXAM: ABDOMEN - 1 VIEW COMPARISON:  Abdominal CT dated 11/18/2016 FINDINGS: Evaluation is limited due to patient's body  habitus. There is no bowel dilatation or evidence of obstruction. There is degenerative changes of the spine. No acute osseous pathology. Multiple metallic prostate clips. IMPRESSION: No bowel dilatation. Electronically Signed   By: Anner Crete M.D.   On: 07/25/2017 02:44   Nm Myocar Multi W/spect W/wall Motion / Ef  Result Date: 07/26/2017  There was no ST segment deviation noted during stress.  No T wave inversion was noted during stress.  Findings consistent with prior myocardial infarction.  This is a low risk study.  The left ventricular ejection fraction is normal (55-65%).  Small inferior wall infarct at mid and basal level with no ischemia EF preserved at 56%        Scheduled Meds: . allopurinol  100 mg Oral Daily  . amLODipine  10 mg Oral Daily  . atorvastatin  80 mg Oral q1800  . calcitRIOL  1.5 mcg Oral Daily  . carvedilol  6.25 mg Oral BID WC  . clopidogrel  75 mg Oral Daily  . ferric citrate  210 mg Oral BID WC  . heparin  5,000 Units Subcutaneous Q8H  . isosorbide mononitrate  30 mg Oral Daily  . pantoprazole  40 mg Oral BID AC  . regadenoson       Continuous Infusions:   LOS: 0 days    Georgette Shell, MD [facility]   If 7PM-7AM, please contact night-coverage [email or web address] Password [email or web address] 07/26/2017, 1:53 PM

## 2017-07-26 NOTE — Plan of Care (Signed)
  Problem: Clinical Measurements: Goal: Ability to maintain clinical measurements within normal limits will improve Outcome: Progressing   

## 2017-07-26 NOTE — Plan of Care (Signed)
NM stress test this am. HD this afternoon.

## 2017-07-27 DIAGNOSIS — I208 Other forms of angina pectoris: Secondary | ICD-10-CM | POA: Diagnosis not present

## 2017-07-27 DIAGNOSIS — R079 Chest pain, unspecified: Secondary | ICD-10-CM | POA: Diagnosis not present

## 2017-07-27 NOTE — Progress Notes (Signed)
Patient received discharge information and acknowledged understanding of it. Patient IV was removed.  

## 2017-07-27 NOTE — Discharge Summary (Signed)
Physician Discharge Summary  Roger [name] WER:[id number] DOB: 1940/05/02 DOA: 07/24/2017  PCP: Lujean Amel, MD  Admit date: 07/24/2017 Discharge date: 07/27/2017  Admitted From:HOME  Recommendations for Outpatient Follow-up:  1. Follow up with PCP in 1-2 weeks 2. Please obtain BMP/CBC in one week  [place] Equipment/Devices none  discharge Condition: Stable CODE STATUS full code Diet recommendation: Cardiac renal diet Brief/Interim Summary::77 y.o.malewitha hxof CAD status post CABG and stentingw/last stenting in September 2018, GI bleed in August 2018,andESRD on hemodialysis on Tuesday Thursdays and Saturday who developedchest pain with belching after dialysis 07/24/17.He deniedassociated shortness of breath productive cough fever or chills.Twodays prior thepatientreportedsomeself-limiteddark vomitus.   In the [place] wasnonspecific,troponin was negative, andchest x-ray wasunremarkable.   Significant Events: 5/10 admit  5/10 TTE - EF 60-65% - no WMA - mild LVH - grade 1 DD - AoV mass c/w vegetation - MV mass c/w vegetation    Discharge Diagnoses:  Principal Problem:   Chest pain Active Problems:   Peripheral vascular disease, unspecified ([place])   Lower GI bleed   Angiodysplasia of cecum   CAD (coronary artery disease)   ESRD (end stage renal disease) ([place])   Aortic valve sclerosis   Essential hypertension   Pure hypercholesterolemia  1] atypical chest pain patient with low risk stress test.  No chest pain during hospitalization.  Patient will be discharged home today.  2] ESRD hemodialysis Tuesday Thursday and Saturdays.  Patient received dialysis yesterday.  3] hypertension continue amlodipine and Coreg.  4] history of gout continue allopurinol.  Discharge Instructions  Discharge Instructions    Call MD for:  difficulty breathing, headache or visual disturbances   Complete by:  As directed    Call MD for:  persistant dizziness  or light-headedness   Complete by:  As directed    Call MD for:  persistant nausea and vomiting   Complete by:  As directed    Call MD for:  severe uncontrolled pain   Complete by:  As directed    Call MD for:  temperature >100.4   Complete by:  As directed    Diet - low sodium heart healthy   Complete by:  As directed    Increase activity slowly   Complete by:  As directed      Allergies as of 07/27/2017   No Known Allergies     Medication List    TAKE these medications   allopurinol 100 MG tablet Commonly known as:  ZYLOPRIM Take 1 tablet (100 mg total) by mouth daily.   amLODipine 10 MG tablet Commonly known as:  NORVASC Take 1 tablet (10 mg total) by mouth daily.   atorvastatin 80 MG tablet Commonly known as:  LIPITOR Take 1 tablet (80 mg total) by mouth daily at 6 PM.   AURYXIA 1 GM 210 MG(Fe) tablet Generic drug:  ferric citrate Take 210 mg by mouth 2 (two) times daily.   b complex vitamins tablet Take 1 tablet by mouth See admin instructions. Given at dialysis   calcitRIOL 0.25 MCG capsule Commonly known as:  ROCALTROL Take 1 capsule (0.25 mcg total) by mouth Every Tuesday,Thursday,and Saturday with dialysis.   carvedilol 6.25 MG tablet Commonly known as:  COREG Take 1 tablet (6.25 mg total) by mouth 2 (two) times daily with a meal.   clopidogrel 75 MG tablet Commonly known as:  PLAVIX Take 1 tablet (75 mg total) by mouth daily.   isosorbide mononitrate 30 MG 24 hr tablet Commonly known as:  IMDUR Take 30 mg by mouth daily.   oxyCODONE 5 MG immediate release tablet Commonly known as:  ROXICODONE Take 1 tablet (5 mg total) by mouth every 4 (four) hours as needed for severe pain.   ranitidine 150 MG tablet Commonly known as:  ZANTAC Take 150 mg by mouth daily.      Follow-up Information    Koirala, Dibas, MD Follow up.   Specialty:  Family Medicine Contact information: [place] [address] [phone]         Wellington Hampshire, MD .   Specialty:  Cardiology Contact information: [place] [address] [phone]          No Known Allergies  Consultations:  [facility]   Procedures/Studies: Dg Chest 2 View  Result Date: 07/24/2017 CLINICAL DATA:  Central chest pain. EXAM: CHEST - 2 VIEW COMPARISON:  10/22/2016 FINDINGS: AP and lateral views of the chest show no focal airspace consolidation. No pulmonary edema or pleural effusion. The cardiopericardial silhouette is within normal limits for size. Right IJ dialysis catheter tip overlies the SVC/RA junction. The visualized bony structures of the thorax are intact. IMPRESSION: No active cardiopulmonary disease. Electronically Signed   By: Misty Stanley M.D.   On: 07/24/2017 19:07   Dg Abd 1 View  Result Date: 07/25/2017 CLINICAL DATA:  77 year old male with nausea vomiting. EXAM: ABDOMEN - 1 VIEW COMPARISON:  Abdominal CT dated 11/18/2016 FINDINGS: Evaluation is limited due to patient's body habitus. There is no bowel dilatation or evidence of obstruction. There is degenerative changes of the spine. No acute osseous pathology. Multiple metallic prostate clips. IMPRESSION: No bowel dilatation. Electronically Signed   By: Anner Crete M.D.   On: 07/25/2017 02:44   Nm Myocar Multi W/spect W/wall Motion / Ef  Result Date: 07/26/2017  There was no ST segment deviation noted during stress.  No T wave inversion was noted during stress.  Findings consistent with prior myocardial infarction.  This is a low risk study.  The left ventricular ejection fraction is normal (55-65%).  Small inferior wall infarct at mid and basal level with no ischemia EF preserved at 56%    (Echo, Carotid, EGD, Colonoscopy, ERCP)    Subjective:   Discharge Exam: Vitals:   07/27/17 0413 07/27/17 0755  BP: 110/71 109/65  Pulse: 72 65  Resp: 18   Temp:  98.3 F (36.8 C)  SpO2: 100% 98%   Vitals:   07/26/17 1810 07/26/17 2047  07/27/17 0413 07/27/17 0755  BP: (!) 118/54 121/62 110/71 109/65  Pulse: 64 77 72 65  Resp: 20  18   Temp: 98 F (36.7 C) 98.2 F (36.8 C)  98.3 F (36.8 C)  TempSrc: Oral Oral  Oral  SpO2: 98% 95% 100% 98%  Weight: 79 kg (174 lb 2.6 oz)  79.4 kg (175 lb 1.6 oz)   Height:        General: Pt is alert, awake, not in acute distress Cardiovascular: RRR, S1/S2 +, no rubs, no gallops Respiratory: CTA bilaterally, no wheezing, no rhonchi Abdominal: Soft, NT, ND, bowel sounds + Extremities: no edema, no cyanosis    The results of significant diagnostics from this hospitalization (including imaging, microbiology, ancillary and laboratory) are listed below for reference.     Microbiology: Recent Results (from the past 240 hour(s))  MRSA PCR Screening     Status: None   Collection Time: 07/25/17  1:50 PM  Result Value Ref Range  Status   MRSA by PCR NEGATIVE NEGATIVE Final    Comment:        The GeneXpert MRSA Assay (FDA approved for NASAL specimens only), is one component of a comprehensive MRSA colonization surveillance program. It is not intended to diagnose MRSA infection nor to guide or monitor treatment for MRSA infections. Performed at [facility], [place] [address], [place], [place] [address]      Labs: BNP (last 3 results) No results for input(s): BNP in the last 8760 hours. Basic Metabolic Panel: Recent Labs  Lab 07/24/17 1827 07/25/17 0319 07/26/17 0630  NA 136  --  135  K 3.2*  --  4.1  CL 91*  --  89*  CO2 30  --  29  GLUCOSE 104*  --  93  BUN 9  --  40*  CREATININE 4.20* 5.56* 9.07*  CALCIUM 8.9  --  9.1   Liver Function Tests: Recent Labs  Lab 07/25/17 0150 07/26/17 0630  AST 27 25  ALT 17 16*  ALKPHOS 76 78  BILITOT 1.0 0.7  PROT 8.1 8.7*  ALBUMIN 3.9 3.9   Recent Labs  Lab 07/25/17 0150  LIPASE 49   No results for input(s): AMMONIA in the last 168 hours. CBC: Recent Labs  Lab 07/24/17 1827 07/25/17 0150 07/25/17 0319  07/25/17 1125 07/26/17 0630  WBC 10.3 8.8 8.3 9.5 8.1  HGB 11.1* 10.1* 10.5* 11.0* 10.6*  HCT 34.1* 31.3* 32.2* 33.9* 33.3*  MCV 95.3 94.8 95.3 96.6 96.0  PLT 242 220 209 237 234   Cardiac Enzymes: Recent Labs  Lab 07/25/17 0150 07/25/17 0742 07/25/17 1356  TROPONINI <0.03 <0.03 <0.03   BNP: Invalid input(s): POCBNP CBG: No results for input(s): GLUCAP in the last 168 hours. D-Dimer No results for input(s): DDIMER in the last 72 hours. Hgb A1c No results for input(s): HGBA1C in the last 72 hours. Lipid Profile No results for input(s): CHOL, HDL, LDLCALC, TRIG, CHOLHDL, LDLDIRECT in the last 72 hours. Thyroid function studies No results for input(s): TSH, T4TOTAL, T3FREE, THYROIDAB in the last 72 hours.  Invalid input(s): FREET3 Anemia work up No results for input(s): VITAMINB12, FOLATE, FERRITIN, TIBC, IRON, RETICCTPCT in the last 72 hours. Urinalysis    Component Value Date/Time   COLORURINE YELLOW 10/20/2016 0255   APPEARANCEUR CLEAR 10/20/2016 0255   LABSPEC 1.010 10/20/2016 0255   LABSPEC 1.020 12/26/2009 1533   PHURINE 6.0 10/20/2016 0255   GLUCOSEU NEGATIVE 10/20/2016 0255   HGBUR NEGATIVE 10/20/2016 0255   BILIRUBINUR NEGATIVE 10/20/2016 0255   BILIRUBINUR Negative 12/26/2009 1533   KETONESUR NEGATIVE 10/20/2016 0255   PROTEINUR NEGATIVE 10/20/2016 0255   NITRITE NEGATIVE 10/20/2016 0255   LEUKOCYTESUR NEGATIVE 10/20/2016 0255   LEUKOCYTESUR Negative 12/26/2009 1533   Sepsis Labs Invalid input(s): PROCALCITONIN,  WBC,  LACTICIDVEN Microbiology Recent Results (from the past 240 hour(s))  MRSA PCR Screening     Status: None   Collection Time: 07/25/17  1:50 PM  Result Value Ref Range Status   MRSA by PCR NEGATIVE NEGATIVE Final    Comment:        The GeneXpert MRSA Assay (FDA approved for NASAL specimens only), is one component of a comprehensive MRSA colonization surveillance program. It is not intended to diagnose MRSA infection nor to guide  or monitor treatment for MRSA infections. Performed at [facility], [place] [address] [place], [place] [address]      Time coordinating discharge: 33 minutes  SIGNED:  Georgette Shell, MD  [facility] 07/27/2017, 9:08 AM  If 7PM-7AM, please contact night-coverage [email or web address] Password [email or web address]

## 2017-07-27 NOTE — Progress Notes (Addendum)
Subjective:  No cos ,tolerated hd yest / for dc today  Objective Vital signs in last 24 hours: Vitals:   07/26/17 2047 07/27/17 0413 07/27/17 0755 07/27/17 0950  BP: 121/62 110/71 109/65 118/62  Pulse: 77 72 65   Resp:  18    Temp: 98.2 F (36.8 C)  98.3 F (36.8 C)   TempSrc: Oral  Oral   SpO2: 95% 100% 98%   Weight:  79.4 kg (175 lb 1.6 oz)    Height:       Weight change: 0.893 kg (1 lb 15.5 oz)  Physical Exam: General: alert AAm NAD  WD, WN ,OX4  Heart: RRR soft 1/6 sem  No rub or gallop Lungs: CTA nonlabored breathing Abdomen: Obese, BS pos  Soft , NT, ND Extremities: no pedal edema,  Dialysis Access: R Hollace Kinnier cath [facility] 07/22/17 RArm AVF Ligated sec steal)  Dialysis Orders: Center: [place]  on TTS . EDW 81.0 HD Bath 2k, 2ca  Time 4hrs Heparin 4000. Access R IJ P cath ( sp 07/22/17 R Arm AVF Ligation )     Calcitriol 1.5 mcg po /HD   Mircera 75 mcg iv q 2wks hd last on 5/02  65   Problem/Plan: 1. Chest Pain - Card. Consulted by admit team , neg CE, Neg Stress test for new ischemia / ?gi origin , denies CP since admit  for dc today  2. ESRD -  HD TTS on schedule yest.  Hypertension/volume  - bp stable and volume stable uf 1.0liter on hd yest post wt 79 kg  Sl below edw but lowish bp /  Will lower edw  80 kg. on amlodipine 10mg  qday  and coreg 6.25mg   Bid / will lower Amlodipine to 5mg  HS to avoid low bp on hd / am bp 109/65 / monitor at op  [facility] unit  May Dc  Amlodipine if continues lowish  3. Anemia  - HGB 10.6 >11.0 ESA on 5/02  Monitor HGB  4. Metabolic bone disease -  Po Vit d on hd , Auryxia binder  5. HO GERD-Zantac po 6. Disposition- low risk myoview and should be discharged to home today.  Ernest Haber, PA-C [facility] Beeper [phone] 07/27/2017,10:11 AM  LOS: 0 days   Labs: Basic Metabolic Panel: Recent Labs  Lab 07/24/17 1827 07/25/17 0319 07/26/17 0630  NA 136  --  135  K 3.2*  --  4.1  CL 91*  --  89*  CO2 30  --  29  GLUCOSE  104*  --  93  BUN 9  --  40*  CREATININE 4.20* 5.56* 9.07*  CALCIUM 8.9  --  9.1   Liver Function Tests: Recent Labs  Lab 07/25/17 0150 07/26/17 0630  AST 27 25  ALT 17 16*  ALKPHOS 76 78  BILITOT 1.0 0.7  PROT 8.1 8.7*  ALBUMIN 3.9 3.9   Recent Labs  Lab 07/25/17 0150  LIPASE 49   No results for input(s): AMMONIA in the last 168 hours. CBC: Recent Labs  Lab 07/24/17 1827 07/25/17 0150 07/25/17 0319 07/25/17 1125 07/26/17 0630  WBC 10.3 8.8 8.3 9.5 8.1  HGB 11.1* 10.1* 10.5* 11.0* 10.6*  HCT 34.1* 31.3* 32.2* 33.9* 33.3*  MCV 95.3 94.8 95.3 96.6 96.0  PLT 242 220 209 237 234   Cardiac Enzymes: Recent Labs  Lab 07/25/17 0150 07/25/17 0742 07/25/17 1356  TROPONINI <0.03 <0.03 <0.03   CBG: No results for input(s): GLUCAP in the last  168 hours.  Studies/Results: Nm Myocar Multi W/spect W/wall Motion / Ef  Result Date: 07/26/2017  There was no ST segment deviation noted during stress.  No T wave inversion was noted during stress.  Findings consistent with prior myocardial infarction.  This is a low risk study.  The left ventricular ejection fraction is normal (55-65%).  Small inferior wall infarct at mid and basal level with no ischemia EF preserved at 56%   Medications:  . allopurinol  100 mg Oral Daily  . amLODipine  10 mg Oral Daily  . atorvastatin  80 mg Oral q1800  . calcitRIOL  1.5 mcg Oral Daily  . carvedilol  6.25 mg Oral BID WC  . clopidogrel  75 mg Oral Daily  . ferric citrate  210 mg Oral BID WC  . heparin  5,000 Units Subcutaneous Q8H  . isosorbide mononitrate  30 mg Oral Daily  . pantoprazole  40 mg Oral BID AC    I have seen and examined this patient and agree with plan and assessment in the above note with renal recommendations/intervention highlighted.  Broadus John A Zurii Hewes,MD 07/27/2017 12:01 PM

## 2017-07-27 NOTE — Progress Notes (Signed)
Subjective:  Low risk myovue yesterday  No issues with dialysis yesterday  Objective:  Vitals:   07/26/17 1735 07/26/17 1810 07/26/17 2047 07/27/17 0413  BP: (!) 90/54 (!) 118/54 121/62 110/71  Pulse: 66 64 77 72  Resp:  20  18  Temp:  98 F (36.7 C) 98.2 F (36.8 C)   TempSrc:  Oral Oral   SpO2:  98% 95% 100%  Weight:  174 lb 2.6 oz (79 kg)  175 lb 1.6 oz (79.4 kg)  Height:        Intake/Output from previous day:  Intake/Output Summary (Last 24 hours) at 07/27/2017 0750 Last data filed at 07/26/2017 2130 Gross per 24 hour  Intake 120 ml  Output 1000 ml  Net -880 ml    Physical Exam: Affect appropriate Healthy:  appears stated age HEENT: normal Neck supple with no adenopathy JVP normal no bruits no thyromegaly Lungs clear with no wheezing and good diaphragmatic motion Heart:  S1/S2 SEM murmur, no rub, gallop or click PMI normal Abdomen: benighn, BS positve, no tenderness, no AAA no bruit.  No HSM or HJR Distal pulses intact with no bruits No edema Neuro non-focal Skin warm and dry No muscular weakness   Lab Results: Basic Metabolic Panel: Recent Labs    07/24/17 1827 07/25/17 0319 07/26/17 0630  NA 136  --  135  K 3.2*  --  4.1  CL 91*  --  89*  CO2 30  --  29  GLUCOSE 104*  --  93  BUN 9  --  40*  CREATININE 4.20* 5.56* 9.07*  CALCIUM 8.9  --  9.1   Liver Function Tests: Recent Labs    07/25/17 0150 07/26/17 0630  AST 27 25  ALT 17 16*  ALKPHOS 76 78  BILITOT 1.0 0.7  PROT 8.1 8.7*  ALBUMIN 3.9 3.9   Recent Labs    07/25/17 0150  LIPASE 49   CBC: Recent Labs    07/25/17 1125 07/26/17 0630  WBC 9.5 8.1  HGB 11.0* 10.6*  HCT 33.9* 33.3*  MCV 96.6 96.0  PLT 237 234   Cardiac Enzymes: Recent Labs    07/25/17 0150 07/25/17 0742 07/25/17 1356  TROPONINI <0.03 <0.03 <0.03   Imaging: Nm Myocar Multi W/spect W/wall Motion / Ef  Result Date: 07/26/2017  There was no ST segment deviation noted during stress.  No T wave  inversion was noted during stress.  Findings consistent with prior myocardial infarction.  This is a low risk study.  The left ventricular ejection fraction is normal (55-65%).  Small inferior wall infarct at mid and basal level with no ischemia EF preserved at 56%    Cardiac Studies:  ECG: NSR IVCD first degree block no acute ST chagnes    Telemetry:  NSR 07/27/2017   Echo: EF 60-65% MAC AV sclerosis no RWMA;s   Medications:   . allopurinol  100 mg Oral Daily  . amLODipine  10 mg Oral Daily  . atorvastatin  80 mg Oral q1800  . calcitRIOL  1.5 mcg Oral Daily  . carvedilol  6.25 mg Oral BID WC  . clopidogrel  75 mg Oral Daily  . ferric citrate  210 mg Oral BID WC  . heparin  5,000 Units Subcutaneous Q8H  . isosorbide mononitrate  30 mg Oral Daily  . pantoprazole  40 mg Oral BID AC      Assessment/Plan:   Chest Pain:  R/O no acute ECG changes myovue with old  IMI no ischemia medical Rx ok to d/c home  Known severe 3 VD no targets for CABG DES to mid LAD 12/04/16    CRF:  Had dialysis yesterday with no issues  GERD:  On pantoprazole consider carafate or ant acid prior to dialysis   Will sign off should be d/c today    Jenkins Rouge 07/27/2017, 7:50 AM

## 2017-07-29 DIAGNOSIS — D689 Coagulation defect, unspecified: Secondary | ICD-10-CM | POA: Diagnosis not present

## 2017-07-29 DIAGNOSIS — N186 End stage renal disease: Secondary | ICD-10-CM | POA: Diagnosis not present

## 2017-07-29 DIAGNOSIS — N2581 Secondary hyperparathyroidism of renal origin: Secondary | ICD-10-CM | POA: Diagnosis not present

## 2017-07-29 DIAGNOSIS — R079 Chest pain, unspecified: Secondary | ICD-10-CM | POA: Diagnosis not present

## 2017-07-29 DIAGNOSIS — D509 Iron deficiency anemia, unspecified: Secondary | ICD-10-CM | POA: Diagnosis not present

## 2017-07-29 DIAGNOSIS — D631 Anemia in chronic kidney disease: Secondary | ICD-10-CM | POA: Diagnosis not present

## 2017-07-31 DIAGNOSIS — D631 Anemia in chronic kidney disease: Secondary | ICD-10-CM | POA: Diagnosis not present

## 2017-07-31 DIAGNOSIS — D689 Coagulation defect, unspecified: Secondary | ICD-10-CM | POA: Diagnosis not present

## 2017-07-31 DIAGNOSIS — N2581 Secondary hyperparathyroidism of renal origin: Secondary | ICD-10-CM | POA: Diagnosis not present

## 2017-07-31 DIAGNOSIS — N186 End stage renal disease: Secondary | ICD-10-CM | POA: Diagnosis not present

## 2017-07-31 DIAGNOSIS — D509 Iron deficiency anemia, unspecified: Secondary | ICD-10-CM | POA: Diagnosis not present

## 2017-07-31 DIAGNOSIS — R079 Chest pain, unspecified: Secondary | ICD-10-CM | POA: Diagnosis not present

## 2017-07-31 LAB — CULTURE, BLOOD (ROUTINE X 2)
CULTURE: NO GROWTH
Culture: NO GROWTH
Special Requests: ADEQUATE
Special Requests: ADEQUATE

## 2017-08-02 DIAGNOSIS — D509 Iron deficiency anemia, unspecified: Secondary | ICD-10-CM | POA: Diagnosis not present

## 2017-08-02 DIAGNOSIS — R079 Chest pain, unspecified: Secondary | ICD-10-CM | POA: Diagnosis not present

## 2017-08-02 DIAGNOSIS — N2581 Secondary hyperparathyroidism of renal origin: Secondary | ICD-10-CM | POA: Diagnosis not present

## 2017-08-02 DIAGNOSIS — D689 Coagulation defect, unspecified: Secondary | ICD-10-CM | POA: Diagnosis not present

## 2017-08-02 DIAGNOSIS — D631 Anemia in chronic kidney disease: Secondary | ICD-10-CM | POA: Diagnosis not present

## 2017-08-02 DIAGNOSIS — N186 End stage renal disease: Secondary | ICD-10-CM | POA: Diagnosis not present

## 2017-08-04 DIAGNOSIS — I25118 Atherosclerotic heart disease of native coronary artery with other forms of angina pectoris: Secondary | ICD-10-CM | POA: Diagnosis not present

## 2017-08-04 DIAGNOSIS — N186 End stage renal disease: Secondary | ICD-10-CM | POA: Diagnosis not present

## 2017-08-04 DIAGNOSIS — D500 Iron deficiency anemia secondary to blood loss (chronic): Secondary | ICD-10-CM | POA: Diagnosis not present

## 2017-08-04 DIAGNOSIS — I10 Essential (primary) hypertension: Secondary | ICD-10-CM | POA: Diagnosis not present

## 2017-08-04 DIAGNOSIS — E7800 Pure hypercholesterolemia, unspecified: Secondary | ICD-10-CM | POA: Diagnosis not present

## 2017-08-05 DIAGNOSIS — D631 Anemia in chronic kidney disease: Secondary | ICD-10-CM | POA: Diagnosis not present

## 2017-08-05 DIAGNOSIS — N2581 Secondary hyperparathyroidism of renal origin: Secondary | ICD-10-CM | POA: Diagnosis not present

## 2017-08-05 DIAGNOSIS — N186 End stage renal disease: Secondary | ICD-10-CM | POA: Diagnosis not present

## 2017-08-05 DIAGNOSIS — D509 Iron deficiency anemia, unspecified: Secondary | ICD-10-CM | POA: Diagnosis not present

## 2017-08-05 DIAGNOSIS — D689 Coagulation defect, unspecified: Secondary | ICD-10-CM | POA: Diagnosis not present

## 2017-08-05 DIAGNOSIS — R079 Chest pain, unspecified: Secondary | ICD-10-CM | POA: Diagnosis not present

## 2017-08-06 ENCOUNTER — Ambulatory Visit (INDEPENDENT_AMBULATORY_CARE_PROVIDER_SITE_OTHER): Payer: Self-pay | Admitting: Physician Assistant

## 2017-08-06 ENCOUNTER — Other Ambulatory Visit: Payer: Self-pay

## 2017-08-06 ENCOUNTER — Encounter: Payer: Self-pay | Admitting: Physician Assistant

## 2017-08-06 VITALS — BP 117/60 | HR 57 | Temp 97.2°F | Resp 18 | Ht 66.0 in | Wt 184.0 lb

## 2017-08-06 DIAGNOSIS — N186 End stage renal disease: Secondary | ICD-10-CM

## 2017-08-06 DIAGNOSIS — Z992 Dependence on renal dialysis: Secondary | ICD-10-CM

## 2017-08-06 NOTE — Progress Notes (Signed)
History of Present Illness:  Patient is a 77 y.o. year old male who presents for placement of a permanent hemodialysis access. He had a right brachiocephalic fistula placed in January 2019.  He presented with steal symptoms.  On 05/12/2017 he underwent banding of his right brachiocephalic fistula and ligation of competing branch of the fistula.  On 07/04/2017 he underwent Ligation of right brachiocephalic AV fistula secondary to steal syndrome.   He has tingling and decreased sensation in the right hand, bu improved and no more night pain since the ligation.      Past Medical History:  Diagnosis Date  . Anemia   . Anginal pain ([place])   . Arthritis    "all over; bad in the legs" (12/04/2016)  . CAD (coronary artery disease)    a. NSTEMI in setting of anemia; b. 10/2016 MV: inflat ST dep with large, reversible ant, apical, inflat defect; c. 10/2016 Cath: LM nl, LAD 40p, 49m, D1 70, D2 80, RI min irregs, LCX 100ost, RCA 100p, 133m/d, RPDA fills via collats from LAD, RPAV small-->Initial conservative Rx in setting of GIB w/ plan for PCI LAD if H/H stable on ASA/Plavix;  d. 12/04/16 s/p PTCA and DES to mLAD.  Marland Kitchen Depression    situational   . Diastolic dysfunction    a. 10/2016 Echo: EF 55-60%, no rwma, Gr2 DD, triv MR, mildly dil LA.  Marland Kitchen ESRD (end stage renal disease) ([place])    a. 10/2016 HD catheter insterted and HD initiated; b. Pending permanent HD access folllowing Cardiac Revascularization.  . ESRD on dialysis [facility])    "[place]; [address]; TTS usually; going to [facility] in [place], [place] right now while staying w/daughter" (12/04/2016)  . GERD (gastroesophageal reflux disease)   . GIB (gastrointestinal bleeding) 10/2011   a. 10/2016 3u PRBCs - EGD/Colonoscopy: angiodysplasia w/ diverticulosis. No apparent bleeding. Polypecotmy->tubular adenomas.  . Gout   . Headache    no migraines for years  . Heart murmur   . High cholesterol   . History of blood transfusion 10/18/2016   "related to anemia"  (12/04/2016)  . History of kidney stones    years ago  . Hypertension   . Iron deficiency anemia   . Myocardial infarction ([place]) 10/18/2016  . Old MI (myocardial infarction)    "found evidence of this on 10/18/2016"  . PAF (paroxysmal atrial fibrillation) ([place])   . Peripheral vascular disease ([place])    BLE  . Prostate cancer ([place])    a. s/p seed implantation.  . Tobacco abuse     Past Surgical History:  Procedure Laterality Date  . AV FISTULA PLACEMENT Right 03/28/2017   Procedure: ARTERIOVENOUS (AV) BRACHIOCEPHALIC FISTULA CREATION;  Surgeon: Angelia Mould, MD;  Location: [place];  Service: Vascular;  Laterality: Right;  . CARDIAC CATHETERIZATION  10/2016  . COLONOSCOPY WITH PROPOFOL N/A 10/21/2016   Procedure: COLONOSCOPY WITH PROPOFOL;  Surgeon: Gatha Mayer, MD;  Location: [facility] ENDOSCOPY;  Service: Endoscopy;  Laterality: N/A;  . CORONARY ANGIOPLASTY WITH STENT PLACEMENT  12/04/2016   "1 stent"  . CORONARY STENT INTERVENTION N/A 12/04/2016   Procedure: CORONARY STENT INTERVENTION;  Surgeon: Wellington Hampshire, MD;  Location: [place] CV LAB;  Service: Cardiovascular;  Laterality: N/A;  . ESOPHAGOGASTRODUODENOSCOPY N/A 10/21/2016   Procedure: ESOPHAGOGASTRODUODENOSCOPY (EGD);  Surgeon: Gatha Mayer, MD;  Location: [facility] ENDOSCOPY;  Service: Endoscopy;  Laterality: N/A;  . INSERTION OF DIALYSIS CATHETER Right 10/22/2016   Procedure: INSERTION OF TUNNELED DIALYSIS  CATHETER;  Surgeon: Elam Dutch, MD;  Location: [place];  Service: Vascular;  Laterality: Right;  . LEFT HEART CATH AND CORONARY ANGIOGRAPHY N/A 10/23/2016   Procedure: LEFT HEART CATH AND CORONARY ANGIOGRAPHY;  Surgeon: Wellington Hampshire, MD;  Location: [place] CV LAB;  Service: Cardiovascular;  Laterality: N/A;  . LIGATION OF ARTERIOVENOUS  FISTULA Right 05/12/2017   Procedure: BANDING OF RIGHT UPPER ARM ARTERIOVENOUS  FISTULA USING 6MM X 10CM GORE TEX VASCULAR GRAFT;  Surgeon: Angelia Mould, MD;  Location:  [place];  Service: Vascular;  Laterality: Right;  . LIGATION OF ARTERIOVENOUS  FISTULA Right 07/03/2017   Procedure: LIGATION OF ARTERIOVENOUS  FISTULA RIGHT ARM;  Surgeon: Angelia Mould, MD;  Location: [place];  Service: Vascular;  Laterality: Right;  . TONSILLECTOMY       Social History Social History   Tobacco Use  . Smoking status: Former Smoker    Packs/day: 0.50    Years: 50.00    Pack years: 25.00    Types: Cigarettes    Last attempt to quit: 10/18/2016    Years since quitting: 0.8  . Smokeless tobacco: Never Used  Substance Use Topics  . Alcohol use: No  . Drug use: No    Family History Family History  Problem Relation Age of Onset  . Cancer Mother   . Cancer Father   . Heart attack Father   . Diabetes Father   . Hypertension Father   . Cancer Sister   . Cancer Brother   . Diabetes Brother   . Hypertension Brother   . Hypertension Daughter     Allergies  No Known Allergies   Current Outpatient Medications  Medication Sig Dispense Refill  . allopurinol (ZYLOPRIM) 100 MG tablet Take 1 tablet (100 mg total) by mouth daily. 30 tablet 0  . amLODipine (NORVASC) 10 MG tablet Take 1 tablet (10 mg total) by mouth daily. 90 tablet 3  . atorvastatin (LIPITOR) 80 MG tablet Take 1 tablet (80 mg total) by mouth daily at 6 PM. 90 tablet 3  . b complex vitamins tablet Take 1 tablet by mouth See admin instructions. Given at dialysis    . calcitRIOL (ROCALTROL) 0.25 MCG capsule Take 1 capsule (0.25 mcg total) by mouth Every Tuesday,Thursday,and Saturday with dialysis. 30 capsule 0  . carvedilol (COREG) 6.25 MG tablet Take 1 tablet (6.25 mg total) by mouth 2 (two) times daily with a meal. 180 tablet 3  . clopidogrel (PLAVIX) 75 MG tablet Take 1 tablet (75 mg total) by mouth daily. 90 tablet 3  . ferric citrate (AURYXIA) 1 GM 210 MG(Fe) tablet Take 210 mg by mouth 2 (two) times daily.    . isosorbide mononitrate (IMDUR) 30 MG 24 hr tablet Take 30 mg by mouth daily.    .  ranitidine (ZANTAC) 150 MG tablet Take 150 mg by mouth daily.     Marland Kitchen oxyCODONE (ROXICODONE) 5 MG immediate release tablet Take 1 tablet (5 mg total) by mouth every 4 (four) hours as needed for severe pain. (Patient not taking: Reported on 08/06/2017) 10 tablet 0   No current facility-administered medications for this visit.     ROS:   General:  No weight loss, Fever, chills  HEENT: No recent headaches, no nasal bleeding, no visual changes, no sore throat  Neurologic: No dizziness, blackouts, seizures. No recent symptoms of stroke or mini- stroke. No recent episodes of slurred speech, or temporary blindness.  Cardiac: No recent episodes of chest pain/pressure,  no shortness of breath at rest.  No shortness of breath with exertion.  Denies history of atrial fibrillation or irregular heartbeat  Vascular: No history of rest pain in feet.  No history of claudication.  No history of non-healing ulcer, No history of DVT   Pulmonary: No home oxygen, no productive cough, no hemoptysis,  No asthma or wheezing  Musculoskeletal:  [ ]  Arthritis, [ ]  Low back pain,  [ ]  Joint pain  Hematologic:No history of hypercoagulable state.  No history of easy bleeding.  No history of anemia  Gastrointestinal: No hematochezia or melena,  No gastroesophageal reflux, no trouble swallowing  Urinary: [ ]  chronic Kidney disease, [x ] on HD - [ ]  MWF or [ x] TTHS, [ ]  Burning with urination, [ ]  Frequent urination, [ ]  Difficulty urinating;   Skin: No rashes  Psychological: No history of anxiety,  No history of depression   Physical Examination  Vitals:   08/06/17 1538  BP: 117/60  Pulse: (!) 57  Resp: 18  Temp: (!) 97.2 F (36.2 C)  TempSrc: Oral  SpO2: 100%  Weight: 184 lb (83.5 kg)  Height: 5\' 6"  (1.676 m)    Body mass index is 29.7 kg/m.  General:  Alert and oriented, no acute distress HEENT: Normal Neck: No bruit or JVD Pulmonary: Clear to auscultation bilaterally Cardiac: Regular Rate and  Rhythm without murmur Gastrointestinal: Soft, non-tender, non-distended, no mass, no scars Skin: No rash Extremity Pulses:  2+ radial  pulses bilaterally Right UE hand cap refill brisk, grip 4+/5, slight decrease in sensation compared to left hand. Musculoskeletal: No deformity or edema  Neurologic: Upper and lower extremity motor 5/5 and symmetric  DATA:  Reviewed the vein mapping from 10/20/2016 He has an acceptable left UE cephalic vein.   ASSESSMENT:  ESRD on HD via [facility] on T-TH-Sat   PLAN: He has a reasonable left cephalic vein according to the vein mapping performed on 10/20/2016.  We will consider this after further work up. I will order an arterial duplex on the left UE and have him follow up with Dr. Scot Dock to plan his next procedure for permanent access.    His daughter will be out of town until the third week of June, so we scheduled his f/u for June 19th 2019 with Dr. Scot Dock.   Roxy Horseman PA-C [facility]  MD in clinic Dr. Scot Dock

## 2017-08-07 ENCOUNTER — Encounter: Payer: Medicare Other | Admitting: Vascular Surgery

## 2017-08-07 DIAGNOSIS — D509 Iron deficiency anemia, unspecified: Secondary | ICD-10-CM | POA: Diagnosis not present

## 2017-08-07 DIAGNOSIS — N186 End stage renal disease: Secondary | ICD-10-CM | POA: Diagnosis not present

## 2017-08-07 DIAGNOSIS — D631 Anemia in chronic kidney disease: Secondary | ICD-10-CM | POA: Diagnosis not present

## 2017-08-07 DIAGNOSIS — R079 Chest pain, unspecified: Secondary | ICD-10-CM | POA: Diagnosis not present

## 2017-08-07 DIAGNOSIS — N2581 Secondary hyperparathyroidism of renal origin: Secondary | ICD-10-CM | POA: Diagnosis not present

## 2017-08-07 DIAGNOSIS — D689 Coagulation defect, unspecified: Secondary | ICD-10-CM | POA: Diagnosis not present

## 2017-08-09 ENCOUNTER — Other Ambulatory Visit: Payer: Self-pay

## 2017-08-09 ENCOUNTER — Encounter (HOSPITAL_COMMUNITY): Payer: Self-pay | Admitting: *Deleted

## 2017-08-09 ENCOUNTER — Emergency Department (HOSPITAL_COMMUNITY)
Admission: EM | Admit: 2017-08-09 | Discharge: 2017-08-09 | Disposition: A | Discharge status: Home / Self Care | Payer: Medicare Other | Attending: Emergency Medicine | Admitting: Emergency Medicine

## 2017-08-09 ENCOUNTER — Emergency Department (HOSPITAL_COMMUNITY): Payer: Medicare Other

## 2017-08-09 DIAGNOSIS — Z7902 Long term (current) use of antithrombotics/antiplatelets: Secondary | ICD-10-CM | POA: Diagnosis not present

## 2017-08-09 DIAGNOSIS — I5030 Unspecified diastolic (congestive) heart failure: Secondary | ICD-10-CM | POA: Diagnosis not present

## 2017-08-09 DIAGNOSIS — Z79899 Other long term (current) drug therapy: Secondary | ICD-10-CM | POA: Diagnosis not present

## 2017-08-09 DIAGNOSIS — N186 End stage renal disease: Secondary | ICD-10-CM | POA: Insufficient documentation

## 2017-08-09 DIAGNOSIS — D631 Anemia in chronic kidney disease: Secondary | ICD-10-CM | POA: Diagnosis not present

## 2017-08-09 DIAGNOSIS — Z992 Dependence on renal dialysis: Secondary | ICD-10-CM | POA: Insufficient documentation

## 2017-08-09 DIAGNOSIS — N2581 Secondary hyperparathyroidism of renal origin: Secondary | ICD-10-CM | POA: Diagnosis not present

## 2017-08-09 DIAGNOSIS — R079 Chest pain, unspecified: Secondary | ICD-10-CM | POA: Diagnosis not present

## 2017-08-09 DIAGNOSIS — I2510 Atherosclerotic heart disease of native coronary artery without angina pectoris: Secondary | ICD-10-CM | POA: Diagnosis not present

## 2017-08-09 DIAGNOSIS — R0789 Other chest pain: Secondary | ICD-10-CM | POA: Insufficient documentation

## 2017-08-09 DIAGNOSIS — Z8546 Personal history of malignant neoplasm of prostate: Secondary | ICD-10-CM | POA: Diagnosis not present

## 2017-08-09 DIAGNOSIS — I252 Old myocardial infarction: Secondary | ICD-10-CM | POA: Diagnosis not present

## 2017-08-09 DIAGNOSIS — Z87891 Personal history of nicotine dependence: Secondary | ICD-10-CM | POA: Insufficient documentation

## 2017-08-09 DIAGNOSIS — D689 Coagulation defect, unspecified: Secondary | ICD-10-CM | POA: Diagnosis not present

## 2017-08-09 DIAGNOSIS — D509 Iron deficiency anemia, unspecified: Secondary | ICD-10-CM | POA: Diagnosis not present

## 2017-08-09 DIAGNOSIS — I132 Hypertensive heart and chronic kidney disease with heart failure and with stage 5 chronic kidney disease, or end stage renal disease: Secondary | ICD-10-CM | POA: Diagnosis not present

## 2017-08-09 DIAGNOSIS — R072 Precordial pain: Secondary | ICD-10-CM | POA: Diagnosis not present

## 2017-08-09 LAB — CBC WITH DIFFERENTIAL/PLATELET
BASOS PCT: 0 %
Basophils Absolute: 0 10*3/uL (ref 0.0–0.1)
Eosinophils Absolute: 0.1 10*3/uL (ref 0.0–0.7)
Eosinophils Relative: 2 %
HCT: 36.7 % — ABNORMAL LOW (ref 39.0–52.0)
Hemoglobin: 11.7 g/dL — ABNORMAL LOW (ref 13.0–17.0)
LYMPHS ABS: 1.7 10*3/uL (ref 0.7–4.0)
Lymphocytes Relative: 26 %
MCH: 32.5 pg (ref 26.0–34.0)
MCHC: 31.9 g/dL (ref 30.0–36.0)
MCV: 101.9 fL — AB (ref 78.0–100.0)
MONO ABS: 0.5 10*3/uL (ref 0.1–1.0)
Monocytes Relative: 8 %
Neutro Abs: 4.3 10*3/uL (ref 1.7–7.7)
Neutrophils Relative %: 64 %
PLATELETS: 235 10*3/uL (ref 150–400)
RBC: 3.6 MIL/uL — ABNORMAL LOW (ref 4.22–5.81)
RDW: 21 % — ABNORMAL HIGH (ref 11.5–15.5)
WBC: 6.6 10*3/uL (ref 4.0–10.5)

## 2017-08-09 LAB — I-STAT CHEM 8, ED
BUN: 11 mg/dL (ref 6–20)
CALCIUM ION: 0.91 mmol/L — AB (ref 1.15–1.40)
Chloride: 93 mmol/L — ABNORMAL LOW (ref 101–111)
Creatinine, Ser: 3.9 mg/dL — ABNORMAL HIGH (ref 0.61–1.24)
GLUCOSE: 90 mg/dL (ref 65–99)
HCT: 38 % — ABNORMAL LOW (ref 39.0–52.0)
Hemoglobin: 12.9 g/dL — ABNORMAL LOW (ref 13.0–17.0)
Potassium: 3.9 mmol/L (ref 3.5–5.1)
Sodium: 137 mmol/L (ref 135–145)
TCO2: 35 mmol/L — ABNORMAL HIGH (ref 22–32)

## 2017-08-09 LAB — I-STAT TROPONIN, ED
TROPONIN I, POC: 0.02 ng/mL (ref 0.00–0.08)
Troponin i, poc: 0 ng/mL (ref 0.00–0.08)

## 2017-08-09 NOTE — ED Provider Notes (Signed)
[facility] EMERGENCY DEPARTMENT Provider Note   CSN: [id number] Arrival date & time: 08/09/17  1618     History   Chief Complaint No chief complaint on file.   HPI Roger [name] is a 77 y.o. male.  P/w chest pain that started about 2 hours into his dialysis session. Dialysis was stopped and he was sent here. Has been occurring ever since he started dialysis in Aug 2018. Happens almost every time. Today's episode was a little more severe than usual. No SOB.  The history is provided by the patient and the EMS personnel.  Chest Pain   This is a recurrent problem. The current episode started less than 1 hour ago. The problem occurs every several days. The problem has been gradually improving. Associated with: Occurs with dialysis after about 2 hours. The pain is present in the substernal region. The pain is moderate. The quality of the pain is described as pressure-like. The pain does not radiate. Duration of episode(s) is 1 hour. Exacerbated by: dialysis. Pertinent negatives include no abdominal pain, no back pain, no cough, no fever, no palpitations, no shortness of breath and no vomiting. He has tried nothing for the symptoms.  His past medical history is significant for CAD, CHF and hypertension.  Pertinent negatives for past medical history include no seizures.    Past Medical History:  Diagnosis Date  . Anemia   . Anginal pain ([place])   . Arthritis    "all over; bad in the legs" (12/04/2016)  . CAD (coronary artery disease)    a. NSTEMI in setting of anemia; b. 10/2016 MV: inflat ST dep with large, reversible ant, apical, inflat defect; c. 10/2016 Cath: LM nl, LAD 40p, 38m, D1 70, D2 80, RI min irregs, LCX 100ost, RCA 100p, 118m/d, RPDA fills via collats from LAD, RPAV small-->Initial conservative Rx in setting of GIB w/ plan for PCI LAD if H/H stable on ASA/Plavix;  d. 12/04/16 s/p PTCA and DES to mLAD.  Marland Kitchen Depression    situational   . Diastolic dysfunction    a.  10/2016 Echo: EF 55-60%, no rwma, Gr2 DD, triv MR, mildly dil LA.  Marland Kitchen ESRD (end stage renal disease) ([place])    a. 10/2016 HD catheter insterted and HD initiated; b. Pending permanent HD access folllowing Cardiac Revascularization.  . ESRD on dialysis [facility])    "[place]; [address]; TTS usually; going to [facility] in [place], [place] right now while staying w/daughter" (12/04/2016)  . GERD (gastroesophageal reflux disease)   . GIB (gastrointestinal bleeding) 10/2011   a. 10/2016 3u PRBCs - EGD/Colonoscopy: angiodysplasia w/ diverticulosis. No apparent bleeding. Polypecotmy->tubular adenomas.  . Gout   . Headache    no migraines for years  . Heart murmur   . High cholesterol   . History of blood transfusion 10/18/2016   "related to anemia" (12/04/2016)  . History of kidney stones    years ago  . Hypertension   . Iron deficiency anemia   . Myocardial infarction ([place]) 10/18/2016  . Old MI (myocardial infarction)    "found evidence of this on 10/18/2016"  . PAF (paroxysmal atrial fibrillation) ([place])   . Peripheral vascular disease ([place])    BLE  . Prostate cancer ([place])    a. s/p seed implantation.  . Tobacco abuse     Patient Active Problem List   Diagnosis Date Noted  . Chest pain 07/25/2017  . CAD (coronary artery disease) 07/25/2017  . ESRD (end stage renal disease) (  [place]) 07/25/2017  . Aortic valve sclerosis   . Essential hypertension   . Pure hypercholesterolemia   . Right hand pain 05/28/2017  . Angiodysplasia of cecum   . Benign neoplasm of cecum   . Benign neoplasm of ascending colon   . Benign neoplasm of descending colon   . CKD (chronic kidney disease) stage 5, GFR less than 15 ml/min ([facility])   . Melena   . Demand ischemia ([place])   . Lower GI bleed   . Tobacco abuse   . Angina pectoris ([place]) 10/18/2016  . ARF (acute renal failure) ([place]) 10/18/2016  . Hyperkalemia 10/18/2016  . Blood loss anemia 10/18/2016  . Elevated troponin 10/18/2016  . Atherosclerotic PVD with intermittent  claudication ([place]) 12/29/2013  . Peripheral vascular disease, unspecified ([place]) 12/09/2012    Past Surgical History:  Procedure Laterality Date  . AV FISTULA PLACEMENT Right 03/28/2017   Procedure: ARTERIOVENOUS (AV) BRACHIOCEPHALIC FISTULA CREATION;  Surgeon: Angelia Mould, MD;  Location: [place];  Service: Vascular;  Laterality: Right;  . CARDIAC CATHETERIZATION  10/2016  . COLONOSCOPY WITH PROPOFOL N/A 10/21/2016   Procedure: COLONOSCOPY WITH PROPOFOL;  Surgeon: Gatha Mayer, MD;  Location: [facility] ENDOSCOPY;  Service: Endoscopy;  Laterality: N/A;  . CORONARY ANGIOPLASTY WITH STENT PLACEMENT  12/04/2016   "1 stent"  . CORONARY STENT INTERVENTION N/A 12/04/2016   Procedure: CORONARY STENT INTERVENTION;  Surgeon: Wellington Hampshire, MD;  Location: [place] CV LAB;  Service: Cardiovascular;  Laterality: N/A;  . ESOPHAGOGASTRODUODENOSCOPY N/A 10/21/2016   Procedure: ESOPHAGOGASTRODUODENOSCOPY (EGD);  Surgeon: Gatha Mayer, MD;  Location: [facility] ENDOSCOPY;  Service: Endoscopy;  Laterality: N/A;  . INSERTION OF DIALYSIS CATHETER Right 10/22/2016   Procedure: INSERTION OF TUNNELED DIALYSIS CATHETER;  Surgeon: Elam Dutch, MD;  Location: [place];  Service: Vascular;  Laterality: Right;  . LEFT HEART CATH AND CORONARY ANGIOGRAPHY N/A 10/23/2016   Procedure: LEFT HEART CATH AND CORONARY ANGIOGRAPHY;  Surgeon: Wellington Hampshire, MD;  Location: [place] CV LAB;  Service: Cardiovascular;  Laterality: N/A;  . LIGATION OF ARTERIOVENOUS  FISTULA Right 05/12/2017   Procedure: BANDING OF RIGHT UPPER ARM ARTERIOVENOUS  FISTULA USING 6MM X 10CM GORE TEX VASCULAR GRAFT;  Surgeon: Angelia Mould, MD;  Location: [place];  Service: Vascular;  Laterality: Right;  . LIGATION OF ARTERIOVENOUS  FISTULA Right 07/03/2017   Procedure: LIGATION OF ARTERIOVENOUS  FISTULA RIGHT ARM;  Surgeon: Angelia Mould, MD;  Location: [place];  Service: Vascular;  Laterality: Right;  . TONSILLECTOMY          Home  Medications    Prior to Admission medications   Medication Sig Start Date End Date Taking? Authorizing Provider  allopurinol (ZYLOPRIM) 100 MG tablet Take 1 tablet (100 mg total) by mouth daily. 10/26/16  Yes Florencia Reasons, MD  amLODipine (NORVASC) 10 MG tablet Take 1 tablet (10 mg total) by mouth daily. 11/26/16  Yes Theora Gianotti, NP  atorvastatin (LIPITOR) 80 MG tablet Take 1 tablet (80 mg total) by mouth daily at 6 PM. 11/26/16  Yes Theora Gianotti, NP  b complex vitamins tablet Take 1 tablet by mouth See admin instructions. Given at dialysis   Yes [provider]  calcitRIOL (ROCALTROL) 0.25 MCG capsule Take 1 capsule (0.25 mcg total) by mouth Every Tuesday,Thursday,and Saturday with dialysis. 12/05/16  Yes Bhagat, Bhavinkumar, PA  carvedilol (COREG) 6.25 MG tablet Take 1 tablet (6.25 mg total) by mouth 2 (two) times daily with a meal. 04/25/17  Yes Wellington Hampshire, MD  clopidogrel (PLAVIX) 75 MG tablet Take 1 tablet (75 mg total) by mouth daily. 11/26/16  Yes Theora Gianotti, NP  ferric citrate (AURYXIA) 1 GM 210 MG(Fe) tablet Take 210 mg by mouth 2 (two) times daily.   Yes [provider]  isosorbide mononitrate (IMDUR) 30 MG 24 hr tablet Take 30 mg by mouth daily. 01/24/17  Yes [provider]  ranitidine (ZANTAC) 150 MG tablet Take 150 mg by mouth daily as needed for heartburn.    Yes [provider]  oxyCODONE (ROXICODONE) 5 MG immediate release tablet Take 1 tablet (5 mg total) by mouth every 4 (four) hours as needed for severe pain. Patient not taking: Reported on 08/06/2017 07/03/17   Angelia Mould, MD    Family History Family History  Problem Relation Age of Onset  . Cancer Mother   . Cancer Father   . Heart attack Father   . Diabetes Father   . Hypertension Father   . Cancer Sister   . Cancer Brother   . Diabetes Brother   . Hypertension Brother   . Hypertension Daughter     Social History Social History     Tobacco Use  . Smoking status: Former Smoker    Packs/day: 0.50    Years: 50.00    Pack years: 25.00    Types: Cigarettes    Last attempt to quit: 10/18/2016    Years since quitting: 0.8  . Smokeless tobacco: Never Used  Substance Use Topics  . Alcohol use: No  . Drug use: No     Allergies   Patient has no known allergies.   Review of Systems Review of Systems  Constitutional: Negative for chills and fever.  HENT: Negative for ear pain and sore throat.   Eyes: Negative for pain and visual disturbance.  Respiratory: Negative for cough and shortness of breath.   Cardiovascular: Positive for chest pain. Negative for palpitations.  Gastrointestinal: Negative for abdominal pain and vomiting.  Genitourinary: Negative for dysuria and hematuria.  Musculoskeletal: Negative for arthralgias and back pain.  Skin: Negative for color change and rash.  Neurological: Negative for seizures and syncope.  All other systems reviewed and are negative.    Physical Exam Updated Vital Signs BP (!) 106/57   Pulse 63   Temp 98.3 F (36.8 C) (Oral)   Resp 16   Ht 5\' 6"  (1.676 m)   Wt 83.5 kg (184 lb)   SpO2 98%   BMI 29.70 kg/m   Physical Exam  Constitutional: He is oriented to person, place, and time. He appears well-developed and well-nourished.  HENT:  Head: Normocephalic and atraumatic.  Eyes: Conjunctivae and EOM are normal.  Neck: Neck supple.  Cardiovascular: Normal rate and regular rhythm.  No murmur heard. Pulmonary/Chest: Effort normal and breath sounds normal. No respiratory distress.  Abdominal: Soft. There is no tenderness.  Musculoskeletal: He exhibits no edema, tenderness or deformity.  Neurological: He is alert and oriented to person, place, and time.  Skin: Skin is warm and dry.  Psychiatric: He has a normal mood and affect.  Nursing note and vitals reviewed.    ED Treatments / Results  Labs (all labs ordered are listed, but only abnormal results are  displayed) Labs Reviewed  CBC WITH DIFFERENTIAL/PLATELET - Abnormal; Notable for the following components:      Result Value   RBC 3.60 (*)    Hemoglobin 11.7 (*)    HCT 36.7 (*)  MCV 101.9 (*)    RDW 21.0 (*)    All other components within normal limits  I-STAT CHEM 8, ED - Abnormal; Notable for the following components:   Chloride 93 (*)    Creatinine, Ser 3.90 (*)    Calcium, Ion 0.91 (*)    TCO2 35 (*)    Hemoglobin 12.9 (*)    HCT 38.0 (*)    All other components within normal limits  I-STAT TROPONIN, ED  I-STAT TROPONIN, ED    EKG EKG Interpretation  Date/Time:  Saturday Aug 09 2017 16:32:19 EDT Ventricular Rate:  60 PR Interval:    QRS Duration: 127 QT Interval:  478 QTC Calculation: 478 R Axis:   56 Text Interpretation:  Sinus rhythm Prolonged PR interval No STEMI. Similar to prior.  Confirmed by Nanda [place] [phone]) on 08/09/2017 5:15:06 PM   Radiology Dg Chest Portable 1 View  Result Date: 08/09/2017 CLINICAL DATA:  Chest pain during dialysis. EXAM: PORTABLE CHEST 1 VIEW COMPARISON:  Chest radiograph 07/24/2017 FINDINGS: Dialysis catheter in unchanged position, tip at the expected location of the cavoatrial junction. Cardiomediastinal silhouette is normal. Mediastinal contours appear intact. Calcific atherosclerotic disease of the aorta. There is no evidence of focal airspace consolidation, pleural effusion or pneumothorax. Osseous structures are without acute abnormality. Soft tissues are grossly normal. IMPRESSION: No active disease. Calcific atherosclerotic disease of the aorta. Electronically Signed   By: Fidela Salisbury M.D.   On: 08/09/2017 16:45    Procedures Procedures (including critical care time)  Medications Ordered in ED Medications - No data to display   Initial Impression / Assessment and Plan / ED Course  I have reviewed the triage vital signs and the nursing notes.  Pertinent labs & imaging results that were available during my care  of the patient were reviewed by me and considered in my medical decision making (see chart for details).    Patient is a 77 year old male with history as above who presents with chest pain that started while he was getting dialysis.  He reports that he has been having this pain frequently and only during dialysis.  He has been on dialysis since last year.  He was admitted here a couple of weeks ago after a similar presentation.  He had a negative stress test at that time.  Here, patient is very well-appearing.  His initial troponin is negative.  EKG does not show any acute ischemic changes.  His delta troponin is also negative.  Given his recent negative work-up, the chronicity of his symptoms, and the fact that they occur only with dialysis and then resolved, I am not concerned about ACS at this time.  He is stable for discharge.  I discussed these findings with the patient.  Return precautions discussed in detail.  Final Clinical Impressions(s) / ED Diagnoses   Final diagnoses:  Chest pain, unspecified type    ED Discharge Orders    None       Clifton James, MD 08/09/17 2300    Margette Fast, MD 08/09/17 2352

## 2017-08-09 NOTE — ED Triage Notes (Signed)
PT here via [facility].  Pt was 2 hours into dialysis when began experiencing chest pain 6/10.  Associates chest pain with dialysis.  Pain is non-radiating.  Given 324 asa en-route.  110/80, hr 60, rr 16, 100% 2L (placed on 2L by dialysis d/t chest pain).

## 2017-08-09 NOTE — ED Notes (Signed)
Pt stable, ambulatory, states understanding of discharge instructions, son at bedside.

## 2017-08-12 DIAGNOSIS — D631 Anemia in chronic kidney disease: Secondary | ICD-10-CM | POA: Diagnosis not present

## 2017-08-12 DIAGNOSIS — N186 End stage renal disease: Secondary | ICD-10-CM | POA: Diagnosis not present

## 2017-08-12 DIAGNOSIS — N2581 Secondary hyperparathyroidism of renal origin: Secondary | ICD-10-CM | POA: Diagnosis not present

## 2017-08-12 DIAGNOSIS — D509 Iron deficiency anemia, unspecified: Secondary | ICD-10-CM | POA: Diagnosis not present

## 2017-08-12 DIAGNOSIS — D689 Coagulation defect, unspecified: Secondary | ICD-10-CM | POA: Diagnosis not present

## 2017-08-12 DIAGNOSIS — R079 Chest pain, unspecified: Secondary | ICD-10-CM | POA: Diagnosis not present

## 2017-08-13 ENCOUNTER — Other Ambulatory Visit: Payer: Self-pay

## 2017-08-13 DIAGNOSIS — N186 End stage renal disease: Secondary | ICD-10-CM

## 2017-08-13 DIAGNOSIS — Z992 Dependence on renal dialysis: Principal | ICD-10-CM

## 2017-08-14 DIAGNOSIS — N186 End stage renal disease: Secondary | ICD-10-CM | POA: Diagnosis not present

## 2017-08-14 DIAGNOSIS — R079 Chest pain, unspecified: Secondary | ICD-10-CM | POA: Diagnosis not present

## 2017-08-14 DIAGNOSIS — D689 Coagulation defect, unspecified: Secondary | ICD-10-CM | POA: Diagnosis not present

## 2017-08-14 DIAGNOSIS — D631 Anemia in chronic kidney disease: Secondary | ICD-10-CM | POA: Diagnosis not present

## 2017-08-14 DIAGNOSIS — D509 Iron deficiency anemia, unspecified: Secondary | ICD-10-CM | POA: Diagnosis not present

## 2017-08-14 DIAGNOSIS — N2581 Secondary hyperparathyroidism of renal origin: Secondary | ICD-10-CM | POA: Diagnosis not present

## 2017-08-14 NOTE — Progress Notes (Signed)
Cardiology Office Note Date:  08/15/2017  Patient ID:  Roger, [name] [birth year], MRN [id number] PCP:  Lujean Amel, MD  Cardiologist:  Dr. Fletcher Anon, MD    Chief Complaint: Hospital/ED follow-up  History of Present Illness: Roger [name] is a 77 y.o. male with history of multivessel CAD status post PCI/DES to the LAD as detailed below, PAF not on full dose anticoagulation secondary to recurrent GI bleeding, chronic diastolic CHF, ESRD on HD (TTS), PAD, anemia of chronic disease, HTN, HLD, and tobacco abuse who presents for hospital follow up after recent admission to [facility] from 5/10 to 5/12 for chest pain and belching in the setting of hemodialysis as well as for recent ED evaluation on 5/25 for return of chest pain and belching in the setting of hemodialysis.   Patient was admitted to the hospital on 10/2016 with unstable angina in the setting of a GI bleed.  LHC was done which showed significant three-vessel CAD with a chronically occluded RCA with left-to-right collaterals, chronically occluded proximal LCx which seemed to be a small vessel with left to left collaterals, and a 70% stenosis of the mid LAD.  LVEDP was moderately elevated.  There were no good targets for CABG except in the LAD territory.  Subsequently, the patient underwent staged PCI/DES to the LAD and 11/2016.  TTE showed EF 55 to 60%, no regional wall motion abnormalities, mild focal basal hypertrophy of the septum, grade 2 diastolic dysfunction, trivial mitral regurgitation, mildly dilated left atrium, right ventricular systolic function normal, no tricuspid regurgitation.  The patient had recurrent GI bleed and dual antiplatelet therapy was interrupted.  In the setting, he has been maintained on monotherapy with Plavix without further bleeding.  He has had intermittent ED visits and hospitalizations for chest discomfort and belching which are correlated with his dialysis sessions.  With these evaluations, his troponin has  been normal.  Patient was most recently admitted to [facility] from 510 through 5/12 for chest discomfort and belching noted during his outpatient hemodialysis session.  Cardiac enzymes were negative.  TTE on 5/10 showed an EF of 60 to 65%, mild LVH, no regional wall motion abnormalities, grade 1 diastolic dysfunction, concern for possible vegetation on the aortic and mitral valves, mildly dilated left atrium, normal CVP.   Initially, the patient's echocardiogram was read out as possible vegetations on the aortic and mitral valves, suspicious for endocarditis.  However upon independent cardiologist review of these images during the hospital admission it was felt the patient has significant aortic valve sclerosis without stenosis with heavily calcified aortic valve leaflets and the left coronary cusp appeared to be fixed.  This was felt to be consistent with senile calcific aortic valve disease and it was not significantly changed from prior echocardiogram.  The mitral valve did have some calcification with what appeared to be redundant chordae tendon with some calcification.  Given that the patient was afebrile, did not have a leukocytosis, and had negative blood cultures x2 this was not felt to be actually bacterial endocarditis as outlined initially in the TTE report.  Patient underwent Myoview on 07/26/2017 that showed a small inferior wall infarct at the mid and basal level without ischemia, finding was consistent with prior MI, EF 55 to 65%, no ST segment deviation was noted during stress, overall this was a low risk study.  Patient returned to the [facility] ED on 5/25 with return of chest discomfort and belching again associated with his hemodialysis session.  Cardiac  enzymes are negative x2.  Hemoglobin was stable at 11.7.  WBC was normal.  Patient was discharged to outpatient follow-up.  Patient reports since his flow rate has been decreased from 600-400 during his outpatient dialysis sessions he has not  had any further chest pain or belching.  He has felt significantly better with this new fluid removal rate.  Blood pressures have stabilized.  He has not missed any dialysis sessions.  Weight is stable.  Breathing is stable.  Patient indicates he has never had chest discomfort or belching while undergoing hemodialysis in the hospital setting.   Past Medical History:  Diagnosis Date  . Anemia   . Anginal pain ([place])   . Arthritis    "all over; bad in the legs" (12/04/2016)  . CAD (coronary artery disease)    a. NSTEMI in setting of anemia; b. 10/2016 MV: inflat ST dep with large, reversible ant, apical, inflat defect; c. 10/2016 Cath: LM nl, LAD 40p, 66m, D1 70, D2 80, RI min irregs, LCX 100ost, RCA 100p, 112m/d, RPDA fills via collats from LAD, RPAV small-->Initial conservative Rx in setting of GIB w/ plan for PCI LAD if H/H stable on ASA/Plavix;  d. 12/04/16 s/p PTCA and DES to mLAD.  Marland Kitchen Depression    situational   . Diastolic dysfunction    a. 10/2016 Echo: EF 55-60%, no rwma, Gr2 DD, triv MR, mildly dil LA.  Marland Kitchen ESRD (end stage renal disease) ([place])    a. 10/2016 HD catheter insterted and HD initiated; b. Pending permanent HD access folllowing Cardiac Revascularization.  . ESRD on dialysis [facility])    "[place]; [address]; TTS usually; going to [facility] in [place], [place] right now while staying w/daughter" (12/04/2016)  . GERD (gastroesophageal reflux disease)   . GIB (gastrointestinal bleeding) 10/2011   a. 10/2016 3u PRBCs - EGD/Colonoscopy: angiodysplasia w/ diverticulosis. No apparent bleeding. Polypecotmy->tubular adenomas.  . Gout   . Headache    no migraines for years  . Heart murmur   . High cholesterol   . History of blood transfusion 10/18/2016   "related to anemia" (12/04/2016)  . History of kidney stones    years ago  . Hypertension   . Iron deficiency anemia   . Myocardial infarction ([place]) 10/18/2016  . Old MI (myocardial infarction)    "found evidence of this on 10/18/2016"  . PAF  (paroxysmal atrial fibrillation) ([place])   . Peripheral vascular disease ([place])    BLE  . Prostate cancer ([place])    a. s/p seed implantation.  . Tobacco abuse     Past Surgical History:  Procedure Laterality Date  . AV FISTULA PLACEMENT Right 03/28/2017   Procedure: ARTERIOVENOUS (AV) BRACHIOCEPHALIC FISTULA CREATION;  Surgeon: Angelia Mould, MD;  Location: [place];  Service: Vascular;  Laterality: Right;  . CARDIAC CATHETERIZATION  10/2016  . COLONOSCOPY WITH PROPOFOL N/A 10/21/2016   Procedure: COLONOSCOPY WITH PROPOFOL;  Surgeon: Gatha Mayer, MD;  Location: [facility] ENDOSCOPY;  Service: Endoscopy;  Laterality: N/A;  . CORONARY ANGIOPLASTY WITH STENT PLACEMENT  12/04/2016   "1 stent"  . CORONARY STENT INTERVENTION N/A 12/04/2016   Procedure: CORONARY STENT INTERVENTION;  Surgeon: Wellington Hampshire, MD;  Location: [place] CV LAB;  Service: Cardiovascular;  Laterality: N/A;  . ESOPHAGOGASTRODUODENOSCOPY N/A 10/21/2016   Procedure: ESOPHAGOGASTRODUODENOSCOPY (EGD);  Surgeon: Gatha Mayer, MD;  Location: [facility] ENDOSCOPY;  Service: Endoscopy;  Laterality: N/A;  . INSERTION OF DIALYSIS CATHETER Right 10/22/2016   Procedure: INSERTION OF TUNNELED DIALYSIS  CATHETER;  Surgeon: Elam Dutch, MD;  Location: [place];  Service: Vascular;  Laterality: Right;  . LEFT HEART CATH AND CORONARY ANGIOGRAPHY N/A 10/23/2016   Procedure: LEFT HEART CATH AND CORONARY ANGIOGRAPHY;  Surgeon: Wellington Hampshire, MD;  Location: [place] CV LAB;  Service: Cardiovascular;  Laterality: N/A;  . LIGATION OF ARTERIOVENOUS  FISTULA Right 05/12/2017   Procedure: BANDING OF RIGHT UPPER ARM ARTERIOVENOUS  FISTULA USING 6MM X 10CM GORE TEX VASCULAR GRAFT;  Surgeon: Angelia Mould, MD;  Location: [place];  Service: Vascular;  Laterality: Right;  . LIGATION OF ARTERIOVENOUS  FISTULA Right 07/03/2017   Procedure: LIGATION OF ARTERIOVENOUS  FISTULA RIGHT ARM;  Surgeon: Angelia Mould, MD;  Location: [place];  Service:  Vascular;  Laterality: Right;  . TONSILLECTOMY      Current Meds  Medication Sig  . allopurinol (ZYLOPRIM) 100 MG tablet Take 1 tablet (100 mg total) by mouth daily.  Marland Kitchen amLODipine (NORVASC) 10 MG tablet Take 1 tablet (10 mg total) by mouth daily.  Marland Kitchen atorvastatin (LIPITOR) 80 MG tablet Take 1 tablet (80 mg total) by mouth daily at 6 PM.  . b complex vitamins tablet Take 1 tablet by mouth See admin instructions. Given at dialysis  . calcitRIOL (ROCALTROL) 0.25 MCG capsule Take 1 capsule (0.25 mcg total) by mouth Every Tuesday,Thursday,and Saturday with dialysis.  Marland Kitchen carvedilol (COREG) 6.25 MG tablet Take 1 tablet (6.25 mg total) by mouth 2 (two) times daily with a meal.  . clopidogrel (PLAVIX) 75 MG tablet Take 1 tablet (75 mg total) by mouth daily.  . ferric citrate (AURYXIA) 1 GM 210 MG(Fe) tablet Take 210 mg by mouth 2 (two) times daily.  . isosorbide mononitrate (IMDUR) 30 MG 24 hr tablet Take 30 mg by mouth daily.  Marland Kitchen oxyCODONE (ROXICODONE) 5 MG immediate release tablet Take 1 tablet (5 mg total) by mouth every 4 (four) hours as needed for severe pain.  . ranitidine (ZANTAC) 150 MG tablet Take 150 mg by mouth daily as needed for heartburn.     Allergies:   Patient has no known allergies.   Social History:  The patient  reports that he quit smoking about 9 months ago. His smoking use included cigarettes. He has a 25.00 pack-year smoking history. He has never used smokeless tobacco. He reports that he does not drink alcohol or use drugs.   Family History:  The patient's family history includes Cancer in his brother, father, mother, and sister; Diabetes in his brother and father; Heart attack in his father; Hypertension in his brother, daughter, and father.  ROS:   Review of Systems  Constitutional: Positive for malaise/fatigue. Negative for chills, diaphoresis, fever and weight loss.  HENT: Negative for congestion.   Eyes: Negative for discharge and redness.  Respiratory: Negative for  cough, hemoptysis, sputum production, shortness of breath and wheezing.   Cardiovascular: Positive for chest pain. Negative for palpitations, orthopnea, claudication, leg swelling and PND.  Gastrointestinal: Negative for abdominal pain, blood in stool, heartburn, melena, nausea and vomiting.       Belching  Genitourinary: Negative for hematuria.  Musculoskeletal: Negative for falls and myalgias.  Skin: Negative for rash.  Neurological: Positive for weakness. Negative for dizziness, tingling, tremors, sensory change, speech change, focal weakness and loss of consciousness.  Endo/Heme/Allergies: Does not bruise/bleed easily.  Psychiatric/Behavioral: Negative for substance abuse. The patient is not nervous/anxious.   All other systems reviewed and are negative.    PHYSICAL EXAM:  VS:  BP 112/60 (BP Location: Left Arm, Patient Position: Sitting, Cuff Size: Normal)   Pulse (!) 59   Ht 5\' 6"  (1.676 m)   Wt 180 lb 8 oz (81.9 kg)   BMI 29.13 kg/m  BMI: Body mass index is 29.13 kg/m.  Physical Exam  Constitutional: He is oriented to person, place, and time. He appears well-developed and well-nourished.  HENT:  Head: Normocephalic and atraumatic.  Eyes: Right eye exhibits no discharge. Left eye exhibits no discharge.  Neck: Normal range of motion. No JVD present.  Cardiovascular: Normal rate, regular rhythm, S1 normal and S2 normal. Exam reveals no distant heart sounds, no friction rub, no midsystolic click and no opening snap.  Murmur heard.  Harsh midsystolic murmur is present with a grade of 2/6 at the upper right sternal border radiating to the neck. Pulses:      Posterior tibial pulses are 1+ on the right side, and 1+ on the left side.  Pulmonary/Chest: Effort normal and breath sounds normal. No respiratory distress. He has no decreased breath sounds. He has no wheezes. He has no rales. He exhibits no tenderness.  Abdominal: Soft. He exhibits no distension. There is no tenderness.    Musculoskeletal: He exhibits no edema.  Neurological: He is alert and oriented to person, place, and time.  Skin: Skin is warm and dry. No cyanosis. Nails show no clubbing.  Psychiatric: He has a normal mood and affect. His speech is normal and behavior is normal. Judgment and thought content normal.     EKG:  Was ordered and interpreted by me today. Shows sinus bradycardia, 59 bpm, first-degree AV block, inferior Q waves, no acute ST-T changes  Recent Labs: 10/19/2016: Magnesium 2.0; TSH 2.539 07/26/2017: ALT 16 08/09/2017: BUN 11; Creatinine, Ser 3.90; Hemoglobin 12.9; Platelets 235; Potassium 3.9; Sodium 137  10/20/2016: Cholesterol 208; HDL 23; LDL Cholesterol 140; Total CHOL/HDL Ratio 9.0; Triglycerides 225; VLDL 45   Estimated Creatinine Clearance: 16.7 mL/min (A) (by C-G formula based on SCr of 3.9 mg/dL (H)).   Wt Readings from Last 3 Encounters:  08/15/17 180 lb 8 oz (81.9 kg)  08/09/17 184 lb (83.5 kg)  08/06/17 184 lb (83.5 kg)     Other studies reviewed: Additional studies/records reviewed today include: summarized above  ASSESSMENT AND PLAN:  1. CAD of the native coronary arteries with stable angina: It is previously been felt that his chest discomfort and belching is his anginal equivalent.  Since his flow rate has been decreased in the outpatient hemodialysis setting following his hospital admission and ED visit as above he has not had any further symptoms.  Perhaps it would be best to have the patient dialyzed at a slower flow rate, however I will defer this decision to nephrology.  He has known residual occlusion of the RCA and left circumflex with collaterals as detailed above.  He has been managed medically on Plavix monotherapy in the setting of recurrent GI bleed.  He has not had any further symptoms concerning for angina since the above change in his flow rate.  For now, we will continue medical therapy with Imdur, carvedilol, Plavix, Norvasc, and Lipitor.  2. Valvular  heart disease: TTE from 07/25/2017 initially read as possible vegetations on the mitral and aortic valves.  However upon independent cardiologist review and comparison to prior study as well as in the setting of an afebrile patient with out a leukocytosis and negative blood cultures x2, this was not felt to be consistent with endocarditis.  Continue  to monitor clinically.  No objective evidence at the office today for infection.  3. End-stage renal disease: On hemodialysis.  Management of fluid removal per nephrology.  4. Anemia of chronic disease/recurrent GI bleed: Most recent CBC demonstrated a stable hemoglobin.  5. Hyperlipidemia: Continue Lipitor.  6. Hypertension: Blood pressure is well controlled today.  Continue current medications.  7. Carotid bruit: Carotid ultrasound has been ordered.  Disposition: F/u with Dr. Fletcher Anon in 6 months.  Current medicines are reviewed at length with the patient today.  The patient did not have any concerns regarding medicines.  Signed, Christell Faith, PA-C 08/15/2017 2:24 PM     [address] [place], [place] [address] [phone]

## 2017-08-15 ENCOUNTER — Encounter: Payer: Self-pay | Admitting: Physician Assistant

## 2017-08-15 ENCOUNTER — Encounter

## 2017-08-15 ENCOUNTER — Ambulatory Visit (INDEPENDENT_AMBULATORY_CARE_PROVIDER_SITE_OTHER): Payer: Medicare Other | Admitting: Physician Assistant

## 2017-08-15 VITALS — BP 112/60 | HR 59 | Ht 66.0 in | Wt 180.5 lb

## 2017-08-15 DIAGNOSIS — I25118 Atherosclerotic heart disease of native coronary artery with other forms of angina pectoris: Secondary | ICD-10-CM

## 2017-08-15 DIAGNOSIS — Z992 Dependence on renal dialysis: Secondary | ICD-10-CM | POA: Diagnosis not present

## 2017-08-15 DIAGNOSIS — Z8719 Personal history of other diseases of the digestive system: Secondary | ICD-10-CM

## 2017-08-15 DIAGNOSIS — I38 Endocarditis, valve unspecified: Secondary | ICD-10-CM

## 2017-08-15 DIAGNOSIS — I10 Essential (primary) hypertension: Secondary | ICD-10-CM

## 2017-08-15 DIAGNOSIS — I509 Heart failure, unspecified: Secondary | ICD-10-CM | POA: Diagnosis not present

## 2017-08-15 DIAGNOSIS — I739 Peripheral vascular disease, unspecified: Secondary | ICD-10-CM

## 2017-08-15 DIAGNOSIS — N186 End stage renal disease: Secondary | ICD-10-CM | POA: Diagnosis not present

## 2017-08-15 DIAGNOSIS — I779 Disorder of arteries and arterioles, unspecified: Secondary | ICD-10-CM

## 2017-08-15 DIAGNOSIS — E785 Hyperlipidemia, unspecified: Secondary | ICD-10-CM

## 2017-08-15 DIAGNOSIS — I129 Hypertensive chronic kidney disease with stage 1 through stage 4 chronic kidney disease, or unspecified chronic kidney disease: Secondary | ICD-10-CM | POA: Diagnosis not present

## 2017-08-15 NOTE — Patient Instructions (Addendum)
Medication Instructions:  Your physician recommends that you continue on your current medications as directed. Please refer to the Current Medication list given to you today.   Labwork: none  Testing/Procedures: none  Follow-Up: Your physician wants you to follow-up in: [place].  You will receive a reminder letter in the mail two months in advance. If you don't receive a letter, please call our office to schedule the follow-up appointment.  If you need a refill on your cardiac medications before your next appointment, please call your pharmacy.

## 2017-08-16 DIAGNOSIS — N186 End stage renal disease: Secondary | ICD-10-CM | POA: Diagnosis not present

## 2017-08-16 DIAGNOSIS — D689 Coagulation defect, unspecified: Secondary | ICD-10-CM | POA: Diagnosis not present

## 2017-08-16 DIAGNOSIS — N2581 Secondary hyperparathyroidism of renal origin: Secondary | ICD-10-CM | POA: Diagnosis not present

## 2017-08-16 DIAGNOSIS — D631 Anemia in chronic kidney disease: Secondary | ICD-10-CM | POA: Diagnosis not present

## 2017-08-19 DIAGNOSIS — D631 Anemia in chronic kidney disease: Secondary | ICD-10-CM | POA: Diagnosis not present

## 2017-08-19 DIAGNOSIS — N2581 Secondary hyperparathyroidism of renal origin: Secondary | ICD-10-CM | POA: Diagnosis not present

## 2017-08-19 DIAGNOSIS — N186 End stage renal disease: Secondary | ICD-10-CM | POA: Diagnosis not present

## 2017-08-19 DIAGNOSIS — D689 Coagulation defect, unspecified: Secondary | ICD-10-CM | POA: Diagnosis not present

## 2017-08-21 DIAGNOSIS — N186 End stage renal disease: Secondary | ICD-10-CM | POA: Diagnosis not present

## 2017-08-21 DIAGNOSIS — D631 Anemia in chronic kidney disease: Secondary | ICD-10-CM | POA: Diagnosis not present

## 2017-08-21 DIAGNOSIS — D689 Coagulation defect, unspecified: Secondary | ICD-10-CM | POA: Diagnosis not present

## 2017-08-21 DIAGNOSIS — N2581 Secondary hyperparathyroidism of renal origin: Secondary | ICD-10-CM | POA: Diagnosis not present

## 2017-08-23 DIAGNOSIS — N2581 Secondary hyperparathyroidism of renal origin: Secondary | ICD-10-CM | POA: Diagnosis not present

## 2017-08-23 DIAGNOSIS — N186 End stage renal disease: Secondary | ICD-10-CM | POA: Diagnosis not present

## 2017-08-23 DIAGNOSIS — D689 Coagulation defect, unspecified: Secondary | ICD-10-CM | POA: Diagnosis not present

## 2017-08-23 DIAGNOSIS — D631 Anemia in chronic kidney disease: Secondary | ICD-10-CM | POA: Diagnosis not present

## 2017-08-26 DIAGNOSIS — D689 Coagulation defect, unspecified: Secondary | ICD-10-CM | POA: Diagnosis not present

## 2017-08-26 DIAGNOSIS — N2581 Secondary hyperparathyroidism of renal origin: Secondary | ICD-10-CM | POA: Diagnosis not present

## 2017-08-26 DIAGNOSIS — D631 Anemia in chronic kidney disease: Secondary | ICD-10-CM | POA: Diagnosis not present

## 2017-08-26 DIAGNOSIS — N186 End stage renal disease: Secondary | ICD-10-CM | POA: Diagnosis not present

## 2017-08-28 DIAGNOSIS — N186 End stage renal disease: Secondary | ICD-10-CM | POA: Diagnosis not present

## 2017-08-28 DIAGNOSIS — D631 Anemia in chronic kidney disease: Secondary | ICD-10-CM | POA: Diagnosis not present

## 2017-08-28 DIAGNOSIS — N2581 Secondary hyperparathyroidism of renal origin: Secondary | ICD-10-CM | POA: Diagnosis not present

## 2017-08-28 DIAGNOSIS — D689 Coagulation defect, unspecified: Secondary | ICD-10-CM | POA: Diagnosis not present

## 2017-08-29 ENCOUNTER — Ambulatory Visit: Payer: Medicare Other | Admitting: Cardiovascular Disease

## 2017-08-30 DIAGNOSIS — D689 Coagulation defect, unspecified: Secondary | ICD-10-CM | POA: Diagnosis not present

## 2017-08-30 DIAGNOSIS — D631 Anemia in chronic kidney disease: Secondary | ICD-10-CM | POA: Diagnosis not present

## 2017-08-30 DIAGNOSIS — N2581 Secondary hyperparathyroidism of renal origin: Secondary | ICD-10-CM | POA: Diagnosis not present

## 2017-08-30 DIAGNOSIS — N186 End stage renal disease: Secondary | ICD-10-CM | POA: Diagnosis not present

## 2017-09-02 DIAGNOSIS — D689 Coagulation defect, unspecified: Secondary | ICD-10-CM | POA: Diagnosis not present

## 2017-09-02 DIAGNOSIS — N186 End stage renal disease: Secondary | ICD-10-CM | POA: Diagnosis not present

## 2017-09-02 DIAGNOSIS — N2581 Secondary hyperparathyroidism of renal origin: Secondary | ICD-10-CM | POA: Diagnosis not present

## 2017-09-02 DIAGNOSIS — D631 Anemia in chronic kidney disease: Secondary | ICD-10-CM | POA: Diagnosis not present

## 2017-09-03 ENCOUNTER — Ambulatory Visit (HOSPITAL_COMMUNITY)
Admission: RE | Admit: 2017-09-03 | Discharge: 2017-09-03 | Disposition: A | Discharge status: Home / Self Care | Payer: Medicare Other | Source: Ambulatory Visit | Attending: Family | Admitting: Family

## 2017-09-03 DIAGNOSIS — N186 End stage renal disease: Secondary | ICD-10-CM | POA: Insufficient documentation

## 2017-09-03 DIAGNOSIS — Z992 Dependence on renal dialysis: Secondary | ICD-10-CM

## 2017-09-04 ENCOUNTER — Ambulatory Visit: Payer: Medicare Other | Admitting: Physician Assistant

## 2017-09-04 DIAGNOSIS — D631 Anemia in chronic kidney disease: Secondary | ICD-10-CM | POA: Diagnosis not present

## 2017-09-04 DIAGNOSIS — N2581 Secondary hyperparathyroidism of renal origin: Secondary | ICD-10-CM | POA: Diagnosis not present

## 2017-09-04 DIAGNOSIS — N186 End stage renal disease: Secondary | ICD-10-CM | POA: Diagnosis not present

## 2017-09-04 DIAGNOSIS — D689 Coagulation defect, unspecified: Secondary | ICD-10-CM | POA: Diagnosis not present

## 2017-09-05 ENCOUNTER — Other Ambulatory Visit: Payer: Self-pay | Admitting: Physician Assistant

## 2017-09-05 ENCOUNTER — Other Ambulatory Visit: Payer: Self-pay | Admitting: Nurse Practitioner

## 2017-09-06 DIAGNOSIS — N2581 Secondary hyperparathyroidism of renal origin: Secondary | ICD-10-CM | POA: Diagnosis not present

## 2017-09-06 DIAGNOSIS — N186 End stage renal disease: Secondary | ICD-10-CM | POA: Diagnosis not present

## 2017-09-06 DIAGNOSIS — D631 Anemia in chronic kidney disease: Secondary | ICD-10-CM | POA: Diagnosis not present

## 2017-09-06 DIAGNOSIS — D689 Coagulation defect, unspecified: Secondary | ICD-10-CM | POA: Diagnosis not present

## 2017-09-08 NOTE — Telephone Encounter (Signed)
This is a [place] pt 

## 2017-09-09 DIAGNOSIS — D631 Anemia in chronic kidney disease: Secondary | ICD-10-CM | POA: Diagnosis not present

## 2017-09-09 DIAGNOSIS — N2581 Secondary hyperparathyroidism of renal origin: Secondary | ICD-10-CM | POA: Diagnosis not present

## 2017-09-09 DIAGNOSIS — D689 Coagulation defect, unspecified: Secondary | ICD-10-CM | POA: Diagnosis not present

## 2017-09-09 DIAGNOSIS — N186 End stage renal disease: Secondary | ICD-10-CM | POA: Diagnosis not present

## 2017-09-10 ENCOUNTER — Other Ambulatory Visit: Payer: Self-pay

## 2017-09-10 ENCOUNTER — Telehealth: Payer: Self-pay | Admitting: Physician Assistant

## 2017-09-10 ENCOUNTER — Encounter: Payer: Self-pay | Admitting: Vascular Surgery

## 2017-09-10 ENCOUNTER — Ambulatory Visit: Payer: Self-pay | Admitting: Vascular Surgery

## 2017-09-10 VITALS — BP 111/66 | HR 65 | Temp 98.2°F | Resp 20 | Ht 66.0 in | Wt 182.0 lb

## 2017-09-10 DIAGNOSIS — Z992 Dependence on renal dialysis: Secondary | ICD-10-CM

## 2017-09-10 DIAGNOSIS — N186 End stage renal disease: Secondary | ICD-10-CM

## 2017-09-10 NOTE — Telephone Encounter (Signed)
Patient daughter was unable to attend last ov with ryan 5/31 and patient is unable to give her clear information about what was done or changed.  Please call daughter to discuss visit details.

## 2017-09-10 NOTE — Progress Notes (Signed)
Patient name: Roger [name] MRN: [id number] DOB: [birth year] Sex: male  REASON FOR VISIT:   Follow-up after ligation of right brachiocephalic AV fistula  HPI:   Roger [name] is a pleasant 77 y.o. male who developed steal symptoms in his left upper extremity after a left brachiocephalic fistula.  This was banded however he continued to have symptoms so therefore this was ligated on 07/03/2017.  He comes in to discuss further access.  He was seen by a physician's assistant on 08/06/2017.  At that time he had some tingling and decreased sensation of the right hand but his pain at night had improved since ligation of his fistula.  He comes in to discuss new access in the left arm.  He denies any pain or paresthesias in the left arm.  He has a functioning right IJ tunneled dialysis catheter which has been functioning well.  He denies any recent uremic symptoms.   Current Outpatient Medications  Medication Sig Dispense Refill  . allopurinol (ZYLOPRIM) 100 MG tablet Take 1 tablet (100 mg total) by mouth daily. 30 tablet 0  . amLODipine (NORVASC) 10 MG tablet Take 1 tablet (10 mg total) by mouth daily. 90 tablet 3  . atorvastatin (LIPITOR) 80 MG tablet Take 1 tablet (80 mg total) by mouth daily at 6 PM. 90 tablet 3  . b complex vitamins tablet Take 1 tablet by mouth See admin instructions. Given at dialysis    . calcitRIOL (ROCALTROL) 0.25 MCG capsule Take 1 capsule (0.25 mcg total) by mouth Every Tuesday,Thursday,and Saturday with dialysis. 30 capsule 0  . carvedilol (COREG) 6.25 MG tablet Take 1 tablet (6.25 mg total) by mouth 2 (two) times daily with a meal. 180 tablet 3  . clopidogrel (PLAVIX) 75 MG tablet Take 1 tablet (75 mg total) by mouth daily. 90 tablet 3  . ferric citrate (AURYXIA) 1 GM 210 MG(Fe) tablet Take 420 mg by mouth 3 (three) times daily with meals.     . isosorbide mononitrate (IMDUR) 30 MG 24 hr tablet Take 30 mg by mouth daily.    Marland Kitchen oxyCODONE (ROXICODONE) 5 MG immediate  release tablet Take 1 tablet (5 mg total) by mouth every 4 (four) hours as needed for severe pain. 10 tablet 0  . ranitidine (ZANTAC) 150 MG tablet Take 150 mg by mouth daily as needed for heartburn.      No current facility-administered medications for this visit.     REVIEW OF SYSTEMS:  [X]  denotes positive finding, [ ]  denotes negative finding Cardiac  Comments:  Chest pain or chest pressure:    Shortness of breath upon exertion:    Short of breath when lying flat:    Irregular heart rhythm:    Constitutional    Fever or chills:     PHYSICAL EXAM:   Vitals:   09/10/17 1146  BP: 111/66  Pulse: 65  Resp: 20  Temp: 98.2 F (36.8 C)  TempSrc: Oral  SpO2: 98%  Weight: 182 lb (82.6 kg)  Height: 5\' 6"  (1.676 m)    GENERAL: The patient is a well-nourished male, in no acute distress. The vital signs are documented above. CARDIOVASCULAR: There is a regular rate and rhythm. PULMONARY: There is good air exchange bilaterally without wheezing or rales. The right hand is warm and well-perfused. He has a palpable brachial and radial pulse on the left.  DATA:   UPPER EXTREMITY VEIN MAP: I reviewed his upper extremity vein map from 10/20/2016.  At that time  the basilic vein on the left was thrombosed at the antecubital level.  The vein above that was patent and looked reasonable in size.  The forearm cephalic vein on the left did not appear to be adequate.  The upper arm cephalic vein looks marginal in size.  UPPER EXTREMITY ARTERIAL DUPLEX: I have reviewed the upper extremity arterial duplex from 09/03/2017.  This showed no evidence of significant upper extremity arterial occlusive disease on the left.  There are triphasic Doppler signals in the radial and ulnar positions.  There was no evidence of subclavian or axillary arterial stenosis.   MEDICAL ISSUES:   END-STAGE RENAL DISEASE: Based on his vein map, I think he has about a 50-50 chance of getting a fistula in the left arm.  If the  vein is not adequate then we would place an AV graft.  Certainly we would use a 4-7 mm PTFE graft in order to lower his risk of steal.  I explained that I think we could do this safely although there would be some risk of steal as before.  He is very reluctant to consider placing a new access at this time.  He would like to discuss this further with Dr. Joelyn Oms.  If he wishes to proceed with surgery then I will explore his veins at the time of surgery and plan on an AV fistula if at all possible otherwise we will place an AV graft.  We have discussed the indications for surgery and the potential complications including but not limited to bleeding, wound healing problems, arm swelling, graft thrombosis, failure of the fistula mature, or need for intervention on the fistula.  [name] [facility] [phone]

## 2017-09-10 NOTE — Telephone Encounter (Signed)
Spoke with patients daughter per release form and she wanted to review information discussed at last office visit. She was very appreciative for the call back and had no further questions or concerns at this time.

## 2017-09-11 DIAGNOSIS — D689 Coagulation defect, unspecified: Secondary | ICD-10-CM | POA: Diagnosis not present

## 2017-09-11 DIAGNOSIS — N2581 Secondary hyperparathyroidism of renal origin: Secondary | ICD-10-CM | POA: Diagnosis not present

## 2017-09-11 DIAGNOSIS — N186 End stage renal disease: Secondary | ICD-10-CM | POA: Diagnosis not present

## 2017-09-11 DIAGNOSIS — D631 Anemia in chronic kidney disease: Secondary | ICD-10-CM | POA: Diagnosis not present

## 2017-09-13 DIAGNOSIS — D631 Anemia in chronic kidney disease: Secondary | ICD-10-CM | POA: Diagnosis not present

## 2017-09-13 DIAGNOSIS — N186 End stage renal disease: Secondary | ICD-10-CM | POA: Diagnosis not present

## 2017-09-13 DIAGNOSIS — N2581 Secondary hyperparathyroidism of renal origin: Secondary | ICD-10-CM | POA: Diagnosis not present

## 2017-09-13 DIAGNOSIS — D689 Coagulation defect, unspecified: Secondary | ICD-10-CM | POA: Diagnosis not present

## 2017-09-14 DIAGNOSIS — Z992 Dependence on renal dialysis: Secondary | ICD-10-CM | POA: Diagnosis not present

## 2017-09-14 DIAGNOSIS — N186 End stage renal disease: Secondary | ICD-10-CM | POA: Diagnosis not present

## 2017-09-14 DIAGNOSIS — I129 Hypertensive chronic kidney disease with stage 1 through stage 4 chronic kidney disease, or unspecified chronic kidney disease: Secondary | ICD-10-CM | POA: Diagnosis not present

## 2017-09-16 DIAGNOSIS — N2581 Secondary hyperparathyroidism of renal origin: Secondary | ICD-10-CM | POA: Diagnosis not present

## 2017-09-16 DIAGNOSIS — R079 Chest pain, unspecified: Secondary | ICD-10-CM | POA: Diagnosis not present

## 2017-09-16 DIAGNOSIS — D631 Anemia in chronic kidney disease: Secondary | ICD-10-CM | POA: Diagnosis not present

## 2017-09-16 DIAGNOSIS — N186 End stage renal disease: Secondary | ICD-10-CM | POA: Diagnosis not present

## 2017-09-16 DIAGNOSIS — D689 Coagulation defect, unspecified: Secondary | ICD-10-CM | POA: Diagnosis not present

## 2017-09-18 DIAGNOSIS — N2581 Secondary hyperparathyroidism of renal origin: Secondary | ICD-10-CM | POA: Diagnosis not present

## 2017-09-18 DIAGNOSIS — R079 Chest pain, unspecified: Secondary | ICD-10-CM | POA: Diagnosis not present

## 2017-09-18 DIAGNOSIS — N186 End stage renal disease: Secondary | ICD-10-CM | POA: Diagnosis not present

## 2017-09-18 DIAGNOSIS — D689 Coagulation defect, unspecified: Secondary | ICD-10-CM | POA: Diagnosis not present

## 2017-09-18 DIAGNOSIS — D631 Anemia in chronic kidney disease: Secondary | ICD-10-CM | POA: Diagnosis not present

## 2017-09-20 DIAGNOSIS — D689 Coagulation defect, unspecified: Secondary | ICD-10-CM | POA: Diagnosis not present

## 2017-09-20 DIAGNOSIS — N2581 Secondary hyperparathyroidism of renal origin: Secondary | ICD-10-CM | POA: Diagnosis not present

## 2017-09-20 DIAGNOSIS — N186 End stage renal disease: Secondary | ICD-10-CM | POA: Diagnosis not present

## 2017-09-20 DIAGNOSIS — D631 Anemia in chronic kidney disease: Secondary | ICD-10-CM | POA: Diagnosis not present

## 2017-09-20 DIAGNOSIS — R079 Chest pain, unspecified: Secondary | ICD-10-CM | POA: Diagnosis not present

## 2017-09-23 DIAGNOSIS — R079 Chest pain, unspecified: Secondary | ICD-10-CM | POA: Diagnosis not present

## 2017-09-23 DIAGNOSIS — D689 Coagulation defect, unspecified: Secondary | ICD-10-CM | POA: Diagnosis not present

## 2017-09-23 DIAGNOSIS — N186 End stage renal disease: Secondary | ICD-10-CM | POA: Diagnosis not present

## 2017-09-23 DIAGNOSIS — N2581 Secondary hyperparathyroidism of renal origin: Secondary | ICD-10-CM | POA: Diagnosis not present

## 2017-09-23 DIAGNOSIS — D631 Anemia in chronic kidney disease: Secondary | ICD-10-CM | POA: Diagnosis not present

## 2017-09-25 DIAGNOSIS — D689 Coagulation defect, unspecified: Secondary | ICD-10-CM | POA: Diagnosis not present

## 2017-09-25 DIAGNOSIS — D631 Anemia in chronic kidney disease: Secondary | ICD-10-CM | POA: Diagnosis not present

## 2017-09-25 DIAGNOSIS — N186 End stage renal disease: Secondary | ICD-10-CM | POA: Diagnosis not present

## 2017-09-25 DIAGNOSIS — R079 Chest pain, unspecified: Secondary | ICD-10-CM | POA: Diagnosis not present

## 2017-09-25 DIAGNOSIS — N2581 Secondary hyperparathyroidism of renal origin: Secondary | ICD-10-CM | POA: Diagnosis not present

## 2017-09-27 DIAGNOSIS — N186 End stage renal disease: Secondary | ICD-10-CM | POA: Diagnosis not present

## 2017-09-27 DIAGNOSIS — D631 Anemia in chronic kidney disease: Secondary | ICD-10-CM | POA: Diagnosis not present

## 2017-09-27 DIAGNOSIS — R079 Chest pain, unspecified: Secondary | ICD-10-CM | POA: Diagnosis not present

## 2017-09-27 DIAGNOSIS — N2581 Secondary hyperparathyroidism of renal origin: Secondary | ICD-10-CM | POA: Diagnosis not present

## 2017-09-27 DIAGNOSIS — D689 Coagulation defect, unspecified: Secondary | ICD-10-CM | POA: Diagnosis not present

## 2017-09-30 DIAGNOSIS — N2581 Secondary hyperparathyroidism of renal origin: Secondary | ICD-10-CM | POA: Diagnosis not present

## 2017-09-30 DIAGNOSIS — D631 Anemia in chronic kidney disease: Secondary | ICD-10-CM | POA: Diagnosis not present

## 2017-09-30 DIAGNOSIS — N186 End stage renal disease: Secondary | ICD-10-CM | POA: Diagnosis not present

## 2017-09-30 DIAGNOSIS — D689 Coagulation defect, unspecified: Secondary | ICD-10-CM | POA: Diagnosis not present

## 2017-09-30 DIAGNOSIS — R079 Chest pain, unspecified: Secondary | ICD-10-CM | POA: Diagnosis not present

## 2017-10-02 DIAGNOSIS — N186 End stage renal disease: Secondary | ICD-10-CM | POA: Diagnosis not present

## 2017-10-02 DIAGNOSIS — D631 Anemia in chronic kidney disease: Secondary | ICD-10-CM | POA: Diagnosis not present

## 2017-10-02 DIAGNOSIS — N2581 Secondary hyperparathyroidism of renal origin: Secondary | ICD-10-CM | POA: Diagnosis not present

## 2017-10-02 DIAGNOSIS — R079 Chest pain, unspecified: Secondary | ICD-10-CM | POA: Diagnosis not present

## 2017-10-02 DIAGNOSIS — D689 Coagulation defect, unspecified: Secondary | ICD-10-CM | POA: Diagnosis not present

## 2017-10-04 DIAGNOSIS — D631 Anemia in chronic kidney disease: Secondary | ICD-10-CM | POA: Diagnosis not present

## 2017-10-04 DIAGNOSIS — R079 Chest pain, unspecified: Secondary | ICD-10-CM | POA: Diagnosis not present

## 2017-10-04 DIAGNOSIS — N186 End stage renal disease: Secondary | ICD-10-CM | POA: Diagnosis not present

## 2017-10-04 DIAGNOSIS — D689 Coagulation defect, unspecified: Secondary | ICD-10-CM | POA: Diagnosis not present

## 2017-10-04 DIAGNOSIS — N2581 Secondary hyperparathyroidism of renal origin: Secondary | ICD-10-CM | POA: Diagnosis not present

## 2017-10-07 DIAGNOSIS — D689 Coagulation defect, unspecified: Secondary | ICD-10-CM | POA: Diagnosis not present

## 2017-10-07 DIAGNOSIS — N2581 Secondary hyperparathyroidism of renal origin: Secondary | ICD-10-CM | POA: Diagnosis not present

## 2017-10-07 DIAGNOSIS — R079 Chest pain, unspecified: Secondary | ICD-10-CM | POA: Diagnosis not present

## 2017-10-07 DIAGNOSIS — N186 End stage renal disease: Secondary | ICD-10-CM | POA: Diagnosis not present

## 2017-10-07 DIAGNOSIS — D631 Anemia in chronic kidney disease: Secondary | ICD-10-CM | POA: Diagnosis not present

## 2017-10-09 DIAGNOSIS — R079 Chest pain, unspecified: Secondary | ICD-10-CM | POA: Diagnosis not present

## 2017-10-09 DIAGNOSIS — D689 Coagulation defect, unspecified: Secondary | ICD-10-CM | POA: Diagnosis not present

## 2017-10-09 DIAGNOSIS — N186 End stage renal disease: Secondary | ICD-10-CM | POA: Diagnosis not present

## 2017-10-09 DIAGNOSIS — D631 Anemia in chronic kidney disease: Secondary | ICD-10-CM | POA: Diagnosis not present

## 2017-10-09 DIAGNOSIS — N2581 Secondary hyperparathyroidism of renal origin: Secondary | ICD-10-CM | POA: Diagnosis not present

## 2017-10-11 DIAGNOSIS — N2581 Secondary hyperparathyroidism of renal origin: Secondary | ICD-10-CM | POA: Diagnosis not present

## 2017-10-11 DIAGNOSIS — D689 Coagulation defect, unspecified: Secondary | ICD-10-CM | POA: Diagnosis not present

## 2017-10-11 DIAGNOSIS — R079 Chest pain, unspecified: Secondary | ICD-10-CM | POA: Diagnosis not present

## 2017-10-11 DIAGNOSIS — N186 End stage renal disease: Secondary | ICD-10-CM | POA: Diagnosis not present

## 2017-10-11 DIAGNOSIS — D631 Anemia in chronic kidney disease: Secondary | ICD-10-CM | POA: Diagnosis not present

## 2017-10-13 DIAGNOSIS — Z0001 Encounter for general adult medical examination with abnormal findings: Secondary | ICD-10-CM | POA: Diagnosis not present

## 2017-10-13 DIAGNOSIS — I25118 Atherosclerotic heart disease of native coronary artery with other forms of angina pectoris: Secondary | ICD-10-CM | POA: Diagnosis not present

## 2017-10-13 DIAGNOSIS — E7800 Pure hypercholesterolemia, unspecified: Secondary | ICD-10-CM | POA: Diagnosis not present

## 2017-10-13 DIAGNOSIS — R413 Other amnesia: Secondary | ICD-10-CM | POA: Diagnosis not present

## 2017-10-14 DIAGNOSIS — N2581 Secondary hyperparathyroidism of renal origin: Secondary | ICD-10-CM | POA: Diagnosis not present

## 2017-10-14 DIAGNOSIS — D631 Anemia in chronic kidney disease: Secondary | ICD-10-CM | POA: Diagnosis not present

## 2017-10-14 DIAGNOSIS — R079 Chest pain, unspecified: Secondary | ICD-10-CM | POA: Diagnosis not present

## 2017-10-14 DIAGNOSIS — N186 End stage renal disease: Secondary | ICD-10-CM | POA: Diagnosis not present

## 2017-10-14 DIAGNOSIS — D689 Coagulation defect, unspecified: Secondary | ICD-10-CM | POA: Diagnosis not present

## 2017-10-15 DIAGNOSIS — N186 End stage renal disease: Secondary | ICD-10-CM | POA: Diagnosis not present

## 2017-10-15 DIAGNOSIS — I129 Hypertensive chronic kidney disease with stage 1 through stage 4 chronic kidney disease, or unspecified chronic kidney disease: Secondary | ICD-10-CM | POA: Diagnosis not present

## 2017-10-15 DIAGNOSIS — Z992 Dependence on renal dialysis: Secondary | ICD-10-CM | POA: Diagnosis not present

## 2017-10-16 DIAGNOSIS — N2581 Secondary hyperparathyroidism of renal origin: Secondary | ICD-10-CM | POA: Diagnosis not present

## 2017-10-16 DIAGNOSIS — D689 Coagulation defect, unspecified: Secondary | ICD-10-CM | POA: Diagnosis not present

## 2017-10-16 DIAGNOSIS — N186 End stage renal disease: Secondary | ICD-10-CM | POA: Diagnosis not present

## 2017-10-16 DIAGNOSIS — D631 Anemia in chronic kidney disease: Secondary | ICD-10-CM | POA: Diagnosis not present

## 2017-10-18 DIAGNOSIS — N186 End stage renal disease: Secondary | ICD-10-CM | POA: Diagnosis not present

## 2017-10-18 DIAGNOSIS — N2581 Secondary hyperparathyroidism of renal origin: Secondary | ICD-10-CM | POA: Diagnosis not present

## 2017-10-18 DIAGNOSIS — D631 Anemia in chronic kidney disease: Secondary | ICD-10-CM | POA: Diagnosis not present

## 2017-10-18 DIAGNOSIS — D689 Coagulation defect, unspecified: Secondary | ICD-10-CM | POA: Diagnosis not present

## 2017-10-21 DIAGNOSIS — D631 Anemia in chronic kidney disease: Secondary | ICD-10-CM | POA: Diagnosis not present

## 2017-10-21 DIAGNOSIS — N186 End stage renal disease: Secondary | ICD-10-CM | POA: Diagnosis not present

## 2017-10-21 DIAGNOSIS — N2581 Secondary hyperparathyroidism of renal origin: Secondary | ICD-10-CM | POA: Diagnosis not present

## 2017-10-21 DIAGNOSIS — D689 Coagulation defect, unspecified: Secondary | ICD-10-CM | POA: Diagnosis not present

## 2017-10-23 DIAGNOSIS — N2581 Secondary hyperparathyroidism of renal origin: Secondary | ICD-10-CM | POA: Diagnosis not present

## 2017-10-23 DIAGNOSIS — D631 Anemia in chronic kidney disease: Secondary | ICD-10-CM | POA: Diagnosis not present

## 2017-10-23 DIAGNOSIS — D689 Coagulation defect, unspecified: Secondary | ICD-10-CM | POA: Diagnosis not present

## 2017-10-23 DIAGNOSIS — N186 End stage renal disease: Secondary | ICD-10-CM | POA: Diagnosis not present

## 2017-10-25 DIAGNOSIS — D689 Coagulation defect, unspecified: Secondary | ICD-10-CM | POA: Diagnosis not present

## 2017-10-25 DIAGNOSIS — N2581 Secondary hyperparathyroidism of renal origin: Secondary | ICD-10-CM | POA: Diagnosis not present

## 2017-10-25 DIAGNOSIS — D631 Anemia in chronic kidney disease: Secondary | ICD-10-CM | POA: Diagnosis not present

## 2017-10-25 DIAGNOSIS — N186 End stage renal disease: Secondary | ICD-10-CM | POA: Diagnosis not present

## 2017-10-28 DIAGNOSIS — D689 Coagulation defect, unspecified: Secondary | ICD-10-CM | POA: Diagnosis not present

## 2017-10-28 DIAGNOSIS — N186 End stage renal disease: Secondary | ICD-10-CM | POA: Diagnosis not present

## 2017-10-28 DIAGNOSIS — D631 Anemia in chronic kidney disease: Secondary | ICD-10-CM | POA: Diagnosis not present

## 2017-10-28 DIAGNOSIS — N2581 Secondary hyperparathyroidism of renal origin: Secondary | ICD-10-CM | POA: Diagnosis not present

## 2017-10-30 DIAGNOSIS — N2581 Secondary hyperparathyroidism of renal origin: Secondary | ICD-10-CM | POA: Diagnosis not present

## 2017-10-30 DIAGNOSIS — D631 Anemia in chronic kidney disease: Secondary | ICD-10-CM | POA: Diagnosis not present

## 2017-10-30 DIAGNOSIS — D689 Coagulation defect, unspecified: Secondary | ICD-10-CM | POA: Diagnosis not present

## 2017-10-30 DIAGNOSIS — N186 End stage renal disease: Secondary | ICD-10-CM | POA: Diagnosis not present

## 2017-10-31 ENCOUNTER — Other Ambulatory Visit: Payer: Self-pay | Admitting: *Deleted

## 2017-10-31 MED ORDER — ATORVASTATIN CALCIUM 80 MG PO TABS: 80.0000 mg | ORAL_TABLET | Freq: Every day | ORAL | 2 refills | Status: DC

## 2017-11-01 DIAGNOSIS — D631 Anemia in chronic kidney disease: Secondary | ICD-10-CM | POA: Diagnosis not present

## 2017-11-01 DIAGNOSIS — D689 Coagulation defect, unspecified: Secondary | ICD-10-CM | POA: Diagnosis not present

## 2017-11-01 DIAGNOSIS — N186 End stage renal disease: Secondary | ICD-10-CM | POA: Diagnosis not present

## 2017-11-01 DIAGNOSIS — N2581 Secondary hyperparathyroidism of renal origin: Secondary | ICD-10-CM | POA: Diagnosis not present

## 2017-11-04 DIAGNOSIS — D631 Anemia in chronic kidney disease: Secondary | ICD-10-CM | POA: Diagnosis not present

## 2017-11-04 DIAGNOSIS — D689 Coagulation defect, unspecified: Secondary | ICD-10-CM | POA: Diagnosis not present

## 2017-11-04 DIAGNOSIS — N186 End stage renal disease: Secondary | ICD-10-CM | POA: Diagnosis not present

## 2017-11-04 DIAGNOSIS — N2581 Secondary hyperparathyroidism of renal origin: Secondary | ICD-10-CM | POA: Diagnosis not present

## 2017-11-06 DIAGNOSIS — N2581 Secondary hyperparathyroidism of renal origin: Secondary | ICD-10-CM | POA: Diagnosis not present

## 2017-11-06 DIAGNOSIS — D689 Coagulation defect, unspecified: Secondary | ICD-10-CM | POA: Diagnosis not present

## 2017-11-06 DIAGNOSIS — D631 Anemia in chronic kidney disease: Secondary | ICD-10-CM | POA: Diagnosis not present

## 2017-11-06 DIAGNOSIS — N186 End stage renal disease: Secondary | ICD-10-CM | POA: Diagnosis not present

## 2017-11-08 DIAGNOSIS — D631 Anemia in chronic kidney disease: Secondary | ICD-10-CM | POA: Diagnosis not present

## 2017-11-08 DIAGNOSIS — D689 Coagulation defect, unspecified: Secondary | ICD-10-CM | POA: Diagnosis not present

## 2017-11-08 DIAGNOSIS — N186 End stage renal disease: Secondary | ICD-10-CM | POA: Diagnosis not present

## 2017-11-08 DIAGNOSIS — N2581 Secondary hyperparathyroidism of renal origin: Secondary | ICD-10-CM | POA: Diagnosis not present

## 2017-11-11 DIAGNOSIS — N2581 Secondary hyperparathyroidism of renal origin: Secondary | ICD-10-CM | POA: Diagnosis not present

## 2017-11-11 DIAGNOSIS — D689 Coagulation defect, unspecified: Secondary | ICD-10-CM | POA: Diagnosis not present

## 2017-11-11 DIAGNOSIS — D631 Anemia in chronic kidney disease: Secondary | ICD-10-CM | POA: Diagnosis not present

## 2017-11-11 DIAGNOSIS — N186 End stage renal disease: Secondary | ICD-10-CM | POA: Diagnosis not present

## 2017-11-13 DIAGNOSIS — N2581 Secondary hyperparathyroidism of renal origin: Secondary | ICD-10-CM | POA: Diagnosis not present

## 2017-11-13 DIAGNOSIS — N186 End stage renal disease: Secondary | ICD-10-CM | POA: Diagnosis not present

## 2017-11-13 DIAGNOSIS — D689 Coagulation defect, unspecified: Secondary | ICD-10-CM | POA: Diagnosis not present

## 2017-11-13 DIAGNOSIS — D631 Anemia in chronic kidney disease: Secondary | ICD-10-CM | POA: Diagnosis not present

## 2017-11-15 DIAGNOSIS — Z992 Dependence on renal dialysis: Secondary | ICD-10-CM | POA: Diagnosis not present

## 2017-11-15 DIAGNOSIS — D631 Anemia in chronic kidney disease: Secondary | ICD-10-CM | POA: Diagnosis not present

## 2017-11-15 DIAGNOSIS — I129 Hypertensive chronic kidney disease with stage 1 through stage 4 chronic kidney disease, or unspecified chronic kidney disease: Secondary | ICD-10-CM | POA: Diagnosis not present

## 2017-11-15 DIAGNOSIS — N186 End stage renal disease: Secondary | ICD-10-CM | POA: Diagnosis not present

## 2017-11-15 DIAGNOSIS — D689 Coagulation defect, unspecified: Secondary | ICD-10-CM | POA: Diagnosis not present

## 2017-11-15 DIAGNOSIS — N2581 Secondary hyperparathyroidism of renal origin: Secondary | ICD-10-CM | POA: Diagnosis not present

## 2017-11-18 DIAGNOSIS — D689 Coagulation defect, unspecified: Secondary | ICD-10-CM | POA: Diagnosis not present

## 2017-11-18 DIAGNOSIS — N2581 Secondary hyperparathyroidism of renal origin: Secondary | ICD-10-CM | POA: Diagnosis not present

## 2017-11-18 DIAGNOSIS — D631 Anemia in chronic kidney disease: Secondary | ICD-10-CM | POA: Diagnosis not present

## 2017-11-18 DIAGNOSIS — N186 End stage renal disease: Secondary | ICD-10-CM | POA: Diagnosis not present

## 2017-11-20 DIAGNOSIS — N186 End stage renal disease: Secondary | ICD-10-CM | POA: Diagnosis not present

## 2017-11-20 DIAGNOSIS — N2581 Secondary hyperparathyroidism of renal origin: Secondary | ICD-10-CM | POA: Diagnosis not present

## 2017-11-20 DIAGNOSIS — D689 Coagulation defect, unspecified: Secondary | ICD-10-CM | POA: Diagnosis not present

## 2017-11-20 DIAGNOSIS — D631 Anemia in chronic kidney disease: Secondary | ICD-10-CM | POA: Diagnosis not present

## 2017-11-22 DIAGNOSIS — N2581 Secondary hyperparathyroidism of renal origin: Secondary | ICD-10-CM | POA: Diagnosis not present

## 2017-11-22 DIAGNOSIS — D689 Coagulation defect, unspecified: Secondary | ICD-10-CM | POA: Diagnosis not present

## 2017-11-22 DIAGNOSIS — N186 End stage renal disease: Secondary | ICD-10-CM | POA: Diagnosis not present

## 2017-11-22 DIAGNOSIS — D631 Anemia in chronic kidney disease: Secondary | ICD-10-CM | POA: Diagnosis not present

## 2017-11-25 DIAGNOSIS — N186 End stage renal disease: Secondary | ICD-10-CM | POA: Diagnosis not present

## 2017-11-25 DIAGNOSIS — D631 Anemia in chronic kidney disease: Secondary | ICD-10-CM | POA: Diagnosis not present

## 2017-11-25 DIAGNOSIS — N2581 Secondary hyperparathyroidism of renal origin: Secondary | ICD-10-CM | POA: Diagnosis not present

## 2017-11-25 DIAGNOSIS — D689 Coagulation defect, unspecified: Secondary | ICD-10-CM | POA: Diagnosis not present

## 2017-11-27 DIAGNOSIS — D689 Coagulation defect, unspecified: Secondary | ICD-10-CM | POA: Diagnosis not present

## 2017-11-27 DIAGNOSIS — D631 Anemia in chronic kidney disease: Secondary | ICD-10-CM | POA: Diagnosis not present

## 2017-11-27 DIAGNOSIS — N186 End stage renal disease: Secondary | ICD-10-CM | POA: Diagnosis not present

## 2017-11-27 DIAGNOSIS — N2581 Secondary hyperparathyroidism of renal origin: Secondary | ICD-10-CM | POA: Diagnosis not present

## 2017-11-29 DIAGNOSIS — D631 Anemia in chronic kidney disease: Secondary | ICD-10-CM | POA: Diagnosis not present

## 2017-11-29 DIAGNOSIS — N2581 Secondary hyperparathyroidism of renal origin: Secondary | ICD-10-CM | POA: Diagnosis not present

## 2017-11-29 DIAGNOSIS — D689 Coagulation defect, unspecified: Secondary | ICD-10-CM | POA: Diagnosis not present

## 2017-11-29 DIAGNOSIS — N186 End stage renal disease: Secondary | ICD-10-CM | POA: Diagnosis not present

## 2017-12-02 DIAGNOSIS — D631 Anemia in chronic kidney disease: Secondary | ICD-10-CM | POA: Diagnosis not present

## 2017-12-02 DIAGNOSIS — N2581 Secondary hyperparathyroidism of renal origin: Secondary | ICD-10-CM | POA: Diagnosis not present

## 2017-12-02 DIAGNOSIS — N186 End stage renal disease: Secondary | ICD-10-CM | POA: Diagnosis not present

## 2017-12-02 DIAGNOSIS — D689 Coagulation defect, unspecified: Secondary | ICD-10-CM | POA: Diagnosis not present

## 2017-12-04 ENCOUNTER — Other Ambulatory Visit: Payer: Self-pay | Admitting: *Deleted

## 2017-12-04 ENCOUNTER — Other Ambulatory Visit: Payer: Self-pay

## 2017-12-04 DIAGNOSIS — N2581 Secondary hyperparathyroidism of renal origin: Secondary | ICD-10-CM | POA: Diagnosis not present

## 2017-12-04 DIAGNOSIS — D631 Anemia in chronic kidney disease: Secondary | ICD-10-CM | POA: Diagnosis not present

## 2017-12-04 DIAGNOSIS — N186 End stage renal disease: Secondary | ICD-10-CM | POA: Diagnosis not present

## 2017-12-04 DIAGNOSIS — D689 Coagulation defect, unspecified: Secondary | ICD-10-CM | POA: Diagnosis not present

## 2017-12-04 MED ORDER — AMLODIPINE BESYLATE 10 MG PO TABS: 10.0000 mg | ORAL_TABLET | Freq: Every day | ORAL | 0 refills | Status: DC

## 2017-12-04 MED ORDER — CLOPIDOGREL BISULFATE 75 MG PO TABS: 75.0000 mg | ORAL_TABLET | Freq: Every day | ORAL | 0 refills | Status: DC

## 2017-12-04 NOTE — Telephone Encounter (Signed)
Requested Prescriptions   Signed Prescriptions Disp Refills  . amLODipine (NORVASC) 10 MG tablet 90 tablet 0    Sig: Take 1 tablet (10 mg total) by mouth daily.    Authorizing Provider: Theora Gianotti    Ordering User: Janan Ridge

## 2017-12-06 DIAGNOSIS — N186 End stage renal disease: Secondary | ICD-10-CM | POA: Diagnosis not present

## 2017-12-06 DIAGNOSIS — D631 Anemia in chronic kidney disease: Secondary | ICD-10-CM | POA: Diagnosis not present

## 2017-12-06 DIAGNOSIS — D689 Coagulation defect, unspecified: Secondary | ICD-10-CM | POA: Diagnosis not present

## 2017-12-06 DIAGNOSIS — N2581 Secondary hyperparathyroidism of renal origin: Secondary | ICD-10-CM | POA: Diagnosis not present

## 2017-12-09 DIAGNOSIS — D689 Coagulation defect, unspecified: Secondary | ICD-10-CM | POA: Diagnosis not present

## 2017-12-09 DIAGNOSIS — N186 End stage renal disease: Secondary | ICD-10-CM | POA: Diagnosis not present

## 2017-12-09 DIAGNOSIS — D631 Anemia in chronic kidney disease: Secondary | ICD-10-CM | POA: Diagnosis not present

## 2017-12-09 DIAGNOSIS — N2581 Secondary hyperparathyroidism of renal origin: Secondary | ICD-10-CM | POA: Diagnosis not present

## 2017-12-11 DIAGNOSIS — N2581 Secondary hyperparathyroidism of renal origin: Secondary | ICD-10-CM | POA: Diagnosis not present

## 2017-12-11 DIAGNOSIS — D689 Coagulation defect, unspecified: Secondary | ICD-10-CM | POA: Diagnosis not present

## 2017-12-11 DIAGNOSIS — D631 Anemia in chronic kidney disease: Secondary | ICD-10-CM | POA: Diagnosis not present

## 2017-12-11 DIAGNOSIS — N186 End stage renal disease: Secondary | ICD-10-CM | POA: Diagnosis not present

## 2017-12-13 DIAGNOSIS — N2581 Secondary hyperparathyroidism of renal origin: Secondary | ICD-10-CM | POA: Diagnosis not present

## 2017-12-13 DIAGNOSIS — D689 Coagulation defect, unspecified: Secondary | ICD-10-CM | POA: Diagnosis not present

## 2017-12-13 DIAGNOSIS — D631 Anemia in chronic kidney disease: Secondary | ICD-10-CM | POA: Diagnosis not present

## 2017-12-13 DIAGNOSIS — N186 End stage renal disease: Secondary | ICD-10-CM | POA: Diagnosis not present

## 2017-12-15 DIAGNOSIS — Z992 Dependence on renal dialysis: Secondary | ICD-10-CM | POA: Diagnosis not present

## 2017-12-15 DIAGNOSIS — I129 Hypertensive chronic kidney disease with stage 1 through stage 4 chronic kidney disease, or unspecified chronic kidney disease: Secondary | ICD-10-CM | POA: Diagnosis not present

## 2017-12-15 DIAGNOSIS — N186 End stage renal disease: Secondary | ICD-10-CM | POA: Diagnosis not present

## 2017-12-16 DIAGNOSIS — D631 Anemia in chronic kidney disease: Secondary | ICD-10-CM | POA: Diagnosis not present

## 2017-12-16 DIAGNOSIS — D509 Iron deficiency anemia, unspecified: Secondary | ICD-10-CM | POA: Diagnosis not present

## 2017-12-16 DIAGNOSIS — D689 Coagulation defect, unspecified: Secondary | ICD-10-CM | POA: Diagnosis not present

## 2017-12-16 DIAGNOSIS — Z23 Encounter for immunization: Secondary | ICD-10-CM | POA: Diagnosis not present

## 2017-12-16 DIAGNOSIS — N186 End stage renal disease: Secondary | ICD-10-CM | POA: Diagnosis not present

## 2017-12-16 DIAGNOSIS — N2581 Secondary hyperparathyroidism of renal origin: Secondary | ICD-10-CM | POA: Diagnosis not present

## 2017-12-18 DIAGNOSIS — Z23 Encounter for immunization: Secondary | ICD-10-CM | POA: Diagnosis not present

## 2017-12-18 DIAGNOSIS — N2581 Secondary hyperparathyroidism of renal origin: Secondary | ICD-10-CM | POA: Diagnosis not present

## 2017-12-18 DIAGNOSIS — D509 Iron deficiency anemia, unspecified: Secondary | ICD-10-CM | POA: Diagnosis not present

## 2017-12-18 DIAGNOSIS — N186 End stage renal disease: Secondary | ICD-10-CM | POA: Diagnosis not present

## 2017-12-18 DIAGNOSIS — D689 Coagulation defect, unspecified: Secondary | ICD-10-CM | POA: Diagnosis not present

## 2017-12-18 DIAGNOSIS — D631 Anemia in chronic kidney disease: Secondary | ICD-10-CM | POA: Diagnosis not present

## 2017-12-20 DIAGNOSIS — N2581 Secondary hyperparathyroidism of renal origin: Secondary | ICD-10-CM | POA: Diagnosis not present

## 2017-12-20 DIAGNOSIS — D689 Coagulation defect, unspecified: Secondary | ICD-10-CM | POA: Diagnosis not present

## 2017-12-20 DIAGNOSIS — D509 Iron deficiency anemia, unspecified: Secondary | ICD-10-CM | POA: Diagnosis not present

## 2017-12-20 DIAGNOSIS — N186 End stage renal disease: Secondary | ICD-10-CM | POA: Diagnosis not present

## 2017-12-20 DIAGNOSIS — D631 Anemia in chronic kidney disease: Secondary | ICD-10-CM | POA: Diagnosis not present

## 2017-12-20 DIAGNOSIS — Z23 Encounter for immunization: Secondary | ICD-10-CM | POA: Diagnosis not present

## 2017-12-22 DIAGNOSIS — K219 Gastro-esophageal reflux disease without esophagitis: Secondary | ICD-10-CM | POA: Diagnosis not present

## 2017-12-22 DIAGNOSIS — N3000 Acute cystitis without hematuria: Secondary | ICD-10-CM | POA: Diagnosis not present

## 2017-12-23 DIAGNOSIS — D631 Anemia in chronic kidney disease: Secondary | ICD-10-CM | POA: Diagnosis not present

## 2017-12-23 DIAGNOSIS — N2581 Secondary hyperparathyroidism of renal origin: Secondary | ICD-10-CM | POA: Diagnosis not present

## 2017-12-23 DIAGNOSIS — D509 Iron deficiency anemia, unspecified: Secondary | ICD-10-CM | POA: Diagnosis not present

## 2017-12-23 DIAGNOSIS — Z23 Encounter for immunization: Secondary | ICD-10-CM | POA: Diagnosis not present

## 2017-12-23 DIAGNOSIS — D689 Coagulation defect, unspecified: Secondary | ICD-10-CM | POA: Diagnosis not present

## 2017-12-23 DIAGNOSIS — N186 End stage renal disease: Secondary | ICD-10-CM | POA: Diagnosis not present

## 2017-12-25 DIAGNOSIS — D689 Coagulation defect, unspecified: Secondary | ICD-10-CM | POA: Diagnosis not present

## 2017-12-25 DIAGNOSIS — D509 Iron deficiency anemia, unspecified: Secondary | ICD-10-CM | POA: Diagnosis not present

## 2017-12-25 DIAGNOSIS — N2581 Secondary hyperparathyroidism of renal origin: Secondary | ICD-10-CM | POA: Diagnosis not present

## 2017-12-25 DIAGNOSIS — D631 Anemia in chronic kidney disease: Secondary | ICD-10-CM | POA: Diagnosis not present

## 2017-12-25 DIAGNOSIS — Z23 Encounter for immunization: Secondary | ICD-10-CM | POA: Diagnosis not present

## 2017-12-25 DIAGNOSIS — N186 End stage renal disease: Secondary | ICD-10-CM | POA: Diagnosis not present

## 2017-12-27 DIAGNOSIS — D689 Coagulation defect, unspecified: Secondary | ICD-10-CM | POA: Diagnosis not present

## 2017-12-27 DIAGNOSIS — Z23 Encounter for immunization: Secondary | ICD-10-CM | POA: Diagnosis not present

## 2017-12-27 DIAGNOSIS — D631 Anemia in chronic kidney disease: Secondary | ICD-10-CM | POA: Diagnosis not present

## 2017-12-27 DIAGNOSIS — N186 End stage renal disease: Secondary | ICD-10-CM | POA: Diagnosis not present

## 2017-12-27 DIAGNOSIS — N2581 Secondary hyperparathyroidism of renal origin: Secondary | ICD-10-CM | POA: Diagnosis not present

## 2017-12-27 DIAGNOSIS — D509 Iron deficiency anemia, unspecified: Secondary | ICD-10-CM | POA: Diagnosis not present

## 2017-12-30 DIAGNOSIS — N2581 Secondary hyperparathyroidism of renal origin: Secondary | ICD-10-CM | POA: Diagnosis not present

## 2017-12-30 DIAGNOSIS — Z23 Encounter for immunization: Secondary | ICD-10-CM | POA: Diagnosis not present

## 2017-12-30 DIAGNOSIS — N186 End stage renal disease: Secondary | ICD-10-CM | POA: Diagnosis not present

## 2017-12-30 DIAGNOSIS — D631 Anemia in chronic kidney disease: Secondary | ICD-10-CM | POA: Diagnosis not present

## 2017-12-30 DIAGNOSIS — D509 Iron deficiency anemia, unspecified: Secondary | ICD-10-CM | POA: Diagnosis not present

## 2017-12-30 DIAGNOSIS — D689 Coagulation defect, unspecified: Secondary | ICD-10-CM | POA: Diagnosis not present

## 2018-01-01 DIAGNOSIS — Z23 Encounter for immunization: Secondary | ICD-10-CM | POA: Diagnosis not present

## 2018-01-01 DIAGNOSIS — D509 Iron deficiency anemia, unspecified: Secondary | ICD-10-CM | POA: Diagnosis not present

## 2018-01-01 DIAGNOSIS — N186 End stage renal disease: Secondary | ICD-10-CM | POA: Diagnosis not present

## 2018-01-01 DIAGNOSIS — D631 Anemia in chronic kidney disease: Secondary | ICD-10-CM | POA: Diagnosis not present

## 2018-01-01 DIAGNOSIS — N2581 Secondary hyperparathyroidism of renal origin: Secondary | ICD-10-CM | POA: Diagnosis not present

## 2018-01-01 DIAGNOSIS — D689 Coagulation defect, unspecified: Secondary | ICD-10-CM | POA: Diagnosis not present

## 2018-01-03 DIAGNOSIS — D509 Iron deficiency anemia, unspecified: Secondary | ICD-10-CM | POA: Diagnosis not present

## 2018-01-03 DIAGNOSIS — D689 Coagulation defect, unspecified: Secondary | ICD-10-CM | POA: Diagnosis not present

## 2018-01-03 DIAGNOSIS — N186 End stage renal disease: Secondary | ICD-10-CM | POA: Diagnosis not present

## 2018-01-03 DIAGNOSIS — D631 Anemia in chronic kidney disease: Secondary | ICD-10-CM | POA: Diagnosis not present

## 2018-01-03 DIAGNOSIS — N2581 Secondary hyperparathyroidism of renal origin: Secondary | ICD-10-CM | POA: Diagnosis not present

## 2018-01-03 DIAGNOSIS — Z23 Encounter for immunization: Secondary | ICD-10-CM | POA: Diagnosis not present

## 2018-01-06 DIAGNOSIS — D689 Coagulation defect, unspecified: Secondary | ICD-10-CM | POA: Diagnosis not present

## 2018-01-06 DIAGNOSIS — N2581 Secondary hyperparathyroidism of renal origin: Secondary | ICD-10-CM | POA: Diagnosis not present

## 2018-01-06 DIAGNOSIS — D509 Iron deficiency anemia, unspecified: Secondary | ICD-10-CM | POA: Diagnosis not present

## 2018-01-06 DIAGNOSIS — Z23 Encounter for immunization: Secondary | ICD-10-CM | POA: Diagnosis not present

## 2018-01-06 DIAGNOSIS — D631 Anemia in chronic kidney disease: Secondary | ICD-10-CM | POA: Diagnosis not present

## 2018-01-06 DIAGNOSIS — N186 End stage renal disease: Secondary | ICD-10-CM | POA: Diagnosis not present

## 2018-01-08 DIAGNOSIS — D689 Coagulation defect, unspecified: Secondary | ICD-10-CM | POA: Diagnosis not present

## 2018-01-08 DIAGNOSIS — N186 End stage renal disease: Secondary | ICD-10-CM | POA: Diagnosis not present

## 2018-01-08 DIAGNOSIS — D509 Iron deficiency anemia, unspecified: Secondary | ICD-10-CM | POA: Diagnosis not present

## 2018-01-08 DIAGNOSIS — Z23 Encounter for immunization: Secondary | ICD-10-CM | POA: Diagnosis not present

## 2018-01-08 DIAGNOSIS — D631 Anemia in chronic kidney disease: Secondary | ICD-10-CM | POA: Diagnosis not present

## 2018-01-08 DIAGNOSIS — N2581 Secondary hyperparathyroidism of renal origin: Secondary | ICD-10-CM | POA: Diagnosis not present

## 2018-01-10 DIAGNOSIS — D509 Iron deficiency anemia, unspecified: Secondary | ICD-10-CM | POA: Diagnosis not present

## 2018-01-10 DIAGNOSIS — N186 End stage renal disease: Secondary | ICD-10-CM | POA: Diagnosis not present

## 2018-01-10 DIAGNOSIS — D689 Coagulation defect, unspecified: Secondary | ICD-10-CM | POA: Diagnosis not present

## 2018-01-10 DIAGNOSIS — D631 Anemia in chronic kidney disease: Secondary | ICD-10-CM | POA: Diagnosis not present

## 2018-01-10 DIAGNOSIS — Z23 Encounter for immunization: Secondary | ICD-10-CM | POA: Diagnosis not present

## 2018-01-10 DIAGNOSIS — N2581 Secondary hyperparathyroidism of renal origin: Secondary | ICD-10-CM | POA: Diagnosis not present

## 2018-01-13 DIAGNOSIS — N186 End stage renal disease: Secondary | ICD-10-CM | POA: Diagnosis not present

## 2018-01-13 DIAGNOSIS — D509 Iron deficiency anemia, unspecified: Secondary | ICD-10-CM | POA: Diagnosis not present

## 2018-01-13 DIAGNOSIS — D689 Coagulation defect, unspecified: Secondary | ICD-10-CM | POA: Diagnosis not present

## 2018-01-13 DIAGNOSIS — Z23 Encounter for immunization: Secondary | ICD-10-CM | POA: Diagnosis not present

## 2018-01-13 DIAGNOSIS — N2581 Secondary hyperparathyroidism of renal origin: Secondary | ICD-10-CM | POA: Diagnosis not present

## 2018-01-13 DIAGNOSIS — D631 Anemia in chronic kidney disease: Secondary | ICD-10-CM | POA: Diagnosis not present

## 2018-01-15 DIAGNOSIS — Z23 Encounter for immunization: Secondary | ICD-10-CM | POA: Diagnosis not present

## 2018-01-15 DIAGNOSIS — D689 Coagulation defect, unspecified: Secondary | ICD-10-CM | POA: Diagnosis not present

## 2018-01-15 DIAGNOSIS — Z992 Dependence on renal dialysis: Secondary | ICD-10-CM | POA: Diagnosis not present

## 2018-01-15 DIAGNOSIS — N2581 Secondary hyperparathyroidism of renal origin: Secondary | ICD-10-CM | POA: Diagnosis not present

## 2018-01-15 DIAGNOSIS — D509 Iron deficiency anemia, unspecified: Secondary | ICD-10-CM | POA: Diagnosis not present

## 2018-01-15 DIAGNOSIS — I129 Hypertensive chronic kidney disease with stage 1 through stage 4 chronic kidney disease, or unspecified chronic kidney disease: Secondary | ICD-10-CM | POA: Diagnosis not present

## 2018-01-15 DIAGNOSIS — N186 End stage renal disease: Secondary | ICD-10-CM | POA: Diagnosis not present

## 2018-01-15 DIAGNOSIS — D631 Anemia in chronic kidney disease: Secondary | ICD-10-CM | POA: Diagnosis not present

## 2018-01-17 DIAGNOSIS — D509 Iron deficiency anemia, unspecified: Secondary | ICD-10-CM | POA: Diagnosis not present

## 2018-01-17 DIAGNOSIS — N186 End stage renal disease: Secondary | ICD-10-CM | POA: Diagnosis not present

## 2018-01-17 DIAGNOSIS — D631 Anemia in chronic kidney disease: Secondary | ICD-10-CM | POA: Diagnosis not present

## 2018-01-17 DIAGNOSIS — D689 Coagulation defect, unspecified: Secondary | ICD-10-CM | POA: Diagnosis not present

## 2018-01-17 DIAGNOSIS — N2581 Secondary hyperparathyroidism of renal origin: Secondary | ICD-10-CM | POA: Diagnosis not present

## 2018-01-17 DIAGNOSIS — Z23 Encounter for immunization: Secondary | ICD-10-CM | POA: Diagnosis not present

## 2018-01-20 DIAGNOSIS — D631 Anemia in chronic kidney disease: Secondary | ICD-10-CM | POA: Diagnosis not present

## 2018-01-20 DIAGNOSIS — N186 End stage renal disease: Secondary | ICD-10-CM | POA: Diagnosis not present

## 2018-01-20 DIAGNOSIS — D509 Iron deficiency anemia, unspecified: Secondary | ICD-10-CM | POA: Diagnosis not present

## 2018-01-20 DIAGNOSIS — N2581 Secondary hyperparathyroidism of renal origin: Secondary | ICD-10-CM | POA: Diagnosis not present

## 2018-01-20 DIAGNOSIS — D689 Coagulation defect, unspecified: Secondary | ICD-10-CM | POA: Diagnosis not present

## 2018-01-20 DIAGNOSIS — Z23 Encounter for immunization: Secondary | ICD-10-CM | POA: Diagnosis not present

## 2018-01-22 DIAGNOSIS — N186 End stage renal disease: Secondary | ICD-10-CM | POA: Diagnosis not present

## 2018-01-22 DIAGNOSIS — D689 Coagulation defect, unspecified: Secondary | ICD-10-CM | POA: Diagnosis not present

## 2018-01-22 DIAGNOSIS — D509 Iron deficiency anemia, unspecified: Secondary | ICD-10-CM | POA: Diagnosis not present

## 2018-01-22 DIAGNOSIS — Z23 Encounter for immunization: Secondary | ICD-10-CM | POA: Diagnosis not present

## 2018-01-22 DIAGNOSIS — N2581 Secondary hyperparathyroidism of renal origin: Secondary | ICD-10-CM | POA: Diagnosis not present

## 2018-01-22 DIAGNOSIS — D631 Anemia in chronic kidney disease: Secondary | ICD-10-CM | POA: Diagnosis not present

## 2018-01-24 DIAGNOSIS — N186 End stage renal disease: Secondary | ICD-10-CM | POA: Diagnosis not present

## 2018-01-24 DIAGNOSIS — N2581 Secondary hyperparathyroidism of renal origin: Secondary | ICD-10-CM | POA: Diagnosis not present

## 2018-01-24 DIAGNOSIS — D631 Anemia in chronic kidney disease: Secondary | ICD-10-CM | POA: Diagnosis not present

## 2018-01-24 DIAGNOSIS — D509 Iron deficiency anemia, unspecified: Secondary | ICD-10-CM | POA: Diagnosis not present

## 2018-01-24 DIAGNOSIS — Z23 Encounter for immunization: Secondary | ICD-10-CM | POA: Diagnosis not present

## 2018-01-24 DIAGNOSIS — D689 Coagulation defect, unspecified: Secondary | ICD-10-CM | POA: Diagnosis not present

## 2018-01-27 DIAGNOSIS — D631 Anemia in chronic kidney disease: Secondary | ICD-10-CM | POA: Diagnosis not present

## 2018-01-27 DIAGNOSIS — D689 Coagulation defect, unspecified: Secondary | ICD-10-CM | POA: Diagnosis not present

## 2018-01-27 DIAGNOSIS — N2581 Secondary hyperparathyroidism of renal origin: Secondary | ICD-10-CM | POA: Diagnosis not present

## 2018-01-27 DIAGNOSIS — N186 End stage renal disease: Secondary | ICD-10-CM | POA: Diagnosis not present

## 2018-01-27 DIAGNOSIS — Z23 Encounter for immunization: Secondary | ICD-10-CM | POA: Diagnosis not present

## 2018-01-27 DIAGNOSIS — D509 Iron deficiency anemia, unspecified: Secondary | ICD-10-CM | POA: Diagnosis not present

## 2018-01-29 DIAGNOSIS — D689 Coagulation defect, unspecified: Secondary | ICD-10-CM | POA: Diagnosis not present

## 2018-01-29 DIAGNOSIS — Z23 Encounter for immunization: Secondary | ICD-10-CM | POA: Diagnosis not present

## 2018-01-29 DIAGNOSIS — D631 Anemia in chronic kidney disease: Secondary | ICD-10-CM | POA: Diagnosis not present

## 2018-01-29 DIAGNOSIS — N186 End stage renal disease: Secondary | ICD-10-CM | POA: Diagnosis not present

## 2018-01-29 DIAGNOSIS — N2581 Secondary hyperparathyroidism of renal origin: Secondary | ICD-10-CM | POA: Diagnosis not present

## 2018-01-29 DIAGNOSIS — D509 Iron deficiency anemia, unspecified: Secondary | ICD-10-CM | POA: Diagnosis not present

## 2018-01-31 DIAGNOSIS — N2581 Secondary hyperparathyroidism of renal origin: Secondary | ICD-10-CM | POA: Diagnosis not present

## 2018-01-31 DIAGNOSIS — D631 Anemia in chronic kidney disease: Secondary | ICD-10-CM | POA: Diagnosis not present

## 2018-01-31 DIAGNOSIS — D509 Iron deficiency anemia, unspecified: Secondary | ICD-10-CM | POA: Diagnosis not present

## 2018-01-31 DIAGNOSIS — N186 End stage renal disease: Secondary | ICD-10-CM | POA: Diagnosis not present

## 2018-01-31 DIAGNOSIS — Z23 Encounter for immunization: Secondary | ICD-10-CM | POA: Diagnosis not present

## 2018-01-31 DIAGNOSIS — D689 Coagulation defect, unspecified: Secondary | ICD-10-CM | POA: Diagnosis not present

## 2018-02-02 DIAGNOSIS — R3915 Urgency of urination: Secondary | ICD-10-CM | POA: Diagnosis not present

## 2018-02-03 DIAGNOSIS — Z23 Encounter for immunization: Secondary | ICD-10-CM | POA: Diagnosis not present

## 2018-02-03 DIAGNOSIS — D689 Coagulation defect, unspecified: Secondary | ICD-10-CM | POA: Diagnosis not present

## 2018-02-03 DIAGNOSIS — N2581 Secondary hyperparathyroidism of renal origin: Secondary | ICD-10-CM | POA: Diagnosis not present

## 2018-02-03 DIAGNOSIS — D631 Anemia in chronic kidney disease: Secondary | ICD-10-CM | POA: Diagnosis not present

## 2018-02-03 DIAGNOSIS — D509 Iron deficiency anemia, unspecified: Secondary | ICD-10-CM | POA: Diagnosis not present

## 2018-02-03 DIAGNOSIS — N186 End stage renal disease: Secondary | ICD-10-CM | POA: Diagnosis not present

## 2018-02-05 DIAGNOSIS — Z23 Encounter for immunization: Secondary | ICD-10-CM | POA: Diagnosis not present

## 2018-02-05 DIAGNOSIS — N2581 Secondary hyperparathyroidism of renal origin: Secondary | ICD-10-CM | POA: Diagnosis not present

## 2018-02-05 DIAGNOSIS — N186 End stage renal disease: Secondary | ICD-10-CM | POA: Diagnosis not present

## 2018-02-05 DIAGNOSIS — D689 Coagulation defect, unspecified: Secondary | ICD-10-CM | POA: Diagnosis not present

## 2018-02-05 DIAGNOSIS — D509 Iron deficiency anemia, unspecified: Secondary | ICD-10-CM | POA: Diagnosis not present

## 2018-02-05 DIAGNOSIS — D631 Anemia in chronic kidney disease: Secondary | ICD-10-CM | POA: Diagnosis not present

## 2018-02-07 DIAGNOSIS — D509 Iron deficiency anemia, unspecified: Secondary | ICD-10-CM | POA: Diagnosis not present

## 2018-02-07 DIAGNOSIS — D631 Anemia in chronic kidney disease: Secondary | ICD-10-CM | POA: Diagnosis not present

## 2018-02-07 DIAGNOSIS — N2581 Secondary hyperparathyroidism of renal origin: Secondary | ICD-10-CM | POA: Diagnosis not present

## 2018-02-07 DIAGNOSIS — D689 Coagulation defect, unspecified: Secondary | ICD-10-CM | POA: Diagnosis not present

## 2018-02-07 DIAGNOSIS — N186 End stage renal disease: Secondary | ICD-10-CM | POA: Diagnosis not present

## 2018-02-07 DIAGNOSIS — Z23 Encounter for immunization: Secondary | ICD-10-CM | POA: Diagnosis not present

## 2018-02-09 DIAGNOSIS — Z23 Encounter for immunization: Secondary | ICD-10-CM | POA: Diagnosis not present

## 2018-02-09 DIAGNOSIS — N2581 Secondary hyperparathyroidism of renal origin: Secondary | ICD-10-CM | POA: Diagnosis not present

## 2018-02-09 DIAGNOSIS — D509 Iron deficiency anemia, unspecified: Secondary | ICD-10-CM | POA: Diagnosis not present

## 2018-02-09 DIAGNOSIS — D631 Anemia in chronic kidney disease: Secondary | ICD-10-CM | POA: Diagnosis not present

## 2018-02-09 DIAGNOSIS — N186 End stage renal disease: Secondary | ICD-10-CM | POA: Diagnosis not present

## 2018-02-09 DIAGNOSIS — D689 Coagulation defect, unspecified: Secondary | ICD-10-CM | POA: Diagnosis not present

## 2018-02-11 DIAGNOSIS — N186 End stage renal disease: Secondary | ICD-10-CM | POA: Diagnosis not present

## 2018-02-11 DIAGNOSIS — N2581 Secondary hyperparathyroidism of renal origin: Secondary | ICD-10-CM | POA: Diagnosis not present

## 2018-02-11 DIAGNOSIS — D689 Coagulation defect, unspecified: Secondary | ICD-10-CM | POA: Diagnosis not present

## 2018-02-11 DIAGNOSIS — D631 Anemia in chronic kidney disease: Secondary | ICD-10-CM | POA: Diagnosis not present

## 2018-02-11 DIAGNOSIS — Z23 Encounter for immunization: Secondary | ICD-10-CM | POA: Diagnosis not present

## 2018-02-11 DIAGNOSIS — D509 Iron deficiency anemia, unspecified: Secondary | ICD-10-CM | POA: Diagnosis not present

## 2018-02-13 DIAGNOSIS — N186 End stage renal disease: Secondary | ICD-10-CM | POA: Diagnosis not present

## 2018-02-13 DIAGNOSIS — Z992 Dependence on renal dialysis: Secondary | ICD-10-CM | POA: Diagnosis not present

## 2018-02-14 DIAGNOSIS — Z992 Dependence on renal dialysis: Secondary | ICD-10-CM | POA: Diagnosis not present

## 2018-02-14 DIAGNOSIS — I129 Hypertensive chronic kidney disease with stage 1 through stage 4 chronic kidney disease, or unspecified chronic kidney disease: Secondary | ICD-10-CM | POA: Diagnosis not present

## 2018-02-14 DIAGNOSIS — N186 End stage renal disease: Secondary | ICD-10-CM | POA: Diagnosis not present

## 2018-02-17 DIAGNOSIS — D689 Coagulation defect, unspecified: Secondary | ICD-10-CM | POA: Diagnosis not present

## 2018-02-17 DIAGNOSIS — N2581 Secondary hyperparathyroidism of renal origin: Secondary | ICD-10-CM | POA: Diagnosis not present

## 2018-02-17 DIAGNOSIS — D509 Iron deficiency anemia, unspecified: Secondary | ICD-10-CM | POA: Diagnosis not present

## 2018-02-17 DIAGNOSIS — D631 Anemia in chronic kidney disease: Secondary | ICD-10-CM | POA: Diagnosis not present

## 2018-02-17 DIAGNOSIS — Z23 Encounter for immunization: Secondary | ICD-10-CM | POA: Diagnosis not present

## 2018-02-17 DIAGNOSIS — N186 End stage renal disease: Secondary | ICD-10-CM | POA: Diagnosis not present

## 2018-02-19 ENCOUNTER — Encounter: Payer: Self-pay | Admitting: Internal Medicine

## 2018-02-19 DIAGNOSIS — D689 Coagulation defect, unspecified: Secondary | ICD-10-CM | POA: Diagnosis not present

## 2018-02-19 DIAGNOSIS — N2581 Secondary hyperparathyroidism of renal origin: Secondary | ICD-10-CM | POA: Diagnosis not present

## 2018-02-19 DIAGNOSIS — D631 Anemia in chronic kidney disease: Secondary | ICD-10-CM | POA: Diagnosis not present

## 2018-02-19 DIAGNOSIS — N186 End stage renal disease: Secondary | ICD-10-CM | POA: Diagnosis not present

## 2018-02-19 DIAGNOSIS — Z23 Encounter for immunization: Secondary | ICD-10-CM | POA: Diagnosis not present

## 2018-02-19 DIAGNOSIS — D509 Iron deficiency anemia, unspecified: Secondary | ICD-10-CM | POA: Diagnosis not present

## 2018-02-21 DIAGNOSIS — D689 Coagulation defect, unspecified: Secondary | ICD-10-CM | POA: Diagnosis not present

## 2018-02-21 DIAGNOSIS — N186 End stage renal disease: Secondary | ICD-10-CM | POA: Diagnosis not present

## 2018-02-21 DIAGNOSIS — N2581 Secondary hyperparathyroidism of renal origin: Secondary | ICD-10-CM | POA: Diagnosis not present

## 2018-02-21 DIAGNOSIS — D509 Iron deficiency anemia, unspecified: Secondary | ICD-10-CM | POA: Diagnosis not present

## 2018-02-21 DIAGNOSIS — Z23 Encounter for immunization: Secondary | ICD-10-CM | POA: Diagnosis not present

## 2018-02-21 DIAGNOSIS — D631 Anemia in chronic kidney disease: Secondary | ICD-10-CM | POA: Diagnosis not present

## 2018-02-24 DIAGNOSIS — N2581 Secondary hyperparathyroidism of renal origin: Secondary | ICD-10-CM | POA: Diagnosis not present

## 2018-02-24 DIAGNOSIS — D631 Anemia in chronic kidney disease: Secondary | ICD-10-CM | POA: Diagnosis not present

## 2018-02-24 DIAGNOSIS — D689 Coagulation defect, unspecified: Secondary | ICD-10-CM | POA: Diagnosis not present

## 2018-02-24 DIAGNOSIS — Z23 Encounter for immunization: Secondary | ICD-10-CM | POA: Diagnosis not present

## 2018-02-24 DIAGNOSIS — N186 End stage renal disease: Secondary | ICD-10-CM | POA: Diagnosis not present

## 2018-02-24 DIAGNOSIS — D509 Iron deficiency anemia, unspecified: Secondary | ICD-10-CM | POA: Diagnosis not present

## 2018-02-26 DIAGNOSIS — Z23 Encounter for immunization: Secondary | ICD-10-CM | POA: Diagnosis not present

## 2018-02-26 DIAGNOSIS — N2581 Secondary hyperparathyroidism of renal origin: Secondary | ICD-10-CM | POA: Diagnosis not present

## 2018-02-26 DIAGNOSIS — D509 Iron deficiency anemia, unspecified: Secondary | ICD-10-CM | POA: Diagnosis not present

## 2018-02-26 DIAGNOSIS — N186 End stage renal disease: Secondary | ICD-10-CM | POA: Diagnosis not present

## 2018-02-26 DIAGNOSIS — D689 Coagulation defect, unspecified: Secondary | ICD-10-CM | POA: Diagnosis not present

## 2018-02-26 DIAGNOSIS — D631 Anemia in chronic kidney disease: Secondary | ICD-10-CM | POA: Diagnosis not present

## 2018-02-28 DIAGNOSIS — Z23 Encounter for immunization: Secondary | ICD-10-CM | POA: Diagnosis not present

## 2018-02-28 DIAGNOSIS — N186 End stage renal disease: Secondary | ICD-10-CM | POA: Diagnosis not present

## 2018-02-28 DIAGNOSIS — D509 Iron deficiency anemia, unspecified: Secondary | ICD-10-CM | POA: Diagnosis not present

## 2018-02-28 DIAGNOSIS — N2581 Secondary hyperparathyroidism of renal origin: Secondary | ICD-10-CM | POA: Diagnosis not present

## 2018-02-28 DIAGNOSIS — D631 Anemia in chronic kidney disease: Secondary | ICD-10-CM | POA: Diagnosis not present

## 2018-02-28 DIAGNOSIS — D689 Coagulation defect, unspecified: Secondary | ICD-10-CM | POA: Diagnosis not present

## 2018-03-03 DIAGNOSIS — D509 Iron deficiency anemia, unspecified: Secondary | ICD-10-CM | POA: Diagnosis not present

## 2018-03-03 DIAGNOSIS — Z23 Encounter for immunization: Secondary | ICD-10-CM | POA: Diagnosis not present

## 2018-03-03 DIAGNOSIS — N186 End stage renal disease: Secondary | ICD-10-CM | POA: Diagnosis not present

## 2018-03-03 DIAGNOSIS — D631 Anemia in chronic kidney disease: Secondary | ICD-10-CM | POA: Diagnosis not present

## 2018-03-03 DIAGNOSIS — N2581 Secondary hyperparathyroidism of renal origin: Secondary | ICD-10-CM | POA: Diagnosis not present

## 2018-03-03 DIAGNOSIS — D689 Coagulation defect, unspecified: Secondary | ICD-10-CM | POA: Diagnosis not present

## 2018-03-05 DIAGNOSIS — N186 End stage renal disease: Secondary | ICD-10-CM | POA: Diagnosis not present

## 2018-03-05 DIAGNOSIS — N2581 Secondary hyperparathyroidism of renal origin: Secondary | ICD-10-CM | POA: Diagnosis not present

## 2018-03-05 DIAGNOSIS — D689 Coagulation defect, unspecified: Secondary | ICD-10-CM | POA: Diagnosis not present

## 2018-03-05 DIAGNOSIS — D631 Anemia in chronic kidney disease: Secondary | ICD-10-CM | POA: Diagnosis not present

## 2018-03-05 DIAGNOSIS — D509 Iron deficiency anemia, unspecified: Secondary | ICD-10-CM | POA: Diagnosis not present

## 2018-03-05 DIAGNOSIS — Z23 Encounter for immunization: Secondary | ICD-10-CM | POA: Diagnosis not present

## 2018-03-07 DIAGNOSIS — Z23 Encounter for immunization: Secondary | ICD-10-CM | POA: Diagnosis not present

## 2018-03-07 DIAGNOSIS — N186 End stage renal disease: Secondary | ICD-10-CM | POA: Diagnosis not present

## 2018-03-07 DIAGNOSIS — D631 Anemia in chronic kidney disease: Secondary | ICD-10-CM | POA: Diagnosis not present

## 2018-03-07 DIAGNOSIS — N2581 Secondary hyperparathyroidism of renal origin: Secondary | ICD-10-CM | POA: Diagnosis not present

## 2018-03-07 DIAGNOSIS — D509 Iron deficiency anemia, unspecified: Secondary | ICD-10-CM | POA: Diagnosis not present

## 2018-03-07 DIAGNOSIS — D689 Coagulation defect, unspecified: Secondary | ICD-10-CM | POA: Diagnosis not present

## 2018-03-09 DIAGNOSIS — N2581 Secondary hyperparathyroidism of renal origin: Secondary | ICD-10-CM | POA: Diagnosis not present

## 2018-03-09 DIAGNOSIS — D631 Anemia in chronic kidney disease: Secondary | ICD-10-CM | POA: Diagnosis not present

## 2018-03-09 DIAGNOSIS — Z23 Encounter for immunization: Secondary | ICD-10-CM | POA: Diagnosis not present

## 2018-03-09 DIAGNOSIS — D509 Iron deficiency anemia, unspecified: Secondary | ICD-10-CM | POA: Diagnosis not present

## 2018-03-09 DIAGNOSIS — N186 End stage renal disease: Secondary | ICD-10-CM | POA: Diagnosis not present

## 2018-03-09 DIAGNOSIS — D689 Coagulation defect, unspecified: Secondary | ICD-10-CM | POA: Diagnosis not present

## 2018-03-12 DIAGNOSIS — D689 Coagulation defect, unspecified: Secondary | ICD-10-CM | POA: Diagnosis not present

## 2018-03-12 DIAGNOSIS — Z23 Encounter for immunization: Secondary | ICD-10-CM | POA: Diagnosis not present

## 2018-03-12 DIAGNOSIS — D509 Iron deficiency anemia, unspecified: Secondary | ICD-10-CM | POA: Diagnosis not present

## 2018-03-12 DIAGNOSIS — D631 Anemia in chronic kidney disease: Secondary | ICD-10-CM | POA: Diagnosis not present

## 2018-03-12 DIAGNOSIS — N2581 Secondary hyperparathyroidism of renal origin: Secondary | ICD-10-CM | POA: Diagnosis not present

## 2018-03-12 DIAGNOSIS — N186 End stage renal disease: Secondary | ICD-10-CM | POA: Diagnosis not present

## 2018-03-14 DIAGNOSIS — Z23 Encounter for immunization: Secondary | ICD-10-CM | POA: Diagnosis not present

## 2018-03-14 DIAGNOSIS — N2581 Secondary hyperparathyroidism of renal origin: Secondary | ICD-10-CM | POA: Diagnosis not present

## 2018-03-14 DIAGNOSIS — D689 Coagulation defect, unspecified: Secondary | ICD-10-CM | POA: Diagnosis not present

## 2018-03-14 DIAGNOSIS — D631 Anemia in chronic kidney disease: Secondary | ICD-10-CM | POA: Diagnosis not present

## 2018-03-14 DIAGNOSIS — N186 End stage renal disease: Secondary | ICD-10-CM | POA: Diagnosis not present

## 2018-03-14 DIAGNOSIS — D509 Iron deficiency anemia, unspecified: Secondary | ICD-10-CM | POA: Diagnosis not present

## 2018-03-16 DIAGNOSIS — D689 Coagulation defect, unspecified: Secondary | ICD-10-CM | POA: Diagnosis not present

## 2018-03-16 DIAGNOSIS — N2581 Secondary hyperparathyroidism of renal origin: Secondary | ICD-10-CM | POA: Diagnosis not present

## 2018-03-16 DIAGNOSIS — Z23 Encounter for immunization: Secondary | ICD-10-CM | POA: Diagnosis not present

## 2018-03-16 DIAGNOSIS — D509 Iron deficiency anemia, unspecified: Secondary | ICD-10-CM | POA: Diagnosis not present

## 2018-03-16 DIAGNOSIS — N186 End stage renal disease: Secondary | ICD-10-CM | POA: Diagnosis not present

## 2018-03-16 DIAGNOSIS — D631 Anemia in chronic kidney disease: Secondary | ICD-10-CM | POA: Diagnosis not present

## 2018-03-17 DIAGNOSIS — I129 Hypertensive chronic kidney disease with stage 1 through stage 4 chronic kidney disease, or unspecified chronic kidney disease: Secondary | ICD-10-CM | POA: Diagnosis not present

## 2018-03-17 DIAGNOSIS — Z992 Dependence on renal dialysis: Secondary | ICD-10-CM | POA: Diagnosis not present

## 2018-03-17 DIAGNOSIS — N186 End stage renal disease: Secondary | ICD-10-CM | POA: Diagnosis not present

## 2018-03-19 DIAGNOSIS — E876 Hypokalemia: Secondary | ICD-10-CM | POA: Diagnosis not present

## 2018-03-19 DIAGNOSIS — N186 End stage renal disease: Secondary | ICD-10-CM | POA: Diagnosis not present

## 2018-03-19 DIAGNOSIS — D689 Coagulation defect, unspecified: Secondary | ICD-10-CM | POA: Diagnosis not present

## 2018-03-19 DIAGNOSIS — D509 Iron deficiency anemia, unspecified: Secondary | ICD-10-CM | POA: Diagnosis not present

## 2018-03-19 DIAGNOSIS — N2581 Secondary hyperparathyroidism of renal origin: Secondary | ICD-10-CM | POA: Diagnosis not present

## 2018-03-19 DIAGNOSIS — R52 Pain, unspecified: Secondary | ICD-10-CM | POA: Diagnosis not present

## 2018-03-19 DIAGNOSIS — D631 Anemia in chronic kidney disease: Secondary | ICD-10-CM | POA: Diagnosis not present

## 2018-03-21 DIAGNOSIS — D509 Iron deficiency anemia, unspecified: Secondary | ICD-10-CM | POA: Diagnosis not present

## 2018-03-21 DIAGNOSIS — D689 Coagulation defect, unspecified: Secondary | ICD-10-CM | POA: Diagnosis not present

## 2018-03-21 DIAGNOSIS — D631 Anemia in chronic kidney disease: Secondary | ICD-10-CM | POA: Diagnosis not present

## 2018-03-21 DIAGNOSIS — N186 End stage renal disease: Secondary | ICD-10-CM | POA: Diagnosis not present

## 2018-03-21 DIAGNOSIS — R52 Pain, unspecified: Secondary | ICD-10-CM | POA: Diagnosis not present

## 2018-03-21 DIAGNOSIS — N2581 Secondary hyperparathyroidism of renal origin: Secondary | ICD-10-CM | POA: Diagnosis not present

## 2018-03-21 DIAGNOSIS — E876 Hypokalemia: Secondary | ICD-10-CM | POA: Diagnosis not present

## 2018-03-24 DIAGNOSIS — E876 Hypokalemia: Secondary | ICD-10-CM | POA: Diagnosis not present

## 2018-03-24 DIAGNOSIS — R52 Pain, unspecified: Secondary | ICD-10-CM | POA: Diagnosis not present

## 2018-03-24 DIAGNOSIS — D509 Iron deficiency anemia, unspecified: Secondary | ICD-10-CM | POA: Diagnosis not present

## 2018-03-24 DIAGNOSIS — N186 End stage renal disease: Secondary | ICD-10-CM | POA: Diagnosis not present

## 2018-03-24 DIAGNOSIS — N2581 Secondary hyperparathyroidism of renal origin: Secondary | ICD-10-CM | POA: Diagnosis not present

## 2018-03-24 DIAGNOSIS — D689 Coagulation defect, unspecified: Secondary | ICD-10-CM | POA: Diagnosis not present

## 2018-03-24 DIAGNOSIS — D631 Anemia in chronic kidney disease: Secondary | ICD-10-CM | POA: Diagnosis not present

## 2018-03-26 DIAGNOSIS — N2581 Secondary hyperparathyroidism of renal origin: Secondary | ICD-10-CM | POA: Diagnosis not present

## 2018-03-26 DIAGNOSIS — E876 Hypokalemia: Secondary | ICD-10-CM | POA: Diagnosis not present

## 2018-03-26 DIAGNOSIS — D631 Anemia in chronic kidney disease: Secondary | ICD-10-CM | POA: Diagnosis not present

## 2018-03-26 DIAGNOSIS — N186 End stage renal disease: Secondary | ICD-10-CM | POA: Diagnosis not present

## 2018-03-26 DIAGNOSIS — D689 Coagulation defect, unspecified: Secondary | ICD-10-CM | POA: Diagnosis not present

## 2018-03-26 DIAGNOSIS — R52 Pain, unspecified: Secondary | ICD-10-CM | POA: Diagnosis not present

## 2018-03-26 DIAGNOSIS — D509 Iron deficiency anemia, unspecified: Secondary | ICD-10-CM | POA: Diagnosis not present

## 2018-03-28 DIAGNOSIS — R52 Pain, unspecified: Secondary | ICD-10-CM | POA: Diagnosis not present

## 2018-03-28 DIAGNOSIS — D509 Iron deficiency anemia, unspecified: Secondary | ICD-10-CM | POA: Diagnosis not present

## 2018-03-28 DIAGNOSIS — E876 Hypokalemia: Secondary | ICD-10-CM | POA: Diagnosis not present

## 2018-03-28 DIAGNOSIS — N2581 Secondary hyperparathyroidism of renal origin: Secondary | ICD-10-CM | POA: Diagnosis not present

## 2018-03-28 DIAGNOSIS — N186 End stage renal disease: Secondary | ICD-10-CM | POA: Diagnosis not present

## 2018-03-28 DIAGNOSIS — D631 Anemia in chronic kidney disease: Secondary | ICD-10-CM | POA: Diagnosis not present

## 2018-03-28 DIAGNOSIS — D689 Coagulation defect, unspecified: Secondary | ICD-10-CM | POA: Diagnosis not present

## 2018-03-30 DIAGNOSIS — K219 Gastro-esophageal reflux disease without esophagitis: Secondary | ICD-10-CM | POA: Diagnosis not present

## 2018-03-30 DIAGNOSIS — R142 Eructation: Secondary | ICD-10-CM | POA: Diagnosis not present

## 2018-03-30 DIAGNOSIS — Z23 Encounter for immunization: Secondary | ICD-10-CM | POA: Diagnosis not present

## 2018-03-31 DIAGNOSIS — D689 Coagulation defect, unspecified: Secondary | ICD-10-CM | POA: Diagnosis not present

## 2018-03-31 DIAGNOSIS — R52 Pain, unspecified: Secondary | ICD-10-CM | POA: Diagnosis not present

## 2018-03-31 DIAGNOSIS — D509 Iron deficiency anemia, unspecified: Secondary | ICD-10-CM | POA: Diagnosis not present

## 2018-03-31 DIAGNOSIS — N2581 Secondary hyperparathyroidism of renal origin: Secondary | ICD-10-CM | POA: Diagnosis not present

## 2018-03-31 DIAGNOSIS — N186 End stage renal disease: Secondary | ICD-10-CM | POA: Diagnosis not present

## 2018-03-31 DIAGNOSIS — E876 Hypokalemia: Secondary | ICD-10-CM | POA: Diagnosis not present

## 2018-03-31 DIAGNOSIS — D631 Anemia in chronic kidney disease: Secondary | ICD-10-CM | POA: Diagnosis not present

## 2018-04-02 DIAGNOSIS — D509 Iron deficiency anemia, unspecified: Secondary | ICD-10-CM | POA: Diagnosis not present

## 2018-04-02 DIAGNOSIS — R52 Pain, unspecified: Secondary | ICD-10-CM | POA: Diagnosis not present

## 2018-04-02 DIAGNOSIS — E876 Hypokalemia: Secondary | ICD-10-CM | POA: Diagnosis not present

## 2018-04-02 DIAGNOSIS — D631 Anemia in chronic kidney disease: Secondary | ICD-10-CM | POA: Diagnosis not present

## 2018-04-02 DIAGNOSIS — D689 Coagulation defect, unspecified: Secondary | ICD-10-CM | POA: Diagnosis not present

## 2018-04-02 DIAGNOSIS — N186 End stage renal disease: Secondary | ICD-10-CM | POA: Diagnosis not present

## 2018-04-02 DIAGNOSIS — N2581 Secondary hyperparathyroidism of renal origin: Secondary | ICD-10-CM | POA: Diagnosis not present

## 2018-04-04 DIAGNOSIS — N2581 Secondary hyperparathyroidism of renal origin: Secondary | ICD-10-CM | POA: Diagnosis not present

## 2018-04-04 DIAGNOSIS — D509 Iron deficiency anemia, unspecified: Secondary | ICD-10-CM | POA: Diagnosis not present

## 2018-04-04 DIAGNOSIS — R52 Pain, unspecified: Secondary | ICD-10-CM | POA: Diagnosis not present

## 2018-04-04 DIAGNOSIS — E876 Hypokalemia: Secondary | ICD-10-CM | POA: Diagnosis not present

## 2018-04-04 DIAGNOSIS — D631 Anemia in chronic kidney disease: Secondary | ICD-10-CM | POA: Diagnosis not present

## 2018-04-04 DIAGNOSIS — N186 End stage renal disease: Secondary | ICD-10-CM | POA: Diagnosis not present

## 2018-04-04 DIAGNOSIS — D689 Coagulation defect, unspecified: Secondary | ICD-10-CM | POA: Diagnosis not present

## 2018-04-07 DIAGNOSIS — D509 Iron deficiency anemia, unspecified: Secondary | ICD-10-CM | POA: Diagnosis not present

## 2018-04-07 DIAGNOSIS — R52 Pain, unspecified: Secondary | ICD-10-CM | POA: Diagnosis not present

## 2018-04-07 DIAGNOSIS — D631 Anemia in chronic kidney disease: Secondary | ICD-10-CM | POA: Diagnosis not present

## 2018-04-07 DIAGNOSIS — E876 Hypokalemia: Secondary | ICD-10-CM | POA: Diagnosis not present

## 2018-04-07 DIAGNOSIS — N186 End stage renal disease: Secondary | ICD-10-CM | POA: Diagnosis not present

## 2018-04-07 DIAGNOSIS — D689 Coagulation defect, unspecified: Secondary | ICD-10-CM | POA: Diagnosis not present

## 2018-04-07 DIAGNOSIS — N2581 Secondary hyperparathyroidism of renal origin: Secondary | ICD-10-CM | POA: Diagnosis not present

## 2018-04-09 DIAGNOSIS — N186 End stage renal disease: Secondary | ICD-10-CM | POA: Diagnosis not present

## 2018-04-09 DIAGNOSIS — D689 Coagulation defect, unspecified: Secondary | ICD-10-CM | POA: Diagnosis not present

## 2018-04-09 DIAGNOSIS — D509 Iron deficiency anemia, unspecified: Secondary | ICD-10-CM | POA: Diagnosis not present

## 2018-04-09 DIAGNOSIS — E876 Hypokalemia: Secondary | ICD-10-CM | POA: Diagnosis not present

## 2018-04-09 DIAGNOSIS — N2581 Secondary hyperparathyroidism of renal origin: Secondary | ICD-10-CM | POA: Diagnosis not present

## 2018-04-09 DIAGNOSIS — D631 Anemia in chronic kidney disease: Secondary | ICD-10-CM | POA: Diagnosis not present

## 2018-04-09 DIAGNOSIS — R52 Pain, unspecified: Secondary | ICD-10-CM | POA: Diagnosis not present

## 2018-04-11 DIAGNOSIS — R52 Pain, unspecified: Secondary | ICD-10-CM | POA: Diagnosis not present

## 2018-04-11 DIAGNOSIS — N186 End stage renal disease: Secondary | ICD-10-CM | POA: Diagnosis not present

## 2018-04-11 DIAGNOSIS — E876 Hypokalemia: Secondary | ICD-10-CM | POA: Diagnosis not present

## 2018-04-11 DIAGNOSIS — D509 Iron deficiency anemia, unspecified: Secondary | ICD-10-CM | POA: Diagnosis not present

## 2018-04-11 DIAGNOSIS — D631 Anemia in chronic kidney disease: Secondary | ICD-10-CM | POA: Diagnosis not present

## 2018-04-11 DIAGNOSIS — D689 Coagulation defect, unspecified: Secondary | ICD-10-CM | POA: Diagnosis not present

## 2018-04-11 DIAGNOSIS — N2581 Secondary hyperparathyroidism of renal origin: Secondary | ICD-10-CM | POA: Diagnosis not present

## 2018-04-14 DIAGNOSIS — E876 Hypokalemia: Secondary | ICD-10-CM | POA: Diagnosis not present

## 2018-04-14 DIAGNOSIS — D689 Coagulation defect, unspecified: Secondary | ICD-10-CM | POA: Diagnosis not present

## 2018-04-14 DIAGNOSIS — D631 Anemia in chronic kidney disease: Secondary | ICD-10-CM | POA: Diagnosis not present

## 2018-04-14 DIAGNOSIS — D509 Iron deficiency anemia, unspecified: Secondary | ICD-10-CM | POA: Diagnosis not present

## 2018-04-14 DIAGNOSIS — N2581 Secondary hyperparathyroidism of renal origin: Secondary | ICD-10-CM | POA: Diagnosis not present

## 2018-04-14 DIAGNOSIS — R52 Pain, unspecified: Secondary | ICD-10-CM | POA: Diagnosis not present

## 2018-04-14 DIAGNOSIS — N186 End stage renal disease: Secondary | ICD-10-CM | POA: Diagnosis not present

## 2018-04-16 DIAGNOSIS — R52 Pain, unspecified: Secondary | ICD-10-CM | POA: Diagnosis not present

## 2018-04-16 DIAGNOSIS — N2581 Secondary hyperparathyroidism of renal origin: Secondary | ICD-10-CM | POA: Diagnosis not present

## 2018-04-16 DIAGNOSIS — N186 End stage renal disease: Secondary | ICD-10-CM | POA: Diagnosis not present

## 2018-04-16 DIAGNOSIS — D509 Iron deficiency anemia, unspecified: Secondary | ICD-10-CM | POA: Diagnosis not present

## 2018-04-16 DIAGNOSIS — D689 Coagulation defect, unspecified: Secondary | ICD-10-CM | POA: Diagnosis not present

## 2018-04-16 DIAGNOSIS — E876 Hypokalemia: Secondary | ICD-10-CM | POA: Diagnosis not present

## 2018-04-16 DIAGNOSIS — D631 Anemia in chronic kidney disease: Secondary | ICD-10-CM | POA: Diagnosis not present

## 2018-04-17 DIAGNOSIS — N186 End stage renal disease: Secondary | ICD-10-CM | POA: Diagnosis not present

## 2018-04-17 DIAGNOSIS — I129 Hypertensive chronic kidney disease with stage 1 through stage 4 chronic kidney disease, or unspecified chronic kidney disease: Secondary | ICD-10-CM | POA: Diagnosis not present

## 2018-04-17 DIAGNOSIS — Z992 Dependence on renal dialysis: Secondary | ICD-10-CM | POA: Diagnosis not present

## 2018-04-18 DIAGNOSIS — D689 Coagulation defect, unspecified: Secondary | ICD-10-CM | POA: Diagnosis not present

## 2018-04-18 DIAGNOSIS — N2581 Secondary hyperparathyroidism of renal origin: Secondary | ICD-10-CM | POA: Diagnosis not present

## 2018-04-18 DIAGNOSIS — D631 Anemia in chronic kidney disease: Secondary | ICD-10-CM | POA: Diagnosis not present

## 2018-04-18 DIAGNOSIS — E876 Hypokalemia: Secondary | ICD-10-CM | POA: Diagnosis not present

## 2018-04-18 DIAGNOSIS — N186 End stage renal disease: Secondary | ICD-10-CM | POA: Diagnosis not present

## 2018-04-21 DIAGNOSIS — E876 Hypokalemia: Secondary | ICD-10-CM | POA: Diagnosis not present

## 2018-04-21 DIAGNOSIS — N186 End stage renal disease: Secondary | ICD-10-CM | POA: Diagnosis not present

## 2018-04-21 DIAGNOSIS — D631 Anemia in chronic kidney disease: Secondary | ICD-10-CM | POA: Diagnosis not present

## 2018-04-21 DIAGNOSIS — N2581 Secondary hyperparathyroidism of renal origin: Secondary | ICD-10-CM | POA: Diagnosis not present

## 2018-04-21 DIAGNOSIS — D689 Coagulation defect, unspecified: Secondary | ICD-10-CM | POA: Diagnosis not present

## 2018-04-22 DIAGNOSIS — R142 Eructation: Secondary | ICD-10-CM | POA: Diagnosis not present

## 2018-04-22 DIAGNOSIS — Z8679 Personal history of other diseases of the circulatory system: Secondary | ICD-10-CM | POA: Diagnosis not present

## 2018-04-22 DIAGNOSIS — K219 Gastro-esophageal reflux disease without esophagitis: Secondary | ICD-10-CM | POA: Diagnosis not present

## 2018-04-22 DIAGNOSIS — N186 End stage renal disease: Secondary | ICD-10-CM | POA: Diagnosis not present

## 2018-04-23 ENCOUNTER — Telehealth: Payer: Self-pay

## 2018-04-23 DIAGNOSIS — E876 Hypokalemia: Secondary | ICD-10-CM | POA: Diagnosis not present

## 2018-04-23 DIAGNOSIS — D631 Anemia in chronic kidney disease: Secondary | ICD-10-CM | POA: Diagnosis not present

## 2018-04-23 DIAGNOSIS — D689 Coagulation defect, unspecified: Secondary | ICD-10-CM | POA: Diagnosis not present

## 2018-04-23 DIAGNOSIS — N2581 Secondary hyperparathyroidism of renal origin: Secondary | ICD-10-CM | POA: Diagnosis not present

## 2018-04-23 DIAGNOSIS — N186 End stage renal disease: Secondary | ICD-10-CM | POA: Diagnosis not present

## 2018-04-23 NOTE — Telephone Encounter (Signed)
Fax Received from [facility].  Refill request for Carvedilol 3.125 twice a day.  Per Patients chart he is currently taking Carvedilol 6.25 twice a day.   LMOV to confirm which does patient is currently taking.

## 2018-04-25 DIAGNOSIS — N186 End stage renal disease: Secondary | ICD-10-CM | POA: Diagnosis not present

## 2018-04-25 DIAGNOSIS — D631 Anemia in chronic kidney disease: Secondary | ICD-10-CM | POA: Diagnosis not present

## 2018-04-25 DIAGNOSIS — N2581 Secondary hyperparathyroidism of renal origin: Secondary | ICD-10-CM | POA: Diagnosis not present

## 2018-04-25 DIAGNOSIS — E876 Hypokalemia: Secondary | ICD-10-CM | POA: Diagnosis not present

## 2018-04-25 DIAGNOSIS — D689 Coagulation defect, unspecified: Secondary | ICD-10-CM | POA: Diagnosis not present

## 2018-04-27 DIAGNOSIS — K2930 Chronic superficial gastritis without bleeding: Secondary | ICD-10-CM | POA: Diagnosis not present

## 2018-04-27 DIAGNOSIS — R140 Abdominal distension (gaseous): Secondary | ICD-10-CM | POA: Diagnosis not present

## 2018-04-27 DIAGNOSIS — K228 Other specified diseases of esophagus: Secondary | ICD-10-CM | POA: Diagnosis not present

## 2018-04-27 DIAGNOSIS — K449 Diaphragmatic hernia without obstruction or gangrene: Secondary | ICD-10-CM | POA: Diagnosis not present

## 2018-04-27 DIAGNOSIS — K219 Gastro-esophageal reflux disease without esophagitis: Secondary | ICD-10-CM | POA: Diagnosis not present

## 2018-04-28 ENCOUNTER — Encounter (HOSPITAL_COMMUNITY): Payer: Self-pay

## 2018-04-28 ENCOUNTER — Other Ambulatory Visit: Payer: Self-pay

## 2018-04-28 ENCOUNTER — Emergency Department (HOSPITAL_COMMUNITY)
Admission: EM | Admit: 2018-04-28 | Discharge: 2018-04-28 | Disposition: A | Discharge status: Home / Self Care | Payer: Medicare Other | Attending: Emergency Medicine | Admitting: Emergency Medicine

## 2018-04-28 DIAGNOSIS — D631 Anemia in chronic kidney disease: Secondary | ICD-10-CM | POA: Diagnosis not present

## 2018-04-28 DIAGNOSIS — W19XXXA Unspecified fall, initial encounter: Secondary | ICD-10-CM | POA: Diagnosis not present

## 2018-04-28 DIAGNOSIS — T829XXA Unspecified complication of cardiac and vascular prosthetic device, implant and graft, initial encounter: Secondary | ICD-10-CM | POA: Diagnosis not present

## 2018-04-28 DIAGNOSIS — Y712 Prosthetic and other implants, materials and accessory cardiovascular devices associated with adverse incidents: Secondary | ICD-10-CM | POA: Insufficient documentation

## 2018-04-28 DIAGNOSIS — N2581 Secondary hyperparathyroidism of renal origin: Secondary | ICD-10-CM | POA: Diagnosis not present

## 2018-04-28 DIAGNOSIS — I252 Old myocardial infarction: Secondary | ICD-10-CM | POA: Insufficient documentation

## 2018-04-28 DIAGNOSIS — W07XXXA Fall from chair, initial encounter: Secondary | ICD-10-CM | POA: Diagnosis not present

## 2018-04-28 DIAGNOSIS — R58 Hemorrhage, not elsewhere classified: Secondary | ICD-10-CM | POA: Diagnosis not present

## 2018-04-28 DIAGNOSIS — E876 Hypokalemia: Secondary | ICD-10-CM | POA: Diagnosis not present

## 2018-04-28 DIAGNOSIS — Z87891 Personal history of nicotine dependence: Secondary | ICD-10-CM | POA: Insufficient documentation

## 2018-04-28 DIAGNOSIS — Z8546 Personal history of malignant neoplasm of prostate: Secondary | ICD-10-CM | POA: Insufficient documentation

## 2018-04-28 DIAGNOSIS — I120 Hypertensive chronic kidney disease with stage 5 chronic kidney disease or end stage renal disease: Secondary | ICD-10-CM | POA: Diagnosis not present

## 2018-04-28 DIAGNOSIS — Z992 Dependence on renal dialysis: Secondary | ICD-10-CM | POA: Diagnosis not present

## 2018-04-28 DIAGNOSIS — Z79899 Other long term (current) drug therapy: Secondary | ICD-10-CM | POA: Insufficient documentation

## 2018-04-28 DIAGNOSIS — T85698A Other mechanical complication of other specified internal prosthetic devices, implants and grafts, initial encounter: Secondary | ICD-10-CM | POA: Diagnosis not present

## 2018-04-28 DIAGNOSIS — N186 End stage renal disease: Secondary | ICD-10-CM | POA: Diagnosis not present

## 2018-04-28 DIAGNOSIS — T8249XA Other complication of vascular dialysis catheter, initial encounter: Secondary | ICD-10-CM | POA: Diagnosis not present

## 2018-04-28 DIAGNOSIS — D689 Coagulation defect, unspecified: Secondary | ICD-10-CM | POA: Diagnosis not present

## 2018-04-28 MED ORDER — HEPARIN SOD (PORK) LOCK FLUSH 100 UNIT/ML IV SOLN
500.0000 [IU] | Freq: Once | INTRAVENOUS | Status: AC
  Administered 2018-04-28: 500 [IU] via INTRAVENOUS
  Filled 2018-04-28 (×2): qty 5

## 2018-04-28 NOTE — ED Notes (Signed)
Pt denies pain or feeling like he dislodged his vascular access site. Denies CP, SOB. Bleeding has stopped at the hemodialysis catheter site.

## 2018-04-28 NOTE — Discharge Instructions (Addendum)
Return if any problems.  Follow up at Dialysis as scheduled

## 2018-04-28 NOTE — ED Provider Notes (Signed)
[place] EMERGENCY DEPARTMENT Provider Note   CSN: [id number] Arrival date & time: 04/28/18  1600     History   Chief Complaint Chief Complaint  Patient presents with  . Vascular Access Problem    HPI Roger [name] is a 78 y.o. male.  Pt reports he fell forward out of chair at dialysis.  Pt reports he had a cramp and was trying to move.  Pt states the end of his hemodialysis catheter broke off.  Pt reports he was 20 minutes short on his time.  Pt reports Staff had trouble getting bleeding to stop.  Piece is completely off.   The history is provided by the patient. No language interpreter was used.    Past Medical History:  Diagnosis Date  . Anemia   . Anginal pain ([place])   . Arthritis    "all over; bad in the legs" (12/04/2016)  . CAD (coronary artery disease)    a. NSTEMI in setting of anemia; b. 10/2016 MV: inflat ST dep with large, reversible ant, apical, inflat defect; c. 10/2016 Cath: LM nl, LAD 40p, 39m, D1 70, D2 80, RI min irregs, LCX 100ost, RCA 100p, 138m/d, RPDA fills via collats from LAD, RPAV small-->Initial conservative Rx in setting of GIB w/ plan for PCI LAD if H/H stable on ASA/Plavix;  d. 12/04/16 s/p PTCA and DES to mLAD.  Marland Kitchen Depression    situational   . Diastolic dysfunction    a. 10/2016 Echo: EF 55-60%, no rwma, Gr2 DD, triv MR, mildly dil LA.  Marland Kitchen ESRD (end stage renal disease) ([place])    a. 10/2016 HD catheter insterted and HD initiated; b. Pending permanent HD access folllowing Cardiac Revascularization.  . ESRD on dialysis [facility])    "[place]; [address]; TTS usually; going to [facility] in [place], [place] right now while staying w/daughter" (12/04/2016)  . GERD (gastroesophageal reflux disease)   . GIB (gastrointestinal bleeding) 10/2011   a. 10/2016 3u PRBCs - EGD/Colonoscopy: angiodysplasia w/ diverticulosis. No apparent bleeding. Polypecotmy->tubular adenomas.  . Gout   . Headache    no migraines for years  . Heart murmur   . High  cholesterol   . History of blood transfusion 10/18/2016   "related to anemia" (12/04/2016)  . History of kidney stones    years ago  . Hypertension   . Iron deficiency anemia   . Myocardial infarction ([place]) 10/18/2016  . Old MI (myocardial infarction)    "found evidence of this on 10/18/2016"  . PAF (paroxysmal atrial fibrillation) ([place])   . Peripheral vascular disease ([place])    BLE  . Prostate cancer ([place])    a. s/p seed implantation.  . Tobacco abuse     Patient Active Problem List   Diagnosis Date Noted  . Chest pain 07/25/2017  . CAD (coronary artery disease) 07/25/2017  . ESRD (end stage renal disease) ([place]) 07/25/2017  . Aortic valve sclerosis   . Essential hypertension   . Pure hypercholesterolemia   . Right hand pain 05/28/2017  . Angiodysplasia of cecum   . Benign neoplasm of cecum   . Benign neoplasm of ascending colon   . Benign neoplasm of descending colon   . CKD (chronic kidney disease) stage 5, GFR less than 15 ml/min (HCC)   . Melena   . Demand ischemia ([place])   . Lower GI bleed   . Tobacco abuse   . Angina pectoris ([place]) 10/18/2016  . ARF (acute renal failure) ([place]) 10/18/2016  .  Hyperkalemia 10/18/2016  . Blood loss anemia 10/18/2016  . Elevated troponin 10/18/2016  . Atherosclerotic PVD with intermittent claudication ([place]) 12/29/2013  . Peripheral vascular disease, unspecified ([place]) 12/09/2012    Past Surgical History:  Procedure Laterality Date  . AV FISTULA PLACEMENT Right 03/28/2017   Procedure: ARTERIOVENOUS (AV) BRACHIOCEPHALIC FISTULA CREATION;  Surgeon: Angelia Mould, MD;  Location: [place];  Service: Vascular;  Laterality: Right;  . CARDIAC CATHETERIZATION  10/2016  . COLONOSCOPY WITH PROPOFOL N/A 10/21/2016   Procedure: COLONOSCOPY WITH PROPOFOL;  Surgeon: Gatha Mayer, MD;  Location: [facility] ENDOSCOPY;  Service: Endoscopy;  Laterality: N/A;  . CORONARY ANGIOPLASTY WITH STENT PLACEMENT  12/04/2016   "1 stent"  . CORONARY STENT  INTERVENTION N/A 12/04/2016   Procedure: CORONARY STENT INTERVENTION;  Surgeon: Wellington Hampshire, MD;  Location: [place] CV LAB;  Service: Cardiovascular;  Laterality: N/A;  . ESOPHAGOGASTRODUODENOSCOPY N/A 10/21/2016   Procedure: ESOPHAGOGASTRODUODENOSCOPY (EGD);  Surgeon: Gatha Mayer, MD;  Location[facility] ENDOSCOPY;  Service: Endoscopy;  Laterality: N/A;  . INSERTION OF DIALYSIS CATHETER Right 10/22/2016   Procedure: INSERTION OF TUNNELED DIALYSIS CATHETER;  Surgeon: Elam Dutch, MD;  Location: [place];  Service: Vascular;  Laterality: Right;  . LEFT HEART CATH AND CORONARY ANGIOGRAPHY N/A 10/23/2016   Procedure: LEFT HEART CATH AND CORONARY ANGIOGRAPHY;  Surgeon: Wellington Hampshire, MD;  Location: [place] CV LAB;  Service: Cardiovascular;  Laterality: N/A;  . LIGATION OF ARTERIOVENOUS  FISTULA Right 05/12/2017   Procedure: BANDING OF RIGHT UPPER ARM ARTERIOVENOUS  FISTULA USING 6MM X 10CM GORE TEX VASCULAR GRAFT;  Surgeon: Angelia Mould, MD;  Location: [place];  Service: Vascular;  Laterality: Right;  . LIGATION OF ARTERIOVENOUS  FISTULA Right 07/03/2017   Procedure: LIGATION OF ARTERIOVENOUS  FISTULA RIGHT ARM;  Surgeon: Angelia Mould, MD;  Location: [place];  Service: Vascular;  Laterality: Right;  . TONSILLECTOMY          Home Medications    Prior to Admission medications   Medication Sig Start Date End Date Taking? Authorizing Provider  allopurinol (ZYLOPRIM) 100 MG tablet Take 1 tablet (100 mg total) by mouth daily. 10/26/16   Florencia Reasons, MD  amLODipine (NORVASC) 10 MG tablet Take 1 tablet (10 mg total) by mouth daily. 12/04/17   Theora Gianotti, NP  atorvastatin (LIPITOR) 80 MG tablet Take 1 tablet (80 mg total) by mouth daily at 6 PM. 10/31/17   Theora Gianotti, NP  b complex vitamins tablet Take 1 tablet by mouth See admin instructions. Given at dialysis    [provider]  calcitRIOL (ROCALTROL) 0.25 MCG capsule Take 1 capsule (0.25 mcg  total) by mouth Every Tuesday,Thursday,and Saturday with dialysis. 12/05/16   Bhagat, Crista Luria, PA  carvedilol (COREG) 6.25 MG tablet Take 1 tablet (6.25 mg total) by mouth 2 (two) times daily with a meal. 04/25/17   Wellington Hampshire, MD  clopidogrel (PLAVIX) 75 MG tablet Take 1 tablet (75 mg total) by mouth daily. 12/04/17   Theora Gianotti, NP  ferric citrate (AURYXIA) 1 GM 210 MG(Fe) tablet Take 420 mg by mouth 3 (three) times daily with meals.     [provider]  isosorbide mononitrate (IMDUR) 30 MG 24 hr tablet Take 30 mg by mouth daily. 01/24/17   [provider]  oxyCODONE (ROXICODONE) 5 MG immediate release tablet Take 1 tablet (5 mg total) by mouth every 4 (four) hours as needed for severe pain. 07/03/17   Scot Dock,  Judeth Cornfield, MD  ranitidine (ZANTAC) 150 MG tablet Take 150 mg by mouth daily as needed for heartburn.     [provider]    Family History Family History  Problem Relation Age of Onset  . Cancer Mother   . Cancer Father   . Heart attack Father   . Diabetes Father   . Hypertension Father   . Cancer Sister   . Cancer Brother   . Diabetes Brother   . Hypertension Brother   . Hypertension Daughter     Social History Social History   Tobacco Use  . Smoking status: Former Smoker    Packs/day: 0.50    Years: 50.00    Pack years: 25.00    Types: Cigarettes    Last attempt to quit: 10/18/2016    Years since quitting: 1.5  . Smokeless tobacco: Never Used  Substance Use Topics  . Alcohol use: No  . Drug use: No     Allergies   Patient has no known allergies.   Review of Systems Review of Systems  All other systems reviewed and are negative.    Physical Exam Updated Vital Signs BP (!) 104/51 (BP Location: Right Arm)   Pulse 60   Temp 98.1 F (36.7 C) (Oral)   Resp (!) 25   SpO2 100%   Physical Exam Vitals signs and nursing note reviewed.  Constitutional:      Appearance: He is well-developed.  HENT:      Head: Normocephalic and atraumatic.  Eyes:     Conjunctiva/sclera: Conjunctivae normal.  Neck:     Musculoskeletal: Neck supple.  Cardiovascular:     Rate and Rhythm: Normal rate and regular rhythm.     Heart sounds: No murmur.  Pulmonary:     Effort: Pulmonary effort is normal. No respiratory distress.     Breath sounds: Normal breath sounds.  Abdominal:     Palpations: Abdomen is soft.     Tenderness: There is no abdominal tenderness.  Musculoskeletal: Normal range of motion.     Comments: Cath right neck.  Blue tip in place, right tip is broken   Skin:    General: Skin is warm and dry.  Neurological:     Mental Status: He is alert.      ED Treatments / Results  Labs (all labs ordered are listed, but only abnormal results are displayed) Labs Reviewed - No data to display  EKG None  Radiology No results found.  Procedures Procedures (including critical care time)  Medications Ordered in ED Medications - No data to display   Initial Impression / Assessment and Plan / ED Course  I have reviewed the triage vital signs and the nursing notes.  Pertinent labs & imaging results that were available during my care of the patient were reviewed by me and considered in my medical decision making (see chart for details).         MDM  I spoke to Dr. Scot Dock who saw pt here and replaced distal portion of port at the Y of the line.    Final Clinical Impressions(s) / ED Diagnoses   Final diagnoses:  Complication of vascular access for dialysis, initial encounter    ED Discharge Orders    None    An After Visit Summary was printed and given to the patient.    Sidney Ace 04/28/18 2025    Virgel Manifold, MD 04/29/18 1230

## 2018-04-28 NOTE — ED Triage Notes (Signed)
To triage via [facility],  Pt was at dialysis, fell out of chair while cramping in his hand with vas-cath coming out slightly. Bleeding controlled. VSS.

## 2018-04-28 NOTE — Consult Note (Signed)
VASCULAR SURGERY:  This patient had broken the end of his tunneled dialysis catheter at dialysis today.  I was asked to replace this.  At the bedside I prepped the end of the catheter with Betadine.  The old catheter was divided.  The distal ports were then attached from the new kit and secured.  Both ports withdrew easily with and flushed with saline and filled with concentrated heparin.  Sterile dressing was applied.  [name], MD, [place] [phone] Office: [phone]

## 2018-04-30 DIAGNOSIS — D631 Anemia in chronic kidney disease: Secondary | ICD-10-CM | POA: Diagnosis not present

## 2018-04-30 DIAGNOSIS — N186 End stage renal disease: Secondary | ICD-10-CM | POA: Diagnosis not present

## 2018-04-30 DIAGNOSIS — N2581 Secondary hyperparathyroidism of renal origin: Secondary | ICD-10-CM | POA: Diagnosis not present

## 2018-04-30 DIAGNOSIS — E876 Hypokalemia: Secondary | ICD-10-CM | POA: Diagnosis not present

## 2018-04-30 DIAGNOSIS — D689 Coagulation defect, unspecified: Secondary | ICD-10-CM | POA: Diagnosis not present

## 2018-05-01 DIAGNOSIS — K228 Other specified diseases of esophagus: Secondary | ICD-10-CM | POA: Diagnosis not present

## 2018-05-01 DIAGNOSIS — K2930 Chronic superficial gastritis without bleeding: Secondary | ICD-10-CM | POA: Diagnosis not present

## 2018-05-02 DIAGNOSIS — N2581 Secondary hyperparathyroidism of renal origin: Secondary | ICD-10-CM | POA: Diagnosis not present

## 2018-05-02 DIAGNOSIS — E876 Hypokalemia: Secondary | ICD-10-CM | POA: Diagnosis not present

## 2018-05-02 DIAGNOSIS — D631 Anemia in chronic kidney disease: Secondary | ICD-10-CM | POA: Diagnosis not present

## 2018-05-02 DIAGNOSIS — D689 Coagulation defect, unspecified: Secondary | ICD-10-CM | POA: Diagnosis not present

## 2018-05-02 DIAGNOSIS — N186 End stage renal disease: Secondary | ICD-10-CM | POA: Diagnosis not present

## 2018-05-04 ENCOUNTER — Other Ambulatory Visit: Payer: Self-pay

## 2018-05-04 NOTE — Patient Outreach (Signed)
[facility]) Care Management  05/04/2018  Roger [name] April 12, 1940 [id number]   Telephone Screen  Referral Date: 04/30/2018 Referral Source: [facility] Dept. Referral Reason: "member states that he may take his meds 5 out of 7 days due to stubbornness, taste of pills, size of pills, med co pay assistance Social Security will be stopped after March" Insurance: [employer]   Outreach attempt # 1 to patient. Spoke with patient. He requested call back at another time.       Plan: RN CM will make outreach attempt to patient within 3-4 business days.   Enzo Montgomery, RN,BSN,CCM [place] Management Telephonic Care Management Coordinator Direct Phone: [phone] Toll Free: [phone] Fax: [phone]

## 2018-05-04 NOTE — Patient Outreach (Signed)
[place] [facility]) Care Management  05/04/2018  Roger [name] 08-31-1940 [id number]   Telephone Screen  Referral Date: 04/30/2018 Referral Source: [facility] Dept. Referral Reason: "member states that he may take his meds 5 out of 7 days due to stubbornness, taste of pills, size of pills, med co pay assistance Social Security will be stopped after March" Insurance: [employer]   RN CM attempted call back to patient as he had previously requested. No answer. RN CM left HIPAA compliant voicemail message along with contact info.      Plan: RN CM will make outreach attempt to patient within 3-4 business days.  Enzo Montgomery, RN,BSN,CCM [place] Management Telephonic Care Management Coordinator Direct Phone: [phone] Toll Free: [phone] Fax: [phone]

## 2018-05-05 DIAGNOSIS — N2581 Secondary hyperparathyroidism of renal origin: Secondary | ICD-10-CM | POA: Diagnosis not present

## 2018-05-05 DIAGNOSIS — E876 Hypokalemia: Secondary | ICD-10-CM | POA: Diagnosis not present

## 2018-05-05 DIAGNOSIS — D631 Anemia in chronic kidney disease: Secondary | ICD-10-CM | POA: Diagnosis not present

## 2018-05-05 DIAGNOSIS — D689 Coagulation defect, unspecified: Secondary | ICD-10-CM | POA: Diagnosis not present

## 2018-05-05 DIAGNOSIS — N186 End stage renal disease: Secondary | ICD-10-CM | POA: Diagnosis not present

## 2018-05-06 ENCOUNTER — Other Ambulatory Visit: Payer: Self-pay

## 2018-05-06 NOTE — Patient Outreach (Signed)
[facility]) Care Management  05/06/2018  LEONELL LOBDELL 02-Jan-1941 [id number]   Telephone Screen  Referral Date:04/30/2018 Referral Source:[facility] Dept. Referral Reason:"member states that he may take his meds 5 out of 7 days due to stubbornness, taste of pills, size of pills, med co pay assistance Social Security will be stopped after March" Insurance:HTA   Outreach attempt #2 to patient. No answer. RN CM left HIPAA compliant voicemail message along with contact info.     Plan: RN CM will make outreach attempt to patient within 3-4 business days.   Enzo Montgomery, RN,BSN,CCM [place] Management Telephonic Care Management Coordinator Direct Phone: [phone] Toll Free: [phone] Fax: [phone]

## 2018-05-07 ENCOUNTER — Ambulatory Visit: Payer: Self-pay

## 2018-05-07 DIAGNOSIS — N2581 Secondary hyperparathyroidism of renal origin: Secondary | ICD-10-CM | POA: Diagnosis not present

## 2018-05-07 DIAGNOSIS — E876 Hypokalemia: Secondary | ICD-10-CM | POA: Diagnosis not present

## 2018-05-07 DIAGNOSIS — N186 End stage renal disease: Secondary | ICD-10-CM | POA: Diagnosis not present

## 2018-05-07 DIAGNOSIS — D631 Anemia in chronic kidney disease: Secondary | ICD-10-CM | POA: Diagnosis not present

## 2018-05-07 DIAGNOSIS — D689 Coagulation defect, unspecified: Secondary | ICD-10-CM | POA: Diagnosis not present

## 2018-05-08 ENCOUNTER — Other Ambulatory Visit: Payer: Self-pay

## 2018-05-08 NOTE — Patient Outreach (Signed)
[facility]) Care Management  05/08/2018  KINSLER SOEDER Oct 26, 1940 [id number]   Telephone Screen  Referral Date:04/30/2018 Referral Source:[facility] Dept. Referral Reason:"member states that he may take his meds 5 out of 7 days due to stubbornness, taste of pills, size of pills, med co pay assistance Social Security will be stopped after March" Insurance:HTA   Outreach attempt #3 to patient. No answer. RN CM left HIPAA compliant voicemail message along with contact info.     Plan: RN CM will close case if no response from letter mailed to patient.    Enzo Montgomery, RN,BSN,CCM [place] Management Telephonic Care Management Coordinator Direct Phone: [phone] Toll Free: [phone] Fax: [phone]

## 2018-05-09 DIAGNOSIS — D631 Anemia in chronic kidney disease: Secondary | ICD-10-CM | POA: Diagnosis not present

## 2018-05-09 DIAGNOSIS — N186 End stage renal disease: Secondary | ICD-10-CM | POA: Diagnosis not present

## 2018-05-09 DIAGNOSIS — E876 Hypokalemia: Secondary | ICD-10-CM | POA: Diagnosis not present

## 2018-05-09 DIAGNOSIS — N2581 Secondary hyperparathyroidism of renal origin: Secondary | ICD-10-CM | POA: Diagnosis not present

## 2018-05-09 DIAGNOSIS — D689 Coagulation defect, unspecified: Secondary | ICD-10-CM | POA: Diagnosis not present

## 2018-05-12 DIAGNOSIS — N186 End stage renal disease: Secondary | ICD-10-CM | POA: Diagnosis not present

## 2018-05-12 DIAGNOSIS — E876 Hypokalemia: Secondary | ICD-10-CM | POA: Diagnosis not present

## 2018-05-12 DIAGNOSIS — D689 Coagulation defect, unspecified: Secondary | ICD-10-CM | POA: Diagnosis not present

## 2018-05-12 DIAGNOSIS — N2581 Secondary hyperparathyroidism of renal origin: Secondary | ICD-10-CM | POA: Diagnosis not present

## 2018-05-12 DIAGNOSIS — D631 Anemia in chronic kidney disease: Secondary | ICD-10-CM | POA: Diagnosis not present

## 2018-05-14 ENCOUNTER — Other Ambulatory Visit: Payer: Self-pay

## 2018-05-14 ENCOUNTER — Emergency Department (HOSPITAL_COMMUNITY): Payer: Medicare Other

## 2018-05-14 ENCOUNTER — Encounter (HOSPITAL_COMMUNITY): Payer: Self-pay

## 2018-05-14 ENCOUNTER — Emergency Department (HOSPITAL_COMMUNITY)
Admission: EM | Admit: 2018-05-14 | Discharge: 2018-05-14 | Disposition: A | Discharge status: Home / Self Care | Payer: Medicare Other | Attending: Emergency Medicine | Admitting: Emergency Medicine

## 2018-05-14 DIAGNOSIS — Z992 Dependence on renal dialysis: Secondary | ICD-10-CM | POA: Insufficient documentation

## 2018-05-14 DIAGNOSIS — D631 Anemia in chronic kidney disease: Secondary | ICD-10-CM | POA: Diagnosis not present

## 2018-05-14 DIAGNOSIS — Z955 Presence of coronary angioplasty implant and graft: Secondary | ICD-10-CM | POA: Insufficient documentation

## 2018-05-14 DIAGNOSIS — Z8546 Personal history of malignant neoplasm of prostate: Secondary | ICD-10-CM | POA: Insufficient documentation

## 2018-05-14 DIAGNOSIS — N2581 Secondary hyperparathyroidism of renal origin: Secondary | ICD-10-CM | POA: Diagnosis not present

## 2018-05-14 DIAGNOSIS — R079 Chest pain, unspecified: Secondary | ICD-10-CM | POA: Diagnosis not present

## 2018-05-14 DIAGNOSIS — Z79899 Other long term (current) drug therapy: Secondary | ICD-10-CM | POA: Diagnosis not present

## 2018-05-14 DIAGNOSIS — Z87891 Personal history of nicotine dependence: Secondary | ICD-10-CM | POA: Diagnosis not present

## 2018-05-14 DIAGNOSIS — I2510 Atherosclerotic heart disease of native coronary artery without angina pectoris: Secondary | ICD-10-CM | POA: Insufficient documentation

## 2018-05-14 DIAGNOSIS — N186 End stage renal disease: Secondary | ICD-10-CM | POA: Diagnosis not present

## 2018-05-14 DIAGNOSIS — I120 Hypertensive chronic kidney disease with stage 5 chronic kidney disease or end stage renal disease: Secondary | ICD-10-CM | POA: Insufficient documentation

## 2018-05-14 DIAGNOSIS — I252 Old myocardial infarction: Secondary | ICD-10-CM | POA: Insufficient documentation

## 2018-05-14 DIAGNOSIS — D689 Coagulation defect, unspecified: Secondary | ICD-10-CM | POA: Diagnosis not present

## 2018-05-14 DIAGNOSIS — E876 Hypokalemia: Secondary | ICD-10-CM | POA: Diagnosis not present

## 2018-05-14 LAB — CBC
HCT: 33 % — ABNORMAL LOW (ref 39.0–52.0)
HEMOGLOBIN: 10.4 g/dL — AB (ref 13.0–17.0)
MCH: 33.2 pg (ref 26.0–34.0)
MCHC: 31.5 g/dL (ref 30.0–36.0)
MCV: 105.4 fL — ABNORMAL HIGH (ref 80.0–100.0)
Platelets: 141 10*3/uL — ABNORMAL LOW (ref 150–400)
RBC: 3.13 MIL/uL — AB (ref 4.22–5.81)
RDW: 15.5 % (ref 11.5–15.5)
WBC: 5.5 10*3/uL (ref 4.0–10.5)
nRBC: 0 % (ref 0.0–0.2)

## 2018-05-14 LAB — BASIC METABOLIC PANEL
Anion gap: 11 (ref 5–15)
BUN: 21 mg/dL (ref 8–23)
CO2: 23 mmol/L (ref 22–32)
Calcium: 8.2 mg/dL — ABNORMAL LOW (ref 8.9–10.3)
Chloride: 103 mmol/L (ref 98–111)
Creatinine, Ser: 5.19 mg/dL — ABNORMAL HIGH (ref 0.61–1.24)
GFR calc Af Amer: 12 mL/min — ABNORMAL LOW (ref >60)
GFR, EST NON AFRICAN AMERICAN: 10 mL/min — AB (ref >60)
GLUCOSE: 88 mg/dL (ref 70–99)
Potassium: 4.1 mmol/L (ref 3.5–5.1)
Sodium: 137 mmol/L (ref 135–145)

## 2018-05-14 LAB — I-STAT TROPONIN, ED: TROPONIN I, POC: 0.01 ng/mL (ref 0.00–0.08)

## 2018-05-14 MED ORDER — SODIUM CHLORIDE 0.9% FLUSH: 3.0000 mL | Freq: Once | INTRAVENOUS | Status: DC

## 2018-05-14 MED ORDER — NITROGLYCERIN 0.4 MG SL SUBL: 0.4000 mg | SUBLINGUAL_TABLET | SUBLINGUAL | 0 refills | Status: DC | PRN

## 2018-05-14 MED ORDER — CARVEDILOL 6.25 MG PO TABS: 6.2500 mg | ORAL_TABLET | Freq: Two times a day (BID) | ORAL | 3 refills | Status: DC

## 2018-05-14 NOTE — ED Notes (Signed)
Phlebotomy at bedside.

## 2018-05-14 NOTE — ED Provider Notes (Signed)
[facility] EMERGENCY DEPARTMENT Provider Note   CSN: [id number] Arrival date & time: 05/14/18  1447    History   Chief Complaint Chief Complaint  Patient presents with  . Chest Pain    HPI Roger [name] is a 78 y.o. male.     Chest pain intermittently for several months.  Status post MI in September 2018.  Additionally, he has been on dialysis since that time for end-stage renal disease.  Quit smoking.  No crushing substernal chest pain, dyspnea, diaphoresis.  He has been taking nothing for his pain.  Severity is mild to moderate.     Past Medical History:  Diagnosis Date  . Anemia   . Anginal pain ([place])   . Arthritis    "all over; bad in the legs" (12/04/2016)  . CAD (coronary artery disease)    a. NSTEMI in setting of anemia; b. 10/2016 MV: inflat ST dep with large, reversible ant, apical, inflat defect; c. 10/2016 Cath: LM nl, LAD 40p, 12m, D1 70, D2 80, RI min irregs, LCX 100ost, RCA 100p, 170m/d, RPDA fills via collats from LAD, RPAV small-->Initial conservative Rx in setting of GIB w/ plan for PCI LAD if H/H stable on ASA/Plavix;  d. 12/04/16 s/p PTCA and DES to mLAD.  Marland Kitchen Depression    situational   . Diastolic dysfunction    a. 10/2016 Echo: EF 55-60%, no rwma, Gr2 DD, triv MR, mildly dil LA.  Marland Kitchen ESRD (end stage renal disease) ([place])    a. 10/2016 HD catheter insterted and HD initiated; b. Pending permanent HD access folllowing Cardiac Revascularization.  . ESRD on dialysis [facility])    "[place]; [address]; TTS usually; going to [facility] in [place], [place] right now while staying w/daughter" (12/04/2016)  . GERD (gastroesophageal reflux disease)   . GIB (gastrointestinal bleeding) 10/2011   a. 10/2016 3u PRBCs - EGD/Colonoscopy: angiodysplasia w/ diverticulosis. No apparent bleeding. Polypecotmy->tubular adenomas.  . Gout   . Headache    no migraines for years  . Heart murmur   . High cholesterol   . History of blood transfusion 10/18/2016   "related to  anemia" (12/04/2016)  . History of kidney stones    years ago  . Hypertension   . Iron deficiency anemia   . Myocardial infarction ([place]) 10/18/2016  . Old MI (myocardial infarction)    "found evidence of this on 10/18/2016"  . PAF (paroxysmal atrial fibrillation) ([place])   . Peripheral vascular disease ([place])    BLE  . Prostate cancer ([place])    a. s/p seed implantation.  . Tobacco abuse     Patient Active Problem List   Diagnosis Date Noted  . Chest pain 07/25/2017  . CAD (coronary artery disease) 07/25/2017  . ESRD (end stage renal disease) ([place]) 07/25/2017  . Aortic valve sclerosis   . Essential hypertension   . Pure hypercholesterolemia   . Right hand pain 05/28/2017  . Angiodysplasia of cecum   . Benign neoplasm of cecum   . Benign neoplasm of ascending colon   . Benign neoplasm of descending colon   . CKD (chronic kidney disease) stage 5, GFR less than 15 ml/min (HCC)   . Melena   . Demand ischemia ([place])   . Lower GI bleed   . Tobacco abuse   . Angina pectoris ([place]) 10/18/2016  . ARF (acute renal failure) ([place]) 10/18/2016  . Hyperkalemia 10/18/2016  . Blood loss anemia 10/18/2016  . Elevated troponin 10/18/2016  . Atherosclerotic PVD  with intermittent claudication ([place]) 12/29/2013  . Peripheral vascular disease, unspecified ([place]) 12/09/2012    Past Surgical History:  Procedure Laterality Date  . AV FISTULA PLACEMENT Right 03/28/2017   Procedure: ARTERIOVENOUS (AV) BRACHIOCEPHALIC FISTULA CREATION;  Surgeon: Angelia Mould, MD;  Location: [facility];  Service: Vascular;  Laterality: Right;  . CARDIAC CATHETERIZATION  10/2016  . COLONOSCOPY WITH PROPOFOL N/A 10/21/2016   Procedure: COLONOSCOPY WITH PROPOFOL;  Surgeon: Gatha Mayer, MD;  Location: [facility] ENDOSCOPY;  Service: Endoscopy;  Laterality: N/A;  . CORONARY ANGIOPLASTY WITH STENT PLACEMENT  12/04/2016   "1 stent"  . CORONARY STENT INTERVENTION N/A 12/04/2016   Procedure: CORONARY STENT INTERVENTION;  Surgeon:  Wellington Hampshire, MD;  Location: [facility] CV LAB;  Service: Cardiovascular;  Laterality: N/A;  . ESOPHAGOGASTRODUODENOSCOPY N/A 10/21/2016   Procedure: ESOPHAGOGASTRODUODENOSCOPY (EGD);  Surgeon: Gatha Mayer, MD;  Location: [facility] ENDOSCOPY;  Service: Endoscopy;  Laterality: N/A;  . INSERTION OF DIALYSIS CATHETER Right 10/22/2016   Procedure: INSERTION OF TUNNELED DIALYSIS CATHETER;  Surgeon: Elam Dutch, MD;  Location: [facility];  Service: Vascular;  Laterality: Right;  . LEFT HEART CATH AND CORONARY ANGIOGRAPHY N/A 10/23/2016   Procedure: LEFT HEART CATH AND CORONARY ANGIOGRAPHY;  Surgeon: Wellington Hampshire, MD;  Location: [place] CV LAB;  Service: Cardiovascular;  Laterality: N/A;  . LIGATION OF ARTERIOVENOUS  FISTULA Right 05/12/2017   Procedure: BANDING OF RIGHT UPPER ARM ARTERIOVENOUS  FISTULA USING 6MM X 10CM GORE TEX VASCULAR GRAFT;  Surgeon: Angelia Mould, MD;  Location: [facility];  Service: Vascular;  Laterality: Right;  . LIGATION OF ARTERIOVENOUS  FISTULA Right 07/03/2017   Procedure: LIGATION OF ARTERIOVENOUS  FISTULA RIGHT ARM;  Surgeon: Angelia Mould, MD;  Location: [facility];  Service: Vascular;  Laterality: Right;  . TONSILLECTOMY          Home Medications    Prior to Admission medications   Medication Sig Start Date End Date Taking? Authorizing Provider  allopurinol (ZYLOPRIM) 100 MG tablet Take 1 tablet (100 mg total) by mouth daily. 10/26/16   Florencia Reasons, MD  amLODipine (NORVASC) 5 MG tablet Take 5 mg by mouth daily. 04/22/18   [provider]  atorvastatin (LIPITOR) 80 MG tablet Take 1 tablet (80 mg total) by mouth daily at 6 PM. 10/31/17   Theora Gianotti, NP  b complex vitamins tablet Take 1 tablet by mouth See admin instructions. Given at dialysis    [provider]  calcitRIOL (ROCALTROL) 0.25 MCG capsule Take 1 capsule (0.25 mcg total) by mouth Every Tuesday,Thursday,and Saturday with dialysis. Patient not taking: Reported on  04/28/2018 12/05/16   Leanor Kail, PA  carvedilol (COREG) 6.25 MG tablet Take 1 tablet (6.25 mg total) by mouth 2 (two) times daily with a meal. 05/14/18   Dunn, Areta Haber, PA-C  clopidogrel (PLAVIX) 75 MG tablet Take 1 tablet (75 mg total) by mouth daily. 12/04/17   Theora Gianotti, NP  ferric citrate (AURYXIA) 1 GM 210 MG(Fe) tablet Take 420 mg by mouth 3 (three) times daily with meals.     [provider]  isosorbide mononitrate (IMDUR) 30 MG 24 hr tablet Take 30 mg by mouth daily. 01/24/17   [provider]  nitroGLYCERIN (NITROSTAT) 0.4 MG SL tablet Place 1 tablet (0.4 mg total) under the tongue every 5 (five) minutes as needed for chest pain. 05/14/18   Nat Christen, MD  oxyCODONE (ROXICODONE) 5 MG immediate release tablet Take 1 tablet (5 mg  total) by mouth every 4 (four) hours as needed for severe pain. Patient not taking: Reported on 04/28/2018 07/03/17   Angelia Mould, MD  pantoprazole (PROTONIX) 40 MG tablet Take 40 mg by mouth daily. 03/30/18   [provider]  tamsulosin (FLOMAX) 0.4 MG CAPS capsule Take 0.4 mg by mouth daily. 04/26/18   [provider]    Family History Family History  Problem Relation Age of Onset  . Cancer Mother   . Cancer Father   . Heart attack Father   . Diabetes Father   . Hypertension Father   . Cancer Sister   . Cancer Brother   . Diabetes Brother   . Hypertension Brother   . Hypertension Daughter     Social History Social History   Tobacco Use  . Smoking status: Former Smoker    Packs/day: 0.50    Years: 50.00    Pack years: 25.00    Types: Cigarettes    Last attempt to quit: 10/18/2016    Years since quitting: 1.5  . Smokeless tobacco: Never Used  Substance Use Topics  . Alcohol use: No  . Drug use: No     Allergies   Patient has no known allergies.   Review of Systems Review of Systems  All other systems reviewed and are negative.    Physical Exam Updated Vital Signs BP  114/61   Pulse 64   Temp 98.4 F (36.9 C) (Oral)   Resp 20   SpO2 100%   Physical Exam Vitals signs and nursing note reviewed.  Constitutional:      Appearance: He is well-developed.  HENT:     Head: Normocephalic and atraumatic.  Eyes:     Conjunctiva/sclera: Conjunctivae normal.  Neck:     Musculoskeletal: Neck supple.  Cardiovascular:     Rate and Rhythm: Normal rate and regular rhythm.  Pulmonary:     Effort: Pulmonary effort is normal.     Breath sounds: Normal breath sounds.  Abdominal:     General: Bowel sounds are normal.     Palpations: Abdomen is soft.  Musculoskeletal: Normal range of motion.  Skin:    General: Skin is warm and dry.  Neurological:     Mental Status: He is alert and oriented to person, place, and time.  Psychiatric:        Behavior: Behavior normal.      ED Treatments / Results  Labs (all labs ordered are listed, but only abnormal results are displayed) Labs Reviewed  BASIC METABOLIC PANEL - Abnormal; Notable for the following components:      Result Value   Creatinine, Ser 5.19 (*)    Calcium 8.2 (*)    GFR calc non Af Amer 10 (*)    GFR calc Af Amer 12 (*)    All other components within normal limits  CBC - Abnormal; Notable for the following components:   RBC 3.13 (*)    Hemoglobin 10.4 (*)    HCT 33.0 (*)    MCV 105.4 (*)    Platelets 141 (*)    All other components within normal limits  I-STAT TROPONIN, ED    EKG EKG Interpretation  Date/Time:  Thursday May 14 2018 14:48:33 EST Ventricular Rate:  69 PR Interval:    QRS Duration: 114 QT Interval:  438 QTC Calculation: 470 R Axis:   60 Text Interpretation:  Sinus rhythm Ventricular premature complex Prolonged PR interval Borderline intraventricular conduction delay Abnormal R-wave progression, [name] transition Abnormal  T, consider ischemia, lateral leads Confirmed by Nat Christen [phone]) on 05/14/2018 5:44:32 PM   Radiology Dg Chest 2 View  Result Date:  05/14/2018 CLINICAL DATA:  Acute chest pain today. EXAM: CHEST - 2 VIEW COMPARISON:  08/09/2017 and prior radiographs FINDINGS: The cardiomediastinal silhouette is unremarkable. A RIGHT IJ central venous catheter is noted with tip overlying the LOWER SVC. There is no evidence of focal airspace disease, pulmonary edema, suspicious pulmonary nodule/mass, pleural effusion, or pneumothorax. No acute bony abnormalities are identified. IMPRESSION: No acute abnormality. Electronically Signed   By: Margarette Canada M.D.   On: 05/14/2018 16:24    Procedures Procedures (including critical care time)  Medications Ordered in ED Medications  sodium chloride flush (NS) 0.9 % injection 3 mL (3 mLs Intravenous Not Given 05/14/18 1513)     Initial Impression / Assessment and Plan / ED Course  I have reviewed the triage vital signs and the nursing notes.  Pertinent labs & imaging results that were available during my care of the patient were reviewed by me and considered in my medical decision making (see chart for details).        Patient presents with intermittent chest pain for several months.  EKG, chest x-ray, first troponin show no acute changes.  Creatinine elevated secondary to end-stage renal disease.  He will follow-up with cardiology.  Discharge medication nitroglycerin sublingual.  Final Clinical Impressions(s) / ED Diagnoses   Final diagnoses:  Chest pain, unspecified type  ESRD (end stage renal disease) [facility])    ED Discharge Orders         Ordered    nitroGLYCERIN (NITROSTAT) 0.4 MG SL tablet  Every 5 min PRN     05/14/18 1803           Nat Christen, MD 05/14/18 1807

## 2018-05-14 NOTE — Discharge Instructions (Addendum)
Test showed no evidence of a heart attack.  Recommend nitroglycerin tablets.  Follow-up with cardiologist.  Return if worse.

## 2018-05-14 NOTE — ED Notes (Signed)
Patient verbalizes understanding of discharge instructions. Opportunity for questioning and answers were provided. Armband removed by staff, pt discharged from ED. Pt wheeled to lobby and taken home by family

## 2018-05-14 NOTE — ED Triage Notes (Signed)
PTAR- pt coming from [facility] c/o chest pain. Pain to the left side of the sternum. He has had 324 of aspirin, 20g R hand   122/66 HR 66

## 2018-05-14 NOTE — Telephone Encounter (Signed)
Spoke with patients daughter.  She confirmed patient was taking Carvedilol 6.25MG  twice a day.  90 day supply has been sent in.   carvedilol (COREG) 6.25 MG tablet 180 tablet 3 05/14/2018    Sig - Route: Take 1 tablet (6.25 mg total) by mouth 2 (two) times daily with a meal. - Oral   Sent to pharmacy as: carvedilol (COREG) 6.25 MG tablet   E-Prescribing Status: Receipt confirmed by pharmacy (05/14/2018 2:16 PM EST)   Pharmacy   [facility], [place]

## 2018-05-16 DIAGNOSIS — N186 End stage renal disease: Secondary | ICD-10-CM | POA: Diagnosis not present

## 2018-05-16 DIAGNOSIS — I129 Hypertensive chronic kidney disease with stage 1 through stage 4 chronic kidney disease, or unspecified chronic kidney disease: Secondary | ICD-10-CM | POA: Diagnosis not present

## 2018-05-16 DIAGNOSIS — Z992 Dependence on renal dialysis: Secondary | ICD-10-CM | POA: Diagnosis not present

## 2018-05-16 DIAGNOSIS — D689 Coagulation defect, unspecified: Secondary | ICD-10-CM | POA: Diagnosis not present

## 2018-05-16 DIAGNOSIS — D631 Anemia in chronic kidney disease: Secondary | ICD-10-CM | POA: Diagnosis not present

## 2018-05-16 DIAGNOSIS — N2581 Secondary hyperparathyroidism of renal origin: Secondary | ICD-10-CM | POA: Diagnosis not present

## 2018-05-16 DIAGNOSIS — E876 Hypokalemia: Secondary | ICD-10-CM | POA: Diagnosis not present

## 2018-05-19 ENCOUNTER — Other Ambulatory Visit: Payer: Self-pay

## 2018-05-19 DIAGNOSIS — N2581 Secondary hyperparathyroidism of renal origin: Secondary | ICD-10-CM | POA: Diagnosis not present

## 2018-05-19 DIAGNOSIS — D689 Coagulation defect, unspecified: Secondary | ICD-10-CM | POA: Diagnosis not present

## 2018-05-19 DIAGNOSIS — N186 End stage renal disease: Secondary | ICD-10-CM | POA: Diagnosis not present

## 2018-05-19 DIAGNOSIS — D631 Anemia in chronic kidney disease: Secondary | ICD-10-CM | POA: Diagnosis not present

## 2018-05-19 NOTE — Patient Outreach (Signed)
[name]  05/19/2018  Roger [name] 1940/07/05 [id number]   Telephone Screen  Referral Date:04/30/2018 Referral Source:[facility] Dept. Referral Reason:"member states that he may take his meds 5 out of 7 days due to stubbornness, taste of pills, size of pills, med co pay assistance Social Security will be stopped after March" Insurance:[employer]   Multiple attempts to establish contact with patient without success. No response from letter mailed to patient. Case is being closed at this time.     Plan: RN CM will close case at this time.   Enzo Montgomery, RN,BSN,CCM [facility] Management Telephonic Care Management Coordinator Direct Phone: [phone] Toll Free: [phone] Fax: [phone]

## 2018-05-21 DIAGNOSIS — D631 Anemia in chronic kidney disease: Secondary | ICD-10-CM | POA: Diagnosis not present

## 2018-05-21 DIAGNOSIS — N186 End stage renal disease: Secondary | ICD-10-CM | POA: Diagnosis not present

## 2018-05-21 DIAGNOSIS — N2581 Secondary hyperparathyroidism of renal origin: Secondary | ICD-10-CM | POA: Diagnosis not present

## 2018-05-21 DIAGNOSIS — D689 Coagulation defect, unspecified: Secondary | ICD-10-CM | POA: Diagnosis not present

## 2018-05-23 DIAGNOSIS — N186 End stage renal disease: Secondary | ICD-10-CM | POA: Diagnosis not present

## 2018-05-23 DIAGNOSIS — D631 Anemia in chronic kidney disease: Secondary | ICD-10-CM | POA: Diagnosis not present

## 2018-05-23 DIAGNOSIS — D689 Coagulation defect, unspecified: Secondary | ICD-10-CM | POA: Diagnosis not present

## 2018-05-23 DIAGNOSIS — N2581 Secondary hyperparathyroidism of renal origin: Secondary | ICD-10-CM | POA: Diagnosis not present

## 2018-05-26 DIAGNOSIS — D631 Anemia in chronic kidney disease: Secondary | ICD-10-CM | POA: Diagnosis not present

## 2018-05-26 DIAGNOSIS — N186 End stage renal disease: Secondary | ICD-10-CM | POA: Diagnosis not present

## 2018-05-26 DIAGNOSIS — D689 Coagulation defect, unspecified: Secondary | ICD-10-CM | POA: Diagnosis not present

## 2018-05-26 DIAGNOSIS — N2581 Secondary hyperparathyroidism of renal origin: Secondary | ICD-10-CM | POA: Diagnosis not present

## 2018-05-28 DIAGNOSIS — D631 Anemia in chronic kidney disease: Secondary | ICD-10-CM | POA: Diagnosis not present

## 2018-05-28 DIAGNOSIS — N2581 Secondary hyperparathyroidism of renal origin: Secondary | ICD-10-CM | POA: Diagnosis not present

## 2018-05-28 DIAGNOSIS — N186 End stage renal disease: Secondary | ICD-10-CM | POA: Diagnosis not present

## 2018-05-28 DIAGNOSIS — D689 Coagulation defect, unspecified: Secondary | ICD-10-CM | POA: Diagnosis not present

## 2018-05-30 DIAGNOSIS — N186 End stage renal disease: Secondary | ICD-10-CM | POA: Diagnosis not present

## 2018-05-30 DIAGNOSIS — N2581 Secondary hyperparathyroidism of renal origin: Secondary | ICD-10-CM | POA: Diagnosis not present

## 2018-05-30 DIAGNOSIS — D689 Coagulation defect, unspecified: Secondary | ICD-10-CM | POA: Diagnosis not present

## 2018-05-30 DIAGNOSIS — D631 Anemia in chronic kidney disease: Secondary | ICD-10-CM | POA: Diagnosis not present

## 2018-05-31 NOTE — Progress Notes (Deleted)
Cardiology Office Note Date:  05/31/2018  Patient ID:  Roger [name] 22-Dec-1940, MRN [id number] PCP:  Lujean Amel, MD  Cardiologist:  Dr. Fletcher Anon, MD  ***refresh   Chief Complaint: ED follow up  History of Present Illness: Roger [name] is a 78 y.o. male with history of ***   Past Medical History:  Diagnosis Date  . Anemia   . Anginal pain ([place])   . Arthritis    "all over; bad in the legs" (12/04/2016)  . CAD (coronary artery disease)    a. NSTEMI in setting of anemia; b. 10/2016 MV: inflat ST dep with large, reversible ant, apical, inflat defect; c. 10/2016 Cath: LM nl, LAD 40p, 46m, D1 70, D2 80, RI min irregs, LCX 100ost, RCA 100p, 181m/d, RPDA fills via collats from LAD, RPAV small-->Initial conservative Rx in setting of GIB w/ plan for PCI LAD if H/H stable on ASA/Plavix;  d. 12/04/16 s/p PTCA and DES to mLAD.  Marland Kitchen Depression    situational   . Diastolic dysfunction    a. 10/2016 Echo: EF 55-60%, no rwma, Gr2 DD, triv MR, mildly dil LA.  Marland Kitchen ESRD (end stage renal disease) ([place])    a. 10/2016 HD catheter insterted and HD initiated; b. Pending permanent HD access folllowing Cardiac Revascularization.  . ESRD on dialysis [facility])    "[place]; [address]; TTS usually; going to [facility] in [place], [place] right now while staying w/daughter" (12/04/2016)  . GERD (gastroesophageal reflux disease)   . GIB (gastrointestinal bleeding) 10/2011   a. 10/2016 3u PRBCs - EGD/Colonoscopy: angiodysplasia w/ diverticulosis. No apparent bleeding. Polypecotmy->tubular adenomas.  . Gout   . Headache    no migraines for years  . Heart murmur   . High cholesterol   . History of blood transfusion 10/18/2016   "related to anemia" (12/04/2016)  . History of kidney stones    years ago  . Hypertension   . Iron deficiency anemia   . Myocardial infarction ([place]) 10/18/2016  . Old MI (myocardial infarction)    "found evidence of this on 10/18/2016"  . PAF (paroxysmal atrial fibrillation) ([place])   .  Peripheral vascular disease ([place])    BLE  . Prostate cancer ([place])    a. s/p seed implantation.  . Tobacco abuse     Past Surgical History:  Procedure Laterality Date  . AV FISTULA PLACEMENT Right 03/28/2017   Procedure: ARTERIOVENOUS (AV) BRACHIOCEPHALIC FISTULA CREATION;  Surgeon: Angelia Mould, MD;  Location: [place] [facility];  Service: Vascular;  Laterality: Right;  . CARDIAC CATHETERIZATION  10/2016  . COLONOSCOPY WITH PROPOFOL N/A 10/21/2016   Procedure: COLONOSCOPY WITH PROPOFOL;  Surgeon: Gatha Mayer, MD;  Location: [facility] ENDOSCOPY;  Service: Endoscopy;  Laterality: N/A;  . CORONARY ANGIOPLASTY WITH STENT PLACEMENT  12/04/2016   "1 stent"  . CORONARY STENT INTERVENTION N/A 12/04/2016   Procedure: CORONARY STENT INTERVENTION;  Surgeon: Wellington Hampshire, MD;  Location: [facility] CV LAB;  Service: Cardiovascular;  Laterality: N/A;  . ESOPHAGOGASTRODUODENOSCOPY N/A 10/21/2016   Procedure: ESOPHAGOGASTRODUODENOSCOPY (EGD);  Surgeon: Gatha Mayer, MD;  Location: [facility] ENDOSCOPY;  Service: Endoscopy;  Laterality: N/A;  . INSERTION OF DIALYSIS CATHETER Right 10/22/2016   Procedure: INSERTION OF TUNNELED DIALYSIS CATHETER;  Surgeon: Elam Dutch, MD;  Location: [facility] [place];  Service: Vascular;  Laterality: Right;  . LEFT HEART CATH AND CORONARY ANGIOGRAPHY N/A 10/23/2016   Procedure: LEFT HEART CATH AND CORONARY ANGIOGRAPHY;  Surgeon: Wellington Hampshire, MD;  Location: [facility]  INVASIVE CV LAB;  Service: Cardiovascular;  Laterality: N/A;  . LIGATION OF ARTERIOVENOUS  FISTULA Right 05/12/2017   Procedure: BANDING OF RIGHT UPPER ARM ARTERIOVENOUS  FISTULA USING 6MM X 10CM GORE TEX VASCULAR GRAFT;  Surgeon: Angelia Mould, MD;  Location: [facility];  Service: Vascular;  Laterality: Right;  . LIGATION OF ARTERIOVENOUS  FISTULA Right 07/03/2017   Procedure: LIGATION OF ARTERIOVENOUS  FISTULA RIGHT ARM;  Surgeon: Angelia Mould, MD;  Location: [facility];  Service: Vascular;  Laterality: Right;  .  TONSILLECTOMY      No outpatient medications have been marked as taking for the 06/12/18 encounter (Appointment) with Rise Mu, PA-C.    Allergies:   Patient has no known allergies.   Social History:  The patient  reports that he quit smoking about 19 months ago. His smoking use included cigarettes. He has a 25.00 pack-year smoking history. He has never used smokeless tobacco. He reports that he does not drink alcohol or use drugs.   Family History:  The patient's family history includes Cancer in his brother, father, mother, and sister; Diabetes in his brother and father; Heart attack in his father; Hypertension in his brother, daughter, and father.  ROS:   ROS   PHYSICAL EXAM: *** VS:  There were no vitals taken for this visit. BMI: There is no height or weight on file to calculate BMI.  Physical Exam   EKG:  Was ordered and interpreted by me today. Shows ***  Recent Labs: 07/26/2017: ALT 16 05/14/2018: BUN 21; Creatinine, Ser 5.19; Hemoglobin 10.4; Platelets 141; Potassium 4.1; Sodium 137  No results found for requested labs within last 8760 hours.   CrCl cannot be calculated (Unknown ideal weight.).   Wt Readings from Last 3 Encounters:  09/10/17 182 lb (82.6 kg)  08/15/17 180 lb 8 oz (81.9 kg)  08/09/17 184 lb (83.5 kg)     Other studies reviewed: Additional studies/records reviewed today include: summarized above  ASSESSMENT AND PLAN:  1. ***  Disposition: F/u with Dr. Fletcher Anon or an APP in ***.  Current medicines are reviewed at length with the patient today.  The patient did not have any concerns regarding medicines.  Signed, Christell Faith, PA-C 05/31/2018 3:05 PM     [address] [place], [place] [address] [phone]

## 2018-06-02 DIAGNOSIS — N2581 Secondary hyperparathyroidism of renal origin: Secondary | ICD-10-CM | POA: Diagnosis not present

## 2018-06-02 DIAGNOSIS — D631 Anemia in chronic kidney disease: Secondary | ICD-10-CM | POA: Diagnosis not present

## 2018-06-02 DIAGNOSIS — N186 End stage renal disease: Secondary | ICD-10-CM | POA: Diagnosis not present

## 2018-06-02 DIAGNOSIS — D689 Coagulation defect, unspecified: Secondary | ICD-10-CM | POA: Diagnosis not present

## 2018-06-04 ENCOUNTER — Telehealth: Payer: Self-pay | Admitting: Physician Assistant

## 2018-06-04 DIAGNOSIS — N2581 Secondary hyperparathyroidism of renal origin: Secondary | ICD-10-CM | POA: Diagnosis not present

## 2018-06-04 DIAGNOSIS — D631 Anemia in chronic kidney disease: Secondary | ICD-10-CM | POA: Diagnosis not present

## 2018-06-04 DIAGNOSIS — D689 Coagulation defect, unspecified: Secondary | ICD-10-CM | POA: Diagnosis not present

## 2018-06-04 DIAGNOSIS — N186 End stage renal disease: Secondary | ICD-10-CM | POA: Diagnosis not present

## 2018-06-04 NOTE — Telephone Encounter (Signed)
Call to patient to give him follow up score and prescreening for COVID 19.   No answer. LMOM

## 2018-06-04 NOTE — Telephone Encounter (Signed)
Please call to see if pt needs to keep appt for next Firday 3/27. States that pt is currently at dialysis and will be available tomorrow.

## 2018-06-06 DIAGNOSIS — D631 Anemia in chronic kidney disease: Secondary | ICD-10-CM | POA: Diagnosis not present

## 2018-06-06 DIAGNOSIS — D689 Coagulation defect, unspecified: Secondary | ICD-10-CM | POA: Diagnosis not present

## 2018-06-06 DIAGNOSIS — N2581 Secondary hyperparathyroidism of renal origin: Secondary | ICD-10-CM | POA: Diagnosis not present

## 2018-06-06 DIAGNOSIS — N186 End stage renal disease: Secondary | ICD-10-CM | POA: Diagnosis not present

## 2018-06-08 NOTE — Telephone Encounter (Signed)
   Cardiac Questionnaire:    Since your last visit or hospitalization:    1. Have you been having new or worsening chest pain? No   2. Have you been having new or worsening shortness of breath? no 3. Have you been having new or worsening leg swelling, wt gain, or increase in abdominal girth (pants fitting more tightly)? No   4. Have you had any passing out spells? No    *A YES to any of these questions would result in the appointment being kept. *If all the answers to these questions are NO, we should indicate that given the current situation regarding the worldwide coronarvirus pandemic, at the recommendation of the CDC, we are looking to limit gatherings in our waiting area, and thus will reschedule their appointment beyond four weeks from today.   _____________      Primary Cardiologist:  [name], MD   Patient contacted.  History reviewed.  No symptoms to suggest any unstable cardiac conditions.  Based on discussion, with current pandemic situation, we will be postponing this appointment for Roger [name].  If symptoms change, he has been instructed to contact our office.   Routing to C19 CANCEL pool for tracking (P CV DIV CV19 CANCEL) and assigning priority (1 = 4-6 wks, 2 = 6-12 wks, 3 = >12 wks).  Alvis Lemmings, RN  06/08/2018 1:35 PM         .

## 2018-06-09 DIAGNOSIS — N186 End stage renal disease: Secondary | ICD-10-CM | POA: Diagnosis not present

## 2018-06-09 DIAGNOSIS — D689 Coagulation defect, unspecified: Secondary | ICD-10-CM | POA: Diagnosis not present

## 2018-06-09 DIAGNOSIS — N2581 Secondary hyperparathyroidism of renal origin: Secondary | ICD-10-CM | POA: Diagnosis not present

## 2018-06-09 DIAGNOSIS — D631 Anemia in chronic kidney disease: Secondary | ICD-10-CM | POA: Diagnosis not present

## 2018-06-10 ENCOUNTER — Telehealth: Payer: Self-pay | Admitting: Physician Assistant

## 2018-06-10 NOTE — Telephone Encounter (Signed)
Error

## 2018-06-10 NOTE — Progress Notes (Signed)
Virtual Visit via Telephone Note    Evaluation Performed:  Follow-up visit  This visit type was conducted due to national recommendations for restrictions regarding the COVID-19 Pandemic (e.g. social distancing).  This format is felt to be most appropriate for this patient at this time.  All issues noted in this document were discussed and addressed.  No physical exam was performed (except for noted visual exam findings with Video Visits).  Please refer to the patient's chart (MyChart message for video visits and phone note for telephone visits) for the patient's consent to telehealth for [facility].  Date:  06/12/2018   ID:  Roger [name], DOB 09/12/1940, MRN [id number]  Patient Location:  [place] [address]   Provider location:   [facility] Office  PCP:  Lujean Amel, MD  Cardiologist:  [name], MD  Electrophysiologist:  None   Chief Complaint:  Tele-health ED follow up  History of Present Illness:    Roger [name] is a 78 y.o. male who presents via audio/video conferencing for a telehealth visit today.  He has a history of multivessel CAD status post PCI/DES to the LAD as detailed below, PAF not on full dose anticoagulation secondary to recurrent GI bleeding, chronic diastolic CHF, ESRD on HD (TTS), PAD, carotid artery disease, anemia of chronic disease, HTN, HLD, and tobacco abuse who presents for tele-health follow up of his CAD and Afib.    Patient was admitted to the hospital on 10/2016 with unstable angina in the setting of a GI bleed.  LHC was done which showed significant three-vessel CAD with CTO of the RCA with left-to-right collaterals, CTO proximal LCx which seemed to be a small vessel with left to left collaterals, and a 70% stenosis of the mid LAD.  LVEDP was moderately elevated.  There were no good targets for CABG except in the LAD territory.  Subsequently, the patient underwent staged PCI/DES to the LAD in 11/2016.  TTE  showed EF 55 to 60%, no regional wall motion abnormalities, mild focal basal hypertrophy of the septum, grade 2 diastolic dysfunction, trivial mitral regurgitation, mildly dilated left atrium, right ventricular systolic function normal, no tricuspid regurgitation.  The patient had recurrent GI bleed and dual antiplatelet therapy was interrupted.  In this setting, he has been maintained on monotherapy with Plavix without further bleeding.  He has had intermittent ED visits and hospitalizations for chest discomfort and belching which are correlated with his dialysis sessions.  With these evaluations, his troponin has been normal.  Patient was most recently admitted to [facility] from 07/25/17 through 07/27/17 for chest discomfort and belching noted during his outpatient hemodialysis session.  Cardiac enzymes were negative.  TTE on 07/25/17 showed an EF of 60 to 65%, mild LVH, no regional wall motion abnormalities, grade 1 diastolic dysfunction, concern for possible vegetation on the aortic and mitral valves, mildly dilated left atrium, normal CVP.   Initially, the patient's echocardiogram was read out as possible vegetations on the aortic and mitral valves, suspicious for endocarditis.  However upon independent cardiologist review of these images during the hospital admission it was felt the patient had significant aortic valve sclerosis without stenosis with heavily calcified aortic valve leaflets and the left coronary cusp appeared to be fixed.  This was felt to be consistent with senile calcific aortic valve disease and it was not significantly changed from prior echocardiogram.  The mitral valve did have some calcification with what appeared to be redundant chordae  tendon with some calcification.  Given that the patient was afebrile, did not have a leukocytosis, and had negative blood cultures x2 this was not felt to be bacterial endocarditis as outlined initially in the TTE report.  Patient underwent Myoview on  07/26/2017 that showed a small inferior wall infarct at the mid and basal level without ischemia, finding was consistent with prior MI, EF 55 to 65%, no ST segment deviation was noted during stress, overall this was a low risk study.  Patient returned to the [facility] ED on 08/09/17 with return of chest discomfort and belching again associated with his hemodialysis session.  Cardiac enzymes were negative x2.  Hemoglobin was stable at 11.7.  WBC was normal.  In hospital follow up in 07/2017, he reported since his HD flow rates had been decreased his symptoms improved and he had felt significantly better.   Patient was seen in the ED twice in 04/2018.  Initially, he was seen for fall versus syncope after sitting up quickly at the completion of a hemodialysis session leading to a fractured hemodialysis port.  Following this, he was started on midodrine to be used on dialysis days with no further episodes.  He was seen in the ED a second time for a several month history of intermittent chest pain. EKG showed NSR, 69 bpm, 1st degree AV block, rare PVC, borderline IVCD, inferolateral Q waves, lateral TWI. Troponin was negative x 1, HGB 10.4, PLT 141, K+ 4.1, SCr 5.19. CXR not acute. Outpatient follow up was advised.   Spoke with patient and his daughter over the phone today for follow-up.  Patient indicates he is doing reasonably well since he was last seen.  He has not had any further fall versus syncopal episodes.  Blood pressure on nondialysis days typically runs in the 409W to 119J systolic.  Following dialysis, blood pressure typically runs in the low 100s to 1 teens systolic.  He continues to note intermittent chest discomfort associated HD which has been unchanged dating back to his cath in 2018.  No further GI bleeds.  No lower extremity swelling, abdominal distention, orthopnea, PND, early satiety.  He has not missed any dialysis sessions.  The patient's daughter indicates the family is focused on ensuring  quality of life over quantity.  Labs: 04/2018 -Hgb 10.4, PLT 141, potassium 4.1 07/2017 - LFT normal 01/2017 - LDL 16  The patient does not have symptoms concerning for COVID-19 infection (fever, chills, cough, or new SHORTNESS OF BREATH).    Prior CV studies:   The following studies were reviewed today:  2D echo 07/2017: - Procedure narrative: Transthoracic echocardiography. Image   quality was suboptimal. Technically difficult study. - Left ventricle: The cavity size was normal. Wall thickness was   increased in a pattern of mild LVH. Systolic function was normal.   The estimated ejection fraction was in the range of 60% to 65%.   Wall motion was normal; there were no regional wall motion   abnormalities. Doppler parameters are consistent with abnormal   left ventricular relaxation (grade 1 diastolic dysfunction). The   E/e&' ratio is between 8-15, suggesting indeterminate LV filling   pressure. - Aortic valve: Thickening and increased echogenicity of the   leaflet tips, consistent with calcification +/- endocarditis.   There is also a mass, more clearly noted in off-axis views of the   valve - this seems to move with the leaflet and likely is a   vegetation given the clinical scenario, although there is  no   significant stenosis - which would be expected given some of the   measurements obtained. Trivial regurgitation. Further evaluation   with TEE is recommended. - Mitral valve: MAC with thickened leaflets. There is a mobile   structure associated with the subvalvular apparatus in the LV,   which may be a partially flail cord. There is a mass on the   anterior leaflet which likely represents endocarditis. Further   evaluation with TEE is recommended. There is mild regurgitation.   There was mild regurgitation. - Left atrium: The atrium was mildly dilated. - Inferior vena cava: The vessel was normal in size. The   respirophasic diameter changes were in the normal range (=  50%),   consistent with normal central venous pressure.  Impressions:  - Technically difficult study, although extensive effort to   visualize aortic and mitral valves. There are likely vegetations   of both valves given the clinical scenario, consistent with   endocarditis. LVEF 60-65%. TEE is recommended to confirm the   diagnosis. There may be a partially flail subvalvular cord of the   mitral apparatus. __________  [facility]ling 07/2017:  There was no ST segment deviation noted during stress.  No T wave inversion was noted during stress.  Findings consistent with prior myocardial infarction.  This is a low risk study.  The left ventricular ejection fraction is normal (55-65%).   Small inferior wall infarct at mid and basal level with no ischemia EF preserved at 56% __________  [facility] 10/2016:  Ost Cx to Prox Cx lesion, 100 %stenosed.  Prox RCA lesion, 100 %stenosed.  Prox LAD lesion, 40 %stenosed.  Mid LAD lesion, 70 %stenosed.  Ost 1st Diag to 1st Diag lesion, 70 %stenosed.  Ost 2nd Diag to 2nd Diag lesion, 80 %stenosed.  Mid RCA to Dist RCA lesion, 100 %stenosed.   1. Significant three-vessel coronary artery disease. Chronically occluded right coronary artery with left to right collaterals supplying the right PDA. Chronically occluded proximal left circumflex which seems to be a small vessel overall with some left to left collaterals. Most of the lateral wall seems to be supplied by a large ramus branch. The LAD has a discrete 70% stenosis in a tortuous midsegment . The coronary arteries are overall moderately calcified and extremely tortuous likely due to hypertensive heart disease.  2. Moderately elevated left ventricular end-diastolic pressure. Left ventricular angiography was not performed due to kidney disease. EF was normal by echo.  Recommendations: I don't think there woud be significant advantage from CABG given that the disease in the LAD does not seem to be  critical and the only other graftable vessel would be the right PDA. The OMs seem to be very small and diffusely diseased. The best option is probably to treat medically for now and consider mid LAD PCI in the near future (few months) depending on his clinical course and after making sure he recovers from current illnesses including GI bleed and renal failure. __________  PCI 11/2016:  Ost Cx to Prox Cx lesion, 100 %stenosed.  Prox RCA lesion, 100 %stenosed.  Prox LAD lesion, 40 %stenosed.  Ost 1st Diag to 1st Diag lesion, 70 %stenosed.  Mid RCA to Dist RCA lesion, 100 %stenosed.  Mid LAD lesion, 80 %stenosed.  Post intervention, there is a 0% residual stenosis.  A stent was successfully placed.  Ost 2nd Diag to 2nd Diag lesion, 60 %stenosed.   Successful angioplasty and drug-eluting stent placement to the mid LAD.  Recommendations: Dual  antiplatelet therapy for at least 6 months. Aggressive treatment of risk factors. The patient's dialysis schedule is Tuesdays, Thursdays and Saturdays.  The patient seems to have significant disease affecting the right common femoral artery. Recommend an outpatient lower extremity arterial Doppler. __________  Past Medical History:  Diagnosis Date   Anemia    Anginal pain ([place])    Arthritis    "all over; bad in the legs" (12/04/2016)   CAD (coronary artery disease)    a. NSTEMI in setting of anemia; b. 10/2016 MV: inflat ST dep with large, reversible ant, apical, inflat defect; c. 10/2016 Cath: LM nl, LAD 40p, 57m D1 70, D2 80, RI min irregs, LCX 100ost, RCA 100p, 1068m, RPDA fills via collats from LAD, RPAV small-->Initial conservative Rx in setting of GIB w/ plan for PCI LAD if H/H stable on ASA/Plavix;  d. 12/04/16 s/p PTCA and DES to mLAD.   Depression    situational    Diastolic dysfunction    a. 10/2016 Echo: EF 55-60%, no rwma, Gr2 DD, triv MR, mildly dil LA.   ESRD (end stage renal disease) ([facility]   a. 10/2016 HD catheter  insterted and HD initiated; b. Pending permanent HD access folllowing Cardiac Revascularization.   [facility]   "[place]; [address]; [place]TS usually; [place] [facility] [place] staying w/daughter" (12/04/2016)   GERD (gastroesophageal reflux disease)    GIB (gastrointestinal bleeding) 10/2011   a. 10/2016 3u PRBCs - EGD/Colonoscopy: angiodysplasia w/ diverticulosis. No apparent bleeding. Polypecotmy->tubular adenomas.   Gout    Headache    no migraines for years   Heart murmur    High cholesterol    History of blood transfusion 10/18/2016   "related to anemia" (12/04/2016)   History of kidney stones    years ago   Hypertension    Iron deficiency anemia    Myocardial infarction ([facility]2016   Old MI (myocardial infarction)    "found evidence of this on 10/18/2016"   PAF (paroxysmal atrial fibrillation) ([facility]   Peripheral vascular disease ([facility])    BLE   Prostate cancer ([facility]   a. s/p seed implantation.   Tobacco abuse    Past Surgical History:  Procedure Laterality Date   AV FISTULA PLACEMENT Right 03/28/2017   Procedure: ARTERIOVENOUS (AV) BRACHIOCEPHALIC FISTULA CREATION;  Surgeon: DiAngelia MouldMD;  [facility] Service: Vascular;  Laterality: Right;   CARDIAC CATHETERIZATION  10/2016   COLONOSCOPY WITH PROPOFOL N/A 10/21/2016   Procedure: COLONOSCOPY WITH PROPOFOL;  Surgeon: GeGatha MayerMD;  [facility] Service: Endoscopy;  Laterality: N/A;   CORONARY ANGIOPLASTY WITH STENT PLACEMENT  12/04/2016   "1 stent"   CORONARY STENT INTERVENTION N/A 12/04/2016   Procedure: CORONARY STENT INTERVENTION;  Surgeon: ArWellington HampshireMD;  [facility];  Service: Cardiovascular;  Laterality: N/A;   ESOPHAGOGASTRODUODENOSCOPY N/A 10/21/2016   Procedure: ESOPHAGOGASTRODUODENOSCOPY (EGD);  Surgeon: GeGatha MayerMD;  [facility];  Service: Endoscopy;  Laterality: N/A;    INSERTION OF DIALYSIS CATHETER Right 10/22/2016   Procedure: INSERTION OF TUNNELED DIALYSIS CATHETER;  Surgeon: FiElam DutchMD;  [facility];  Service: Vascular;  Laterality: Right;   LEFT HEART CATH AND CORONARY ANGIOGRAPHY N/A 10/23/2016   Procedure: LEFT HEART CATH AND CORONARY ANGIOGRAPHY;  Surgeon: ArWellington HampshireMD;  [facility];  Service: Cardiovascular;  Laterality: N/A;   LIGATION OF  ARTERIOVENOUS  FISTULA Right 05/12/2017   Procedure: BANDING OF RIGHT UPPER ARM ARTERIOVENOUS  FISTULA USING 6MM X 10CM GORE TEX VASCULAR GRAFT;  Surgeon: Angelia Mould, MD;  Location: [place];  Service: Vascular;  Laterality: Right;   LIGATION OF ARTERIOVENOUS  FISTULA Right 07/03/2017   Procedure: LIGATION OF ARTERIOVENOUS  FISTULA RIGHT ARM;  Surgeon: Angelia Mould, MD;  Location[facility] OR;  Service: Vascular;  Laterality: Right;   TONSILLECTOMY       Current Meds  Medication Sig   allopurinol (ZYLOPRIM) 100 MG tablet Take 1 tablet (100 mg total) by mouth daily.   atorvastatin (LIPITOR) 80 MG tablet Take 1 tablet (80 mg total) by mouth daily at 6 PM.   b complex vitamins tablet Take 1 tablet by mouth See admin instructions. Given at dialysis   calcitRIOL (ROCALTROL) 0.25 MCG capsule Take 1 capsule (0.25 mcg total) by mouth Every Tuesday,Thursday,and Saturday with dialysis.   carvedilol (COREG) 6.25 MG tablet Take 1 tablet (6.25 mg total) by mouth 2 (two) times daily with a meal.   clopidogrel (PLAVIX) 75 MG tablet Take 1 tablet (75 mg total) by mouth daily.   ferric citrate (AURYXIA) 1 GM 210 MG(Fe) tablet Take 420 mg by mouth 3 (three) times daily with meals.    isosorbide mononitrate (IMDUR) 30 MG 24 hr tablet Take 1 tablet (30 mg total) by mouth 2 (two) times daily.   midodrine (PROAMATINE) 10 MG tablet Take 10 mg by mouth 3 (three) times daily as needed (hemodialysis).   nitroGLYCERIN (NITROSTAT) 0.4 MG SL tablet Place 1 tablet (0.4 mg total)  under the tongue every 5 (five) minutes as needed for chest pain.   pantoprazole (PROTONIX) 40 MG tablet Take 40 mg by mouth daily.   sucralfate (CARAFATE) 1 GM/10ML suspension Take 1 g by mouth 4 (four) times daily -  with meals and at bedtime.   tamsulosin (FLOMAX) 0.4 MG CAPS capsule Take 0.4 mg by mouth daily.   [DISCONTINUED] amLODipine (NORVASC) 5 MG tablet Take 5 mg by mouth daily.   [DISCONTINUED] isosorbide mononitrate (IMDUR) 30 MG 24 hr tablet Take 30 mg by mouth 2 (two) times daily.     Allergies:   Patient has no known allergies.   Social History   Tobacco Use   Smoking status: Former Smoker    Packs/day: 0.50    Years: 50.00    Pack years: 25.00    Types: Cigarettes    Last attempt to quit: 10/18/2016    Years since quitting: 1.6   Smokeless tobacco: Never Used  Substance Use Topics   Alcohol use: No   Drug use: No     Family Hx: The patient's family history includes Cancer in his brother, father, mother, and sister; Diabetes in his brother and father; Heart attack in his father; Hypertension in his brother, daughter, and father.  ROS:   Please see the history of present illness.     All other systems reviewed and are negative.   Labs/Other Tests and Data Reviewed:    Recent Labs: 07/26/2017: ALT 16 05/14/2018: BUN 21; Creatinine, Ser 5.19; Hemoglobin 10.4; Platelets 141; Potassium 4.1; Sodium 137   Recent Lipid Panel Lab Results  Component Value Date/Time   CHOL 208 (H) 10/20/2016 02:28 AM   TRIG 225 (H) 10/20/2016 02:28 AM   HDL 23 (L) 10/20/2016 02:28 AM   CHOLHDL 9.0 10/20/2016 02:28 AM   LDLCALC 140 (H) 10/20/2016 02:28 AM    Wt Readings from Last  3 Encounters:  09/10/17 182 lb (82.6 kg)  08/15/17 180 lb 8 oz (81.9 kg)  08/09/17 184 lb (83.5 kg)     Exam:    Vital Signs:  BP 113/66    Pulse 91    Temp (!) 97.5 F (36.4 C)    Well nourished, well developed male in no acute distress.   ASSESSMENT & PLAN:    1.  CAD involving the  native coronary arteries with chronic stable angina: -Stable symptoms -Remains on monotherapy with Plavix -Increase isosorbide mononitrate to 30 mg twice daily -If needed could consider restarting amlodipine at 2.5 mg daily as outlined below for BP as well as antianginal effect -Continue Coreg and Lipitor -Aggressive risk factor modification and secondary prevention -If symptoms persist despite optimization of antianginal therapy and improvement in BP drops with hemodialysis, it remains uncertain if he would benefit from repeat cardiac catheterization given known CTO of the RCA with left-to-right collaterals, CTO of the LCx (which is a small vessel) with left to left collaterals and no good bypass targets -Not on ranolazine given end-stage renal disease on hemodialysis  2.  PAF: -No symptoms concerning for tachypalpitations -Has not been maintained on long-term full dose oral anticoagulation in the setting of recurrent GI bleed -Remains on Coreg  3.  HFpEF: -Stable -Volume managed per hemodialysis  4. Dizziness/possible syncope: -This seems to be associated with drops in blood pressure during and following hemodialysis sessions -Improved with the initiation of midodrine -Discontinue amlodipine in an effort to allow for slightly more permissive blood pressure -Perhaps with the discontinuation of amlodipine the midodrine may be able to be tapered moving forward  5.  ESRD: -Hemodialysis per nephrology  6.  Hypertension: -Blood pressure is reasonably controlled, however we will allow for slightly more permissive blood pressure in an effort to decrease his symptoms of chest discomfort and dizziness during dialysis sessions -If he notes significant increase in blood pressure with the discontinuation of amlodipine would recommend resumption of amlodipine at 2.5 mg daily -Continue carvedilol and Flomax -Imdur as above  7.  Hyperlipidemia: -LDL of 16 from 01/2017 -Recommend updating lipid  panel in follow-up pending COVID-19 pandemic -Continue Lipitor  8.  Anemia of chronic disease/recurrent GI bleed: -Most recent CBC from 04/2018 demonstrated a low, though stable hemoglobin of 10.4  9.  Carotid artery disease: -Carotid artery ultrasound from 04/2017 showed bilateral ICA 40 to 59% stenosis with follow-up being recommended in 12 months -Follow-up carotid artery ultrasound will be deferred until improvement is noted in the COVID-19 pandemic -Aggressive risk factor modification -Remains on statin as above   COVID-19 Education: The signs and symptoms of COVID-19 were discussed with the patient and how to seek care for testing (follow up with PCP or arrange E-visit).  The importance of social distancing was discussed today.  Patient Risk:   After full review of this patients clinical status, I feel that they are at least moderate risk at this time.  Time:   Today, I have spent 18 minutes with the patient with telehealth technology discussing the above.     Medication Adjustments/Labs and Tests Ordered: Current medicines are reviewed at length with the patient today.  Concerns regarding medicines are outlined above.  Tests Ordered: No orders of the defined types were placed in this encounter.  Medication Changes: Meds ordered this encounter  Medications   isosorbide mononitrate (IMDUR) 30 MG 24 hr tablet    Sig: Take 1 tablet (30 mg total) by mouth 2 (two)  times daily.    Dispense:  180 tablet    Refill:  3    Disposition:  in 6 month(s)  Signed, Christell Faith, PA-C  06/12/2018 12:38 PM    [facility]

## 2018-06-10 NOTE — Telephone Encounter (Signed)
Message received from Christell Faith, [place] asking if the patient could be scheduled for a telephone e-visit with him. His original appt was cancelled on 3/27 at 11:30 am.  I spoke with the patient and his daughter and they are willing to do this if possible. They are aware that the a consent will be sent to them via MyChart to review. They are also aware that they will need to send a message back to [place] stating that they agree/ not with the consent.  YOUR CARDIOLOGY TEAM HAS ARRANGED FOR AN E-VISIT FOR YOUR APPOINTMENT - PLEASE REVIEW IMPORTANT INFORMATION BELOW SEVERAL DAYS PRIOR TO YOUR APPOINTMENT  Due to the recent COVID-19 pandemic, we are transitioning in-person office visits to tele-medicine visits in an effort to decrease unnecessary exposure to our patients and staff. Medicare and most insurances are covering these visits without a copay needed. You will need a working phone.  If possible, we also ask that you have a blood pressure cuff and scale at home to measure your blood pressure, heart rate and weight prior to your scheduled appointment. Patients with clinical needs that need an in-person evaluation and testing will still be able to come to the office if absolutely necessary. If you have any questions, feel free to call our office.   CONSENT FOR TELE-HEALTH VISIT - PLEASE RVIEW  I hereby voluntarily request, consent and authorize [facility] and its employed or contracted physicians, physician assistants, nurse practitioners or other licensed health care professionals (the Practitioner), to provide me with telemedicine health care services (the "Services") as deemed necessary by the treating Practitioner. I acknowledge and consent to receive the Services by the Practitioner via telemedicine. I understand that the telemedicine visit will involve communicating with the Practitioner through live audiovisual communication technology and the disclosure of certain medical information by electronic  transmission. I acknowledge that I have been given the opportunity to request an in-person assessment or other available alternative prior to the telemedicine visit and am voluntarily participating in the telemedicine visit.  I understand that I have the right to withhold or withdraw my consent to the use of telemedicine in the course of my care at any time, without affecting my right to future care or treatment, and that the Practitioner or I may terminate the telemedicine visit at any time. I understand that I have the right to inspect all information obtained and/or recorded in the course of the telemedicine visit and may receive copies of available information for a reasonable fee.  I understand that some of the potential risks of receiving the Services via telemedicine include:  Marland Kitchen Delay or interruption in medical evaluation due to technological equipment failure or disruption; . Information transmitted may not be sufficient (e.g. poor resolution of images) to allow for appropriate medical decision making by the Practitioner; and/or  . In rare instances, security protocols could fail, causing a breach of personal health information.  Furthermore, I acknowledge that it is my responsibility to provide information about my medical history, conditions and care that is complete and accurate to the best of my ability. I acknowledge that Practitioner's advice, recommendations, and/or decision may be based on factors not within their control, such as incomplete or inaccurate data provided by me or distortions of diagnostic images or specimens that may result from electronic transmissions. I understand that the practice of medicine is not an exact science and that Practitioner makes no warranties or guarantees regarding treatment outcomes. I acknowledge that I will receive a copy  of this consent concurrently upon execution via email to the email address I last provided but may also request a printed copy by  calling the office of [facility].    I understand that my insurance will be billed for this visit.   I have read or had this consent read to me. . I understand the contents of this consent, which adequately explains the benefits and risks of the Services being provided via telemedicine.  . I have been provided ample opportunity to ask questions regarding this consent and the Services and have had my questions answered to my satisfaction. . I give my informed consent for the services to be provided through the use of telemedicine in my medical care  By participating in this telemedicine visit I agree to the above.

## 2018-06-11 DIAGNOSIS — N2581 Secondary hyperparathyroidism of renal origin: Secondary | ICD-10-CM | POA: Diagnosis not present

## 2018-06-11 DIAGNOSIS — N186 End stage renal disease: Secondary | ICD-10-CM | POA: Diagnosis not present

## 2018-06-11 DIAGNOSIS — D631 Anemia in chronic kidney disease: Secondary | ICD-10-CM | POA: Diagnosis not present

## 2018-06-11 DIAGNOSIS — D689 Coagulation defect, unspecified: Secondary | ICD-10-CM | POA: Diagnosis not present

## 2018-06-12 ENCOUNTER — Other Ambulatory Visit: Payer: Self-pay

## 2018-06-12 ENCOUNTER — Encounter: Payer: Self-pay | Admitting: Physician Assistant

## 2018-06-12 ENCOUNTER — Ambulatory Visit: Payer: Medicare Other | Admitting: Physician Assistant

## 2018-06-12 ENCOUNTER — Telehealth (INDEPENDENT_AMBULATORY_CARE_PROVIDER_SITE_OTHER): Payer: Medicare Other | Admitting: Physician Assistant

## 2018-06-12 VITALS — BP 113/66 | HR 91 | Temp 97.5°F

## 2018-06-12 DIAGNOSIS — I25118 Atherosclerotic heart disease of native coronary artery with other forms of angina pectoris: Secondary | ICD-10-CM

## 2018-06-12 DIAGNOSIS — I6523 Occlusion and stenosis of bilateral carotid arteries: Secondary | ICD-10-CM

## 2018-06-12 DIAGNOSIS — E785 Hyperlipidemia, unspecified: Secondary | ICD-10-CM

## 2018-06-12 DIAGNOSIS — Z992 Dependence on renal dialysis: Secondary | ICD-10-CM

## 2018-06-12 DIAGNOSIS — R42 Dizziness and giddiness: Secondary | ICD-10-CM

## 2018-06-12 DIAGNOSIS — N186 End stage renal disease: Secondary | ICD-10-CM

## 2018-06-12 DIAGNOSIS — I480 Paroxysmal atrial fibrillation: Secondary | ICD-10-CM

## 2018-06-12 DIAGNOSIS — I5032 Chronic diastolic (congestive) heart failure: Secondary | ICD-10-CM

## 2018-06-12 DIAGNOSIS — I10 Essential (primary) hypertension: Secondary | ICD-10-CM

## 2018-06-12 MED ORDER — ISOSORBIDE MONONITRATE ER 30 MG PO TB24: 30.0000 mg | ORAL_TABLET | Freq: Two times a day (BID) | ORAL | 3 refills | Status: DC

## 2018-06-12 NOTE — Patient Instructions (Signed)
It was a pleasure to speak with you on the phone today! Thank you for allowing [place] to continue taking care of your [facility] needs during this time.  Feel free to call as needed for questions and concerns related to your cardiac needs.    Medication Instructions:  Your physician has recommended you make the following change in your medication:  1- STOP Amlodipine 2- INCREASE Imdur to 1 tablet (30 mg total) twice daily  If you need a refill on your cardiac medications before your next appointment, please call your pharmacy.   Lab work: None ordered  If you have labs (blood work) drawn today and your tests are completely normal, you will receive your results only by: Marland Kitchen MyChart Message (if you have MyChart) OR . A paper copy in the mail If you have any lab test that is abnormal or we need to change your treatment, we will call you to review the results.  Testing/Procedures: Okay to hold off on carotid ultrasound until COVID restrictions are cleared.   Follow-Up: At [facility], you and your health needs are our priority.  As part of our continuing mission to provide you with exceptional heart care, we have created designated Provider Care Teams.  These Care Teams include your primary Cardiologist (physician) and Advanced Practice Providers (APPs -  Physician Assistants and Nurse Practitioners) who all work together to provide you with the care you need, when you need it. You will need a follow up appointment in 6 months.  Please call our office 2 months in advance to schedule this appointment.  You may see [name], MD or Christell Faith, PA-C.

## 2018-06-13 DIAGNOSIS — N186 End stage renal disease: Secondary | ICD-10-CM | POA: Diagnosis not present

## 2018-06-13 DIAGNOSIS — D689 Coagulation defect, unspecified: Secondary | ICD-10-CM | POA: Diagnosis not present

## 2018-06-13 DIAGNOSIS — D631 Anemia in chronic kidney disease: Secondary | ICD-10-CM | POA: Diagnosis not present

## 2018-06-13 DIAGNOSIS — N2581 Secondary hyperparathyroidism of renal origin: Secondary | ICD-10-CM | POA: Diagnosis not present

## 2018-06-15 NOTE — Telephone Encounter (Signed)
Consent obtained- patient saw Christell Faith, PA per telehealth visit on 3/27.

## 2018-06-16 DIAGNOSIS — Z992 Dependence on renal dialysis: Secondary | ICD-10-CM | POA: Diagnosis not present

## 2018-06-16 DIAGNOSIS — N186 End stage renal disease: Secondary | ICD-10-CM | POA: Diagnosis not present

## 2018-06-16 DIAGNOSIS — I129 Hypertensive chronic kidney disease with stage 1 through stage 4 chronic kidney disease, or unspecified chronic kidney disease: Secondary | ICD-10-CM | POA: Diagnosis not present

## 2018-06-16 DIAGNOSIS — D689 Coagulation defect, unspecified: Secondary | ICD-10-CM | POA: Diagnosis not present

## 2018-06-16 DIAGNOSIS — N2581 Secondary hyperparathyroidism of renal origin: Secondary | ICD-10-CM | POA: Diagnosis not present

## 2018-06-16 DIAGNOSIS — D631 Anemia in chronic kidney disease: Secondary | ICD-10-CM | POA: Diagnosis not present

## 2018-06-18 DIAGNOSIS — D689 Coagulation defect, unspecified: Secondary | ICD-10-CM | POA: Diagnosis not present

## 2018-06-18 DIAGNOSIS — N2581 Secondary hyperparathyroidism of renal origin: Secondary | ICD-10-CM | POA: Diagnosis not present

## 2018-06-18 DIAGNOSIS — N186 End stage renal disease: Secondary | ICD-10-CM | POA: Diagnosis not present

## 2018-06-20 DIAGNOSIS — N186 End stage renal disease: Secondary | ICD-10-CM | POA: Diagnosis not present

## 2018-06-20 DIAGNOSIS — D689 Coagulation defect, unspecified: Secondary | ICD-10-CM | POA: Diagnosis not present

## 2018-06-20 DIAGNOSIS — N2581 Secondary hyperparathyroidism of renal origin: Secondary | ICD-10-CM | POA: Diagnosis not present

## 2018-06-23 DIAGNOSIS — N186 End stage renal disease: Secondary | ICD-10-CM | POA: Diagnosis not present

## 2018-06-23 DIAGNOSIS — D689 Coagulation defect, unspecified: Secondary | ICD-10-CM | POA: Diagnosis not present

## 2018-06-23 DIAGNOSIS — N2581 Secondary hyperparathyroidism of renal origin: Secondary | ICD-10-CM | POA: Diagnosis not present

## 2018-06-25 DIAGNOSIS — N2581 Secondary hyperparathyroidism of renal origin: Secondary | ICD-10-CM | POA: Diagnosis not present

## 2018-06-25 DIAGNOSIS — D689 Coagulation defect, unspecified: Secondary | ICD-10-CM | POA: Diagnosis not present

## 2018-06-25 DIAGNOSIS — N186 End stage renal disease: Secondary | ICD-10-CM | POA: Diagnosis not present

## 2018-06-27 DIAGNOSIS — N2581 Secondary hyperparathyroidism of renal origin: Secondary | ICD-10-CM | POA: Diagnosis not present

## 2018-06-27 DIAGNOSIS — N186 End stage renal disease: Secondary | ICD-10-CM | POA: Diagnosis not present

## 2018-06-27 DIAGNOSIS — D689 Coagulation defect, unspecified: Secondary | ICD-10-CM | POA: Diagnosis not present

## 2018-06-30 DIAGNOSIS — D689 Coagulation defect, unspecified: Secondary | ICD-10-CM | POA: Diagnosis not present

## 2018-06-30 DIAGNOSIS — N2581 Secondary hyperparathyroidism of renal origin: Secondary | ICD-10-CM | POA: Diagnosis not present

## 2018-06-30 DIAGNOSIS — N186 End stage renal disease: Secondary | ICD-10-CM | POA: Diagnosis not present

## 2018-07-02 DIAGNOSIS — D689 Coagulation defect, unspecified: Secondary | ICD-10-CM | POA: Diagnosis not present

## 2018-07-02 DIAGNOSIS — N186 End stage renal disease: Secondary | ICD-10-CM | POA: Diagnosis not present

## 2018-07-02 DIAGNOSIS — N2581 Secondary hyperparathyroidism of renal origin: Secondary | ICD-10-CM | POA: Diagnosis not present

## 2018-07-04 DIAGNOSIS — N186 End stage renal disease: Secondary | ICD-10-CM | POA: Diagnosis not present

## 2018-07-04 DIAGNOSIS — D689 Coagulation defect, unspecified: Secondary | ICD-10-CM | POA: Diagnosis not present

## 2018-07-04 DIAGNOSIS — N2581 Secondary hyperparathyroidism of renal origin: Secondary | ICD-10-CM | POA: Diagnosis not present

## 2018-07-07 DIAGNOSIS — D689 Coagulation defect, unspecified: Secondary | ICD-10-CM | POA: Diagnosis not present

## 2018-07-07 DIAGNOSIS — N2581 Secondary hyperparathyroidism of renal origin: Secondary | ICD-10-CM | POA: Diagnosis not present

## 2018-07-07 DIAGNOSIS — N186 End stage renal disease: Secondary | ICD-10-CM | POA: Diagnosis not present

## 2018-07-08 DIAGNOSIS — R142 Eructation: Secondary | ICD-10-CM | POA: Diagnosis not present

## 2018-07-08 DIAGNOSIS — Z992 Dependence on renal dialysis: Secondary | ICD-10-CM | POA: Diagnosis not present

## 2018-07-08 DIAGNOSIS — K229 Disease of esophagus, unspecified: Secondary | ICD-10-CM | POA: Diagnosis not present

## 2018-07-08 DIAGNOSIS — N186 End stage renal disease: Secondary | ICD-10-CM | POA: Diagnosis not present

## 2018-07-08 DIAGNOSIS — K219 Gastro-esophageal reflux disease without esophagitis: Secondary | ICD-10-CM | POA: Diagnosis not present

## 2018-07-09 DIAGNOSIS — N2581 Secondary hyperparathyroidism of renal origin: Secondary | ICD-10-CM | POA: Diagnosis not present

## 2018-07-09 DIAGNOSIS — N186 End stage renal disease: Secondary | ICD-10-CM | POA: Diagnosis not present

## 2018-07-09 DIAGNOSIS — D689 Coagulation defect, unspecified: Secondary | ICD-10-CM | POA: Diagnosis not present

## 2018-07-11 DIAGNOSIS — N2581 Secondary hyperparathyroidism of renal origin: Secondary | ICD-10-CM | POA: Diagnosis not present

## 2018-07-11 DIAGNOSIS — N186 End stage renal disease: Secondary | ICD-10-CM | POA: Diagnosis not present

## 2018-07-11 DIAGNOSIS — D689 Coagulation defect, unspecified: Secondary | ICD-10-CM | POA: Diagnosis not present

## 2018-07-14 DIAGNOSIS — N2581 Secondary hyperparathyroidism of renal origin: Secondary | ICD-10-CM | POA: Diagnosis not present

## 2018-07-14 DIAGNOSIS — N186 End stage renal disease: Secondary | ICD-10-CM | POA: Diagnosis not present

## 2018-07-14 DIAGNOSIS — D689 Coagulation defect, unspecified: Secondary | ICD-10-CM | POA: Diagnosis not present

## 2018-07-16 DIAGNOSIS — I129 Hypertensive chronic kidney disease with stage 1 through stage 4 chronic kidney disease, or unspecified chronic kidney disease: Secondary | ICD-10-CM | POA: Diagnosis not present

## 2018-07-16 DIAGNOSIS — N186 End stage renal disease: Secondary | ICD-10-CM | POA: Diagnosis not present

## 2018-07-16 DIAGNOSIS — Z992 Dependence on renal dialysis: Secondary | ICD-10-CM | POA: Diagnosis not present

## 2018-07-16 DIAGNOSIS — D689 Coagulation defect, unspecified: Secondary | ICD-10-CM | POA: Diagnosis not present

## 2018-07-16 DIAGNOSIS — N2581 Secondary hyperparathyroidism of renal origin: Secondary | ICD-10-CM | POA: Diagnosis not present

## 2018-07-18 DIAGNOSIS — N2581 Secondary hyperparathyroidism of renal origin: Secondary | ICD-10-CM | POA: Diagnosis not present

## 2018-07-18 DIAGNOSIS — D509 Iron deficiency anemia, unspecified: Secondary | ICD-10-CM | POA: Diagnosis not present

## 2018-07-18 DIAGNOSIS — D631 Anemia in chronic kidney disease: Secondary | ICD-10-CM | POA: Diagnosis not present

## 2018-07-18 DIAGNOSIS — N186 End stage renal disease: Secondary | ICD-10-CM | POA: Diagnosis not present

## 2018-07-18 DIAGNOSIS — D689 Coagulation defect, unspecified: Secondary | ICD-10-CM | POA: Diagnosis not present

## 2018-07-21 DIAGNOSIS — D689 Coagulation defect, unspecified: Secondary | ICD-10-CM | POA: Diagnosis not present

## 2018-07-21 DIAGNOSIS — D509 Iron deficiency anemia, unspecified: Secondary | ICD-10-CM | POA: Diagnosis not present

## 2018-07-21 DIAGNOSIS — D631 Anemia in chronic kidney disease: Secondary | ICD-10-CM | POA: Diagnosis not present

## 2018-07-21 DIAGNOSIS — N186 End stage renal disease: Secondary | ICD-10-CM | POA: Diagnosis not present

## 2018-07-21 DIAGNOSIS — N2581 Secondary hyperparathyroidism of renal origin: Secondary | ICD-10-CM | POA: Diagnosis not present

## 2018-07-23 DIAGNOSIS — N186 End stage renal disease: Secondary | ICD-10-CM | POA: Diagnosis not present

## 2018-07-23 DIAGNOSIS — N2581 Secondary hyperparathyroidism of renal origin: Secondary | ICD-10-CM | POA: Diagnosis not present

## 2018-07-23 DIAGNOSIS — D689 Coagulation defect, unspecified: Secondary | ICD-10-CM | POA: Diagnosis not present

## 2018-07-23 DIAGNOSIS — D631 Anemia in chronic kidney disease: Secondary | ICD-10-CM | POA: Diagnosis not present

## 2018-07-23 DIAGNOSIS — D509 Iron deficiency anemia, unspecified: Secondary | ICD-10-CM | POA: Diagnosis not present

## 2018-07-25 DIAGNOSIS — D631 Anemia in chronic kidney disease: Secondary | ICD-10-CM | POA: Diagnosis not present

## 2018-07-25 DIAGNOSIS — D689 Coagulation defect, unspecified: Secondary | ICD-10-CM | POA: Diagnosis not present

## 2018-07-25 DIAGNOSIS — N2581 Secondary hyperparathyroidism of renal origin: Secondary | ICD-10-CM | POA: Diagnosis not present

## 2018-07-25 DIAGNOSIS — N186 End stage renal disease: Secondary | ICD-10-CM | POA: Diagnosis not present

## 2018-07-25 DIAGNOSIS — D509 Iron deficiency anemia, unspecified: Secondary | ICD-10-CM | POA: Diagnosis not present

## 2018-07-28 ENCOUNTER — Telehealth: Payer: Self-pay | Admitting: Cardiovascular Disease

## 2018-07-28 DIAGNOSIS — N186 End stage renal disease: Secondary | ICD-10-CM | POA: Diagnosis not present

## 2018-07-28 DIAGNOSIS — D509 Iron deficiency anemia, unspecified: Secondary | ICD-10-CM | POA: Diagnosis not present

## 2018-07-28 DIAGNOSIS — N2581 Secondary hyperparathyroidism of renal origin: Secondary | ICD-10-CM | POA: Diagnosis not present

## 2018-07-28 DIAGNOSIS — D631 Anemia in chronic kidney disease: Secondary | ICD-10-CM | POA: Diagnosis not present

## 2018-07-28 DIAGNOSIS — D689 Coagulation defect, unspecified: Secondary | ICD-10-CM | POA: Diagnosis not present

## 2018-07-28 NOTE — Telephone Encounter (Addendum)
I spoke with the patient's daughter, [name]. She states her father called her at 4:30 pm telling her she passed out at HD today. He was told her had no pulse.  Per [name], she called the HD center as she lives in [place].  She was told by staff that the patient's BP dropped to 80/54 and he possibly had no pulse- this couldn't be confirmed as the nurse caring for the patient had left for the day.  The patient did have color change, was placed in reverse trendelenburg and given NS & O2. EMS was called, but the call was cancelled as the patient's BP came up to 118/75- the patient became responsive- HD was stopped.  When the patient left HD, his BP was 116/67.  The patient's daughter did a 3 way call with the patient so I could speak with him as well.  He does not know how long he had been on HD for the day when the event occurred.  Right now he feels very tired, more so than normal.  He has HD on T/TH/Sat.  Confirmed with the patient that he is currently taking coreg 6.25 mg BID & Imdur 30 mg BID (at 8 am & 7 pm) even on his HD days. He takes midodrine 10 mg ~ 5 pm on his HD days.  SBP's at home are reported to be 100's-120 on average. Today (5/12)- 8 am- 99/62 (81)  5/11- 116/81 (am) & 158/73 (pm) 5/10- 101/64 (90) (am) & 110/69 (72) (pm)  I advised that I would speak with Dr. Fletcher Anon and call them back.  I was able to speak with Dr. Fletcher Anon regarding the above information. Orders received to: 1) hold Imdur & coreg tonight, but take midodrine then, 2) decrease coreg to 3.125 mg BID 3) decrease Imdur to 30 mg once daily 4) do not take coreg or Imdur on the mornings of HD  I called Cheryl back per the patient's request and advised her of the above recommendations from Dr. Fletcher Anon. I advised that since we are cutting Imdur to once daily, that he just take this in the PM on a regular basis. [name] verbalized understanding of the above recommendations and was agreeable.  I advised the  patient's episode today was most likely just due to his drop in BP. It is expected that he may feel fatigue after this. She was worried that he may need to go to the ER. I advised that the ER would be warranted if he has syncope again today, but otherwise he should hopefully feel better with the above medication changes.

## 2018-07-28 NOTE — Telephone Encounter (Signed)
Pt c/o Syncope: STAT if syncope occurred within 30 minutes and pt complains of lightheadedness High Priority if episode of passing out, completely, today or in last 24 hours   1. Did you pass out today? Yes   2. When is the last time you passed out?  At HD   3. Has this occurred multiple times? Yes when bp drops   4. Did you have any symptoms prior to passing out? Patient not aware of passing out but was told by hd nurse not responsive no HR found by one of the nurses  bp 80/54 gave fluids  118/65 HD stopped  Patient sent home with bp 116/67   Patient currently at home not feeling well.

## 2018-07-29 MED ORDER — CARVEDILOL 3.125 MG PO TABS: 3.1250 mg | ORAL_TABLET | Freq: Two times a day (BID) | ORAL | 1 refills | Status: DC

## 2018-07-29 MED ORDER — ISOSORBIDE MONONITRATE ER 30 MG PO TB24: ORAL_TABLET | ORAL | Status: DC

## 2018-07-30 DIAGNOSIS — D631 Anemia in chronic kidney disease: Secondary | ICD-10-CM | POA: Diagnosis not present

## 2018-07-30 DIAGNOSIS — D509 Iron deficiency anemia, unspecified: Secondary | ICD-10-CM | POA: Diagnosis not present

## 2018-07-30 DIAGNOSIS — N186 End stage renal disease: Secondary | ICD-10-CM | POA: Diagnosis not present

## 2018-07-30 DIAGNOSIS — D689 Coagulation defect, unspecified: Secondary | ICD-10-CM | POA: Diagnosis not present

## 2018-07-30 DIAGNOSIS — N2581 Secondary hyperparathyroidism of renal origin: Secondary | ICD-10-CM | POA: Diagnosis not present

## 2018-08-01 DIAGNOSIS — D631 Anemia in chronic kidney disease: Secondary | ICD-10-CM | POA: Diagnosis not present

## 2018-08-01 DIAGNOSIS — D689 Coagulation defect, unspecified: Secondary | ICD-10-CM | POA: Diagnosis not present

## 2018-08-01 DIAGNOSIS — D509 Iron deficiency anemia, unspecified: Secondary | ICD-10-CM | POA: Diagnosis not present

## 2018-08-01 DIAGNOSIS — N2581 Secondary hyperparathyroidism of renal origin: Secondary | ICD-10-CM | POA: Diagnosis not present

## 2018-08-01 DIAGNOSIS — N186 End stage renal disease: Secondary | ICD-10-CM | POA: Diagnosis not present

## 2018-08-03 ENCOUNTER — Other Ambulatory Visit: Payer: Self-pay

## 2018-08-03 MED ORDER — CLOPIDOGREL BISULFATE 75 MG PO TABS: 75.0000 mg | ORAL_TABLET | Freq: Every day | ORAL | 0 refills | Status: DC

## 2018-08-04 DIAGNOSIS — D509 Iron deficiency anemia, unspecified: Secondary | ICD-10-CM | POA: Diagnosis not present

## 2018-08-04 DIAGNOSIS — D689 Coagulation defect, unspecified: Secondary | ICD-10-CM | POA: Diagnosis not present

## 2018-08-04 DIAGNOSIS — N2581 Secondary hyperparathyroidism of renal origin: Secondary | ICD-10-CM | POA: Diagnosis not present

## 2018-08-04 DIAGNOSIS — D631 Anemia in chronic kidney disease: Secondary | ICD-10-CM | POA: Diagnosis not present

## 2018-08-04 DIAGNOSIS — N186 End stage renal disease: Secondary | ICD-10-CM | POA: Diagnosis not present

## 2018-08-06 DIAGNOSIS — D509 Iron deficiency anemia, unspecified: Secondary | ICD-10-CM | POA: Diagnosis not present

## 2018-08-06 DIAGNOSIS — D689 Coagulation defect, unspecified: Secondary | ICD-10-CM | POA: Diagnosis not present

## 2018-08-06 DIAGNOSIS — N186 End stage renal disease: Secondary | ICD-10-CM | POA: Diagnosis not present

## 2018-08-06 DIAGNOSIS — N2581 Secondary hyperparathyroidism of renal origin: Secondary | ICD-10-CM | POA: Diagnosis not present

## 2018-08-06 DIAGNOSIS — D631 Anemia in chronic kidney disease: Secondary | ICD-10-CM | POA: Diagnosis not present

## 2018-08-08 DIAGNOSIS — N2581 Secondary hyperparathyroidism of renal origin: Secondary | ICD-10-CM | POA: Diagnosis not present

## 2018-08-08 DIAGNOSIS — D509 Iron deficiency anemia, unspecified: Secondary | ICD-10-CM | POA: Diagnosis not present

## 2018-08-08 DIAGNOSIS — N186 End stage renal disease: Secondary | ICD-10-CM | POA: Diagnosis not present

## 2018-08-08 DIAGNOSIS — D689 Coagulation defect, unspecified: Secondary | ICD-10-CM | POA: Diagnosis not present

## 2018-08-08 DIAGNOSIS — D631 Anemia in chronic kidney disease: Secondary | ICD-10-CM | POA: Diagnosis not present

## 2018-08-11 DIAGNOSIS — N2581 Secondary hyperparathyroidism of renal origin: Secondary | ICD-10-CM | POA: Diagnosis not present

## 2018-08-11 DIAGNOSIS — D509 Iron deficiency anemia, unspecified: Secondary | ICD-10-CM | POA: Diagnosis not present

## 2018-08-11 DIAGNOSIS — D689 Coagulation defect, unspecified: Secondary | ICD-10-CM | POA: Diagnosis not present

## 2018-08-11 DIAGNOSIS — N186 End stage renal disease: Secondary | ICD-10-CM | POA: Diagnosis not present

## 2018-08-11 DIAGNOSIS — D631 Anemia in chronic kidney disease: Secondary | ICD-10-CM | POA: Diagnosis not present

## 2018-08-13 DIAGNOSIS — N186 End stage renal disease: Secondary | ICD-10-CM | POA: Diagnosis not present

## 2018-08-13 DIAGNOSIS — D631 Anemia in chronic kidney disease: Secondary | ICD-10-CM | POA: Diagnosis not present

## 2018-08-13 DIAGNOSIS — N2581 Secondary hyperparathyroidism of renal origin: Secondary | ICD-10-CM | POA: Diagnosis not present

## 2018-08-13 DIAGNOSIS — D689 Coagulation defect, unspecified: Secondary | ICD-10-CM | POA: Diagnosis not present

## 2018-08-13 DIAGNOSIS — D509 Iron deficiency anemia, unspecified: Secondary | ICD-10-CM | POA: Diagnosis not present

## 2018-08-15 DIAGNOSIS — D509 Iron deficiency anemia, unspecified: Secondary | ICD-10-CM | POA: Diagnosis not present

## 2018-08-15 DIAGNOSIS — D631 Anemia in chronic kidney disease: Secondary | ICD-10-CM | POA: Diagnosis not present

## 2018-08-15 DIAGNOSIS — N2581 Secondary hyperparathyroidism of renal origin: Secondary | ICD-10-CM | POA: Diagnosis not present

## 2018-08-15 DIAGNOSIS — D689 Coagulation defect, unspecified: Secondary | ICD-10-CM | POA: Diagnosis not present

## 2018-08-15 DIAGNOSIS — N186 End stage renal disease: Secondary | ICD-10-CM | POA: Diagnosis not present

## 2018-08-16 DIAGNOSIS — I129 Hypertensive chronic kidney disease with stage 1 through stage 4 chronic kidney disease, or unspecified chronic kidney disease: Secondary | ICD-10-CM | POA: Diagnosis not present

## 2018-08-16 DIAGNOSIS — Z992 Dependence on renal dialysis: Secondary | ICD-10-CM | POA: Diagnosis not present

## 2018-08-16 DIAGNOSIS — N186 End stage renal disease: Secondary | ICD-10-CM | POA: Diagnosis not present

## 2018-08-18 ENCOUNTER — Encounter: Payer: Self-pay | Admitting: Nephrology

## 2018-08-18 DIAGNOSIS — N2581 Secondary hyperparathyroidism of renal origin: Secondary | ICD-10-CM | POA: Diagnosis not present

## 2018-08-18 DIAGNOSIS — D631 Anemia in chronic kidney disease: Secondary | ICD-10-CM | POA: Diagnosis not present

## 2018-08-18 DIAGNOSIS — D509 Iron deficiency anemia, unspecified: Secondary | ICD-10-CM | POA: Diagnosis not present

## 2018-08-18 DIAGNOSIS — D689 Coagulation defect, unspecified: Secondary | ICD-10-CM | POA: Diagnosis not present

## 2018-08-18 DIAGNOSIS — N186 End stage renal disease: Secondary | ICD-10-CM | POA: Diagnosis not present

## 2018-08-20 DIAGNOSIS — N186 End stage renal disease: Secondary | ICD-10-CM | POA: Diagnosis not present

## 2018-08-20 DIAGNOSIS — N2581 Secondary hyperparathyroidism of renal origin: Secondary | ICD-10-CM | POA: Diagnosis not present

## 2018-08-20 DIAGNOSIS — D689 Coagulation defect, unspecified: Secondary | ICD-10-CM | POA: Diagnosis not present

## 2018-08-20 DIAGNOSIS — D631 Anemia in chronic kidney disease: Secondary | ICD-10-CM | POA: Diagnosis not present

## 2018-08-20 DIAGNOSIS — D509 Iron deficiency anemia, unspecified: Secondary | ICD-10-CM | POA: Diagnosis not present

## 2018-08-22 DIAGNOSIS — D689 Coagulation defect, unspecified: Secondary | ICD-10-CM | POA: Diagnosis not present

## 2018-08-22 DIAGNOSIS — D631 Anemia in chronic kidney disease: Secondary | ICD-10-CM | POA: Diagnosis not present

## 2018-08-22 DIAGNOSIS — N186 End stage renal disease: Secondary | ICD-10-CM | POA: Diagnosis not present

## 2018-08-22 DIAGNOSIS — D509 Iron deficiency anemia, unspecified: Secondary | ICD-10-CM | POA: Diagnosis not present

## 2018-08-22 DIAGNOSIS — N2581 Secondary hyperparathyroidism of renal origin: Secondary | ICD-10-CM | POA: Diagnosis not present

## 2018-08-25 DIAGNOSIS — D509 Iron deficiency anemia, unspecified: Secondary | ICD-10-CM | POA: Diagnosis not present

## 2018-08-25 DIAGNOSIS — D689 Coagulation defect, unspecified: Secondary | ICD-10-CM | POA: Diagnosis not present

## 2018-08-25 DIAGNOSIS — N2581 Secondary hyperparathyroidism of renal origin: Secondary | ICD-10-CM | POA: Diagnosis not present

## 2018-08-25 DIAGNOSIS — D631 Anemia in chronic kidney disease: Secondary | ICD-10-CM | POA: Diagnosis not present

## 2018-08-25 DIAGNOSIS — N186 End stage renal disease: Secondary | ICD-10-CM | POA: Diagnosis not present

## 2018-08-26 ENCOUNTER — Telehealth: Payer: Self-pay | Admitting: Cardiovascular Disease

## 2018-08-26 NOTE — Telephone Encounter (Signed)
I spoke with daughter. She explained that yesterday her father had another episode where is BP dropped to 74/62 during dialysis. His BP increased to 101/68 prior to leaving and when they stopped pulling off excess fluid.  She wanted to make sure she had the orders received on 5/13 were correct. We reviewed the following: "From 07/29/18: Orders received to: 1) hold Imdur & coreg tonight, but take midodrine then, 2) decrease coreg to 3.125 mg BID 3) decrease Imdur to 30 mg once daily 4) do not take coreg or Imdur on the mornings of HD"  She said she is going to check with patient and son to make sure he has been holding the coreg dose on the morning of dialysis.  She is going to call to tonight and see if her dad has been putting into his pill organizer correctly. He has dialysis Tuesday, Thursday and Saturday.  Advised that if he has been holding it to call us back in the morning and we could possibly add him on to an afternoon appointment with APP if needed. Otherwise to keep [place] updated if this happens again.

## 2018-08-26 NOTE — Telephone Encounter (Signed)
Patient daughter calling Would like to clarify medication changes Patient was at dialysis the other day and BP dropped to 74/62 He was given oxygen and fluids Would like to know if there should be any additional changes to medication Please call to discuss

## 2018-08-27 DIAGNOSIS — D689 Coagulation defect, unspecified: Secondary | ICD-10-CM | POA: Diagnosis not present

## 2018-08-27 DIAGNOSIS — D631 Anemia in chronic kidney disease: Secondary | ICD-10-CM | POA: Diagnosis not present

## 2018-08-27 DIAGNOSIS — N2581 Secondary hyperparathyroidism of renal origin: Secondary | ICD-10-CM | POA: Diagnosis not present

## 2018-08-27 DIAGNOSIS — N186 End stage renal disease: Secondary | ICD-10-CM | POA: Diagnosis not present

## 2018-08-27 DIAGNOSIS — D509 Iron deficiency anemia, unspecified: Secondary | ICD-10-CM | POA: Diagnosis not present

## 2018-08-28 ENCOUNTER — Ambulatory Visit: Payer: Medicare Other | Admitting: Internal Medicine

## 2018-08-29 DIAGNOSIS — N186 End stage renal disease: Secondary | ICD-10-CM | POA: Diagnosis not present

## 2018-08-29 DIAGNOSIS — D631 Anemia in chronic kidney disease: Secondary | ICD-10-CM | POA: Diagnosis not present

## 2018-08-29 DIAGNOSIS — D509 Iron deficiency anemia, unspecified: Secondary | ICD-10-CM | POA: Diagnosis not present

## 2018-08-29 DIAGNOSIS — D689 Coagulation defect, unspecified: Secondary | ICD-10-CM | POA: Diagnosis not present

## 2018-08-29 DIAGNOSIS — N2581 Secondary hyperparathyroidism of renal origin: Secondary | ICD-10-CM | POA: Diagnosis not present

## 2018-08-31 ENCOUNTER — Telehealth: Payer: Self-pay

## 2018-08-31 MED ORDER — ATORVASTATIN CALCIUM 80 MG PO TABS: 80.0000 mg | ORAL_TABLET | Freq: Every day | ORAL | 0 refills | Status: DC

## 2018-08-31 NOTE — Telephone Encounter (Signed)
Requested Prescriptions   Signed Prescriptions Disp Refills  . atorvastatin (LIPITOR) 80 MG tablet 90 tablet 0    Sig: Take 1 tablet (80 mg total) by mouth daily at 6 PM.    Authorizing Provider: Rise Mu    Ordering User: Raelene Bott, BRANDY L

## 2018-09-01 DIAGNOSIS — D689 Coagulation defect, unspecified: Secondary | ICD-10-CM | POA: Diagnosis not present

## 2018-09-01 DIAGNOSIS — D631 Anemia in chronic kidney disease: Secondary | ICD-10-CM | POA: Diagnosis not present

## 2018-09-01 DIAGNOSIS — N2581 Secondary hyperparathyroidism of renal origin: Secondary | ICD-10-CM | POA: Diagnosis not present

## 2018-09-01 DIAGNOSIS — N186 End stage renal disease: Secondary | ICD-10-CM | POA: Diagnosis not present

## 2018-09-01 DIAGNOSIS — D509 Iron deficiency anemia, unspecified: Secondary | ICD-10-CM | POA: Diagnosis not present

## 2018-09-03 DIAGNOSIS — D689 Coagulation defect, unspecified: Secondary | ICD-10-CM | POA: Diagnosis not present

## 2018-09-03 DIAGNOSIS — N186 End stage renal disease: Secondary | ICD-10-CM | POA: Diagnosis not present

## 2018-09-03 DIAGNOSIS — D509 Iron deficiency anemia, unspecified: Secondary | ICD-10-CM | POA: Diagnosis not present

## 2018-09-03 DIAGNOSIS — D631 Anemia in chronic kidney disease: Secondary | ICD-10-CM | POA: Diagnosis not present

## 2018-09-03 DIAGNOSIS — N2581 Secondary hyperparathyroidism of renal origin: Secondary | ICD-10-CM | POA: Diagnosis not present

## 2018-09-05 DIAGNOSIS — N186 End stage renal disease: Secondary | ICD-10-CM | POA: Diagnosis not present

## 2018-09-05 DIAGNOSIS — D631 Anemia in chronic kidney disease: Secondary | ICD-10-CM | POA: Diagnosis not present

## 2018-09-05 DIAGNOSIS — D509 Iron deficiency anemia, unspecified: Secondary | ICD-10-CM | POA: Diagnosis not present

## 2018-09-05 DIAGNOSIS — D689 Coagulation defect, unspecified: Secondary | ICD-10-CM | POA: Diagnosis not present

## 2018-09-05 DIAGNOSIS — N2581 Secondary hyperparathyroidism of renal origin: Secondary | ICD-10-CM | POA: Diagnosis not present

## 2018-09-08 DIAGNOSIS — D631 Anemia in chronic kidney disease: Secondary | ICD-10-CM | POA: Diagnosis not present

## 2018-09-08 DIAGNOSIS — D509 Iron deficiency anemia, unspecified: Secondary | ICD-10-CM | POA: Diagnosis not present

## 2018-09-08 DIAGNOSIS — N2581 Secondary hyperparathyroidism of renal origin: Secondary | ICD-10-CM | POA: Diagnosis not present

## 2018-09-08 DIAGNOSIS — N186 End stage renal disease: Secondary | ICD-10-CM | POA: Diagnosis not present

## 2018-09-08 DIAGNOSIS — D689 Coagulation defect, unspecified: Secondary | ICD-10-CM | POA: Diagnosis not present

## 2018-09-10 DIAGNOSIS — N2581 Secondary hyperparathyroidism of renal origin: Secondary | ICD-10-CM | POA: Diagnosis not present

## 2018-09-10 DIAGNOSIS — N186 End stage renal disease: Secondary | ICD-10-CM | POA: Diagnosis not present

## 2018-09-10 DIAGNOSIS — D631 Anemia in chronic kidney disease: Secondary | ICD-10-CM | POA: Diagnosis not present

## 2018-09-10 DIAGNOSIS — D689 Coagulation defect, unspecified: Secondary | ICD-10-CM | POA: Diagnosis not present

## 2018-09-10 DIAGNOSIS — D509 Iron deficiency anemia, unspecified: Secondary | ICD-10-CM | POA: Diagnosis not present

## 2018-09-12 DIAGNOSIS — D631 Anemia in chronic kidney disease: Secondary | ICD-10-CM | POA: Diagnosis not present

## 2018-09-12 DIAGNOSIS — N186 End stage renal disease: Secondary | ICD-10-CM | POA: Diagnosis not present

## 2018-09-12 DIAGNOSIS — N2581 Secondary hyperparathyroidism of renal origin: Secondary | ICD-10-CM | POA: Diagnosis not present

## 2018-09-12 DIAGNOSIS — D509 Iron deficiency anemia, unspecified: Secondary | ICD-10-CM | POA: Diagnosis not present

## 2018-09-12 DIAGNOSIS — D689 Coagulation defect, unspecified: Secondary | ICD-10-CM | POA: Diagnosis not present

## 2018-09-15 DIAGNOSIS — D631 Anemia in chronic kidney disease: Secondary | ICD-10-CM | POA: Diagnosis not present

## 2018-09-15 DIAGNOSIS — D509 Iron deficiency anemia, unspecified: Secondary | ICD-10-CM | POA: Diagnosis not present

## 2018-09-15 DIAGNOSIS — I129 Hypertensive chronic kidney disease with stage 1 through stage 4 chronic kidney disease, or unspecified chronic kidney disease: Secondary | ICD-10-CM | POA: Diagnosis not present

## 2018-09-15 DIAGNOSIS — Z992 Dependence on renal dialysis: Secondary | ICD-10-CM | POA: Diagnosis not present

## 2018-09-15 DIAGNOSIS — N2581 Secondary hyperparathyroidism of renal origin: Secondary | ICD-10-CM | POA: Diagnosis not present

## 2018-09-15 DIAGNOSIS — D689 Coagulation defect, unspecified: Secondary | ICD-10-CM | POA: Diagnosis not present

## 2018-09-15 DIAGNOSIS — N186 End stage renal disease: Secondary | ICD-10-CM | POA: Diagnosis not present

## 2018-09-17 DIAGNOSIS — D509 Iron deficiency anemia, unspecified: Secondary | ICD-10-CM | POA: Diagnosis not present

## 2018-09-17 DIAGNOSIS — D631 Anemia in chronic kidney disease: Secondary | ICD-10-CM | POA: Diagnosis not present

## 2018-09-17 DIAGNOSIS — D689 Coagulation defect, unspecified: Secondary | ICD-10-CM | POA: Diagnosis not present

## 2018-09-17 DIAGNOSIS — N186 End stage renal disease: Secondary | ICD-10-CM | POA: Diagnosis not present

## 2018-09-17 DIAGNOSIS — N2581 Secondary hyperparathyroidism of renal origin: Secondary | ICD-10-CM | POA: Diagnosis not present

## 2018-09-19 DIAGNOSIS — N186 End stage renal disease: Secondary | ICD-10-CM | POA: Diagnosis not present

## 2018-09-19 DIAGNOSIS — D631 Anemia in chronic kidney disease: Secondary | ICD-10-CM | POA: Diagnosis not present

## 2018-09-19 DIAGNOSIS — D509 Iron deficiency anemia, unspecified: Secondary | ICD-10-CM | POA: Diagnosis not present

## 2018-09-19 DIAGNOSIS — N2581 Secondary hyperparathyroidism of renal origin: Secondary | ICD-10-CM | POA: Diagnosis not present

## 2018-09-19 DIAGNOSIS — D689 Coagulation defect, unspecified: Secondary | ICD-10-CM | POA: Diagnosis not present

## 2018-09-22 DIAGNOSIS — N2581 Secondary hyperparathyroidism of renal origin: Secondary | ICD-10-CM | POA: Diagnosis not present

## 2018-09-22 DIAGNOSIS — D689 Coagulation defect, unspecified: Secondary | ICD-10-CM | POA: Diagnosis not present

## 2018-09-22 DIAGNOSIS — D509 Iron deficiency anemia, unspecified: Secondary | ICD-10-CM | POA: Diagnosis not present

## 2018-09-22 DIAGNOSIS — D631 Anemia in chronic kidney disease: Secondary | ICD-10-CM | POA: Diagnosis not present

## 2018-09-22 DIAGNOSIS — N186 End stage renal disease: Secondary | ICD-10-CM | POA: Diagnosis not present

## 2018-09-23 ENCOUNTER — Telehealth: Payer: Self-pay | Admitting: Cardiovascular Disease

## 2018-09-23 DIAGNOSIS — R112 Nausea with vomiting, unspecified: Secondary | ICD-10-CM | POA: Diagnosis not present

## 2018-09-23 DIAGNOSIS — Z992 Dependence on renal dialysis: Secondary | ICD-10-CM | POA: Diagnosis not present

## 2018-09-23 NOTE — Telephone Encounter (Signed)
Spoke with the pt daughter Roger [name]. Roger [name] sts that the pt is still hypotensive after dialysis, even after holding medications as instructed. Roger [name] also reports that the pt is having increased CP and vomiting daily. Initially pt was only having symptoms on dialysis days but it is now occurring daily. Roger [name] has f/u with the pt Pcp and GI physicians. GI has ordered additional testing but adv them to f/u with Cardiology since the CP and vomiting is not always associated with meals.  Appt scheduled with Ignacia Bayley, NP on 09/25/18 @ 10am. Roger [name] will need to accompany the pt. COVID screening done. Elisha Headland that on the day of the appt they will need to enter through the medical mall entrance and wear face mask. Adv her to also arrive 65min prior to the appt to allow for screening and time to get don to our office in time for the appt. Roger [name] verbalized understanding and voiced appreciation for the assistance.        COVID-19 Pre-Screening Questions:  . In the past 7 to 10 days have you had a cough,  shortness of breath, headache, congestion, fever (100 or greater) body aches, chills, sore throat, or sudden loss of taste or sense of smell? No . Have you been around anyone with known Covid 19. No . Have you been around anyone who is awaiting Covid 19 test results in the past 7 to 10 days? No . Have you been around anyone who has been exposed to Covid 19, or has mentioned symptoms of Covid 19 within the past 7 to 10 days? No

## 2018-09-23 NOTE — Telephone Encounter (Signed)
Patient daughter calling  States that patient has been vomiting pretty much everyday  Use to be on dialysis days but now it does not seem to be associated with that Vomiting begins usually 1 hour after eating and there is usually chest pain associated before the sickness Would like to discuss with nurse then possibly schedule an appointment to come in  Please call [name] at [phone]

## 2018-09-23 NOTE — Telephone Encounter (Signed)
Patient  Daughter returning call

## 2018-09-23 NOTE — Telephone Encounter (Signed)
Returned the call to the pt daughter [name] at the telephone number provided. [email or web address].

## 2018-09-24 DIAGNOSIS — N186 End stage renal disease: Secondary | ICD-10-CM | POA: Diagnosis not present

## 2018-09-24 DIAGNOSIS — D689 Coagulation defect, unspecified: Secondary | ICD-10-CM | POA: Diagnosis not present

## 2018-09-24 DIAGNOSIS — N2581 Secondary hyperparathyroidism of renal origin: Secondary | ICD-10-CM | POA: Diagnosis not present

## 2018-09-24 DIAGNOSIS — D631 Anemia in chronic kidney disease: Secondary | ICD-10-CM | POA: Diagnosis not present

## 2018-09-24 DIAGNOSIS — D509 Iron deficiency anemia, unspecified: Secondary | ICD-10-CM | POA: Diagnosis not present

## 2018-09-25 ENCOUNTER — Encounter: Payer: Self-pay | Admitting: Nurse Practitioner

## 2018-09-25 ENCOUNTER — Ambulatory Visit (INDEPENDENT_AMBULATORY_CARE_PROVIDER_SITE_OTHER): Payer: Medicare Other | Admitting: Nurse Practitioner

## 2018-09-25 ENCOUNTER — Other Ambulatory Visit: Payer: Self-pay

## 2018-09-25 VITALS — BP 126/62 | HR 67 | Ht 66.0 in | Wt 186.1 lb

## 2018-09-25 DIAGNOSIS — E785 Hyperlipidemia, unspecified: Secondary | ICD-10-CM | POA: Diagnosis not present

## 2018-09-25 DIAGNOSIS — I5032 Chronic diastolic (congestive) heart failure: Secondary | ICD-10-CM | POA: Diagnosis not present

## 2018-09-25 DIAGNOSIS — I6523 Occlusion and stenosis of bilateral carotid arteries: Secondary | ICD-10-CM

## 2018-09-25 DIAGNOSIS — R112 Nausea with vomiting, unspecified: Secondary | ICD-10-CM | POA: Diagnosis not present

## 2018-09-25 DIAGNOSIS — I200 Unstable angina: Secondary | ICD-10-CM

## 2018-09-25 DIAGNOSIS — I10 Essential (primary) hypertension: Secondary | ICD-10-CM

## 2018-09-25 DIAGNOSIS — N186 End stage renal disease: Secondary | ICD-10-CM

## 2018-09-25 MED ORDER — METOPROLOL SUCCINATE ER 25 MG PO TB24: ORAL_TABLET | ORAL | 6 refills | Status: DC

## 2018-09-25 NOTE — Progress Notes (Signed)
Office Visit    Patient Name: Roger [name] Date of Encounter: 09/25/2018  Primary Care Provider:  Lujean Amel, MD Primary Cardiologist:  [name], MD  Chief Complaint    78 year old male with a history of CAD status post prior drug-eluting stent placement to the LAD in 2018, paroxysmal atrial fibrillation not on anticoagulation secondary to history of GI bleeding, chronic diastolic ingestive heart failure, end-stage renal disease on hemodialysis, peripheral arterial disease, carotid arterial disease, anemia of chronic disease, hypertension, hyperlipidemia, and tobacco abuse, who presents for follow-up related to intermittent lightheadedness and chest pain.  Past Medical History    Past Medical History:  Diagnosis Date   Anemia    Anginal pain ([place])    Arthritis    "all over; bad in the legs" (12/04/2016)   CAD (coronary artery disease)    a. NSTEMI in setting of anemia; b. 10/2016 MV: inflat ST dep with large, reversible ant, apical, inflat defect; c. 10/2016 Cath: LM nl, LAD 40p, 60m, D1 70, D2 80, RI min irregs, LCX 100ost, RCA 100p, 152m/d, RPDA fills via collats from LAD, RPAV small-->Initial conservative Rx in setting of GIB w/ plan for PCI LAD if H/H stable on ASA/Plavix;  d. 12/04/16 s/p PTCA and DES to mLAD.   Depression    situational    Diastolic dysfunction    a. 10/2016 Echo: EF 55-60%, no rwma, Gr2 DD, triv MR, mildly dil LA; b. 07/2017 Echo: EF 60-65%, Gr1 DD. No rwma. AoV leaflet thickening (not felt to be SBE upon further review). Mild MR. Mildly dil LA.   ESRD on dialysis [facility])    "[place]; [address]; TTS usually; going to [facility] in [place], [place] right now while staying w/daughter" (12/04/2016)   GERD (gastroesophageal reflux disease)    GIB (gastrointestinal bleeding) 10/2011   a. 10/2016 3u PRBCs - EGD/Colonoscopy: angiodysplasia w/ diverticulosis. No apparent bleeding. Polypecotmy->tubular adenomas.   Gout    Headache    no migraines for  years   Heart murmur    High cholesterol    History of blood transfusion 10/18/2016   "related to anemia" (12/04/2016)   History of kidney stones    years ago   Hypertension    Iron deficiency anemia    Myocardial infarction ([place]) 10/18/2016   Old MI (myocardial infarction)    "found evidence of this on 10/18/2016"   PAF (paroxysmal atrial fibrillation) ([place])    Peripheral vascular disease ([facility])    BLE   Prostate cancer ([place])    a. s/p seed implantation.   Tobacco abuse    Past Surgical History:  Procedure Laterality Date   AV FISTULA PLACEMENT Right 03/28/2017   Procedure: ARTERIOVENOUS (AV) BRACHIOCEPHALIC FISTULA CREATION;  Surgeon: Angelia Mould, MD;  Location: [place];  Service: Vascular;  Laterality: Right;   CARDIAC CATHETERIZATION  10/2016   COLONOSCOPY WITH PROPOFOL N/A 10/21/2016   Procedure: COLONOSCOPY WITH PROPOFOL;  Surgeon: Gatha Mayer, MD;  Location: [place];  Service: Endoscopy;  Laterality: N/A;   CORONARY ANGIOPLASTY WITH STENT PLACEMENT  12/04/2016   "1 stent"   CORONARY STENT INTERVENTION N/A 12/04/2016   Procedure: CORONARY STENT INTERVENTION;  Surgeon: Wellington Hampshire, MD;  Location: [place] [facility];  Service: Cardiovascular;  Laterality: N/A;   ESOPHAGOGASTRODUODENOSCOPY N/A 10/21/2016   Procedure: ESOPHAGOGASTRODUODENOSCOPY (EGD);  Surgeon: Gatha Mayer, MD;  Location: [facility] END[facility]COPY;  Service: Endoscopy;  Laterality: N/A;   INSERTION OF DIALYSIS CATHETER Right 10/22/2016   Procedure:  INSERTION OF TUNNELED DIALYSIS CATHETER;  Surgeon: Elam Dutch, MD;  Location: [facility] OR;  Service: Vascular;  Laterality: Right;   LEFT HEART CATH AND CORONARY ANGIOGRAPHY N/A 10/23/2016   Procedure: LEFT HEART CATH AND CORONARY ANGIOGRAPHY;  Surgeon: Wellington Hampshire, MD;  Location: [place] [facility];  Service: Cardiovascular;  Laterality: N/A;   LIGATION OF ARTERIOVENOUS  FISTULA Right 05/12/2017   Procedure: BANDING OF RIGHT UPPER ARM  ARTERIOVENOUS  FISTULA USING 6MM X 10CM GORE TEX VASCULAR GRAFT;  Surgeon: Angelia Mould, MD;  Location: [place];  Service: Vascular;  Laterality: Right;   LIGATION OF ARTERIOVENOUS  FISTULA Right 07/03/2017   Procedure: LIGATION OF ARTERIOVENOUS  FISTULA RIGHT ARM;  Surgeon: Angelia Mould, MD;  Location: [facility] OR;  Service: Vascular;  Laterality: Right;   TONSILLECTOMY      Allergies  No Known Allergies  History of Present Illness    78 year old male with above complex past medical history including coronary artery disease, paroxysmal atrial fibrillation however he is not on anticoagulation secondary GI bleeding, chronic diastolic congestive heart failure, end-stage renal disease on hemodialysis, peripheral arterial disease, carotid arterial disease, anemia of chronic disease, hypertension, hyperlipidemia, and tobacco abuse.  In August 2018, he suffered a non-STEMI in the setting of GI bleed and anemia.  Diagnostic catheterization showed a chronic total occlusion of the right coronary artery with left to right collaterals as well as a chronic total occlusion of the left circumflex, which was a small vessel.  The LAD had a 70% stenosis in the mid portion of the vessel.  There were no good targets for bypass surgery and he subsequently underwent staged PCI and drug-eluting stent placement to the LAD in September 2018.  Transthoracic echo at the time showed normal LV function.  He later developed recurrent GI bleeding on dual antiplatelet therapy and has since been on monotherapy with Plavix only.  In May 2019, he was admitted with chest pain and ruled out.  Echo showed normal LV function and there was initially thought that patient may have vegetations on his aortic and mitral valves-suspicious for endocarditis.  However, upon further evaluation, it was felt that the valves are heavily calcified and not significantly changed from prior echocardiograms.  He had no signs or symptoms of  endocarditis either.  Stress testing in May 2019 showed inferior infarct without ischemia and he has been medically managed since.  He was last seen via telemedicine visit in March after 2 ER visits in February in the setting of lightheadedness and fall after dialysis.  He has since been on midodrineAnd at the time, was doing reasonably well.  Since then however, he and his daughter (who was present with him today), indicate that he has been having intermittent lightheadedness and also intermittent chest pain and dyspnea.  His lightheadedness can occur either on dialysis days or nondialysis days and have been associated with low blood pressures in both settings.  He has been using midodrine on dialysis days and also only taking his carvedilol after dialysis but sometimes on the following morning, his pressures are in the 80s to 90s.  Besides lightheadedness, he has been experiencing intermittent chest discomfort which is most likely to occur during dialysis and become associated with dyspnea and sometimes diaphoresis.  He does not frequently have exertional chest pain.  Over the past few months, he and his daughter have also noted frequent belching associated with nausea and vomiting that can occur on both dialysis and nondialysis days.  He is currently being evaluated by gastroenterology but as nausea may serve as an anginal equivalent, his gastroenterologist recommended he follow-up with [place].  He is currently feeling well today and denies chest pain, lightheadedness, or dyspnea, but did start belching during our visit.  He denies PND, orthopnea, syncope, edema, or early satiety.  Home Medications    Prior to Admission medications   Medication Sig Start Date End Date Taking? Authorizing Provider  allopurinol (ZYLOPRIM) 100 MG tablet Take 1 tablet (100 mg total) by mouth daily. 10/26/16  Yes Florencia Reasons, MD  atorvastatin (LIPITOR) 80 MG tablet Take 1 tablet (80 mg total) by mouth daily at 6 PM. 08/31/18  Yes  Dunn, Areta Haber, PA-C  b complex vitamins tablet Take 1 tablet by mouth See admin instructions. Given at dialysis   Yes [provider]  calcitRIOL (ROCALTROL) 0.25 MCG capsule Take 1 capsule (0.25 mcg total) by mouth Every Tuesday,Thursday,and Saturday with dialysis. 12/05/16  Yes Bhagat, Bhavinkumar, PA  carvedilol (COREG) 3.125 MG tablet Take 1 tablet (3.125 mg total) by mouth 2 (two) times daily with a meal. 07/29/18  Yes Wellington Hampshire, MD  clopidogrel (PLAVIX) 75 MG tablet Take 1 tablet (75 mg total) by mouth daily. 08/03/18  Yes Wellington Hampshire, MD  ferric citrate (AURYXIA) 1 GM 210 MG(Fe) tablet Take 420 mg by mouth 3 (three) times daily with meals.    Yes [provider]  isosorbide mononitrate (IMDUR) 30 MG 24 hr tablet Take 1 tablet (30 mg) by mouth once daily in the evening 07/29/18  Yes Wellington Hampshire, MD  midodrine (PROAMATINE) 10 MG tablet Take 10 mg by mouth 3 (three) times daily as needed (hemodialysis).   Yes [provider]  nitroGLYCERIN (NITROSTAT) 0.4 MG SL tablet Place 1 tablet (0.4 mg total) under the tongue every 5 (five) minutes as needed for chest pain. 05/14/18  Yes Nat Christen, MD  pantoprazole (PROTONIX) 40 MG tablet Take 40 mg by mouth daily. 03/30/18  Yes [provider]  sucralfate (CARAFATE) 1 GM/10ML suspension Take 1 g by mouth 4 (four) times daily -  with meals and at bedtime.   Yes [provider]  tamsulosin (FLOMAX) 0.4 MG CAPS capsule Take 0.4 mg by mouth daily. 04/26/18  Yes [provider]    Review of Systems    Intermittent lightheadedness, chest pain, dyspnea, and frequent belching with associated nausea and vomiting.  All other systems reviewed and are otherwise negative except as noted above.  Physical Exam    VS:  BP 126/62 (BP Location: Left Arm, Patient Position: Sitting, Cuff Size: Normal)    Pulse 67    Ht 5\' 6"  (1.676 m)    Wt 186 lb 1.9 oz (84.4 kg)    BMI 30.04 kg/m  , BMI Body mass index  is 30.04 kg/m. GEN: Well nourished, well developed, in no acute distress. HEENT: normal. Neck: Supple, no JVD, carotid bruits, or masses. Cardiac: RRR, 2/6 systolic ejection murmur at the upper sternal borders and also at the apex, no rubs, or gallops. No clubbing, cyanosis, edema.  Radials/DP/PT 1+ and equal bilaterally.  Respiratory:  Respirations regular and unlabored, clear to auscultation bilaterally. GI: Soft, nontender, nondistended, BS + x 4. MS: no deformity or atrophy. Skin: warm and dry, no rash. Neuro:  Strength and sensation are intact. Psych: Normal affect.  Accessory Clinical Findings    ECG personally reviewed by me today -regular sinus rhythm, 67, first-degree AV block, left atrial enlargement,  prolonged QT - no acute changes.  Lab Results  Component Value Date   WBC 5.5 05/14/2018   HGB 10.4 (L) 05/14/2018   HCT 33.0 (L) 05/14/2018   MCV 105.4 (H) 05/14/2018   PLT 141 (L) 05/14/2018   Lab Results  Component Value Date   CREATININE 5.19 (H) 05/14/2018   BUN 21 05/14/2018   NA 137 05/14/2018   K 4.1 05/14/2018   CL 103 05/14/2018   CO2 23 05/14/2018   Lab Results  Component Value Date   ALT 16 (L) 07/26/2017   AST 25 07/26/2017   ALKPHOS 78 07/26/2017   BILITOT 0.7 07/26/2017   Lab Results  Component Value Date   CHOL 208 (H) 10/20/2016   HDL 23 (L) 10/20/2016   LDLCALC 140 (H) 10/20/2016   TRIG 225 (H) 10/20/2016   CHOLHDL 9.0 10/20/2016     Assessment & Plan    1.  Unstable angina: Patient has noted some progression of chest discomfort and dyspnea that has been occurring towards the end of dialysis sessions.  He has had episodes similar to this in the past but they previously seem to settle out and have been worse over the past month or more.  He has also been having frequent belching with nausea and vomiting and his daughter is concerned that this may be an anginal equivalent.  He is status post prior LAD stenting with known occlusions of the  circumflex and RCA.  He had a nonischemic stress test in May 2019.  We discussed options for evaluation and agreed that is reasonable to pursue repeat stress testing to rule out ischemia.  He remains on statin, beta-blocker, Plavix, nitrate therapy.  No aspirin in the setting of prior history of GI bleed.  2.  Essential hypertension with orthostatic hypotension: Patient has been using midodrine on dialysis days and recently had his carvedilol dose and isosorbide doses halved because of low blood pressures and presyncope following dialysis.  This has been occurring on some nondialysis days as well and he has documented soft blood pressures on those days.  He has been taking carvedilol 3.125 mg after dialysis on Tuesdays, Thursdays, and Saturdays, while taking it twice daily on the other days of the week.  In an effort to simplify his regimen some and perhaps provide him with a little more blood pressure space, I am going to switch him from carvedilol to Toprol-XL 25 mg to be taken on nondialysis days only.  We will see how he does with this.  Given chest pain and known disease, I am reluctant to reduce his isosorbide dose.  Patient and daughter will continue to follow his blood pressure at home and recognize we may need to tweak things further as we go forward.  3.  Hyperlipidemia: He remains on statin therapy.  His lipids have been followed by his primary care provider.  4.  End-stage renal disease: On Tuesday Thursday Saturday dialysis and followed closely by nephrology.  He has been having chest pain again in the setting of dialysis and we will obtain a stress test as outlined above.  5.  Carotid arterial disease: Ultrasound in February 2019 showed bilateral 40-59% internal carotid artery stenoses with recommendation for follow-up in 12 months.  This was previously deferred secondary to COVID-19.  We can look to schedule this in the future.  Continue statin and Plavix therapy.  6.  Chronic diastolic  congestive heart failure: Volume management per nephrology.  Heart rate and blood pressure stable.  Adjusting beta-blocker to allow for a little more blood pressure room in the setting of orthostasis.  7.  Disposition: Follow-up stress testing as outlined above.  Follow-up in clinic in 1 month or sooner if necessary.  Plan to arrange outpt carotid u/s in the near future.  [name], NP 09/25/2018, 4:57 PM

## 2018-09-25 NOTE — Patient Instructions (Addendum)
Medication Instructions:  - Your physician has recommended you make the following change in your medication:   1) Stop coreg (carvedilol)  2) Start toprol (metoprolol succinate) 25 mg- take 1 tablet by mouth once daily on non-dialysis days  If you need a refill on your cardiac medications before your next appointment, please call your pharmacy.    Lab work: - none ordered  If you have labs (blood work) drawn today and your tests are completely normal, you will receive your results only by: Marland Kitchen MyChart Message (if you have MyChart) OR . A paper copy in the mail If you have any lab test that is abnormal or we need to change your treatment, we will call you to review the results.   Testing/Procedures: - Your physician has requested that you have a lexiscan myoview.   [place]  Your caregiver has ordered a Stress Test with nuclear imaging. The purpose of this test is to evaluate the blood supply to your heart muscle. This procedure is referred to as a "Non-Invasive Stress Test." This is because other than having an IV started in your vein, nothing is inserted or "invades" your body. Cardiac stress tests are done to find areas of poor blood flow to the heart by determining the extent of coronary artery disease (CAD). Some patients exercise on a treadmill, which naturally increases the blood flow to your heart, while others who are  unable to walk on a treadmill due to physical limitations have a pharmacologic/chemical stress agent called Lexiscan . This medicine will mimic walking on a treadmill by temporarily increasing your coronary blood flow.   Please note: these test may take anywhere between 2-4 hours to complete  PLEASE REPORT TO [facility] AT THE FIRST DESK WILL DIRECT YOU WHERE TO GO  Date of Procedure:_____________________________________  Arrival Time for Procedure:______________________________  Instructions regarding medication:   _x___:   Hold betablocker(s) night before procedure and morning of procedure (metoprolol succinate)   PLEASE NOTIFY THE OFFICE AT LEAST 24 HOURS IN ADVANCE IF YOU ARE UNABLE TO KEEP YOUR APPOINTMENT.  [phone] AND  PLEASE NOTIFY NUCLEAR MEDICINE AT [facility] AT LEAST 24 HOURS IN ADVANCE IF YOU ARE UNABLE TO KEEP YOUR APPOINTMENT. [phone]  How to prepare for your Myoview test:  1. Do not eat or drink after midnight 2. No caffeine for 24 hours prior to test 3. No smoking 24 hours prior to test. 4. Your medication may be taken with water.  If your doctor stopped a medication because of this test, do not take that medication. 5. Ladies, please do not wear dresses.  Skirts or pants are appropriate. Please wear a short sleeve shirt. 6. No perfume, cologne or lotion. 7. Wear comfortable walking shoes. No heels!  Follow-Up: At [facility], you and your health needs are our priority.  As part of our continuing mission to provide you with exceptional heart care, we have created designated Provider Care Teams.  These Care Teams include your primary Cardiologist (physician) and Advanced Practice Providers (APPs -  Physician Assistants and Nurse Practitioners) who all work together to provide you with the care you need, when you need it. . 1 month with Dr. Fletcher Anon  Any Other Special Instructions Will Be Listed Below (If Applicable). - N/A    Cardiac Nuclear Scan A cardiac nuclear scan is a test that measures blood flow to the heart when a person is resting and when he or she is exercising. The test  looks for problems such as:  Not enough blood reaching a portion of the heart.  The heart muscle not working normally. You may need this test if:  You have heart disease.  You have had abnormal lab results.  You have had heart surgery or a balloon procedure to open up blocked arteries (angioplasty).  You have chest pain.  You have shortness of breath. In this test, a radioactive dye (tracer) is  injected into your bloodstream. After the tracer has traveled to your heart, an imaging device is used to measure how much of the tracer is absorbed by or distributed to various areas of your heart. This procedure is usually done at a hospital and takes 2-4 hours. Tell a health care provider about:  Any allergies you have.  All medicines you are taking, including vitamins, herbs, eye drops, creams, and over-the-counter medicines.  Any problems you or family members have had with anesthetic medicines.  Any blood disorders you have.  Any surgeries you have had.  Any medical conditions you have.  Whether you are pregnant or may be pregnant. What are the risks? Generally, this is a safe procedure. However, problems may occur, including:  Serious chest pain and heart attack. This is only a risk if the stress portion of the test is done.  Rapid heartbeat.  Sensation of warmth in your chest. This usually passes quickly.  Allergic reaction to the tracer. What happens before the procedure?  Ask your health care provider about changing or stopping your regular medicines. This is especially important if you are taking diabetes medicines or blood thinners.  Follow instructions from your health care provider about eating or drinking restrictions.  Remove your jewelry on the day of the procedure. What happens during the procedure?  An IV will be inserted into one of your veins.  Your health care provider will inject a small amount of radioactive tracer through the IV.  You will wait for 20-40 minutes while the tracer travels through your bloodstream.  Your heart activity will be monitored with an electrocardiogram (ECG).  You will lie down on an exam table.  Images of your heart will be taken for about 15-20 minutes.  You may also have a stress test. For this test, one of the following may be done: ? You will exercise on a treadmill or stationary bike. While you exercise, your  heart's activity will be monitored with an ECG, and your blood pressure will be checked. ? You will be given medicines that will increase blood flow to parts of your heart. This is done if you are unable to exercise.  When blood flow to your heart has peaked, a tracer will again be injected through the IV.  After 20-40 minutes, you will get back on the exam table and have more images taken of your heart.  Depending on the type of tracer used, scans may need to be repeated 3-4 hours later.  Your IV line will be removed when the procedure is over. The procedure may vary among health care providers and hospitals. What happens after the procedure?  Unless your health care provider tells you otherwise, you may return to your normal schedule, including diet, activities, and medicines.  Unless your health care provider tells you otherwise, you may increase your fluid intake. This will help to flush the contrast dye from your body. Drink enough fluid to keep your urine pale yellow.  Ask your health care provider, or the department that is doing the  test: ? When will my results be ready? ? How will I get my results? Summary  A cardiac nuclear scan measures the blood flow to the heart when a person is resting and when he or she is exercising.  Tell your health care provider if you are pregnant.  Before the procedure, ask your health care provider about changing or stopping your regular medicines. This is especially important if you are taking diabetes medicines or blood thinners.  After the procedure, unless your health care provider tells you otherwise, increase your fluid intake. This will help flush the contrast dye from your body.  After the procedure, unless your health care provider tells you otherwise, you may return to your normal schedule, including diet, activities, and medicines. This information is not intended to replace advice given to you by your health care provider. Make sure  you discuss any questions you have with your health care provider. Document Released: 03/29/2004 Document Revised: 08/18/2017 Document Reviewed: 08/18/2017 Elsevier Patient Education  2020 [employer].

## 2018-09-26 DIAGNOSIS — D631 Anemia in chronic kidney disease: Secondary | ICD-10-CM | POA: Diagnosis not present

## 2018-09-26 DIAGNOSIS — N2581 Secondary hyperparathyroidism of renal origin: Secondary | ICD-10-CM | POA: Diagnosis not present

## 2018-09-26 DIAGNOSIS — D689 Coagulation defect, unspecified: Secondary | ICD-10-CM | POA: Diagnosis not present

## 2018-09-26 DIAGNOSIS — D509 Iron deficiency anemia, unspecified: Secondary | ICD-10-CM | POA: Diagnosis not present

## 2018-09-26 DIAGNOSIS — N186 End stage renal disease: Secondary | ICD-10-CM | POA: Diagnosis not present

## 2018-09-29 ENCOUNTER — Other Ambulatory Visit: Payer: Self-pay | Admitting: Gastroenterology

## 2018-09-29 DIAGNOSIS — N2581 Secondary hyperparathyroidism of renal origin: Secondary | ICD-10-CM | POA: Diagnosis not present

## 2018-09-29 DIAGNOSIS — N186 End stage renal disease: Secondary | ICD-10-CM | POA: Diagnosis not present

## 2018-09-29 DIAGNOSIS — R112 Nausea with vomiting, unspecified: Secondary | ICD-10-CM

## 2018-09-29 DIAGNOSIS — D509 Iron deficiency anemia, unspecified: Secondary | ICD-10-CM | POA: Diagnosis not present

## 2018-09-29 DIAGNOSIS — D689 Coagulation defect, unspecified: Secondary | ICD-10-CM | POA: Diagnosis not present

## 2018-09-29 DIAGNOSIS — D631 Anemia in chronic kidney disease: Secondary | ICD-10-CM | POA: Diagnosis not present

## 2018-10-01 DIAGNOSIS — N186 End stage renal disease: Secondary | ICD-10-CM | POA: Diagnosis not present

## 2018-10-01 DIAGNOSIS — D689 Coagulation defect, unspecified: Secondary | ICD-10-CM | POA: Diagnosis not present

## 2018-10-01 DIAGNOSIS — D509 Iron deficiency anemia, unspecified: Secondary | ICD-10-CM | POA: Diagnosis not present

## 2018-10-01 DIAGNOSIS — N2581 Secondary hyperparathyroidism of renal origin: Secondary | ICD-10-CM | POA: Diagnosis not present

## 2018-10-01 DIAGNOSIS — D631 Anemia in chronic kidney disease: Secondary | ICD-10-CM | POA: Diagnosis not present

## 2018-10-02 ENCOUNTER — Other Ambulatory Visit: Payer: Self-pay

## 2018-10-02 ENCOUNTER — Ambulatory Visit
Admission: RE | Admit: 2018-10-02 | Discharge: 2018-10-02 | Disposition: A | Discharge status: Home / Self Care | Payer: Medicare Other | Source: Ambulatory Visit | Attending: Nurse Practitioner | Admitting: Nurse Practitioner

## 2018-10-02 DIAGNOSIS — I200 Unstable angina: Secondary | ICD-10-CM | POA: Diagnosis not present

## 2018-10-02 LAB — NM MYOCAR MULTI W/SPECT W/WALL MOTION / EF
Estimated workload: 1 METS
Exercise duration (min): 0 min
Exercise duration (sec): 0 s
LV dias vol: 74 mL (ref 62–150)
LV sys vol: 20 mL
MPHR: 145 {beats}/min
Peak HR: 76 {beats}/min
Percent HR: 52 %
Rest HR: 57 {beats}/min
SDS: 2
SRS: 14
SSS: 7
TID: 0.67

## 2018-10-02 MED ORDER — TECHNETIUM TC 99M TETROFOSMIN IV KIT
30.0000 | PACK | Freq: Once | INTRAVENOUS | Status: AC | PRN
  Administered 2018-10-02: 11:00:00 31.89 via INTRAVENOUS

## 2018-10-02 MED ORDER — TECHNETIUM TC 99M TETROFOSMIN IV KIT
10.8800 | PACK | Freq: Once | INTRAVENOUS | Status: AC | PRN
  Administered 2018-10-02: 09:00:00 10.88 via INTRAVENOUS

## 2018-10-02 MED ORDER — REGADENOSON 0.4 MG/5ML IV SOLN
0.4000 mg | Freq: Once | INTRAVENOUS | Status: AC
  Administered 2018-10-02: 11:00:00 0.4 mg via INTRAVENOUS

## 2018-10-03 DIAGNOSIS — N186 End stage renal disease: Secondary | ICD-10-CM | POA: Diagnosis not present

## 2018-10-03 DIAGNOSIS — D689 Coagulation defect, unspecified: Secondary | ICD-10-CM | POA: Diagnosis not present

## 2018-10-03 DIAGNOSIS — D509 Iron deficiency anemia, unspecified: Secondary | ICD-10-CM | POA: Diagnosis not present

## 2018-10-03 DIAGNOSIS — N2581 Secondary hyperparathyroidism of renal origin: Secondary | ICD-10-CM | POA: Diagnosis not present

## 2018-10-03 DIAGNOSIS — D631 Anemia in chronic kidney disease: Secondary | ICD-10-CM | POA: Diagnosis not present

## 2018-10-06 DIAGNOSIS — N186 End stage renal disease: Secondary | ICD-10-CM | POA: Diagnosis not present

## 2018-10-06 DIAGNOSIS — N2581 Secondary hyperparathyroidism of renal origin: Secondary | ICD-10-CM | POA: Diagnosis not present

## 2018-10-06 DIAGNOSIS — D631 Anemia in chronic kidney disease: Secondary | ICD-10-CM | POA: Diagnosis not present

## 2018-10-06 DIAGNOSIS — D509 Iron deficiency anemia, unspecified: Secondary | ICD-10-CM | POA: Diagnosis not present

## 2018-10-06 DIAGNOSIS — D689 Coagulation defect, unspecified: Secondary | ICD-10-CM | POA: Diagnosis not present

## 2018-10-07 ENCOUNTER — Ambulatory Visit
Admission: RE | Admit: 2018-10-07 | Discharge: 2018-10-07 | Disposition: A | Discharge status: Home / Self Care | Payer: Medicare Other | Source: Ambulatory Visit | Attending: Gastroenterology | Admitting: Gastroenterology

## 2018-10-07 DIAGNOSIS — N185 Chronic kidney disease, stage 5: Secondary | ICD-10-CM | POA: Diagnosis not present

## 2018-10-07 DIAGNOSIS — R112 Nausea with vomiting, unspecified: Secondary | ICD-10-CM

## 2018-10-07 DIAGNOSIS — R161 Splenomegaly, not elsewhere classified: Secondary | ICD-10-CM | POA: Diagnosis not present

## 2018-10-08 DIAGNOSIS — D509 Iron deficiency anemia, unspecified: Secondary | ICD-10-CM | POA: Diagnosis not present

## 2018-10-08 DIAGNOSIS — N186 End stage renal disease: Secondary | ICD-10-CM | POA: Diagnosis not present

## 2018-10-08 DIAGNOSIS — D631 Anemia in chronic kidney disease: Secondary | ICD-10-CM | POA: Diagnosis not present

## 2018-10-08 DIAGNOSIS — D689 Coagulation defect, unspecified: Secondary | ICD-10-CM | POA: Diagnosis not present

## 2018-10-08 DIAGNOSIS — N2581 Secondary hyperparathyroidism of renal origin: Secondary | ICD-10-CM | POA: Diagnosis not present

## 2018-10-10 DIAGNOSIS — D631 Anemia in chronic kidney disease: Secondary | ICD-10-CM | POA: Diagnosis not present

## 2018-10-10 DIAGNOSIS — N186 End stage renal disease: Secondary | ICD-10-CM | POA: Diagnosis not present

## 2018-10-10 DIAGNOSIS — D689 Coagulation defect, unspecified: Secondary | ICD-10-CM | POA: Diagnosis not present

## 2018-10-10 DIAGNOSIS — N2581 Secondary hyperparathyroidism of renal origin: Secondary | ICD-10-CM | POA: Diagnosis not present

## 2018-10-10 DIAGNOSIS — D509 Iron deficiency anemia, unspecified: Secondary | ICD-10-CM | POA: Diagnosis not present

## 2018-10-13 DIAGNOSIS — N2581 Secondary hyperparathyroidism of renal origin: Secondary | ICD-10-CM | POA: Diagnosis not present

## 2018-10-13 DIAGNOSIS — D631 Anemia in chronic kidney disease: Secondary | ICD-10-CM | POA: Diagnosis not present

## 2018-10-13 DIAGNOSIS — D689 Coagulation defect, unspecified: Secondary | ICD-10-CM | POA: Diagnosis not present

## 2018-10-13 DIAGNOSIS — D509 Iron deficiency anemia, unspecified: Secondary | ICD-10-CM | POA: Diagnosis not present

## 2018-10-13 DIAGNOSIS — N186 End stage renal disease: Secondary | ICD-10-CM | POA: Diagnosis not present

## 2018-10-15 DIAGNOSIS — D509 Iron deficiency anemia, unspecified: Secondary | ICD-10-CM | POA: Diagnosis not present

## 2018-10-15 DIAGNOSIS — D631 Anemia in chronic kidney disease: Secondary | ICD-10-CM | POA: Diagnosis not present

## 2018-10-15 DIAGNOSIS — N2581 Secondary hyperparathyroidism of renal origin: Secondary | ICD-10-CM | POA: Diagnosis not present

## 2018-10-15 DIAGNOSIS — D689 Coagulation defect, unspecified: Secondary | ICD-10-CM | POA: Diagnosis not present

## 2018-10-15 DIAGNOSIS — N186 End stage renal disease: Secondary | ICD-10-CM | POA: Diagnosis not present

## 2018-10-16 DIAGNOSIS — Z992 Dependence on renal dialysis: Secondary | ICD-10-CM | POA: Diagnosis not present

## 2018-10-16 DIAGNOSIS — I129 Hypertensive chronic kidney disease with stage 1 through stage 4 chronic kidney disease, or unspecified chronic kidney disease: Secondary | ICD-10-CM | POA: Diagnosis not present

## 2018-10-16 DIAGNOSIS — N186 End stage renal disease: Secondary | ICD-10-CM | POA: Diagnosis not present

## 2018-10-17 DIAGNOSIS — Z992 Dependence on renal dialysis: Secondary | ICD-10-CM | POA: Diagnosis not present

## 2018-10-17 DIAGNOSIS — D689 Coagulation defect, unspecified: Secondary | ICD-10-CM | POA: Diagnosis not present

## 2018-10-17 DIAGNOSIS — N186 End stage renal disease: Secondary | ICD-10-CM | POA: Diagnosis not present

## 2018-10-17 DIAGNOSIS — N2581 Secondary hyperparathyroidism of renal origin: Secondary | ICD-10-CM | POA: Diagnosis not present

## 2018-10-17 DIAGNOSIS — D631 Anemia in chronic kidney disease: Secondary | ICD-10-CM | POA: Diagnosis not present

## 2018-10-20 DIAGNOSIS — D689 Coagulation defect, unspecified: Secondary | ICD-10-CM | POA: Diagnosis not present

## 2018-10-20 DIAGNOSIS — D631 Anemia in chronic kidney disease: Secondary | ICD-10-CM | POA: Diagnosis not present

## 2018-10-20 DIAGNOSIS — N2581 Secondary hyperparathyroidism of renal origin: Secondary | ICD-10-CM | POA: Diagnosis not present

## 2018-10-20 DIAGNOSIS — Z992 Dependence on renal dialysis: Secondary | ICD-10-CM | POA: Diagnosis not present

## 2018-10-20 DIAGNOSIS — N186 End stage renal disease: Secondary | ICD-10-CM | POA: Diagnosis not present

## 2018-10-22 DIAGNOSIS — D631 Anemia in chronic kidney disease: Secondary | ICD-10-CM | POA: Diagnosis not present

## 2018-10-22 DIAGNOSIS — N2581 Secondary hyperparathyroidism of renal origin: Secondary | ICD-10-CM | POA: Diagnosis not present

## 2018-10-22 DIAGNOSIS — N186 End stage renal disease: Secondary | ICD-10-CM | POA: Diagnosis not present

## 2018-10-22 DIAGNOSIS — D689 Coagulation defect, unspecified: Secondary | ICD-10-CM | POA: Diagnosis not present

## 2018-10-22 DIAGNOSIS — Z992 Dependence on renal dialysis: Secondary | ICD-10-CM | POA: Diagnosis not present

## 2018-10-23 ENCOUNTER — Ambulatory Visit (INDEPENDENT_AMBULATORY_CARE_PROVIDER_SITE_OTHER): Payer: Medicare Other | Admitting: Cardiovascular Disease

## 2018-10-23 ENCOUNTER — Other Ambulatory Visit: Payer: Self-pay

## 2018-10-23 ENCOUNTER — Encounter: Payer: Self-pay | Admitting: Cardiovascular Disease

## 2018-10-23 VITALS — BP 127/66 | HR 59 | Ht 66.0 in | Wt 187.5 lb

## 2018-10-23 DIAGNOSIS — I739 Peripheral vascular disease, unspecified: Secondary | ICD-10-CM | POA: Diagnosis not present

## 2018-10-23 DIAGNOSIS — I10 Essential (primary) hypertension: Secondary | ICD-10-CM

## 2018-10-23 DIAGNOSIS — E785 Hyperlipidemia, unspecified: Secondary | ICD-10-CM | POA: Diagnosis not present

## 2018-10-23 DIAGNOSIS — I25118 Atherosclerotic heart disease of native coronary artery with other forms of angina pectoris: Secondary | ICD-10-CM | POA: Diagnosis not present

## 2018-10-23 MED ORDER — MIDODRINE HCL 10 MG PO TABS: 10.0000 mg | ORAL_TABLET | Freq: Two times a day (BID) | ORAL | 3 refills | Status: DC | PRN

## 2018-10-23 NOTE — Patient Instructions (Signed)
Medication Instructions:  Your physician has recommended you make the following change in your medication:  1. ONLY take Midodrine 10 mg twice a day with HEMODIALYSIS  If you need a refill on your cardiac medications before your next appointment, please call your pharmacy.   Lab work: None If you have labs (blood work) drawn today and your tests are completely normal, you will receive your results only by: Marland Kitchen MyChart Message (if you have MyChart) OR . A paper copy in the mail If you have any lab test that is abnormal or we need to change your treatment, we will call you to review the results.  Testing/Procedures: None  Follow-Up: At [facility], you and your health needs are our priority.  As part of our continuing mission to provide you with exceptional heart care, we have created designated Provider Care Teams.  These Care Teams include your primary Cardiologist (physician) and Advanced Practice Providers (APPs -  Physician Assistants and Nurse Practitioners) who all work together to provide you with the care you need, when you need it. You will need a follow up appointment in 4 months.  Please call our office 2 months in advance to schedule this appointment.  You may see [name], MD or one of the following Advanced Practice Providers on your designated Care Team:   [name], NP Christell Faith, PA-C . Marrianne Mood, PA-C  Any Other Special Instructions Will Be Listed Below (If Applicable).

## 2018-10-23 NOTE — Progress Notes (Signed)
Cardiology Office Note   Date:  10/23/2018   ID:  JONNATHAN BIRMAN, DOB [birth year], MRN [id number]  PCP:  Lujean Amel, MD  Cardiologist:   [name], MD   Chief Complaint  Patient presents with  . other    1 month f/u c/o low BP. Meds reviewed verbally with pt.      History of Present Illness: Roger [name] is a 78 y.o. male who presents for a follow-up visit regarding coronary artery disease. He has extensive medical problems that include peripheral arterial disease, tobacco use, end-stage renal disease on hemodialysis, anemia, paroxysmal atrial fibrillation not on anticoagulation, recurrent GI bleed and coronary artery disease. He had unstable angina in August, 2018 in the setting of GI bleed. Cardiac catheterization was done which showed significant three-vessel coronary artery disease with chronically occluded right coronary artery with left-to-right collaterals, chronically occluded proximal left circumflex which seemed to be a small vessel with left to left collaterals and 70% stenosis in the mid LAD. LVEDP was moderately elevated. EF was normal by echo.  No good targets for CABG except in the LAD territory.  The patient underwent staged LAD PCI with drug-eluting stent placement in September.   He was noted to have PAD affecting his right common femoral artery. He had GI bleed on dual antiplatelet therapy and was treated with Plavix monotherapy.  Stress testing in May 2019 showed inferior infarct without ischemia. He was seen by Ignacia Bayley last month.  The patient has been following up for lightheadedness and low blood pressure that required starting midodrine.  He also reported intermittent chest pain during dialysis.  He underwent a Lexiscan Myoview which showed inferior wall hypokinesis with no significant ischemia.  EF was 54%.  He was switched from carvedilol to Toprol.  He reports improvement in chest pain overall since adjustment of his medications.  However, his blood  pressure continues to drop during dialysis.   Past Medical History:  Diagnosis Date  . Anemia   . Anginal pain ([place])   . Arthritis    "all over; bad in the legs" (12/04/2016)  . CAD (coronary artery disease)    a. NSTEMI in setting of anemia; b. 10/2016 MV: inflat ST dep with large, reversible ant, apical, inflat defect; c. 10/2016 Cath: LM nl, LAD 40p, 69m, D1 70, D2 80, RI min irregs, LCX 100ost, RCA 100p, 14m/d, RPDA fills via collats from LAD, RPAV small-->Initial conservative Rx in setting of GIB w/ plan for PCI LAD if H/H stable on ASA/Plavix;  d. 12/04/16 s/p PTCA and DES to mLAD.  Marland Kitchen Depression    situational   . Diastolic dysfunction    a. 10/2016 Echo: EF 55-60%, no rwma, Gr2 DD, triv MR, mildly dil LA; b. 07/2017 Echo: EF 60-65%, Gr1 DD. No rwma. AoV leaflet thickening (not felt to be SBE upon further review). Mild MR. Mildly dil LA.  Marland Kitchen ESRD on dialysis [facility])    "[place]; [address]; TTS usually; going to [facility] in [place], [place] right now while staying w/daughter" (12/04/2016)  . GERD (gastroesophageal reflux disease)   . GIB (gastrointestinal bleeding) 10/2011   a. 10/2016 3u PRBCs - EGD/Colonoscopy: angiodysplasia w/ diverticulosis. No apparent bleeding. Polypecotmy->tubular adenomas.  . Gout   . Headache    no migraines for years  . Heart murmur   . High cholesterol   . History of blood transfusion 10/18/2016   "related to anemia" (12/04/2016)  . History of kidney stones    years  ago  . Hypertension   . Iron deficiency anemia   . Myocardial infarction ([place]) 10/18/2016  . Old MI (myocardial infarction)    "found evidence of this on 10/18/2016"  . PAF (paroxysmal atrial fibrillation) ([place])   . Peripheral vascular disease ([place])    BLE  . Prostate cancer ([place])    a. s/p seed implantation.  . Tobacco abuse     Past Surgical History:  Procedure Laterality Date  . AV FISTULA PLACEMENT Right 03/28/2017   Procedure: ARTERIOVENOUS (AV) BRACHIOCEPHALIC FISTULA CREATION;   Surgeon: Angelia Mould, MD;  Location: [place];  Service: Vascular;  Laterality: Right;  . CARDIAC CATHETERIZATION  10/2016  . COLONOSCOPY WITH PROPOFOL N/A 10/21/2016   Procedure: COLONOSCOPY WITH PROPOFOL;  Surgeon: Gatha Mayer, MD;  Location[facility] ENDOSCOPY;  Service: Endoscopy;  Laterality: N/A;  . CORONARY ANGIOPLASTY WITH STENT PLACEMENT  12/04/2016   "1 stent"  . CORONARY STENT INTERVENTION N/A 12/04/2016   Procedure: CORONARY STENT INTERVENTION;  Surgeon: Wellington Hampshire, MD;  Location: [place] CV LAB;  Service: Cardiovascular;  Laterality: N/A;  . ESOPHAGOGASTRODUODENOSCOPY N/A 10/21/2016   Procedure: ESOPHAGOGASTRODUODENOSCOPY (EGD);  Surgeon: Gatha Mayer, MD;  Location: [facility] ENDOSCOPY;  Service: Endoscopy;  Laterality: N/A;  . INSERTION OF DIALYSIS CATHETER Right 10/22/2016   Procedure: INSERTION OF TUNNELED DIALYSIS CATHETER;  Surgeon: Elam Dutch, MD;  Location: [place];  Service: Vascular;  Laterality: Right;  . LEFT HEART CATH AND CORONARY ANGIOGRAPHY N/A 10/23/2016   Procedure: LEFT HEART CATH AND CORONARY ANGIOGRAPHY;  Surgeon: Wellington Hampshire, MD;  Location: [place] CV LAB;  Service: Cardiovascular;  Laterality: N/A;  . LIGATION OF ARTERIOVENOUS  FISTULA Right 05/12/2017   Procedure: BANDING OF RIGHT UPPER ARM ARTERIOVENOUS  FISTULA USING 6MM X 10CM GORE TEX VASCULAR GRAFT;  Surgeon: Angelia Mould, MD;  Location: [place];  Service: Vascular;  Laterality: Right;  . LIGATION OF ARTERIOVENOUS  FISTULA Right 07/03/2017   Procedure: LIGATION OF ARTERIOVENOUS  FISTULA RIGHT ARM;  Surgeon: Angelia Mould, MD;  Location: [place];  Service: Vascular;  Laterality: Right;  . TONSILLECTOMY       Current Outpatient Medications  Medication Sig Dispense Refill  . allopurinol (ZYLOPRIM) 100 MG tablet Take 1 tablet (100 mg total) by mouth daily. 30 tablet 0  . atorvastatin (LIPITOR) 80 MG tablet Take 1 tablet (80 mg total) by mouth daily at 6 PM. 90 tablet 0  .  b complex vitamins tablet Take 1 tablet by mouth See admin instructions. Given at dialysis    . calcitRIOL (ROCALTROL) 0.25 MCG capsule Take 1 capsule (0.25 mcg total) by mouth Every Tuesday,Thursday,and Saturday with dialysis. 30 capsule 0  . clopidogrel (PLAVIX) 75 MG tablet Take 1 tablet (75 mg total) by mouth daily. 90 tablet 0  . ferric citrate (AURYXIA) 1 GM 210 MG(Fe) tablet Take 630 mg by mouth 2 (two) times daily.     . isosorbide mononitrate (IMDUR) 30 MG 24 hr tablet Take 1 tablet (30 mg) by mouth once daily in the evening    . metoprolol succinate (TOPROL-XL) 25 MG 24 hr tablet Take 1 tablet (25 mg) by mouth once daily on non-dialysis days 30 tablet 6  . midodrine (PROAMATINE) 10 MG tablet Take 10 mg by mouth daily.     . nitroGLYCERIN (NITROSTAT) 0.4 MG SL tablet Place 1 tablet (0.4 mg total) under the tongue every 5 (five) minutes as needed for chest pain. 30 tablet 0  .  pantoprazole (PROTONIX) 40 MG tablet Take 40 mg by mouth daily.    . sucralfate (CARAFATE) 1 GM/10ML suspension Take 1 g by mouth 4 (four) times daily -  with meals and at bedtime.    . tamsulosin (FLOMAX) 0.4 MG CAPS capsule Take 0.4 mg by mouth daily.     No current facility-administered medications for this visit.     Allergies:   Patient has no known allergies.    Social History:  The patient  reports that he quit smoking about 2 years ago. His smoking use included cigarettes. He has a 25.00 pack-year smoking history. He has never used smokeless tobacco. He reports that he does not drink alcohol or use drugs.   Family History:  The patient's family history includes Cancer in his brother, father, mother, and sister; Diabetes in his brother and father; Heart attack in his father; Hypertension in his brother, daughter, and father.    ROS:  Please see the history of present illness.   Otherwise, review of systems are positive for none.   All other systems are reviewed and negative.    PHYSICAL EXAM: VS:  BP  127/66 (BP Location: Left Arm, Patient Position: Sitting, Cuff Size: Normal)   Pulse (!) 59   Ht 5\' 6"  (1.676 m)   Wt 187 lb 8 oz (85 kg)   BMI 30.26 kg/m  , BMI Body mass index is 30.26 kg/m. GEN: Well nourished, well developed, in no acute distress  HEENT: normal  Neck: no JVD, or masses.  Bilateral carotid bruits Cardiac: RRR; no  rubs, or gallops,no edema . 2/6 systolic ejection murmur at the aortic area Respiratory:  clear to auscultation bilaterally, normal work of breathing GI: soft, nontender, nondistended, + BS MS: no deformity or atrophy  Skin: warm and dry, no rash Neuro:  Strength and sensation are intact Psych: euthymic mood, full affect   EKG:  EKG is ordered today. The ekg ordered today demonstrates sinus bradycardia with first-degree AV block.  Recent Labs: 05/14/2018: BUN 21; Creatinine, Ser 5.19; Hemoglobin 10.4; Platelets 141; Potassium 4.1; Sodium 137    Lipid Panel    Component Value Date/Time   CHOL 208 (H) 10/20/2016 0228   TRIG 225 (H) 10/20/2016 0228   HDL 23 (L) 10/20/2016 0228   CHOLHDL 9.0 10/20/2016 0228   VLDL 45 (H) 10/20/2016 0228   LDLCALC 140 (H) 10/20/2016 0228      Wt Readings from Last 3 Encounters:  10/23/18 187 lb 8 oz (85 kg)  09/25/18 186 lb 1.9 oz (84.4 kg)  09/10/17 182 lb (82.6 kg)     No flowsheet data found.    ASSESSMENT AND PLAN:  1.  Coronary artery disease involving native coronary arteries with other forms of angina: The patient is status post angioplasty and drug-eluting stent placement to the mid LAD in September 2018.  He is tolerating Plavix monotherapy without recurrent GI bleed.  Recent stress test was low risk overall with no significant ischemia.  He reports improvement and chest pain.  Continue medical therapy for now.  Drop in blood pressure during dialysis might be leading to coronary ischemia especially with chronically occluded RCA and left circumflex.   2.  Essential hypertension: Currently he is on  Toprol and Imdur.  Blood pressure continues to drop during dialysis and thus I elected to increase midodrine to 10 mg twice daily on dialysis days.  3. Hyperlipidemia: Continue high dose atorvastatin.  4. Peripheral arterial disease: Continue medical therapy.  The  patient denies claudication.    6.  Moderate bilateral carotid disease: Most recent Doppler was in February 2019.  The patient is due for a follow-up carotid Doppler.  Which will be ordered during next visit.  Disposition:   FU with me in 4 months.  Signed,  [name], MD  10/23/2018 3:05 PM    [facility]

## 2018-10-24 DIAGNOSIS — N186 End stage renal disease: Secondary | ICD-10-CM | POA: Diagnosis not present

## 2018-10-24 DIAGNOSIS — N2581 Secondary hyperparathyroidism of renal origin: Secondary | ICD-10-CM | POA: Diagnosis not present

## 2018-10-24 DIAGNOSIS — D689 Coagulation defect, unspecified: Secondary | ICD-10-CM | POA: Diagnosis not present

## 2018-10-24 DIAGNOSIS — Z992 Dependence on renal dialysis: Secondary | ICD-10-CM | POA: Diagnosis not present

## 2018-10-24 DIAGNOSIS — D631 Anemia in chronic kidney disease: Secondary | ICD-10-CM | POA: Diagnosis not present

## 2018-10-27 DIAGNOSIS — D689 Coagulation defect, unspecified: Secondary | ICD-10-CM | POA: Diagnosis not present

## 2018-10-27 DIAGNOSIS — Z992 Dependence on renal dialysis: Secondary | ICD-10-CM | POA: Diagnosis not present

## 2018-10-27 DIAGNOSIS — N186 End stage renal disease: Secondary | ICD-10-CM | POA: Diagnosis not present

## 2018-10-27 DIAGNOSIS — D631 Anemia in chronic kidney disease: Secondary | ICD-10-CM | POA: Diagnosis not present

## 2018-10-27 DIAGNOSIS — N2581 Secondary hyperparathyroidism of renal origin: Secondary | ICD-10-CM | POA: Diagnosis not present

## 2018-10-29 DIAGNOSIS — D631 Anemia in chronic kidney disease: Secondary | ICD-10-CM | POA: Diagnosis not present

## 2018-10-29 DIAGNOSIS — Z992 Dependence on renal dialysis: Secondary | ICD-10-CM | POA: Diagnosis not present

## 2018-10-29 DIAGNOSIS — N186 End stage renal disease: Secondary | ICD-10-CM | POA: Diagnosis not present

## 2018-10-29 DIAGNOSIS — D689 Coagulation defect, unspecified: Secondary | ICD-10-CM | POA: Diagnosis not present

## 2018-10-29 DIAGNOSIS — N2581 Secondary hyperparathyroidism of renal origin: Secondary | ICD-10-CM | POA: Diagnosis not present

## 2018-10-31 DIAGNOSIS — D631 Anemia in chronic kidney disease: Secondary | ICD-10-CM | POA: Diagnosis not present

## 2018-10-31 DIAGNOSIS — N186 End stage renal disease: Secondary | ICD-10-CM | POA: Diagnosis not present

## 2018-10-31 DIAGNOSIS — D689 Coagulation defect, unspecified: Secondary | ICD-10-CM | POA: Diagnosis not present

## 2018-10-31 DIAGNOSIS — Z992 Dependence on renal dialysis: Secondary | ICD-10-CM | POA: Diagnosis not present

## 2018-10-31 DIAGNOSIS — N2581 Secondary hyperparathyroidism of renal origin: Secondary | ICD-10-CM | POA: Diagnosis not present

## 2018-11-02 DIAGNOSIS — K229 Disease of esophagus, unspecified: Secondary | ICD-10-CM | POA: Diagnosis not present

## 2018-11-02 DIAGNOSIS — K219 Gastro-esophageal reflux disease without esophagitis: Secondary | ICD-10-CM | POA: Diagnosis not present

## 2018-11-02 DIAGNOSIS — R142 Eructation: Secondary | ICD-10-CM | POA: Diagnosis not present

## 2018-11-02 DIAGNOSIS — R112 Nausea with vomiting, unspecified: Secondary | ICD-10-CM | POA: Diagnosis not present

## 2018-11-02 DIAGNOSIS — Z992 Dependence on renal dialysis: Secondary | ICD-10-CM | POA: Diagnosis not present

## 2018-11-03 DIAGNOSIS — D631 Anemia in chronic kidney disease: Secondary | ICD-10-CM | POA: Diagnosis not present

## 2018-11-03 DIAGNOSIS — N186 End stage renal disease: Secondary | ICD-10-CM | POA: Diagnosis not present

## 2018-11-03 DIAGNOSIS — Z992 Dependence on renal dialysis: Secondary | ICD-10-CM | POA: Diagnosis not present

## 2018-11-03 DIAGNOSIS — N2581 Secondary hyperparathyroidism of renal origin: Secondary | ICD-10-CM | POA: Diagnosis not present

## 2018-11-03 DIAGNOSIS — D689 Coagulation defect, unspecified: Secondary | ICD-10-CM | POA: Diagnosis not present

## 2018-11-04 ENCOUNTER — Other Ambulatory Visit: Payer: Self-pay | Admitting: Gastroenterology

## 2018-11-04 DIAGNOSIS — D7389 Other diseases of spleen: Secondary | ICD-10-CM

## 2018-11-05 DIAGNOSIS — D689 Coagulation defect, unspecified: Secondary | ICD-10-CM | POA: Diagnosis not present

## 2018-11-05 DIAGNOSIS — N186 End stage renal disease: Secondary | ICD-10-CM | POA: Diagnosis not present

## 2018-11-05 DIAGNOSIS — Z992 Dependence on renal dialysis: Secondary | ICD-10-CM | POA: Diagnosis not present

## 2018-11-05 DIAGNOSIS — N2581 Secondary hyperparathyroidism of renal origin: Secondary | ICD-10-CM | POA: Diagnosis not present

## 2018-11-05 DIAGNOSIS — D631 Anemia in chronic kidney disease: Secondary | ICD-10-CM | POA: Diagnosis not present

## 2018-11-07 DIAGNOSIS — N186 End stage renal disease: Secondary | ICD-10-CM | POA: Diagnosis not present

## 2018-11-07 DIAGNOSIS — D689 Coagulation defect, unspecified: Secondary | ICD-10-CM | POA: Diagnosis not present

## 2018-11-07 DIAGNOSIS — N2581 Secondary hyperparathyroidism of renal origin: Secondary | ICD-10-CM | POA: Diagnosis not present

## 2018-11-07 DIAGNOSIS — D631 Anemia in chronic kidney disease: Secondary | ICD-10-CM | POA: Diagnosis not present

## 2018-11-07 DIAGNOSIS — Z992 Dependence on renal dialysis: Secondary | ICD-10-CM | POA: Diagnosis not present

## 2018-11-10 DIAGNOSIS — D631 Anemia in chronic kidney disease: Secondary | ICD-10-CM | POA: Diagnosis not present

## 2018-11-10 DIAGNOSIS — D689 Coagulation defect, unspecified: Secondary | ICD-10-CM | POA: Diagnosis not present

## 2018-11-10 DIAGNOSIS — Z992 Dependence on renal dialysis: Secondary | ICD-10-CM | POA: Diagnosis not present

## 2018-11-10 DIAGNOSIS — N2581 Secondary hyperparathyroidism of renal origin: Secondary | ICD-10-CM | POA: Diagnosis not present

## 2018-11-10 DIAGNOSIS — N186 End stage renal disease: Secondary | ICD-10-CM | POA: Diagnosis not present

## 2018-11-12 DIAGNOSIS — N2581 Secondary hyperparathyroidism of renal origin: Secondary | ICD-10-CM | POA: Diagnosis not present

## 2018-11-12 DIAGNOSIS — N186 End stage renal disease: Secondary | ICD-10-CM | POA: Diagnosis not present

## 2018-11-12 DIAGNOSIS — Z992 Dependence on renal dialysis: Secondary | ICD-10-CM | POA: Diagnosis not present

## 2018-11-12 DIAGNOSIS — D689 Coagulation defect, unspecified: Secondary | ICD-10-CM | POA: Diagnosis not present

## 2018-11-12 DIAGNOSIS — D631 Anemia in chronic kidney disease: Secondary | ICD-10-CM | POA: Diagnosis not present

## 2018-11-14 DIAGNOSIS — N2581 Secondary hyperparathyroidism of renal origin: Secondary | ICD-10-CM | POA: Diagnosis not present

## 2018-11-14 DIAGNOSIS — Z992 Dependence on renal dialysis: Secondary | ICD-10-CM | POA: Diagnosis not present

## 2018-11-14 DIAGNOSIS — N186 End stage renal disease: Secondary | ICD-10-CM | POA: Diagnosis not present

## 2018-11-14 DIAGNOSIS — D631 Anemia in chronic kidney disease: Secondary | ICD-10-CM | POA: Diagnosis not present

## 2018-11-14 DIAGNOSIS — D689 Coagulation defect, unspecified: Secondary | ICD-10-CM | POA: Diagnosis not present

## 2018-11-16 DIAGNOSIS — Z992 Dependence on renal dialysis: Secondary | ICD-10-CM | POA: Diagnosis not present

## 2018-11-16 DIAGNOSIS — I129 Hypertensive chronic kidney disease with stage 1 through stage 4 chronic kidney disease, or unspecified chronic kidney disease: Secondary | ICD-10-CM | POA: Diagnosis not present

## 2018-11-16 DIAGNOSIS — N186 End stage renal disease: Secondary | ICD-10-CM | POA: Diagnosis not present

## 2018-11-17 DIAGNOSIS — D689 Coagulation defect, unspecified: Secondary | ICD-10-CM | POA: Diagnosis not present

## 2018-11-17 DIAGNOSIS — N2581 Secondary hyperparathyroidism of renal origin: Secondary | ICD-10-CM | POA: Diagnosis not present

## 2018-11-17 DIAGNOSIS — N186 End stage renal disease: Secondary | ICD-10-CM | POA: Diagnosis not present

## 2018-11-17 DIAGNOSIS — D631 Anemia in chronic kidney disease: Secondary | ICD-10-CM | POA: Diagnosis not present

## 2018-11-17 DIAGNOSIS — Z992 Dependence on renal dialysis: Secondary | ICD-10-CM | POA: Diagnosis not present

## 2018-11-19 DIAGNOSIS — Z992 Dependence on renal dialysis: Secondary | ICD-10-CM | POA: Diagnosis not present

## 2018-11-19 DIAGNOSIS — D631 Anemia in chronic kidney disease: Secondary | ICD-10-CM | POA: Diagnosis not present

## 2018-11-19 DIAGNOSIS — N186 End stage renal disease: Secondary | ICD-10-CM | POA: Diagnosis not present

## 2018-11-19 DIAGNOSIS — N2581 Secondary hyperparathyroidism of renal origin: Secondary | ICD-10-CM | POA: Diagnosis not present

## 2018-11-19 DIAGNOSIS — D689 Coagulation defect, unspecified: Secondary | ICD-10-CM | POA: Diagnosis not present

## 2018-11-21 DIAGNOSIS — N2581 Secondary hyperparathyroidism of renal origin: Secondary | ICD-10-CM | POA: Diagnosis not present

## 2018-11-21 DIAGNOSIS — D689 Coagulation defect, unspecified: Secondary | ICD-10-CM | POA: Diagnosis not present

## 2018-11-21 DIAGNOSIS — D631 Anemia in chronic kidney disease: Secondary | ICD-10-CM | POA: Diagnosis not present

## 2018-11-21 DIAGNOSIS — N186 End stage renal disease: Secondary | ICD-10-CM | POA: Diagnosis not present

## 2018-11-21 DIAGNOSIS — Z992 Dependence on renal dialysis: Secondary | ICD-10-CM | POA: Diagnosis not present

## 2018-11-24 DIAGNOSIS — Z992 Dependence on renal dialysis: Secondary | ICD-10-CM | POA: Diagnosis not present

## 2018-11-24 DIAGNOSIS — N186 End stage renal disease: Secondary | ICD-10-CM | POA: Diagnosis not present

## 2018-11-24 DIAGNOSIS — N2581 Secondary hyperparathyroidism of renal origin: Secondary | ICD-10-CM | POA: Diagnosis not present

## 2018-11-24 DIAGNOSIS — D631 Anemia in chronic kidney disease: Secondary | ICD-10-CM | POA: Diagnosis not present

## 2018-11-24 DIAGNOSIS — D689 Coagulation defect, unspecified: Secondary | ICD-10-CM | POA: Diagnosis not present

## 2018-11-26 DIAGNOSIS — D689 Coagulation defect, unspecified: Secondary | ICD-10-CM | POA: Diagnosis not present

## 2018-11-26 DIAGNOSIS — N186 End stage renal disease: Secondary | ICD-10-CM | POA: Diagnosis not present

## 2018-11-26 DIAGNOSIS — D631 Anemia in chronic kidney disease: Secondary | ICD-10-CM | POA: Diagnosis not present

## 2018-11-26 DIAGNOSIS — N2581 Secondary hyperparathyroidism of renal origin: Secondary | ICD-10-CM | POA: Diagnosis not present

## 2018-11-26 DIAGNOSIS — Z992 Dependence on renal dialysis: Secondary | ICD-10-CM | POA: Diagnosis not present

## 2018-11-28 DIAGNOSIS — N186 End stage renal disease: Secondary | ICD-10-CM | POA: Diagnosis not present

## 2018-11-28 DIAGNOSIS — D689 Coagulation defect, unspecified: Secondary | ICD-10-CM | POA: Diagnosis not present

## 2018-11-28 DIAGNOSIS — D631 Anemia in chronic kidney disease: Secondary | ICD-10-CM | POA: Diagnosis not present

## 2018-11-28 DIAGNOSIS — N2581 Secondary hyperparathyroidism of renal origin: Secondary | ICD-10-CM | POA: Diagnosis not present

## 2018-11-28 DIAGNOSIS — Z992 Dependence on renal dialysis: Secondary | ICD-10-CM | POA: Diagnosis not present

## 2018-11-29 ENCOUNTER — Other Ambulatory Visit: Payer: Self-pay | Admitting: Physician Assistant

## 2018-12-01 DIAGNOSIS — D689 Coagulation defect, unspecified: Secondary | ICD-10-CM | POA: Diagnosis not present

## 2018-12-01 DIAGNOSIS — N2581 Secondary hyperparathyroidism of renal origin: Secondary | ICD-10-CM | POA: Diagnosis not present

## 2018-12-01 DIAGNOSIS — N186 End stage renal disease: Secondary | ICD-10-CM | POA: Diagnosis not present

## 2018-12-01 DIAGNOSIS — Z992 Dependence on renal dialysis: Secondary | ICD-10-CM | POA: Diagnosis not present

## 2018-12-01 DIAGNOSIS — D631 Anemia in chronic kidney disease: Secondary | ICD-10-CM | POA: Diagnosis not present

## 2018-12-02 ENCOUNTER — Other Ambulatory Visit: Payer: Self-pay | Admitting: Gastroenterology

## 2018-12-02 ENCOUNTER — Other Ambulatory Visit: Payer: Self-pay

## 2018-12-02 ENCOUNTER — Ambulatory Visit
Admission: RE | Admit: 2018-12-02 | Discharge: 2018-12-02 | Disposition: A | Discharge status: Home / Self Care | Payer: Medicare Other | Source: Ambulatory Visit | Attending: Gastroenterology | Admitting: Gastroenterology

## 2018-12-02 DIAGNOSIS — D7389 Other diseases of spleen: Secondary | ICD-10-CM

## 2018-12-02 DIAGNOSIS — N186 End stage renal disease: Secondary | ICD-10-CM | POA: Diagnosis not present

## 2018-12-03 DIAGNOSIS — N2581 Secondary hyperparathyroidism of renal origin: Secondary | ICD-10-CM | POA: Diagnosis not present

## 2018-12-03 DIAGNOSIS — D689 Coagulation defect, unspecified: Secondary | ICD-10-CM | POA: Diagnosis not present

## 2018-12-03 DIAGNOSIS — N186 End stage renal disease: Secondary | ICD-10-CM | POA: Diagnosis not present

## 2018-12-03 DIAGNOSIS — D631 Anemia in chronic kidney disease: Secondary | ICD-10-CM | POA: Diagnosis not present

## 2018-12-03 DIAGNOSIS — Z992 Dependence on renal dialysis: Secondary | ICD-10-CM | POA: Diagnosis not present

## 2018-12-05 DIAGNOSIS — N2581 Secondary hyperparathyroidism of renal origin: Secondary | ICD-10-CM | POA: Diagnosis not present

## 2018-12-05 DIAGNOSIS — D689 Coagulation defect, unspecified: Secondary | ICD-10-CM | POA: Diagnosis not present

## 2018-12-05 DIAGNOSIS — Z992 Dependence on renal dialysis: Secondary | ICD-10-CM | POA: Diagnosis not present

## 2018-12-05 DIAGNOSIS — N186 End stage renal disease: Secondary | ICD-10-CM | POA: Diagnosis not present

## 2018-12-05 DIAGNOSIS — D631 Anemia in chronic kidney disease: Secondary | ICD-10-CM | POA: Diagnosis not present

## 2018-12-08 DIAGNOSIS — D631 Anemia in chronic kidney disease: Secondary | ICD-10-CM | POA: Diagnosis not present

## 2018-12-08 DIAGNOSIS — D689 Coagulation defect, unspecified: Secondary | ICD-10-CM | POA: Diagnosis not present

## 2018-12-08 DIAGNOSIS — N2581 Secondary hyperparathyroidism of renal origin: Secondary | ICD-10-CM | POA: Diagnosis not present

## 2018-12-08 DIAGNOSIS — Z992 Dependence on renal dialysis: Secondary | ICD-10-CM | POA: Diagnosis not present

## 2018-12-08 DIAGNOSIS — N186 End stage renal disease: Secondary | ICD-10-CM | POA: Diagnosis not present

## 2018-12-10 DIAGNOSIS — D689 Coagulation defect, unspecified: Secondary | ICD-10-CM | POA: Diagnosis not present

## 2018-12-10 DIAGNOSIS — D631 Anemia in chronic kidney disease: Secondary | ICD-10-CM | POA: Diagnosis not present

## 2018-12-10 DIAGNOSIS — N2581 Secondary hyperparathyroidism of renal origin: Secondary | ICD-10-CM | POA: Diagnosis not present

## 2018-12-10 DIAGNOSIS — N186 End stage renal disease: Secondary | ICD-10-CM | POA: Diagnosis not present

## 2018-12-10 DIAGNOSIS — Z992 Dependence on renal dialysis: Secondary | ICD-10-CM | POA: Diagnosis not present

## 2018-12-12 DIAGNOSIS — D631 Anemia in chronic kidney disease: Secondary | ICD-10-CM | POA: Diagnosis not present

## 2018-12-12 DIAGNOSIS — Z992 Dependence on renal dialysis: Secondary | ICD-10-CM | POA: Diagnosis not present

## 2018-12-12 DIAGNOSIS — N186 End stage renal disease: Secondary | ICD-10-CM | POA: Diagnosis not present

## 2018-12-12 DIAGNOSIS — D689 Coagulation defect, unspecified: Secondary | ICD-10-CM | POA: Diagnosis not present

## 2018-12-12 DIAGNOSIS — N2581 Secondary hyperparathyroidism of renal origin: Secondary | ICD-10-CM | POA: Diagnosis not present

## 2018-12-15 DIAGNOSIS — N2581 Secondary hyperparathyroidism of renal origin: Secondary | ICD-10-CM | POA: Diagnosis not present

## 2018-12-15 DIAGNOSIS — D689 Coagulation defect, unspecified: Secondary | ICD-10-CM | POA: Diagnosis not present

## 2018-12-15 DIAGNOSIS — Z992 Dependence on renal dialysis: Secondary | ICD-10-CM | POA: Diagnosis not present

## 2018-12-15 DIAGNOSIS — D631 Anemia in chronic kidney disease: Secondary | ICD-10-CM | POA: Diagnosis not present

## 2018-12-15 DIAGNOSIS — N186 End stage renal disease: Secondary | ICD-10-CM | POA: Diagnosis not present

## 2018-12-16 DIAGNOSIS — N186 End stage renal disease: Secondary | ICD-10-CM | POA: Diagnosis not present

## 2018-12-16 DIAGNOSIS — Z992 Dependence on renal dialysis: Secondary | ICD-10-CM | POA: Diagnosis not present

## 2018-12-16 DIAGNOSIS — I129 Hypertensive chronic kidney disease with stage 1 through stage 4 chronic kidney disease, or unspecified chronic kidney disease: Secondary | ICD-10-CM | POA: Diagnosis not present

## 2018-12-17 DIAGNOSIS — Z992 Dependence on renal dialysis: Secondary | ICD-10-CM | POA: Diagnosis not present

## 2018-12-17 DIAGNOSIS — D689 Coagulation defect, unspecified: Secondary | ICD-10-CM | POA: Diagnosis not present

## 2018-12-17 DIAGNOSIS — N186 End stage renal disease: Secondary | ICD-10-CM | POA: Diagnosis not present

## 2018-12-17 DIAGNOSIS — N2581 Secondary hyperparathyroidism of renal origin: Secondary | ICD-10-CM | POA: Diagnosis not present

## 2018-12-17 DIAGNOSIS — D631 Anemia in chronic kidney disease: Secondary | ICD-10-CM | POA: Diagnosis not present

## 2018-12-19 DIAGNOSIS — Z992 Dependence on renal dialysis: Secondary | ICD-10-CM | POA: Diagnosis not present

## 2018-12-19 DIAGNOSIS — N186 End stage renal disease: Secondary | ICD-10-CM | POA: Diagnosis not present

## 2018-12-19 DIAGNOSIS — D689 Coagulation defect, unspecified: Secondary | ICD-10-CM | POA: Diagnosis not present

## 2018-12-19 DIAGNOSIS — N2581 Secondary hyperparathyroidism of renal origin: Secondary | ICD-10-CM | POA: Diagnosis not present

## 2018-12-19 DIAGNOSIS — D631 Anemia in chronic kidney disease: Secondary | ICD-10-CM | POA: Diagnosis not present

## 2018-12-22 DIAGNOSIS — N2581 Secondary hyperparathyroidism of renal origin: Secondary | ICD-10-CM | POA: Diagnosis not present

## 2018-12-22 DIAGNOSIS — D631 Anemia in chronic kidney disease: Secondary | ICD-10-CM | POA: Diagnosis not present

## 2018-12-22 DIAGNOSIS — Z992 Dependence on renal dialysis: Secondary | ICD-10-CM | POA: Diagnosis not present

## 2018-12-22 DIAGNOSIS — N186 End stage renal disease: Secondary | ICD-10-CM | POA: Diagnosis not present

## 2018-12-22 DIAGNOSIS — D689 Coagulation defect, unspecified: Secondary | ICD-10-CM | POA: Diagnosis not present

## 2018-12-24 DIAGNOSIS — N2581 Secondary hyperparathyroidism of renal origin: Secondary | ICD-10-CM | POA: Diagnosis not present

## 2018-12-24 DIAGNOSIS — N186 End stage renal disease: Secondary | ICD-10-CM | POA: Diagnosis not present

## 2018-12-24 DIAGNOSIS — D689 Coagulation defect, unspecified: Secondary | ICD-10-CM | POA: Diagnosis not present

## 2018-12-24 DIAGNOSIS — Z992 Dependence on renal dialysis: Secondary | ICD-10-CM | POA: Diagnosis not present

## 2018-12-24 DIAGNOSIS — D631 Anemia in chronic kidney disease: Secondary | ICD-10-CM | POA: Diagnosis not present

## 2018-12-25 DIAGNOSIS — Z23 Encounter for immunization: Secondary | ICD-10-CM | POA: Diagnosis not present

## 2018-12-26 DIAGNOSIS — N2581 Secondary hyperparathyroidism of renal origin: Secondary | ICD-10-CM | POA: Diagnosis not present

## 2018-12-26 DIAGNOSIS — D689 Coagulation defect, unspecified: Secondary | ICD-10-CM | POA: Diagnosis not present

## 2018-12-26 DIAGNOSIS — N186 End stage renal disease: Secondary | ICD-10-CM | POA: Diagnosis not present

## 2018-12-26 DIAGNOSIS — Z992 Dependence on renal dialysis: Secondary | ICD-10-CM | POA: Diagnosis not present

## 2018-12-26 DIAGNOSIS — D631 Anemia in chronic kidney disease: Secondary | ICD-10-CM | POA: Diagnosis not present

## 2018-12-29 DIAGNOSIS — D631 Anemia in chronic kidney disease: Secondary | ICD-10-CM | POA: Diagnosis not present

## 2018-12-29 DIAGNOSIS — N2581 Secondary hyperparathyroidism of renal origin: Secondary | ICD-10-CM | POA: Diagnosis not present

## 2018-12-29 DIAGNOSIS — Z992 Dependence on renal dialysis: Secondary | ICD-10-CM | POA: Diagnosis not present

## 2018-12-29 DIAGNOSIS — N186 End stage renal disease: Secondary | ICD-10-CM | POA: Diagnosis not present

## 2018-12-29 DIAGNOSIS — D689 Coagulation defect, unspecified: Secondary | ICD-10-CM | POA: Diagnosis not present

## 2018-12-31 DIAGNOSIS — D689 Coagulation defect, unspecified: Secondary | ICD-10-CM | POA: Diagnosis not present

## 2018-12-31 DIAGNOSIS — N2581 Secondary hyperparathyroidism of renal origin: Secondary | ICD-10-CM | POA: Diagnosis not present

## 2018-12-31 DIAGNOSIS — N186 End stage renal disease: Secondary | ICD-10-CM | POA: Diagnosis not present

## 2018-12-31 DIAGNOSIS — D631 Anemia in chronic kidney disease: Secondary | ICD-10-CM | POA: Diagnosis not present

## 2018-12-31 DIAGNOSIS — Z992 Dependence on renal dialysis: Secondary | ICD-10-CM | POA: Diagnosis not present

## 2019-01-02 DIAGNOSIS — D631 Anemia in chronic kidney disease: Secondary | ICD-10-CM | POA: Diagnosis not present

## 2019-01-02 DIAGNOSIS — N2581 Secondary hyperparathyroidism of renal origin: Secondary | ICD-10-CM | POA: Diagnosis not present

## 2019-01-02 DIAGNOSIS — D689 Coagulation defect, unspecified: Secondary | ICD-10-CM | POA: Diagnosis not present

## 2019-01-02 DIAGNOSIS — N186 End stage renal disease: Secondary | ICD-10-CM | POA: Diagnosis not present

## 2019-01-02 DIAGNOSIS — Z992 Dependence on renal dialysis: Secondary | ICD-10-CM | POA: Diagnosis not present

## 2019-01-05 DIAGNOSIS — N186 End stage renal disease: Secondary | ICD-10-CM | POA: Diagnosis not present

## 2019-01-05 DIAGNOSIS — Z992 Dependence on renal dialysis: Secondary | ICD-10-CM | POA: Diagnosis not present

## 2019-01-05 DIAGNOSIS — N2581 Secondary hyperparathyroidism of renal origin: Secondary | ICD-10-CM | POA: Diagnosis not present

## 2019-01-05 DIAGNOSIS — D631 Anemia in chronic kidney disease: Secondary | ICD-10-CM | POA: Diagnosis not present

## 2019-01-05 DIAGNOSIS — D689 Coagulation defect, unspecified: Secondary | ICD-10-CM | POA: Diagnosis not present

## 2019-01-07 DIAGNOSIS — Z992 Dependence on renal dialysis: Secondary | ICD-10-CM | POA: Diagnosis not present

## 2019-01-07 DIAGNOSIS — N186 End stage renal disease: Secondary | ICD-10-CM | POA: Diagnosis not present

## 2019-01-07 DIAGNOSIS — D689 Coagulation defect, unspecified: Secondary | ICD-10-CM | POA: Diagnosis not present

## 2019-01-07 DIAGNOSIS — D631 Anemia in chronic kidney disease: Secondary | ICD-10-CM | POA: Diagnosis not present

## 2019-01-07 DIAGNOSIS — N2581 Secondary hyperparathyroidism of renal origin: Secondary | ICD-10-CM | POA: Diagnosis not present

## 2019-01-09 DIAGNOSIS — D631 Anemia in chronic kidney disease: Secondary | ICD-10-CM | POA: Diagnosis not present

## 2019-01-09 DIAGNOSIS — N2581 Secondary hyperparathyroidism of renal origin: Secondary | ICD-10-CM | POA: Diagnosis not present

## 2019-01-09 DIAGNOSIS — D689 Coagulation defect, unspecified: Secondary | ICD-10-CM | POA: Diagnosis not present

## 2019-01-09 DIAGNOSIS — N186 End stage renal disease: Secondary | ICD-10-CM | POA: Diagnosis not present

## 2019-01-09 DIAGNOSIS — Z992 Dependence on renal dialysis: Secondary | ICD-10-CM | POA: Diagnosis not present

## 2019-01-12 DIAGNOSIS — N2581 Secondary hyperparathyroidism of renal origin: Secondary | ICD-10-CM | POA: Diagnosis not present

## 2019-01-12 DIAGNOSIS — D631 Anemia in chronic kidney disease: Secondary | ICD-10-CM | POA: Diagnosis not present

## 2019-01-12 DIAGNOSIS — D689 Coagulation defect, unspecified: Secondary | ICD-10-CM | POA: Diagnosis not present

## 2019-01-12 DIAGNOSIS — N186 End stage renal disease: Secondary | ICD-10-CM | POA: Diagnosis not present

## 2019-01-12 DIAGNOSIS — Z992 Dependence on renal dialysis: Secondary | ICD-10-CM | POA: Diagnosis not present

## 2019-01-14 DIAGNOSIS — Z992 Dependence on renal dialysis: Secondary | ICD-10-CM | POA: Diagnosis not present

## 2019-01-14 DIAGNOSIS — N2581 Secondary hyperparathyroidism of renal origin: Secondary | ICD-10-CM | POA: Diagnosis not present

## 2019-01-14 DIAGNOSIS — D631 Anemia in chronic kidney disease: Secondary | ICD-10-CM | POA: Diagnosis not present

## 2019-01-14 DIAGNOSIS — D689 Coagulation defect, unspecified: Secondary | ICD-10-CM | POA: Diagnosis not present

## 2019-01-14 DIAGNOSIS — N186 End stage renal disease: Secondary | ICD-10-CM | POA: Diagnosis not present

## 2019-01-16 DIAGNOSIS — I129 Hypertensive chronic kidney disease with stage 1 through stage 4 chronic kidney disease, or unspecified chronic kidney disease: Secondary | ICD-10-CM | POA: Diagnosis not present

## 2019-01-16 DIAGNOSIS — D631 Anemia in chronic kidney disease: Secondary | ICD-10-CM | POA: Diagnosis not present

## 2019-01-16 DIAGNOSIS — N2581 Secondary hyperparathyroidism of renal origin: Secondary | ICD-10-CM | POA: Diagnosis not present

## 2019-01-16 DIAGNOSIS — N186 End stage renal disease: Secondary | ICD-10-CM | POA: Diagnosis not present

## 2019-01-16 DIAGNOSIS — Z992 Dependence on renal dialysis: Secondary | ICD-10-CM | POA: Diagnosis not present

## 2019-01-16 DIAGNOSIS — D689 Coagulation defect, unspecified: Secondary | ICD-10-CM | POA: Diagnosis not present

## 2019-01-19 DIAGNOSIS — D689 Coagulation defect, unspecified: Secondary | ICD-10-CM | POA: Diagnosis not present

## 2019-01-19 DIAGNOSIS — N186 End stage renal disease: Secondary | ICD-10-CM | POA: Diagnosis not present

## 2019-01-19 DIAGNOSIS — D509 Iron deficiency anemia, unspecified: Secondary | ICD-10-CM | POA: Diagnosis not present

## 2019-01-19 DIAGNOSIS — N2581 Secondary hyperparathyroidism of renal origin: Secondary | ICD-10-CM | POA: Diagnosis not present

## 2019-01-19 DIAGNOSIS — Z992 Dependence on renal dialysis: Secondary | ICD-10-CM | POA: Diagnosis not present

## 2019-01-21 DIAGNOSIS — N186 End stage renal disease: Secondary | ICD-10-CM | POA: Diagnosis not present

## 2019-01-21 DIAGNOSIS — N2581 Secondary hyperparathyroidism of renal origin: Secondary | ICD-10-CM | POA: Diagnosis not present

## 2019-01-21 DIAGNOSIS — Z992 Dependence on renal dialysis: Secondary | ICD-10-CM | POA: Diagnosis not present

## 2019-01-21 DIAGNOSIS — D689 Coagulation defect, unspecified: Secondary | ICD-10-CM | POA: Diagnosis not present

## 2019-01-21 DIAGNOSIS — D509 Iron deficiency anemia, unspecified: Secondary | ICD-10-CM | POA: Diagnosis not present

## 2019-01-23 DIAGNOSIS — D509 Iron deficiency anemia, unspecified: Secondary | ICD-10-CM | POA: Diagnosis not present

## 2019-01-23 DIAGNOSIS — Z992 Dependence on renal dialysis: Secondary | ICD-10-CM | POA: Diagnosis not present

## 2019-01-23 DIAGNOSIS — N2581 Secondary hyperparathyroidism of renal origin: Secondary | ICD-10-CM | POA: Diagnosis not present

## 2019-01-23 DIAGNOSIS — N186 End stage renal disease: Secondary | ICD-10-CM | POA: Diagnosis not present

## 2019-01-23 DIAGNOSIS — D689 Coagulation defect, unspecified: Secondary | ICD-10-CM | POA: Diagnosis not present

## 2019-01-26 DIAGNOSIS — N186 End stage renal disease: Secondary | ICD-10-CM | POA: Diagnosis not present

## 2019-01-26 DIAGNOSIS — N2581 Secondary hyperparathyroidism of renal origin: Secondary | ICD-10-CM | POA: Diagnosis not present

## 2019-01-26 DIAGNOSIS — Z992 Dependence on renal dialysis: Secondary | ICD-10-CM | POA: Diagnosis not present

## 2019-01-26 DIAGNOSIS — D689 Coagulation defect, unspecified: Secondary | ICD-10-CM | POA: Diagnosis not present

## 2019-01-26 DIAGNOSIS — D509 Iron deficiency anemia, unspecified: Secondary | ICD-10-CM | POA: Diagnosis not present

## 2019-01-28 DIAGNOSIS — Z992 Dependence on renal dialysis: Secondary | ICD-10-CM | POA: Diagnosis not present

## 2019-01-28 DIAGNOSIS — D509 Iron deficiency anemia, unspecified: Secondary | ICD-10-CM | POA: Diagnosis not present

## 2019-01-28 DIAGNOSIS — N186 End stage renal disease: Secondary | ICD-10-CM | POA: Diagnosis not present

## 2019-01-28 DIAGNOSIS — D689 Coagulation defect, unspecified: Secondary | ICD-10-CM | POA: Diagnosis not present

## 2019-01-28 DIAGNOSIS — N2581 Secondary hyperparathyroidism of renal origin: Secondary | ICD-10-CM | POA: Diagnosis not present

## 2019-01-30 DIAGNOSIS — D509 Iron deficiency anemia, unspecified: Secondary | ICD-10-CM | POA: Diagnosis not present

## 2019-01-30 DIAGNOSIS — Z992 Dependence on renal dialysis: Secondary | ICD-10-CM | POA: Diagnosis not present

## 2019-01-30 DIAGNOSIS — N186 End stage renal disease: Secondary | ICD-10-CM | POA: Diagnosis not present

## 2019-01-30 DIAGNOSIS — D689 Coagulation defect, unspecified: Secondary | ICD-10-CM | POA: Diagnosis not present

## 2019-01-30 DIAGNOSIS — N2581 Secondary hyperparathyroidism of renal origin: Secondary | ICD-10-CM | POA: Diagnosis not present

## 2019-02-02 DIAGNOSIS — D509 Iron deficiency anemia, unspecified: Secondary | ICD-10-CM | POA: Diagnosis not present

## 2019-02-02 DIAGNOSIS — N2581 Secondary hyperparathyroidism of renal origin: Secondary | ICD-10-CM | POA: Diagnosis not present

## 2019-02-02 DIAGNOSIS — Z992 Dependence on renal dialysis: Secondary | ICD-10-CM | POA: Diagnosis not present

## 2019-02-02 DIAGNOSIS — D689 Coagulation defect, unspecified: Secondary | ICD-10-CM | POA: Diagnosis not present

## 2019-02-02 DIAGNOSIS — N186 End stage renal disease: Secondary | ICD-10-CM | POA: Diagnosis not present

## 2019-02-04 DIAGNOSIS — D509 Iron deficiency anemia, unspecified: Secondary | ICD-10-CM | POA: Diagnosis not present

## 2019-02-04 DIAGNOSIS — Z992 Dependence on renal dialysis: Secondary | ICD-10-CM | POA: Diagnosis not present

## 2019-02-04 DIAGNOSIS — N186 End stage renal disease: Secondary | ICD-10-CM | POA: Diagnosis not present

## 2019-02-04 DIAGNOSIS — D689 Coagulation defect, unspecified: Secondary | ICD-10-CM | POA: Diagnosis not present

## 2019-02-04 DIAGNOSIS — N2581 Secondary hyperparathyroidism of renal origin: Secondary | ICD-10-CM | POA: Diagnosis not present

## 2019-02-06 DIAGNOSIS — D509 Iron deficiency anemia, unspecified: Secondary | ICD-10-CM | POA: Diagnosis not present

## 2019-02-06 DIAGNOSIS — Z992 Dependence on renal dialysis: Secondary | ICD-10-CM | POA: Diagnosis not present

## 2019-02-06 DIAGNOSIS — N186 End stage renal disease: Secondary | ICD-10-CM | POA: Diagnosis not present

## 2019-02-06 DIAGNOSIS — N2581 Secondary hyperparathyroidism of renal origin: Secondary | ICD-10-CM | POA: Diagnosis not present

## 2019-02-06 DIAGNOSIS — D689 Coagulation defect, unspecified: Secondary | ICD-10-CM | POA: Diagnosis not present

## 2019-02-08 DIAGNOSIS — D689 Coagulation defect, unspecified: Secondary | ICD-10-CM | POA: Diagnosis not present

## 2019-02-08 DIAGNOSIS — N186 End stage renal disease: Secondary | ICD-10-CM | POA: Diagnosis not present

## 2019-02-08 DIAGNOSIS — N2581 Secondary hyperparathyroidism of renal origin: Secondary | ICD-10-CM | POA: Diagnosis not present

## 2019-02-08 DIAGNOSIS — D509 Iron deficiency anemia, unspecified: Secondary | ICD-10-CM | POA: Diagnosis not present

## 2019-02-08 DIAGNOSIS — Z992 Dependence on renal dialysis: Secondary | ICD-10-CM | POA: Diagnosis not present

## 2019-02-10 DIAGNOSIS — N2581 Secondary hyperparathyroidism of renal origin: Secondary | ICD-10-CM | POA: Diagnosis not present

## 2019-02-10 DIAGNOSIS — Z992 Dependence on renal dialysis: Secondary | ICD-10-CM | POA: Diagnosis not present

## 2019-02-10 DIAGNOSIS — D509 Iron deficiency anemia, unspecified: Secondary | ICD-10-CM | POA: Diagnosis not present

## 2019-02-10 DIAGNOSIS — D689 Coagulation defect, unspecified: Secondary | ICD-10-CM | POA: Diagnosis not present

## 2019-02-10 DIAGNOSIS — N186 End stage renal disease: Secondary | ICD-10-CM | POA: Diagnosis not present

## 2019-02-13 DIAGNOSIS — N186 End stage renal disease: Secondary | ICD-10-CM | POA: Diagnosis not present

## 2019-02-13 DIAGNOSIS — D689 Coagulation defect, unspecified: Secondary | ICD-10-CM | POA: Diagnosis not present

## 2019-02-13 DIAGNOSIS — N2581 Secondary hyperparathyroidism of renal origin: Secondary | ICD-10-CM | POA: Diagnosis not present

## 2019-02-13 DIAGNOSIS — D509 Iron deficiency anemia, unspecified: Secondary | ICD-10-CM | POA: Diagnosis not present

## 2019-02-13 DIAGNOSIS — Z992 Dependence on renal dialysis: Secondary | ICD-10-CM | POA: Diagnosis not present

## 2019-02-15 DIAGNOSIS — I129 Hypertensive chronic kidney disease with stage 1 through stage 4 chronic kidney disease, or unspecified chronic kidney disease: Secondary | ICD-10-CM | POA: Diagnosis not present

## 2019-02-15 DIAGNOSIS — Z992 Dependence on renal dialysis: Secondary | ICD-10-CM | POA: Diagnosis not present

## 2019-02-15 DIAGNOSIS — N186 End stage renal disease: Secondary | ICD-10-CM | POA: Diagnosis not present

## 2019-02-16 DIAGNOSIS — N2581 Secondary hyperparathyroidism of renal origin: Secondary | ICD-10-CM | POA: Diagnosis not present

## 2019-02-16 DIAGNOSIS — N186 End stage renal disease: Secondary | ICD-10-CM | POA: Diagnosis not present

## 2019-02-16 DIAGNOSIS — Z992 Dependence on renal dialysis: Secondary | ICD-10-CM | POA: Diagnosis not present

## 2019-02-16 DIAGNOSIS — A499 Bacterial infection, unspecified: Secondary | ICD-10-CM | POA: Diagnosis not present

## 2019-02-16 DIAGNOSIS — D689 Coagulation defect, unspecified: Secondary | ICD-10-CM | POA: Diagnosis not present

## 2019-02-18 DIAGNOSIS — N186 End stage renal disease: Secondary | ICD-10-CM | POA: Diagnosis not present

## 2019-02-18 DIAGNOSIS — Z992 Dependence on renal dialysis: Secondary | ICD-10-CM | POA: Diagnosis not present

## 2019-02-18 DIAGNOSIS — N2581 Secondary hyperparathyroidism of renal origin: Secondary | ICD-10-CM | POA: Diagnosis not present

## 2019-02-18 DIAGNOSIS — A499 Bacterial infection, unspecified: Secondary | ICD-10-CM | POA: Diagnosis not present

## 2019-02-18 DIAGNOSIS — D689 Coagulation defect, unspecified: Secondary | ICD-10-CM | POA: Diagnosis not present

## 2019-02-20 DIAGNOSIS — D689 Coagulation defect, unspecified: Secondary | ICD-10-CM | POA: Diagnosis not present

## 2019-02-20 DIAGNOSIS — N2581 Secondary hyperparathyroidism of renal origin: Secondary | ICD-10-CM | POA: Diagnosis not present

## 2019-02-20 DIAGNOSIS — Z992 Dependence on renal dialysis: Secondary | ICD-10-CM | POA: Diagnosis not present

## 2019-02-20 DIAGNOSIS — N186 End stage renal disease: Secondary | ICD-10-CM | POA: Diagnosis not present

## 2019-02-20 DIAGNOSIS — A499 Bacterial infection, unspecified: Secondary | ICD-10-CM | POA: Diagnosis not present

## 2019-02-23 ENCOUNTER — Ambulatory Visit: Payer: Medicare Other | Admitting: Cardiovascular Disease

## 2019-02-23 DIAGNOSIS — A499 Bacterial infection, unspecified: Secondary | ICD-10-CM | POA: Diagnosis not present

## 2019-02-23 DIAGNOSIS — N186 End stage renal disease: Secondary | ICD-10-CM | POA: Diagnosis not present

## 2019-02-23 DIAGNOSIS — N2581 Secondary hyperparathyroidism of renal origin: Secondary | ICD-10-CM | POA: Diagnosis not present

## 2019-02-23 DIAGNOSIS — D689 Coagulation defect, unspecified: Secondary | ICD-10-CM | POA: Diagnosis not present

## 2019-02-23 DIAGNOSIS — Z992 Dependence on renal dialysis: Secondary | ICD-10-CM | POA: Diagnosis not present

## 2019-02-25 DIAGNOSIS — A499 Bacterial infection, unspecified: Secondary | ICD-10-CM | POA: Diagnosis not present

## 2019-02-25 DIAGNOSIS — D689 Coagulation defect, unspecified: Secondary | ICD-10-CM | POA: Diagnosis not present

## 2019-02-25 DIAGNOSIS — N186 End stage renal disease: Secondary | ICD-10-CM | POA: Diagnosis not present

## 2019-02-25 DIAGNOSIS — Z992 Dependence on renal dialysis: Secondary | ICD-10-CM | POA: Diagnosis not present

## 2019-02-25 DIAGNOSIS — N2581 Secondary hyperparathyroidism of renal origin: Secondary | ICD-10-CM | POA: Diagnosis not present

## 2019-02-26 ENCOUNTER — Ambulatory Visit: Payer: Medicare Other | Admitting: Cardiovascular Disease

## 2019-02-27 DIAGNOSIS — D689 Coagulation defect, unspecified: Secondary | ICD-10-CM | POA: Diagnosis not present

## 2019-02-27 DIAGNOSIS — A499 Bacterial infection, unspecified: Secondary | ICD-10-CM | POA: Diagnosis not present

## 2019-02-27 DIAGNOSIS — Z992 Dependence on renal dialysis: Secondary | ICD-10-CM | POA: Diagnosis not present

## 2019-02-27 DIAGNOSIS — N2581 Secondary hyperparathyroidism of renal origin: Secondary | ICD-10-CM | POA: Diagnosis not present

## 2019-02-27 DIAGNOSIS — N186 End stage renal disease: Secondary | ICD-10-CM | POA: Diagnosis not present

## 2019-03-02 DIAGNOSIS — Z992 Dependence on renal dialysis: Secondary | ICD-10-CM | POA: Diagnosis not present

## 2019-03-02 DIAGNOSIS — D689 Coagulation defect, unspecified: Secondary | ICD-10-CM | POA: Diagnosis not present

## 2019-03-02 DIAGNOSIS — N2581 Secondary hyperparathyroidism of renal origin: Secondary | ICD-10-CM | POA: Diagnosis not present

## 2019-03-02 DIAGNOSIS — A499 Bacterial infection, unspecified: Secondary | ICD-10-CM | POA: Diagnosis not present

## 2019-03-02 DIAGNOSIS — N186 End stage renal disease: Secondary | ICD-10-CM | POA: Diagnosis not present

## 2019-03-04 DIAGNOSIS — Z992 Dependence on renal dialysis: Secondary | ICD-10-CM | POA: Diagnosis not present

## 2019-03-04 DIAGNOSIS — N2581 Secondary hyperparathyroidism of renal origin: Secondary | ICD-10-CM | POA: Diagnosis not present

## 2019-03-04 DIAGNOSIS — D689 Coagulation defect, unspecified: Secondary | ICD-10-CM | POA: Diagnosis not present

## 2019-03-04 DIAGNOSIS — N186 End stage renal disease: Secondary | ICD-10-CM | POA: Diagnosis not present

## 2019-03-04 DIAGNOSIS — A499 Bacterial infection, unspecified: Secondary | ICD-10-CM | POA: Diagnosis not present

## 2019-03-05 ENCOUNTER — Ambulatory Visit (INDEPENDENT_AMBULATORY_CARE_PROVIDER_SITE_OTHER): Payer: Medicare Other | Admitting: Nurse Practitioner

## 2019-03-05 ENCOUNTER — Encounter: Payer: Self-pay | Admitting: Nurse Practitioner

## 2019-03-05 ENCOUNTER — Other Ambulatory Visit: Payer: Self-pay

## 2019-03-05 VITALS — BP 110/60 | HR 53 | Ht 66.0 in | Wt 185.0 lb

## 2019-03-05 DIAGNOSIS — E785 Hyperlipidemia, unspecified: Secondary | ICD-10-CM

## 2019-03-05 DIAGNOSIS — I10 Essential (primary) hypertension: Secondary | ICD-10-CM | POA: Diagnosis not present

## 2019-03-05 DIAGNOSIS — I2510 Atherosclerotic heart disease of native coronary artery without angina pectoris: Secondary | ICD-10-CM | POA: Diagnosis not present

## 2019-03-05 DIAGNOSIS — I25118 Atherosclerotic heart disease of native coronary artery with other forms of angina pectoris: Secondary | ICD-10-CM | POA: Diagnosis not present

## 2019-03-05 DIAGNOSIS — I779 Disorder of arteries and arterioles, unspecified: Secondary | ICD-10-CM

## 2019-03-05 DIAGNOSIS — N186 End stage renal disease: Secondary | ICD-10-CM

## 2019-03-05 NOTE — Progress Notes (Signed)
Office Visit    Patient Name: Roger [name] Date of Encounter: 03/05/2019  Primary Care Provider:  Lujean Amel, MD Primary Cardiologist:  [name], MD  Chief Complaint    78 year old male with a history of CAD status post prior drug-eluting stent placement to the LAD in 2018, paroxysmal atrial fibrillation not on anticoagulation secondary to history of GI bleeding, chronic diastolic congestive heart failure, end-stage renal disease on hemodialysis, peripheral arterial disease, carotid arterial disease, anemia of chronic disease, hypertension, and hyperlipidemia, who presents for follow-up of chest pain.  Past Medical History    Past Medical History:  Diagnosis Date   Anemia    Anginal pain ([place])    Arthritis    "all over; bad in the legs" (12/04/2016)   CAD (coronary artery disease)    a. NSTEMI in setting of anemia; b. 10/2016 MV: reversible ant, apical, inflat defect; c. 10/2016 Cath: LM nl, LAD 40p, 26m, D1 70, D2 80, RI min irregs, LCX 100ost, RCA 100p, 138m/d, RPDA fills via collats from LAD, RPAV small-->Initial conservative Rx in setting of GIB w/ plan for PCI LAD if H/H stable on ASA/Plavix;  d. 12/04/16 s/p PTCA and DES to mLAD; e. 09/2018 MV: EF 54%, no ischemia.   Carotid disease, bilateral ([place])    a. 04/2017 U/S: RICA 57-84, RCCA >69, LICA 62-95.   Depression    situational    Diastolic dysfunction    a. 10/2016 Echo: EF 55-60%, no rwma, Gr2 DD, triv MR, mildly dil LA; b. 07/2017 Echo: EF 60-65%, Gr1 DD. No rwma. AoV leaflet thickening (not felt to be SBE upon further review). Mild MR. Mildly dil LA.   ESRD on dialysis [facility])    "[place]; [address]; TTS usually; going to [facility] in [place], [place] right now while staying w/daughter" (12/04/2016)   GERD (gastroesophageal reflux disease)    GIB (gastrointestinal bleeding) 10/2011   a. 10/2016 3u PRBCs - EGD/Colonoscopy: angiodysplasia w/ diverticulosis. No apparent bleeding. Polypecotmy->tubular adenomas.    Gout    Headache    no migraines for years   Heart murmur    High cholesterol    History of blood transfusion 10/18/2016   "related to anemia" (12/04/2016)   History of kidney stones    years ago   Hypertension    Iron deficiency anemia    Myocardial infarction ([place]) 10/18/2016   Old MI (myocardial infarction)    "found evidence of this on 10/18/2016"   PAF (paroxysmal atrial fibrillation) ([place])    Peripheral vascular disease ([facility])    BLE   Prostate cancer ([place])    a. s/p seed implantation.   Tobacco abuse    Past Surgical History:  Procedure Laterality Date   AV FISTULA PLACEMENT Right 03/28/2017   Procedure: ARTERIOVENOUS (AV) BRACHIOCEPHALIC FISTULA CREATION;  Surgeon: Angelia Mould, MD;  Location: [place];  Service: Vascular;  Laterality: Right;   CARDIAC CATHETERIZATION  10/2016   COLONOSCOPY WITH PROPOFOL N/A 10/21/2016   Procedure: COLONOSCOPY WITH PROPOFOL;  Surgeon: Gatha Mayer, MD;  Location: [place];  Service: Endoscopy;  Laterality: N/A;   CORONARY ANGIOPLASTY WITH STENT PLACEMENT  12/04/2016   "1 stent"   CORONARY STENT INTERVENTION N/A 12/04/2016   Procedure: CORONARY STENT INTERVENTION;  Surgeon: Wellington Hampshire, MD;  Location: [place] CV LAB;  Service: Cardiovascular;  Laterality: N/A;   ESOPHAGOGASTRODUODENOSCOPY N/A 10/21/2016   Procedure: ESOPHAGOGASTRODUODENOSCOPY (EGD);  Surgeon: Gatha Mayer, MD;  Location[facility] ENDOSCOPY;  Service: Endoscopy;  Laterality: N/A;   INSERTION OF DIALYSIS CATHETER Right 10/22/2016   Procedure: INSERTION OF TUNNELED DIALYSIS CATHETER;  Surgeon: Elam Dutch, MD;  Location: [facility] OR;  Service: Vascular;  Laterality: Right;   LEFT HEART CATH AND CORONARY ANGIOGRAPHY N/A 10/23/2016   Procedure: LEFT HEART CATH AND CORONARY ANGIOGRAPHY;  Surgeon: Wellington Hampshire, MD;  Location: [place] CV LAB;  Service: Cardiovascular;  Laterality: N/A;   LIGATION OF ARTERIOVENOUS  FISTULA Right 05/12/2017    Procedure: BANDING OF RIGHT UPPER ARM ARTERIOVENOUS  FISTULA USING 6MM X 10CM GORE TEX VASCULAR GRAFT;  Surgeon: Angelia Mould, MD;  Location: [place];  Service: Vascular;  Laterality: Right;   LIGATION OF ARTERIOVENOUS  FISTULA Right 07/03/2017   Procedure: LIGATION OF ARTERIOVENOUS  FISTULA RIGHT ARM;  Surgeon: Angelia Mould, MD;  Location: [facility] OR;  Service: Vascular;  Laterality: Right;   TONSILLECTOMY      Allergies  No Known Allergies  History of Present Illness    78 year old male with above complex past medical history including CAD, paroxysmal atrial fibrillation (no anticoagulation in the setting of prior GI bleeding), chronic diastolic congestive heart failure, end-stage renal disease on hemodialysis, peripheral arterial disease, carotid arterial disease, anemia of chronic disease, hypertension, hyperlipidemia, and tobacco abuse.  In August 2018, he suffered a non-STEMI in the setting of GI bleed and anemia.  Diagnostic catheterization showed a chronic total occlusion of the right coronary artery with left-to-right collaterals as well as a chronic total occlusion of the left circumflex, which was a small vessel.  The LAD had a 70% stenosis in the midportion.  There were no good targets for bypass surgery and he subsequently underwent staged PCI and drug-eluting stent placement to the LAD in September 2018.  Echocardiogram at the time showed normal LV function.  He later developed recurrent GI bleeding on dual antiplatelet therapy and has since been on monotherapy with Plavix only.  In May 2019, he was admitted with chest pain and ruled out.  Echocardiogram showed normal LV function and there was initially concern related possible vegetations on his aortic valve and mitral valves however upon further evaluation, it was felt that the valves were heavily calcified and not significantly changed from prior echocardiograms.  Stress testing in May 2019 and again in July 2020 performed  in the setting of chest pain, specifically chest pain during dialysis in July 2020.  On both occasions, testing was nonischemic.  He was last seen in cardiology clinic in August of this year, at which time he noted some improvement in chest discomfort with dialysis.  Since then, he has continued to do well.  He denies experiencing any chest pain recently.  His midodrine dose has been increased to 10 mg twice a day on dialysis days.  He also hold his metoprolol and Imdur in the morning on dialysis days and with these changes, he has not been experiencing significant hypotension or lightheadedness with dialysis.  He continues to feel fatigued after dialysis however.  He ambulates some with a walker but overall, activity is limited.  He denies palpitations, dyspnea, PND, orthopnea, dizziness, syncope, edema, or early satiety.  Home Medications    Prior to Admission medications   Medication Sig Start Date End Date Taking? Authorizing Provider  allopurinol (ZYLOPRIM) 100 MG tablet Take 1 tablet (100 mg total) by mouth daily. 10/26/16  Yes Florencia Reasons, MD  atorvastatin (LIPITOR) 80 MG tablet TAKE 1 TABLET(80 MG) BY MOUTH DAILY AT 6 PM 11/30/18  Yes Rise Mu, PA-C  b complex vitamins tablet Take 1 tablet by mouth at bedtime.    Yes [provider]  calcitRIOL (ROCALTROL) 0.25 MCG capsule Take 1 capsule (0.25 mcg total) by mouth Every Tuesday,Thursday,and Saturday with dialysis. 12/05/16  Yes Bhagat, Bhavinkumar, PA  clopidogrel (PLAVIX) 75 MG tablet Take 1 tablet (75 mg total) by mouth daily. 08/03/18  Yes Wellington Hampshire, MD  ferric citrate (AURYXIA) 1 GM 210 MG(Fe) tablet Take 420 mg by mouth 2 (two) times daily.    Yes [provider]  isosorbide mononitrate (IMDUR) 30 MG 24 hr tablet Take 1 tablet (30 mg) by mouth once daily in the evening 07/29/18  Yes Wellington Hampshire, MD  metoprolol succinate (TOPROL-XL) 25 MG 24 hr tablet Take 1 tablet (25 mg) by mouth once daily on non-dialysis days  09/25/18  Yes Theora Gianotti, NP  midodrine (PROAMATINE) 10 MG tablet Take 1 tablet (10 mg total) by mouth 2 (two) times daily as needed (Twice daily on hemodialysis days). TWICE A DAY ONLY ON HEMODIALYSIS DAYS 10/23/18  Yes Wellington Hampshire, MD  nitroGLYCERIN (NITROSTAT) 0.4 MG SL tablet Place 1 tablet (0.4 mg total) under the tongue every 5 (five) minutes as needed for chest pain. 05/14/18  Yes Nat Christen, MD  pantoprazole (PROTONIX) 40 MG tablet Take 40 mg by mouth daily. 03/30/18  Yes [provider]  sucralfate (CARAFATE) 1 GM/10ML suspension Take 1 g by mouth 4 (four) times daily -  with meals and at bedtime.   Yes [provider]  tamsulosin (FLOMAX) 0.4 MG CAPS capsule Take 0.4 mg by mouth daily. 04/26/18  Yes [provider]    Review of Systems    Doing better following changes to his midodrine dosing.  He denies chest pain, palpitations, dyspnea, pnd, orthopnea, n, v, dizziness, syncope, edema, weight gain, or early satiety.  All other systems reviewed and are otherwise negative except as noted above.  Physical Exam    VS:  BP 110/60 (BP Location: Left Arm, Patient Position: Sitting, Cuff Size: Normal)    Pulse (!) 53    Ht 5\' 6"  (1.676 m)    Wt 185 lb (83.9 kg)    BMI 29.86 kg/m  , BMI Body mass index is 29.86 kg/m. GEN: Well nourished, well developed, in no acute distress. HEENT: normal. Neck: Supple, no JVD, carotid bruits, or masses. Cardiac: RRR, 2/6 blowing quality systolic murmur heard throughout, no rubs, or gallops. No clubbing, cyanosis, edema.  Radials/PT 1+ and equal bilaterally.  Respiratory:  Respirations regular and unlabored, clear to auscultation bilaterally. GI: Soft, nontender, nondistended, BS + x 4. MS: no deformity or atrophy. Skin: warm and dry, no rash. Neuro:  Strength and sensation are intact. Psych: Normal affect.  Accessory Clinical Findings    ECG personally reviewed by me today -sinus bradycardia, 53, first-degree  AV block - no acute changes.  Lab Results  Component Value Date   WBC 5.5 05/14/2018   HGB 10.4 (L) 05/14/2018   HCT 33.0 (L) 05/14/2018   MCV 105.4 (H) 05/14/2018   PLT 141 (L) 05/14/2018   Lab Results  Component Value Date   CREATININE 5.19 (H) 05/14/2018   BUN 21 05/14/2018   NA 137 05/14/2018   K 4.1 05/14/2018   CL 103 05/14/2018   CO2 23 05/14/2018   Lab Results  Component Value Date   ALT 16 (L) 07/26/2017   AST 25 07/26/2017   ALKPHOS 78 07/26/2017  BILITOT 0.7 07/26/2017   Lab Results  Component Value Date   CHOL 208 (H) 10/20/2016   HDL 23 (L) 10/20/2016   LDLCALC 140 (H) 10/20/2016   TRIG 225 (H) 10/20/2016   CHOLHDL 9.0 10/20/2016    Lab Results  Component Value Date   HGBA1C 4.9 10/19/2016    Assessment & Plan    1.  Coronary artery disease: Status post prior PCI and drug-eluting stent placement to the mid LAD in September 2018.  He remains on long-term Plavix therapy without any recent issues or evidence of GI bleeding.  He has not been experiencing chest pain during dialysis and had a nonischemic stress test in July.  He remains on statin, nitrate, beta-blocker.  2.  Essential hypertension: Blood pressure stable today.  He has not been experiencing significant issues with hypotension during dialysis following changes to midodrine therapy-now taking twice a day on dialysis days.  Remains on metoprolol and isosorbide.  3.  Hyperlipidemia: He has not had lipids in over 2 years.  He is fasting today and I will check lipids and LFTs.  Continue high potency statin therapy.  4.  Peripheral arterial disease: No claudication.  He remains on Plavix and statin therapy.  5.  Moderate bilateral carotid disease: He is due for follow-up as his previously scheduled ultrasound earlier this year was put off secondary to Covid.  I will arrange for him to have this done before the end of this year.  Continue statin and Plavix therapy.  6.  ESRD:  Tolerating HD better w/  changes to midodrine dosing.  Considering moving to [place].  7.  Disposition: Follow-up lipids and LFTs today.  Follow-up carotid ultrasound.  Follow-up in clinic in 4 to 6 months or sooner if necessary.   [name], NP 03/05/2019, 4:41 PM

## 2019-03-05 NOTE — Patient Instructions (Signed)
Medication Instructions:  Your physician recommends that you continue on your current medications as directed. Please refer to the Current Medication list given to you today.  *If you need a refill on your cardiac medications before your next appointment, please call your pharmacy*  Lab Work: Your physician recommends that you have lab work today(lipids, LFT)  If you have labs (blood work) drawn today and your tests are completely normal, you will receive your results only by: Marland Kitchen MyChart Message (if you have MyChart) OR . A paper copy in the mail If you have any lab test that is abnormal or we need to change your treatment, we will call you to review the results.  Testing/Procedures: 1- Your physician has requested that you have a carotid duplex. This test is an ultrasound of the carotid arteries in your neck. It looks at blood flow through these arteries that supply the brain with blood. Allow one hour for this exam. There are no restrictions or special instructions.    Follow-Up: At [facility], you and your health needs are our priority.  As part of our continuing mission to provide you with exceptional heart care, we have created designated Provider Care Teams.  These Care Teams include your primary Cardiologist (physician) and Advanced Practice Providers (APPs -  Physician Assistants and Nurse Practitioners) who all work together to provide you with the care you need, when you need it.  Your next appointment:   6 month(s)  The format for your next appointment:   In Person  Provider:    You may see [name], MD or [name], NP.

## 2019-03-06 DIAGNOSIS — N2581 Secondary hyperparathyroidism of renal origin: Secondary | ICD-10-CM | POA: Diagnosis not present

## 2019-03-06 DIAGNOSIS — N186 End stage renal disease: Secondary | ICD-10-CM | POA: Diagnosis not present

## 2019-03-06 DIAGNOSIS — A499 Bacterial infection, unspecified: Secondary | ICD-10-CM | POA: Diagnosis not present

## 2019-03-06 DIAGNOSIS — D689 Coagulation defect, unspecified: Secondary | ICD-10-CM | POA: Diagnosis not present

## 2019-03-06 DIAGNOSIS — Z992 Dependence on renal dialysis: Secondary | ICD-10-CM | POA: Diagnosis not present

## 2019-03-06 LAB — LIPID PANEL
Chol/HDL Ratio: 6 ratio — ABNORMAL HIGH (ref 0.0–5.0)
Cholesterol, Total: 198 mg/dL (ref 100–199)
HDL: 33 mg/dL — ABNORMAL LOW (ref >39)
LDL Chol Calc (NIH): 125 mg/dL — ABNORMAL HIGH (ref 0–99)
Triglycerides: 227 mg/dL — ABNORMAL HIGH (ref 0–149)
VLDL Cholesterol Cal: 40 mg/dL (ref 5–40)

## 2019-03-06 LAB — HEPATIC FUNCTION PANEL
ALT: 12 IU/L (ref 0–44)
AST: 18 IU/L (ref 0–40)
Albumin: 4.4 g/dL (ref 3.7–4.7)
Alkaline Phosphatase: 68 IU/L (ref 39–117)
Bilirubin Total: 0.3 mg/dL (ref 0.0–1.2)
Bilirubin, Direct: 0.08 mg/dL (ref 0.00–0.40)
Total Protein: 7.4 g/dL (ref 6.0–8.5)

## 2019-03-08 ENCOUNTER — Telehealth: Payer: Self-pay

## 2019-03-08 DIAGNOSIS — I2510 Atherosclerotic heart disease of native coronary artery without angina pectoris: Secondary | ICD-10-CM

## 2019-03-08 NOTE — Telephone Encounter (Signed)
-----   Message from Theora Gianotti, NP sent at 03/08/2019  8:40 AM EST ----- LDL is above goal.  He should be on lipitor 80mg  daily currently (listed in meds).  Assuming he is taking this as rx, we will need to add zetia 10mg  daily w/ plan to f/u lipids/lfts in ~ 6-8 wks.

## 2019-03-08 NOTE — Telephone Encounter (Signed)
Call to patient to review results. Spoke to daughter per DPR.   She will call to patient to verify he has been taking medications as ordered prior to adding new.   She will call me back.

## 2019-03-09 DIAGNOSIS — N186 End stage renal disease: Secondary | ICD-10-CM | POA: Diagnosis not present

## 2019-03-09 DIAGNOSIS — D689 Coagulation defect, unspecified: Secondary | ICD-10-CM | POA: Diagnosis not present

## 2019-03-09 DIAGNOSIS — N2581 Secondary hyperparathyroidism of renal origin: Secondary | ICD-10-CM | POA: Diagnosis not present

## 2019-03-09 DIAGNOSIS — A499 Bacterial infection, unspecified: Secondary | ICD-10-CM | POA: Diagnosis not present

## 2019-03-09 DIAGNOSIS — Z992 Dependence on renal dialysis: Secondary | ICD-10-CM | POA: Diagnosis not present

## 2019-03-09 MED ORDER — EZETIMIBE 10 MG PO TABS: 10.0000 mg | ORAL_TABLET | Freq: Every day | ORAL | 3 refills | Status: DC

## 2019-03-09 NOTE — Telephone Encounter (Signed)
Incoming call back from patients daughter. He has been taking medication as ordered and she would like to proceed with plan of care.  rx sent in for zetia.  [facility]   Advised pt to call for any further questions or concerns.

## 2019-03-10 ENCOUNTER — Other Ambulatory Visit: Payer: Self-pay | Admitting: Nurse Practitioner

## 2019-03-10 DIAGNOSIS — I6523 Occlusion and stenosis of bilateral carotid arteries: Secondary | ICD-10-CM

## 2019-03-11 DIAGNOSIS — N186 End stage renal disease: Secondary | ICD-10-CM | POA: Diagnosis not present

## 2019-03-11 DIAGNOSIS — A499 Bacterial infection, unspecified: Secondary | ICD-10-CM | POA: Diagnosis not present

## 2019-03-11 DIAGNOSIS — D689 Coagulation defect, unspecified: Secondary | ICD-10-CM | POA: Diagnosis not present

## 2019-03-11 DIAGNOSIS — Z992 Dependence on renal dialysis: Secondary | ICD-10-CM | POA: Diagnosis not present

## 2019-03-11 DIAGNOSIS — N2581 Secondary hyperparathyroidism of renal origin: Secondary | ICD-10-CM | POA: Diagnosis not present

## 2019-03-14 DIAGNOSIS — D689 Coagulation defect, unspecified: Secondary | ICD-10-CM | POA: Diagnosis not present

## 2019-03-14 DIAGNOSIS — A499 Bacterial infection, unspecified: Secondary | ICD-10-CM | POA: Diagnosis not present

## 2019-03-14 DIAGNOSIS — N2581 Secondary hyperparathyroidism of renal origin: Secondary | ICD-10-CM | POA: Diagnosis not present

## 2019-03-14 DIAGNOSIS — Z992 Dependence on renal dialysis: Secondary | ICD-10-CM | POA: Diagnosis not present

## 2019-03-14 DIAGNOSIS — N186 End stage renal disease: Secondary | ICD-10-CM | POA: Diagnosis not present

## 2019-03-16 DIAGNOSIS — A499 Bacterial infection, unspecified: Secondary | ICD-10-CM | POA: Diagnosis not present

## 2019-03-16 DIAGNOSIS — N186 End stage renal disease: Secondary | ICD-10-CM | POA: Diagnosis not present

## 2019-03-16 DIAGNOSIS — N2581 Secondary hyperparathyroidism of renal origin: Secondary | ICD-10-CM | POA: Diagnosis not present

## 2019-03-16 DIAGNOSIS — Z992 Dependence on renal dialysis: Secondary | ICD-10-CM | POA: Diagnosis not present

## 2019-03-16 DIAGNOSIS — D689 Coagulation defect, unspecified: Secondary | ICD-10-CM | POA: Diagnosis not present

## 2019-03-17 ENCOUNTER — Ambulatory Visit (INDEPENDENT_AMBULATORY_CARE_PROVIDER_SITE_OTHER): Payer: Medicare Other

## 2019-03-17 ENCOUNTER — Other Ambulatory Visit: Payer: Self-pay

## 2019-03-17 DIAGNOSIS — I6523 Occlusion and stenosis of bilateral carotid arteries: Secondary | ICD-10-CM

## 2019-03-18 DIAGNOSIS — I129 Hypertensive chronic kidney disease with stage 1 through stage 4 chronic kidney disease, or unspecified chronic kidney disease: Secondary | ICD-10-CM | POA: Diagnosis not present

## 2019-03-18 DIAGNOSIS — D689 Coagulation defect, unspecified: Secondary | ICD-10-CM | POA: Diagnosis not present

## 2019-03-18 DIAGNOSIS — N186 End stage renal disease: Secondary | ICD-10-CM | POA: Diagnosis not present

## 2019-03-18 DIAGNOSIS — N2581 Secondary hyperparathyroidism of renal origin: Secondary | ICD-10-CM | POA: Diagnosis not present

## 2019-03-18 DIAGNOSIS — A499 Bacterial infection, unspecified: Secondary | ICD-10-CM | POA: Diagnosis not present

## 2019-03-18 DIAGNOSIS — Z992 Dependence on renal dialysis: Secondary | ICD-10-CM | POA: Diagnosis not present

## 2019-03-22 ENCOUNTER — Telehealth: Payer: Self-pay

## 2019-03-22 NOTE — Telephone Encounter (Signed)
Call to patient to discuss u/s results. Spoke to daughter, okay per DPR>   No further questions or orders at this time.   Advised pt to call for any further questions or concerns.

## 2019-03-22 NOTE — Telephone Encounter (Signed)
-----   Message from Theora Gianotti, NP sent at 03/18/2019 11:57 AM EST ----- Stable bilateral carotid disease. F/u in 1 year.

## 2019-03-31 ENCOUNTER — Other Ambulatory Visit (HOSPITAL_COMMUNITY): Payer: Self-pay | Admitting: Nurse Practitioner

## 2019-03-31 DIAGNOSIS — I6523 Occlusion and stenosis of bilateral carotid arteries: Secondary | ICD-10-CM

## 2019-04-16 IMAGING — CT CT ABD-PELV W/O CM
2 of 4 series · 16 of 46 positions shown, 18 images · non-contrast
Comparison: Abdominal CT dated 04/26/2009

CLINICAL DATA: 75-year-old male with abdominal pain.

EXAM:
CT ABDOMEN AND PELVIS WITHOUT CONTRAST
TECHNIQUE: Multidetector CT imaging of the abdomen and pelvis was performed
following the standard protocol without IV contrast.

[Series 2: abd/pel w/o · axial · non-contrast · 0.85mm/px · z∈[-433,-58]mm · 13 of 87 slices shown, 15 images]
[im 6/87  soft-tissue]
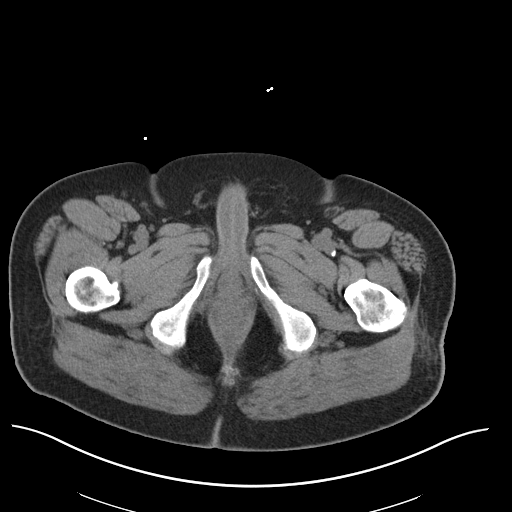
[im 6/87  bone]
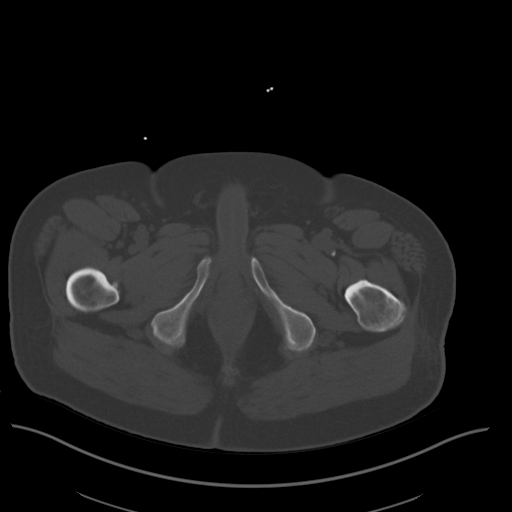
[im 11/87  soft-tissue]
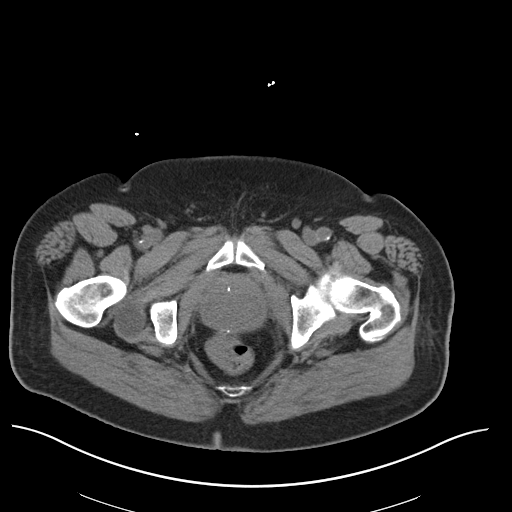
[im 21/87  soft-tissue]
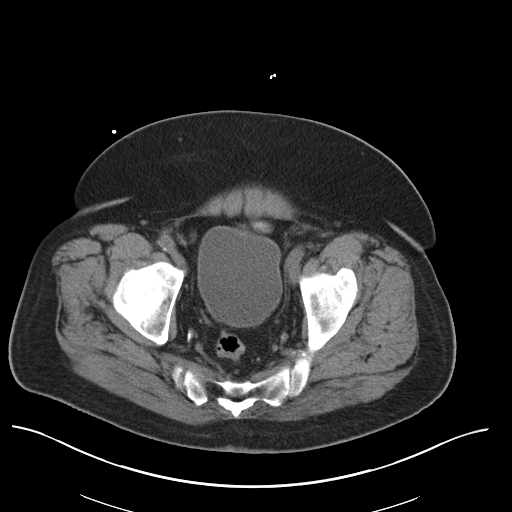
[im 26/87  soft-tissue]
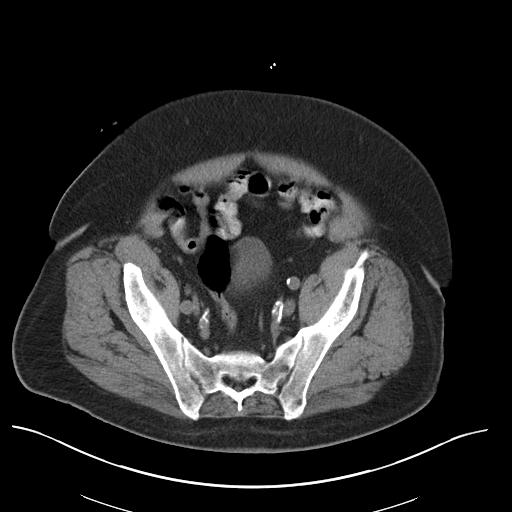
[im 31/87  soft-tissue]
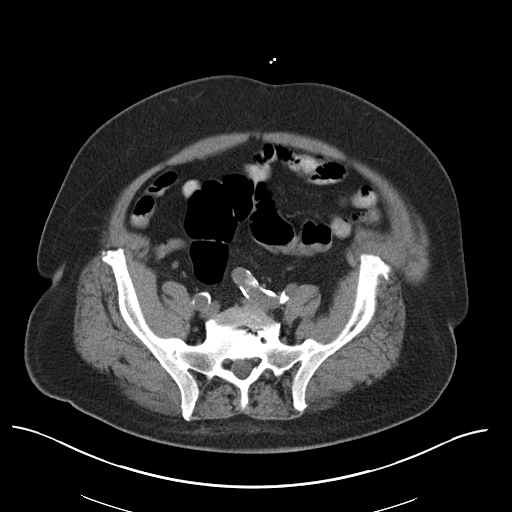
[im 36/87  soft-tissue]
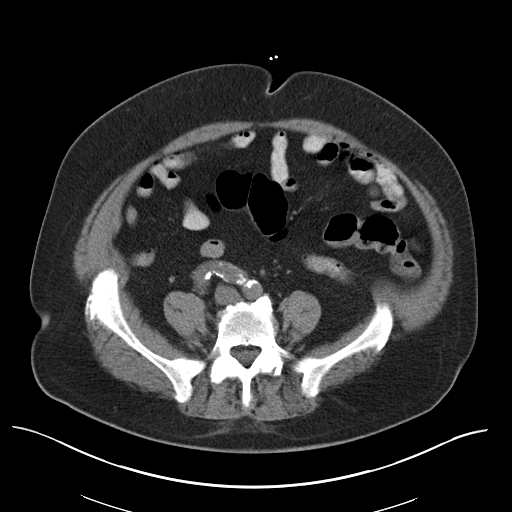
[im 46/87  soft-tissue]
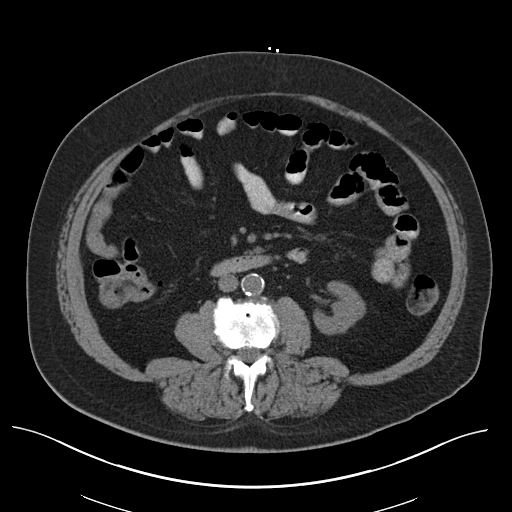
[im 51/87  soft-tissue]
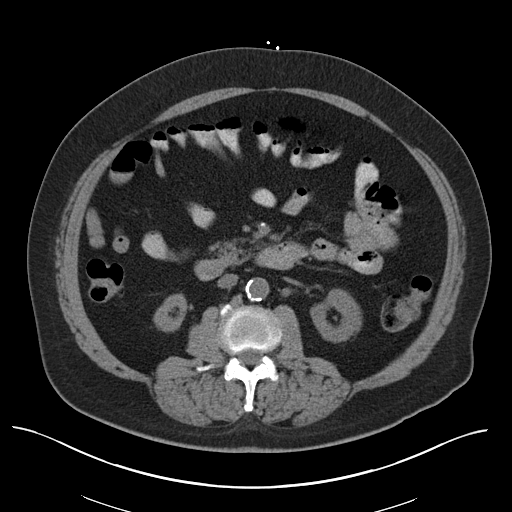
[im 56/87  soft-tissue]
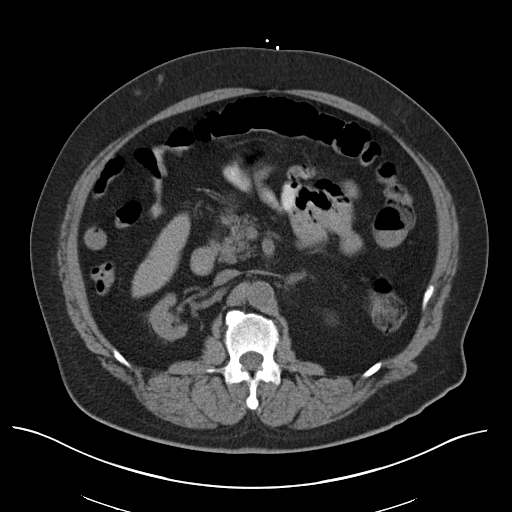
[im 56/87  bone]
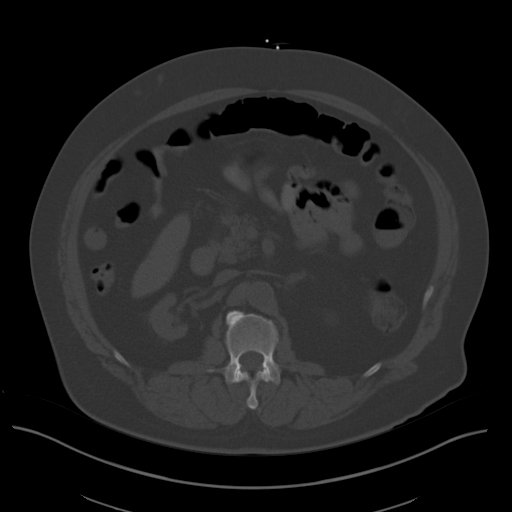
[im 61/87  soft-tissue]
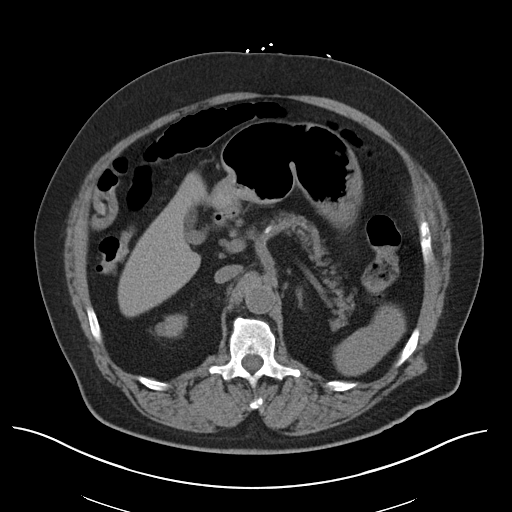
[im 66/87  soft-tissue]
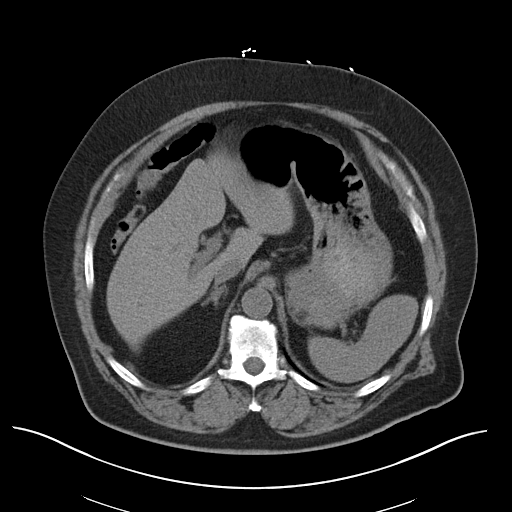
[im 76/87  soft-tissue]
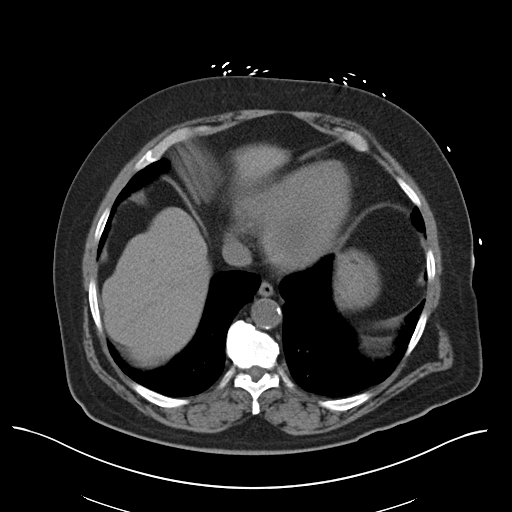
[im 81/87  soft-tissue]
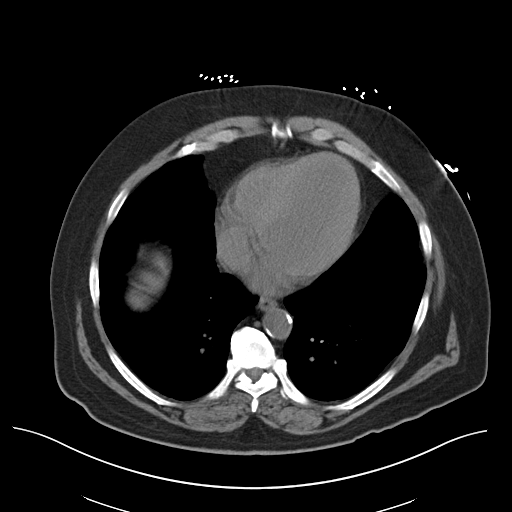

[Series 5: coronal · coronal · 0.80mm/px · 3 of 165 slices shown]
[im 55/165  soft-tissue]
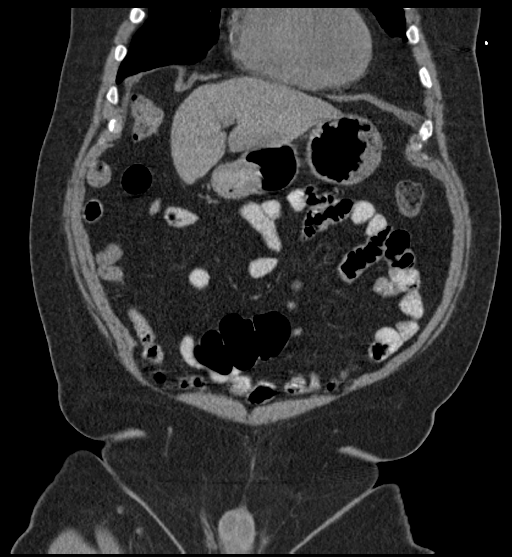
[im 73/165  soft-tissue]
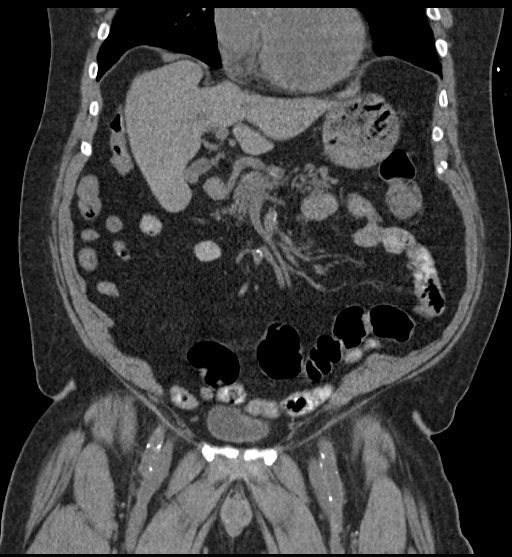
[im 92/165  soft-tissue]
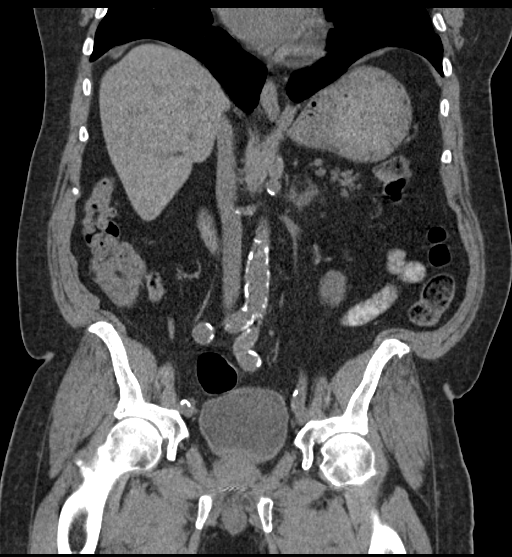

[16 of 46 positions shown; findings below may reference images not displayed]

FINDINGS: Evaluation of this exam is limited in the absence of intravenous
contrast.

Lower chest: The visualized lung bases are clear. There is coronary
vascular calcification. There is hypoattenuation of the cardiac
blood pool suggestive of a degree of anemia. Clinical correlation is
recommended.

No intra-abdominal free air or free fluid.

Hepatobiliary: No focal liver abnormality is seen. No gallstones,
gallbladder wall thickening, or biliary dilatation.

Pancreas: Unremarkable. No pancreatic ductal dilatation or
surrounding inflammatory changes.

Spleen: Focal prominence of the superior pole of the spleen (series
2, image 16 and coronal series 5, image 124) is not well
characterized and may be related to splenic morphology or represent
a splenic lesion. This is new compared to the prior CT of 3588. CT
with IV contrast or MRI may provide better characterization if
clinically indicated.

Adrenals/Urinary Tract: The adrenal glands are unremarkable. There
is moderate bilateral renal atrophy, right greater than left.
Subcentimeter right renal exophytic hypodense lesions are not
characterized but likely represent cysts. There is no hydronephrosis
or nephrolithiasis on either side. The visualized ureters appear
unremarkable. The urinary bladder is only partially distended and is
grossly unremarkable.

Stomach/Bowel: There are small scattered colonic as well as terminal
ileum diverticula without active inflammatory changes. There is no
evidence of bowel obstruction or active inflammation. Normal
appendix.

Vascular/Lymphatic: There is moderate aortoiliac atherosclerotic
disease. Evaluation of the vasculature is limited in the absence of
intravenous contrast. No portal venous gas identified. There is no
adenopathy.

Reproductive: The prostate gland is enlarged measuring approximately
5.8 cm in transverse diameter (previously 7.2 cm). Multiple metallic
prostate biopsy clips noted.

Other: None

Musculoskeletal: There is multilevel degenerative changes of the
spine. No acute osseous pathology.
IMPRESSION: 1. No acute intra-abdominal or pelvic pathology. No bowel
obstruction or active inflammation. Normal appendix.
2. Small scattered colonic and terminal ileum diverticulum without
active inflammation.
3. Focal lobulated prominence of the superior pole of the spleen,
incompletely characterized. CT with IV contrast or MRI may provide
better characterization if clinically indicated.
4. Bilateral renal atrophy.  No hydronephrosis or nephrolithiasis.
5.  Aortic Atherosclerosis ([id number]).

## 2019-04-16 IMAGING — CR DG CHEST 2V
2 series · 2 of 2 positions shown · non-contrast
Comparison: Chest CT dated 05/05/2009. Chest radiographs dated
07/12/2004.

CLINICAL DATA: Gastroesophageal reflux. Belching after eating.
Smoker. Prostate cancer.

EXAM:
CHEST  2 VIEW

[w chest pa]
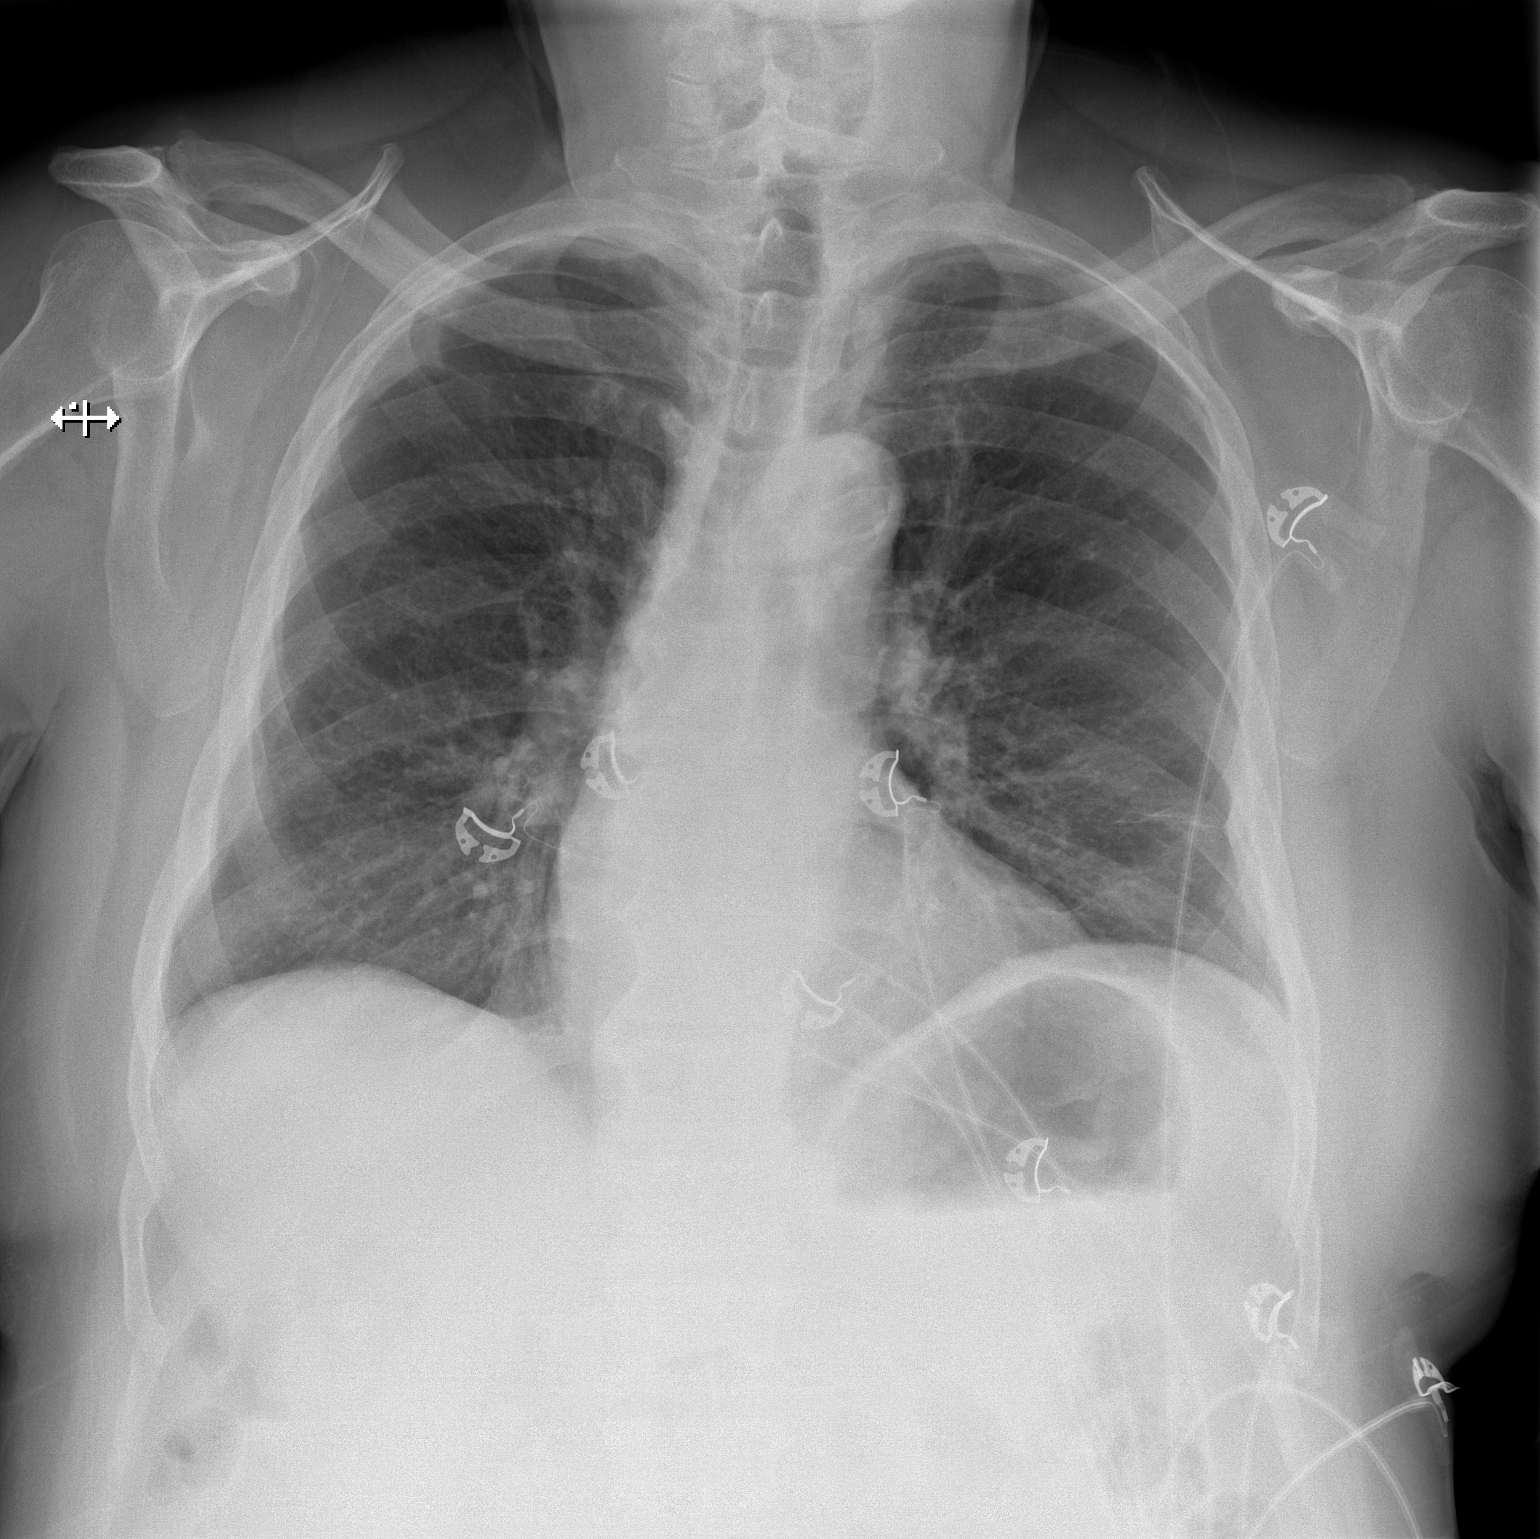

[w chest lat]
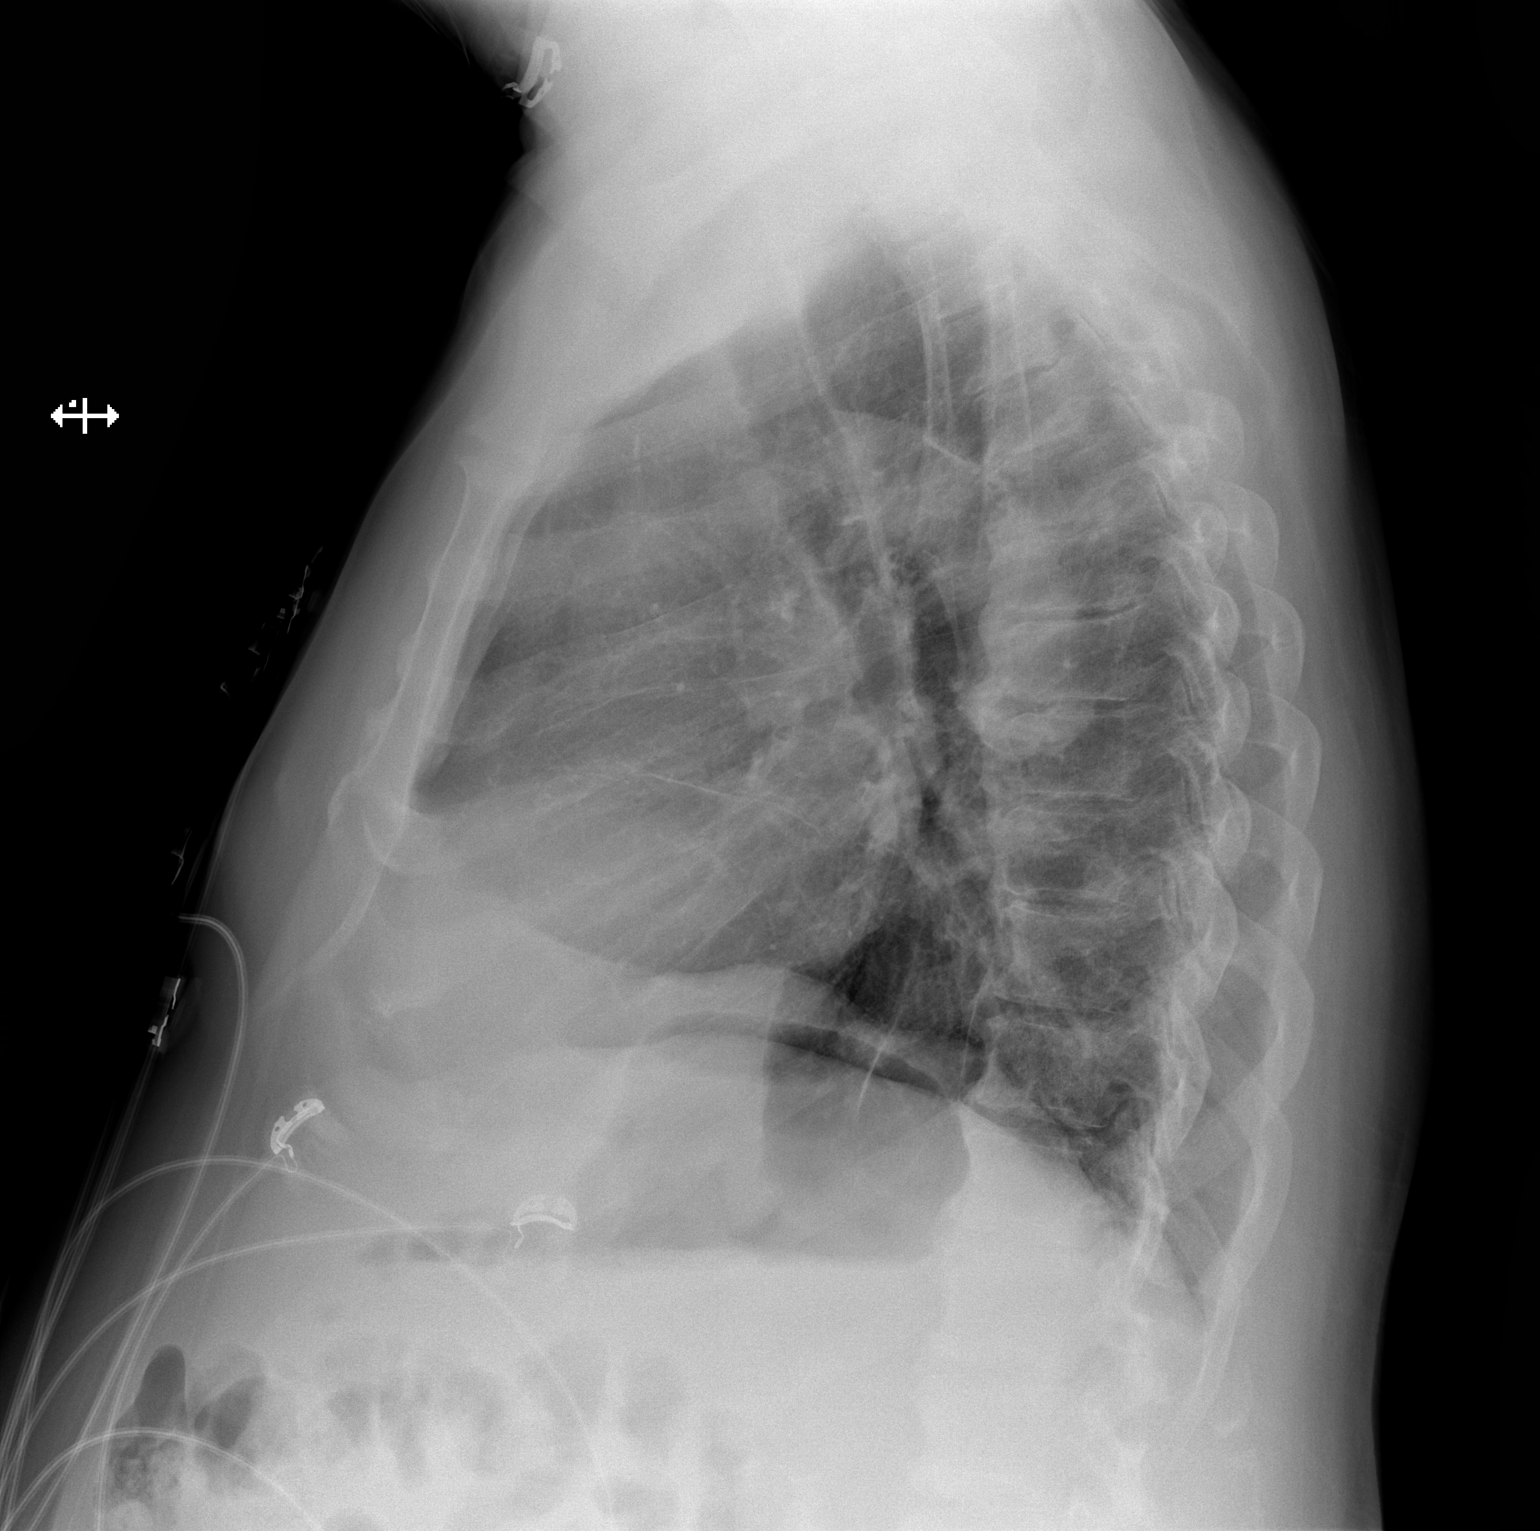

[2 of 2 positions shown; findings below may reference images not displayed]

FINDINGS: Normal sized heart. Stable minimal linear scarring at the left lung
base. Otherwise, clear lungs. Aortic arch calcifications. Thoracic
spine degenerative changes.
IMPRESSION: No acute abnormality.

## 2019-05-11 ENCOUNTER — Other Ambulatory Visit: Payer: Self-pay | Admitting: Gastroenterology

## 2019-05-11 DIAGNOSIS — D7389 Other diseases of spleen: Secondary | ICD-10-CM

## 2019-05-12 ENCOUNTER — Other Ambulatory Visit
Admission: RE | Admit: 2019-05-12 | Discharge: 2019-05-12 | Disposition: A | Discharge status: Home / Self Care | Payer: Medicare Other | Source: Ambulatory Visit | Attending: Nurse Practitioner | Admitting: Nurse Practitioner

## 2019-05-12 DIAGNOSIS — I2510 Atherosclerotic heart disease of native coronary artery without angina pectoris: Secondary | ICD-10-CM | POA: Diagnosis present

## 2019-05-12 LAB — LIPID PANEL
Cholesterol: 131 mg/dL (ref 0–200)
HDL: 31 mg/dL — ABNORMAL LOW (ref >40)
LDL Cholesterol: 68 mg/dL (ref 0–99)
Total CHOL/HDL Ratio: 4.2 RATIO
Triglycerides: 161 mg/dL — ABNORMAL HIGH (ref <150)
VLDL: 32 mg/dL (ref 0–40)

## 2019-05-12 LAB — HEPATIC FUNCTION PANEL
ALT: 21 U/L (ref 0–44)
AST: 37 U/L (ref 15–41)
Albumin: 4.2 g/dL (ref 3.5–5.0)
Alkaline Phosphatase: 64 U/L (ref 38–126)
Bilirubin, Direct: 0.4 mg/dL — ABNORMAL HIGH (ref 0.0–0.2)
Indirect Bilirubin: 0.7 mg/dL (ref 0.3–0.9)
Total Bilirubin: 1.1 mg/dL (ref 0.3–1.2)
Total Protein: 8.5 g/dL — ABNORMAL HIGH (ref 6.5–8.1)

## 2019-06-02 ENCOUNTER — Other Ambulatory Visit: Payer: Self-pay

## 2019-06-02 ENCOUNTER — Ambulatory Visit
Admission: RE | Admit: 2019-06-02 | Discharge: 2019-06-02 | Disposition: A | Discharge status: Home / Self Care | Payer: Medicare Other | Source: Ambulatory Visit | Attending: Gastroenterology | Admitting: Gastroenterology

## 2019-06-02 DIAGNOSIS — D7389 Other diseases of spleen: Secondary | ICD-10-CM

## 2019-07-07 ENCOUNTER — Other Ambulatory Visit: Payer: Self-pay | Admitting: *Deleted

## 2019-07-07 MED ORDER — METOPROLOL SUCCINATE ER 25 MG PO TB24: ORAL_TABLET | ORAL | 3 refills | Status: DC

## 2019-09-22 NOTE — Progress Notes (Signed)
Cardiology Office Note    Date:  09/24/2019   ID:  Roger [name], DOB 03/17/1941, MRN [id number]  PCP:  Lujean Amel, MD  Cardiologist:  [name], MD  Electrophysiologist:  None   Chief Complaint: Follow-up  History of Present Illness:   Roger [name] is a 79 y.o. male with history of CAD status post PCI/DES to the LAD in 2018, PAF not on [place] secondary to history of GI bleeding, HFpEF, ESRD on HD (TTS) since 10/2016, PAD, carotid artery disease, anemia of chronic disease, prostate cancer, HTN, HLD, and tobacco use who presents for follow-up of his CAD.  He was admitted to the hospital in 10/2016 with NSTEMI in the setting of GI bleed with anemia.  Diagnostic LHC showed CTO of the RCA with left-to-right collaterals as well as a CTO of the LCx which was a small vessel.  The mid LAD had a 70% stenosis.  There were no good targets for bypass surgery, and in this setting, he subsequently underwent staged PCI/DES to the LAD in 11/2016.  Echo at that time showed normal LV systolic function.  He later developed recurrent GI bleeding on DAPT and has been on monotherapy with Plavix only since.  He was admitted to the hospital in 07/2017 with chest pain, ruling out.  Echo showed normal LV systolic function.  Initially, there was concern for possible vegetations on his aortic valve and mitral valve however upon further evaluation it was felt the valves were heavily calcified and not significantly changed from prior echoes.  Stress testing in 07/2017 and again in 09/2018, performed in the setting of chest pain were nonischemic.  The chest pain as noted in 09/2018 was during dialysis.  He was last seen in the office in 02/2019 and was doing well, without chest pain.  His midodrine has been increased to 10 mg twice daily on dialysis days.  He was also having to hold metoprolol and Imdur on the morning of dialysis, and with these changes he had not been experiencing significant hypotension or lightheadedness with  HD.  He did continue to note significant fatigue following HD.  Carotid artery ultrasound in 02/2019 showed stable bilateral ICA 40 to 59% stenosis and antegrade flow in the bilateral vertebral arteries.  He was referred to the [facility] kidney transplant program earlier this year and deemed to not be a candidate for transplant due to age, comorbid conditions, and lack of a living donor.  He comes in accompanied by his daughter today.  Since he was last seen, he notes an increase in chest pain and shortness of breath occurring with hemodialysis.  With this, he is having episodes of hypotension with systolic blood pressure dropping into the 25K systolic.  He is now intermittently requiring supplemental oxygen during hemodialysis typically about once per week.  He continues to have abdominal spasms with his hemodialysis of uncertain etiology.  No lower extremity swelling, abdominal distention, or apnea.  He denies any hematochezia or melena.   Labs independently reviewed: 04/2019 -TC 131, TG 161, HDL 31, LDL 68, albumin 4.2, AST/ALT normal 09/2018 - Hgb 12.2, PLT 203  Past Medical History:  Diagnosis Date  . Anemia   . Anginal pain ([place])   . Arthritis    "all over; bad in the legs" (12/04/2016)  . CAD (coronary artery disease)    a. NSTEMI in setting of anemia; b. 10/2016 MV: reversible ant, apical, inflat defect; c. 10/2016 Cath: LM nl, LAD 40p, 77m D1 70, D2  80, RI min irregs, LCX 100ost, RCA 100p, 142md, RPDA fills via collats from LAD, RPAV small-->Initial conservative Rx in setting of GIB w/ plan for PCI LAD if H/H stable on ASA/Plavix;  d. 12/04/16 s/p PTCA and DES to mLAD; e. 09/2018 MV: EF 54%, no ischemia.  . Carotid disease, bilateral ([place]    a. 04/2017 U/S: RICA 440-98 RCCA >>11 LICA 491-47  .Marland KitchenDepression    situational   . Diastolic dysfunction    a. 10/2016 Echo: EF 55-60%, no rwma, Gr2 DD, triv MR, mildly dil LA; b. 07/2017 Echo: EF 60-65%, Gr1 DD. No rwma. AoV leaflet thickening (not felt to be  SBE upon further review). Mild MR. Mildly dil LA.  .Marland KitchenESRD on dialysis ([facility]    "[place]; [address]; TTS usually; [place] [facility] [place] staying w/daughter" (12/04/2016)  . GERD (gastroesophageal reflux disease)   . GIB (gastrointestinal bleeding) 10/2011   a. 10/2016 3u PRBCs - EGD/Colonoscopy: angiodysplasia w/ diverticulosis. No apparent bleeding. Polypecotmy->tubular adenomas.  . Gout   . Headache    no migraines for years  . Heart murmur   . High cholesterol   . History of blood transfusion 10/18/2016   "related to anemia" (12/04/2016)  . History of kidney stones    years ago  . Hypertension   . Iron deficiency anemia   . Myocardial infarction ([place] 10/18/2016  . Old MI (myocardial infarction)    "found evidence of this on 10/18/2016"  . PAF (paroxysmal atrial fibrillation) ([place]   . Peripheral vascular disease ([place]    BLE  . Prostate cancer ([place]    a. s/p seed implantation.  . Tobacco abuse     Past Surgical History:  Procedure Laterality Date  . AV FISTULA PLACEMENT Right 03/28/2017   Procedure: ARTERIOVENOUS (AV) BRACHIOCEPHALIC FISTULA CREATION;  Surgeon: DAngelia Mould MD;  [place]  Service: Vascular;  Laterality: Right;  . CARDIAC CATHETERIZATION  10/2016  . COLONOSCOPY WITH PROPOFOL N/A 10/21/2016   Procedure: COLONOSCOPY WITH PROPOFOL;  Surgeon: GGatha Mayer MD;  [facility]  Service: Endoscopy;  Laterality: N/A;  . CORONARY ANGIOPLASTY WITH STENT PLACEMENT  12/04/2016   "1 stent"  . CORONARY STENT INTERVENTION N/A 12/04/2016   Procedure: CORONARY STENT INTERVENTION;  Surgeon: AWellington Hampshire MD;  [place];  Service: Cardiovascular;  Laterality: N/A;  . ESOPHAGOGASTRODUODENOSCOPY N/A 10/21/2016   Procedure: ESOPHAGOGASTRODUODENOSCOPY (EGD);  Surgeon: GGatha Mayer MD;  [facility];  Service: Endoscopy;  Laterality: N/A;  . INSERTION OF DIALYSIS CATHETER Right 10/22/2016    Procedure: INSERTION OF TUNNELED DIALYSIS CATHETER;  Surgeon: FElam Dutch MD;  [place]  Service: Vascular;  Laterality: Right;  . LEFT HEART CATH AND CORONARY ANGIOGRAPHY N/A 10/23/2016   Procedure: LEFT HEART CATH AND CORONARY ANGIOGRAPHY;  Surgeon: AWellington Hampshire MD;  [facility];  Service: Cardiovascular;  Laterality: N/A;  . LIGATION OF ARTERIOVENOUS  FISTULA Right 05/12/2017   Procedure: BANDING OF RIGHT UPPER ARM ARTERIOVENOUS  FISTULA USING 6MM X 10CM GORE TEX VASCULAR GRAFT;  Surgeon: DAngelia Mould MD;  [place]  Service: Vascular;  Laterality: Right;  . LIGATION OF ARTERIOVENOUS  FISTULA Right 07/03/2017   Procedure: LIGATION OF ARTERIOVENOUS  FISTULA RIGHT ARM;  Surgeon: DAngelia Mould MD;  [place] [facility]  Service: Vascular;  Laterality: Right;  . TONSILLECTOMY      Current Medications: Current Meds  Medication Sig  . allopurinol (ZYLOPRIM) 100 MG tablet Take 1 tablet (100 mg total) by mouth daily.  Marland Kitchen atorvastatin (LIPITOR) 80 MG tablet TAKE 1 TABLET(80 MG) BY MOUTH DAILY AT 6 PM  . b complex vitamins tablet Take 1 tablet by mouth at bedtime.   . calcitRIOL (ROCALTROL) 0.25 MCG capsule Take 1 capsule (0.25 mcg total) by mouth Every Tuesday,Thursday,and Saturday with dialysis.  Marland Kitchen clopidogrel (PLAVIX) 75 MG tablet Take 1 tablet (75 mg total) by mouth daily.  . ferric citrate (AURYXIA) 1 GM 210 MG(Fe) tablet Take 420 mg by mouth 2 (two) times daily.   . isosorbide mononitrate (IMDUR) 30 MG 24 hr tablet Take 1 tablet (30 mg) by mouth once daily in the evening  . metoprolol succinate (TOPROL-XL) 25 MG 24 hr tablet Take 1 tablet (25 mg) by mouth once daily on non-dialysis days  . midodrine (PROAMATINE) 10 MG tablet Take 1 tablet (10 mg total) by mouth 2 (two) times daily as needed (Twice daily on hemodialysis days). TWICE A DAY ONLY ON HEMODIALYSIS DAYS (Patient taking differently: Take 10 mg by mouth 2 (two) times daily. TWICE A  DAY ONLY ON HEMODIALYSIS DAYS)  . nitroGLYCERIN (NITROSTAT) 0.4 MG SL tablet Place 1 tablet (0.4 mg total) under the tongue every 5 (five) minutes as needed for chest pain.  . pantoprazole (PROTONIX) 40 MG tablet Take 40 mg by mouth daily.  . sucralfate (CARAFATE) 1 GM/10ML suspension Take 1 g by mouth 2 (two) times daily.   . tamsulosin (FLOMAX) 0.4 MG CAPS capsule Take 0.4 mg by mouth daily.    Allergies:   Patient has no known allergies.   Social History   Socioeconomic History  . Marital status: Widowed    Spouse name: Not on file  . Number of children: Not on file  . Years of education: Not on file  . Highest education level: Not on file  Occupational History  . Not on file  Tobacco Use  . Smoking status: Former Smoker    Packs/day: 0.50    Years: 50.00    Pack years: 25.00    Types: Cigarettes    Quit date: 10/18/2016    Years since quitting: 2.9  . Smokeless tobacco: Never Used  Vaping Use  . Vaping Use: Former  Substance and Sexual Activity  . Alcohol use: No  . Drug use: No  . Sexual activity: Never    Birth control/protection: Abstinence  Other Topics Concern  . Not on file  Social History Narrative  . Not on file   Social Determinants of Health   Financial Resource Strain:   . Difficulty of Paying Living Expenses:   Food Insecurity:   . Worried About Charity fundraiser in the Last Year:   . Arboriculturist in the Last Year:   Transportation Needs:   . Film/video editor (Medical):   Marland Kitchen Lack of Transportation (Non-Medical):   Physical Activity:   . Days of Exercise per Week:   . Minutes of Exercise per Session:   Stress:   . Feeling of Stress :   Social Connections:   . Frequency of Communication with Friends and Family:   . Frequency of Social Gatherings with Friends and Family:   . Attends Religious Services:   . Active Member of Clubs or Organizations:   . Attends Archivist Meetings:   Marland Kitchen Marital Status:      Family History:    The patient's family history includes  Cancer in his brother, father, mother, and sister; Diabetes in his brother and father; Heart attack in his father; Hypertension in his brother, daughter, and father.  ROS:   Review of Systems  Constitutional: Positive for malaise/fatigue. Negative for chills, diaphoresis, fever and weight loss.  HENT: Negative for congestion.   Eyes: Negative for discharge and redness.  Respiratory: Positive for shortness of breath. Negative for cough, sputum production and wheezing.   Cardiovascular: Positive for chest pain. Negative for palpitations, orthopnea, claudication, leg swelling and PND.  Gastrointestinal: Negative for abdominal pain, blood in stool, heartburn, melena, nausea and vomiting.  Musculoskeletal: Negative for falls and myalgias.  Skin: Negative for rash.  Neurological: Positive for weakness. Negative for dizziness, tingling, tremors, sensory change, speech change, focal weakness and loss of consciousness.  Endo/Heme/Allergies: Does not bruise/bleed easily.  Psychiatric/Behavioral: Negative for substance abuse. The patient is not nervous/anxious.   All other systems reviewed and are negative.    EKGs/Labs/Other Studies Reviewed:    Studies reviewed were summarized above. The additional studies were reviewed today:  Lexiscan MPI 09/2018: Pharmacological myocardial perfusion imaging study with no significant  ischemia EF estimated at 54% Inferior wall hypokinesis, likely secondary to GI uptake artifact/attenuation correction processing No EKG changes concerning for ischemia at peak stress or in recovery. CT attenuation images with three-vessel coronary calcification and mild to moderate diffuse aortic atherosclerosis Low risk scan __________  2D echo 07/2017: - Procedure narrative: Transthoracic echocardiography. Image  quality was suboptimal. Technically difficult study.  - Left ventricle: The cavity size was normal. Wall thickness was   increased in a pattern of mild LVH. Systolic function was normal.  The estimated ejection fraction was in the range of 60% to 65%.  Wall motion was normal; there were no regional wall motion  abnormalities. Doppler parameters are consistent with abnormal  left ventricular relaxation (grade 1 diastolic dysfunction). The  E/e&' ratio is between 8-15, suggesting indeterminate LV filling  pressure.  - Aortic valve: Thickening and increased echogenicity of the  leaflet tips, consistent with calcification +/- endocarditis.  There is also a mass, more clearly noted in off-axis views of the  valve - this seems to move with the leaflet and likely is a  vegetation given the clinical scenario, although there is no  significant stenosis - which would be expected given some of the  measurements obtained. Trivial regurgitation. Further evaluation  with TEE is recommended.  - Mitral valve: MAC with thickened leaflets. There is a mobile  structure associated with the subvalvular apparatus in the LV,  which may be a partially flail cord. There is a mass on the  anterior leaflet which likely represents endocarditis. Further  evaluation with TEE is recommended. There is mild regurgitation.  There was mild regurgitation.  - Left atrium: The atrium was mildly dilated.  - Inferior vena cava: The vessel was normal in size. The  respirophasic diameter changes were in the normal range (= 50%),  consistent with normal central venous pressure.   Impressions:   - Technically difficult study, although extensive effort to  visualize aortic and mitral valves. There are likely vegetations  of both valves given the clinical scenario, consistent with  endocarditis. LVEF 60-65%. TEE is recommended to confirm the  diagnosis. There may be a partially flail subvalvular cord of the  mitral apparatus.  __________  LHC 11/2016:  Ost Cx to Prox Cx lesion, 100  %stenosed.  Prox RCA lesion, 100 %stenosed.  Prox LAD lesion, 40 %stenosed.  Colon Flattery  1st Diag to 1st Diag lesion, 70 %stenosed.  Mid RCA to Dist RCA lesion, 100 %stenosed.  Mid LAD lesion, 80 %stenosed.  Post intervention, there is a 0% residual stenosis.  A stent was successfully placed.  Ost 2nd Diag to 2nd Diag lesion, 60 %stenosed.   Successful angioplasty and drug-eluting stent placement to the mid LAD.  Recommendations: Dual antiplatelet therapy for at least 6 months. Aggressive treatment of risk factors. The patient's dialysis schedule is Tuesdays, Thursdays and Saturdays.  The patient seems to have significant disease affecting the right common femoral artery. Recommend an outpatient lower extremity arterial Doppler. __________  LHC 10/2016:  Ost Cx to Prox Cx lesion, 100 %stenosed.  Prox RCA lesion, 100 %stenosed.  Prox LAD lesion, 40 %stenosed.  Mid LAD lesion, 70 %stenosed.  Ost 1st Diag to 1st Diag lesion, 70 %stenosed.  Ost 2nd Diag to 2nd Diag lesion, 80 %stenosed.  Mid RCA to Dist RCA lesion, 100 %stenosed.   1. Significant three-vessel coronary artery disease. Chronically occluded right coronary artery with left to right collaterals supplying the right PDA. Chronically occluded proximal left circumflex which seems to be a small vessel overall with some left to left collaterals. Most of the lateral wall seems to be supplied by a large ramus branch. The LAD has a discrete 70% stenosis in a tortuous midsegment . The coronary arteries are overall moderately calcified and extremely tortuous likely due to hypertensive heart disease.  2. Moderately elevated left ventricular end-diastolic pressure. Left ventricular angiography was not performed due to kidney disease. EF was normal by echo.  Recommendations: I don't think there woud be significant advantage from CABG given that the disease in the LAD does not seem to be critical and the only other graftable vessel  would be the right PDA. The OMs seem to be very small and diffusely diseased. The best option is probably to treat medically for now and consider mid LAD PCI in the near future (few months) depending on his clinical course and after making sure he recovers from current illnesses including GI bleed and renal failure.   EKG:  EKG is ordered today.  The EKG ordered today demonstrates NSR, 62 bpm, normal axis, first-degree AV block, no acute ST-T changes, no significant changes when compared to prior tracing  Recent Labs: 05/12/2019: ALT 21  Recent Lipid Panel    Component Value Date/Time   CHOL 131 05/12/2019 1134   CHOL 198 03/05/2019 1526   TRIG 161 (H) 05/12/2019 1134   HDL 31 (L) 05/12/2019 1134   HDL 33 (L) 03/05/2019 1526   CHOLHDL 4.2 05/12/2019 1134   VLDL 32 05/12/2019 1134   LDLCALC 68 05/12/2019 1134   LDLCALC 125 (H) 03/05/2019 1526    PHYSICAL EXAM:    VS:  BP 110/70 (BP Location: Left Arm, Patient Position: Sitting, Cuff Size: Normal)   Pulse 62   Ht _0  (1.676 m)   Wt 180 lb (81.6 kg)   SpO2 99%   BMI 29.05 kg/m   BMI: Body mass index is 29.05 kg/m.  Physical Exam Constitutional:      Appearance: He is well-developed.  HENT:     Head: Normocephalic and atraumatic.  Eyes:     General:        Right eye: No discharge.        Left eye: No discharge.  Neck:     Vascular: No JVD.  Cardiovascular:     Rate and Rhythm: Normal rate and regular rhythm.  Pulses: No midsystolic click and no opening snap.          Posterior tibial pulses are 2+ on the right side and 2+ on the left side.     Heart sounds: S1 normal and S2 normal. Heart sounds not distant. Murmur heard.  No friction rub.     Comments: II/VI blowing murmur heard throughout  Pulmonary:     Effort: Pulmonary effort is normal. No respiratory distress.     Breath sounds: Normal breath sounds. No decreased breath sounds, wheezing or rales.  Chest:     Chest wall: No tenderness.  Abdominal:      General: There is no distension.     Palpations: Abdomen is soft.     Tenderness: There is no abdominal tenderness.  Musculoskeletal:     Cervical back: Normal range of motion.  Skin:    General: Skin is warm and dry.     Nails: There is no clubbing.  Neurological:     Mental Status: He is alert and oriented to person, place, and time.  Psychiatric:        Speech: Speech normal.        Behavior: Behavior normal.        Thought Content: Thought content normal.        Judgment: Judgment normal.     Wt Readings from Last 3 Encounters:  09/24/19 180 lb (81.6 kg)  03/05/19 185 lb (83.9 kg)  10/23/18 187 lb 8 oz (85 kg)     ASSESSMENT & PLAN:   1. CAD involving the native coronary arteries with other forms of angina: Currently chest pain-free.  He does note since he was last seen he is now again having intermittent episodes of chest discomfort with associated shortness of breath during hemodialysis sessions.  These seem to occur when he becomes hypotensive with BP into the 04V systolic.  He has intermittently required supplemental oxygen via nasal cannula.  Initially, we will schedule an echo to evaluate for a new cardiomyopathy or valvulopathy.  Further recommendations pending echo results.  With significant hypotension noted during hemodialysis sessions we will taper his Toprol-XL to 12.5 mg daily in an effort to allow for more permissive blood pressure.  He will continue to hold metoprolol and Imdur on dialysis days.  Given history of GI bleed he remains on long-term Plavix monotherapy.  He will otherwise continue secondary prevention with atorvastatin, ezetimibe, isosorbide mononitrate, and metoprolol succinate.    2. HFpEF: Volume managed by HD.  3. PAF: No symptoms concerning for arrhythmia recurrence at this time.  He has not been maintained on [place] secondary to history of GI bleed.  He remains on metoprolol.  4. PAD: Functional status is somewhat limited though no symptoms concerning  for worsening claudication.  Continue statin and Plavix.  5. Carotid artery disease: Stable, 40 to 59% bilateral ICA stenosis by ultrasound in 02/2019.  He is due for follow-up imaging in 02/2020.  He remains on statin therapy and Plavix.  6. ESRD: HD TTS.  He continues to take midodrine on hemodialysis days.  Continue HD per nephrology.  Potassium monitored with dialysis.  Since he was last seen he is having BP dropped into the 40J systolic with hemodialysis.  In this setting we will taper his metoprolol to 12.5 mg daily on nonhemodialysis days in an effort to allow for more permissive blood pressure heading into hemodialysis sessions.  7. HTN: Blood pressure is well controlled today.  Taper metoprolol as outlined above  in an effort to allow for more permissive BP.  Continue current dose of Imdur.  8. HLD: LDL 68 from 04/2019 with normal LFT at that time.  He remains on atorvastatin and ezetimibe.  Disposition: F/u with Dr. Fletcher Anon or an APP in 3 months.   Medication Adjustments/Labs and Tests Ordered: Current medicines are reviewed at length with the patient today.  Concerns regarding medicines are outlined above. Medication changes, Labs and Tests ordered today are summarized above and listed in the Patient Instructions accessible in Encounters.   Signed, Christell Faith, PA-C 09/24/2019 3:05 PM     [address] [place], [place] [address] [phone]

## 2019-09-24 ENCOUNTER — Ambulatory Visit (INDEPENDENT_AMBULATORY_CARE_PROVIDER_SITE_OTHER): Payer: Medicare Other | Admitting: Physician Assistant

## 2019-09-24 ENCOUNTER — Encounter: Payer: Self-pay | Admitting: Physician Assistant

## 2019-09-24 ENCOUNTER — Other Ambulatory Visit: Payer: Self-pay

## 2019-09-24 VITALS — BP 110/70 | HR 62 | Ht 66.0 in | Wt 180.0 lb

## 2019-09-24 DIAGNOSIS — I5032 Chronic diastolic (congestive) heart failure: Secondary | ICD-10-CM | POA: Diagnosis not present

## 2019-09-24 DIAGNOSIS — I25118 Atherosclerotic heart disease of native coronary artery with other forms of angina pectoris: Secondary | ICD-10-CM

## 2019-09-24 DIAGNOSIS — I739 Peripheral vascular disease, unspecified: Secondary | ICD-10-CM

## 2019-09-24 DIAGNOSIS — E785 Hyperlipidemia, unspecified: Secondary | ICD-10-CM

## 2019-09-24 DIAGNOSIS — I480 Paroxysmal atrial fibrillation: Secondary | ICD-10-CM | POA: Diagnosis not present

## 2019-09-24 DIAGNOSIS — N186 End stage renal disease: Secondary | ICD-10-CM

## 2019-09-24 MED ORDER — METOPROLOL SUCCINATE ER 25 MG PO TB24: 12.5000 mg | ORAL_TABLET | Freq: Every day | ORAL | 3 refills | Status: DC

## 2019-09-24 NOTE — Patient Instructions (Signed)
Medication Instructions:  Your physician has recommended you make the following change in your medication:  1. DECREASE Metoprolol succinate 25 mg to 1/2 tablet (12.5 mg) on non-dialysis days.   *If you need a refill on your cardiac medications before your next appointment, please call your pharmacy*   Lab Work: None If you have labs (blood work) drawn today and your tests are completely normal, you will receive your results only by: Marland Kitchen MyChart Message (if you have MyChart) OR . A paper copy in the mail If you have any lab test that is abnormal or we need to change your treatment, we will call you to review the results.   Testing/Procedures: Your physician has requested that you have an echocardiogram. Echocardiography is a painless test that uses sound waves to create images of your heart. It provides your doctor with information about the size and shape of your heart and how well your heart's chambers and valves are working. This procedure takes approximately one hour. There are no restrictions for this procedure.   Follow-Up: At [facility], you and your health needs are our priority.  As part of our continuing mission to provide you with exceptional heart care, we have created designated Provider Care Teams.  These Care Teams include your primary Cardiologist (physician) and Advanced Practice Providers (APPs -  Physician Assistants and Nurse Practitioners) who all work together to provide you with the care you need, when you need it.  Your next appointment:   3 month(s)  The format for your next appointment:   In Person  Provider:    You may see [name], MD or one of the following Advanced Practice Providers on your designated Care Team:    [name], NP  Christell Faith, PA-C  Marrianne Mood, PA-C

## 2019-10-22 ENCOUNTER — Other Ambulatory Visit: Payer: Self-pay | Admitting: Physician Assistant

## 2019-10-22 DIAGNOSIS — I5032 Chronic diastolic (congestive) heart failure: Secondary | ICD-10-CM

## 2019-10-22 DIAGNOSIS — R0600 Dyspnea, unspecified: Secondary | ICD-10-CM

## 2019-10-29 ENCOUNTER — Other Ambulatory Visit: Payer: Self-pay

## 2019-10-29 ENCOUNTER — Ambulatory Visit (INDEPENDENT_AMBULATORY_CARE_PROVIDER_SITE_OTHER): Payer: Medicare Other

## 2019-10-29 DIAGNOSIS — I5032 Chronic diastolic (congestive) heart failure: Secondary | ICD-10-CM

## 2019-10-29 DIAGNOSIS — R0600 Dyspnea, unspecified: Secondary | ICD-10-CM | POA: Diagnosis not present

## 2019-10-29 LAB — ECHOCARDIOGRAM COMPLETE
AR max vel: 2.98 cm2
AV Area VTI: 3 cm2
AV Area mean vel: 2.73 cm2
AV Mean grad: 4 mmHg
AV Peak grad: 7.8 mmHg
Ao pk vel: 1.4 m/s
Area-P 1/2: 3 cm2
Calc EF: 54.5 %
Single Plane A2C EF: 53.2 %
Single Plane A4C EF: 58.8 %

## 2019-10-31 ENCOUNTER — Emergency Department (HOSPITAL_COMMUNITY): Payer: Medicare Other

## 2019-10-31 ENCOUNTER — Encounter (HOSPITAL_COMMUNITY): Payer: Self-pay

## 2019-10-31 ENCOUNTER — Inpatient Hospital Stay (HOSPITAL_COMMUNITY): Payer: Medicare Other

## 2019-10-31 ENCOUNTER — Inpatient Hospital Stay (HOSPITAL_COMMUNITY)
Admission: EM | Admit: 2019-10-31 | Discharge: 2019-11-05 | DRG: 417 | Disposition: A | Discharge status: Home / Self Care | Payer: Medicare Other | Attending: Internal Medicine | Admitting: Internal Medicine

## 2019-10-31 ENCOUNTER — Other Ambulatory Visit: Payer: Self-pay

## 2019-10-31 DIAGNOSIS — K851 Biliary acute pancreatitis without necrosis or infection: Principal | ICD-10-CM | POA: Diagnosis present

## 2019-10-31 DIAGNOSIS — Z8546 Personal history of malignant neoplasm of prostate: Secondary | ICD-10-CM | POA: Diagnosis not present

## 2019-10-31 DIAGNOSIS — I447 Left bundle-branch block, unspecified: Secondary | ICD-10-CM | POA: Diagnosis present

## 2019-10-31 DIAGNOSIS — Z992 Dependence on renal dialysis: Secondary | ICD-10-CM | POA: Diagnosis not present

## 2019-10-31 DIAGNOSIS — I132 Hypertensive heart and chronic kidney disease with heart failure and with stage 5 chronic kidney disease, or end stage renal disease: Secondary | ICD-10-CM | POA: Diagnosis present

## 2019-10-31 DIAGNOSIS — Z87442 Personal history of urinary calculi: Secondary | ICD-10-CM

## 2019-10-31 DIAGNOSIS — Z833 Family history of diabetes mellitus: Secondary | ICD-10-CM | POA: Diagnosis not present

## 2019-10-31 DIAGNOSIS — Z87891 Personal history of nicotine dependence: Secondary | ICD-10-CM

## 2019-10-31 DIAGNOSIS — K859 Acute pancreatitis without necrosis or infection, unspecified: Principal | ICD-10-CM | POA: Diagnosis present

## 2019-10-31 DIAGNOSIS — E8889 Other specified metabolic disorders: Secondary | ICD-10-CM | POA: Diagnosis present

## 2019-10-31 DIAGNOSIS — N2581 Secondary hyperparathyroidism of renal origin: Secondary | ICD-10-CM | POA: Diagnosis present

## 2019-10-31 DIAGNOSIS — Z20822 Contact with and (suspected) exposure to covid-19: Secondary | ICD-10-CM | POA: Diagnosis present

## 2019-10-31 DIAGNOSIS — I10 Essential (primary) hypertension: Secondary | ICD-10-CM | POA: Diagnosis not present

## 2019-10-31 DIAGNOSIS — M109 Gout, unspecified: Secondary | ICD-10-CM | POA: Diagnosis present

## 2019-10-31 DIAGNOSIS — M1990 Unspecified osteoarthritis, unspecified site: Secondary | ICD-10-CM | POA: Diagnosis present

## 2019-10-31 DIAGNOSIS — Z8249 Family history of ischemic heart disease and other diseases of the circulatory system: Secondary | ICD-10-CM | POA: Diagnosis not present

## 2019-10-31 DIAGNOSIS — N186 End stage renal disease: Secondary | ICD-10-CM | POA: Diagnosis present

## 2019-10-31 DIAGNOSIS — I739 Peripheral vascular disease, unspecified: Secondary | ICD-10-CM | POA: Diagnosis present

## 2019-10-31 DIAGNOSIS — K8067 Calculus of gallbladder and bile duct with acute and chronic cholecystitis with obstruction: Secondary | ICD-10-CM | POA: Diagnosis present

## 2019-10-31 DIAGNOSIS — G629 Polyneuropathy, unspecified: Secondary | ICD-10-CM | POA: Diagnosis present

## 2019-10-31 DIAGNOSIS — I25118 Atherosclerotic heart disease of native coronary artery with other forms of angina pectoris: Secondary | ICD-10-CM | POA: Diagnosis not present

## 2019-10-31 DIAGNOSIS — I953 Hypotension of hemodialysis: Secondary | ICD-10-CM | POA: Diagnosis not present

## 2019-10-31 DIAGNOSIS — D631 Anemia in chronic kidney disease: Secondary | ICD-10-CM | POA: Diagnosis present

## 2019-10-31 DIAGNOSIS — Z79899 Other long term (current) drug therapy: Secondary | ICD-10-CM

## 2019-10-31 DIAGNOSIS — Z7902 Long term (current) use of antithrombotics/antiplatelets: Secondary | ICD-10-CM

## 2019-10-31 DIAGNOSIS — I252 Old myocardial infarction: Secondary | ICD-10-CM | POA: Diagnosis not present

## 2019-10-31 DIAGNOSIS — K8063 Calculus of gallbladder and bile duct with acute cholecystitis with obstruction: Secondary | ICD-10-CM

## 2019-10-31 DIAGNOSIS — Z955 Presence of coronary angioplasty implant and graft: Secondary | ICD-10-CM | POA: Diagnosis not present

## 2019-10-31 DIAGNOSIS — I480 Paroxysmal atrial fibrillation: Secondary | ICD-10-CM | POA: Diagnosis present

## 2019-10-31 DIAGNOSIS — K219 Gastro-esophageal reflux disease without esophagitis: Secondary | ICD-10-CM | POA: Diagnosis present

## 2019-10-31 DIAGNOSIS — I2510 Atherosclerotic heart disease of native coronary artery without angina pectoris: Secondary | ICD-10-CM | POA: Diagnosis present

## 2019-10-31 DIAGNOSIS — I5032 Chronic diastolic (congestive) heart failure: Secondary | ICD-10-CM | POA: Diagnosis present

## 2019-10-31 DIAGNOSIS — E7800 Pure hypercholesterolemia, unspecified: Secondary | ICD-10-CM | POA: Diagnosis present

## 2019-10-31 LAB — COMPREHENSIVE METABOLIC PANEL
ALT: 151 U/L — ABNORMAL HIGH (ref 0–44)
AST: 257 U/L — ABNORMAL HIGH (ref 15–41)
Albumin: 4.5 g/dL (ref 3.5–5.0)
Alkaline Phosphatase: 112 U/L (ref 38–126)
Anion gap: 17 — ABNORMAL HIGH (ref 5–15)
BUN: 27 mg/dL — ABNORMAL HIGH (ref 8–23)
CO2: 26 mmol/L (ref 22–32)
Calcium: 9.7 mg/dL (ref 8.9–10.3)
Chloride: 98 mmol/L (ref 98–111)
Creatinine, Ser: 6.53 mg/dL — ABNORMAL HIGH (ref 0.61–1.24)
GFR calc Af Amer: 9 mL/min — ABNORMAL LOW (ref >60)
GFR calc non Af Amer: 8 mL/min — ABNORMAL LOW (ref >60)
Glucose, Bld: 132 mg/dL — ABNORMAL HIGH (ref 70–99)
Potassium: 4.6 mmol/L (ref 3.5–5.1)
Sodium: 141 mmol/L (ref 135–145)
Total Bilirubin: 1.8 mg/dL — ABNORMAL HIGH (ref 0.3–1.2)
Total Protein: 8.8 g/dL — ABNORMAL HIGH (ref 6.5–8.1)

## 2019-10-31 LAB — TROPONIN I (HIGH SENSITIVITY)
Troponin I (High Sensitivity): 12 ng/L (ref <18)
Troponin I (High Sensitivity): 20 ng/L — ABNORMAL HIGH (ref <18)

## 2019-10-31 LAB — CBC
HCT: 37.6 % — ABNORMAL LOW (ref 39.0–52.0)
Hemoglobin: 11.7 g/dL — ABNORMAL LOW (ref 13.0–17.0)
MCH: 32 pg (ref 26.0–34.0)
MCHC: 31.1 g/dL (ref 30.0–36.0)
MCV: 102.7 fL — ABNORMAL HIGH (ref 80.0–100.0)
Platelets: 142 10*3/uL — ABNORMAL LOW (ref 150–400)
RBC: 3.66 MIL/uL — ABNORMAL LOW (ref 4.22–5.81)
RDW: 16.9 % — ABNORMAL HIGH (ref 11.5–15.5)
WBC: 8.9 10*3/uL (ref 4.0–10.5)
nRBC: 0 % (ref 0.0–0.2)

## 2019-10-31 LAB — SARS CORONAVIRUS 2 BY RT PCR (HOSPITAL ORDER, PERFORMED IN ~~LOC~~ HOSPITAL LAB): SARS Coronavirus 2: NEGATIVE

## 2019-10-31 LAB — LIPASE, BLOOD: Lipase: >10000 U/L — ABNORMAL HIGH (ref 11–51)

## 2019-10-31 LAB — LACTATE DEHYDROGENASE: LDH: 262 U/L — ABNORMAL HIGH (ref 98–192)

## 2019-10-31 MED ORDER — DEXTROSE IN LACTATED RINGERS 5 % IV SOLN: INTRAVENOUS | Status: DC

## 2019-10-31 MED ORDER — HYDROMORPHONE HCL 1 MG/ML IJ SOLN
1.0000 mg | Freq: Once | INTRAMUSCULAR | Status: AC
  Administered 2019-10-31: 1 mg via INTRAVENOUS
  Filled 2019-10-31: qty 1

## 2019-10-31 MED ORDER — HYDRALAZINE HCL 20 MG/ML IJ SOLN
5.0000 mg | Freq: Four times a day (QID) | INTRAMUSCULAR | Status: DC | PRN
  Administered 2019-10-31: 5 mg via INTRAVENOUS
  Filled 2019-10-31: qty 1

## 2019-10-31 MED ORDER — ONDANSETRON HCL 4 MG/2ML IJ SOLN
4.0000 mg | Freq: Once | INTRAMUSCULAR | Status: AC
  Administered 2019-10-31: 4 mg via INTRAVENOUS
  Filled 2019-10-31: qty 2

## 2019-10-31 MED ORDER — ONDANSETRON HCL 4 MG/2ML IJ SOLN
4.0000 mg | Freq: Four times a day (QID) | INTRAMUSCULAR | Status: DC | PRN
  Administered 2019-11-01: 4 mg via INTRAVENOUS
  Filled 2019-10-31: qty 2

## 2019-10-31 MED ORDER — ONDANSETRON HCL 4 MG PO TABS: 4.0000 mg | ORAL_TABLET | Freq: Four times a day (QID) | ORAL | Status: DC | PRN

## 2019-10-31 MED ORDER — HEPARIN SODIUM (PORCINE) 5000 UNIT/ML IJ SOLN
5000.0000 [IU] | Freq: Three times a day (TID) | INTRAMUSCULAR | Status: DC
  Administered 2019-10-31 – 2019-11-04 (×11): 5000 [IU] via SUBCUTANEOUS
  Filled 2019-10-31 (×12): qty 1

## 2019-10-31 MED ORDER — IOHEXOL 300 MG/ML  SOLN: 100.0000 mL | Freq: Once | INTRAMUSCULAR | Status: DC | PRN

## 2019-10-31 MED ORDER — ONDANSETRON 4 MG PO TBDP
4.0000 mg | ORAL_TABLET | Freq: Once | ORAL | Status: AC | PRN
  Administered 2019-10-31: 4 mg via ORAL

## 2019-10-31 MED ORDER — HYDROMORPHONE HCL 1 MG/ML IJ SOLN
1.0000 mg | INTRAMUSCULAR | Status: DC | PRN
  Administered 2019-11-01 – 2019-11-02 (×3): 1 mg via INTRAVENOUS
  Filled 2019-10-31 (×3): qty 1

## 2019-10-31 NOTE — Progress Notes (Signed)
Patient came to CT Scan for CT Abd/Pelvis. Patient had 75-cc Omnipaque 300 at right mid forearm IV site. Dr. Marylyn Ishihara Talbott(radiologist) came to examine arm and requested post extravasation orders be placed. No other IV access at time. Patient returned to ED. Nurse Annie Main informed of extravasation and need for new IV for exam to be completed.

## 2019-10-31 NOTE — ED Provider Notes (Signed)
[facility] EMERGENCY DEPARTMENT Provider Note   CSN: [id number] Arrival date & time: 10/31/19  1120     History No chief complaint on file.   Roger [name] is a 79 y.o. male.  The history is provided by the patient.  Emesis Severity:  Moderate Duration:  12 hours Timing:  Constant Number of daily episodes:  Numerous Progression:  Unchanged Chronicity:  New Context: not post-tussive and not self-induced   Relieved by:  Nothing Worsened by:  Nothing Ineffective treatments:  None tried Associated symptoms: abdominal pain   Associated symptoms: no arthralgias, no chills, no cough, no fever and no sore throat   Associated symptoms comment:  Chest pain Risk factors: diabetes        Past Medical History:  Diagnosis Date   Anemia    Anginal pain ([place])    Arthritis    "all over; bad in the legs" (12/04/2016)   CAD (coronary artery disease)    a. NSTEMI in setting of anemia; b. 10/2016 MV: reversible ant, apical, inflat defect; c. 10/2016 Cath: LM nl, LAD 40p, 68m, D1 70, D2 80, RI min irregs, LCX 100ost, RCA 100p, 122m/d, RPDA fills via collats from LAD, RPAV small-->Initial conservative Rx in setting of GIB w/ plan for PCI LAD if H/H stable on ASA/Plavix;  d. 12/04/16 s/p PTCA and DES to mLAD; e. 09/2018 MV: EF 54%, no ischemia.   Carotid disease, bilateral ([place])    a. 04/2017 U/S: RICA 22-29, RCCA >79, LICA 89-21.   Depression    situational    Diastolic dysfunction    a. 10/2016 Echo: EF 55-60%, no rwma, Gr2 DD, triv MR, mildly dil LA; b. 07/2017 Echo: EF 60-65%, Gr1 DD. No rwma. AoV leaflet thickening (not felt to be SBE upon further review). Mild MR. Mildly dil LA.   ESRD on dialysis [facility])    "[place]; [address]; TTS usually; going to [facility] in [place], [place] right now while staying w/daughter" (12/04/2016)   GERD (gastroesophageal reflux disease)    GIB (gastrointestinal bleeding) 10/2011   a. 10/2016 3u PRBCs - EGD/Colonoscopy: angiodysplasia w/  diverticulosis. No apparent bleeding. Polypecotmy->tubular adenomas.   Gout    Headache    no migraines for years   Heart murmur    High cholesterol    History of blood transfusion 10/18/2016   "related to anemia" (12/04/2016)   History of kidney stones    years ago   Hypertension    Iron deficiency anemia    Myocardial infarction ([place]) 10/18/2016   Old MI (myocardial infarction)    "found evidence of this on 10/18/2016"   PAF (paroxysmal atrial fibrillation) ([place])    Peripheral vascular disease (H[facility])    BLE   Prostate cancer ([place])    a. s/p seed implantation.   Tobacco abuse     Patient Active Problem List   Diagnosis Date Noted   Acute pancreatitis 10/31/2019   Chest pain 07/25/2017   CAD (coronary artery disease) 07/25/2017   ESRD (end stage renal disease) ([place]) 07/25/2017   Aortic valve sclerosis    Essential hypertension    Pure hypercholesterolemia    Right hand pain 05/28/2017   Angiodysplasia of cecum    Benign neoplasm of cecum    Benign neoplasm of ascending colon    Benign neoplasm of descending colon    CKD (chronic kidney disease) stage 5, GFR less than 15 ml/min ([place])    Melena    Demand ischemia ([place])  Lower GI bleed    Tobacco abuse    Angina pectoris ([place]) 10/18/2016   ARF (acute renal failure) ([place]) 10/18/2016   Hyperkalemia 10/18/2016   Blood loss anemia 10/18/2016   Elevated troponin 10/18/2016   Atherosclerotic PVD with intermittent claudication ([place]) 12/29/2013   Peripheral vascular disease, unspecified ([place]) 12/09/2012    Past Surgical History:  Procedure Laterality Date   AV FISTULA PLACEMENT Right 03/28/2017   Procedure: ARTERIOVENOUS (AV) BRACHIOCEPHALIC FISTULA CREATION;  Surgeon: Angelia Mould, MD;  Location: [facility];  Service: Vascular;  Laterality: Right;   CARDIAC CATHETERIZATION  10/2016   COLONOSCOPY WITH PROPOFOL N/A 10/21/2016   Procedure: COLONOSCOPY WITH PROPOFOL;  Surgeon:  Gatha Mayer, MD;  Location: [facility] ENDOSCOPY;  Service: Endoscopy;  Laterality: N/A;   CORONARY ANGIOPLASTY WITH STENT PLACEMENT  12/04/2016   "1 stent"   CORONARY STENT INTERVENTION N/A 12/04/2016   Procedure: CORONARY STENT INTERVENTION;  Surgeon: Wellington Hampshire, MD;  Location: [facility] CV LAB;  Service: Cardiovascular;  Laterality: N/A;   ESOPHAGOGASTRODUODENOSCOPY N/A 10/21/2016   Procedure: ESOPHAGOGASTRODUODENOSCOPY (EGD);  Surgeon: Gatha Mayer, MD;  Location: [facility] ENDOSCOPY;  Service: Endoscopy;  Laterality: N/A;   INSERTION OF DIALYSIS CATHETER Right 10/22/2016   Procedure: INSERTION OF TUNNELED DIALYSIS CATHETER;  Surgeon: Elam Dutch, MD;  Location: [facility] OR;  Service: Vascular;  Laterality: Right;   LEFT HEART CATH AND CORONARY ANGIOGRAPHY N/A 10/23/2016   Procedure: LEFT HEART CATH AND CORONARY ANGIOGRAPHY;  Surgeon: Wellington Hampshire, MD;  Location: [facility] CV LAB;  Service: Cardiovascular;  Laterality: N/A;   LIGATION OF ARTERIOVENOUS  FISTULA Right 05/12/2017   Procedure: BANDING OF RIGHT UPPER ARM ARTERIOVENOUS  FISTULA USING 6MM X 10CM GORE TEX VASCULAR GRAFT;  Surgeon: Angelia Mould, MD;  Location: [facility] OR;  Service: Vascular;  Laterality: Right;   LIGATION OF ARTERIOVENOUS  FISTULA Right 07/03/2017   Procedure: LIGATION OF ARTERIOVENOUS  FISTULA RIGHT ARM;  Surgeon: Angelia Mould, MD;  Location: [facility] OR;  Service: Vascular;  Laterality: Right;   TONSILLECTOMY         Family History  Problem Relation Age of Onset   Cancer Mother    Cancer Father    Heart attack Father    Diabetes Father    Hypertension Father    Cancer Sister    Cancer Brother    Diabetes Brother    Hypertension Brother    Hypertension Daughter     Social History   Tobacco Use   Smoking status: Former Smoker    Packs/day: 0.50    Years: 50.00    Pack years: 25.00    Types: Cigarettes    Quit date: 10/18/2016    Years since quitting: 3.0   Smokeless  tobacco: Never Used  Vaping Use   Vaping Use: Former  Substance Use Topics   Alcohol use: No   Drug use: No    Home Medications Prior to Admission medications   Medication Sig Start Date End Date Taking? Authorizing Provider  allopurinol (ZYLOPRIM) 100 MG tablet Take 1 tablet (100 mg total) by mouth daily. 10/26/16  Yes Florencia Reasons, MD  atorvastatin (LIPITOR) 80 MG tablet TAKE 1 TABLET(80 MG) BY MOUTH DAILY AT 6 PM Patient taking differently: Take 80 mg by mouth every evening.  11/30/18  Yes Dunn, Areta Haber, PA-C  b complex vitamins tablet Take 1 tablet by mouth at bedtime.    Yes [provider]  calcitRIOL (ROCALTROL) 0.25 MCG capsule Take 1  capsule (0.25 mcg total) by mouth Every Tuesday,Thursday,and Saturday with dialysis. 12/05/16  Yes Bhagat, Bhavinkumar, PA  clopidogrel (PLAVIX) 75 MG tablet Take 1 tablet (75 mg total) by mouth daily. 08/03/18  Yes Wellington Hampshire, MD  ezetimibe (ZETIA) 10 MG tablet Take 1 tablet (10 mg total) by mouth daily. 03/09/19 10/31/19 Yes Theora Gianotti, NP  ferric citrate (AURYXIA) 1 GM 210 MG(Fe) tablet Take 420 mg by mouth 2 (two) times daily.    Yes [provider]  isosorbide mononitrate (IMDUR) 30 MG 24 hr tablet Take 1 tablet (30 mg) by mouth once daily in the evening 07/29/18  Yes Wellington Hampshire, MD  metoprolol succinate (TOPROL-XL) 25 MG 24 hr tablet Take 0.5 tablets (12.5 mg total) by mouth daily. Take 1 tablet (25 mg) by mouth once daily on non-dialysis days 09/24/19  Yes Dunn, Areta Haber, PA-C  midodrine (PROAMATINE) 10 MG tablet Take 1 tablet (10 mg total) by mouth 2 (two) times daily as needed (Twice daily on hemodialysis days). TWICE A DAY ONLY ON HEMODIALYSIS DAYS Patient taking differently: Take 10 mg by mouth every evening. TWICE A DAY ONLY ON HEMODIALYSIS DAYS 10/23/18  Yes Wellington Hampshire, MD  nitroGLYCERIN (NITROSTAT) 0.4 MG SL tablet Place 1 tablet (0.4 mg total) under the tongue every 5 (five) minutes as needed for  chest pain. 05/14/18  Yes Nat Christen, MD  pantoprazole (PROTONIX) 40 MG tablet Take 40 mg by mouth 2 (two) times daily.  03/30/18  Yes [provider]  sucralfate (CARAFATE) 1 GM/10ML suspension Take 1 g by mouth 2 (two) times daily.    Yes [provider]  tamsulosin (FLOMAX) 0.4 MG CAPS capsule Take 0.4 mg by mouth daily. 04/26/18  Yes [provider]    Allergies    Patient has no known allergies.  Review of Systems   Review of Systems  Constitutional: Negative for chills and fever.  HENT: Negative for ear pain and sore throat.   Eyes: Negative for pain and visual disturbance.  Respiratory: Negative for cough and shortness of breath.   Cardiovascular: Negative for chest pain and palpitations.  Gastrointestinal: Positive for abdominal pain, constipation and vomiting.  Genitourinary: Negative for dysuria and hematuria.  Musculoskeletal: Negative for arthralgias and back pain.  Skin: Negative for color change and rash.  Neurological: Negative for seizures and syncope.  All other systems reviewed and are negative.   Physical Exam Updated Vital Signs BP (!) 176/58    Pulse (!) 57    Temp 97.9 F (36.6 C) (Oral)    Resp 14    SpO2 99%   Physical Exam Vitals and nursing note reviewed.  Constitutional:      Appearance: He is well-developed.     Comments: Appears uncomfortable.  Had 1 episode of nbnb emesis during exam.  HENT:     Head: Normocephalic and atraumatic.  Eyes:     Conjunctiva/sclera: Conjunctivae normal.  Cardiovascular:     Rate and Rhythm: Normal rate and regular rhythm.     Heart sounds: No murmur heard.   Pulmonary:     Effort: Pulmonary effort is normal. No respiratory distress.     Breath sounds: Normal breath sounds.  Abdominal:     Palpations: Abdomen is soft.     Tenderness: There is no abdominal tenderness. There is no guarding or rebound.     Comments: Epigastric and RUQ tenderness w/o rebound or guarding.    Musculoskeletal:       Cervical  back: Neck supple.  Skin:    General: Skin is warm and dry.  Neurological:     General: No focal deficit present.     Mental Status: He is alert and oriented to person, place, and time. Mental status is at baseline.  Psychiatric:        Behavior: Behavior normal.     ED Results / Procedures / Treatments   Labs (all labs ordered are listed, but only abnormal results are displayed) Labs Reviewed  LIPASE, BLOOD - Abnormal; Notable for the following components:      Result Value   Lipase >10,000 (*)    All other components within normal limits  COMPREHENSIVE METABOLIC PANEL - Abnormal; Notable for the following components:   Glucose, Bld 132 (*)    BUN 27 (*)    Creatinine, Ser 6.53 (*)    Total Protein 8.8 (*)    AST 257 (*)    ALT 151 (*)    Total Bilirubin 1.8 (*)    GFR calc non Af Amer 8 (*)    GFR calc Af Amer 9 (*)    Anion gap 17 (*)    All other components within normal limits  CBC - Abnormal; Notable for the following components:   RBC 3.66 (*)    Hemoglobin 11.7 (*)    HCT 37.6 (*)    MCV 102.7 (*)    RDW 16.9 (*)    Platelets 142 (*)    All other components within normal limits  LACTATE DEHYDROGENASE - Abnormal; Notable for the following components:   LDH 262 (*)    All other components within normal limits  TROPONIN I (HIGH SENSITIVITY) - Abnormal; Notable for the following components:   Troponin I (High Sensitivity) 20 (*)    All other components within normal limits  SARS CORONAVIRUS 2 BY RT PCR (HOSPITAL ORDER, [facility])  URINALYSIS, ROUTINE W REFLEX MICROSCOPIC  CBC  CREATININE, SERUM  COMPREHENSIVE METABOLIC PANEL  CBC  LIPASE, BLOOD  TROPONIN I (HIGH SENSITIVITY)    EKG EKG Interpretation  Date/Time:  Sunday October 31 2019 15:40:36 EDT Ventricular Rate:  51 PR Interval:    QRS Duration: 127 QT Interval:  508 QTC Calculation: 468 R Axis:   62 Text Interpretation: Sinus or ectopic atrial rhythm  Left bundle branch block No significant change since last tracing Confirmed by Blanchie Dessert [phone]) on 10/31/2019 3:43:25 PM   Radiology CT ABDOMEN PELVIS WO CONTRAST  Result Date: 10/31/2019 CLINICAL DATA:  Nausea for 2 days and vomiting. Concern for pancreatitis. EXAM: CT ABDOMEN AND PELVIS WITHOUT CONTRAST TECHNIQUE: Multidetector CT imaging of the abdomen and pelvis was performed following the standard protocol without IV contrast. COMPARISON:  MR abdomen dated 06/02/2019 and CT abdomen pelvis dated 10/18/2016 FINDINGS: Lower chest: There is mild bilateral lower lobe atelectasis. Hepatobiliary: No focal liver abnormality is seen. There is a small amount of fat stranding surrounding the gallbladder. There is no definite gallbladder wall thickening. No biliary dilatation. Pancreas: There is a minimal amount of fat stranding near the inferior aspect of the pancreatic body (series 7, image 57). No pancreatic ductal dilatation. Spleen: Normal in size without focal abnormality. Adrenals/Urinary Tract: Adrenal glands are unremarkable. Kidneys are atrophic, without renal calculi, focal lesion, or hydronephrosis. Bladder is unremarkable. Stomach/Bowel: Stomach is within normal limits. Appendix appears normal. There is colonic diverticulosis without evidence of diverticulitis. no evidence of bowel wall thickening, distention, or inflammatory changes. Vascular/Lymphatic: Aortic atherosclerosis. No enlarged  abdominal or pelvic lymph nodes. Reproductive: The prostate is enlarged, measuring 5.5 cm in transverse dimension. Other: No abdominal wall hernia or abnormality. No abdominopelvic ascites. Musculoskeletal: No acute or significant osseous findings. IMPRESSION: 1. There is a small amount of fat stranding surrounding the gallbladder without definite gallbladder wall thickening. Findings are nonspecific but could represent acute cholecystitis. 2. Minimal amount of fat stranding near the pancreatic body is  nonspecific but could represent early/mild acute pancreatitis. Aortic Atherosclerosis (ICD10-I70.0). Electronically Signed   By: Zerita Boers M.D.   On: 10/31/2019 19:12   DG Chest 1 View  Result Date: 10/31/2019 CLINICAL DATA:  Nausea. EXAM: CHEST  1 VIEW COMPARISON:  Chest radiograph 05/14/2018. FINDINGS: Monitoring leads overlie the patient. Dialysis catheter tip projects over the superior vena cava. Stable cardiac and mediastinal contours. Aortic atherosclerosis. Bibasilar heterogeneous opacities. No pleural effusion or pneumothorax. Thoracic spine degenerative changes. IMPRESSION: Bibasilar atelectasis. Electronically Signed   By: Lovey Newcomer M.D.   On: 10/31/2019 16:38   US Abdomen Limited RUQ  Result Date: 10/31/2019 CLINICAL DATA:  Pancreatitis. EXAM: ULTRASOUND ABDOMEN LIMITED RIGHT UPPER QUADRANT COMPARISON:  None. FINDINGS: Gallbladder: Echogenic shadowing gallstones and gallbladder sludge are seen within the gallbladder lumen. Gallbladder wall thickening is noted (7.4 mm). No sonographic Murphy sign noted by sonographer. Common bile duct: Diameter: 6.2 mm Liver: No focal lesion identified. Within normal limits in parenchymal echogenicity. Portal vein is patent on color Doppler imaging with normal direction of blood flow towards the liver. Other: None. IMPRESSION: Cholelithiasis, gallbladder sludge and gallbladder wall thickening in the absence of a positive sonographic Murphy sign. Further correlation with a nuclear medicine hepatobiliary scan is recommended, as acute cholecystitis cannot completely be excluded. Electronically Signed   By: Virgina Norfolk M.D.   On: 10/31/2019 21:28    Procedures Procedures (including critical care time)  Medications Ordered in ED Medications  iohexol (OMNIPAQUE) 300 MG/ML solution 100 mL (has no administration in time range)  heparin injection 5,000 Units (5,000 Units Subcutaneous Given 10/31/19 2152)  dextrose 5 % in lactated ringers infusion (  Intravenous New Bag/Given 10/31/19 2151)  HYDROmorphone (DILAUDID) injection 1 mg (has no administration in time range)  ondansetron (ZOFRAN) tablet 4 mg (has no administration in time range)    Or  ondansetron (ZOFRAN) injection 4 mg (has no administration in time range)  hydrALAZINE (APRESOLINE) injection 5 mg (5 mg Intravenous Given 10/31/19 2036)  ondansetron (ZOFRAN-ODT) disintegrating tablet 4 mg (4 mg Oral Given 10/31/19 1246)  ondansetron (ZOFRAN) injection 4 mg (4 mg Intravenous Given 10/31/19 1814)  HYDROmorphone (DILAUDID) injection 1 mg (1 mg Intravenous Given 10/31/19 1814)    ED Course  I have reviewed the triage vital signs and the nursing notes.  Pertinent labs & imaging results that were available during my care of the patient were reviewed by me and considered in my medical decision making (see chart for details).    MDM Rules/Calculators/A&P                          79 year old male with history of ESRD currently on hemodialysis, history of GI bleed who presents with abdominal pain and nausea/vomiting.  Patient states that he has been having numerous episodes of nonbilious nonbloody emesis since 3 AM this morning.  Patient is unable to quantify the amount of vomiting that he has had.  Endorses associated chest pain as well as epigastric pain and difficulty stooling.  Denies fever, chills, shortness of breath,  cough.  States that he has a Tuesday Thursday Saturday dialysis patient and states that he went to dialysis yesterday  Afebrile vital signs stable.  Exam as above.  Exam is notable for epigastric as well as left upper quadrant tenderness to palpation without rebound or guarding.  Lungs clear to auscultation bilaterally.  Heart sounds within normal limits.  CMP notable for elevated AST (257) C and ALT (151).  White count 8.9.  Lipase greater than 10,000.  Troponin pending.  Will obtain CT of his abdomen and pelvis as well as obtain chest x-ray and reassess the patient.  CT  abdomen pelvis notable for early to mild pancreatitis.  Also inflammatory changes noted on gallbladder that may represent cholecystitis however there is no evidence of gallbladder wall thickening on CT.  Unclear whether or not this represents an acute cholecystitis.  Given the patient's overall symptoms, multiple comorbidities pancreatitis feel the patient would benefit from admission to the hospitalist service.  Discussed the case with the hospitalist who evaluated the patient at bedside and agreed with admission to their service.  Patient was admitted to the hospitalist service in stable condition without further events.  Patient had no further events during the duration of my shift.  Final Clinical Impression(s) / ED Diagnoses Final diagnoses:  Pancreatitis    Rx / DC Orders ED Discharge Orders    None       Silvestre Gunner, MD 10/31/19 2111    Gareth Morgan, MD 11/02/19 [phone]

## 2019-10-31 NOTE — ED Triage Notes (Signed)
Patient complains of nausea x 2 days with emesis that started last night, started during dialysis yesterday, finished full treatment. Patient alert and oriented. No diarrhea

## 2019-10-31 NOTE — ED Notes (Signed)
Patient transported to Ultrasound 

## 2019-10-31 NOTE — H&P (Signed)
History and Physical   [name] VEH:[id number] DOB: March 11, 1941 DOA: 10/31/2019  Referring MD/NP/PA: Dr. Billy Fischer   PCP: Lujean Amel, MD   Outpatient Specialists: [facility]  Patient coming from: Home  Chief Complaint: Abdominal pain nausea vomiting  HPI: Roger [name] is a 79 y.o. male with medical history significant of end-stage renal disease on hemodialysis Tuesdays Thursdays and Saturdays, hypertension, coronary artery disease, gout, diastolic dysfunction, prostate cancer, prior tobacco abuse and peripheral neuropathy who presented to the ER today with significant abdominal pain nausea vomiting.  Pain is rated as 10 out of 10 in the mid abdomen.  Radiating to his back.  Associated with the nausea vomiting and some episode of diarrhea.  Denied any fever or chills no hematemesis no melena no bright red blood per rectum.  Patient was back to his hemodialysis yesterday he is not having significant shortness of breath or cough.  No known Covid exposure.  Patient was evaluated in the ER and CT abdomen pelvis showed evidence of acute pancreatitis lipase is more than 10,000 has been admitted with acute idiopathic pancreatitis.  He denied alcohol abuse..  ED Course: Temperature is 97.9 blood pressure 190/60 pulse 60 respiratory 90 oxygen 98% room air.  White count 8.8 hemoglobin 11.7 platelets 142.  Sodium 141 potassium 4.6 chloride 90 CO2 26 BUN 27 creatinine 6.53 glucose 132 and calcium 9.7.  Lipase is more than 10,000 AST 257 ALT 151 total bilirubin 1.8.  LDH 262 troponin XX.  Chest x-ray shows bibasilar atelectasis.  CT abdomen pelvis showed small amount of fat stranding surrounding gallbladder no gallbladder wall thickening findings nonspecific but probably acute cholecystitis there is minimal amount of fat stranding near the pancreatic body probably pancreatitis.  Patient is being admitted for further evaluation and treatment.  Review of Systems: As per HPI otherwise 10  point review of systems negative.    Past Medical History:  Diagnosis Date  . Anemia   . Anginal pain ([place])   . Arthritis    "all over; bad in the legs" (12/04/2016)  . CAD (coronary artery disease)    a. NSTEMI in setting of anemia; b. 10/2016 MV: reversible ant, apical, inflat defect; c. 10/2016 Cath: LM nl, LAD 40p, 34m, D1 70, D2 80, RI min irregs, LCX 100ost, RCA 100p, 120m/d, RPDA fills via collats from LAD, RPAV small-->Initial conservative Rx in setting of GIB w/ plan for PCI LAD if H/H stable on ASA/Plavix;  d. 12/04/16 s/p PTCA and DES to mLAD; e. 09/2018 MV: EF 54%, no ischemia.  . Carotid disease, bilateral ([place])    a. 04/2017 U/S: RICA 83-66, RCCA >29, LICA 47-65.  Marland Kitchen Depression    situational   . Diastolic dysfunction    a. 10/2016 Echo: EF 55-60%, no rwma, Gr2 DD, triv MR, mildly dil LA; b. 07/2017 Echo: EF 60-65%, Gr1 DD. No rwma. AoV leaflet thickening (not felt to be SBE upon further review). Mild MR. Mildly dil LA.  Marland Kitchen ESRD on dialysis [facility])    "[place]; [address]; TTS usually; going to [facility] in [place], [place] right now while staying w/daughter" (12/04/2016)  . GERD (gastroesophageal reflux disease)   . GIB (gastrointestinal bleeding) 10/2011   a. 10/2016 3u PRBCs - EGD/Colonoscopy: angiodysplasia w/ diverticulosis. No apparent bleeding. Polypecotmy->tubular adenomas.  . Gout   . Headache    no migraines for years  . Heart murmur   . High cholesterol   . History of blood transfusion 10/18/2016   "related to  anemia" (12/04/2016)  . History of kidney stones    years ago  . Hypertension   . Iron deficiency anemia   . Myocardial infarction ([place]) 10/18/2016  . Old MI (myocardial infarction)    "found evidence of this on 10/18/2016"  . PAF (paroxysmal atrial fibrillation) ([place])   . Peripheral vascular disease ([place])    BLE  . Prostate cancer ([place])    a. s/p seed implantation.  . Tobacco abuse     Past Surgical History:  Procedure Laterality Date  . AV FISTULA PLACEMENT  Right 03/28/2017   Procedure: ARTERIOVENOUS (AV) BRACHIOCEPHALIC FISTULA CREATION;  Surgeon: Angelia Mould, MD;  Location: [facility];  Service: Vascular;  Laterality: Right;  . CARDIAC CATHETERIZATION  10/2016  . COLONOSCOPY WITH PROPOFOL N/A 10/21/2016   Procedure: COLONOSCOPY WITH PROPOFOL;  Surgeon: Gatha Mayer, MD;  Location: [facility] ENDOSCOPY;  Service: Endoscopy;  Laterality: N/A;  . CORONARY ANGIOPLASTY WITH STENT PLACEMENT  12/04/2016   "1 stent"  . CORONARY STENT INTERVENTION N/A 12/04/2016   Procedure: CORONARY STENT INTERVENTION;  Surgeon: Wellington Hampshire, MD;  Location: [place] CV LAB;  Service: Cardiovascular;  Laterality: N/A;  . ESOPHAGOGASTRODUODENOSCOPY N/A 10/21/2016   Procedure: ESOPHAGOGASTRODUODENOSCOPY (EGD);  Surgeon: Gatha Mayer, MD;  Location: [facility] ENDOSCOPY;  Service: Endoscopy;  Laterality: N/A;  . INSERTION OF DIALYSIS CATHETER Right 10/22/2016   Procedure: INSERTION OF TUNNELED DIALYSIS CATHETER;  Surgeon: Elam Dutch, MD;  Location: [place];  Service: Vascular;  Laterality: Right;  . LEFT HEART CATH AND CORONARY ANGIOGRAPHY N/A 10/23/2016   Procedure: LEFT HEART CATH AND CORONARY ANGIOGRAPHY;  Surgeon: Wellington Hampshire, MD;  Location: [place] CV LAB;  Service: Cardiovascular;  Laterality: N/A;  . LIGATION OF ARTERIOVENOUS  FISTULA Right 05/12/2017   Procedure: BANDING OF RIGHT UPPER ARM ARTERIOVENOUS  FISTULA USING 6MM X 10CM GORE TEX VASCULAR GRAFT;  Surgeon: Angelia Mould, MD;  Location: [facility];  Service: Vascular;  Laterality: Right;  . LIGATION OF ARTERIOVENOUS  FISTULA Right 07/03/2017   Procedure: LIGATION OF ARTERIOVENOUS  FISTULA RIGHT ARM;  Surgeon: Angelia Mould, MD;  Location: [facility];  Service: Vascular;  Laterality: Right;  . TONSILLECTOMY       reports that he quit smoking about 3 years ago. His smoking use included cigarettes. He has a 25.00 pack-year smoking history. He has never used smokeless tobacco. He reports that he  does not drink alcohol and does not use drugs.  No Known Allergies  Family History  Problem Relation Age of Onset  . Cancer Mother   . Cancer Father   . Heart attack Father   . Diabetes Father   . Hypertension Father   . Cancer Sister   . Cancer Brother   . Diabetes Brother   . Hypertension Brother   . Hypertension Daughter      Prior to Admission medications   Medication Sig Start Date End Date Taking? Authorizing Provider  allopurinol (ZYLOPRIM) 100 MG tablet Take 1 tablet (100 mg total) by mouth daily. 10/26/16   Florencia Reasons, MD  atorvastatin (LIPITOR) 80 MG tablet TAKE 1 TABLET(80 MG) BY MOUTH DAILY AT 6 PM 11/30/18   Rise Mu, PA-C  b complex vitamins tablet Take 1 tablet by mouth at bedtime.     [provider]  calcitRIOL (ROCALTROL) 0.25 MCG capsule Take 1 capsule (0.25 mcg total) by mouth Every Tuesday,Thursday,and Saturday with dialysis. 12/05/16   Leanor Kail, PA  clopidogrel (PLAVIX) 75 MG  tablet Take 1 tablet (75 mg total) by mouth daily. 08/03/18   Wellington Hampshire, MD  ezetimibe (ZETIA) 10 MG tablet Take 1 tablet (10 mg total) by mouth daily. 03/09/19 06/07/19  Theora Gianotti, NP  ferric citrate (AURYXIA) 1 GM 210 MG(Fe) tablet Take 420 mg by mouth 2 (two) times daily.     [provider]  isosorbide mononitrate (IMDUR) 30 MG 24 hr tablet Take 1 tablet (30 mg) by mouth once daily in the evening 07/29/18   Wellington Hampshire, MD  metoprolol succinate (TOPROL-XL) 25 MG 24 hr tablet Take 0.5 tablets (12.5 mg total) by mouth daily. Take 1 tablet (25 mg) by mouth once daily on non-dialysis days 09/24/19   Rise Mu, PA-C  midodrine (PROAMATINE) 10 MG tablet Take 1 tablet (10 mg total) by mouth 2 (two) times daily as needed (Twice daily on hemodialysis days). TWICE A DAY ONLY ON HEMODIALYSIS DAYS Patient taking differently: Take 10 mg by mouth 2 (two) times daily. TWICE A DAY ONLY ON HEMODIALYSIS DAYS 10/23/18   Wellington Hampshire, MD    nitroGLYCERIN (NITROSTAT) 0.4 MG SL tablet Place 1 tablet (0.4 mg total) under the tongue every 5 (five) minutes as needed for chest pain. 05/14/18   Nat Christen, MD  pantoprazole (PROTONIX) 40 MG tablet Take 40 mg by mouth daily. 03/30/18   [provider]  sucralfate (CARAFATE) 1 GM/10ML suspension Take 1 g by mouth 2 (two) times daily.     [provider]  tamsulosin (FLOMAX) 0.4 MG CAPS capsule Take 0.4 mg by mouth daily. 04/26/18   [provider]    Physical Exam: Vitals:   10/31/19 1526 10/31/19 1541 10/31/19 1900 10/31/19 1930  BP: (!) 188/66 (!) 198/60 (!) 180/67 (!) 194/57  Pulse: (!) 51  (!) 56 60  Resp: 18 18 19 12   Temp: 97.9 F (36.6 C)     TempSrc: Oral     SpO2: 100%  98% 100%      Constitutional: Acutely ill looking in obvious distress Vitals:   10/31/19 1526 10/31/19 1541 10/31/19 1900 10/31/19 1930  BP: (!) 188/66 (!) 198/60 (!) 180/67 (!) 194/57  Pulse: (!) 51  (!) 56 60  Resp: 18 18 19 12   Temp: 97.9 F (36.6 C)     TempSrc: Oral     SpO2: 100%  98% 100%   Eyes: PERRL, lids and conjunctivae normal ENMT: Mucous membranes are moist. Posterior pharynx clear of any exudate or lesions.Normal dentition.  Neck: normal, supple, no masses, no thyromegaly Respiratory: clear to auscultation bilaterally, no wheezing, no crackles. Normal respiratory effort. No accessory muscle use.  Cardiovascular: Bradycardic, no murmurs / rubs / gallops. No extremity edema. 2+ pedal pulses. No carotid bruits.  Abdomen: Distended, diffuse tenderness, no masses palpated. No hepatosplenomegaly. Bowel sounds positive.  Musculoskeletal: no clubbing / cyanosis. No joint deformity upper and lower extremities. Good ROM, no contractures. Normal muscle tone.  Skin: no rashes, lesions, ulcers. No induration Neurologic: CN 2-12 grossly intact. Sensation intact, DTR normal. Strength 5/5 in all 4.  Psychiatric: Normal judgment and insight. Alert and oriented x 3. Normal  mood.     Labs on Admission: I have personally reviewed following labs and imaging studies  CBC: Recent Labs  Lab 10/31/19 1253  WBC 8.9  HGB 11.7*  HCT 37.6*  MCV 102.7*  PLT 502*   Basic Metabolic Panel: Recent Labs  Lab 10/31/19 1253  NA 141  K 4.6  CL 98  CO2 26  GLUCOSE 132*  BUN 27*  CREATININE 6.53*  CALCIUM 9.7   GFR: CrCl cannot be calculated (Unknown ideal weight.). Liver Function Tests: Recent Labs  Lab 10/31/19 1253  AST 257*  ALT 151*  ALKPHOS 112  BILITOT 1.8*  PROT 8.8*  ALBUMIN 4.5   Recent Labs  Lab 10/31/19 1253  LIPASE >10,000*   No results for input(s): AMMONIA in the last 168 hours. Coagulation Profile: No results for input(s): INR, PROTIME in the last 168 hours. Cardiac Enzymes: No results for input(s): CKTOTAL, CKMB, CKMBINDEX, TROPONINI in the last 168 hours. BNP (last 3 results) No results for input(s): PROBNP in the last 8760 hours. HbA1C: No results for input(s): HGBA1C in the last 72 hours. CBG: No results for input(s): GLUCAP in the last 168 hours. Lipid Profile: No results for input(s): CHOL, HDL, LDLCALC, TRIG, CHOLHDL, LDLDIRECT in the last 72 hours. Thyroid Function Tests: No results for input(s): TSH, T4TOTAL, FREET4, T3FREE, THYROIDAB in the last 72 hours. Anemia Panel: No results for input(s): VITAMINB12, FOLATE, FERRITIN, TIBC, IRON, RETICCTPCT in the last 72 hours. Urine analysis:    Component Value Date/Time   COLORURINE YELLOW 10/20/2016 0255   APPEARANCEUR CLEAR 10/20/2016 0255   LABSPEC 1.010 10/20/2016 0255   LABSPEC 1.020 12/26/2009 1533   PHURINE 6.0 10/20/2016 0255   GLUCOSEU NEGATIVE 10/20/2016 0255   HGBUR NEGATIVE 10/20/2016 0255   BILIRUBINUR NEGATIVE 10/20/2016 0255   BILIRUBINUR Negative 12/26/2009 1533   KETONESUR NEGATIVE 10/20/2016 0255   PROTEINUR NEGATIVE 10/20/2016 0255   NITRITE NEGATIVE 10/20/2016 0255   LEUKOCYTESUR NEGATIVE 10/20/2016 0255   LEUKOCYTESUR Negative 12/26/2009  1533   Sepsis Labs: @LABRCNTIP (procalcitonin:4,lacticidven:4) )No results found for this or any previous visit (from the past 240 hour(s)).   Radiological Exams on Admission: CT ABDOMEN PELVIS WO CONTRAST  Result Date: 10/31/2019 CLINICAL DATA:  Nausea for 2 days and vomiting. Concern for pancreatitis. EXAM: CT ABDOMEN AND PELVIS WITHOUT CONTRAST TECHNIQUE: Multidetector CT imaging of the abdomen and pelvis was performed following the standard protocol without IV contrast. COMPARISON:  MR abdomen dated 06/02/2019 and CT abdomen pelvis dated 10/18/2016 FINDINGS: Lower chest: There is mild bilateral lower lobe atelectasis. Hepatobiliary: No focal liver abnormality is seen. There is a small amount of fat stranding surrounding the gallbladder. There is no definite gallbladder wall thickening. No biliary dilatation. Pancreas: There is a minimal amount of fat stranding near the inferior aspect of the pancreatic body (series 7, image 57). No pancreatic ductal dilatation. Spleen: Normal in size without focal abnormality. Adrenals/Urinary Tract: Adrenal glands are unremarkable. Kidneys are atrophic, without renal calculi, focal lesion, or hydronephrosis. Bladder is unremarkable. Stomach/Bowel: Stomach is within normal limits. Appendix appears normal. There is colonic diverticulosis without evidence of diverticulitis. no evidence of bowel wall thickening, distention, or inflammatory changes. Vascular/Lymphatic: Aortic atherosclerosis. No enlarged abdominal or pelvic lymph nodes. Reproductive: The prostate is enlarged, measuring 5.5 cm in transverse dimension. Other: No abdominal wall hernia or abnormality. No abdominopelvic ascites. Musculoskeletal: No acute or significant osseous findings. IMPRESSION: 1. There is a small amount of fat stranding surrounding the gallbladder without definite gallbladder wall thickening. Findings are nonspecific but could represent acute cholecystitis. 2. Minimal amount of fat stranding  near the pancreatic body is nonspecific but could represent early/mild acute pancreatitis. Aortic Atherosclerosis (ICD10-I70.0). Electronically Signed   By: Zerita Boers M.D.   On: 10/31/2019 19:12   DG Chest 1 View  Result Date: 10/31/2019 CLINICAL DATA:  Nausea. EXAM: CHEST  1 VIEW  COMPARISON:  Chest radiograph 05/14/2018. FINDINGS: Monitoring leads overlie the patient. Dialysis catheter tip projects over the superior vena cava. Stable cardiac and mediastinal contours. Aortic atherosclerosis. Bibasilar heterogeneous opacities. No pleural effusion or pneumothorax. Thoracic spine degenerative changes. IMPRESSION: Bibasilar atelectasis. Electronically Signed   By: Lovey Newcomer M.D.   On: 10/31/2019 16:38    EKG: Independently reviewed.  Sinus rhythm with left bundle branch block no significant changes otherwise.  Assessment/Plan Principal Problem:   Acute pancreatitis Active Problems:   Peripheral vascular disease, unspecified (HCC)   CAD (coronary artery disease)   ESRD (end stage renal disease) ([place])   Essential hypertension     #1 acute severe pancreatitis: Patient has evidence of biliary obstruction and possible acute cholecystitis.  We will admit the patient and initiate bowel rest.  Pain management and nausea management.  IV fluids but moderate since patient has end-stage renal disease.  Follow lipase levels.  Complete n.p.o.  Check ultrasound of the right upper quadrant check for possible gallstone or cholecystitis.  If confirmed may require surgical consultation.  Patient will need ERCP and possible cholecystectomy most likely.  #2 end-stage renal disease: On hemodialysis Tuesdays Thursdays Saturdays.  Patient was at dialysis yesterday.  #3 essential hypertension: Blood pressure had markedly elevated.  Probably related to pain now and he is known renal disease.  Will use IV medications to control blood pressure.  #4 peripheral vascular disease: Stable.  Will hold all home medications  as patient will be n.p.o.  #5 coronary artery disease: Stable.  No evidence of acute compensation    DVT prophylaxis: Subcu heparin Code Status: Full code Family Communication: Wife at bedside Disposition Plan: Home Consults called: Consult nephrology for hemodialysis Admission status: Inpatient  Severity of Illness: The appropriate patient status for this patient is INPATIENT. Inpatient status is judged to be reasonable and necessary in order to provide the required intensity of service to ensure the patient's safety. The patient's presenting symptoms, physical exam findings, and initial radiographic and laboratory data in the context of their chronic comorbidities is felt to place them at high risk for further clinical deterioration. Furthermore, it is not anticipated that the patient will be medically stable for discharge from the hospital within 2 midnights of admission. The following factors support the patient status of inpatient.   " The patient's presenting symptoms include abdominal pain and nausea vomiting. " The worrisome physical exam findings include diffuse abdominal tenderness. " The initial radiographic and laboratory data are worrisome because of lipase of more than 10,000. " The chronic co-morbidities include end-stage renal disease on hemodialysis.   * I certify that at the point of admission it is my clinical judgment that the patient will require inpatient hospital care spanning beyond 2 midnights from the point of admission due to high intensity of service, high risk for further deterioration and high frequency of surveillance required.Barbette Merino MD [facility] Pager [phone]  If 7PM-7AM, please contact night-coverage [email or web address] Password [facility]  10/31/2019, 7:44 PM

## 2019-11-01 ENCOUNTER — Encounter (HOSPITAL_COMMUNITY): Payer: Self-pay | Admitting: Internal Medicine

## 2019-11-01 ENCOUNTER — Other Ambulatory Visit: Payer: Self-pay

## 2019-11-01 ENCOUNTER — Telehealth: Payer: Self-pay

## 2019-11-01 DIAGNOSIS — K859 Acute pancreatitis without necrosis or infection, unspecified: Secondary | ICD-10-CM | POA: Diagnosis present

## 2019-11-01 LAB — COMPREHENSIVE METABOLIC PANEL
ALT: 114 U/L — ABNORMAL HIGH (ref 0–44)
AST: 94 U/L — ABNORMAL HIGH (ref 15–41)
Albumin: 4 g/dL (ref 3.5–5.0)
Alkaline Phosphatase: 97 U/L (ref 38–126)
Anion gap: 16 — ABNORMAL HIGH (ref 5–15)
BUN: 37 mg/dL — ABNORMAL HIGH (ref 8–23)
CO2: 26 mmol/L (ref 22–32)
Calcium: 9.2 mg/dL (ref 8.9–10.3)
Chloride: 98 mmol/L (ref 98–111)
Creatinine, Ser: 7.86 mg/dL — ABNORMAL HIGH (ref 0.61–1.24)
GFR calc Af Amer: 7 mL/min — ABNORMAL LOW (ref >60)
GFR calc non Af Amer: 6 mL/min — ABNORMAL LOW (ref >60)
Glucose, Bld: 96 mg/dL (ref 70–99)
Potassium: 3.9 mmol/L (ref 3.5–5.1)
Sodium: 140 mmol/L (ref 135–145)
Total Bilirubin: 0.8 mg/dL (ref 0.3–1.2)
Total Protein: 8 g/dL (ref 6.5–8.1)

## 2019-11-01 LAB — URINALYSIS, ROUTINE W REFLEX MICROSCOPIC
Bacteria, UA: NONE SEEN
Bilirubin Urine: NEGATIVE
Glucose, UA: NEGATIVE mg/dL
Hgb urine dipstick: NEGATIVE
Ketones, ur: NEGATIVE mg/dL
Leukocytes,Ua: NEGATIVE
Nitrite: NEGATIVE
Protein, ur: 100 mg/dL — AB
Specific Gravity, Urine: 1.019 (ref 1.005–1.030)
pH: 9 — ABNORMAL HIGH (ref 5.0–8.0)

## 2019-11-01 LAB — CBC
HCT: 35.1 % — ABNORMAL LOW (ref 39.0–52.0)
Hemoglobin: 11.2 g/dL — ABNORMAL LOW (ref 13.0–17.0)
MCH: 32.2 pg (ref 26.0–34.0)
MCHC: 31.9 g/dL (ref 30.0–36.0)
MCV: 100.9 fL — ABNORMAL HIGH (ref 80.0–100.0)
Platelets: 146 10*3/uL — ABNORMAL LOW (ref 150–400)
RBC: 3.48 MIL/uL — ABNORMAL LOW (ref 4.22–5.81)
RDW: 16.7 % — ABNORMAL HIGH (ref 11.5–15.5)
WBC: 8.8 10*3/uL (ref 4.0–10.5)
nRBC: 0 % (ref 0.0–0.2)

## 2019-11-01 LAB — LIPASE, BLOOD: Lipase: 1724 U/L — ABNORMAL HIGH (ref 11–51)

## 2019-11-01 MED ORDER — CHLORHEXIDINE GLUCONATE CLOTH 2 % EX PADS
6.0000 | MEDICATED_PAD | Freq: Every day | CUTANEOUS | Status: DC
  Administered 2019-11-02 – 2019-11-05 (×4): 6 via TOPICAL

## 2019-11-01 MED ORDER — SODIUM CHLORIDE 0.9 % IV SOLN
2.0000 g | INTRAVENOUS | Status: DC
  Administered 2019-11-01 – 2019-11-02 (×2): 2 g via INTRAVENOUS
  Filled 2019-11-01 (×4): qty 20

## 2019-11-01 NOTE — Telephone Encounter (Signed)
Call to patient to review labs.    Spoke to daughter [name], okay per DPR. She verbalized understanding and has no further questions at this time.    Advised pt to call for any further questions or concerns.  No further orders.  Confirmed upcoming appt for oct.

## 2019-11-01 NOTE — ED Notes (Signed)
Pt urinated in stretcher, complete bed change provided. Pt encouraged to call RN for help should he need to void. Pt voiced understand

## 2019-11-01 NOTE — Consult Note (Addendum)
Roger [name] [birth year]  [id number].    Requesting MD: Dr. Eulogio Bear Chief Complaint/Reason for Consult: Gallstone pancreatitis  HPI:  This is a 79 yo male with a history of ESRD on HD on TTS, HTN, CAD on plavix (last dose 9/92), diastolic dysfunction, prostate cancer, peripheral neuropathy, PAF, and H/O MI who was recently evaluated by cardiology for some chest pain during HD with some hypotension.  He just underwent an ECHO about 3 days ago which actually looks pretty good.    Yesterday, the patient developed significant mid abdominal pain that radiated through to his back with nausea and vomiting.  Denies diarrhea or constipation.  He denies anything that makes this better.  He denies fevers or chills.  He presented to the ED for evaluation.  He underwent a CT scan that revealed some stranding around the gallbladder and pancreatic head.  His lipase was over 10,000.  His LFTs were elevated with a TB of 1.8.  He had a follow up US that revealed gallstones.  He denies any ETOH use.  He has been admitted. Today his LFTs are trending down with a normal TB.  His lipase is down to 1,700.  He remains AF with a normal WBC.  We have been asked to see him.  No h/o prior abdominal surgery Former smoker - quit 2018 Lives at home alone Ambulates with walker or cane  ROS: Review of Systems  Constitutional: Negative.   Respiratory: Negative.   Cardiovascular: Negative.   Gastrointestinal: Positive for abdominal pain, nausea and vomiting. Negative for constipation and diarrhea.    Please see HPI, otherwise all other systems have been reviewed and are negative.  Family History  Problem Relation Age of Onset  . Cancer Mother   . Cancer Father   . Heart attack Father   . Diabetes Father   . Hypertension Father   . Cancer Sister   . Cancer Brother   . Diabetes Brother   . Hypertension Brother   . Hypertension Daughter     Past Medical History:  Diagnosis Date  . Anemia   . Anginal  pain ([place])   . Arthritis    "all over; bad in the legs" (12/04/2016)  . CAD (coronary artery disease)    a. NSTEMI in setting of anemia; b. 10/2016 MV: reversible ant, apical, inflat defect; c. 10/2016 Cath: LM nl, LAD 40p, 95m, D1 70, D2 80, RI min irregs, LCX 100ost, RCA 100p, 167m/d, RPDA fills via collats from LAD, RPAV small-->Initial conservative Rx in setting of GIB w/ plan for PCI LAD if H/H stable on ASA/Plavix;  d. 12/04/16 s/p PTCA and DES to mLAD; e. 09/2018 MV: EF 54%, no ischemia.  . Carotid disease, bilateral ([place])    a. 04/2017 U/S: RICA 42-68, RCCA >34, LICA 19-62.  Marland Kitchen Depression    situational   . Diastolic dysfunction    a. 10/2016 Echo: EF 55-60%, no rwma, Gr2 DD, triv MR, mildly dil LA; b. 07/2017 Echo: EF 60-65%, Gr1 DD. No rwma. AoV leaflet thickening (not felt to be SBE upon further review). Mild MR. Mildly dil LA.  Marland Kitchen ESRD on dialysis [facility])    "[place]; [address]; TTS usually; going to [facility] in [place], [place] right now while staying w/daughter" (12/04/2016)  . GERD (gastroesophageal reflux disease)   . GIB (gastrointestinal bleeding) 10/2011   a. 10/2016 3u PRBCs - EGD/Colonoscopy: angiodysplasia w/ diverticulosis. No apparent bleeding. Polypecotmy->tubular adenomas.  . Gout   . Headache  no migraines for years  . Heart murmur   . High cholesterol   . History of blood transfusion 10/18/2016   "related to anemia" (12/04/2016)  . History of kidney stones    years ago  . Hypertension   . Iron deficiency anemia   . Myocardial infarction ([place]) 10/18/2016  . Old MI (myocardial infarction)    "found evidence of this on 10/18/2016"  . PAF (paroxysmal atrial fibrillation) ([place])   . Peripheral vascular disease ([place])    BLE  . Prostate cancer ([place])    a. s/p seed implantation.  . Tobacco abuse     Past Surgical History:  Procedure Laterality Date  . AV FISTULA PLACEMENT Right 03/28/2017   Procedure: ARTERIOVENOUS (AV) BRACHIOCEPHALIC FISTULA CREATION;  Surgeon: Angelia Mould, MD;  Location: [facility];  Service: Vascular;  Laterality: Right;  . CARDIAC CATHETERIZATION  10/2016  . COLONOSCOPY WITH PROPOFOL N/A 10/21/2016   Procedure: COLONOSCOPY WITH PROPOFOL;  Surgeon: Gatha Mayer, MD;  Location: [facility] ENDOSCOPY;  Service: Endoscopy;  Laterality: N/A;  . CORONARY ANGIOPLASTY WITH STENT PLACEMENT  12/04/2016   "1 stent"  . CORONARY STENT INTERVENTION N/A 12/04/2016   Procedure: CORONARY STENT INTERVENTION;  Surgeon: Wellington Hampshire, MD;  Location: [place] CV LAB;  Service: Cardiovascular;  Laterality: N/A;  . ESOPHAGOGASTRODUODENOSCOPY N/A 10/21/2016   Procedure: ESOPHAGOGASTRODUODENOSCOPY (EGD);  Surgeon: Gatha Mayer, MD;  Location: [facility] ENDOSCOPY;  Service: Endoscopy;  Laterality: N/A;  . INSERTION OF DIALYSIS CATHETER Right 10/22/2016   Procedure: INSERTION OF TUNNELED DIALYSIS CATHETER;  Surgeon: Elam Dutch, MD;  Location: [facility];  Service: Vascular;  Laterality: Right;  . LEFT HEART CATH AND CORONARY ANGIOGRAPHY N/A 10/23/2016   Procedure: LEFT HEART CATH AND CORONARY ANGIOGRAPHY;  Surgeon: Wellington Hampshire, MD;  Location: [place] CV LAB;  Service: Cardiovascular;  Laterality: N/A;  . LIGATION OF ARTERIOVENOUS  FISTULA Right 05/12/2017   Procedure: BANDING OF RIGHT UPPER ARM ARTERIOVENOUS  FISTULA USING 6MM X 10CM GORE TEX VASCULAR GRAFT;  Surgeon: Angelia Mould, MD;  Location: [place];  Service: Vascular;  Laterality: Right;  . LIGATION OF ARTERIOVENOUS  FISTULA Right 07/03/2017   Procedure: LIGATION OF ARTERIOVENOUS  FISTULA RIGHT ARM;  Surgeon: Angelia Mould, MD;  Location: [place];  Service: Vascular;  Laterality: Right;  . TONSILLECTOMY      Social History:  reports that he quit smoking about 3 years ago. His smoking use included cigarettes. He has a 25.00 pack-year smoking history. He has never used smokeless tobacco. He reports that he does not drink alcohol and does not use drugs.  Allergies: No Known Allergies  (Not  in a hospital admission)    Physical Exam: Blood pressure (!) 165/58, pulse (!) 57, temperature 97.9 F (36.6 C), temperature source Oral, resp. rate 16, SpO2 98 %.  General: pleasant, WD, WN black male who is laying in bed in NAD HEENT: head is normocephalic, atraumatic.  Sclera are noninjected.  PERRL.  Ears and nose without any masses or lesions.  Mouth is pink and moist. Poor dentition Heart: regular, rate, and rhythm.  Normal s1,s2. +murmur.  Palpable radial and pedal pulses bilaterally Lungs: CTAB, no wheezes, rhonchi, or rales noted.  Respiratory effort nonlabored Abd: soft, ND, +BS, no masses, hernias, or organomegaly. TTP upper abdomen, mostly epigastric and RUQ with voluntary guarding MS: all 4 extremities are symmetrical with no cyanosis, clubbing, or edema. Skin: warm and dry with no masses, lesions, or rashes Neuro:  Cranial nerves 2-12 grossly intact, sensation is normal throughout Psych: A&Ox3 with an appropriate affect.   Results for orders placed or performed during the hospital encounter of 10/31/19 (from the past 48 hour(s))  Lipase, blood     Status: Abnormal   Collection Time: 10/31/19 12:53 PM  Result Value Ref Range   Lipase >10,000 (H) 11 - 51 U/L    Comment: RESULTS CONFIRMED BY MANUAL DILUTION Performed at [facility], [address] [place], [place] [address]   Comprehensive metabolic panel     Status: Abnormal   Collection Time: 10/31/19 12:53 PM  Result Value Ref Range   Sodium 141 135 - 145 mmol/L   Potassium 4.6 3.5 - 5.1 mmol/L   Chloride 98 98 - 111 mmol/L   CO2 26 22 - 32 mmol/L   Glucose, Bld 132 (H) 70 - 99 mg/dL    Comment: Glucose reference range applies only to samples taken after fasting for at least 8 hours.   BUN 27 (H) 8 - 23 mg/dL   Creatinine, Ser 6.53 (H) 0.61 - 1.24 mg/dL   Calcium 9.7 8.9 - 10.3 mg/dL   Total Protein 8.8 (H) 6.5 - 8.1 g/dL   Albumin 4.5 3.5 - 5.0 g/dL   AST 257 (H) 15 - 41 U/L   ALT 151 (H) 0 - 44 U/L    Alkaline Phosphatase 112 38 - 126 U/L   Total Bilirubin 1.8 (H) 0.3 - 1.2 mg/dL   GFR calc non Af Amer 8 (L) >60 mL/min   GFR calc Af Amer 9 (L) >60 mL/min   Anion gap 17 (H) 5 - 15    Comment: Performed at [facility] [address], [place], [place] [address]  CBC     Status: Abnormal   Collection Time: 10/31/19 12:53 PM  Result Value Ref Range   WBC 8.9 4.0 - 10.5 K/uL   RBC 3.66 (L) 4.22 - 5.81 MIL/uL   Hemoglobin 11.7 (L) 13.0 - 17.0 g/dL   HCT 37.6 (L) 39 - 52 %   MCV 102.7 (H) 80.0 - 100.0 fL   MCH 32.0 26.0 - 34.0 pg   MCHC 31.1 30.0 - 36.0 g/dL   RDW 16.9 (H) 11.5 - 15.5 %   Platelets 142 (L) 150 - 400 K/uL   nRBC 0.0 0.0 - 0.2 %    Comment: Performed at [facility] [address] [place], [place] [address]  Troponin I (High Sensitivity)     Status: None   Collection Time: 10/31/19 12:53 PM  Result Value Ref Range   Troponin I (High Sensitivity) 12 <18 ng/L    Comment: (NOTE) Elevated high sensitivity troponin I (hsTnI) values and significant  changes across serial measurements may suggest ACS but many other  chronic and acute conditions are known to elevate hsTnI results.  Refer to the "Links" section for chest pain algorithms and additional  guidance. Performed at [facility], [place] [address] [place], [place] [address]   Lactate dehydrogenase     Status: Abnormal   Collection Time: 10/31/19  6:15 PM  Result Value Ref Range   LDH 262 (H) 98 - 192 U/L    Comment: Performed at [facility] [address] [place], [place] [address]  Troponin I (High Sensitivity)     Status: Abnormal   Collection Time: 10/31/19  6:15 PM  Result Value Ref Range   Troponin I (High Sensitivity) 20 (H) <18  ng/L    Comment: (NOTE) Elevated high sensitivity troponin I (hsTnI) values and significant  changes across serial measurements may suggest ACS but many other  chronic and acute conditions are known to elevate hsTnI results.  Refer to the "Links"  section for chest pain algorithms and additional  guidance. Performed at [facility], [place] [address], [place], [place] [address]   SARS Coronavirus 2 by RT PCR (hospital order, performed in [facility] hospital lab) Nasopharyngeal Nasopharyngeal Swab     Status: None   Collection Time: 10/31/19  7:25 PM   Specimen: Nasopharyngeal Swab  Result Value Ref Range   SARS Coronavirus 2 NEGATIVE NEGATIVE    Comment: (NOTE) SARS-CoV-2 target nucleic acids are NOT DETECTED.  The SARS-CoV-2 RNA is generally detectable in upper and lower respiratory specimens during the acute phase of infection. The lowest concentration of SARS-CoV-2 viral copies this assay can detect is 250 copies / mL. A negative result does not preclude SARS-CoV-2 infection and should not be used as the sole basis for treatment or other patient management decisions.  A negative result may occur with improper specimen collection / handling, submission of specimen other than nasopharyngeal swab, presence of viral mutation(s) within the areas targeted by this assay, and inadequate number of viral copies (<250 copies / mL). A negative result must be combined with clinical observations, patient history, and epidemiological information.  Fact Sheet for Patients:   StrictlyIdeas.no  Fact Sheet for Healthcare Providers: BankingDealers.co.za  This test is not yet approved or  cleared by the [place] FDA and has been authorized for detection and/or diagnosis of SARS-CoV-2 by FDA under an Emergency Use Authorization (EUA).  This EUA will remain in effect (meaning this test can be used) for the duration of the COVID-19 declaration under Section 564(b)(1) of the Act, 21 U.S.C. section 360bbb-3(b)(1), unless the authorization is terminated or revoked sooner.  Performed at [facility], [place] [address], [place], [place] [address]   Urinalysis, Routine w reflex  microscopic     Status: Abnormal   Collection Time: 11/01/19  4:51 AM  Result Value Ref Range   Color, Urine YELLOW YELLOW   APPearance CLEAR CLEAR   Specific Gravity, Urine 1.019 1.005 - 1.030   pH 9.0 (H) 5.0 - 8.0   Glucose, UA NEGATIVE NEGATIVE mg/dL   Hgb urine dipstick NEGATIVE NEGATIVE   Bilirubin Urine NEGATIVE NEGATIVE   Ketones, ur NEGATIVE NEGATIVE mg/dL   Protein, ur 100 (A) NEGATIVE mg/dL   Nitrite NEGATIVE NEGATIVE   Leukocytes,Ua NEGATIVE NEGATIVE   RBC / HPF 0-5 0 - 5 RBC/hpf   WBC, UA 0-5 0 - 5 WBC/hpf   Bacteria, UA NONE SEEN NONE SEEN   Squamous Epithelial / LPF 0-5 0 - 5    Comment: Performed at [facility], [place] [address] [place], [place] [address]  Comprehensive metabolic panel     Status: Abnormal   Collection Time: 11/01/19  4:51 AM  Result Value Ref Range   Sodium 140 135 - 145 mmol/L   Potassium 3.9 3.5 - 5.1 mmol/L   Chloride 98 98 - 111 mmol/L   CO2 26 22 - 32 mmol/L   Glucose, Bld 96 70 - 99 mg/dL    Comment: Glucose reference range applies only to samples taken after fasting for at least 8 hours.   BUN 37 (H) 8 - 23 mg/dL   Creatinine, Ser 7.86 (H) 0.61 - 1.24 mg/dL   Calcium 9.2  8.9 - 10.3 mg/dL   Total Protein 8.0 6.5 - 8.1 g/dL   Albumin 4.0 3.5 - 5.0 g/dL   AST 94 (H) 15 - 41 U/L   ALT 114 (H) 0 - 44 U/L   Alkaline Phosphatase 97 38 - 126 U/L   Total Bilirubin 0.8 0.3 - 1.2 mg/dL   GFR calc non Af Amer 6 (L) >60 mL/min   GFR calc Af Amer 7 (L) >60 mL/min   Anion gap 16 (H) 5 - 15    Comment: Performed at [facility] [address], [place], [place] [address]  CBC     Status: Abnormal   Collection Time: 11/01/19  4:51 AM  Result Value Ref Range   WBC 8.8 4.0 - 10.5 K/uL   RBC 3.48 (L) 4.22 - 5.81 MIL/uL   Hemoglobin 11.2 (L) 13.0 - 17.0 g/dL   HCT 35.1 (L) 39 - 52 %   MCV 100.9 (H) 80.0 - 100.0 fL   MCH 32.2 26.0 - 34.0 pg   MCHC 31.9 30.0 - 36.0 g/dL   RDW 16.7 (H) 11.5 - 15.5 %   Platelets 146 (L) 150 - 400 K/uL    nRBC 0.0 0.0 - 0.2 %    Comment: Performed at [facility], [place] [address] [place], [place] [address]  Lipase, blood     Status: Abnormal   Collection Time: 11/01/19  4:51 AM  Result Value Ref Range   Lipase 1,724 (H) 11 - 51 U/L    Comment: RESULTS CONFIRMED BY MANUAL DILUTION Performed at [facility], [place] [address] [place], [place] [address]    CT ABDOMEN PELVIS WO CONTRAST  Result Date: 10/31/2019 CLINICAL DATA:  Nausea for 2 days and vomiting. Concern for pancreatitis. EXAM: CT ABDOMEN AND PELVIS WITHOUT CONTRAST TECHNIQUE: Multidetector CT imaging of the abdomen and pelvis was performed following the standard protocol without IV contrast. COMPARISON:  MR abdomen dated 06/02/2019 and CT abdomen pelvis dated 10/18/2016 FINDINGS: Lower chest: There is mild bilateral lower lobe atelectasis. Hepatobiliary: No focal liver abnormality is seen. There is a small amount of fat stranding surrounding the gallbladder. There is no definite gallbladder wall thickening. No biliary dilatation. Pancreas: There is a minimal amount of fat stranding near the inferior aspect of the pancreatic body (series 7, image 57). No pancreatic ductal dilatation. Spleen: Normal in size without focal abnormality. Adrenals/Urinary Tract: Adrenal glands are unremarkable. Kidneys are atrophic, without renal calculi, focal lesion, or hydronephrosis. Bladder is unremarkable. Stomach/Bowel: Stomach is within normal limits. Appendix appears normal. There is colonic diverticulosis without evidence of diverticulitis. no evidence of bowel wall thickening, distention, or inflammatory changes. Vascular/Lymphatic: Aortic atherosclerosis. No enlarged abdominal or pelvic lymph nodes. Reproductive: The prostate is enlarged, measuring 5.5 cm in transverse dimension. Other: No abdominal wall hernia or abnormality. No abdominopelvic ascites. Musculoskeletal: No acute or significant osseous findings. IMPRESSION: 1. There is a small  amount of fat stranding surrounding the gallbladder without definite gallbladder wall thickening. Findings are nonspecific but could represent acute cholecystitis. 2. Minimal amount of fat stranding near the pancreatic body is nonspecific but could represent early/mild acute pancreatitis. Aortic Atherosclerosis (ICD10-I70.0). Electronically Signed   By: Zerita Boers M.D.   On: 10/31/2019 19:12   DG Chest 1 View  Result Date: 10/31/2019 CLINICAL DATA:  Nausea. EXAM: CHEST  1 VIEW COMPARISON:  Chest radiograph 05/14/2018. FINDINGS: Monitoring leads overlie the patient. Dialysis catheter tip projects over the superior vena cava. Stable cardiac  and mediastinal contours. Aortic atherosclerosis. Bibasilar heterogeneous opacities. No pleural effusion or pneumothorax. Thoracic spine degenerative changes. IMPRESSION: Bibasilar atelectasis. Electronically Signed   By: Lovey Newcomer M.D.   On: 10/31/2019 16:38   US Abdomen Limited RUQ  Result Date: 10/31/2019 CLINICAL DATA:  Pancreatitis. EXAM: ULTRASOUND ABDOMEN LIMITED RIGHT UPPER QUADRANT COMPARISON:  None. FINDINGS: Gallbladder: Echogenic shadowing gallstones and gallbladder sludge are seen within the gallbladder lumen. Gallbladder wall thickening is noted (7.4 mm). No sonographic Murphy sign noted by sonographer. Common bile duct: Diameter: 6.2 mm Liver: No focal lesion identified. Within normal limits in parenchymal echogenicity. Portal vein is patent on color Doppler imaging with normal direction of blood flow towards the liver. Other: None. IMPRESSION: Cholelithiasis, gallbladder sludge and gallbladder wall thickening in the absence of a positive sonographic Murphy sign. Further correlation with a nuclear medicine hepatobiliary scan is recommended, as acute cholecystitis cannot completely be excluded. Electronically Signed   By: Virgina Norfolk M.D.   On: 10/31/2019 21:28   Anti-infectives (From admission, onward)   Start     Dose/Rate Route Frequency  Ordered Stop   11/01/19 1030  cefTRIAXone (ROCEPHIN) 2 g in sodium chloride 0.9 % 100 mL IVPB     Discontinue     2 g 200 mL/hr over 30 Minutes Intravenous Every 24 hours 11/01/19 1026          Assessment/Plan ESRD on HD CAD, prior MI - recent Echo is stable.  Hold plavix (last dose 8/14) Prostate cancer PAF HTN Peripheral neuropathy  Gallstone pancreatitis The patient appears to likely have passed a stone causing his pancreatitis given his elevation in his LFTs.  We will need to wait for his pancreatitis to resolve as well as his plavix to washout and then plan for lap chole.  This will likely be mid to later in the week.  Remain NPO for now while pancreatitis is improving.  Continue IVF hydration.  We will follow along.  FEN - NPO, IVF VTE - sq heparin, hold plavix ID - Rocephin 8/16>>   Margie Billet, [facility] Surgery 11/01/2019, 11:09 AM Please see Amion for pager number during day hours 7:00am-4:30pm or 7:00am -11:30am on weekends

## 2019-11-01 NOTE — Progress Notes (Signed)
New Admission Note:  Arrival Method: Stretcher Mental Orientation: Alert and oriented x4 Telemetry: Box 08 Assessment: Completed Skin: Warm and dry. RUE swollen.  IV: NSL Pain: Denies  Tubes: N/A Safety Measures: Safety Fall Prevention Plan initiated.  Admission: Completed 5 M  Orientation: Patient has been orientated to the room, unit and the staff. Welcome booklet given.  Family: Dtr  Orders have been reviewed and implemented. Will continue to monitor the patient. Call light has been placed within reach and bed alarm has been activated.   Sima Matas BSN, RN  Phone Number: [phone]

## 2019-11-01 NOTE — Consult Note (Signed)
[facility] Renal Consultation Note    Indication for Consultation:  Management of ESRD/hemodialysis, anemia, hypertension/volume, and secondary hyperparathyroidism. PCP:  HPI: Roger [name] is a 79 y.o. male with ESRD, CAD, HTN, Hx prostate cancer, pA-fib, Hx GIB, GERD, and PVD who is being admitted with acute pancreatitis.  Reports that developed abdominal pain, nausea, and vomiting starting on the morning of 8/15. Pain started in lower abdomen, then progressed to epigastric area and lower chest. He denied dyspnea, fever or chills. He presented to ED late last night. In ED, vitals were stable. He was not febrile nor hypoxic. Labs showed Na 141, K 4.6, Ca 9.7, Alb 4.5. Lipase > 10,000, AST 257, ALT 151, T-bili 1.8.  He denies having Dx diabetes. Does not drink alcohol. Denies new medications. Abd CT showed fat stranding around the pancreas as well as gallbladder. RUQ US showed cholelithiasis + gallbladder sludge and wall thickening.  Today, he was seen in his ED bed - awaiting room upstairs. Abd pain is much better today. No further vomiting. No CP, dyspnea. Lipase and LFTs are trending down - Lipase 1724 now.  Dialyzes on TTS schedule at [facility]. He will be due for dialysis tomorrow. Uses TDC in R chest as his access.  Past Medical History:  Diagnosis Date  . Anemia   . Anginal pain ([place])   . Arthritis    "all over; bad in the legs" (12/04/2016)  . CAD (coronary artery disease)    a. NSTEMI in setting of anemia; b. 10/2016 MV: reversible ant, apical, inflat defect; c. 10/2016 Cath: LM nl, LAD 40p, 69m, D1 70, D2 80, RI min irregs, LCX 100ost, RCA 100p, 168m/d, RPDA fills via collats from LAD, RPAV small-->Initial conservative Rx in setting of GIB w/ plan for PCI LAD if H/H stable on ASA/Plavix;  d. 12/04/16 s/p PTCA and DES to mLAD; e. 09/2018 MV: EF 54%, no ischemia.  . Carotid disease, bilateral ([place])    a. 04/2017 U/S: RICA 57-26, RCCA >20, LICA 35-59.  Marland Kitchen  Depression    situational   . Diastolic dysfunction    a. 10/2016 Echo: EF 55-60%, no rwma, Gr2 DD, triv MR, mildly dil LA; b. 07/2017 Echo: EF 60-65%, Gr1 DD. No rwma. AoV leaflet thickening (not felt to be SBE upon further review). Mild MR. Mildly dil LA.  Marland Kitchen ESRD on dialysis [facility])    "[place]; [address]; TTS usually; going to [facility] in [place], [place] right now while staying w/daughter" (12/04/2016)  . GERD (gastroesophageal reflux disease)   . GIB (gastrointestinal bleeding) 10/2011   a. 10/2016 3u PRBCs - EGD/Colonoscopy: angiodysplasia w/ diverticulosis. No apparent bleeding. Polypecotmy->tubular adenomas.  . Gout   . Headache    no migraines for years  . Heart murmur   . High cholesterol   . History of blood transfusion 10/18/2016   "related to anemia" (12/04/2016)  . History of kidney stones    years ago  . Hypertension   . Iron deficiency anemia   . Myocardial infarction ([place]) 10/18/2016  . Old MI (myocardial infarction)    "found evidence of this on 10/18/2016"  . PAF (paroxysmal atrial fibrillation) ([place])   . Peripheral vascular disease ([place])    BLE  . Prostate cancer ([place])    a. s/p seed implantation.  . Tobacco abuse    Past Surgical History:  Procedure Laterality Date  . AV FISTULA PLACEMENT Right 03/28/2017   Procedure: ARTERIOVENOUS (AV) BRACHIOCEPHALIC FISTULA CREATION;  Surgeon: Scot Dock,  Judeth Cornfield, MD;  Location: [place];  Service: Vascular;  Laterality: Right;  . CARDIAC CATHETERIZATION  10/2016  . COLONOSCOPY WITH PROPOFOL N/A 10/21/2016   Procedure: COLONOSCOPY WITH PROPOFOL;  Surgeon: Gatha Mayer, MD;  Location: [facility] ENDOSCOPY;  Service: Endoscopy;  Laterality: N/A;  . CORONARY ANGIOPLASTY WITH STENT PLACEMENT  12/04/2016   "1 stent"  . CORONARY STENT INTERVENTION N/A 12/04/2016   Procedure: CORONARY STENT INTERVENTION;  Surgeon: Wellington Hampshire, MD;  Location: [place] CV LAB;  Service: Cardiovascular;  Laterality: N/A;  . ESOPHAGOGASTRODUODENOSCOPY N/A  10/21/2016   Procedure: ESOPHAGOGASTRODUODENOSCOPY (EGD);  Surgeon: Gatha Mayer, MD;  Location: [facility] ENDOSCOPY;  Service: Endoscopy;  Laterality: N/A;  . INSERTION OF DIALYSIS CATHETER Right 10/22/2016   Procedure: INSERTION OF TUNNELED DIALYSIS CATHETER;  Surgeon: Elam Dutch, MD;  Location: [place];  Service: Vascular;  Laterality: Right;  . LEFT HEART CATH AND CORONARY ANGIOGRAPHY N/A 10/23/2016   Procedure: LEFT HEART CATH AND CORONARY ANGIOGRAPHY;  Surgeon: Wellington Hampshire, MD;  Location: [place] CV LAB;  Service: Cardiovascular;  Laterality: N/A;  . LIGATION OF ARTERIOVENOUS  FISTULA Right 05/12/2017   Procedure: BANDING OF RIGHT UPPER ARM ARTERIOVENOUS  FISTULA USING 6MM X 10CM GORE TEX VASCULAR GRAFT;  Surgeon: Angelia Mould, MD;  Location: [place];  Service: Vascular;  Laterality: Right;  . LIGATION OF ARTERIOVENOUS  FISTULA Right 07/03/2017   Procedure: LIGATION OF ARTERIOVENOUS  FISTULA RIGHT ARM;  Surgeon: Angelia Mould, MD;  Location: [place];  Service: Vascular;  Laterality: Right;  . TONSILLECTOMY     Family History  Problem Relation Age of Onset  . Cancer Mother   . Cancer Father   . Heart attack Father   . Diabetes Father   . Hypertension Father   . Cancer Sister   . Cancer Brother   . Diabetes Brother   . Hypertension Brother   . Hypertension Daughter    Social History:  reports that he quit smoking about 3 years ago. His smoking use included cigarettes. He has a 25.00 pack-year smoking history. He has never used smokeless tobacco. He reports that he does not drink alcohol and does not use drugs.  ROS: As per HPI otherwise negative.  Physical Exam: Vitals:   11/01/19 0456 11/01/19 0550 11/01/19 0848 11/01/19 0902  BP:  (!) 180/62 (!) 171/60 (!) 165/58  Pulse: (!) 52 61 (!) 57 (!) 57  Resp: 19 (!) 25 17 16   Temp:      TempSrc:      SpO2: 100% 98% 97% 98%     General: Well developed, well nourished, in no acute distress. Head: Normocephalic,  atraumatic, sclera non-icteric, mucus membranes are moist. Neck: Supple without lymphadenopathy/masses. JVD not elevated. Lungs: Clear bilaterally to auscultation without wheezes, rales, or rhonchi. Breathing is unlabored. Heart: RRR with normal S1, S2. No murmurs, rubs, or gallops appreciated. Abdomen: Soft, non-distended. Musculoskeletal:  Strength and tone appear normal for age. Lower extremities: No edema or ischemic changes, no open wounds. Neuro: Alert and oriented X 3. Moves all extremities spontaneously. Psych:  Responds to questions appropriately with a normal affect. Dialysis Access: TDC in R chest  No Known Allergies Prior to Admission medications   Medication Sig Start Date End Date Taking? Authorizing Provider  allopurinol (ZYLOPRIM) 100 MG tablet Take 1 tablet (100 mg total) by mouth daily. 10/26/16  Yes Florencia Reasons, MD  atorvastatin (LIPITOR) 80 MG tablet TAKE 1 TABLET(80 MG) BY MOUTH DAILY AT 6  PM Patient taking differently: Take 80 mg by mouth every evening.  11/30/18  Yes Dunn, Areta Haber, PA-C  b complex vitamins tablet Take 1 tablet by mouth at bedtime.    Yes [provider]  calcitRIOL (ROCALTROL) 0.25 MCG capsule Take 1 capsule (0.25 mcg total) by mouth Every Tuesday,Thursday,and Saturday with dialysis. 12/05/16  Yes Bhagat, Bhavinkumar, PA  clopidogrel (PLAVIX) 75 MG tablet Take 1 tablet (75 mg total) by mouth daily. 08/03/18  Yes Wellington Hampshire, MD  ezetimibe (ZETIA) 10 MG tablet Take 1 tablet (10 mg total) by mouth daily. 03/09/19 10/31/19 Yes Theora Gianotti, NP  ferric citrate (AURYXIA) 1 GM 210 MG(Fe) tablet Take 420 mg by mouth 2 (two) times daily.    Yes [provider]  isosorbide mononitrate (IMDUR) 30 MG 24 hr tablet Take 1 tablet (30 mg) by mouth once daily in the evening 07/29/18  Yes Wellington Hampshire, MD  metoprolol succinate (TOPROL-XL) 25 MG 24 hr tablet Take 0.5 tablets (12.5 mg total) by mouth daily. Take 1 tablet (25 mg) by mouth  once daily on non-dialysis days 09/24/19  Yes Dunn, Areta Haber, PA-C  midodrine (PROAMATINE) 10 MG tablet Take 1 tablet (10 mg total) by mouth 2 (two) times daily as needed (Twice daily on hemodialysis days). TWICE A DAY ONLY ON HEMODIALYSIS DAYS Patient taking differently: Take 10 mg by mouth every evening. TWICE A DAY ONLY ON HEMODIALYSIS DAYS 10/23/18  Yes Wellington Hampshire, MD  nitroGLYCERIN (NITROSTAT) 0.4 MG SL tablet Place 1 tablet (0.4 mg total) under the tongue every 5 (five) minutes as needed for chest pain. 05/14/18  Yes Nat Christen, MD  pantoprazole (PROTONIX) 40 MG tablet Take 40 mg by mouth 2 (two) times daily.  03/30/18  Yes [provider]  sucralfate (CARAFATE) 1 GM/10ML suspension Take 1 g by mouth 2 (two) times daily.    Yes [provider]  tamsulosin (FLOMAX) 0.4 MG CAPS capsule Take 0.4 mg by mouth daily. 04/26/18  Yes [provider]   Current Facility-Administered Medications  Medication Dose Route Frequency Provider Last Rate Last Admin  . cefTRIAXone (ROCEPHIN) 2 g in sodium chloride 0.9 % 100 mL IVPB  2 g Intravenous Q24H Vann, Jessica U, DO 200 mL/hr at 11/01/19 1117 2 g at 11/01/19 1117  . heparin injection 5,000 Units  5,000 Units Subcutaneous Q8H Elwyn Reach, MD   5,000 Units at 11/01/19 0913  . hydrALAZINE (APRESOLINE) injection 5 mg  5 mg Intravenous Q6H PRN Elwyn Reach, MD   5 mg at 10/31/19 2036  . HYDROmorphone (DILAUDID) injection 1 mg  1 mg Intravenous Q2H PRN Jonelle Sidle, Mohammad L, MD      . iohexol (OMNIPAQUE) 300 MG/ML solution 100 mL  100 mL Intravenous Once PRN Silvestre Gunner, MD      . ondansetron (ZOFRAN) tablet 4 mg  4 mg Oral Q6H PRN Elwyn Reach, MD       Or  . ondansetron (ZOFRAN) injection 4 mg  4 mg Intravenous Q6H PRN Elwyn Reach, MD       Current Outpatient Medications  Medication Sig Dispense Refill  . allopurinol (ZYLOPRIM) 100 MG tablet Take 1 tablet (100 mg total) by mouth daily. 30 tablet 0  .  atorvastatin (LIPITOR) 80 MG tablet TAKE 1 TABLET(80 MG) BY MOUTH DAILY AT 6 PM (Patient taking differently: Take 80 mg by mouth every evening. ) 90 tablet 0  . b complex vitamins tablet Take 1 tablet  by mouth at bedtime.     . calcitRIOL (ROCALTROL) 0.25 MCG capsule Take 1 capsule (0.25 mcg total) by mouth Every Tuesday,Thursday,and Saturday with dialysis. 30 capsule 0  . clopidogrel (PLAVIX) 75 MG tablet Take 1 tablet (75 mg total) by mouth daily. 90 tablet 0  . ezetimibe (ZETIA) 10 MG tablet Take 1 tablet (10 mg total) by mouth daily. 90 tablet 3  . ferric citrate (AURYXIA) 1 GM 210 MG(Fe) tablet Take 420 mg by mouth 2 (two) times daily.     . isosorbide mononitrate (IMDUR) 30 MG 24 hr tablet Take 1 tablet (30 mg) by mouth once daily in the evening    . metoprolol succinate (TOPROL-XL) 25 MG 24 hr tablet Take 0.5 tablets (12.5 mg total) by mouth daily. Take 1 tablet (25 mg) by mouth once daily on non-dialysis days 45 tablet 3  . midodrine (PROAMATINE) 10 MG tablet Take 1 tablet (10 mg total) by mouth 2 (two) times daily as needed (Twice daily on hemodialysis days). TWICE A DAY ONLY ON HEMODIALYSIS DAYS (Patient taking differently: Take 10 mg by mouth every evening. TWICE A DAY ONLY ON HEMODIALYSIS DAYS) 180 tablet 3  . nitroGLYCERIN (NITROSTAT) 0.4 MG SL tablet Place 1 tablet (0.4 mg total) under the tongue every 5 (five) minutes as needed for chest pain. 30 tablet 0  . pantoprazole (PROTONIX) 40 MG tablet Take 40 mg by mouth 2 (two) times daily.     . sucralfate (CARAFATE) 1 GM/10ML suspension Take 1 g by mouth 2 (two) times daily.     . tamsulosin (FLOMAX) 0.4 MG CAPS capsule Take 0.4 mg by mouth daily.     Labs: Basic Metabolic Panel: Recent Labs  Lab 10/31/19 1253 11/01/19 0451  NA 141 140  K 4.6 3.9  CL 98 98  CO2 26 26  GLUCOSE 132* 96  BUN 27* 37*  CREATININE 6.53* 7.86*  CALCIUM 9.7 9.2   Liver Function Tests: Recent Labs  Lab 10/31/19 1253 11/01/19 0451  AST 257* 94*   ALT 151* 114*  ALKPHOS 112 97  BILITOT 1.8* 0.8  PROT 8.8* 8.0  ALBUMIN 4.5 4.0   Recent Labs  Lab 10/31/19 1253 11/01/19 0451  LIPASE >10,000* 1,724*   CBC: Recent Labs  Lab 10/31/19 1253 11/01/19 0451  WBC 8.9 8.8  HGB 11.7* 11.2*  HCT 37.6* 35.1*  MCV 102.7* 100.9*  PLT 142* 146*   Studies/Results: CT ABDOMEN PELVIS WO CONTRAST  Result Date: 10/31/2019 CLINICAL DATA:  Nausea for 2 days and vomiting. Concern for pancreatitis. EXAM: CT ABDOMEN AND PELVIS WITHOUT CONTRAST TECHNIQUE: Multidetector CT imaging of the abdomen and pelvis was performed following the standard protocol without IV contrast. COMPARISON:  MR abdomen dated 06/02/2019 and CT abdomen pelvis dated 10/18/2016 FINDINGS: Lower chest: There is mild bilateral lower lobe atelectasis. Hepatobiliary: No focal liver abnormality is seen. There is a small amount of fat stranding surrounding the gallbladder. There is no definite gallbladder wall thickening. No biliary dilatation. Pancreas: There is a minimal amount of fat stranding near the inferior aspect of the pancreatic body (series 7, image 57). No pancreatic ductal dilatation. Spleen: Normal in size without focal abnormality. Adrenals/Urinary Tract: Adrenal glands are unremarkable. Kidneys are atrophic, without renal calculi, focal lesion, or hydronephrosis. Bladder is unremarkable. Stomach/Bowel: Stomach is within normal limits. Appendix appears normal. There is colonic diverticulosis without evidence of diverticulitis. no evidence of bowel wall thickening, distention, or inflammatory changes. Vascular/Lymphatic: Aortic atherosclerosis. No enlarged abdominal or pelvic lymph nodes.  Reproductive: The prostate is enlarged, measuring 5.5 cm in transverse dimension. Other: No abdominal wall hernia or abnormality. No abdominopelvic ascites. Musculoskeletal: No acute or significant osseous findings. IMPRESSION: 1. There is a small amount of fat stranding surrounding the gallbladder  without definite gallbladder wall thickening. Findings are nonspecific but could represent acute cholecystitis. 2. Minimal amount of fat stranding near the pancreatic body is nonspecific but could represent early/mild acute pancreatitis. Aortic Atherosclerosis (ICD10-I70.0). Electronically Signed   By: Zerita Boers M.D.   On: 10/31/2019 19:12   DG Chest 1 View  Result Date: 10/31/2019 CLINICAL DATA:  Nausea. EXAM: CHEST  1 VIEW COMPARISON:  Chest radiograph 05/14/2018. FINDINGS: Monitoring leads overlie the patient. Dialysis catheter tip projects over the superior vena cava. Stable cardiac and mediastinal contours. Aortic atherosclerosis. Bibasilar heterogeneous opacities. No pleural effusion or pneumothorax. Thoracic spine degenerative changes. IMPRESSION: Bibasilar atelectasis. Electronically Signed   By: Lovey Newcomer M.D.   On: 10/31/2019 16:38   US Abdomen Limited RUQ  Result Date: 10/31/2019 CLINICAL DATA:  Pancreatitis. EXAM: ULTRASOUND ABDOMEN LIMITED RIGHT UPPER QUADRANT COMPARISON:  None. FINDINGS: Gallbladder: Echogenic shadowing gallstones and gallbladder sludge are seen within the gallbladder lumen. Gallbladder wall thickening is noted (7.4 mm). No sonographic Murphy sign noted by sonographer. Common bile duct: Diameter: 6.2 mm Liver: No focal lesion identified. Within normal limits in parenchymal echogenicity. Portal vein is patent on color Doppler imaging with normal direction of blood flow towards the liver. Other: None. IMPRESSION: Cholelithiasis, gallbladder sludge and gallbladder wall thickening in the absence of a positive sonographic Murphy sign. Further correlation with a nuclear medicine hepatobiliary scan is recommended, as acute cholecystitis cannot completely be excluded. Electronically Signed   By: Virgina Norfolk M.D.   On: 10/31/2019 21:28   Dialysis Orders:  TTS @ [facility] -> full HD 8/14 4hr, 400/A1.5, EDW 80.5kg, 3K/2Ca, UFP #4, TDC, heparin 4000 bolus -  Mircera 16mcg IV q 4 wk (last 8/5) - Calcitriol 2.30mcg PO q HD  Assessment/Plan: 1.  Acute Pancreatitis: Gallstone seems to be likely trigger. LFTs and Lipase improving s/p IVF + Ceftraixone. No EtOH nor diabetes. Stones in GB on [place]. Surgery consulted - plan for lap chole later this week. 2.  ESRD: Continue HD per TTS schedule - HD tomorrow. No heparin. 3.  Hypertension/volume: BP high, but no edema. Follow on home labs and with next HD. 4.  Anemia of ESRD: Hgb 11.2 - not due for ESA yet. 5.  Metabolic bone disease: Ca ok, Phos pending.  6.  PVD 7.  CAD: Plavix on hold.  Veneta Penton, PA-C 11/01/2019, 11:26 AM  [employer]

## 2019-11-01 NOTE — Progress Notes (Signed)
Progress Note    Roger [name]  LMB:[id number] DOB: Jan 22, 1941  DOA: 10/31/2019 PCP: Lujean Amel, MD    Brief Narrative:     Medical records reviewed and are as summarized below:  Roger [name] is an 79 y.o. male with medical history significant of end-stage renal disease on hemodialysis Tuesdays Thursdays and Saturdays, hypertension, coronary artery disease, gout, diastolic dysfunction, prostate cancer, prior tobacco abuse and peripheral neuropathy who presented to the ER today with significant abdominal pain nausea vomiting.  Pain is rated as 10 out of 10 in the mid abdomen.  Radiating to his back.  Associated with the nausea vomiting and some episode of diarrhea.  Denied any fever or chills no hematemesis no melena no bright red blood per rectum.  Patient was back to his hemodialysis yesterday he is not having significant shortness of breath or cough.  No known Covid exposure.  Patient was evaluated in the ER and CT abdomen pelvis showed evidence of acute pancreatitis lipase is more than 10,000 has been admitted with acute idiopathic pancreatitis.  He denied alcohol abuse.  Assessment/Plan:   Principal Problem:   Acute pancreatitis Active Problems:   Peripheral vascular disease, unspecified (HCC)   CAD (coronary artery disease)   ESRD (end stage renal disease) ([place])   Essential hypertension   Severe acute pancreatitis:  -pain meds -bowel rest as NPO ultrasound of the right upper quadrant: general surgery consult: Cholelithiasis, gallbladder sludge and gallbladder wall thickening in the absence of a positive sonographic Murphy sign. -IV abx for now.  end-stage renal disease:  -On hemodialysis Tuesdays Thursdays Saturdays.   - dialysis Saturday -renal consult.  essential hypertension:  -Blood pressure elevated.   -Probably related to pain now and he is known renal disease.   - IV medications to control blood pressure.  peripheral vascular disease:  -holding PO  meds  H/o coronary artery disease:  -Stable.   Holding ASA/plavix   Family Communication/Anticipated D/C date and plan/Code Status   DVT prophylaxis: heparin Code Status: Full Code.  Disposition Plan: Status is: Inpatient  Remains inpatient appropriate because:IV treatments appropriate due to intensity of illness or inability to take PO   Dispo: The patient is from: Home              Anticipated d/c is to: Home              Anticipated d/c date is: > 3 days              Patient currently is not medically stable to d/c.         Medical Consultants:    General surgery    Subjective:   No pain currently Asking about eating  Objective:    Vitals:   10/31/19 2145 11/01/19 0456 11/01/19 0550 11/01/19 0902  BP: (!) 176/58  (!) 180/62 (!) 165/58  Pulse: (!) 57 (!) 52 61 (!) 57  Resp: 14 19 (!) 25 16  Temp:      TempSrc:      SpO2: 99% 100% 98% 98%   No intake or output data in the 24 hours ending 11/01/19 1010 There were no vitals filed for this visit.  Exam:  General: Appearance:     Overweight male in no acute distress     Lungs:      respirations unlabored  Heart:     Normal rhythm. No murmurs, rubs, or gallops.      Neurologic:   Awake, alert, oriented  x 3. No apparent focal neurological           defect.     Data Reviewed:   I have personally reviewed following labs and imaging studies:  Labs: Labs show the following:   Basic Metabolic Panel: Recent Labs  Lab 10/31/19 1253 11/01/19 0451  NA 141 140  K 4.6 3.9  CL 98 98  CO2 26 26  GLUCOSE 132* 96  BUN 27* 37*  CREATININE 6.53* 7.86*  CALCIUM 9.7 9.2   GFR CrCl cannot be calculated (Unknown ideal weight.). Liver Function Tests: Recent Labs  Lab 10/31/19 1253 11/01/19 0451  AST 257* 94*  ALT 151* 114*  ALKPHOS 112 97  BILITOT 1.8* 0.8  PROT 8.8* 8.0  ALBUMIN 4.5 4.0   Recent Labs  Lab 10/31/19 1253 11/01/19 0451  LIPASE >10,000* 1,724*   No results for input(s):  AMMONIA in the last 168 hours. Coagulation profile No results for input(s): INR, PROTIME in the last 168 hours.  CBC: Recent Labs  Lab 10/31/19 1253 11/01/19 0451  WBC 8.9 8.8  HGB 11.7* 11.2*  HCT 37.6* 35.1*  MCV 102.7* 100.9*  PLT 142* 146*   Cardiac Enzymes: No results for input(s): CKTOTAL, CKMB, CKMBINDEX, TROPONINI in the last 168 hours. BNP (last 3 results) No results for input(s): PROBNP in the last 8760 hours. CBG: No results for input(s): GLUCAP in the last 168 hours. D-Dimer: No results for input(s): DDIMER in the last 72 hours. Hgb A1c: No results for input(s): HGBA1C in the last 72 hours. Lipid Profile: No results for input(s): CHOL, HDL, LDLCALC, TRIG, CHOLHDL, LDLDIRECT in the last 72 hours. Thyroid function studies: No results for input(s): TSH, T4TOTAL, T3FREE, THYROIDAB in the last 72 hours.  Invalid input(s): FREET3 Anemia work up: No results for input(s): VITAMINB12, FOLATE, FERRITIN, TIBC, IRON, RETICCTPCT in the last 72 hours. Sepsis Labs: Recent Labs  Lab 10/31/19 1253 11/01/19 0451  WBC 8.9 8.8    Microbiology Recent Results (from the past 240 hour(s))  SARS Coronavirus 2 by RT PCR (hospital order, performed in [facility] hospital lab) Nasopharyngeal Nasopharyngeal Swab     Status: None   Collection Time: 10/31/19  7:25 PM   Specimen: Nasopharyngeal Swab  Result Value Ref Range Status   SARS Coronavirus 2 NEGATIVE NEGATIVE Final    Comment: (NOTE) SARS-CoV-2 target nucleic acids are NOT DETECTED.  The SARS-CoV-2 RNA is generally detectable in upper and lower respiratory specimens during the acute phase of infection. The lowest concentration of SARS-CoV-2 viral copies this assay can detect is 250 copies / mL. A negative result does not preclude SARS-CoV-2 infection and should not be used as the sole basis for treatment or other patient management decisions.  A negative result may occur with improper specimen collection / handling,  submission of specimen other than nasopharyngeal swab, presence of viral mutation(s) within the areas targeted by this assay, and inadequate number of viral copies (<250 copies / mL). A negative result must be combined with clinical observations, patient history, and epidemiological information.  Fact Sheet for Patients:   StrictlyIdeas.no  Fact Sheet for Healthcare Providers: BankingDealers.co.za  This test is not yet approved or  cleared by the [place] FDA and has been authorized for detection and/or diagnosis of SARS-CoV-2 by FDA under an Emergency Use Authorization (EUA).  This EUA will remain in effect (meaning this test can be used) for the duration of the COVID-19 declaration under Section 564(b)(1) of the Act, 21 U.S.C. section 360bbb-3(b)(1),  unless the authorization is terminated or revoked sooner.  Performed at [facility], [place] [address] [place], [place] [address]     Procedures and diagnostic studies:  CT ABDOMEN PELVIS WO CONTRAST  Result Date: 10/31/2019 CLINICAL DATA:  Nausea for 2 days and vomiting. Concern for pancreatitis. EXAM: CT ABDOMEN AND PELVIS WITHOUT CONTRAST TECHNIQUE: Multidetector CT imaging of the abdomen and pelvis was performed following the standard protocol without IV contrast. COMPARISON:  MR abdomen dated 06/02/2019 and CT abdomen pelvis dated 10/18/2016 FINDINGS: Lower chest: There is mild bilateral lower lobe atelectasis. Hepatobiliary: No focal liver abnormality is seen. There is a small amount of fat stranding surrounding the gallbladder. There is no definite gallbladder wall thickening. No biliary dilatation. Pancreas: There is a minimal amount of fat stranding near the inferior aspect of the pancreatic body (series 7, image 57). No pancreatic ductal dilatation. Spleen: Normal in size without focal abnormality. Adrenals/Urinary Tract: Adrenal glands are unremarkable. Kidneys are  atrophic, without renal calculi, focal lesion, or hydronephrosis. Bladder is unremarkable. Stomach/Bowel: Stomach is within normal limits. Appendix appears normal. There is colonic diverticulosis without evidence of diverticulitis. no evidence of bowel wall thickening, distention, or inflammatory changes. Vascular/Lymphatic: Aortic atherosclerosis. No enlarged abdominal or pelvic lymph nodes. Reproductive: The prostate is enlarged, measuring 5.5 cm in transverse dimension. Other: No abdominal wall hernia or abnormality. No abdominopelvic ascites. Musculoskeletal: No acute or significant osseous findings. IMPRESSION: 1. There is a small amount of fat stranding surrounding the gallbladder without definite gallbladder wall thickening. Findings are nonspecific but could represent acute cholecystitis. 2. Minimal amount of fat stranding near the pancreatic body is nonspecific but could represent early/mild acute pancreatitis. Aortic Atherosclerosis (ICD10-I70.0). Electronically Signed   By: Zerita Boers M.D.   On: 10/31/2019 19:12   DG Chest 1 View  Result Date: 10/31/2019 CLINICAL DATA:  Nausea. EXAM: CHEST  1 VIEW COMPARISON:  Chest radiograph 05/14/2018. FINDINGS: Monitoring leads overlie the patient. Dialysis catheter tip projects over the superior vena cava. Stable cardiac and mediastinal contours. Aortic atherosclerosis. Bibasilar heterogeneous opacities. No pleural effusion or pneumothorax. Thoracic spine degenerative changes. IMPRESSION: Bibasilar atelectasis. Electronically Signed   By: Lovey Newcomer M.D.   On: 10/31/2019 16:38   US Abdomen Limited RUQ  Result Date: 10/31/2019 CLINICAL DATA:  Pancreatitis. EXAM: ULTRASOUND ABDOMEN LIMITED RIGHT UPPER QUADRANT COMPARISON:  None. FINDINGS: Gallbladder: Echogenic shadowing gallstones and gallbladder sludge are seen within the gallbladder lumen. Gallbladder wall thickening is noted (7.4 mm). No sonographic Murphy sign noted by sonographer. Common bile duct:  Diameter: 6.2 mm Liver: No focal lesion identified. Within normal limits in parenchymal echogenicity. Portal vein is patent on color Doppler imaging with normal direction of blood flow towards the liver. Other: None. IMPRESSION: Cholelithiasis, gallbladder sludge and gallbladder wall thickening in the absence of a positive sonographic Murphy sign. Further correlation with a nuclear medicine hepatobiliary scan is recommended, as acute cholecystitis cannot completely be excluded. Electronically Signed   By: Virgina Norfolk M.D.   On: 10/31/2019 21:28    Medications:   . heparin  5,000 Units Subcutaneous Q8H   Continuous Infusions: . dextrose 5% lactated ringers 50 mL/hr at 11/01/19 0656     LOS: 1 day   Geradine Girt  [facility]   How to contact the [facility] Attending or Consulting provider [place] or covering provider during after hours [place], for this patient?  1. Check the care team in [facility] and look for a) attending/consulting TRH provider listed  and b) the [facility] team listed 2. Log into [email or web address] and use [place]'s universal password to access. If you do not have the password, please contact the hospital operator. 3. Locate the [facility] provider you are looking for under [facility] and page to a number that you can be directly reached. 4. If you still have difficulty reaching the provider, please page the [facility] (Director on Call) for the Hospitalists listed on amion for assistance.  11/01/2019, 10:10 AM

## 2019-11-01 NOTE — Telephone Encounter (Signed)
-----   Message from Rise Mu, PA-C sent at 11/01/2019  7:17 AM EDT ----- Echo showed normal pump function, normal wall motion, slightly stiffened heart, moderately leaky mitral valve, and a mildly leaky aortic valve. Compared to prior echo, the mitral valve is a little more leaky. Follow up as planned.

## 2019-11-02 LAB — COMPREHENSIVE METABOLIC PANEL
ALT: 69 U/L — ABNORMAL HIGH (ref 0–44)
AST: 43 U/L — ABNORMAL HIGH (ref 15–41)
Albumin: 3.8 g/dL (ref 3.5–5.0)
Alkaline Phosphatase: 84 U/L (ref 38–126)
Anion gap: 14 (ref 5–15)
BUN: 48 mg/dL — ABNORMAL HIGH (ref 8–23)
CO2: 25 mmol/L (ref 22–32)
Calcium: 9.2 mg/dL (ref 8.9–10.3)
Chloride: 100 mmol/L (ref 98–111)
Creatinine, Ser: 9.32 mg/dL — ABNORMAL HIGH (ref 0.61–1.24)
GFR calc Af Amer: 6 mL/min — ABNORMAL LOW (ref >60)
GFR calc non Af Amer: 5 mL/min — ABNORMAL LOW (ref >60)
Glucose, Bld: 78 mg/dL (ref 70–99)
Potassium: 5.5 mmol/L — ABNORMAL HIGH (ref 3.5–5.1)
Sodium: 139 mmol/L (ref 135–145)
Total Bilirubin: 0.9 mg/dL (ref 0.3–1.2)
Total Protein: 7.4 g/dL (ref 6.5–8.1)

## 2019-11-02 LAB — CBC
HCT: 33.2 % — ABNORMAL LOW (ref 39.0–52.0)
HCT: 32.8 % — ABNORMAL LOW (ref 39.0–52.0)
Hemoglobin: 10.3 g/dL — ABNORMAL LOW (ref 13.0–17.0)
Hemoglobin: 10.3 g/dL — ABNORMAL LOW (ref 13.0–17.0)
MCH: 31.4 pg (ref 26.0–34.0)
MCH: 32.2 pg (ref 26.0–34.0)
MCHC: 31 g/dL (ref 30.0–36.0)
MCHC: 31.4 g/dL (ref 30.0–36.0)
MCV: 101.2 fL — ABNORMAL HIGH (ref 80.0–100.0)
MCV: 102.5 fL — ABNORMAL HIGH (ref 80.0–100.0)
Platelets: 143 10*3/uL — ABNORMAL LOW (ref 150–400)
Platelets: 155 10*3/uL (ref 150–400)
RBC: 3.28 MIL/uL — ABNORMAL LOW (ref 4.22–5.81)
RBC: 3.2 MIL/uL — ABNORMAL LOW (ref 4.22–5.81)
RDW: 16.7 % — ABNORMAL HIGH (ref 11.5–15.5)
RDW: 16.6 % — ABNORMAL HIGH (ref 11.5–15.5)
WBC: 8.6 10*3/uL (ref 4.0–10.5)
WBC: 9.5 10*3/uL (ref 4.0–10.5)
nRBC: 0 % (ref 0.0–0.2)
nRBC: 0 % (ref 0.0–0.2)

## 2019-11-02 LAB — GLUCOSE, CAPILLARY
Glucose-Capillary: 42 mg/dL — CL (ref 70–99)
Glucose-Capillary: 85 mg/dL (ref 70–99)

## 2019-11-02 LAB — MRSA PCR SCREENING: MRSA by PCR: NEGATIVE

## 2019-11-02 LAB — LIPASE, BLOOD: Lipase: 307 U/L — ABNORMAL HIGH (ref 11–51)

## 2019-11-02 LAB — PHOSPHORUS: Phosphorus: 5.8 mg/dL — ABNORMAL HIGH (ref 2.5–4.6)

## 2019-11-02 MED ORDER — SIMETHICONE 80 MG PO CHEW
80.0000 mg | CHEWABLE_TABLET | Freq: Four times a day (QID) | ORAL | Status: DC | PRN
  Administered 2019-11-02: 80 mg via ORAL
  Filled 2019-11-02: qty 1

## 2019-11-02 MED ORDER — SODIUM CHLORIDE 0.9 % IV SOLN: 100.0000 mL | INTRAVENOUS | Status: DC | PRN

## 2019-11-02 MED ORDER — TAMSULOSIN HCL 0.4 MG PO CAPS
0.4000 mg | ORAL_CAPSULE | Freq: Every day | ORAL | Status: DC
  Administered 2019-11-02 – 2019-11-05 (×3): 0.4 mg via ORAL
  Filled 2019-11-02 (×4): qty 1

## 2019-11-02 MED ORDER — DEXTROSE 50 % IV SOLN
INTRAVENOUS | Status: AC
  Administered 2019-11-02: 50 mL
  Filled 2019-11-02: qty 50

## 2019-11-02 MED ORDER — HEPARIN SODIUM (PORCINE) 1000 UNIT/ML IJ SOLN
INTRAMUSCULAR | Status: AC
  Filled 2019-11-02: qty 4

## 2019-11-02 MED ORDER — ALTEPLASE 2 MG IJ SOLR: 2.0000 mg | Freq: Once | INTRAMUSCULAR | Status: DC | PRN

## 2019-11-02 MED ORDER — HEPARIN SODIUM (PORCINE) 1000 UNIT/ML IJ SOLN
INTRAMUSCULAR | Status: AC
  Filled 2019-11-02: qty 2

## 2019-11-02 MED ORDER — METOPROLOL SUCCINATE ER 25 MG PO TB24
12.5000 mg | ORAL_TABLET | Freq: Every day | ORAL | Status: DC
  Administered 2019-11-02 – 2019-11-05 (×3): 12.5 mg via ORAL
  Filled 2019-11-02 (×4): qty 1

## 2019-11-02 MED ORDER — DEXTROSE IN LACTATED RINGERS 5 % IV SOLN: INTRAVENOUS | Status: DC

## 2019-11-02 MED ORDER — METRONIDAZOLE IN NACL 5-0.79 MG/ML-% IV SOLN
500.0000 mg | Freq: Three times a day (TID) | INTRAVENOUS | Status: DC
  Administered 2019-11-02 – 2019-11-04 (×5): 500 mg via INTRAVENOUS
  Filled 2019-11-02 (×5): qty 100

## 2019-11-02 MED ORDER — MIDODRINE HCL 5 MG PO TABS
10.0000 mg | ORAL_TABLET | Freq: Every evening | ORAL | Status: DC
  Administered 2019-11-02 – 2019-11-04 (×3): 10 mg via ORAL
  Filled 2019-11-02 (×3): qty 2

## 2019-11-02 NOTE — Progress Notes (Signed)
Progress Note    Roger [name]  WGY:[id number] DOB: [birth year]  DOA: 10/31/2019 PCP: Lujean Amel, MD    Brief Narrative:     Medical records reviewed and are as summarized below:  Roger [name] is an 79 y.o. male with medical history significant of end-stage renal disease on hemodialysis Tuesdays Thursdays and Saturdays, hypertension, coronary artery disease, gout, diastolic dysfunction, prostate cancer, prior tobacco abuse and peripheral neuropathy who presented to the ER today with significant abdominal pain nausea vomiting.  found to have gallstone pancreatitis.  Plan for surgery in AM.  Assessment/Plan:   Principal Problem:   Acute pancreatitis Active Problems:   Peripheral vascular disease, unspecified (HCC)   CAD (coronary artery disease)   ESRD (end stage renal disease) ([place])   Essential hypertension   Pancreatitis   Severe acute pancreatitis due to gall stones  -pain meds prn -clear with NPO after midnight ultrasound of the right upper quadrant:Cholelithiasis, gallbladder sludge and gallbladder wall thickening in the absence of a positive sonographic Murphy sign. -IV abx for now. -general surgery consult appreciated and plan for surgery in the AM  Chronic diastolic CHF -volume control with HD  end-stage renal disease:  -On hemodialysis Tuesdays Thursdays Saturdays.   -renal consult appreciated  essential hypertension:  -hypotensive with HD -monitor closely  peripheral vascular disease:  -holding PO meds  H/o coronary artery disease:  -Stable.   Holding ASA/plavix   Family Communication/Anticipated D/C date and plan/Code Status   DVT prophylaxis: heparin Code Status: Full Code.  Disposition Plan: Status is: Inpatient  Remains inpatient appropriate because:IV treatments appropriate due to intensity of illness or inability to take PO   Dispo: The patient is from: Home              Anticipated d/c is to: Home              Anticipated d/c  date is: 2-3 days              Patient currently is not medically stable to d/c.         Medical Consultants:    General surgery  nephrology    Subjective:   Just back from HD, tired  Objective:    Vitals:   11/02/19 0900 11/02/19 0930 11/02/19 1000 11/02/19 1030  BP: (!) 118/59 112/68 122/68 (!) 92/45  Pulse: 74 69 68 70  Resp:   19 (!) 25  Temp:      TempSrc:      SpO2:      Weight:        Intake/Output Summary (Last 24 hours) at 11/02/2019 1044 Last data filed at 11/02/2019 0600 Gross per 24 hour  Intake 79.01 ml  Output 125 ml  Net -45.99 ml   Filed Weights   11/01/19 1940  Weight: 77.9 kg    Exam:  General: Appearance:     Overweight male in no acute distress     Lungs:      respirations unlabored  Heart:    Normal heart rate. Normal rhythm. No murmurs, rubs, or gallops.   MS:   All extremities are intact.   Neurologic:   sleepy                       Data Reviewed:   I have personally reviewed following labs and imaging studies:  Labs: Labs show the following:   Basic Metabolic Panel: Recent Labs  Lab 10/31/19 1253 10/31/19 1253  11/01/19 0451 11/02/19 0630  NA 141  --  140 139  K 4.6   < > 3.9 5.5*  CL 98  --  98 100  CO2 26  --  26 25  GLUCOSE 132*  --  96 78  BUN 27*  --  37* 48*  CREATININE 6.53*  --  7.86* 9.32*  CALCIUM 9.7  --  9.2 9.2  PHOS  --   --   --  5.8*   < > = values in this interval not displayed.   GFR Estimated Creatinine Clearance: 6.6 mL/min (A) (by C-G formula based on SCr of 9.32 mg/dL (H)). Liver Function Tests: Recent Labs  Lab 10/31/19 1253 11/01/19 0451 11/02/19 0630  AST 257* 94* 43*  ALT 151* 114* 69*  ALKPHOS 112 97 84  BILITOT 1.8* 0.8 0.9  PROT 8.8* 8.0 7.4  ALBUMIN 4.5 4.0 3.8   Recent Labs  Lab 10/31/19 1253 11/01/19 0451 11/02/19 0630  LIPASE >10,000* 1,724* 307*   No results for input(s): AMMONIA in the last 168 hours. Coagulation profile No results for input(s):  INR, PROTIME in the last 168 hours.  CBC: Recent Labs  Lab 10/31/19 1253 11/01/19 0451 11/02/19 0630 11/02/19 0730  WBC 8.9 8.8 8.6 9.5  HGB 11.7* 11.2* 10.3* 10.3*  HCT 37.6* 35.1* 33.2* 32.8*  MCV 102.7* 100.9* 101.2* 102.5*  PLT 142* 146* 143* 155   Cardiac Enzymes: No results for input(s): CKTOTAL, CKMB, CKMBINDEX, TROPONINI in the last 168 hours. BNP (last 3 results) No results for input(s): PROBNP in the last 8760 hours. CBG: No results for input(s): GLUCAP in the last 168 hours. D-Dimer: No results for input(s): DDIMER in the last 72 hours. Hgb A1c: No results for input(s): HGBA1C in the last 72 hours. Lipid Profile: No results for input(s): CHOL, HDL, LDLCALC, TRIG, CHOLHDL, LDLDIRECT in the last 72 hours. Thyroid function studies: No results for input(s): TSH, T4TOTAL, T3FREE, THYROIDAB in the last 72 hours.  Invalid input(s): FREET3 Anemia work up: No results for input(s): VITAMINB12, FOLATE, FERRITIN, TIBC, IRON, RETICCTPCT in the last 72 hours. Sepsis Labs: Recent Labs  Lab 10/31/19 1253 11/01/19 0451 11/02/19 0630 11/02/19 0730  WBC 8.9 8.8 8.6 9.5    Microbiology Recent Results (from the past 240 hour(s))  SARS Coronavirus 2 by RT PCR (hospital order, performed in [facility] hospital lab) Nasopharyngeal Nasopharyngeal Swab     Status: None   Collection Time: 10/31/19  7:25 PM   Specimen: Nasopharyngeal Swab  Result Value Ref Range Status   SARS Coronavirus 2 NEGATIVE NEGATIVE Final    Comment: (NOTE) SARS-CoV-2 target nucleic acids are NOT DETECTED.  The SARS-CoV-2 RNA is generally detectable in upper and lower respiratory specimens during the acute phase of infection. The lowest concentration of SARS-CoV-2 viral copies this assay can detect is 250 copies / mL. A negative result does not preclude SARS-CoV-2 infection and should not be used as the sole basis for treatment or other patient management decisions.  A negative result may occur  with improper specimen collection / handling, submission of specimen other than nasopharyngeal swab, presence of viral mutation(s) within the areas targeted by this assay, and inadequate number of viral copies (<250 copies / mL). A negative result must be combined with clinical observations, patient history, and epidemiological information.  Fact Sheet for Patients:   StrictlyIdeas.no  Fact Sheet for Healthcare Providers: BankingDealers.co.za  This test is not yet approved or  cleared by the [place] FDA and  has been authorized for detection and/or diagnosis of SARS-CoV-2 by FDA under an Emergency Use Authorization (EUA).  This EUA will remain in effect (meaning this test can be used) for the duration of the COVID-19 declaration under Section 564(b)(1) of the Act, 21 U.S.C. section 360bbb-3(b)(1), unless the authorization is terminated or revoked sooner.  Performed at [facility], [place] [address] [place], [place] [address]   MRSA PCR Screening     Status: None   Collection Time: 11/01/19  9:04 PM   Specimen: Nasal Mucosa; Nasopharyngeal  Result Value Ref Range Status   MRSA by PCR NEGATIVE NEGATIVE Final    Comment:        The GeneXpert MRSA Assay (FDA approved for NASAL specimens only), is one component of a comprehensive MRSA colonization surveillance program. It is not intended to diagnose MRSA infection nor to guide or monitor treatment for MRSA infections. Performed at [facility], [place] [address], [place], [place] [address]     Procedures and diagnostic studies:  CT ABDOMEN PELVIS WO CONTRAST  Result Date: 10/31/2019 CLINICAL DATA:  Nausea for 2 days and vomiting. Concern for pancreatitis. EXAM: CT ABDOMEN AND PELVIS WITHOUT CONTRAST TECHNIQUE: Multidetector CT imaging of the abdomen and pelvis was performed following the standard protocol without IV contrast. COMPARISON:  MR abdomen dated 06/02/2019  and CT abdomen pelvis dated 10/18/2016 FINDINGS: Lower chest: There is mild bilateral lower lobe atelectasis. Hepatobiliary: No focal liver abnormality is seen. There is a small amount of fat stranding surrounding the gallbladder. There is no definite gallbladder wall thickening. No biliary dilatation. Pancreas: There is a minimal amount of fat stranding near the inferior aspect of the pancreatic body (series 7, image 57). No pancreatic ductal dilatation. Spleen: Normal in size without focal abnormality. Adrenals/Urinary Tract: Adrenal glands are unremarkable. Kidneys are atrophic, without renal calculi, focal lesion, or hydronephrosis. Bladder is unremarkable. Stomach/Bowel: Stomach is within normal limits. Appendix appears normal. There is colonic diverticulosis without evidence of diverticulitis. no evidence of bowel wall thickening, distention, or inflammatory changes. Vascular/Lymphatic: Aortic atherosclerosis. No enlarged abdominal or pelvic lymph nodes. Reproductive: The prostate is enlarged, measuring 5.5 cm in transverse dimension. Other: No abdominal wall hernia or abnormality. No abdominopelvic ascites. Musculoskeletal: No acute or significant osseous findings. IMPRESSION: 1. There is a small amount of fat stranding surrounding the gallbladder without definite gallbladder wall thickening. Findings are nonspecific but could represent acute cholecystitis. 2. Minimal amount of fat stranding near the pancreatic body is nonspecific but could represent early/mild acute pancreatitis. Aortic Atherosclerosis (ICD10-I70.0). Electronically Signed   By: Zerita Boers M.D.   On: 10/31/2019 19:12   DG Chest 1 View  Result Date: 10/31/2019 CLINICAL DATA:  Nausea. EXAM: CHEST  1 VIEW COMPARISON:  Chest radiograph 05/14/2018. FINDINGS: Monitoring leads overlie the patient. Dialysis catheter tip projects over the superior vena cava. Stable cardiac and mediastinal contours. Aortic atherosclerosis. Bibasilar  heterogeneous opacities. No pleural effusion or pneumothorax. Thoracic spine degenerative changes. IMPRESSION: Bibasilar atelectasis. Electronically Signed   By: Lovey Newcomer M.D.   On: 10/31/2019 16:38   US Abdomen Limited RUQ  Result Date: 10/31/2019 CLINICAL DATA:  Pancreatitis. EXAM: ULTRASOUND ABDOMEN LIMITED RIGHT UPPER QUADRANT COMPARISON:  None. FINDINGS: Gallbladder: Echogenic shadowing gallstones and gallbladder sludge are seen within the gallbladder lumen. Gallbladder wall thickening is noted (7.4 mm). No sonographic Murphy sign noted by sonographer. Common bile duct: Diameter: 6.2 mm Liver: No focal lesion identified. Within normal limits in parenchymal echogenicity. Portal vein is  patent on color Doppler imaging with normal direction of blood flow towards the liver. Other: None. IMPRESSION: Cholelithiasis, gallbladder sludge and gallbladder wall thickening in the absence of a positive sonographic Murphy sign. Further correlation with a nuclear medicine hepatobiliary scan is recommended, as acute cholecystitis cannot completely be excluded. Electronically Signed   By: Virgina Norfolk M.D.   On: 10/31/2019 21:28    Medications:    Chlorhexidine Gluconate Cloth  6 each Topical Q0600   heparin  5,000 Units Subcutaneous Q8H   Continuous Infusions:  sodium chloride     sodium chloride     cefTRIAXone (ROCEPHIN)  IV Stopped (11/01/19 1147)     LOS: 2 days   Geradine Girt  [facility]   How to contact the [facility] Attending or Consulting provider [place] or covering provider during after hours [place], for this patient?  1. Check the care team in [facility] and look for a) attending/consulting TRH provider listed and b) the [facility] team listed 2. Log into [email or web address] and use [place]'s universal password to access. If you do not have the password, please contact the hospital operator. 3. Locate the [facility] provider you are looking for under [facility] and page to a number that  you can be directly reached. 4. If you still have difficulty reaching the provider, please page the [facility] (Director on Call) for the Hospitalists listed on amion for assistance.  11/02/2019, 10:44 AM

## 2019-11-02 NOTE — Procedures (Signed)
I was present at this dialysis session. I have reviewed the session itself and made appropriate changes.   Filed Weights   11/01/19 1940 11/02/19 1057  Weight: 77.9 kg 76.4 kg    Recent Labs  Lab 11/02/19 0630  NA 139  K 5.5*  CL 100  CO2 25  GLUCOSE 78  BUN 48*  CREATININE 9.32*  CALCIUM 9.2  PHOS 5.8*    Recent Labs  Lab 11/01/19 0451 11/02/19 0630 11/02/19 0730  WBC 8.8 8.6 9.5  HGB 11.2* 10.3* 10.3*  HCT 35.1* 33.2* 32.8*  MCV 100.9* 101.2* 102.5*  PLT 146* 143* 155    Scheduled Meds: . Chlorhexidine Gluconate Cloth  6 each Topical Q0600  . heparin  5,000 Units Subcutaneous Q8H  . heparin sodium (porcine)      . heparin sodium (porcine)       Continuous Infusions: . sodium chloride    . sodium chloride    . cefTRIAXone (ROCEPHIN)  IV Stopped (11/01/19 1147)   PRN Meds:.sodium chloride, sodium chloride, alteplase, hydrALAZINE, HYDROmorphone (DILAUDID) injection, iohexol, ondansetron **OR** ondansetron (ZOFRAN) IV, simethicone   [name], MD [facility] 11/02/2019, 11:24 AM

## 2019-11-02 NOTE — Progress Notes (Signed)
[facility] Progress Note     Subjective: CC-  Seen in dialysis. Feeling better than yesterday. Abdominal pain improved but not fully resolved. He only took 1 dose of dilaudid last night. Denies n/v but he is burping. Lipase down to 307.  Objective: Vital signs in last 24 hours: Temp:  [98 F (36.7 C)] 98 F (36.7 C) (08/17 0143) Pulse Rate:  [56-79] 59 (08/17 0730) Resp:  [13-23] 16 (08/17 0700) BP: (115-167)/(56-95) 115/63 (08/17 0730) SpO2:  [97 %-100 %] 98 % (08/17 0650) Weight:  [77.9 kg] 77.9 kg (08/16 1940) Last BM Date: 10/31/19  Intake/Output from previous day: 08/16 0701 - 08/17 0700 In: 79 [IV Piggyback:79] Out: 125 [Urine:125] Intake/Output this shift: No intake/output data recorded.  PE: Gen:  Alert, NAD, pleasant Pulm:  rate and effort normal Abd: Soft, mild distension, +BS, no HSM, mild epigastric TTP  Skin: no rashes noted, warm and dry  Lab Results:  Recent Labs    11/02/19 0630 11/02/19 0730  WBC 8.6 9.5  HGB 10.3* 10.3*  HCT 33.2* 32.8*  PLT 143* 155   BMET Recent Labs    11/01/19 0451 11/02/19 0630  NA 140 139  K 3.9 5.5*  CL 98 100  CO2 26 25  GLUCOSE 96 78  BUN 37* 48*  CREATININE 7.86* 9.32*  CALCIUM 9.2 9.2   PT/INR No results for input(s): LABPROT, INR in the last 72 hours. CMP     Component Value Date/Time   NA 139 11/02/2019 0630   NA 143 11/26/2016 0925   K 5.5 (H) 11/02/2019 0630   CL 100 11/02/2019 0630   CO2 25 11/02/2019 0630   GLUCOSE 78 11/02/2019 0630   BUN 48 (H) 11/02/2019 0630   BUN 27 11/26/2016 0925   CREATININE 9.32 (H) 11/02/2019 0630   CALCIUM 9.2 11/02/2019 0630   CALCIUM 9.1 07/29/2012 1000   PROT 7.4 11/02/2019 0630   PROT 7.4 03/05/2019 1526   ALBUMIN 3.8 11/02/2019 0630   ALBUMIN 4.4 03/05/2019 1526   AST 43 (H) 11/02/2019 0630   ALT 69 (H) 11/02/2019 0630   ALKPHOS 84 11/02/2019 0630   BILITOT 0.9 11/02/2019 0630   BILITOT 0.3 03/05/2019 1526   GFRNONAA 5 (L) 11/02/2019  0630   GFRAA 6 (L) 11/02/2019 0630   Lipase     Component Value Date/Time   LIPASE 307 (H) 11/02/2019 0630       Studies/Results: CT ABDOMEN PELVIS WO CONTRAST  Result Date: 10/31/2019 CLINICAL DATA:  Nausea for 2 days and vomiting. Concern for pancreatitis. EXAM: CT ABDOMEN AND PELVIS WITHOUT CONTRAST TECHNIQUE: Multidetector CT imaging of the abdomen and pelvis was performed following the standard protocol without IV contrast. COMPARISON:  MR abdomen dated 06/02/2019 and CT abdomen pelvis dated 10/18/2016 FINDINGS: Lower chest: There is mild bilateral lower lobe atelectasis. Hepatobiliary: No focal liver abnormality is seen. There is a small amount of fat stranding surrounding the gallbladder. There is no definite gallbladder wall thickening. No biliary dilatation. Pancreas: There is a minimal amount of fat stranding near the inferior aspect of the pancreatic body (series 7, image 57). No pancreatic ductal dilatation. Spleen: Normal in size without focal abnormality. Adrenals/Urinary Tract: Adrenal glands are unremarkable. Kidneys are atrophic, without renal calculi, focal lesion, or hydronephrosis. Bladder is unremarkable. Stomach/Bowel: Stomach is within normal limits. Appendix appears normal. There is colonic diverticulosis without evidence of diverticulitis. no evidence of bowel wall thickening, distention, or inflammatory changes. Vascular/Lymphatic: Aortic atherosclerosis. No enlarged abdominal or pelvic  lymph nodes. Reproductive: The prostate is enlarged, measuring 5.5 cm in transverse dimension. Other: No abdominal wall hernia or abnormality. No abdominopelvic ascites. Musculoskeletal: No acute or significant osseous findings. IMPRESSION: 1. There is a small amount of fat stranding surrounding the gallbladder without definite gallbladder wall thickening. Findings are nonspecific but could represent acute cholecystitis. 2. Minimal amount of fat stranding near the pancreatic body is  nonspecific but could represent early/mild acute pancreatitis. Aortic Atherosclerosis (ICD10-I70.0). Electronically Signed   By: Zerita Boers M.D.   On: 10/31/2019 19:12   DG Chest 1 View  Result Date: 10/31/2019 CLINICAL DATA:  Nausea. EXAM: CHEST  1 VIEW COMPARISON:  Chest radiograph 05/14/2018. FINDINGS: Monitoring leads overlie the patient. Dialysis catheter tip projects over the superior vena cava. Stable cardiac and mediastinal contours. Aortic atherosclerosis. Bibasilar heterogeneous opacities. No pleural effusion or pneumothorax. Thoracic spine degenerative changes. IMPRESSION: Bibasilar atelectasis. Electronically Signed   By: Lovey Newcomer M.D.   On: 10/31/2019 16:38   US Abdomen Limited RUQ  Result Date: 10/31/2019 CLINICAL DATA:  Pancreatitis. EXAM: ULTRASOUND ABDOMEN LIMITED RIGHT UPPER QUADRANT COMPARISON:  None. FINDINGS: Gallbladder: Echogenic shadowing gallstones and gallbladder sludge are seen within the gallbladder lumen. Gallbladder wall thickening is noted (7.4 mm). No sonographic Murphy sign noted by sonographer. Common bile duct: Diameter: 6.2 mm Liver: No focal lesion identified. Within normal limits in parenchymal echogenicity. Portal vein is patent on color Doppler imaging with normal direction of blood flow towards the liver. Other: None. IMPRESSION: Cholelithiasis, gallbladder sludge and gallbladder wall thickening in the absence of a positive sonographic Murphy sign. Further correlation with a nuclear medicine hepatobiliary scan is recommended, as acute cholecystitis cannot completely be excluded. Electronically Signed   By: Virgina Norfolk M.D.   On: 10/31/2019 21:28    Anti-infectives: Anti-infectives (From admission, onward)   Start     Dose/Rate Route Frequency Ordered Stop   11/01/19 1030  cefTRIAXone (ROCEPHIN) 2 g in sodium chloride 0.9 % 100 mL IVPB     Discontinue     2 g 200 mL/hr over 30 Minutes Intravenous Every 24 hours 11/01/19 1026          Assessment/Plan ESRD on HD CAD, prior MI - recent Echo is stable.  Hold plavix (last dose 8/14) Prostate cancer PAF HTN Peripheral neuropathy  Gallstone pancreatitis - Abdominal exam improving, only mild epigastric TTP. LFTs down and lipase down to 307. Plan laparoscopic cholecystectomy once pancreatitis resolves, I suspect he may be ready for surgery tomorrow. [place] for clear liquids today, NPO after midnight. Repeat labs in AM.  FEN - CLD, IVF, NPO after midnight VTE - sq heparin, hold plavix ID - Rocephin 8/16>>   LOS: 2 days    Wellington Hampshire, [facility] 11/02/2019, 8:54 AM Please see Amion for pager number during day hours 7:00am-4:30pm

## 2019-11-02 NOTE — Progress Notes (Signed)
[facility] Progress Note    Assessment/ Plan:   1.  Acute Pancreatitis: Gallstone seems to be likely trigger. LFTs and Lipase improving s/p IVF + Ceftraixone. No EtOH nor diabetes. Stones in GB on [place]. Surgery consulted - plan for lap chole tomorrow. 2.  ESRD: Continue HD per TTS schedule. Hd today, no heparin 3.  Hypertension/volume: uf as tolerated 4.  Anemia of ESRD: Hgb 11.2 - not due for ESA yet. 5.  Metabolic bone disease: Ca ok, Phos pending.  6.  PVD 7.  CAD: Plavix on hold.  Dialysis Orders:  TTS @ [facility] -> full HD 8/14 4hr, 400/A1.5, EDW 80.5kg, 3K/2Ca, UFP #4, TDC, heparin 4000 bolus - Mircera 63mcg IV q 4 wk (last 8/5) - Calcitriol 2.63mcg PO q HD  [name], MD [place] 11/02/2019, 11:24 AM   Subjective:   Seen on dialysis. No n/v, still has some abdominal pain. Still having flatus. uf goal 1L   Objective:   BP (!) 123/50 (BP Location: Right Arm)   Pulse 64   Temp 98.8 F (37.1 C) (Oral)   Resp (!) 25   Wt 76.4 kg   SpO2 98%   BMI 27.19 kg/m   Intake/Output Summary (Last 24 hours) at 11/02/2019 1124 Last data filed at 11/02/2019 1057 Gross per 24 hour  Intake 79.01 ml  Output 975 ml  Net -895.99 ml   Weight change:   Physical Exam: Gen:nad, laying flat in bed CVS:s1s2, rrr Resp:cta bl, unlabored, bl chest expansion NUU:VOZDGU to palpation Ext:no edema Access: RIJ TDC c/d/i  Imaging: CT ABDOMEN PELVIS WO CONTRAST  Result Date: 10/31/2019 CLINICAL DATA:  Nausea for 2 days and vomiting. Concern for pancreatitis. EXAM: CT ABDOMEN AND PELVIS WITHOUT CONTRAST TECHNIQUE: Multidetector CT imaging of the abdomen and pelvis was performed following the standard protocol without IV contrast. COMPARISON:  MR abdomen dated 06/02/2019 and CT abdomen pelvis dated 10/18/2016 FINDINGS: Lower chest: There is mild bilateral lower lobe atelectasis. Hepatobiliary: No focal liver abnormality is seen. There is a small amount  of fat stranding surrounding the gallbladder. There is no definite gallbladder wall thickening. No biliary dilatation. Pancreas: There is a minimal amount of fat stranding near the inferior aspect of the pancreatic body (series 7, image 57). No pancreatic ductal dilatation. Spleen: Normal in size without focal abnormality. Adrenals/Urinary Tract: Adrenal glands are unremarkable. Kidneys are atrophic, without renal calculi, focal lesion, or hydronephrosis. Bladder is unremarkable. Stomach/Bowel: Stomach is within normal limits. Appendix appears normal. There is colonic diverticulosis without evidence of diverticulitis. no evidence of bowel wall thickening, distention, or inflammatory changes. Vascular/Lymphatic: Aortic atherosclerosis. No enlarged abdominal or pelvic lymph nodes. Reproductive: The prostate is enlarged, measuring 5.5 cm in transverse dimension. Other: No abdominal wall hernia or abnormality. No abdominopelvic ascites. Musculoskeletal: No acute or significant osseous findings. IMPRESSION: 1. There is a small amount of fat stranding surrounding the gallbladder without definite gallbladder wall thickening. Findings are nonspecific but could represent acute cholecystitis. 2. Minimal amount of fat stranding near the pancreatic body is nonspecific but could represent early/mild acute pancreatitis. Aortic Atherosclerosis (ICD10-I70.0). Electronically Signed   By: Zerita Boers M.D.   On: 10/31/2019 19:12   DG Chest 1 View  Result Date: 10/31/2019 CLINICAL DATA:  Nausea. EXAM: CHEST  1 VIEW COMPARISON:  Chest radiograph 05/14/2018. FINDINGS: Monitoring leads overlie the patient. Dialysis catheter tip projects over the superior vena cava. Stable cardiac and mediastinal contours. Aortic atherosclerosis. Bibasilar heterogeneous opacities. No pleural effusion  or pneumothorax. Thoracic spine degenerative changes. IMPRESSION: Bibasilar atelectasis. Electronically Signed   By: Lovey Newcomer M.D.   On: 10/31/2019  16:38   US Abdomen Limited RUQ  Result Date: 10/31/2019 CLINICAL DATA:  Pancreatitis. EXAM: ULTRASOUND ABDOMEN LIMITED RIGHT UPPER QUADRANT COMPARISON:  None. FINDINGS: Gallbladder: Echogenic shadowing gallstones and gallbladder sludge are seen within the gallbladder lumen. Gallbladder wall thickening is noted (7.4 mm). No sonographic Murphy sign noted by sonographer. Common bile duct: Diameter: 6.2 mm Liver: No focal lesion identified. Within normal limits in parenchymal echogenicity. Portal vein is patent on color Doppler imaging with normal direction of blood flow towards the liver. Other: None. IMPRESSION: Cholelithiasis, gallbladder sludge and gallbladder wall thickening in the absence of a positive sonographic Murphy sign. Further correlation with a nuclear medicine hepatobiliary scan is recommended, as acute cholecystitis cannot completely be excluded. Electronically Signed   By: Virgina Norfolk M.D.   On: 10/31/2019 21:28    Labs: BMET Recent Labs  Lab 10/31/19 1253 11/01/19 0451 11/02/19 0630  NA 141 140 139  K 4.6 3.9 5.5*  CL 98 98 100  CO2 26 26 25   GLUCOSE 132* 96 78  BUN 27* 37* 48*  CREATININE 6.53* 7.86* 9.32*  CALCIUM 9.7 9.2 9.2  PHOS  --   --  5.8*   CBC Recent Labs  Lab 10/31/19 1253 11/01/19 0451 11/02/19 0630 11/02/19 0730  WBC 8.9 8.8 8.6 9.5  HGB 11.7* 11.2* 10.3* 10.3*  HCT 37.6* 35.1* 33.2* 32.8*  MCV 102.7* 100.9* 101.2* 102.5*  PLT 142* 146* 143* 155    Medications:    . Chlorhexidine Gluconate Cloth  6 each Topical Q0600  . heparin  5,000 Units Subcutaneous Q8H  . heparin sodium (porcine)      . heparin sodium (porcine)

## 2019-11-03 ENCOUNTER — Inpatient Hospital Stay (HOSPITAL_COMMUNITY): Payer: Medicare Other | Admitting: Anesthesiology

## 2019-11-03 ENCOUNTER — Encounter (HOSPITAL_COMMUNITY): Payer: Self-pay | Admitting: Internal Medicine

## 2019-11-03 ENCOUNTER — Encounter (HOSPITAL_COMMUNITY)
Admission: EM | Disposition: A | Discharge status: Home / Self Care | Payer: Self-pay | Source: Home / Self Care | Attending: Internal Medicine

## 2019-11-03 DIAGNOSIS — K8063 Calculus of gallbladder and bile duct with acute cholecystitis with obstruction: Secondary | ICD-10-CM

## 2019-11-03 HISTORY — PX: CHOLECYSTECTOMY: SHX55

## 2019-11-03 HISTORY — PX: INTRAOPERATIVE CHOLANGIOGRAM: SHX5230

## 2019-11-03 LAB — CBC
HCT: 34.1 % — ABNORMAL LOW (ref 39.0–52.0)
Hemoglobin: 10.7 g/dL — ABNORMAL LOW (ref 13.0–17.0)
MCH: 32.2 pg (ref 26.0–34.0)
MCHC: 31.4 g/dL (ref 30.0–36.0)
MCV: 102.7 fL — ABNORMAL HIGH (ref 80.0–100.0)
Platelets: 119 10*3/uL — ABNORMAL LOW (ref 150–400)
RBC: 3.32 MIL/uL — ABNORMAL LOW (ref 4.22–5.81)
RDW: 16.3 % — ABNORMAL HIGH (ref 11.5–15.5)
WBC: 9.3 10*3/uL (ref 4.0–10.5)
nRBC: 0 % (ref 0.0–0.2)

## 2019-11-03 LAB — COMPREHENSIVE METABOLIC PANEL
ALT: 51 U/L — ABNORMAL HIGH (ref 0–44)
AST: 50 U/L — ABNORMAL HIGH (ref 15–41)
Albumin: 3.3 g/dL — ABNORMAL LOW (ref 3.5–5.0)
Alkaline Phosphatase: 76 U/L (ref 38–126)
Anion gap: 12 (ref 5–15)
BUN: 30 mg/dL — ABNORMAL HIGH (ref 8–23)
CO2: 23 mmol/L (ref 22–32)
Calcium: 8.5 mg/dL — ABNORMAL LOW (ref 8.9–10.3)
Chloride: 98 mmol/L (ref 98–111)
Creatinine, Ser: 7.14 mg/dL — ABNORMAL HIGH (ref 0.61–1.24)
GFR calc Af Amer: 8 mL/min — ABNORMAL LOW (ref >60)
GFR calc non Af Amer: 7 mL/min — ABNORMAL LOW (ref >60)
Glucose, Bld: 200 mg/dL — ABNORMAL HIGH (ref 70–99)
Potassium: 5.3 mmol/L — ABNORMAL HIGH (ref 3.5–5.1)
Sodium: 133 mmol/L — ABNORMAL LOW (ref 135–145)
Total Bilirubin: 0.8 mg/dL (ref 0.3–1.2)
Total Protein: 7.3 g/dL (ref 6.5–8.1)

## 2019-11-03 LAB — LIPASE, BLOOD: Lipase: 135 U/L — ABNORMAL HIGH (ref 11–51)

## 2019-11-03 LAB — SURGICAL PCR SCREEN
MRSA, PCR: NEGATIVE
Staphylococcus aureus: NEGATIVE

## 2019-11-03 LAB — GLUCOSE, CAPILLARY: Glucose-Capillary: 95 mg/dL (ref 70–99)

## 2019-11-03 SURGERY — LAPAROSCOPIC CHOLECYSTECTOMY WITH INTRAOPERATIVE CHOLANGIOGRAM
Anesthesia: General | Site: Abdomen

## 2019-11-03 MED ORDER — ROCURONIUM BROMIDE 10 MG/ML (PF) SYRINGE
PREFILLED_SYRINGE | INTRAVENOUS | Status: AC
  Filled 2019-11-03: qty 10

## 2019-11-03 MED ORDER — PHENYLEPHRINE 40 MCG/ML (10ML) SYRINGE FOR IV PUSH (FOR BLOOD PRESSURE SUPPORT)
PREFILLED_SYRINGE | INTRAVENOUS | Status: AC
  Filled 2019-11-03: qty 10

## 2019-11-03 MED ORDER — SUGAMMADEX SODIUM 200 MG/2ML IV SOLN
INTRAVENOUS | Status: DC | PRN
  Administered 2019-11-03: 200 mg via INTRAVENOUS

## 2019-11-03 MED ORDER — SODIUM CHLORIDE 0.9 % IV SOLN: INTRAVENOUS | Status: DC | PRN

## 2019-11-03 MED ORDER — EPHEDRINE SULFATE-NACL 50-0.9 MG/10ML-% IV SOSY
PREFILLED_SYRINGE | INTRAVENOUS | Status: DC | PRN
  Administered 2019-11-03: 5 mg via INTRAVENOUS

## 2019-11-03 MED ORDER — BUPIVACAINE HCL (PF) 0.25 % IJ SOLN
INTRAMUSCULAR | Status: DC | PRN
Start: 2019-11-03 — End: 2019-11-03
  Administered 2019-11-03: 29 mL

## 2019-11-03 MED ORDER — HEMOSTATIC AGENTS (NO CHARGE) OPTIME
TOPICAL | Status: DC | PRN
  Administered 2019-11-03: 1 via TOPICAL

## 2019-11-03 MED ORDER — LIDOCAINE 2% (20 MG/ML) 5 ML SYRINGE
INTRAMUSCULAR | Status: AC
  Filled 2019-11-03: qty 5

## 2019-11-03 MED ORDER — ONDANSETRON HCL 4 MG/2ML IJ SOLN
INTRAMUSCULAR | Status: DC | PRN
  Administered 2019-11-03: 4 mg via INTRAVENOUS

## 2019-11-03 MED ORDER — PROPOFOL 10 MG/ML IV BOLUS
INTRAVENOUS | Status: DC | PRN
  Administered 2019-11-03: 90 mg via INTRAVENOUS

## 2019-11-03 MED ORDER — SODIUM CHLORIDE 0.9 % IR SOLN
Status: DC | PRN
  Administered 2019-11-03: 1000 mL

## 2019-11-03 MED ORDER — DOCUSATE SODIUM 100 MG PO CAPS
100.0000 mg | ORAL_CAPSULE | Freq: Two times a day (BID) | ORAL | Status: DC
  Administered 2019-11-03 – 2019-11-05 (×4): 100 mg via ORAL
  Filled 2019-11-03 (×5): qty 1

## 2019-11-03 MED ORDER — HYDROMORPHONE HCL 1 MG/ML IJ SOLN
0.5000 mg | INTRAMUSCULAR | Status: DC | PRN
  Administered 2019-11-03: 0.5 mg via INTRAVENOUS
  Filled 2019-11-03: qty 1

## 2019-11-03 MED ORDER — FENTANYL CITRATE (PF) 100 MCG/2ML IJ SOLN
25.0000 ug | INTRAMUSCULAR | Status: DC | PRN
Start: 2019-11-03 — End: 2019-11-03

## 2019-11-03 MED ORDER — FENTANYL CITRATE (PF) 250 MCG/5ML IJ SOLN
INTRAMUSCULAR | Status: AC
  Filled 2019-11-03: qty 5

## 2019-11-03 MED ORDER — EPHEDRINE 5 MG/ML INJ
INTRAVENOUS | Status: AC
  Filled 2019-11-03: qty 10

## 2019-11-03 MED ORDER — FENTANYL CITRATE (PF) 250 MCG/5ML IJ SOLN
INTRAMUSCULAR | Status: DC | PRN
  Administered 2019-11-03 (×2): 50 ug via INTRAVENOUS
  Administered 2019-11-03 (×2): 25 ug via INTRAVENOUS

## 2019-11-03 MED ORDER — DEXAMETHASONE SODIUM PHOSPHATE 10 MG/ML IJ SOLN
INTRAMUSCULAR | Status: DC | PRN
  Administered 2019-11-03: 10 mg via INTRAVENOUS

## 2019-11-03 MED ORDER — ROCURONIUM BROMIDE 10 MG/ML (PF) SYRINGE
PREFILLED_SYRINGE | INTRAVENOUS | Status: DC | PRN
  Administered 2019-11-03: 40 mg via INTRAVENOUS

## 2019-11-03 MED ORDER — CHLORHEXIDINE GLUCONATE 0.12 % MT SOLN
15.0000 mL | OROMUCOSAL | Status: AC
  Filled 2019-11-03: qty 15

## 2019-11-03 MED ORDER — SODIUM CHLORIDE 0.9 % IV SOLN: INTRAVENOUS | Status: DC

## 2019-11-03 MED ORDER — 0.9 % SODIUM CHLORIDE (POUR BTL) OPTIME
TOPICAL | Status: DC | PRN
  Administered 2019-11-03: 1000 mL

## 2019-11-03 MED ORDER — ONDANSETRON HCL 4 MG/2ML IJ SOLN
INTRAMUSCULAR | Status: AC
  Filled 2019-11-03: qty 2

## 2019-11-03 MED ORDER — TRAMADOL HCL 50 MG PO TABS
50.0000 mg | ORAL_TABLET | Freq: Four times a day (QID) | ORAL | Status: DC | PRN
  Administered 2019-11-03: 50 mg via ORAL
  Filled 2019-11-03: qty 1

## 2019-11-03 MED ORDER — DARBEPOETIN ALFA 60 MCG/0.3ML IJ SOSY
60.0000 ug | PREFILLED_SYRINGE | INTRAMUSCULAR | Status: DC
  Filled 2019-11-03: qty 0.3

## 2019-11-03 MED ORDER — METHOCARBAMOL 500 MG PO TABS: 500.0000 mg | ORAL_TABLET | Freq: Four times a day (QID) | ORAL | Status: DC | PRN

## 2019-11-03 MED ORDER — CHLORHEXIDINE GLUCONATE 0.12 % MT SOLN
OROMUCOSAL | Status: AC
  Administered 2019-11-03: 15 mL
  Filled 2019-11-03: qty 15

## 2019-11-03 MED ORDER — SUCCINYLCHOLINE CHLORIDE 200 MG/10ML IV SOSY
PREFILLED_SYRINGE | INTRAVENOUS | Status: AC
  Filled 2019-11-03: qty 10

## 2019-11-03 MED ORDER — BUPIVACAINE HCL (PF) 0.25 % IJ SOLN
INTRAMUSCULAR | Status: AC
  Filled 2019-11-03: qty 30

## 2019-11-03 MED ORDER — ACETAMINOPHEN 325 MG PO TABS
650.0000 mg | ORAL_TABLET | Freq: Four times a day (QID) | ORAL | Status: DC
  Administered 2019-11-03 – 2019-11-05 (×5): 650 mg via ORAL
  Filled 2019-11-03 (×5): qty 2

## 2019-11-03 MED ORDER — ACETAMINOPHEN 325 MG PO TABS: 650.0000 mg | ORAL_TABLET | Freq: Four times a day (QID) | ORAL | Status: DC | PRN

## 2019-11-03 MED ORDER — PROPOFOL 10 MG/ML IV BOLUS
INTRAVENOUS | Status: AC
  Filled 2019-11-03: qty 20

## 2019-11-03 MED ORDER — CISATRACURIUM BESYLATE 20 MG/10ML IV SOLN
INTRAVENOUS | Status: AC
  Filled 2019-11-03: qty 10

## 2019-11-03 MED ORDER — PHENYLEPHRINE HCL-NACL 10-0.9 MG/250ML-% IV SOLN
INTRAVENOUS | Status: DC | PRN
Start: 2019-11-03 — End: 2019-11-03
  Administered 2019-11-03: 25 ug/min via INTRAVENOUS

## 2019-11-03 MED ORDER — DEXAMETHASONE SODIUM PHOSPHATE 10 MG/ML IJ SOLN
INTRAMUSCULAR | Status: AC
  Filled 2019-11-03: qty 2

## 2019-11-03 SURGICAL SUPPLY — 42 items
ADH SKN CLS APL DERMABOND .7 (GAUZE/BANDAGES/DRESSINGS) ×1
APL PRP STRL LF DISP 70% ISPRP (MISCELLANEOUS) ×1
APPLIER CLIP 5 13 M/L LIGAMAX5 (MISCELLANEOUS) ×3
APR CLP MED LRG 5 ANG JAW (MISCELLANEOUS) ×1
BAG SPEC RTRVL LRG 6X4 10 (ENDOMECHANICALS) ×1
CANISTER SUCT 3000ML PPV (MISCELLANEOUS) ×3 IMPLANT
CHLORAPREP W/TINT 26 (MISCELLANEOUS) ×3 IMPLANT
CLIP APPLIE 5 13 M/L LIGAMAX5 (MISCELLANEOUS) ×1 IMPLANT
CNTNR URN SCR LID CUP LEK RST (MISCELLANEOUS) ×1 IMPLANT
CONT SPEC 4OZ STRL OR WHT (MISCELLANEOUS) ×3
COVER MAYO STAND STRL (DRAPES) IMPLANT
COVER SURGICAL LIGHT HANDLE (MISCELLANEOUS) ×3 IMPLANT
COVER WAND RF STERILE (DRAPES) ×3 IMPLANT
DERMABOND ADVANCED (GAUZE/BANDAGES/DRESSINGS) ×2
DERMABOND ADVANCED .7 DNX12 (GAUZE/BANDAGES/DRESSINGS) ×1 IMPLANT
DRAPE C-ARM 42X120 X-RAY (DRAPES) IMPLANT
ELECT REM PT RETURN 9FT ADLT (ELECTROSURGICAL) ×3
ELECTRODE REM PT RTRN 9FT ADLT (ELECTROSURGICAL) ×1 IMPLANT
GLOVE BIO SURGEON STRL SZ 6 (GLOVE) ×3 IMPLANT
GLOVE INDICATOR 6.5 STRL GRN (GLOVE) ×3 IMPLANT
GOWN STRL REUS W/ TWL LRG LVL3 (GOWN DISPOSABLE) ×3 IMPLANT
GOWN STRL REUS W/TWL LRG LVL3 (GOWN DISPOSABLE) ×9
GRASPER SUT TROCAR 14GX15 (MISCELLANEOUS) ×3 IMPLANT
KIT BASIN OR (CUSTOM PROCEDURE TRAY) ×3 IMPLANT
KIT TURNOVER KIT B (KITS) ×3 IMPLANT
NDL INSUFFLATION 14GA 120MM (NEEDLE) ×1 IMPLANT
NEEDLE INSUFFLATION 14GA 120MM (NEEDLE) ×3 IMPLANT
NS IRRIG 1000ML POUR BTL (IV SOLUTION) ×3 IMPLANT
PAD ARMBOARD 7.5X6 YLW CONV (MISCELLANEOUS) ×3 IMPLANT
POUCH SPECIMEN RETRIEVAL 10MM (ENDOMECHANICALS) ×3 IMPLANT
SCISSORS LAP 5X35 DISP (ENDOMECHANICALS) ×3 IMPLANT
SET CHOLANGIOGRAPH 5 50 .035 (SET/KITS/TRAYS/PACK) IMPLANT
SET IRRIG TUBING LAPAROSCOPIC (IRRIGATION / IRRIGATOR) ×3 IMPLANT
SET TUBE SMOKE EVAC HIGH FLOW (TUBING) ×3 IMPLANT
SLEEVE ENDOPATH XCEL 5M (ENDOMECHANICALS) ×6 IMPLANT
SUT MNCRL AB 4-0 PS2 18 (SUTURE) ×3 IMPLANT
TOWEL GREEN STERILE (TOWEL DISPOSABLE) ×3 IMPLANT
TOWEL GREEN STERILE FF (TOWEL DISPOSABLE) ×3 IMPLANT
TRAY LAPAROSCOPIC MC (CUSTOM PROCEDURE TRAY) ×3 IMPLANT
TROCAR XCEL NON-BLD 11X100MML (ENDOMECHANICALS) ×3 IMPLANT
TROCAR XCEL NON-BLD 5MMX100MML (ENDOMECHANICALS) ×3 IMPLANT
WATER STERILE IRR 1000ML POUR (IV SOLUTION) ×3 IMPLANT

## 2019-11-03 NOTE — Plan of Care (Signed)
  Problem: Education: Goal: Knowledge of General Education information will improve Description: Including pain rating scale, medication(s)/side effects and non-pharmacologic comfort measures Outcome: Completed/Met   Problem: Clinical Measurements: Goal: Diagnostic test results will improve Outcome: Completed/Met Goal: Cardiovascular complication will be avoided Outcome: Completed/Met   Problem: Safety: Goal: Ability to remain free from injury will improve Outcome: Completed/Met   Problem: Skin Integrity: Goal: Risk for impaired skin integrity will decrease Outcome: Completed/Met   Problem: Education: Goal: Knowledge of Pancreatitis treatment and prevention will improve Outcome: Completed/Met

## 2019-11-03 NOTE — Progress Notes (Signed)
[facility] Progress Note     Subjective: CC-  Had a little pain early this AM but is currently pain free. Labs couldn't be drawn this AM after a couple attempts he asked them to stop.   Objective: Vital signs in last 24 hours: Temp:  [98 F (36.7 C)-98.8 F (37.1 C)] 98.8 F (37.1 C) (08/18 0512) Pulse Rate:  [58-74] 58 (08/18 0512) Resp:  [16-25] 18 (08/18 0512) BP: (92-123)/(45-68) 118/55 (08/18 0512) SpO2:  [97 %-99 %] 97 % (08/18 0512) Weight:  [76.4 kg] 76.4 kg (08/17 1057) Last BM Date: 10/31/19  Intake/Output from previous day: 08/17 0701 - 08/18 0700 In: 769.2 [P.O.:120; I.V.:307.2; IV Piggyback:342] Out: 850  Intake/Output this shift: No intake/output data recorded.  PE: Gen:  Alert, NAD, pleasant Pulm:  rate and effort normal Abd: Soft, nontender, nondistended, no mass Skin: no rashes noted, warm and dry  Lab Results:  Recent Labs    11/02/19 0630 11/02/19 0730  WBC 8.6 9.5  HGB 10.3* 10.3*  HCT 33.2* 32.8*  PLT 143* 155   BMET Recent Labs    11/01/19 0451 11/02/19 0630  NA 140 139  K 3.9 5.5*  CL 98 100  CO2 26 25  GLUCOSE 96 78  BUN 37* 48*  CREATININE 7.86* 9.32*  CALCIUM 9.2 9.2   PT/INR No results for input(s): LABPROT, INR in the last 72 hours. CMP     Component Value Date/Time   NA 139 11/02/2019 0630   NA 143 11/26/2016 0925   K 5.5 (H) 11/02/2019 0630   CL 100 11/02/2019 0630   CO2 25 11/02/2019 0630   GLUCOSE 78 11/02/2019 0630   BUN 48 (H) 11/02/2019 0630   BUN 27 11/26/2016 0925   CREATININE 9.32 (H) 11/02/2019 0630   CALCIUM 9.2 11/02/2019 0630   CALCIUM 9.1 07/29/2012 1000   PROT 7.4 11/02/2019 0630   PROT 7.4 03/05/2019 1526   ALBUMIN 3.8 11/02/2019 0630   ALBUMIN 4.4 03/05/2019 1526   AST 43 (H) 11/02/2019 0630   ALT 69 (H) 11/02/2019 0630   ALKPHOS 84 11/02/2019 0630   BILITOT 0.9 11/02/2019 0630   BILITOT 0.3 03/05/2019 1526   GFRNONAA 5 (L) 11/02/2019 0630   GFRAA 6 (L) 11/02/2019 0630    Lipase     Component Value Date/Time   LIPASE 307 (H) 11/02/2019 0630       Studies/Results: No results found.  Anti-infectives: Anti-infectives (From admission, onward)   Start     Dose/Rate Route Frequency Ordered Stop   11/02/19 1415  metroNIDAZOLE (FLAGYL) IVPB 500 mg     Discontinue     500 mg 100 mL/hr over 60 Minutes Intravenous Every 8 hours 11/02/19 1412     11/01/19 1030  cefTRIAXone (ROCEPHIN) 2 g in sodium chloride 0.9 % 100 mL IVPB     Discontinue     2 g 200 mL/hr over 30 Minutes Intravenous Every 24 hours 11/01/19 1026         Assessment/Plan ESRD on HD CAD, prior MI - recent Echo is stable.  Hold plavix (last dose 8/14) Prostate cancer PAF HTN Peripheral neuropathy  Gallstone pancreatitis - Abdominal exam improving, no pain or tenderness currently. Lipase rapidly trended down as of yesterday, no leukocytosis. Will plan to proceed today with laparoscopic cholecystectomy with cholangiogram. Discussed technique and risks of surgery including bleeding, pain, scarring, intraabdominal injury specifically to the common bile duct and sequelae, bile leak, conversion to open surgery, blood clot, pneumonia, heart  attack, stroke, failure to resolve symptoms, etc. His risk of perioperative morbidity is increased due to his history of CAD, ESRD, etc.  Questions welcomed and answered.   FEN - NPO VTE - sq heparin, hold plavix ID - Rocephin 8/16>>   LOS: 3 days    Clovis Riley, [facility] 11/03/2019, 8:08 AM Please see Amion for pager number during day hours 7:00am-4:30pm

## 2019-11-03 NOTE — Progress Notes (Signed)
Progress Note    Roger [name]  OBS:[id number] DOB: 1940/05/15  DOA: 10/31/2019 PCP: Lujean Amel, MD    Brief Narrative:     Medical records reviewed and are as summarized below:  Roger [name] is an 79 y.o. male with medical history significant of end-stage renal disease on hemodialysis Tuesdays Thursdays and Saturdays, hypertension, coronary artery disease, gout, diastolic dysfunction, prostate cancer, prior tobacco abuse and peripheral neuropathy who presented to the ER today with significant abdominal pain nausea vomiting.  found to have gallstone pancreatitis.  Plan for surgery in AM.  Assessment/Plan:   Acute gallstone pancreatitis  Cholelithiasis Suspected cholecystitis Treated with supportive care, bowel rest -Clinically improving, right upper quadrant ultrasound on admission noted cholelithiasis, gallbladder sludge and gallbladder wall thickening -General surgery consulting, clinically pancreatitis has improved -On IV ceftriaxone and Flagyl -Plan for lap chole today -Labs in a.m.  ESRD on hemodialysis -On HD TTS -Nephrology following, dialyzed yesterday  Chronic diastolic CHF -Volume managed with HD  Essential hypertension -Blood pressure soft/low on HD, monitor  peripheral vascular disease:  -holding PO meds  H/o coronary artery disease:  -Stable.   Holding ASA/plavix  Family Communication/Anticipated D/C date and plan/Code Status   DVT prophylaxis: heparin subcu Code Status: Full Code.  Disposition Plan: Status is: Inpatient  Remains inpatient appropriate because:IV treatments appropriate due to intensity of illness or inability to take PO   Dispo: The patient is from: Home              Anticipated d/c is to: Home              Anticipated d/c date is: Likely 48 hours              Patient currently is not medically stable to d/c.   Medical Consultants:    General surgery nephrology  Subjective:  -Feels well, no complaints,  awaiting lap chole today  Objective:    Vitals:   11/03/19 1120 11/03/19 1130 11/03/19 1145 11/03/19 1200  BP: (!) 132/58 (!) 124/46 121/64 (!) 137/44  Pulse: 70 69 62 (!) 59  Resp: (!) 37 19 (!) 22 (!) 23  Temp: 97.6 F (36.4 C)     TempSrc:      SpO2: 100% 100% 100% 100%  Weight:      Height:        Intake/Output Summary (Last 24 hours) at 11/03/2019 1217 Last data filed at 11/03/2019 1116 Gross per 24 hour  Intake 1269.18 ml  Output 20 ml  Net 1249.18 ml   Filed Weights   11/01/19 1940 11/02/19 1057 11/03/19 0909  Weight: 77.9 kg 76.4 kg 76.4 kg    Exam:  Gen: Awake, Alert, Oriented X 3, no distress HEENT: No JVD, left IJ HD catheter Lungs: Clear bilaterally CVS: S1-S2, regular rate rhythm Abd: soft, Non tender, non distended, BS present Extremities: No edema Skin: no new rashes   Data Reviewed:   I have personally reviewed following labs and imaging studies:  Labs: Labs show the following:   Basic Metabolic Panel: Recent Labs  Lab 10/31/19 1253 10/31/19 1253 11/01/19 0451 11/02/19 0630  NA 141  --  140 139  K 4.6   < > 3.9 5.5*  CL 98  --  98 100  CO2 26  --  26 25  GLUCOSE 132*  --  96 78  BUN 27*  --  37* 48*  CREATININE 6.53*  --  7.86* 9.32*  CALCIUM 9.7  --  9.2 9.2  PHOS  --   --   --  5.8*   < > = values in this interval not displayed.   GFR Estimated Creatinine Clearance: 6.1 mL/min (A) (by C-G formula based on SCr of 9.32 mg/dL (H)). Liver Function Tests: Recent Labs  Lab 10/31/19 1253 11/01/19 0451 11/02/19 0630  AST 257* 94* 43*  ALT 151* 114* 69*  ALKPHOS 112 97 84  BILITOT 1.8* 0.8 0.9  PROT 8.8* 8.0 7.4  ALBUMIN 4.5 4.0 3.8   Recent Labs  Lab 10/31/19 1253 11/01/19 0451 11/02/19 0630  LIPASE >10,000* 1,724* 307*   No results for input(s): AMMONIA in the last 168 hours. Coagulation profile No results for input(s): INR, PROTIME in the last 168 hours.  CBC: Recent Labs  Lab 10/31/19 1253 11/01/19 0451  11/02/19 0630 11/02/19 0730  WBC 8.9 8.8 8.6 9.5  HGB 11.7* 11.2* 10.3* 10.3*  HCT 37.6* 35.1* 33.2* 32.8*  MCV 102.7* 100.9* 101.2* 102.5*  PLT 142* 146* 143* 155   Cardiac Enzymes: No results for input(s): CKTOTAL, CKMB, CKMBINDEX, TROPONINI in the last 168 hours. BNP (last 3 results) No results for input(s): PROBNP in the last 8760 hours. CBG: Recent Labs  Lab 11/02/19 1224 11/02/19 1247 11/03/19 1123  GLUCAP 42* 85 95   D-Dimer: No results for input(s): DDIMER in the last 72 hours. Hgb A1c: No results for input(s): HGBA1C in the last 72 hours. Lipid Profile: No results for input(s): CHOL, HDL, LDLCALC, TRIG, CHOLHDL, LDLDIRECT in the last 72 hours. Thyroid function studies: No results for input(s): TSH, T4TOTAL, T3FREE, THYROIDAB in the last 72 hours.  Invalid input(s): FREET3 Anemia work up: No results for input(s): VITAMINB12, FOLATE, FERRITIN, TIBC, IRON, RETICCTPCT in the last 72 hours. Sepsis Labs: Recent Labs  Lab 10/31/19 1253 11/01/19 0451 11/02/19 0630 11/02/19 0730  WBC 8.9 8.8 8.6 9.5    Microbiology Recent Results (from the past 240 hour(s))  SARS Coronavirus 2 by RT PCR (hospital order, performed in [facility] hospital lab) Nasopharyngeal Nasopharyngeal Swab     Status: None   Collection Time: 10/31/19  7:25 PM   Specimen: Nasopharyngeal Swab  Result Value Ref Range Status   SARS Coronavirus 2 NEGATIVE NEGATIVE Final    Comment: (NOTE) SARS-CoV-2 target nucleic acids are NOT DETECTED.  The SARS-CoV-2 RNA is generally detectable in upper and lower respiratory specimens during the acute phase of infection. The lowest concentration of SARS-CoV-2 viral copies this assay can detect is 250 copies / mL. A negative result does not preclude SARS-CoV-2 infection and should not be used as the sole basis for treatment or other patient management decisions.  A negative result may occur with improper specimen collection / handling, submission of specimen  other than nasopharyngeal swab, presence of viral mutation(s) within the areas targeted by this assay, and inadequate number of viral copies (<250 copies / mL). A negative result must be combined with clinical observations, patient history, and epidemiological information.  Fact Sheet for Patients:   StrictlyIdeas.no  Fact Sheet for Healthcare Providers: BankingDealers.co.za  This test is not yet approved or  cleared by the [place] FDA and has been authorized for detection and/or diagnosis of SARS-CoV-2 by FDA under an Emergency Use Authorization (EUA).  This EUA will remain in effect (meaning this test can be used) for the duration of the COVID-19 declaration under Section 564(b)(1) of the Act, 21 U.S.C. section 360bbb-3(b)(1), unless the authorization is terminated or revoked sooner.  Performed at [facility], [place] [address], [place], [place] [address]   MRSA PCR Screening     Status: None   Collection Time: 11/01/19  9:04 PM   Specimen: Nasal Mucosa; Nasopharyngeal  Result Value Ref Range Status   MRSA by PCR NEGATIVE NEGATIVE Final    Comment:        The GeneXpert MRSA Assay (FDA approved for NASAL specimens only), is one component of a comprehensive MRSA colonization surveillance program. It is not intended to diagnose MRSA infection nor to guide or monitor treatment for MRSA infections. Performed at [facility], [place] [address] [place], [place] [address]   Surgical pcr screen     Status: None   Collection Time: 11/03/19  3:13 AM   Specimen: Nasal Mucosa; Nasal Swab  Result Value Ref Range Status   MRSA, PCR NEGATIVE NEGATIVE Final   Staphylococcus aureus NEGATIVE NEGATIVE Final    Comment: (NOTE) The Xpert SA Assay (FDA approved for NASAL specimens in patients 23 years of age and older), is one component of a comprehensive surveillance program. It is not intended to diagnose infection nor  to guide or monitor treatment. Performed at [facility], [place] [address], [place], [place] [address]     Procedures and diagnostic studies:  No results found.  Medications:   . [MAR Hold] Chlorhexidine Gluconate Cloth  6 each Topical Q0600  . [START ON 11/04/2019] darbepoetin (ARANESP) injection - DIALYSIS  60 mcg Intravenous Q Thu-HD  . [MAR Hold] heparin  5,000 Units Subcutaneous Q8H  . [MAR Hold] metoprolol succinate  12.5 mg Oral Daily  . [MAR Hold] midodrine  10 mg Oral QPM  . [MAR Hold] tamsulosin  0.4 mg Oral Daily   Continuous Infusions: . sodium chloride 10 mL/hr at 11/03/19 0953  . [MAR Hold] cefTRIAXone (ROCEPHIN)  IV 2 g (11/02/19 1235)  . dextrose 5% lactated ringers 25 mL/hr at 11/02/19 1613  . [MAR Hold] metronidazole 500 mg (11/03/19 0534)     LOS: 3 days   [facility]  11/03/2019, 12:17 PM

## 2019-11-03 NOTE — Op Note (Signed)
Operative Note  Roger [name] 78 y.o. male [id number]  11/03/2019  Surgeon: Clovis Riley MD FACS  Assistant: Margie Billet PA-C  Procedure performed: Laparoscopic Cholecystectomy  Procedure classification: urgent  Preop diagnosis: gallstone pancreatitis Post-op diagnosis/intraop findings: same, chronic cholecystitis  Specimens: gallbladder  Retained items: none  EBL: minimal  Complications: none  Description of procedure: After obtaining informed consent the patient was brought to the operating room. Antibiotics had been administered. SCD's were applied. General endotracheal anesthesia was initiated and a formal time-out was performed. The abdomen was prepped and draped in the usual sterile fashion and the abdomen was entered using an infraumbilical veress needle after instilling the site with local. Insufflation to 78mmHg was obtained, 85mm trocar and camera inserted, and gross inspection revealed no evidence of injury from our entry or other intraabdominal abnormalities. Two 71mm trocars were introduced in the right midclavicular and right anterior axillary lines under direct visualization and following infiltration with local. An 52mm trocar was placed in the epigastrium. The gallbladder was retracted cephalad and the infundibulum was retracted laterally.  Omental adhesions to the gallbladder were carefully dissected bluntly and with cautery were needed.  A combination of hook electrocautery and blunt dissection was utilized to clear the peritoneum from the neck and cystic duct, circumferentially isolating the cystic artery and cystic duct and lifting the gallbladder from the cystic plate. The critical view of safety was achieved with the cystic artery, cystic duct, and liver bed visualized between them with no other structures. The artery was clipped with two clips proximally and one distally and divided.  A clip was placed on the distal cystic duct and a ductotomy made sharply.  I  attempted to insert the cholangiogram catheter but despite multiple attempts could not get the catheter to thread.  The cholangiogram was aborted.  3 clips were placed on the proximal cystic duct and this was divided sharply. The gallbladder was dissected from the liver plate using electrocautery. Once freed the gallbladder was placed in an endocatch bag and removed intact through the epigastric trocar site. A small amount of bleeding on the liver bed was controlled with cautery. The right upper quadrant was irrigated and aspirated; the effluent was clear. Hemostasis was once again confirmed, and reinspection of the abdomen revealed no injuries. The clips were well opposed without any bile leak from the duct or the liver bed.  Surgicel snow was placed over the liver bed. The 36mm trocar site in the epigastrium was closed with a 0 vicryl in the fascia under direct visualization using a PMI device. The abdomen was desufflated and all trocars removed. The skin incisions were closed with subcuticular monocryl and Dermabond. The patient was awakened, extubated and transported to the recovery room in stable condition.    All counts were correct at the completion of the case.

## 2019-11-03 NOTE — Anesthesia Procedure Notes (Signed)
Procedure Name: Intubation Date/Time: 11/03/2019 10:07 AM Performed by: Shirlyn Goltz, CRNA Pre-anesthesia Checklist: Patient identified, Emergency Drugs available, Suction available and Patient being monitored Patient Re-evaluated:Patient Re-evaluated prior to induction Oxygen Delivery Method: Circle system utilized Preoxygenation: Pre-oxygenation with 100% oxygen Induction Type: IV induction Ventilation: Mask ventilation without difficulty Laryngoscope Size: Mac and 4 Grade View: Grade I Tube type: Oral Tube size: 7.5 mm Number of attempts: 1 Airway Equipment and Method: Stylet Placement Confirmation: ETT inserted through vocal cords under direct vision,  positive ETCO2 and breath sounds checked- equal and bilateral Secured at: 21 cm Tube secured with: Tape Dental Injury: Teeth and Oropharynx as per pre-operative assessment

## 2019-11-03 NOTE — Anesthesia Preprocedure Evaluation (Addendum)
Anesthesia Evaluation  Patient identified by MRN, date of birth, ID band Patient awake    Reviewed: Allergy & Precautions, H&P , NPO status , Patient's Chart, lab work & pertinent test results, reviewed documented beta blocker date and time   Airway Mallampati: II  TM Distance: >3 FB Neck ROM: Full    Dental no notable dental hx. (+) Edentulous Upper, Partial Lower, Poor Dentition, Dental Advisory Given   Pulmonary neg pulmonary ROS, former smoker,    Pulmonary exam normal breath sounds clear to auscultation       Cardiovascular hypertension, Pt. on medications and Pt. on home beta blockers + angina + CAD, + Past MI, + Cardiac Stents and + Peripheral Vascular Disease   Rhythm:Regular Rate:Normal     Neuro/Psych  Headaches, Depression    GI/Hepatic Neg liver ROS, GERD  Medicated,  Endo/Other  negative endocrine ROS  Renal/GU Renal disease  negative genitourinary   Musculoskeletal  (+) Arthritis , Osteoarthritis,    Abdominal   Peds  Hematology  (+) Blood dyscrasia, anemia ,   Anesthesia Other Findings   Reproductive/Obstetrics negative OB ROS                            Anesthesia Physical Anesthesia Plan  ASA: III  Anesthesia Plan: General   Post-op Pain Management:    Induction: Intravenous  PONV Risk Score and Plan: 3 and Ondansetron, Dexamethasone and Midazolam  Airway Management Planned: Oral ETT  Additional Equipment:   Intra-op Plan:   Post-operative Plan: Extubation in OR  Informed Consent: I have reviewed the patients History and Physical, chart, labs and discussed the procedure including the risks, benefits and alternatives for the proposed anesthesia with the patient or authorized representative who has indicated his/her understanding and acceptance.     Dental advisory given  Plan Discussed with: CRNA  Anesthesia Plan Comments:         Anesthesia Quick  Evaluation

## 2019-11-03 NOTE — Progress Notes (Signed)
Attempted to place new PIV for surgery today. Unable to visualize any veins appropriate for large bore PIV. Anesthesia made aware and stated to have CRNA use HD cath for access. CRNA made aware and verbalized understanding.

## 2019-11-03 NOTE — Anesthesia Postprocedure Evaluation (Signed)
Anesthesia Post Note  Patient: Roger [name]  Procedure(s) Performed: LAPAROSCOPIC CHOLECYSTECTOMY (N/A Abdomen) ATTEMPTED INTRAOPERATIVE CHOLANGIOGRAM (N/A Abdomen)     Patient location during evaluation: PACU Anesthesia Type: General Level of consciousness: awake and alert Pain management: pain level controlled Vital Signs Assessment: post-procedure vital signs reviewed and stable Respiratory status: spontaneous breathing, nonlabored ventilation and respiratory function stable Cardiovascular status: blood pressure returned to baseline and stable Postop Assessment: no apparent nausea or vomiting Anesthetic complications: no   No complications documented.  Last Vitals:  Vitals:   11/03/19 1245 11/03/19 1302  BP: (!) 143/54 (!) 143/58  Pulse: (!) 55 (!) 55  Resp: (!) 26 18  Temp: 36.6 C 36.7 C  SpO2: 97% 100%    Last Pain:  Vitals:   11/03/19 1302  TempSrc: Oral  PainSc:                  Sharlett Lienemann,W. EDMOND

## 2019-11-03 NOTE — Progress Notes (Signed)
  [facility] Progress Note   Subjective:  Seen in room - will be going down for surgery shortly (lap chole). Abd pain better. No CP/dyspnea overnight.  Objective Vitals:   11/02/19 1643 11/02/19 2119 11/03/19 0512 11/03/19 0909  BP: (!) 106/49 (!) 102/54 (!) 118/55   Pulse: 62 (!) 59 (!) 58   Resp: 18 18 18    Temp: 98.7 F (37.1 C) 98 F (36.7 C) 98.8 F (37.1 C)   TempSrc:  Oral Oral   SpO2: 99% 98% 97%   Weight:    76.4 kg  Height:    5\' 6"  (1.676 m)   Physical Exam General: Well appearing man, NAD Heart:  RRR; no murmur Lungs: CTAB; no rales Abdomen: soft, non-tender Extremities: No LE edema Dialysis Access: [facility]  Additional Objective Labs: Basic Metabolic Panel: Recent Labs  Lab 10/31/19 1253 11/01/19 0451 11/02/19 0630  NA 141 140 139  K 4.6 3.9 5.5*  CL 98 98 100  CO2 26 26 25   GLUCOSE 132* 96 78  BUN 27* 37* 48*  CREATININE 6.53* 7.86* 9.32*  CALCIUM 9.7 9.2 9.2  PHOS  --   --  5.8*   Liver Function Tests: Recent Labs  Lab 10/31/19 1253 11/01/19 0451 11/02/19 0630  AST 257* 94* 43*  ALT 151* 114* 69*  ALKPHOS 112 97 84  BILITOT 1.8* 0.8 0.9  PROT 8.8* 8.0 7.4  ALBUMIN 4.5 4.0 3.8   Recent Labs  Lab 10/31/19 1253 11/01/19 0451 11/02/19 0630  LIPASE >10,000* 1,724* 307*   CBC: Recent Labs  Lab 10/31/19 1253 10/31/19 1253 11/01/19 0451 11/02/19 0630 11/02/19 0730  WBC 8.9   < > 8.8 8.6 9.5  HGB 11.7*   < > 11.2* 10.3* 10.3*  HCT 37.6*   < > 35.1* 33.2* 32.8*  MCV 102.7*  --  100.9* 101.2* 102.5*  PLT 142*   < > 146* 143* 155   < > = values in this interval not displayed.   Medications: . sodium chloride 10 mL/hr at 11/03/19 0912  . [MAR Hold] cefTRIAXone (ROCEPHIN)  IV 2 g (11/02/19 1235)  . dextrose 5% lactated ringers 25 mL/hr at 11/02/19 1613  . [MAR Hold] metronidazole 500 mg (11/03/19 0534)   . [MAR Hold] Chlorhexidine Gluconate Cloth  6 each Topical Q0600  . [MAR Hold] heparin  5,000 Units Subcutaneous  Q8H  . [MAR Hold] metoprolol succinate  12.5 mg Oral Daily  . [MAR Hold] midodrine  10 mg Oral QPM  . [MAR Hold] tamsulosin  0.4 mg Oral Daily    Dialysis Orders: TTS @ [facility] -> full HD 8/14 4hr, 400/A1.5, EDW 80.5kg, 3K/2Ca, UFP #4, TDC, heparin 4000 bolus - Mircera 59mcg IV q 4 wk (last 8/5) - Calcitriol 2.20mcg PO q HD  Assessment/Plan: 1. Gallstone Pancreatitis: LFTs and Lipase improving s/p IVF + Ceftraixone. No EtOH nor diabetes. Stones in GB on [place]. Surgery taking him for cholecystectomy today. 2. ESRD:Continue HD per TTS schedule. Next HD 8/19 - no heparin. 3. Hypertension/volume:BP controlled, euvolemic on exam. Below prior EDW. 4. Anemiaof ESRD:Hgb 10.3 - due for ESA on 8/19 - will order. 5. Metabolic bone disease:Ca ok, Phos slightly high. Continue binders. 6. PVD 7. CAD: Plavix on hold for surgery.   Veneta Penton, PA-C 11/03/2019, 9:35 AM  [employer]

## 2019-11-03 NOTE — Transfer of Care (Signed)
Immediate Anesthesia Transfer of Care Note  Patient: Roger [name]  Procedure(s) Performed: LAPAROSCOPIC CHOLECYSTECTOMY (N/A Abdomen) ATTEMPTED INTRAOPERATIVE CHOLANGIOGRAM (N/A Abdomen)  Patient Location: PACU  Anesthesia Type:General  Level of Consciousness: awake, alert , oriented and patient cooperative  Airway & Oxygen Therapy: Patient Spontanous Breathing and Patient connected to nasal cannula oxygen  Post-op Assessment: Report given to RN and Post -op Vital signs reviewed and stable  Post vital signs: Reviewed and stable  Last Vitals:  Vitals Value Taken Time  BP    Temp    Pulse 70 11/03/19 1122  Resp 24 11/03/19 1122  SpO2 100 % 11/03/19 1122  Vitals shown include unvalidated device data.  Last Pain:  Vitals:   11/03/19 0600  TempSrc:   PainSc: 0-No pain      Patients Stated Pain Goal: 2 (69/24/93 2419)  Complications: No complications documented.

## 2019-11-04 ENCOUNTER — Encounter (HOSPITAL_COMMUNITY): Payer: Self-pay | Admitting: Surgery

## 2019-11-04 LAB — RENAL FUNCTION PANEL
Albumin: 3.4 g/dL — ABNORMAL LOW (ref 3.5–5.0)
Anion gap: 13 (ref 5–15)
BUN: 40 mg/dL — ABNORMAL HIGH (ref 8–23)
CO2: 23 mmol/L (ref 22–32)
Calcium: 8.7 mg/dL — ABNORMAL LOW (ref 8.9–10.3)
Chloride: 99 mmol/L (ref 98–111)
Creatinine, Ser: 8.26 mg/dL — ABNORMAL HIGH (ref 0.61–1.24)
GFR calc Af Amer: 7 mL/min — ABNORMAL LOW (ref >60)
GFR calc non Af Amer: 6 mL/min — ABNORMAL LOW (ref >60)
Glucose, Bld: 148 mg/dL — ABNORMAL HIGH (ref 70–99)
Phosphorus: 5.5 mg/dL — ABNORMAL HIGH (ref 2.5–4.6)
Potassium: 4.2 mmol/L (ref 3.5–5.1)
Sodium: 135 mmol/L (ref 135–145)

## 2019-11-04 LAB — CBC
HCT: 30.7 % — ABNORMAL LOW (ref 39.0–52.0)
Hemoglobin: 9.8 g/dL — ABNORMAL LOW (ref 13.0–17.0)
MCH: 32.2 pg (ref 26.0–34.0)
MCHC: 31.9 g/dL (ref 30.0–36.0)
MCV: 101 fL — ABNORMAL HIGH (ref 80.0–100.0)
Platelets: 155 10*3/uL (ref 150–400)
RBC: 3.04 MIL/uL — ABNORMAL LOW (ref 4.22–5.81)
RDW: 15.6 % — ABNORMAL HIGH (ref 11.5–15.5)
WBC: 11.4 10*3/uL — ABNORMAL HIGH (ref 4.0–10.5)
nRBC: 0 % (ref 0.0–0.2)

## 2019-11-04 LAB — LIPASE, BLOOD: Lipase: 112 U/L — ABNORMAL HIGH (ref 11–51)

## 2019-11-04 LAB — SURGICAL PATHOLOGY

## 2019-11-04 MED ORDER — POLYETHYLENE GLYCOL 3350 17 G PO PACK: 17.0000 g | PACK | Freq: Every day | ORAL | 0 refills | Status: DC | PRN

## 2019-11-04 MED ORDER — ACETAMINOPHEN 325 MG PO TABS: 650.0000 mg | ORAL_TABLET | Freq: Four times a day (QID) | ORAL | Status: DC | PRN

## 2019-11-04 MED ORDER — HEPARIN SODIUM (PORCINE) 1000 UNIT/ML IJ SOLN
INTRAMUSCULAR | Status: AC
  Administered 2019-11-04: 6000 [IU] via INTRAVENOUS_CENTRAL
  Filled 2019-11-04: qty 6

## 2019-11-04 MED ORDER — DARBEPOETIN ALFA 60 MCG/0.3ML IJ SOSY
PREFILLED_SYRINGE | INTRAMUSCULAR | Status: AC
  Administered 2019-11-04: 60 ug via INTRAVENOUS
  Filled 2019-11-04: qty 0.3

## 2019-11-04 MED ORDER — TRAMADOL HCL 50 MG PO TABS: 50.0000 mg | ORAL_TABLET | Freq: Four times a day (QID) | ORAL | 0 refills | Status: DC | PRN

## 2019-11-04 NOTE — Procedures (Signed)
I was present at this dialysis session. I have reviewed the session itself and made appropriate changes.   Filed Weights   11/02/19 1057 11/03/19 0909 11/04/19 0811  Weight: 76.4 kg 76.4 kg 79.8 kg    Recent Labs  Lab 11/04/19 0846  NA 135  K 4.2  CL 99  CO2 23  GLUCOSE 148*  BUN 40*  CREATININE 8.26*  CALCIUM 8.7*  PHOS 5.5*    Recent Labs  Lab 11/02/19 0730 11/03/19 1453 11/04/19 0500  WBC 9.5 9.3 11.4*  HGB 10.3* 10.7* 9.8*  HCT 32.8* 34.1* 30.7*  MCV 102.5* 102.7* 101.0*  PLT 155 119* 155    Scheduled Meds: . acetaminophen  650 mg Oral Q6H  . Chlorhexidine Gluconate Cloth  6 each Topical Q0600  . darbepoetin (ARANESP) injection - DIALYSIS  60 mcg Intravenous Q Thu-HD  . docusate sodium  100 mg Oral BID  . heparin  5,000 Units Subcutaneous Q8H  . heparin sodium (porcine)      . metoprolol succinate  12.5 mg Oral Daily  . midodrine  10 mg Oral QPM  . tamsulosin  0.4 mg Oral Daily   Continuous Infusions: . sodium chloride 10 mL/hr at 11/03/19 0953  . cefTRIAXone (ROCEPHIN)  IV 2 g (11/02/19 1235)  . metronidazole 500 mg (11/04/19 0030)   PRN Meds:.hydrALAZINE, HYDROmorphone (DILAUDID) injection, iohexol, methocarbamol, ondansetron **OR** ondansetron (ZOFRAN) IV, simethicone, traMADol   [name], MD [facility] 11/04/2019, 11:38 AM

## 2019-11-04 NOTE — Progress Notes (Signed)
Progress Note    Roger [name]  ACZ:[id number] DOB: 09-29-1940  DOA: 10/31/2019 PCP: Lujean Amel, MD    Brief Narrative:   Roger [name] is an 79 y.o. male with medical history significant of end-stage renal disease on hemodialysis Tuesdays Thursdays and Saturdays, hypertension, coronary artery disease, gout, diastolic dysfunction, prostate cancer, prior tobacco abuse and peripheral neuropathy who presented to the ER today with significant abdominal pain nausea vomiting.  found to have gallstone pancreatitis.   Assessment/Plan:   Acute gallstone pancreatitis  Cholelithiasis Suspected cholecystitis Treated with supportive care, bowel rest -Clinically improving, right upper quadrant ultrasound on admission noted cholelithiasis, gallbladder sludge and gallbladder wall thickening -General surgery consulting, clinically pancreatitis has improved -Status post laparoscopic cholecystectomy yesterday  -Clinically improving, advance to full liquid and then hopefully soft diet for dinner -Ambulate, hemodialysis  -Discharge planning, Home tomorrow if stable   ESRD on hemodialysis -On HD TTS -Nephrology following, dialysis today  Chronic diastolic CHF -Volume managed with HD  Essential hypertension -Blood pressure soft/low on HD, monitor  peripheral vascular disease:  -holding PO meds  H/o coronary artery disease:  -Stable.   Holding ASA/plavix  Family Communication/Anticipated D/C date and plan/Code Status   DVT prophylaxis: heparin subcu Code Status: Full Code.  Disposition Plan: Status is: Inpatient  Remains inpatient appropriate because:IV treatments appropriate due to intensity of illness or inability to take PO   Dispo: The patient is from: Home              Anticipated d/c is to: Home              Anticipated d/c date is: Likely tomorrow 8/20              Patient currently is not medically stable to d/c.   Medical Consultants:    General  surgery nephrology  Subjective:  -Feels well, no complaints, underwent lap chole yesterday afternoon, complains of moderate pain only, no nausea or vomiting, started on clears  Objective:    Vitals:   11/04/19 0930 11/04/19 1000 11/04/19 1030 11/04/19 1100  BP: (!) 108/57 (!) 106/54 (!) (P) 102/57 (!) 103/58  Pulse: 66 72 (P) 74 82  Resp:  15 19 20   Temp:      TempSrc:      SpO2:      Weight:      Height:        Intake/Output Summary (Last 24 hours) at 11/04/2019 1201 Last data filed at 11/04/2019 0600 Gross per 24 hour  Intake 778.07 ml  Output 200 ml  Net 578.07 ml   Filed Weights   11/02/19 1057 11/03/19 0909 11/04/19 0811  Weight: 76.4 kg 76.4 kg 79.8 kg    Exam:  Gen: Elderly male sitting up in bed, AAOx3, no distress HEENT: No JVD, left IJ HD catheter noted Lungs clear bilaterally CVS: S1-S2, regular rate rhythm Abdomen: Soft, mildly tender as expected, port sites unremarkable, bowel sounds present Extremities: No edema  Skin: no new rashes   Data Reviewed:   I have personally reviewed following labs and imaging studies:  Labs: Labs show the following:   Basic Metabolic Panel: Recent Labs  Lab 10/31/19 1253 10/31/19 1253 11/01/19 0451 11/01/19 0451 11/02/19 0630 11/02/19 0630 11/03/19 1801 11/04/19 0846  NA 141  --  140  --  139  --  133* 135  K 4.6   < > 3.9   < > 5.5*   < > 5.3* 4.2  CL  98  --  98  --  100  --  98 99  CO2 26  --  26  --  25  --  23 23  GLUCOSE 132*  --  96  --  78  --  200* 148*  BUN 27*  --  37*  --  48*  --  30* 40*  CREATININE 6.53*  --  7.86*  --  9.32*  --  7.14* 8.26*  CALCIUM 9.7  --  9.2  --  9.2  --  8.5* 8.7*  PHOS  --   --   --   --  5.8*  --   --  5.5*   < > = values in this interval not displayed.   GFR Estimated Creatinine Clearance: 7.6 mL/min (A) (by C-G formula based on SCr of 8.26 mg/dL (H)). Liver Function Tests: Recent Labs  Lab 10/31/19 1253 11/01/19 0451 11/02/19 0630 11/03/19 1801  11/04/19 0846  AST 257* 94* 43* 50*  --   ALT 151* 114* 69* 51*  --   ALKPHOS 112 97 84 76  --   BILITOT 1.8* 0.8 0.9 0.8  --   PROT 8.8* 8.0 7.4 7.3  --   ALBUMIN 4.5 4.0 3.8 3.3* 3.4*   Recent Labs  Lab 10/31/19 1253 11/01/19 0451 11/02/19 0630 11/03/19 1801 11/04/19 0846  LIPASE >10,000* 1,724* 307* 135* 112*   No results for input(s): AMMONIA in the last 168 hours. Coagulation profile No results for input(s): INR, PROTIME in the last 168 hours.  CBC: Recent Labs  Lab 11/01/19 0451 11/02/19 0630 11/02/19 0730 11/03/19 1453 11/04/19 0500  WBC 8.8 8.6 9.5 9.3 11.4*  HGB 11.2* 10.3* 10.3* 10.7* 9.8*  HCT 35.1* 33.2* 32.8* 34.1* 30.7*  MCV 100.9* 101.2* 102.5* 102.7* 101.0*  PLT 146* 143* 155 119* 155   Cardiac Enzymes: No results for input(s): CKTOTAL, CKMB, CKMBINDEX, TROPONINI in the last 168 hours. BNP (last 3 results) No results for input(s): PROBNP in the last 8760 hours. CBG: Recent Labs  Lab 11/02/19 1224 11/02/19 1247 11/03/19 1123  GLUCAP 42* 85 95   D-Dimer: No results for input(s): DDIMER in the last 72 hours. Hgb A1c: No results for input(s): HGBA1C in the last 72 hours. Lipid Profile: No results for input(s): CHOL, HDL, LDLCALC, TRIG, CHOLHDL, LDLDIRECT in the last 72 hours. Thyroid function studies: No results for input(s): TSH, T4TOTAL, T3FREE, THYROIDAB in the last 72 hours.  Invalid input(s): FREET3 Anemia work up: No results for input(s): VITAMINB12, FOLATE, FERRITIN, TIBC, IRON, RETICCTPCT in the last 72 hours. Sepsis Labs: Recent Labs  Lab 11/02/19 0630 11/02/19 0730 11/03/19 1453 11/04/19 0500  WBC 8.6 9.5 9.3 11.4*    Microbiology Recent Results (from the past 240 hour(s))  SARS Coronavirus 2 by RT PCR (hospital order, performed in [facility] hospital lab) Nasopharyngeal Nasopharyngeal Swab     Status: None   Collection Time: 10/31/19  7:25 PM   Specimen: Nasopharyngeal Swab  Result Value Ref Range Status   SARS  Coronavirus 2 NEGATIVE NEGATIVE Final    Comment: (NOTE) SARS-CoV-2 target nucleic acids are NOT DETECTED.  The SARS-CoV-2 RNA is generally detectable in upper and lower respiratory specimens during the acute phase of infection. The lowest concentration of SARS-CoV-2 viral copies this assay can detect is 250 copies / mL. A negative result does not preclude SARS-CoV-2 infection and should not be used as the sole basis for treatment or other patient management decisions.  A negative result  may occur with improper specimen collection / handling, submission of specimen other than nasopharyngeal swab, presence of viral mutation(s) within the areas targeted by this assay, and inadequate number of viral copies (<250 copies / mL). A negative result must be combined with clinical observations, patient history, and epidemiological information.  Fact Sheet for Patients:   StrictlyIdeas.no  Fact Sheet for Healthcare Providers: BankingDealers.co.za  This test is not yet approved or  cleared by the [place] FDA and has been authorized for detection and/or diagnosis of SARS-CoV-2 by FDA under an Emergency Use Authorization (EUA).  This EUA will remain in effect (meaning this test can be used) for the duration of the COVID-19 declaration under Section 564(b)(1) of the Act, 21 U.S.C. section 360bbb-3(b)(1), unless the authorization is terminated or revoked sooner.  Performed at [facility], [place] [address], [place], [place] [address]   MRSA PCR Screening     Status: None   Collection Time: 11/01/19  9:04 PM   Specimen: Nasal Mucosa; Nasopharyngeal  Result Value Ref Range Status   MRSA by PCR NEGATIVE NEGATIVE Final    Comment:        The GeneXpert MRSA Assay (FDA approved for NASAL specimens only), is one component of a comprehensive MRSA colonization surveillance program. It is not intended to diagnose MRSA infection nor to guide  or monitor treatment for MRSA infections. Performed at [facility], [place] [address], [place], [place] [address]   Surgical pcr screen     Status: None   Collection Time: 11/03/19  3:13 AM   Specimen: Nasal Mucosa; Nasal Swab  Result Value Ref Range Status   MRSA, PCR NEGATIVE NEGATIVE Final   Staphylococcus aureus NEGATIVE NEGATIVE Final    Comment: (NOTE) The Xpert SA Assay (FDA approved for NASAL specimens in patients 65 years of age and older), is one component of a comprehensive surveillance program. It is not intended to diagnose infection nor to guide or monitor treatment. Performed at [facility], [place] [address], [place], [place] [address]     Procedures and diagnostic studies:  No results found.  Medications:   . acetaminophen  650 mg Oral Q6H  . Chlorhexidine Gluconate Cloth  6 each Topical Q0600  . darbepoetin (ARANESP) injection - DIALYSIS  60 mcg Intravenous Q Thu-HD  . docusate sodium  100 mg Oral BID  . heparin  5,000 Units Subcutaneous Q8H  . heparin sodium (porcine)      . metoprolol succinate  12.5 mg Oral Daily  . midodrine  10 mg Oral QPM  . tamsulosin  0.4 mg Oral Daily   Continuous Infusions: . sodium chloride 10 mL/hr at 11/03/19 0953  . cefTRIAXone (ROCEPHIN)  IV 2 g (11/02/19 1235)  . metronidazole 500 mg (11/04/19 0030)     LOS: 4 days   Domenic Polite  [facility]  11/04/2019, 12:01 PM

## 2019-11-04 NOTE — Evaluation (Addendum)
I agree with the following treatment note after review of the documentation. This session was performed under the supervision of a licensed clinician.   Leighton Ruff, PT, DPT  Acute Rehabilitation Services  Pager: [phone]    Physical Therapy Evaluation Patient Details Name: Roger [name] MRN: [id number] DOB: [birth year] Today's Date: 11/04/2019   History of Present Illness  Pt is 79 yo male who presents to the hospital with nausea, vomiting and abdominal pains. Pt is s/p laparascopic cholecystectomy on 8/18. Pt has PMH of ESRD, HTN, CAD, PVD, CHF, prostate cancer and peripheral neuropathy   Clinical Impression  Pt was evaluated and assessed for the diagnosis and impairments below. Pt required supervison for bed mobility and was min guard to min assist for transfers and ambulation with a RW. Pt was noted to be very impulsive during session and did not respond well to cues, often getting defensive and agitated. Pt noted to have decreased safety awareness with mobility. Pt lives alone but has family assistance with necessary tasks. Recommend HHPT, [facility], and [facility] for this pt at d/c. Pt would continue to benefit from acute therapy in order to increase his independence with functional tasks. Will continue to follow acutely.   Of note: Pt swatted at student PT and supervising therapist politely asked pt not to do that. Pt then becoming very agitated and aggressive towards male therapist stating "I am not going to be talked to like that by a woman. No woman is going to tell me what to do." Pt then stated "get your hands off of me", however, informed pt, therapist was not touching him. Became increasingly agitated at male therapist presence and was on the verge of being physically aggressive, so male therapist left the room. Notified RN and pt's daughter about incident. Pt's daughter reports pt responds better to male staff.     Follow Up Recommendations Home health PT;Supervision for  mobility/OOB (Home help aide)    Equipment Recommendations  None recommended by PT    Recommendations for Other Services OT consult     Precautions / Restrictions Precautions Precautions: Fall Restrictions Weight Bearing Restrictions: No      Mobility  Bed Mobility Overal bed mobility: Needs Assistance Bed Mobility: Supine to Sit;Sit to Supine     Supine to sit: Supervision;HOB elevated Sit to supine: Supervision;HOB elevated   General bed mobility comments: pt required supervision for safety with bed mobility  Transfers Overall transfer level: Needs assistance Equipment used: Rolling walker (2 wheeled) Transfers: Sit to/from Stand Sit to Stand: Min assist         General transfer comment: pt required min assist for lift off assistance and RW with sit<>stand. Pt required verbal cues prior to transfer for hand placement on RW. Pt had notable unsteadiness when achieved standing from transfer and required min assist to regain balance  Ambulation/Gait Ambulation/Gait assistance: Min guard Gait Distance (Feet): 150 Feet Assistive device: Rolling walker (2 wheeled) Gait Pattern/deviations: Step-through pattern;Decreased stride length;Decreased step length - right;Decreased step length - left Gait velocity: decreased   General Gait Details: pt was unsteady and slow with gait. Pt  impulsive and desired to walk faster but with noted unsteadiness, he was cued to maintain slower speed. Pt require multimodal cuing for RW sequencing and proximity to device. Pt demonstrated decreased awareness of deficits and unsafe at times in ambulation with RW  Stairs            Wheelchair Mobility    Modified Rankin (Stroke Patients Only)  Balance Overall balance assessment: Needs assistance Sitting-balance support: No upper extremity supported;Feet supported Sitting balance-Leahy Scale: Fair Sitting balance - Comments: pt albe to sit EOB without UE support   Standing  balance support: During functional activity;Bilateral upper extremity supported;No upper extremity supported Standing balance-Leahy Scale: Poor Standing balance comment: pt able to statically stand without UE support, reliant on RW dynamically                Pertinent Vitals/Pain Pain Assessment: 0-10 Pain Score: 0-No pain    Home Living Family/patient expects to be discharged to:: Private residence Living Arrangements: Alone Available Help at Discharge: Family;Available PRN/intermittently Type of Home: House Home Access: Stairs to enter Entrance Stairs-Rails: Can reach both Entrance Stairs-Number of Steps: 4 Home Layout: One level Home Equipment: Walker - 2 wheels;Walker - 4 wheels;Tub bench;Toilet riser;Grab bars - tub/shower;Cane - single point Additional Comments: pt lives alone but has brother who lives down the street to help with transportation and RW assistance with stair navigation    Prior Function Level of Independence: Needs assistance   Gait / Transfers Assistance Needed: RW.   ADL's / Homemaking Assistance Needed: has friend that comes in to clean and cook for him  Comments: pt requires RW-2 for home ambulation and rollator on hemodialysis days. Pt has brother helps with carrying walker upstairs and transportation to dialysis center. Daughter helps with medical management      Hand Dominance        Extremity/Trunk Assessment   Upper Extremity Assessment Upper Extremity Assessment: Defer to OT evaluation    Lower Extremity Assessment Lower Extremity Assessment: Generalized weakness    Cervical / Trunk Assessment Cervical / Trunk Assessment: Normal  Communication   Communication: No difficulties  Cognition Arousal/Alertness: Awake/alert Behavior During Therapy: Agitated;Impulsive Overall Cognitive Status: Within Functional Limits for tasks assessed             General Comments: pt was impulsive and overconfident with functional tasks. Pt was  aggressive in session and hostile towards male therapist. Strongly recommend male therapist see pt      General Comments      Exercises     Assessment/Plan    PT Assessment Patient needs continued PT services  PT Problem List Decreased strength;Decreased range of motion;Decreased activity tolerance;Decreased balance;Decreased mobility;Decreased coordination;Decreased knowledge of use of DME;Decreased safety awareness;Cardiopulmonary status limiting activity       PT Treatment Interventions DME instruction;Stair training;Gait training;Functional mobility training;Therapeutic activities;Therapeutic exercise;Balance training;Neuromuscular re-education;Patient/family education    PT Goals (Current goals can be found in the Care Plan section)  Acute Rehab PT Goals Patient Stated Goal: get home PT Goal Formulation: With patient Time For Goal Achievement: 11/18/19 Potential to Achieve Goals: Fair    Frequency Min 3X/week   Barriers to discharge        Co-evaluation               AM-PAC PT "6 Clicks" Mobility  Outcome Measure Help needed turning from your back to your side while in a flat bed without using bedrails?: None Help needed moving from lying on your back to sitting on the side of a flat bed without using bedrails?: None Help needed moving to and from a bed to a chair (including a wheelchair)?: A Little Help needed standing up from a chair using your arms (e.g., wheelchair or bedside chair)?: A Little Help needed to walk in hospital room?: A Little Help needed climbing 3-5 steps with a railing? : A Lot 6 Click  Score: 19    End of Session Equipment Utilized During Treatment: Gait belt Activity Tolerance: Treatment limited secondary to agitation;Patient tolerated treatment well Patient left: in bed;with call bell/phone within reach;with bed alarm set;with family/visitor present Nurse Communication: Mobility status (need for male presence with pt care) PT Visit  Diagnosis: Unsteadiness on feet (R26.81);History of falling (Z91.81);Muscle weakness (generalized) (M62.81);Difficulty in walking, not elsewhere classified (R26.2)    Time:  -      Charges:             Gloriann Loan, SPT  Acute Rehabilitation Services  Office: [phone]  11/04/2019, 4:07 PM

## 2019-11-04 NOTE — Progress Notes (Signed)
[facility] Progress Note   Subjective:  No acute events. Pain/soreness better. Eager to eat. Seen on hd.  Objective Vitals:   11/04/19 0930 11/04/19 1000 11/04/19 1030 11/04/19 1100  BP: (!) 108/57 (!) 106/54  (!) 103/58  Pulse: 66 72  82  Resp:  15 19 20   Temp:      TempSrc:      SpO2:      Weight:      Height:       Physical Exam General: Well appearing man, NAD Heart:  RRR; no murmur Lungs: CTAB; no rales Abdomen: surg sites: c/d/i Extremities: No LE edema Dialysis Access: [facility]  Additional Objective Labs: Basic Metabolic Panel: Recent Labs  Lab 11/02/19 0630 11/03/19 1801 11/04/19 0846  NA 139 133* 135  K 5.5* 5.3* 4.2  CL 100 98 99  CO2 25 23 23   GLUCOSE 78 200* 148*  BUN 48* 30* 40*  CREATININE 9.32* 7.14* 8.26*  CALCIUM 9.2 8.5* 8.7*  PHOS 5.8*  --  5.5*   Liver Function Tests: Recent Labs  Lab 11/01/19 0451 11/01/19 0451 11/02/19 0630 11/03/19 1801 11/04/19 0846  AST 94*  --  43* 50*  --   ALT 114*  --  69* 51*  --   ALKPHOS 97  --  84 76  --   BILITOT 0.8  --  0.9 0.8  --   PROT 8.0  --  7.4 7.3  --   ALBUMIN 4.0   < > 3.8 3.3* 3.4*   < > = values in this interval not displayed.   Recent Labs  Lab 11/02/19 0630 11/03/19 1801 11/04/19 0846  LIPASE 307* 135* 112*   CBC: Recent Labs  Lab 11/01/19 0451 11/01/19 0451 11/02/19 0630 11/02/19 0630 11/02/19 0730 11/03/19 1453 11/04/19 0500  WBC 8.8   < > 8.6   < > 9.5 9.3 11.4*  HGB 11.2*   < > 10.3*   < > 10.3* 10.7* 9.8*  HCT 35.1*   < > 33.2*   < > 32.8* 34.1* 30.7*  MCV 100.9*  --  101.2*  --  102.5* 102.7* 101.0*  PLT 146*   < > 143*   < > 155 119* 155   < > = values in this interval not displayed.   Medications: . sodium chloride 10 mL/hr at 11/03/19 0953  . cefTRIAXone (ROCEPHIN)  IV 2 g (11/02/19 1235)  . metronidazole 500 mg (11/04/19 0030)   . acetaminophen  650 mg Oral Q6H  . Chlorhexidine Gluconate Cloth  6 each Topical Q0600  . darbepoetin  (ARANESP) injection - DIALYSIS  60 mcg Intravenous Q Thu-HD  . docusate sodium  100 mg Oral BID  . heparin  5,000 Units Subcutaneous Q8H  . heparin sodium (porcine)      . metoprolol succinate  12.5 mg Oral Daily  . midodrine  10 mg Oral QPM  . tamsulosin  0.4 mg Oral Daily    Dialysis Orders: TTS @ [facility] -> full HD 8/14 4hr, 400/A1.5, EDW 80.5kg, 3K/2Ca, UFP #4, TDC, heparin 4000 bolus - Mircera 19mcg IV q 4 wk (last 8/5) - Calcitriol 2.32mcg PO q HD  Assessment/Plan: 1. Gallstone Pancreatitis: LFTs and Lipase improving s/p IVF + Ceftraixone. No EtOH nor diabetes. Stones in GB on [place]. S/p lap choley 8/18. Advancing diet 2. ESRD:Continue HD per TTS schedule. 3. Hypertension/volume:BP controlled, euvolemic on exam. Below prior EDW. 4. Anemiaof ESRD:Hgb 10.3 - due for ESA on 8/19 -  will order. 5. Metabolic bone disease:Ca ok, Phos slightly high. Continue binders. 6. PVD 7. CAD: Plavix held for surgery.   [name], MD [facility] 11/04/2019, 11:39 AM

## 2019-11-04 NOTE — Plan of Care (Signed)
  Problem: Activity: Goal: Risk for activity intolerance will decrease Outcome: Progressing   

## 2019-11-04 NOTE — Discharge Instructions (Signed)
[facility] ° °Please arrive at least 30 min before your appointment to complete your check in paperwork.  If you are unable to arrive 30 min prior to your appointment time we may have to cancel or reschedule you. °LAPAROSCOPIC SURGERY: POST OP INSTRUCTIONS °Always review your discharge instruction sheet given to you by the facility where your surgery was performed. °IF YOU HAVE DISABILITY OR FAMILY LEAVE FORMS, YOU MUST BRING THEM TO THE OFFICE FOR PROCESSING.   °DO NOT GIVE THEM TO YOUR DOCTOR. ° °PAIN CONTROL ° °1. First take acetaminophen (Tylenol) AND/or ibuprofen (Advil) to control your pain after surgery.  Follow directions on package.  Taking acetaminophen (Tylenol) and/or ibuprofen (Advil) regularly after surgery will help to control your pain and lower the amount of prescription pain medication you may need.  You should not take more than 4,000 mg (4 grams) of acetaminophen (Tylenol) in 24 hours.  You should not take ibuprofen (Advil), aleve, motrin, naprosyn or other NSAIDS if you have a history of stomach ulcers or chronic kidney disease.  °2. A prescription for pain medication may be given to you upon discharge.  Take your pain medication as prescribed, if you still have uncontrolled pain after taking acetaminophen (Tylenol) or ibuprofen (Advil). °3. Use ice packs to help control pain. °4. If you need a refill on your pain medication, please contact your pharmacy.  They will contact our office to request authorization. Prescriptions will not be filled after 5pm or on week-ends. ° °HOME MEDICATIONS °5. Take your usually prescribed medications unless otherwise directed. ° °DIET °6. You should follow a light diet the first few days after arrival home.  Be sure to include lots of fluids daily. Avoid fatty, fried foods.  ° °CONSTIPATION °7. It is common to experience some constipation after surgery and if you are taking pain medication.  Increasing fluid intake and taking a stool  softener (such as Colace) will usually help or prevent this problem from occurring.  A mild laxative (Milk of Magnesia or Miralax) should be taken according to package instructions if there are no bowel movements after 48 hours. ° °WOUND/INCISION CARE °8. Most patients will experience some swelling and bruising in the area of the incisions.  Ice packs will help.  Swelling and bruising can take several days to resolve.  °9. Unless discharge instructions indicate otherwise, follow guidelines below  °a. STERI-STRIPS - you may remove your outer bandages 48 hours after surgery, and you may shower at that time.  You have steri-strips (small skin tapes) in place directly over the incision.  These strips should be left on the skin for 7-10 days.   °b. DERMABOND/SKIN GLUE - you may shower in 24 hours.  The glue will flake off over the next 2-3 weeks. °10. Any sutures or staples will be removed at the office during your follow-up visit. ° °ACTIVITIES °11. You may resume regular (light) daily activities beginning the next day--such as daily self-care, walking, climbing stairs--gradually increasing activities as tolerated.  You may have sexual intercourse when it is comfortable.  Refrain from any heavy lifting or straining until approved by your doctor. °a. You may drive when you are no longer taking prescription pain medication, you can comfortably wear a seatbelt, and you can safely maneuver your car and apply brakes. ° °FOLLOW-UP °12. You should see your doctor in the office for a follow-up appointment approximately 2-3 weeks after your surgery.  You should have been given your post-op/follow-up appointment when   your surgery was scheduled.  If you did not receive a post-op/follow-up appointment, make sure that you call for this appointment within a day or two after you arrive home to insure a convenient appointment time. ° ° °WHEN TO CALL YOUR DOCTOR: °1. Fever over 101.0 °2. Inability to urinate °3. Continued bleeding from  incision. °4. Increased pain, redness, or drainage from the incision. °5. Increasing abdominal pain ° °The clinic staff is available to answer your questions during regular business hours.  Please don’t hesitate to call and ask to speak to one of the nurses for clinical concerns.  If you have a medical emergency, go to the nearest emergency room or call 911.  A surgeon from [facility] is always on call at the hospital. °[address], [address], [place], [place]  [address] ? [address] [address], [place], [place]   [address] °[phone] ? [phone] ? FAX [phone] ° °

## 2019-11-04 NOTE — Progress Notes (Addendum)
[facility] Progress Note  1 Day Post-Op  Subjective: CC-  Abdomen sore from surgery yesterday, mostly in the RUQ. Denies n/v. Tolerating liquids. He is on tylenol and took tramadol once last night. Renal function and lipase pending.  Objective: Vital signs in last 24 hours: Temp:  [97.6 F (36.4 C)-98.7 F (37.1 C)] 98.2 F (36.8 C) (08/19 0811) Pulse Rate:  [55-70] 61 (08/19 0900) Resp:  [14-37] 14 (08/19 0830) BP: (106-156)/(44-83) 106/62 (08/19 0900) SpO2:  [97 %-100 %] 98 % (08/19 0811) Weight:  [79.8 kg] 79.8 kg (08/19 0811) Last BM Date: 10/31/19  Intake/Output from previous day: 08/18 0701 - 08/19 0700 In: 1278.1 [P.O.:520; I.V.:500; IV Piggyback:258.1] Out: 220 [Urine:200; Blood:20] Intake/Output this shift: No intake/output data recorded.  PE: Gen:  Alert, NAD, pleasant Pulm:  rate and effort normal Abd: Soft, ND, mild RUQ TTP, +BS, lap incisions C/D/I  Lab Results:  Recent Labs    11/03/19 1453 11/04/19 0500  WBC 9.3 11.4*  HGB 10.7* 9.8*  HCT 34.1* 30.7*  PLT 119* 155   BMET Recent Labs    11/02/19 0630 11/03/19 1801  NA 139 133*  K 5.5* 5.3*  CL 100 98  CO2 25 23  GLUCOSE 78 200*  BUN 48* 30*  CREATININE 9.32* 7.14*  CALCIUM 9.2 8.5*   PT/INR No results for input(s): LABPROT, INR in the last 72 hours. CMP     Component Value Date/Time   NA 133 (L) 11/03/2019 1801   NA 143 11/26/2016 0925   K 5.3 (H) 11/03/2019 1801   CL 98 11/03/2019 1801   CO2 23 11/03/2019 1801   GLUCOSE 200 (H) 11/03/2019 1801   BUN 30 (H) 11/03/2019 1801   BUN 27 11/26/2016 0925   CREATININE 7.14 (H) 11/03/2019 1801   CALCIUM 8.5 (L) 11/03/2019 1801   CALCIUM 9.1 07/29/2012 1000   PROT 7.3 11/03/2019 1801   PROT 7.4 03/05/2019 1526   ALBUMIN 3.3 (L) 11/03/2019 1801   ALBUMIN 4.4 03/05/2019 1526   AST 50 (H) 11/03/2019 1801   ALT 51 (H) 11/03/2019 1801   ALKPHOS 76 11/03/2019 1801   BILITOT 0.8 11/03/2019 1801   BILITOT 0.3 03/05/2019 1526    GFRNONAA 7 (L) 11/03/2019 1801   GFRAA 8 (L) 11/03/2019 1801   Lipase     Component Value Date/Time   LIPASE 135 (H) 11/03/2019 1801       Studies/Results: No results found.  Anti-infectives: Anti-infectives (From admission, onward)   Start     Dose/Rate Route Frequency Ordered Stop   11/02/19 1415  metroNIDAZOLE (FLAGYL) IVPB 500 mg        500 mg 100 mL/hr over 60 Minutes Intravenous Every 8 hours 11/02/19 1412     11/01/19 1030  cefTRIAXone (ROCEPHIN) 2 g in sodium chloride 0.9 % 100 mL IVPB        2 g 200 mL/hr over 30 Minutes Intravenous Every 24 hours 11/01/19 1026         Assessment/Plan ESRD on HD CAD, prior MI - recent Echo is stable. On plavix (last dose 8/14) Prostate cancer PAF HTN Peripheral neuropathy  Gallstone pancreatitis S/p laparoscopic cholecystectomy 8/18 Dr. Kae Heller - POD#1 - Ok to advance diet as tolerated. Needs to mobilize. Will ask PT to see. Once tolerating diet, mobilizing well, and pain controlled ok for discharge from surgical standpoint. Discharge instructions and follow up info on AVS. I will sent tramadol rx to his pharmacy. Ok to restart plavix.  ID -  rocephin/flagyl 8/16>> FEN - CLD ADAT VTE - SCDs, sq heparin Foley - none Follow up - [facility]   LOS: 4 days    Wellington Hampshire, [facility] 11/04/2019, 9:17 AM Please see Amion for pager number during day hours 7:00am-4:30pm

## 2019-11-05 DIAGNOSIS — K8063 Calculus of gallbladder and bile duct with acute cholecystitis with obstruction: Secondary | ICD-10-CM

## 2019-11-05 LAB — BASIC METABOLIC PANEL
Anion gap: 13 (ref 5–15)
BUN: 21 mg/dL (ref 8–23)
CO2: 25 mmol/L (ref 22–32)
Calcium: 8.3 mg/dL — ABNORMAL LOW (ref 8.9–10.3)
Chloride: 97 mmol/L — ABNORMAL LOW (ref 98–111)
Creatinine, Ser: 5.93 mg/dL — ABNORMAL HIGH (ref 0.61–1.24)
GFR calc Af Amer: 10 mL/min — ABNORMAL LOW (ref >60)
GFR calc non Af Amer: 8 mL/min — ABNORMAL LOW (ref >60)
Glucose, Bld: 94 mg/dL (ref 70–99)
Potassium: 3.9 mmol/L (ref 3.5–5.1)
Sodium: 135 mmol/L (ref 135–145)

## 2019-11-05 LAB — CBC
HCT: 32.3 % — ABNORMAL LOW (ref 39.0–52.0)
Hemoglobin: 10.2 g/dL — ABNORMAL LOW (ref 13.0–17.0)
MCH: 31.9 pg (ref 26.0–34.0)
MCHC: 31.6 g/dL (ref 30.0–36.0)
MCV: 100.9 fL — ABNORMAL HIGH (ref 80.0–100.0)
Platelets: 167 10*3/uL (ref 150–400)
RBC: 3.2 MIL/uL — ABNORMAL LOW (ref 4.22–5.81)
RDW: 15.8 % — ABNORMAL HIGH (ref 11.5–15.5)
WBC: 8.4 10*3/uL (ref 4.0–10.5)
nRBC: 0 % (ref 0.0–0.2)

## 2019-11-05 NOTE — Discharge Summary (Signed)
Physician Discharge Summary  Roger [name] HRC:[id number] DOB: 28-Mar-1940 DOA: 10/31/2019  PCP: Lujean Amel, MD  Admit date: 10/31/2019 Discharge date: 11/05/2019  Time spent: 35 minutes  Recommendations for Outpatient Follow-up:  1. PCP in 1 week 2. Gen surgery for post op FU in 1-2 weeks   Discharge Diagnoses:  Principal Problem:   Acute gall stone pancreatitis Active Problems:   Peripheral vascular disease, unspecified (HCC)   CAD (coronary artery disease)   ESRD (end stage renal disease) ([place])   Essential hypertension   Pancreatitis   Gallbladder & bile duct stone, acute cholecystitis and obstruction   Hypotension  Discharge Condition: stable  Diet recommendation: Renal  Filed Weights   11/03/19 0909 11/04/19 0811 11/04/19 1213  Weight: 76.4 kg 79.8 kg 77.1 kg    History of present illness:  Roger [name] is an 79 y.o. male with medical history significant ofend-stage renal disease on hemodialysis Tuesdays Thursdays and Saturdays, hypertension, coronary artery disease, gout, diastolic dysfunction, prostate cancer, prior tobacco abuse and peripheral neuropathy who presented to the ER today with significant abdominal pain nausea vomiting. found to have gallstone pancreatitis.    Hospital Course:   Acute gallstone pancreatitis  Cholelithiasis Suspected cholecystitis Treated with supportive care, bowel rest -Clinically improving, right upper quadrant ultrasound on admission noted cholelithiasis, gallbladder sludge and gallbladder wall thickening -General surgery consulting, clinically pancreatitis has improved -Status post laparoscopic cholecystectomy 8/18 -Clinically improving, tolerating diet -DC home with family today, FU with [facility] in 1-2weeks  ESRD on hemodialysis -On HD TTS -Nephrology following, dialyzed yesterday  Chronic diastolic CHF -Volume managed with HD  Essential hypertension -Blood pressure soft/low on HD, continue midodrine   peripheral  vascular disease:  -resume antiplatelet agents  H/o coronary artery disease:  -Stable.   -resume ASA/plavix  Procedures:  lap chole 8/18  Discharge Exam: Vitals:   11/05/19 0626 11/05/19 0931  BP: (!) 129/58 121/60  Pulse: 63 (!) 58  Resp: 20 18  Temp:  98 F (36.7 C)  SpO2: 100% 100%    General: AAOx3 Cardiovascular: S1S2/RRR Respiratory: CTAB  Discharge Instructions   Discharge Instructions    Diet - low sodium heart healthy   Complete by: As directed    Discharge instructions   Complete by: As directed    Soft Renal diet   Discharge wound care:   Complete by: As directed    As above   Increase activity slowly   Complete by: As directed      Allergies as of 11/05/2019   No Known Allergies     Medication List    STOP taking these medications   sucralfate 1 GM/10ML suspension Commonly known as: CARAFATE     TAKE these medications   acetaminophen 325 MG tablet Commonly known as: TYLENOL Take 2 tablets (650 mg total) by mouth every 6 (six) hours as needed for mild pain.   allopurinol 100 MG tablet Commonly known as: ZYLOPRIM Take 1 tablet (100 mg total) by mouth daily.   atorvastatin 80 MG tablet Commonly known as: LIPITOR TAKE 1 TABLET(80 MG) BY MOUTH DAILY AT 6 PM What changed: See the new instructions.   Auryxia 1 GM 210 MG(Fe) tablet Generic drug: ferric citrate Take 420 mg by mouth 2 (two) times daily.   b complex vitamins tablet Take 1 tablet by mouth at bedtime.   calcitRIOL 0.25 MCG capsule Commonly known as: ROCALTROL Take 1 capsule (0.25 mcg total) by mouth Every Tuesday,Thursday,and Saturday with dialysis.   clopidogrel 75  MG tablet Commonly known as: PLAVIX Take 1 tablet (75 mg total) by mouth daily.   ezetimibe 10 MG tablet Commonly known as: ZETIA Take 1 tablet (10 mg total) by mouth daily.   isosorbide mononitrate 30 MG 24 hr tablet Commonly known as: IMDUR Take 1 tablet (30 mg) by mouth once daily in the evening    metoprolol succinate 25 MG 24 hr tablet Commonly known as: TOPROL-XL Take 0.5 tablets (12.5 mg total) by mouth daily. Take 1 tablet (25 mg) by mouth once daily on non-dialysis days   midodrine 10 MG tablet Commonly known as: PROAMATINE Take 1 tablet (10 mg total) by mouth 2 (two) times daily as needed (Twice daily on hemodialysis days). TWICE A DAY ONLY ON HEMODIALYSIS DAYS What changed: when to take this   nitroGLYCERIN 0.4 MG SL tablet Commonly known as: NITROSTAT Place 1 tablet (0.4 mg total) under the tongue every 5 (five) minutes as needed for chest pain.   pantoprazole 40 MG tablet Commonly known as: PROTONIX Take 40 mg by mouth 2 (two) times daily.   polyethylene glycol 17 g packet Commonly known as: MiraLax Take 17 g by mouth daily as needed for mild constipation.   tamsulosin 0.4 MG Caps capsule Commonly known as: FLOMAX Take 0.4 mg by mouth daily.   traMADol 50 MG tablet Commonly known as: ULTRAM Take 1 tablet (50 mg total) by mouth every 6 (six) hours as needed for severe pain.            Discharge Care Instructions  (From admission, onward)         Start     Ordered   11/05/19 0000  Discharge wound care:       Comments: As above   11/05/19 0832         No Known Allergies  Follow-up Information    [facility] up on 11/25/2019.   Specialty: General Surgery Why: 1:30 pm, arrive by 1:00pm for paperwork and check in process.  please bring photo ID and insurance card with you. Contact information: [place] [address] [phone]                The results of significant diagnostics from this hospitalization (including imaging, microbiology, ancillary and laboratory) are listed below for reference.    Significant Diagnostic Studies: CT ABDOMEN PELVIS WO CONTRAST  Result Date: 10/31/2019 CLINICAL DATA:  Nausea for 2 days and vomiting. Concern for pancreatitis. EXAM: CT ABDOMEN AND PELVIS WITHOUT  CONTRAST TECHNIQUE: Multidetector CT imaging of the abdomen and pelvis was performed following the standard protocol without IV contrast. COMPARISON:  MR abdomen dated 06/02/2019 and CT abdomen pelvis dated 10/18/2016 FINDINGS: Lower chest: There is mild bilateral lower lobe atelectasis. Hepatobiliary: No focal liver abnormality is seen. There is a small amount of fat stranding surrounding the gallbladder. There is no definite gallbladder wall thickening. No biliary dilatation. Pancreas: There is a minimal amount of fat stranding near the inferior aspect of the pancreatic body (series 7, image 57). No pancreatic ductal dilatation. Spleen: Normal in size without focal abnormality. Adrenals/Urinary Tract: Adrenal glands are unremarkable. Kidneys are atrophic, without renal calculi, focal lesion, or hydronephrosis. Bladder is unremarkable. Stomach/Bowel: Stomach is within normal limits. Appendix appears normal. There is colonic diverticulosis without evidence of diverticulitis. no evidence of bowel wall thickening, distention, or inflammatory changes. Vascular/Lymphatic: Aortic atherosclerosis. No enlarged abdominal or pelvic lymph nodes. Reproductive: The prostate is enlarged, measuring 5.5 cm in  transverse dimension. Other: No abdominal wall hernia or abnormality. No abdominopelvic ascites. Musculoskeletal: No acute or significant osseous findings. IMPRESSION: 1. There is a small amount of fat stranding surrounding the gallbladder without definite gallbladder wall thickening. Findings are nonspecific but could represent acute cholecystitis. 2. Minimal amount of fat stranding near the pancreatic body is nonspecific but could represent early/mild acute pancreatitis. Aortic Atherosclerosis (ICD10-I70.0). Electronically Signed   By: Zerita Boers M.D.   On: 10/31/2019 19:12   DG Chest 1 View  Result Date: 10/31/2019 CLINICAL DATA:  Nausea. EXAM: CHEST  1 VIEW COMPARISON:  Chest radiograph 05/14/2018. FINDINGS:  Monitoring leads overlie the patient. Dialysis catheter tip projects over the superior vena cava. Stable cardiac and mediastinal contours. Aortic atherosclerosis. Bibasilar heterogeneous opacities. No pleural effusion or pneumothorax. Thoracic spine degenerative changes. IMPRESSION: Bibasilar atelectasis. Electronically Signed   By: Lovey Newcomer M.D.   On: 10/31/2019 16:38   ECHOCARDIOGRAM COMPLETE  Result Date: 10/29/2019    ECHOCARDIOGRAM REPORT   Patient Name:   Roger [name] Date of Exam: 10/29/2019 Medical Rec #:  [id number]      Height:       66.0 in Accession #:    [id number]     Weight:       180.0 lb Date of Birth:  Jan 22, 1941      BSA:          1.912 m Patient Age:    38 years       BP:           112/62 mmHg Patient Gender: M              HR:           62 bpm. Exam Location:  [facility] Procedure: 2D Echo, Cardiac Doppler and Color Doppler Indications:    R06.02 SOB; I50.20* Unspecified systolic (congestive) heart                 failure  History:        Patient has prior history of Echocardiogram examinations, most                 recent 07/25/2017. CHF, CAD, PAD, Aortic Valve Disease,                 Arrythmias:Atrial Fibrillation, Signs/Symptoms:Shortness of                 Breath; Risk Factors:Former Smoker, Hypertension and                 Dyslipidemia.  Sonographer:    Pilar Jarvis RDMS, RVT, RDCS Referring Phys: [id number] [name]  Sonographer Comments: Technically difficult study due to poor echo windows. IMPRESSIONS  1. Left ventricular ejection fraction, by estimation, is 60 to 65%. The left ventricle has normal function. The left ventricle has no regional wall motion abnormalities. There is mild left ventricular hypertrophy. Left ventricular diastolic parameters are consistent with Grade I diastolic dysfunction (impaired relaxation).  2. Right ventricular systolic function is normal. The right ventricular size is normal. Tricuspid regurgitation signal is inadequate for assessing PA pressure.  3.  Moderate mitral valve regurgitation.  4. Aortic valve regurgitation is mild. FINDINGS  Left Ventricle: Left ventricular ejection fraction, by estimation, is 60 to 65%. The left ventricle has normal function. The left ventricle has no regional wall motion abnormalities. The left ventricular internal cavity size was normal in size. There is  mild left ventricular hypertrophy. Left ventricular diastolic parameters are consistent  with Grade I diastolic dysfunction (impaired relaxation). Right Ventricle: The right ventricular size is normal. No increase in right ventricular wall thickness. Right ventricular systolic function is normal. Tricuspid regurgitation signal is inadequate for assessing PA pressure. Left Atrium: Left atrial size was normal in size. Right Atrium: Right atrial size was normal in size. Pericardium: There is no evidence of pericardial effusion. Mitral Valve: The mitral valve is normal in structure. Normal mobility of the mitral valve leaflets. Moderate mitral valve regurgitation. No evidence of mitral valve stenosis. Tricuspid Valve: The tricuspid valve is normal in structure. Tricuspid valve regurgitation is not demonstrated. No evidence of tricuspid stenosis. Aortic Valve: The aortic valve was not well visualized. Aortic valve regurgitation is mild. Mild aortic valve sclerosis is present, with no evidence of aortic valve stenosis. Aortic valve mean gradient measures 4.0 mmHg. Aortic valve peak gradient measures 7.8 mmHg. Aortic valve area, by VTI measures 3.00 cm. Pulmonic Valve: The pulmonic valve was normal in structure. Pulmonic valve regurgitation is not visualized. No evidence of pulmonic stenosis. Aorta: The aortic root is normal in size and structure. Venous: The inferior vena cava is normal in size with greater than 50% respiratory variability, suggesting right atrial pressure of 3 mmHg. IAS/Shunts: No atrial level shunt detected by color flow Doppler.  LEFT VENTRICLE PLAX 2D LVOT diam:      2.10 cm      Diastology LV SV:         92           LV e' lateral:   12.30 cm/s LV SV Index:   48           LV E/e' lateral: 8.0 LVOT Area:     3.46 cm     LV e' medial:    6.20 cm/s                             LV E/e' medial:  15.9  LV Volumes (MOD) LV vol d, MOD A2C: 96.1 ml LV vol d, MOD A4C: 121.0 ml LV vol s, MOD A2C: 45.0 ml LV vol s, MOD A4C: 49.9 ml LV SV MOD A2C:     51.1 ml LV SV MOD A4C:     121.0 ml LV SV MOD BP:      58.9 ml RIGHT VENTRICLE RV Basal diam:  3.00 cm RV S prime:     15.20 cm/s TAPSE (M-mode): 2.3 cm LEFT ATRIUM           Index       RIGHT ATRIUM           Index LA diam:      3.80 cm 1.99 cm/m  RA Area:     12.50 cm LA Vol (A4C): 67.5 ml 35.30 ml/m RA Volume:   23.00 ml  12.03 ml/m  AORTIC VALVE                   PULMONIC VALVE AV Area (Vmax):    2.98 cm    PV Vmax:       1.02 m/s AV Area (Vmean):   2.73 cm    PV Peak grad:  4.2 mmHg AV Area (VTI):     3.00 cm AV Vmax:           139.50 cm/s AV Vmean:          95.850 cm/s AV VTI:  0.308 m AV Peak Grad:      7.8 mmHg AV Mean Grad:      4.0 mmHg LVOT Vmax:         120.00 cm/s LVOT Vmean:        75.500 cm/s LVOT VTI:          0.267 m LVOT/AV VTI ratio: 0.87  AORTA Ao Asc diam: 3.20 cm MITRAL VALVE MV Area (PHT): 3.00 cm     SHUNTS MV Decel Time: 253 msec     Systemic VTI:  0.27 m MV E velocity: 98.50 cm/s   Systemic Diam: 2.10 cm MV A velocity: 118.00 cm/s MV E/A ratio:  0.83 Ida Rogue MD Electronically signed by Ida Rogue MD Signature Date/Time: 10/29/2019/5:44:32 PM    Final    US Abdomen Limited RUQ  Result Date: 10/31/2019 CLINICAL DATA:  Pancreatitis. EXAM: ULTRASOUND ABDOMEN LIMITED RIGHT UPPER QUADRANT COMPARISON:  None. FINDINGS: Gallbladder: Echogenic shadowing gallstones and gallbladder sludge are seen within the gallbladder lumen. Gallbladder wall thickening is noted (7.4 mm). No sonographic Murphy sign noted by sonographer. Common bile duct: Diameter: 6.2 mm Liver: No focal lesion identified. Within  normal limits in parenchymal echogenicity. Portal vein is patent on color Doppler imaging with normal direction of blood flow towards the liver. Other: None. IMPRESSION: Cholelithiasis, gallbladder sludge and gallbladder wall thickening in the absence of a positive sonographic Murphy sign. Further correlation with a nuclear medicine hepatobiliary scan is recommended, as acute cholecystitis cannot completely be excluded. Electronically Signed   By: Virgina Norfolk M.D.   On: 10/31/2019 21:28    Microbiology: Recent Results (from the past 240 hour(s))  SARS Coronavirus 2 by RT PCR (hospital order, performed in [facility] hospital lab) Nasopharyngeal Nasopharyngeal Swab     Status: None   Collection Time: 10/31/19  7:25 PM   Specimen: Nasopharyngeal Swab  Result Value Ref Range Status   SARS Coronavirus 2 NEGATIVE NEGATIVE Final    Comment: (NOTE) SARS-CoV-2 target nucleic acids are NOT DETECTED.  The SARS-CoV-2 RNA is generally detectable in upper and lower respiratory specimens during the acute phase of infection. The lowest concentration of SARS-CoV-2 viral copies this assay can detect is 250 copies / mL. A negative result does not preclude SARS-CoV-2 infection and should not be used as the sole basis for treatment or other patient management decisions.  A negative result may occur with improper specimen collection / handling, submission of specimen other than nasopharyngeal swab, presence of viral mutation(s) within the areas targeted by this assay, and inadequate number of viral copies (<250 copies / mL). A negative result must be combined with clinical observations, patient history, and epidemiological information.  Fact Sheet for Patients:   StrictlyIdeas.no  Fact Sheet for Healthcare Providers: BankingDealers.co.za  This test is not yet approved or  cleared by the [place] FDA and has been authorized for detection and/or  diagnosis of SARS-CoV-2 by FDA under an Emergency Use Authorization (EUA).  This EUA will remain in effect (meaning this test can be used) for the duration of the COVID-19 declaration under Section 564(b)(1) of the Act, 21 U.S.C. section 360bbb-3(b)(1), unless the authorization is terminated or revoked sooner.  Performed at [facility], [place] [address], [place], [place] [address]   MRSA PCR Screening     Status: None   Collection Time: 11/01/19  9:04 PM   Specimen: Nasal Mucosa; Nasopharyngeal  Result Value Ref Range Status   MRSA by PCR NEGATIVE NEGATIVE Final  Comment:        The GeneXpert MRSA Assay (FDA approved for NASAL specimens only), is one component of a comprehensive MRSA colonization surveillance program. It is not intended to diagnose MRSA infection nor to guide or monitor treatment for MRSA infections. Performed at [facility], [place] [address], [place], [place] [address]   Surgical pcr screen     Status: None   Collection Time: 11/03/19  3:13 AM   Specimen: Nasal Mucosa; Nasal Swab  Result Value Ref Range Status   MRSA, PCR NEGATIVE NEGATIVE Final   Staphylococcus aureus NEGATIVE NEGATIVE Final    Comment: (NOTE) The Xpert SA Assay (FDA approved for NASAL specimens in patients 20 years of age and older), is one component of a comprehensive surveillance program. It is not intended to diagnose infection nor to guide or monitor treatment. Performed at [facility], [place] [address], [place], [place] [address]      Labs: Basic Metabolic Panel: Recent Labs  Lab 11/01/19 0451 11/02/19 0630 11/03/19 1801 11/04/19 0846 11/05/19 0617  NA 140 139 133* 135 135  K 3.9 5.5* 5.3* 4.2 3.9  CL 98 100 98 99 97*  CO2 26 25 23 23 25   GLUCOSE 96 78 200* 148* 94  BUN 37* 48* 30* 40* 21  CREATININE 7.86* 9.32* 7.14* 8.26* 5.93*  CALCIUM 9.2 9.2 8.5* 8.7* 8.3*  PHOS  --  5.8*  --  5.5*  --    Liver Function Tests: Recent Labs  Lab  10/31/19 1253 11/01/19 0451 11/02/19 0630 11/03/19 1801 11/04/19 0846  AST 257* 94* 43* 50*  --   ALT 151* 114* 69* 51*  --   ALKPHOS 112 97 84 76  --   BILITOT 1.8* 0.8 0.9 0.8  --   PROT 8.8* 8.0 7.4 7.3  --   ALBUMIN 4.5 4.0 3.8 3.3* 3.4*   Recent Labs  Lab 10/31/19 1253 11/01/19 0451 11/02/19 0630 11/03/19 1801 11/04/19 0846  LIPASE >10,000* 1,724* 307* 135* 112*   No results for input(s): AMMONIA in the last 168 hours. CBC: Recent Labs  Lab 11/02/19 0630 11/02/19 0730 11/03/19 1453 11/04/19 0500 11/05/19 0617  WBC 8.6 9.5 9.3 11.4* 8.4  HGB 10.3* 10.3* 10.7* 9.8* 10.2*  HCT 33.2* 32.8* 34.1* 30.7* 32.3*  MCV 101.2* 102.5* 102.7* 101.0* 100.9*  PLT 143* 155 119* 155 167   Cardiac Enzymes: No results for input(s): CKTOTAL, CKMB, CKMBINDEX, TROPONINI in the last 168 hours. BNP: BNP (last 3 results) No results for input(s): BNP in the last 8760 hours.  ProBNP (last 3 results) No results for input(s): PROBNP in the last 8760 hours.  CBG: Recent Labs  Lab 11/02/19 1224 11/02/19 1247 11/03/19 1123  GLUCAP 42* 85 95   Signed:  Domenic Polite MD.  [facility] 11/05/2019, 11:11 AM

## 2019-11-05 NOTE — Progress Notes (Signed)
2 Days Post-Op  Subjective: Patient feels well today.  Pain is well controlled.  Not eating much as he doesn't like the food, but no nausea.    ROS: See above, otherwise other systems negative  Objective: Vital signs in last 24 hours: Temp:  [97.6 F (36.4 C)-99 F (37.2 C)] 97.6 F (36.4 C) (08/20 0517) Pulse Rate:  [59-82] 63 (08/20 0626) Resp:  [14-20] 20 (08/20 0626) BP: (100-129)/(49-83) 129/58 (08/20 0626) SpO2:  [97 %-100 %] 100 % (08/20 0626) Weight:  [77.1 kg] 77.1 kg (08/19 1213) Last BM Date: 11/04/19  Intake/Output from previous day: 08/19 0701 - 08/20 0700 In: 750 [P.O.:750] Out: 1500  Intake/Output this shift: No intake/output data recorded.  PE: Abd: soft, appropriately tender, +BS, ND, incisions c/d/i  Lab Results:  Recent Labs    11/04/19 0500 11/05/19 0617  WBC 11.4* 8.4  HGB 9.8* 10.2*  HCT 30.7* 32.3*  PLT 155 167   BMET Recent Labs    11/04/19 0846 11/05/19 0617  NA 135 135  K 4.2 3.9  CL 99 97*  CO2 23 25  GLUCOSE 148* 94  BUN 40* 21  CREATININE 8.26* 5.93*  CALCIUM 8.7* 8.3*   PT/INR No results for input(s): LABPROT, INR in the last 72 hours. CMP     Component Value Date/Time   NA 135 11/05/2019 0617   NA 143 11/26/2016 0925   K 3.9 11/05/2019 0617   CL 97 (L) 11/05/2019 0617   CO2 25 11/05/2019 0617   GLUCOSE 94 11/05/2019 0617   BUN 21 11/05/2019 0617   BUN 27 11/26/2016 0925   CREATININE 5.93 (H) 11/05/2019 0617   CALCIUM 8.3 (L) 11/05/2019 0617   CALCIUM 9.1 07/29/2012 1000   PROT 7.3 11/03/2019 1801   PROT 7.4 03/05/2019 1526   ALBUMIN 3.4 (L) 11/04/2019 0846   ALBUMIN 4.4 03/05/2019 1526   AST 50 (H) 11/03/2019 1801   ALT 51 (H) 11/03/2019 1801   ALKPHOS 76 11/03/2019 1801   BILITOT 0.8 11/03/2019 1801   BILITOT 0.3 03/05/2019 1526   GFRNONAA 8 (L) 11/05/2019 0617   GFRAA 10 (L) 11/05/2019 0617   Lipase     Component Value Date/Time   LIPASE 112 (H) 11/04/2019 0846       Studies/Results: No  results found.  Anti-infectives: Anti-infectives (From admission, onward)   Start     Dose/Rate Route Frequency Ordered Stop   11/02/19 1415  metroNIDAZOLE (FLAGYL) IVPB 500 mg  Status:  Discontinued        500 mg 100 mL/hr over 60 Minutes Intravenous Every 8 hours 11/02/19 1412 11/04/19 1209   11/01/19 1030  cefTRIAXone (ROCEPHIN) 2 g in sodium chloride 0.9 % 100 mL IVPB  Status:  Discontinued        2 g 200 mL/hr over 30 Minutes Intravenous Every 24 hours 11/01/19 1026 11/04/19 1209       Assessment/Plan ESRD on HD CAD, prior MI - recent Echo is stable. On plavix (last dose 8/14) Prostate cancer PAF HTN Peripheral neuropathy  Gallstone pancreatitis S/p laparoscopic cholecystectomy 8/18 Dr. Kae Heller - POD#2 - tolerating diet with no nausea, but just doesn't like his food -pain is well controlled -mobilizing -patient is surgically stable for DC home from our standpoint. -D/W Dr. Broadus John -ok for plavix -follow up has been made and narcotic sent to pharmacy already  ID - rocephin/flagyl 8/16>> completed FEN - renal VTE - SCDs, sq heparin Foley - none Follow up - [facility] clinic  LOS: 5 days    Henreitta Cea , [facility] 11/05/2019, 8:22 AM Please see Amion for pager number during day hours 7:00am-4:30pm or 7:00am -11:30am on weekends

## 2019-11-05 NOTE — Progress Notes (Addendum)
Subjective: No complaints, says going home today, tolerated dialysis yesterday  Objective Vital signs in last 24 hours: Vitals:   11/04/19 2042 11/05/19 0517 11/05/19 0626 11/05/19 0931  BP: (!) 107/58  (!) 129/58 121/60  Pulse: 70 69 63 (!) 58  Resp: 18 20 20 18   Temp: 98.7 F (37.1 C) 97.6 F (36.4 C)  98 F (36.7 C)  TempSrc: Oral Oral  Oral  SpO2: 97% 100% 100% 100%  Weight:      Height:       Weight change: 3.4 kg  Physical Exam: General: Sitting up in bed alert NAD Heart: RRR, no murmur, rub or gallop appreciated Lungs: CTA nonlabored breathing Abdomen: Bowel sounds positive, soft nontender surgical sites clean healing Extremities: No pedal edema Dialysis Access: Right IJ PermCath   OP Dialysis Orders: TTS @ [facility] -> full HD 8/14 4hr, 400/A1.5, EDW 80.5kg, 3K/2Ca, UFP #4, TDC, heparin 4000 bolus - Mircera 22mcg IV q 4 wk (last 8/5) - Calcitriol 2.47mcg PO q HD  Problem/Plan: 1.  Gallstone Pancreatitis: Reported cleared  for discharge home today LFTs and Lipase improving s/p IVF + Ceftraixone. No EtOH nor diabetes. Stones in GB on [place]. S/p lap choley 8/18.  2. ESRD: HD per TTS schedule.  [place] 3. Hypertension/volume:BP controlled, euvolemic on exam. Below prior EDW.  New EDW 77.5 kg 4. Anemiaof ESRD:Hgb 10.2 - due for ESA 60 MCG Aranesp given on 8/19 - 5. Metabolic bone disease:Ca ok, Phos 5.5 continue binders. 6. PVD 7. CAD: Plavix held for surgery.   Ernest Haber, PA-C [facility] [phone] 11/05/2019,11:32 AM  LOS: 5 days   Labs: Basic Metabolic Panel: Recent Labs  Lab 11/02/19 0630 11/02/19 0630 11/03/19 1801 11/04/19 0846 11/05/19 0617  NA 139   < > 133* 135 135  K 5.5*   < > 5.3* 4.2 3.9  CL 100   < > 98 99 97*  CO2 25   < > 23 23 25   GLUCOSE 78   < > 200* 148* 94  BUN 48*   < > 30* 40* 21  CREATININE 9.32*   < > 7.14* 8.26* 5.93*  CALCIUM 9.2   < > 8.5* 8.7* 8.3*  PHOS 5.8*  --   --  5.5*  --     < > = values in this interval not displayed.   Liver Function Tests: Recent Labs  Lab 11/01/19 0451 11/01/19 0451 11/02/19 0630 11/03/19 1801 11/04/19 0846  AST 94*  --  43* 50*  --   ALT 114*  --  69* 51*  --   ALKPHOS 97  --  84 76  --   BILITOT 0.8  --  0.9 0.8  --   PROT 8.0  --  7.4 7.3  --   ALBUMIN 4.0   < > 3.8 3.3* 3.4*   < > = values in this interval not displayed.   Recent Labs  Lab 11/02/19 0630 11/03/19 1801 11/04/19 0846  LIPASE 307* 135* 112*   No results for input(s): AMMONIA in the last 168 hours. CBC: Recent Labs  Lab 11/02/19 0630 11/02/19 0630 11/02/19 0730 11/02/19 0730 11/03/19 1453 11/04/19 0500 11/05/19 0617  WBC 8.6   < > 9.5   < > 9.3 11.4* 8.4  HGB 10.3*   < > 10.3*   < > 10.7* 9.8* 10.2*  HCT 33.2*   < > 32.8*   < > 34.1* 30.7* 32.3*  MCV 101.2*  --  102.5*  --  102.7* 101.0* 100.9*  PLT 143*   < > 155   < > 119* 155 167   < > = values in this interval not displayed.   Cardiac Enzymes: No results for input(s): CKTOTAL, CKMB, CKMBINDEX, TROPONINI in the last 168 hours. CBG: Recent Labs  Lab 11/02/19 1224 11/02/19 1247 11/03/19 1123  GLUCAP 42* 85 95    Studies/Results: No results found. Medications:  sodium chloride 10 mL/hr at 11/03/19 0953    acetaminophen  650 mg Oral Q6H   Chlorhexidine Gluconate Cloth  6 each Topical Q0600   darbepoetin (ARANESP) injection - DIALYSIS  60 mcg Intravenous Q Thu-HD   docusate sodium  100 mg Oral BID   heparin  5,000 Units Subcutaneous Q8H   metoprolol succinate  12.5 mg Oral Daily   midodrine  10 mg Oral QPM   tamsulosin  0.4 mg Oral Daily

## 2019-11-05 NOTE — Progress Notes (Signed)
Christel Mormon to be discharged Home  per MD order. Discussed prescriptions and follow up appointments with the patient. Prescriptions given to patient; medication list explained in detail. Patient verbalized understanding.  Skin clean, dry and intact without evidence of skin break down, no evidence of skin tears noted. IV catheter discontinued intact. Site without signs and symptoms of complications. Dressing and pressure applied. Pt denies pain at the site currently. No complaints noted.  Patient free of lines, drains, and wounds.   An After Visit Summary (AVS) was printed and given to the patient. Patient escorted via wheelchair, and discharged home via private auto.  Shela Commons, RN

## 2019-11-06 ENCOUNTER — Telehealth (HOSPITAL_COMMUNITY): Payer: Self-pay | Admitting: Nephrology

## 2019-11-06 NOTE — Telephone Encounter (Signed)
Transition of care contact from inpatient facility  Date of Discharge: 11/05/2019 Date of Contact: 11/06/2019 -- attempted Method of contact: Phone  Attempted to contact patient to discuss transition of care from inpatient admission. Patient did not answer the phone. Message was left on the patient's voicemail with call back number [phone].  Katie Ilia Dimaano, PA-C [facility] Pager [phone]   

## 2019-11-08 ENCOUNTER — Telehealth (HOSPITAL_COMMUNITY): Payer: Self-pay | Admitting: Nephrology

## 2019-11-08 NOTE — Telephone Encounter (Signed)
Transition of care contact from inpatient facility  Date of discharge: 11/05/2019 Date of contact: 11/08/2019 Method: Phone Spoke to: Daughter  Who saw pt past several days   Patient contacted to discuss transition of care from recent inpatient hospitalization. Patient was admitted to [facility] from 8/15- 11/05/2019 with discharge diagnosis of Acute gall stone pancreatitis  Gallbladder & bile duct stone, acute cholecystitis and obstruction improved Status post laparoscopic cholecystectomy 8/18  Medication changes were reviewed. She tells me taking medications as prescribed.  Had HD Saturday with out  prob and plans to be there tomorrow on schedule Has OP follow up apts  with [facility] and PCP

## 2019-11-26 ENCOUNTER — Encounter: Payer: Self-pay | Admitting: Cardiovascular Disease

## 2019-12-02 ENCOUNTER — Other Ambulatory Visit: Payer: Self-pay | Admitting: Cardiovascular Disease

## 2020-01-02 NOTE — Progress Notes (Signed)
Cardiology Office Note    Date:  01/03/2020   ID:  Roger [name], DOB 09-03-1940, MRN [id number]  PCP:  Lujean Amel, MD  Cardiologist:  [name], MD  Electrophysiologist:  None   Chief Complaint: Follow-up  History of Present Illness:   Roger [name] is a 79 y.o. male with history of CAD status post PCI/DES to the LAD in 2018, PAF not on [place] secondary to history of GI bleeding, HFpEF, ESRD on HD (TTS) since 10/2016, PAD, carotid artery disease, anemia of chronic disease, prostate cancer, HTN, HLD, and tobacco use who presents for follow-up of his CAD.  He was admitted to the hospital in 10/2016 with NSTEMI in the setting of GI bleed with anemia.  Diagnostic LHC showed CTO of the RCA with left-to-right collaterals as well as a CTO of the LCx which was a small vessel.  The mid LAD had a 70% stenosis.  There were no good targets for bypass surgery, and in this setting, he subsequently underwent staged PCI/DES to the LAD in 11/2016.  Echo at that time showed normal LV systolic function.  He later developed recurrent GI bleeding on DAPT and has been on monotherapy with Plavix only since.  He was admitted to the hospital in 07/2017 with chest pain, ruling out.  Echo showed normal LV systolic function.  Initially, there was concern for possible vegetations on his aortic valve and mitral valve however upon further evaluation it was felt the valves were heavily calcified and not significantly changed from prior echoes.  Stress testing in 07/2017 and again in 09/2018, performed in the setting of chest pain was nonischemic.  The chest pain as noted in 09/2018 was during dialysis.  He was seen in the office in 02/2019 and was doing well, without chest pain.  His midodrine has been increased to 10 mg twice daily on dialysis days.  He was also having to hold metoprolol and Imdur on the morning of dialysis, and with these changes he had not been experiencing significant hypotension or lightheadedness with  HD.  He did continue to note significant fatigue following HD.  Carotid artery ultrasound in 02/2019 showed stable bilateral ICA 40 to 59% stenosis and antegrade flow in the bilateral vertebral arteries.  He was referred to the [facility] kidney transplant program earlier this year and deemed to not be a candidate for transplant due to age, comorbid conditions, and lack of a living donor.  He was last seen in the office on 09/24/2019 and noted an increase in chest pain and SOB occurring with hemodialysis with associated episodes of hypotension. He was intermittently requiring supplemental oxygen with his dialysis and continued to have abdominal spasms during hemodialysis. He underwent echo on 10/29/2019, that showed an EF of 60-65%, no RWMA, Gr1DD, mild LVH, normal RVSF and ventricular cavity size, moderate mitral regurgitation, and mild aortic valve insufficiency.    He was admitted to the hospital in 10/2019 with suspected gallstone pancreatitis and underwent laparoscopic cholecystectomy.  He has subsequently been referred to [facility] for consideration of renal transplant though has declined an appointment with them given high likelihood that he would also be found to not be a transplant candidate through their health system.  She comes in accompanied by his daughter today.  Since he was last seen he notes an improvement in chest pain and shortness of breath outside of continued intermittent episodes with hemodialysis which seem to be associated with significant episodes of hypotension.  With this, he does  continue to note a longstanding history of hiccups/abdominal spasms of uncertain etiology.  His weight is down 5 pounds today when compared to his last clinic weight.   Labs independently reviewed: 10/2019 - potassium 3.9, BUN 21, SCr 5.93, HGB 10.2, PLT 167, albumin 3.4, AST 50, ALT 51 04/2019 -TC 131, TG 161, HDL 31, LDL 68   Past Medical History:  Diagnosis Date  . Anemia   . Anginal pain ([place])   .  Arthritis    "all over; bad in the legs" (12/04/2016)  . CAD (coronary artery disease)    a. NSTEMI in setting of anemia; b. 10/2016 MV: reversible ant, apical, inflat defect; c. 10/2016 Cath: LM nl, LAD 40p, 48m D1 70, D2 80, RI min irregs, LCX 100ost, RCA 100p, 1043m, RPDA fills via collats from LAD, RPAV small-->Initial conservative Rx in setting of GIB w/ plan for PCI LAD if H/H stable on ASA/Plavix;  d. 12/04/16 s/p PTCA and DES to mLAD; e. 09/2018 MV: EF 54%, no ischemia.  . Carotid disease, bilateral ([facility]   a. 04/2017 U/S: RICA 4011-57RCCA >5>26LICA 4020-35 . Marland Kitchenepression    situational   . Diastolic dysfunction    a. 10/2016 Echo: EF 55-60%, no rwma, Gr2 DD, triv MR, mildly dil LA; b. 07/2017 Echo: EF 60-65%, Gr1 DD. No rwma. AoV leaflet thickening (not felt to be SBE upon further review). Mild MR. Mildly dil LA.  . Marland KitchenSRD [facility] ([facility]   "[place]; [address]; TTS usually; [place] [facility] [place] staying w/daughter" (12/04/2016)  . GERD (gastroesophageal reflux disease)   . GIB (gastrointestinal bleeding) 10/2011   a. 10/2016 3u PRBCs - EGD/Colonoscopy: angiodysplasia w/ diverticulosis. No apparent bleeding. Polypecotmy->tubular adenomas.  . Gout   . Headache    no migraines for years  . Heart murmur   . High cholesterol   . History of blood transfusion 10/18/2016   "related to anemia" (12/04/2016)  . History of kidney stones    years ago  . Hypertension   . Iron deficiency anemia   . Myocardial infarction ([facility] [place]08/05/2016  . Old MI (myocardial infarction)    "found evidence of this on 10/18/2016"  . PAF (paroxysmal atrial fibrillation) ([facility]  . Peripheral vascular disease ([facility]   BLE  . Prostate cancer ([facility]   a. s/p seed implantation.  . Tobacco abuse     Past Surgical History:  Procedure Laterality Date  . AV FISTULA PLACEMENT Right 03/28/2017   Procedure: ARTERIOVENOUS (AV) BRACHIOCEPHALIC FISTULA CREATION;  Surgeon: DiAngelia MouldMD;   [facility] Service: Vascular;  Laterality: Right;  . CARDIAC CATHETERIZATION  10/2016  . CHOLECYSTECTOMY N/A 11/03/2019   Procedure: LAPAROSCOPIC CHOLECYSTECTOMY;  Surgeon: CoClovis RileyMD;  Location: [facility] Service: General;  Laterality: N/A;  . COLONOSCOPY WITH PROPOFOL N/A 10/21/2016   Procedure: COLONOSCOPY WITH PROPOFOL;  Surgeon: GeGatha MayerMD;  Location: [facility] Service: Endoscopy;  Laterality: N/A;  . CORONARY ANGIOPLASTY WITH STENT PLACEMENT  12/04/2016   "1 stent"  . CORONARY STENT INTERVENTION N/A 12/04/2016   Procedure: CORONARY STENT INTERVENTION;  Surgeon: ArWellington HampshireMD;  [facility];  Service: Cardiovascular;  Laterality: N/A;  . ESOPHAGOGASTRODUODENOSCOPY N/A 10/21/2016   Procedure: ESOPHAGOGASTRODUODENOSCOPY (EGD);  Surgeon: GeGatha MayerMD;  [facility];  Service: Endoscopy;  Laterality: N/A;  . INSERTION OF DIALYSIS CATHETER Right 10/22/2016   Procedure:  INSERTION OF TUNNELED DIALYSIS CATHETER;  Surgeon: Elam Dutch, MD;  Location[facility] OR;  Service: Vascular;  Laterality: Right;  . INTRAOPERATIVE CHOLANGIOGRAM N/A 11/03/2019   Procedure: ATTEMPTED INTRAOPERATIVE CHOLANGIOGRAM;  Surgeon: Clovis Riley, MD;  Location: [place];  Service: General;  Laterality: N/A;  . LEFT HEART CATH AND CORONARY ANGIOGRAPHY N/A 10/23/2016   Procedure: LEFT HEART CATH AND CORONARY ANGIOGRAPHY;  Surgeon: Wellington Hampshire, MD;  Location: [place] CV LAB;  Service: Cardiovascular;  Laterality: N/A;  . LIGATION OF ARTERIOVENOUS  FISTULA Right 05/12/2017   Procedure: BANDING OF RIGHT UPPER ARM ARTERIOVENOUS  FISTULA USING 6MM X 10CM GORE TEX VASCULAR GRAFT;  Surgeon: Angelia Mould, MD;  Location: [place];  Service: Vascular;  Laterality: Right;  . LIGATION OF ARTERIOVENOUS  FISTULA Right 07/03/2017   Procedure: LIGATION OF ARTERIOVENOUS  FISTULA RIGHT ARM;  Surgeon: Angelia Mould, MD;  Location: [place];  Service: Vascular;   Laterality: Right;  . TONSILLECTOMY      Current Medications: Current Meds  Medication Sig  . acetaminophen (TYLENOL) 325 MG tablet Take 2 tablets (650 mg total) by mouth every 6 (six) hours as needed for mild pain.  Marland Kitchen allopurinol (ZYLOPRIM) 100 MG tablet Take 1 tablet (100 mg total) by mouth daily.  Marland Kitchen atorvastatin (LIPITOR) 80 MG tablet TAKE 1 TABLET(80 MG) BY MOUTH DAILY AT 6 PM (Patient taking differently: Take 80 mg by mouth every evening. )  . b complex vitamins tablet Take 1 tablet by mouth at bedtime.   . calcitRIOL (ROCALTROL) 0.25 MCG capsule Take 1 capsule (0.25 mcg total) by mouth Every Tuesday,Thursday,and Saturday with dialysis.  Marland Kitchen clopidogrel (PLAVIX) 75 MG tablet Take 1 tablet (75 mg total) by mouth daily.  Marland Kitchen ezetimibe (ZETIA) 10 MG tablet Take 1 tablet (10 mg total) by mouth daily.  . ferric citrate (AURYXIA) 1 GM 210 MG(Fe) tablet Take 420 mg by mouth 2 (two) times daily.   . isosorbide mononitrate (IMDUR) 30 MG 24 hr tablet Take 1 tablet (30 mg) by mouth once daily in the evening  . midodrine (PROAMATINE) 10 MG tablet TAKE 1 TABLET BY MOUTH TWO TIMES DAILY AS NEEDED(TWICE DAILY ON HEMODIALYSIS DAYS).  . nitroGLYCERIN (NITROSTAT) 0.4 MG SL tablet Place 1 tablet (0.4 mg total) under the tongue every 5 (five) minutes as needed for chest pain.  . pantoprazole (PROTONIX) 40 MG tablet Take 40 mg by mouth 2 (two) times daily.   . tamsulosin (FLOMAX) 0.4 MG CAPS capsule Take 0.4 mg by mouth daily.  . traMADol (ULTRAM) 50 MG tablet Take 1 tablet (50 mg total) by mouth every 6 (six) hours as needed for severe pain.  . [DISCONTINUED] metoprolol succinate (TOPROL-XL) 25 MG 24 hr tablet Take 0.5 tablets (12.5 mg total) by mouth daily. Take 1 tablet (25 mg) by mouth once daily on non-dialysis days    Allergies:   Patient has no known allergies.   Social History   Socioeconomic History  . Marital status: Widowed    Spouse name: Not on file  . Number of children: Not on file  .  Years of education: Not on file  . Highest education level: Not on file  Occupational History  . Not on file  Tobacco Use  . Smoking status: Former Smoker    Packs/day: 0.50    Years: 50.00    Pack years: 25.00    Types: Cigarettes    Quit date: 10/18/2016    Years since quitting: 3.2  .  Smokeless tobacco: Never Used  Vaping Use  . Vaping Use: Former  Substance and Sexual Activity  . Alcohol use: No  . Drug use: No  . Sexual activity: Never    Birth control/protection: Abstinence  Other Topics Concern  . Not on file  Social History Narrative  . Not on file   Social Determinants of Health   Financial Resource Strain:   . Difficulty of Paying Living Expenses: Not on file  Food Insecurity:   . Worried About Charity fundraiser in the Last Year: Not on file  . Ran Out of Food in the Last Year: Not on file  Transportation Needs:   . Lack of Transportation (Medical): Not on file  . Lack of Transportation (Non-Medical): Not on file  Physical Activity:   . Days of Exercise per Week: Not on file  . Minutes of Exercise per Session: Not on file  Stress:   . Feeling of Stress : Not on file  Social Connections:   . Frequency of Communication with Friends and Family: Not on file  . Frequency of Social Gatherings with Friends and Family: Not on file  . Attends Religious Services: Not on file  . Active Member of Clubs or Organizations: Not on file  . Attends Archivist Meetings: Not on file  . Marital Status: Not on file     Family History:  The patient's family history includes Cancer in his brother, father, mother, and sister; Diabetes in his brother and father; Heart attack in his father; Hypertension in his brother, daughter, and father.  ROS:   Review of Systems  Constitutional: Positive for malaise/fatigue. Negative for chills, diaphoresis, fever and weight loss.  HENT: Negative for congestion.   Eyes: Negative for discharge and redness.  Respiratory: Positive  for shortness of breath. Negative for cough, sputum production and wheezing.   Cardiovascular: Positive for chest pain. Negative for palpitations, orthopnea, claudication, leg swelling and PND.  Gastrointestinal: Negative for abdominal pain, heartburn, melena, nausea and vomiting.  Musculoskeletal: Negative for falls and myalgias.  Skin: Negative for rash.  Neurological: Positive for dizziness and weakness. Negative for tingling, tremors, sensory change, speech change, focal weakness and loss of consciousness.  Endo/Heme/Allergies: Does not bruise/bleed easily.  Psychiatric/Behavioral: Negative for substance abuse. The patient is not nervous/anxious.   All other systems reviewed and are negative.    EKGs/Labs/Other Studies Reviewed:    Studies reviewed were summarized above. The additional studies were reviewed today:  2D echo 10/29/2019: 1. Left ventricular ejection fraction, by estimation, is 60 to 65%. The  left ventricle has normal function. The left ventricle has no regional  wall motion abnormalities. There is mild left ventricular hypertrophy.  Left ventricular diastolic parameters  are consistent with Grade I diastolic dysfunction (impaired relaxation).  2. Right ventricular systolic function is normal. The right ventricular  size is normal. Tricuspid regurgitation signal is inadequate for assessing  PA pressure.  3. Moderate mitral valve regurgitation.  4. Aortic valve regurgitation is mild.  __________  Carlton Adam MPI 09/2018: Pharmacological myocardial perfusion imaging study with no significant ischemia EF estimated at 54% Inferior wall hypokinesis, likely secondary to GI uptake artifact/attenuation correction processing No EKG changes concerning for ischemia at peak stress or in recovery. CT attenuation images with three-vessel coronary calcification and mild to moderate diffuse aortic atherosclerosis Low risk scan __________  2D echo 07/2017: - Procedure  narrative: Transthoracic echocardiography. Image  quality was suboptimal. Technically difficult study.  - Left ventricle: The cavity  size was normal. Wall thickness was  increased in a pattern of mild LVH. Systolic function was normal.  The estimated ejection fraction was in the range of 60% to 65%.  Wall motion was normal; there were no regional wall motion  abnormalities. Doppler parameters are consistent with abnormal  left ventricular relaxation (grade 1 diastolic dysfunction). The  E/e&' ratio is between 8-15, suggesting indeterminate LV filling  pressure.  - Aortic valve: Thickening and increased echogenicity of the  leaflet tips, consistent with calcification +/- endocarditis.  There is also a mass, more clearly noted in off-axis views of the  valve - this seems to move with the leaflet and likely is a  vegetation given the clinical scenario, although there is no  significant stenosis - which would be expected given some of the  measurements obtained. Trivial regurgitation. Further evaluation  with TEE is recommended.  - Mitral valve: MAC with thickened leaflets. There is a mobile  structure associated with the subvalvular apparatus in the LV,  which may be a partially flail cord. There is a mass on the  anterior leaflet which likely represents endocarditis. Further  evaluation with TEE is recommended. There is mild regurgitation.  There was mild regurgitation.  - Left atrium: The atrium was mildly dilated.  - Inferior vena cava: The vessel was normal in size. The  respirophasic diameter changes were in the normal range (= 50%),  consistent with normal central venous pressure.   Impressions:   - Technically difficult study, although extensive effort to  visualize aortic and mitral valves. There are likely vegetations  of both valves given the clinical scenario, consistent with  endocarditis. LVEF 60-65%. TEE is recommended to  confirm the  diagnosis. There may be a partially flail subvalvular cord of the  mitral apparatus.  __________  [facility] 11/2016:  Ost Cx to Prox Cx lesion, 100 %stenosed.  Prox RCA lesion, 100 %stenosed.  Prox LAD lesion, 40 %stenosed.  Ost 1st Diag to 1st Diag lesion, 70 %stenosed.  Mid RCA to Dist RCA lesion, 100 %stenosed.  Mid LAD lesion, 80 %stenosed.  Post intervention, there is a 0% residual stenosis.  A stent was successfully placed.  Ost 2nd Diag to 2nd Diag lesion, 60 %stenosed.  Successful angioplasty and drug-eluting stent placement to the mid LAD.  Recommendations: Dual antiplatelet therapy for at least 6 months. Aggressive treatment of risk factors. The patient's dialysis schedule is Tuesdays, Thursdays and Saturdays.  The patient seems to have significant disease affecting the right common femoral artery. Recommend an outpatient lower extremity arterial Doppler. __________  [facility] 10/2016:  Ost Cx to Prox Cx lesion, 100 %stenosed.  Prox RCA lesion, 100 %stenosed.  Prox LAD lesion, 40 %stenosed.  Mid LAD lesion, 70 %stenosed.  Ost 1st Diag to 1st Diag lesion, 70 %stenosed.  Ost 2nd Diag to 2nd Diag lesion, 80 %stenosed.  Mid RCA to Dist RCA lesion, 100 %stenosed.  1. Significant three-vessel coronary artery disease. Chronically occluded right coronary artery with left to right collaterals supplying the right PDA. Chronically occluded proximal left circumflex which seems to be a small vessel overall with some left to left collaterals. Most of the lateral wall seems to be supplied by a large ramus branch. The LAD has a discrete 70% stenosis in a tortuous midsegment . The coronary arteries are overall moderately calcified and extremely tortuous likely due to hypertensive heart disease.  2. Moderately elevated left ventricular end-diastolic pressure. Left ventricular angiography was not performed due to kidney disease.  EF was normal by  echo.  Recommendations: I don't think there woud be significant advantage from CABG given that the disease in the LAD does not seem to be critical and the only other graftable vessel would be the right PDA. The OMs seem to be very small and diffusely diseased. The best option is probably to treat medically for now and consider mid LAD PCI in the near future (few months) depending on his clinical course and after making sure he recovers from current illnesses including GI bleed and renal failure.   EKG:  EKG is ordered today.  The EKG ordered today demonstrates sinus bradycardia, 58 bpm, improved first-degree AV block, no acute ST-T changes  Recent Labs: 11/03/2019: ALT 51 11/05/2019: BUN 21; Creatinine, Ser 5.93; Hemoglobin 10.2; Platelets 167; Potassium 3.9; Sodium 135  Recent Lipid Panel    Component Value Date/Time   CHOL 131 05/12/2019 1134   CHOL 198 03/05/2019 1526   TRIG 161 (H) 05/12/2019 1134   HDL 31 (L) 05/12/2019 1134   HDL 33 (L) 03/05/2019 1526   CHOLHDL 4.2 05/12/2019 1134   VLDL 32 05/12/2019 1134   LDLCALC 68 05/12/2019 1134   LDLCALC 125 (H) 03/05/2019 1526    PHYSICAL EXAM:    VS:  BP 120/62 (BP Location: Left Arm, Patient Position: Sitting, Cuff Size: Normal)   Pulse (!) 58   Ht _0  (1.676 m)   Wt 175 lb (79.4 kg)   BMI 28.25 kg/m   BMI: Body mass index is 28.25 kg/m.  Physical Exam Constitutional:      Appearance: He is well-developed.  HENT:     Head: Normocephalic and atraumatic.  Eyes:     General:        Right eye: No discharge.        Left eye: No discharge.  Neck:     Vascular: No JVD.  Cardiovascular:     Rate and Rhythm: Normal rate and regular rhythm.     Pulses: No midsystolic click and no opening snap.          Posterior tibial pulses are 2+ on the right side and 2+ on the left side.     Heart sounds: S1 normal and S2 normal. Heart sounds not distant. Murmur heard. High-pitched blowing holosystolic murmur is present with a grade of  2/6 at the apex.  No friction rub.  Pulmonary:     Effort: Pulmonary effort is normal. No respiratory distress.     Breath sounds: Normal breath sounds. No decreased breath sounds, wheezing or rales.  Chest:     Chest wall: No tenderness.  Abdominal:     General: There is no distension.     Palpations: Abdomen is soft.     Tenderness: There is no abdominal tenderness.  Musculoskeletal:     Cervical back: Normal range of motion.  Skin:    General: Skin is warm and dry.     Nails: There is no clubbing.  Neurological:     Mental Status: He is alert and oriented to person, place, and time.  Psychiatric:        Speech: Speech normal.        Behavior: Behavior normal.        Thought Content: Thought content normal.        Judgment: Judgment normal.     Wt Readings from Last 3 Encounters:  01/03/20 175 lb (79.4 kg)  11/04/19 169 lb 15.6 oz (77.1 kg)  09/24/19 180 lb (  81.6 kg)     ASSESSMENT & PLAN:   1. CAD involving the native coronary arteries without angina: He is doing well without any symptoms concerning for angina.  He does continue to note some chest pain associated with hypotension during hemodialysis episodes.  Outside of these episodes he is symptom-free.  Recent echo demonstrated preserved LVSF.  With his known underlying coronary disease it is certainly reasonable he is having episodes of angina with hypotension associated with dialysis.  In this setting we will discontinue metoprolol altogether in an effort to allow for more permissive BP.  He will continue Imdur on nondialysis days for now.  He remains on long-term clopidogrel with history of prior GI bleed.  Otherwise, he will continue atorvastatin and Zetia.  No plans for ischemic testing at this time.  2. HFpEF: He appears euvolemic and well compensated.  Volume is managed by HD.  3. PAF: No symptoms concerning for arrhythmia recurrence.  He is not on [place] secondary to history of GI bleed.  Hold metoprolol as outlined  below.  4. PAD: His functional status is somewhat limited though no symptoms concerning for lifestyle limiting claudication.  He remains on atorvastatin and clopidogrel.  5. Carotid artery disease: Stable 40 to 59% bilateral ICA stenosis by ultrasound in 02/2019.  Follow-up imaging in 02/2020.  He remains on atorvastatin and clopidogrel.  6. ESRD: He remains on HD on TTS per nephrology.  He remains on midodrine on hemodialysis days.  Has previously been declined for transplant at [facility].  He has declined formal evaluation or transplant at [facility] due to high likelihood that he would be found not to be a candidate given standardization of protocols through health systems.  He continues to have significant episodes of hypotension with dialysis and in this setting we will discontinue metoprolol altogether in an effort to allow for more permissive BP heading into and during hemodialysis sessions.  7. HTN: Blood pressure is well controlled in the office today.  Discontinue metoprolol as outlined above to allow for more permissive BP during hemodialysis sessions.  For now, continue current dose of Imdur.  8. HLD: LDL of 68 from 04/2019.  He remains on atorvastatin and ezetimibe.  Disposition: F/u with Dr. Fletcher Anon or an APP in 3 months.   Medication Adjustments/Labs and Tests Ordered: Current medicines are reviewed at length with the patient today.  Concerns regarding medicines are outlined above. Medication changes, Labs and Tests ordered today are summarized above and listed in the Patient Instructions accessible in Encounters.   Signed, Christell Faith, PA-C 01/03/2020 3:29 PM     [place] [address] [place],  [address] [phone]

## 2020-01-03 ENCOUNTER — Ambulatory Visit (INDEPENDENT_AMBULATORY_CARE_PROVIDER_SITE_OTHER): Payer: Medicare Other | Admitting: Physician Assistant

## 2020-01-03 ENCOUNTER — Encounter: Payer: Self-pay | Admitting: Physician Assistant

## 2020-01-03 ENCOUNTER — Other Ambulatory Visit: Payer: Self-pay

## 2020-01-03 VITALS — BP 120/62 | HR 58 | Ht 66.0 in | Wt 175.0 lb

## 2020-01-03 DIAGNOSIS — I480 Paroxysmal atrial fibrillation: Secondary | ICD-10-CM | POA: Diagnosis not present

## 2020-01-03 DIAGNOSIS — I739 Peripheral vascular disease, unspecified: Secondary | ICD-10-CM | POA: Diagnosis not present

## 2020-01-03 DIAGNOSIS — I779 Disorder of arteries and arterioles, unspecified: Secondary | ICD-10-CM

## 2020-01-03 DIAGNOSIS — I2510 Atherosclerotic heart disease of native coronary artery without angina pectoris: Secondary | ICD-10-CM | POA: Diagnosis not present

## 2020-01-03 DIAGNOSIS — I5032 Chronic diastolic (congestive) heart failure: Secondary | ICD-10-CM

## 2020-01-03 DIAGNOSIS — N186 End stage renal disease: Secondary | ICD-10-CM

## 2020-01-03 DIAGNOSIS — E785 Hyperlipidemia, unspecified: Secondary | ICD-10-CM

## 2020-01-03 DIAGNOSIS — I10 Essential (primary) hypertension: Secondary | ICD-10-CM

## 2020-01-03 NOTE — Patient Instructions (Signed)
Medication Instructions:  Stop Metoprolol.  *If you need a refill on your cardiac medications before your next appointment, please call your pharmacy*   Lab Work:  NONE   If you have labs (blood work) drawn today and your tests are completely normal, you will receive your results only by: Marland Kitchen MyChart Message (if you have MyChart) OR . A paper copy in the mail If you have any lab test that is abnormal or we need to change your treatment, we will call you to review the results.   Testing/Procedures:  NONE     Follow-Up: At [facility], you and your health needs are our priority.  As part of our continuing mission to provide you with exceptional heart care, we have created designated Provider Care Teams.  These Care Teams include your primary Cardiologist (physician) and Advanced Practice Providers (APPs -  Physician Assistants and Nurse Practitioners) who all work together to provide you with the care you need, when you need it.  We recommend signing up for the patient portal called "MyChart".  Sign up information is provided on this After Visit Summary.  MyChart is used to connect with patients for Virtual Visits (Telemedicine).  Patients are able to view lab/test results, encounter notes, upcoming appointments, etc.  Non-urgent messages can be sent to your provider as well.   To learn more about what you can do with MyChart, go to [email or web address].    Your next appointment:   3 month(s)  The format for your next appointment:   In Person  Provider:   You may see [name], MD or one of the following Advanced Practice Providers on your designated Care Team:    [name], NP  Christell Faith, PA-C  Marrianne Mood, PA-C  Cadence Halsey, [place]

## 2020-03-03 ENCOUNTER — Other Ambulatory Visit: Payer: Self-pay | Admitting: Cardiovascular Disease

## 2020-03-19 DIAGNOSIS — N186 End stage renal disease: Secondary | ICD-10-CM | POA: Diagnosis not present

## 2020-03-19 DIAGNOSIS — D509 Iron deficiency anemia, unspecified: Secondary | ICD-10-CM | POA: Diagnosis not present

## 2020-03-19 DIAGNOSIS — Z992 Dependence on renal dialysis: Secondary | ICD-10-CM | POA: Diagnosis not present

## 2020-03-19 DIAGNOSIS — E876 Hypokalemia: Secondary | ICD-10-CM | POA: Diagnosis not present

## 2020-03-19 DIAGNOSIS — N2581 Secondary hyperparathyroidism of renal origin: Secondary | ICD-10-CM | POA: Diagnosis not present

## 2020-03-19 DIAGNOSIS — D689 Coagulation defect, unspecified: Secondary | ICD-10-CM | POA: Diagnosis not present

## 2020-03-19 DIAGNOSIS — D631 Anemia in chronic kidney disease: Secondary | ICD-10-CM | POA: Diagnosis not present

## 2020-03-20 ENCOUNTER — Ambulatory Visit (HOSPITAL_COMMUNITY)
Admission: RE | Admit: 2020-03-20 | Discharge: 2020-03-20 | Disposition: A | Discharge status: Home / Self Care | Payer: Medicare Other | Source: Ambulatory Visit | Attending: Cardiology | Admitting: Cardiology

## 2020-03-20 ENCOUNTER — Other Ambulatory Visit: Payer: Self-pay

## 2020-03-20 DIAGNOSIS — I6523 Occlusion and stenosis of bilateral carotid arteries: Secondary | ICD-10-CM | POA: Diagnosis not present

## 2020-03-21 DIAGNOSIS — E876 Hypokalemia: Secondary | ICD-10-CM | POA: Diagnosis not present

## 2020-03-21 DIAGNOSIS — D509 Iron deficiency anemia, unspecified: Secondary | ICD-10-CM | POA: Diagnosis not present

## 2020-03-21 DIAGNOSIS — N186 End stage renal disease: Secondary | ICD-10-CM | POA: Diagnosis not present

## 2020-03-21 DIAGNOSIS — D689 Coagulation defect, unspecified: Secondary | ICD-10-CM | POA: Diagnosis not present

## 2020-03-21 DIAGNOSIS — N2581 Secondary hyperparathyroidism of renal origin: Secondary | ICD-10-CM | POA: Diagnosis not present

## 2020-03-21 DIAGNOSIS — D631 Anemia in chronic kidney disease: Secondary | ICD-10-CM | POA: Diagnosis not present

## 2020-03-21 DIAGNOSIS — Z992 Dependence on renal dialysis: Secondary | ICD-10-CM | POA: Diagnosis not present

## 2020-03-23 ENCOUNTER — Other Ambulatory Visit: Payer: Self-pay | Admitting: *Deleted

## 2020-03-23 DIAGNOSIS — I6523 Occlusion and stenosis of bilateral carotid arteries: Secondary | ICD-10-CM

## 2020-03-23 DIAGNOSIS — D509 Iron deficiency anemia, unspecified: Secondary | ICD-10-CM | POA: Diagnosis not present

## 2020-03-23 DIAGNOSIS — E876 Hypokalemia: Secondary | ICD-10-CM | POA: Diagnosis not present

## 2020-03-23 DIAGNOSIS — N186 End stage renal disease: Secondary | ICD-10-CM | POA: Diagnosis not present

## 2020-03-23 DIAGNOSIS — Z992 Dependence on renal dialysis: Secondary | ICD-10-CM | POA: Diagnosis not present

## 2020-03-23 DIAGNOSIS — D689 Coagulation defect, unspecified: Secondary | ICD-10-CM | POA: Diagnosis not present

## 2020-03-23 DIAGNOSIS — D631 Anemia in chronic kidney disease: Secondary | ICD-10-CM | POA: Diagnosis not present

## 2020-03-23 DIAGNOSIS — N2581 Secondary hyperparathyroidism of renal origin: Secondary | ICD-10-CM | POA: Diagnosis not present

## 2020-03-25 DIAGNOSIS — N186 End stage renal disease: Secondary | ICD-10-CM | POA: Diagnosis not present

## 2020-03-25 DIAGNOSIS — Z992 Dependence on renal dialysis: Secondary | ICD-10-CM | POA: Diagnosis not present

## 2020-03-25 DIAGNOSIS — N2581 Secondary hyperparathyroidism of renal origin: Secondary | ICD-10-CM | POA: Diagnosis not present

## 2020-03-25 DIAGNOSIS — D689 Coagulation defect, unspecified: Secondary | ICD-10-CM | POA: Diagnosis not present

## 2020-03-25 DIAGNOSIS — D509 Iron deficiency anemia, unspecified: Secondary | ICD-10-CM | POA: Diagnosis not present

## 2020-03-25 DIAGNOSIS — D631 Anemia in chronic kidney disease: Secondary | ICD-10-CM | POA: Diagnosis not present

## 2020-03-25 DIAGNOSIS — E876 Hypokalemia: Secondary | ICD-10-CM | POA: Diagnosis not present

## 2020-03-28 DIAGNOSIS — Z992 Dependence on renal dialysis: Secondary | ICD-10-CM | POA: Diagnosis not present

## 2020-03-28 DIAGNOSIS — N2581 Secondary hyperparathyroidism of renal origin: Secondary | ICD-10-CM | POA: Diagnosis not present

## 2020-03-28 DIAGNOSIS — D631 Anemia in chronic kidney disease: Secondary | ICD-10-CM | POA: Diagnosis not present

## 2020-03-28 DIAGNOSIS — E876 Hypokalemia: Secondary | ICD-10-CM | POA: Diagnosis not present

## 2020-03-28 DIAGNOSIS — D689 Coagulation defect, unspecified: Secondary | ICD-10-CM | POA: Diagnosis not present

## 2020-03-28 DIAGNOSIS — N186 End stage renal disease: Secondary | ICD-10-CM | POA: Diagnosis not present

## 2020-03-28 DIAGNOSIS — D509 Iron deficiency anemia, unspecified: Secondary | ICD-10-CM | POA: Diagnosis not present

## 2020-03-30 DIAGNOSIS — Z992 Dependence on renal dialysis: Secondary | ICD-10-CM | POA: Diagnosis not present

## 2020-03-30 DIAGNOSIS — D509 Iron deficiency anemia, unspecified: Secondary | ICD-10-CM | POA: Diagnosis not present

## 2020-03-30 DIAGNOSIS — D689 Coagulation defect, unspecified: Secondary | ICD-10-CM | POA: Diagnosis not present

## 2020-03-30 DIAGNOSIS — D631 Anemia in chronic kidney disease: Secondary | ICD-10-CM | POA: Diagnosis not present

## 2020-03-30 DIAGNOSIS — N2581 Secondary hyperparathyroidism of renal origin: Secondary | ICD-10-CM | POA: Diagnosis not present

## 2020-03-30 DIAGNOSIS — N186 End stage renal disease: Secondary | ICD-10-CM | POA: Diagnosis not present

## 2020-03-30 DIAGNOSIS — E876 Hypokalemia: Secondary | ICD-10-CM | POA: Diagnosis not present

## 2020-04-01 DIAGNOSIS — E876 Hypokalemia: Secondary | ICD-10-CM | POA: Diagnosis not present

## 2020-04-01 DIAGNOSIS — D631 Anemia in chronic kidney disease: Secondary | ICD-10-CM | POA: Diagnosis not present

## 2020-04-01 DIAGNOSIS — N186 End stage renal disease: Secondary | ICD-10-CM | POA: Diagnosis not present

## 2020-04-01 DIAGNOSIS — D509 Iron deficiency anemia, unspecified: Secondary | ICD-10-CM | POA: Diagnosis not present

## 2020-04-01 DIAGNOSIS — Z992 Dependence on renal dialysis: Secondary | ICD-10-CM | POA: Diagnosis not present

## 2020-04-01 DIAGNOSIS — N2581 Secondary hyperparathyroidism of renal origin: Secondary | ICD-10-CM | POA: Diagnosis not present

## 2020-04-01 DIAGNOSIS — D689 Coagulation defect, unspecified: Secondary | ICD-10-CM | POA: Diagnosis not present

## 2020-04-03 ENCOUNTER — Ambulatory Visit: Payer: Medicare Other | Admitting: Physician Assistant

## 2020-04-04 DIAGNOSIS — D631 Anemia in chronic kidney disease: Secondary | ICD-10-CM | POA: Diagnosis not present

## 2020-04-04 DIAGNOSIS — D689 Coagulation defect, unspecified: Secondary | ICD-10-CM | POA: Diagnosis not present

## 2020-04-04 DIAGNOSIS — D509 Iron deficiency anemia, unspecified: Secondary | ICD-10-CM | POA: Diagnosis not present

## 2020-04-04 DIAGNOSIS — N2581 Secondary hyperparathyroidism of renal origin: Secondary | ICD-10-CM | POA: Diagnosis not present

## 2020-04-04 DIAGNOSIS — N186 End stage renal disease: Secondary | ICD-10-CM | POA: Diagnosis not present

## 2020-04-04 DIAGNOSIS — Z992 Dependence on renal dialysis: Secondary | ICD-10-CM | POA: Diagnosis not present

## 2020-04-04 DIAGNOSIS — E876 Hypokalemia: Secondary | ICD-10-CM | POA: Diagnosis not present

## 2020-04-05 ENCOUNTER — Other Ambulatory Visit: Payer: Self-pay

## 2020-04-05 ENCOUNTER — Encounter: Payer: Self-pay | Admitting: Cardiovascular Disease

## 2020-04-05 ENCOUNTER — Ambulatory Visit (INDEPENDENT_AMBULATORY_CARE_PROVIDER_SITE_OTHER): Payer: Medicare Other | Admitting: Cardiovascular Disease

## 2020-04-05 VITALS — BP 106/52 | HR 55 | Ht 66.0 in | Wt 175.0 lb

## 2020-04-05 DIAGNOSIS — I739 Peripheral vascular disease, unspecified: Secondary | ICD-10-CM

## 2020-04-05 DIAGNOSIS — E785 Hyperlipidemia, unspecified: Secondary | ICD-10-CM | POA: Diagnosis not present

## 2020-04-05 DIAGNOSIS — I2510 Atherosclerotic heart disease of native coronary artery without angina pectoris: Secondary | ICD-10-CM

## 2020-04-05 DIAGNOSIS — I10 Essential (primary) hypertension: Secondary | ICD-10-CM

## 2020-04-05 DIAGNOSIS — I779 Disorder of arteries and arterioles, unspecified: Secondary | ICD-10-CM | POA: Diagnosis not present

## 2020-04-05 NOTE — Patient Instructions (Signed)

## 2020-04-05 NOTE — Progress Notes (Signed)
Cardiology Office Note   Date:  04/05/2020   ID:  Roger [name], DOB Apr 13, 1940, MRN [id number]  PCP:  Roger Amel, MD  Cardiologist:   Roger Sacramento, MD   Chief Complaint  Patient presents with  . Other    3 month follow up. Meds reviewed verbally with patient.       History of Present Illness: Roger [name] is a 80 y.o. male who presents for a follow-up visit regarding coronary artery disease. He has extensive medical problems that include peripheral arterial disease, tobacco use, end-stage renal disease on hemodialysis, anemia, paroxysmal atrial fibrillation not on anticoagulation, prostate cancer, recurrent GI bleed and coronary artery disease. He had unstable angina in August, 2018 in the setting of GI bleed. Cardiac catheterization showed significant three-vessel coronary artery disease with chronically occluded right coronary artery with left-to-right collaterals, chronically occluded proximal left circumflex which seemed to be a small vessel with left to left collaterals and 70% stenosis in the mid LAD. LVEDP was moderately elevated. EF was normal by echo.  No good targets for CABG except in the LAD territory.  The patient underwent staged LAD PCI with drug-eluting stent placement in September.   He was noted to have PAD affecting his right common femoral artery. He had GI bleed on dual antiplatelet therapy and was treated with Plavix monotherapy.  Stress testing in May 2019 showed inferior infarct without ischemia. He had issues with chest pain in the setting of hypotension during dialysis and thus was started on midodrine. He has known history of moderate bilateral carotid disease .  Echocardiogram in August 2021 showed an EF of 60 to 65% with no significant wall motion abnormalities, mild LVH and moderate mitral regurgitation. He was hospitalized in August 2021 with suspected gallstone pancreatitis and underwent laparoscopic cholecystectomy.  He has been doing  reasonably well.  He only gets chest pain when his blood pressure drops during dialysis but usually improved with oxygen therapy.  No bleeding complications with clopidogrel.  He denies claudication.  Past Medical History:  Diagnosis Date  . Anemia   . Anginal pain ([place])   . Arthritis    "all over; bad in the legs" (12/04/2016)  . CAD (coronary artery disease)    a. NSTEMI in setting of anemia; b. 10/2016 MV: reversible ant, apical, inflat defect; c. 10/2016 Cath: LM nl, LAD 40p, 74m, D1 70, D2 80, RI min irregs, LCX 100ost, RCA 100p, 1101m/d, RPDA fills via collats from LAD, RPAV small-->Initial conservative Rx in setting of GIB w/ plan for PCI LAD if H/H stable on ASA/Plavix;  d. 12/04/16 s/p PTCA and DES to mLAD; e. 09/2018 MV: EF 54%, no ischemia.  . Carotid disease, bilateral ([place])    a. 04/2017 U/S: RICA 62-94, RCCA >76, LICA 54-65.  Marland Kitchen Depression    situational   . Diastolic dysfunction    a. 10/2016 Echo: EF 55-60%, no rwma, Gr2 DD, triv MR, mildly dil LA; b. 07/2017 Echo: EF 60-65%, Gr1 DD. No rwma. AoV leaflet thickening (not felt to be SBE upon further review). Mild MR. Mildly dil LA.  Marland Kitchen ESRD on dialysis [facility])    "[place]; [address]; TTS usually; going to [facility] in [place], [place] right now while staying w/daughter" (12/04/2016)  . GERD (gastroesophageal reflux disease)   . GIB (gastrointestinal bleeding) 10/2011   a. 10/2016 3u PRBCs - EGD/Colonoscopy: angiodysplasia w/ diverticulosis. No apparent bleeding. Polypecotmy->tubular adenomas.  . Gout   . Headache    no  migraines for years  . Heart murmur   . High cholesterol   . History of blood transfusion 10/18/2016   "related to anemia" (12/04/2016)  . History of kidney stones    years ago  . Hypertension   . Iron deficiency anemia   . Myocardial infarction ([place]) 10/18/2016  . Old MI (myocardial infarction)    "found evidence of this on 10/18/2016"  . PAF (paroxysmal atrial fibrillation) ([place])   . Peripheral vascular disease ([place])     BLE  . Prostate cancer ([place])    a. s/p seed implantation.  . Tobacco abuse     Past Surgical History:  Procedure Laterality Date  . AV FISTULA PLACEMENT Right 03/28/2017   Procedure: ARTERIOVENOUS (AV) BRACHIOCEPHALIC FISTULA CREATION;  Surgeon: Angelia Mould, MD;  Location: [place];  Service: Vascular;  Laterality: Right;  . CARDIAC CATHETERIZATION  10/2016  . CHOLECYSTECTOMY N/A 11/03/2019   Procedure: LAPAROSCOPIC CHOLECYSTECTOMY;  Surgeon: Clovis Riley, MD;  Location: [place];  Service: General;  Laterality: N/A;  . COLONOSCOPY WITH PROPOFOL N/A 10/21/2016   Procedure: COLONOSCOPY WITH PROPOFOL;  Surgeon: Gatha Mayer, MD;  Location: [place];  Service: Endoscopy;  Laterality: N/A;  . CORONARY ANGIOPLASTY WITH STENT PLACEMENT  12/04/2016   "1 stent"  . CORONARY STENT INTERVENTION N/A 12/04/2016   Procedure: CORONARY STENT INTERVENTION;  Surgeon: Wellington Hampshire, MD;  Location: [place] [facility];  Service: Cardiovascular;  Laterality: N/A;  . ESOPHAGOGASTRODUODENOSCOPY N/A 10/21/2016   Procedure: ESOPHAGOGASTRODUODENOSCOPY (EGD);  Surgeon: Gatha Mayer, MD;  Location[facility] ENDOSCOPY;  Service: Endoscopy;  Laterality: N/A;  . INSERTION OF DIALYSIS CATHETER Right 10/22/2016   Procedure: INSERTION OF TUNNELED DIALYSIS CATHETER;  Surgeon: Elam Dutch, MD;  Location: [place];  Service: Vascular;  Laterality: Right;  . INTRAOPERATIVE CHOLANGIOGRAM N/A 11/03/2019   Procedure: ATTEMPTED INTRAOPERATIVE CHOLANGIOGRAM;  Surgeon: Clovis Riley, MD;  Location: [place];  Service: General;  Laterality: N/A;  . LEFT HEART CATH AND CORONARY ANGIOGRAPHY N/A 10/23/2016   Procedure: LEFT HEART CATH AND CORONARY ANGIOGRAPHY;  Surgeon: Wellington Hampshire, MD;  Location: [place] [facility];  Service: Cardiovascular;  Laterality: N/A;  . LIGATION OF ARTERIOVENOUS  FISTULA Right 05/12/2017   Procedure: BANDING OF RIGHT UPPER ARM ARTERIOVENOUS  FISTULA USING 6MM X 10CM GORE TEX VASCULAR GRAFT;   Surgeon: Angelia Mould, MD;  Location: [place];  Service: Vascular;  Laterality: Right;  . LIGATION OF ARTERIOVENOUS  FISTULA Right 07/03/2017   Procedure: LIGATION OF ARTERIOVENOUS  FISTULA RIGHT ARM;  Surgeon: Angelia Mould, MD;  Location: [place];  Service: Vascular;  Laterality: Right;  . TONSILLECTOMY       Current Outpatient Medications  Medication Sig Dispense Refill  . acetaminophen (TYLENOL) 325 MG tablet Take 2 tablets (650 mg total) by mouth every 6 (six) hours as needed for mild pain.    Marland Kitchen allopurinol (ZYLOPRIM) 100 MG tablet Take 1 tablet (100 mg total) by mouth daily. 30 tablet 0  . atorvastatin (LIPITOR) 80 MG tablet TAKE 1 TABLET(80 MG) BY MOUTH DAILY AT 6 PM 90 tablet 0  . b complex vitamins tablet Take 1 tablet by mouth at bedtime.     . calcitRIOL (ROCALTROL) 0.25 MCG capsule Take 1 capsule (0.25 mcg total) by mouth Every Tuesday,Thursday,and Saturday with dialysis. 30 capsule 0  . clopidogrel (PLAVIX) 75 MG tablet Take 1 tablet (75 mg total) by mouth daily. 90 tablet 0  . ezetimibe (ZETIA) 10 MG tablet  Take 1 tablet (10 mg total) by mouth daily. 90 tablet 3  . ferric citrate (AURYXIA) 1 GM 210 MG(Fe) tablet Take 420 mg by mouth 2 (two) times daily.     . isosorbide mononitrate (IMDUR) 30 MG 24 hr tablet Take 1 tablet (30 mg) by mouth once daily in the evening    . midodrine (PROAMATINE) 10 MG tablet TAKE 1 TABLET BY MOUTH TWO TIMES DAILY AS NEEDED(TWICE DAILY ON HEMODIALYSIS DAYS). 180 tablet 0  . nitroGLYCERIN (NITROSTAT) 0.4 MG SL tablet Place 1 tablet (0.4 mg total) under the tongue every 5 (five) minutes as needed for chest pain. 30 tablet 0  . pantoprazole (PROTONIX) 40 MG tablet Take 40 mg by mouth 2 (two) times daily.     . tamsulosin (FLOMAX) 0.4 MG CAPS capsule Take 0.4 mg by mouth daily.     No current facility-administered medications for this visit.    Allergies:   Patient has no known allergies.    Social History:  The patient  reports that  he quit smoking about 3 years ago. His smoking use included cigarettes. He has a 25.00 pack-year smoking history. He has never used smokeless tobacco. He reports that he does not drink alcohol and does not use drugs.   Family History:  The patient's family history includes Cancer in his brother, father, mother, and sister; Diabetes in his brother and father; Heart attack in his father; Hypertension in his brother, daughter, and father.    ROS:  Please see the history of present illness.   Otherwise, review of systems are positive for none.   All other systems are reviewed and negative.    PHYSICAL EXAM: VS:  BP (!) 106/52 (BP Location: Right Arm, Patient Position: Sitting, Cuff Size: Normal)   Pulse (!) 55   Ht 5\' 6"  (1.676 m)   Wt 175 lb (79.4 kg)   SpO2 97%   BMI 28.25 kg/m  , BMI Body mass index is 28.25 kg/m. GEN: Well nourished, well developed, in no acute distress  HEENT: normal  Neck: no JVD, or masses.  Bilateral carotid bruits Cardiac: RRR; no  rubs, or gallops,no edema . 2/6 systolic ejection murmur at the aortic area Respiratory:  clear to auscultation bilaterally, normal work of breathing GI: soft, nontender, nondistended, + BS MS: no deformity or atrophy  Skin: warm and dry, no rash Neuro:  Strength and sensation are intact Psych: euthymic mood, full affect   EKG:  EKG is ordered today. The ekg ordered today demonstrates sinus bradycardia with first-degree AV block.    Recent Labs: 11/03/2019: ALT 51 11/05/2019: BUN 21; Creatinine, Ser 5.93; Hemoglobin 10.2; Platelets 167; Potassium 3.9; Sodium 135    Lipid Panel    Component Value Date/Time   CHOL 131 05/12/2019 1134   CHOL 198 03/05/2019 1526   TRIG 161 (H) 05/12/2019 1134   HDL 31 (L) 05/12/2019 1134   HDL 33 (L) 03/05/2019 1526   CHOLHDL 4.2 05/12/2019 1134   VLDL 32 05/12/2019 1134   LDLCALC 68 05/12/2019 1134   LDLCALC 125 (H) 03/05/2019 1526      Wt Readings from Last 3 Encounters:  04/05/20  175 lb (79.4 kg)  01/03/20 175 lb (79.4 kg)  11/04/19 169 lb 15.6 oz (77.1 kg)     No flowsheet data found.    ASSESSMENT AND PLAN:  1.  Coronary artery disease involving native coronary arteries with other forms of angina: The patient is status post angioplasty and drug-eluting stent placement  to the mid LAD in September 2018.  He is tolerating Plavix monotherapy without recurrent GI bleed.  He has stable chronic angina which happens mainly during episodes of hypotension in dialysis.  No significant change in symptoms.  Not able to uptitrate antianginal therapy due to chronic hypotension.  2.  Essential hypertension: Blood pressure is controlled and if anything blood pressure drops during dialysis and he uses midodrine.  3. Hyperlipidemia: Continue high dose atorvastatin and Zetia with a target LDL of less than 70.  4. Peripheral arterial disease: Continue medical therapy.  The patient denies claudication.    6.  Moderate bilateral carotid disease: Stable moderate disease on recent carotid Doppler.  Study can be repeated in 1 year.   Disposition:   FU with me in 6 months.  Signed,  Roger Sacramento, MD  04/05/2020 2:32 PM    [facility]

## 2020-04-06 ENCOUNTER — Other Ambulatory Visit: Payer: Self-pay

## 2020-04-06 DIAGNOSIS — E876 Hypokalemia: Secondary | ICD-10-CM | POA: Diagnosis not present

## 2020-04-06 DIAGNOSIS — N186 End stage renal disease: Secondary | ICD-10-CM | POA: Diagnosis not present

## 2020-04-06 DIAGNOSIS — D509 Iron deficiency anemia, unspecified: Secondary | ICD-10-CM | POA: Diagnosis not present

## 2020-04-06 DIAGNOSIS — N2581 Secondary hyperparathyroidism of renal origin: Secondary | ICD-10-CM | POA: Diagnosis not present

## 2020-04-06 DIAGNOSIS — Z992 Dependence on renal dialysis: Secondary | ICD-10-CM | POA: Diagnosis not present

## 2020-04-06 DIAGNOSIS — D631 Anemia in chronic kidney disease: Secondary | ICD-10-CM | POA: Diagnosis not present

## 2020-04-06 DIAGNOSIS — D689 Coagulation defect, unspecified: Secondary | ICD-10-CM | POA: Diagnosis not present

## 2020-04-06 MED ORDER — EZETIMIBE 10 MG PO TABS: 10.0000 mg | ORAL_TABLET | Freq: Every day | ORAL | 1 refills | Status: DC

## 2020-04-07 ENCOUNTER — Ambulatory Visit: Payer: Medicare Other | Admitting: Nurse Practitioner

## 2020-04-08 DIAGNOSIS — D689 Coagulation defect, unspecified: Secondary | ICD-10-CM | POA: Diagnosis not present

## 2020-04-08 DIAGNOSIS — Z992 Dependence on renal dialysis: Secondary | ICD-10-CM | POA: Diagnosis not present

## 2020-04-08 DIAGNOSIS — D509 Iron deficiency anemia, unspecified: Secondary | ICD-10-CM | POA: Diagnosis not present

## 2020-04-08 DIAGNOSIS — D631 Anemia in chronic kidney disease: Secondary | ICD-10-CM | POA: Diagnosis not present

## 2020-04-08 DIAGNOSIS — E876 Hypokalemia: Secondary | ICD-10-CM | POA: Diagnosis not present

## 2020-04-08 DIAGNOSIS — N186 End stage renal disease: Secondary | ICD-10-CM | POA: Diagnosis not present

## 2020-04-08 DIAGNOSIS — N2581 Secondary hyperparathyroidism of renal origin: Secondary | ICD-10-CM | POA: Diagnosis not present

## 2020-04-11 DIAGNOSIS — D689 Coagulation defect, unspecified: Secondary | ICD-10-CM | POA: Diagnosis not present

## 2020-04-11 DIAGNOSIS — D509 Iron deficiency anemia, unspecified: Secondary | ICD-10-CM | POA: Diagnosis not present

## 2020-04-11 DIAGNOSIS — N2581 Secondary hyperparathyroidism of renal origin: Secondary | ICD-10-CM | POA: Diagnosis not present

## 2020-04-11 DIAGNOSIS — D631 Anemia in chronic kidney disease: Secondary | ICD-10-CM | POA: Diagnosis not present

## 2020-04-11 DIAGNOSIS — N186 End stage renal disease: Secondary | ICD-10-CM | POA: Diagnosis not present

## 2020-04-11 DIAGNOSIS — E876 Hypokalemia: Secondary | ICD-10-CM | POA: Diagnosis not present

## 2020-04-11 DIAGNOSIS — Z992 Dependence on renal dialysis: Secondary | ICD-10-CM | POA: Diagnosis not present

## 2020-04-13 DIAGNOSIS — E876 Hypokalemia: Secondary | ICD-10-CM | POA: Diagnosis not present

## 2020-04-13 DIAGNOSIS — N186 End stage renal disease: Secondary | ICD-10-CM | POA: Diagnosis not present

## 2020-04-13 DIAGNOSIS — D631 Anemia in chronic kidney disease: Secondary | ICD-10-CM | POA: Diagnosis not present

## 2020-04-13 DIAGNOSIS — N2581 Secondary hyperparathyroidism of renal origin: Secondary | ICD-10-CM | POA: Diagnosis not present

## 2020-04-13 DIAGNOSIS — D509 Iron deficiency anemia, unspecified: Secondary | ICD-10-CM | POA: Diagnosis not present

## 2020-04-13 DIAGNOSIS — D689 Coagulation defect, unspecified: Secondary | ICD-10-CM | POA: Diagnosis not present

## 2020-04-13 DIAGNOSIS — Z992 Dependence on renal dialysis: Secondary | ICD-10-CM | POA: Diagnosis not present

## 2020-04-15 DIAGNOSIS — D509 Iron deficiency anemia, unspecified: Secondary | ICD-10-CM | POA: Diagnosis not present

## 2020-04-15 DIAGNOSIS — N2581 Secondary hyperparathyroidism of renal origin: Secondary | ICD-10-CM | POA: Diagnosis not present

## 2020-04-15 DIAGNOSIS — Z992 Dependence on renal dialysis: Secondary | ICD-10-CM | POA: Diagnosis not present

## 2020-04-15 DIAGNOSIS — D689 Coagulation defect, unspecified: Secondary | ICD-10-CM | POA: Diagnosis not present

## 2020-04-15 DIAGNOSIS — N186 End stage renal disease: Secondary | ICD-10-CM | POA: Diagnosis not present

## 2020-04-15 DIAGNOSIS — E876 Hypokalemia: Secondary | ICD-10-CM | POA: Diagnosis not present

## 2020-04-15 DIAGNOSIS — D631 Anemia in chronic kidney disease: Secondary | ICD-10-CM | POA: Diagnosis not present

## 2020-04-17 DIAGNOSIS — Z992 Dependence on renal dialysis: Secondary | ICD-10-CM | POA: Diagnosis not present

## 2020-04-17 DIAGNOSIS — I129 Hypertensive chronic kidney disease with stage 1 through stage 4 chronic kidney disease, or unspecified chronic kidney disease: Secondary | ICD-10-CM | POA: Diagnosis not present

## 2020-04-17 DIAGNOSIS — N186 End stage renal disease: Secondary | ICD-10-CM | POA: Diagnosis not present

## 2020-04-18 DIAGNOSIS — N186 End stage renal disease: Secondary | ICD-10-CM | POA: Diagnosis not present

## 2020-04-18 DIAGNOSIS — E876 Hypokalemia: Secondary | ICD-10-CM | POA: Diagnosis not present

## 2020-04-18 DIAGNOSIS — D689 Coagulation defect, unspecified: Secondary | ICD-10-CM | POA: Diagnosis not present

## 2020-04-18 DIAGNOSIS — D509 Iron deficiency anemia, unspecified: Secondary | ICD-10-CM | POA: Diagnosis not present

## 2020-04-18 DIAGNOSIS — Z992 Dependence on renal dialysis: Secondary | ICD-10-CM | POA: Diagnosis not present

## 2020-04-18 DIAGNOSIS — D631 Anemia in chronic kidney disease: Secondary | ICD-10-CM | POA: Diagnosis not present

## 2020-04-18 DIAGNOSIS — N2581 Secondary hyperparathyroidism of renal origin: Secondary | ICD-10-CM | POA: Diagnosis not present

## 2020-04-20 DIAGNOSIS — D631 Anemia in chronic kidney disease: Secondary | ICD-10-CM | POA: Diagnosis not present

## 2020-04-20 DIAGNOSIS — D509 Iron deficiency anemia, unspecified: Secondary | ICD-10-CM | POA: Diagnosis not present

## 2020-04-20 DIAGNOSIS — Z992 Dependence on renal dialysis: Secondary | ICD-10-CM | POA: Diagnosis not present

## 2020-04-20 DIAGNOSIS — D689 Coagulation defect, unspecified: Secondary | ICD-10-CM | POA: Diagnosis not present

## 2020-04-20 DIAGNOSIS — N186 End stage renal disease: Secondary | ICD-10-CM | POA: Diagnosis not present

## 2020-04-20 DIAGNOSIS — E876 Hypokalemia: Secondary | ICD-10-CM | POA: Diagnosis not present

## 2020-04-20 DIAGNOSIS — N2581 Secondary hyperparathyroidism of renal origin: Secondary | ICD-10-CM | POA: Diagnosis not present

## 2020-04-22 DIAGNOSIS — Z992 Dependence on renal dialysis: Secondary | ICD-10-CM | POA: Diagnosis not present

## 2020-04-22 DIAGNOSIS — D509 Iron deficiency anemia, unspecified: Secondary | ICD-10-CM | POA: Diagnosis not present

## 2020-04-22 DIAGNOSIS — E876 Hypokalemia: Secondary | ICD-10-CM | POA: Diagnosis not present

## 2020-04-22 DIAGNOSIS — N186 End stage renal disease: Secondary | ICD-10-CM | POA: Diagnosis not present

## 2020-04-22 DIAGNOSIS — D689 Coagulation defect, unspecified: Secondary | ICD-10-CM | POA: Diagnosis not present

## 2020-04-22 DIAGNOSIS — N2581 Secondary hyperparathyroidism of renal origin: Secondary | ICD-10-CM | POA: Diagnosis not present

## 2020-04-22 DIAGNOSIS — D631 Anemia in chronic kidney disease: Secondary | ICD-10-CM | POA: Diagnosis not present

## 2020-04-25 DIAGNOSIS — E876 Hypokalemia: Secondary | ICD-10-CM | POA: Diagnosis not present

## 2020-04-25 DIAGNOSIS — N2581 Secondary hyperparathyroidism of renal origin: Secondary | ICD-10-CM | POA: Diagnosis not present

## 2020-04-25 DIAGNOSIS — D689 Coagulation defect, unspecified: Secondary | ICD-10-CM | POA: Diagnosis not present

## 2020-04-25 DIAGNOSIS — D509 Iron deficiency anemia, unspecified: Secondary | ICD-10-CM | POA: Diagnosis not present

## 2020-04-25 DIAGNOSIS — D631 Anemia in chronic kidney disease: Secondary | ICD-10-CM | POA: Diagnosis not present

## 2020-04-25 DIAGNOSIS — Z992 Dependence on renal dialysis: Secondary | ICD-10-CM | POA: Diagnosis not present

## 2020-04-25 DIAGNOSIS — N186 End stage renal disease: Secondary | ICD-10-CM | POA: Diagnosis not present

## 2020-04-27 DIAGNOSIS — N186 End stage renal disease: Secondary | ICD-10-CM | POA: Diagnosis not present

## 2020-04-27 DIAGNOSIS — Z992 Dependence on renal dialysis: Secondary | ICD-10-CM | POA: Diagnosis not present

## 2020-04-27 DIAGNOSIS — N2581 Secondary hyperparathyroidism of renal origin: Secondary | ICD-10-CM | POA: Diagnosis not present

## 2020-04-27 DIAGNOSIS — D689 Coagulation defect, unspecified: Secondary | ICD-10-CM | POA: Diagnosis not present

## 2020-04-27 DIAGNOSIS — D631 Anemia in chronic kidney disease: Secondary | ICD-10-CM | POA: Diagnosis not present

## 2020-04-27 DIAGNOSIS — D509 Iron deficiency anemia, unspecified: Secondary | ICD-10-CM | POA: Diagnosis not present

## 2020-04-27 DIAGNOSIS — E876 Hypokalemia: Secondary | ICD-10-CM | POA: Diagnosis not present

## 2020-04-29 DIAGNOSIS — Z992 Dependence on renal dialysis: Secondary | ICD-10-CM | POA: Diagnosis not present

## 2020-04-29 DIAGNOSIS — D631 Anemia in chronic kidney disease: Secondary | ICD-10-CM | POA: Diagnosis not present

## 2020-04-29 DIAGNOSIS — E876 Hypokalemia: Secondary | ICD-10-CM | POA: Diagnosis not present

## 2020-04-29 DIAGNOSIS — D509 Iron deficiency anemia, unspecified: Secondary | ICD-10-CM | POA: Diagnosis not present

## 2020-04-29 DIAGNOSIS — D689 Coagulation defect, unspecified: Secondary | ICD-10-CM | POA: Diagnosis not present

## 2020-04-29 DIAGNOSIS — N186 End stage renal disease: Secondary | ICD-10-CM | POA: Diagnosis not present

## 2020-04-29 DIAGNOSIS — N2581 Secondary hyperparathyroidism of renal origin: Secondary | ICD-10-CM | POA: Diagnosis not present

## 2020-05-02 DIAGNOSIS — E876 Hypokalemia: Secondary | ICD-10-CM | POA: Diagnosis not present

## 2020-05-02 DIAGNOSIS — N2581 Secondary hyperparathyroidism of renal origin: Secondary | ICD-10-CM | POA: Diagnosis not present

## 2020-05-02 DIAGNOSIS — D689 Coagulation defect, unspecified: Secondary | ICD-10-CM | POA: Diagnosis not present

## 2020-05-02 DIAGNOSIS — D509 Iron deficiency anemia, unspecified: Secondary | ICD-10-CM | POA: Diagnosis not present

## 2020-05-02 DIAGNOSIS — Z992 Dependence on renal dialysis: Secondary | ICD-10-CM | POA: Diagnosis not present

## 2020-05-02 DIAGNOSIS — N186 End stage renal disease: Secondary | ICD-10-CM | POA: Diagnosis not present

## 2020-05-02 DIAGNOSIS — D631 Anemia in chronic kidney disease: Secondary | ICD-10-CM | POA: Diagnosis not present

## 2020-05-04 DIAGNOSIS — D689 Coagulation defect, unspecified: Secondary | ICD-10-CM | POA: Diagnosis not present

## 2020-05-04 DIAGNOSIS — D509 Iron deficiency anemia, unspecified: Secondary | ICD-10-CM | POA: Diagnosis not present

## 2020-05-04 DIAGNOSIS — E876 Hypokalemia: Secondary | ICD-10-CM | POA: Diagnosis not present

## 2020-05-04 DIAGNOSIS — N186 End stage renal disease: Secondary | ICD-10-CM | POA: Diagnosis not present

## 2020-05-04 DIAGNOSIS — N2581 Secondary hyperparathyroidism of renal origin: Secondary | ICD-10-CM | POA: Diagnosis not present

## 2020-05-04 DIAGNOSIS — Z992 Dependence on renal dialysis: Secondary | ICD-10-CM | POA: Diagnosis not present

## 2020-05-04 DIAGNOSIS — D631 Anemia in chronic kidney disease: Secondary | ICD-10-CM | POA: Diagnosis not present

## 2020-05-06 DIAGNOSIS — N186 End stage renal disease: Secondary | ICD-10-CM | POA: Diagnosis not present

## 2020-05-06 DIAGNOSIS — D689 Coagulation defect, unspecified: Secondary | ICD-10-CM | POA: Diagnosis not present

## 2020-05-06 DIAGNOSIS — Z992 Dependence on renal dialysis: Secondary | ICD-10-CM | POA: Diagnosis not present

## 2020-05-06 DIAGNOSIS — D631 Anemia in chronic kidney disease: Secondary | ICD-10-CM | POA: Diagnosis not present

## 2020-05-06 DIAGNOSIS — N2581 Secondary hyperparathyroidism of renal origin: Secondary | ICD-10-CM | POA: Diagnosis not present

## 2020-05-06 DIAGNOSIS — D509 Iron deficiency anemia, unspecified: Secondary | ICD-10-CM | POA: Diagnosis not present

## 2020-05-06 DIAGNOSIS — E876 Hypokalemia: Secondary | ICD-10-CM | POA: Diagnosis not present

## 2020-05-09 DIAGNOSIS — D689 Coagulation defect, unspecified: Secondary | ICD-10-CM | POA: Diagnosis not present

## 2020-05-09 DIAGNOSIS — D509 Iron deficiency anemia, unspecified: Secondary | ICD-10-CM | POA: Diagnosis not present

## 2020-05-09 DIAGNOSIS — Z992 Dependence on renal dialysis: Secondary | ICD-10-CM | POA: Diagnosis not present

## 2020-05-09 DIAGNOSIS — N186 End stage renal disease: Secondary | ICD-10-CM | POA: Diagnosis not present

## 2020-05-09 DIAGNOSIS — N2581 Secondary hyperparathyroidism of renal origin: Secondary | ICD-10-CM | POA: Diagnosis not present

## 2020-05-09 DIAGNOSIS — E876 Hypokalemia: Secondary | ICD-10-CM | POA: Diagnosis not present

## 2020-05-09 DIAGNOSIS — D631 Anemia in chronic kidney disease: Secondary | ICD-10-CM | POA: Diagnosis not present

## 2020-05-11 DIAGNOSIS — N2581 Secondary hyperparathyroidism of renal origin: Secondary | ICD-10-CM | POA: Diagnosis not present

## 2020-05-11 DIAGNOSIS — D509 Iron deficiency anemia, unspecified: Secondary | ICD-10-CM | POA: Diagnosis not present

## 2020-05-11 DIAGNOSIS — D689 Coagulation defect, unspecified: Secondary | ICD-10-CM | POA: Diagnosis not present

## 2020-05-11 DIAGNOSIS — Z992 Dependence on renal dialysis: Secondary | ICD-10-CM | POA: Diagnosis not present

## 2020-05-11 DIAGNOSIS — E876 Hypokalemia: Secondary | ICD-10-CM | POA: Diagnosis not present

## 2020-05-11 DIAGNOSIS — D631 Anemia in chronic kidney disease: Secondary | ICD-10-CM | POA: Diagnosis not present

## 2020-05-11 DIAGNOSIS — N186 End stage renal disease: Secondary | ICD-10-CM | POA: Diagnosis not present

## 2020-05-13 DIAGNOSIS — D509 Iron deficiency anemia, unspecified: Secondary | ICD-10-CM | POA: Diagnosis not present

## 2020-05-13 DIAGNOSIS — D689 Coagulation defect, unspecified: Secondary | ICD-10-CM | POA: Diagnosis not present

## 2020-05-13 DIAGNOSIS — Z992 Dependence on renal dialysis: Secondary | ICD-10-CM | POA: Diagnosis not present

## 2020-05-13 DIAGNOSIS — N2581 Secondary hyperparathyroidism of renal origin: Secondary | ICD-10-CM | POA: Diagnosis not present

## 2020-05-13 DIAGNOSIS — D631 Anemia in chronic kidney disease: Secondary | ICD-10-CM | POA: Diagnosis not present

## 2020-05-13 DIAGNOSIS — E876 Hypokalemia: Secondary | ICD-10-CM | POA: Diagnosis not present

## 2020-05-13 DIAGNOSIS — N186 End stage renal disease: Secondary | ICD-10-CM | POA: Diagnosis not present

## 2020-05-15 DIAGNOSIS — I129 Hypertensive chronic kidney disease with stage 1 through stage 4 chronic kidney disease, or unspecified chronic kidney disease: Secondary | ICD-10-CM | POA: Diagnosis not present

## 2020-05-15 DIAGNOSIS — Z992 Dependence on renal dialysis: Secondary | ICD-10-CM | POA: Diagnosis not present

## 2020-05-15 DIAGNOSIS — N186 End stage renal disease: Secondary | ICD-10-CM | POA: Diagnosis not present

## 2020-05-16 ENCOUNTER — Other Ambulatory Visit: Payer: Self-pay | Admitting: Gastroenterology

## 2020-05-16 DIAGNOSIS — D689 Coagulation defect, unspecified: Secondary | ICD-10-CM | POA: Diagnosis not present

## 2020-05-16 DIAGNOSIS — E876 Hypokalemia: Secondary | ICD-10-CM | POA: Diagnosis not present

## 2020-05-16 DIAGNOSIS — D509 Iron deficiency anemia, unspecified: Secondary | ICD-10-CM | POA: Diagnosis not present

## 2020-05-16 DIAGNOSIS — Z992 Dependence on renal dialysis: Secondary | ICD-10-CM | POA: Diagnosis not present

## 2020-05-16 DIAGNOSIS — N186 End stage renal disease: Secondary | ICD-10-CM | POA: Diagnosis not present

## 2020-05-16 DIAGNOSIS — N281 Cyst of kidney, acquired: Secondary | ICD-10-CM

## 2020-05-16 DIAGNOSIS — D7389 Other diseases of spleen: Secondary | ICD-10-CM

## 2020-05-16 DIAGNOSIS — N2581 Secondary hyperparathyroidism of renal origin: Secondary | ICD-10-CM | POA: Diagnosis not present

## 2020-05-18 DIAGNOSIS — E876 Hypokalemia: Secondary | ICD-10-CM | POA: Diagnosis not present

## 2020-05-18 DIAGNOSIS — N2581 Secondary hyperparathyroidism of renal origin: Secondary | ICD-10-CM | POA: Diagnosis not present

## 2020-05-18 DIAGNOSIS — D689 Coagulation defect, unspecified: Secondary | ICD-10-CM | POA: Diagnosis not present

## 2020-05-18 DIAGNOSIS — Z992 Dependence on renal dialysis: Secondary | ICD-10-CM | POA: Diagnosis not present

## 2020-05-18 DIAGNOSIS — D509 Iron deficiency anemia, unspecified: Secondary | ICD-10-CM | POA: Diagnosis not present

## 2020-05-18 DIAGNOSIS — N186 End stage renal disease: Secondary | ICD-10-CM | POA: Diagnosis not present

## 2020-05-20 DIAGNOSIS — D509 Iron deficiency anemia, unspecified: Secondary | ICD-10-CM | POA: Diagnosis not present

## 2020-05-20 DIAGNOSIS — N186 End stage renal disease: Secondary | ICD-10-CM | POA: Diagnosis not present

## 2020-05-20 DIAGNOSIS — N2581 Secondary hyperparathyroidism of renal origin: Secondary | ICD-10-CM | POA: Diagnosis not present

## 2020-05-20 DIAGNOSIS — Z992 Dependence on renal dialysis: Secondary | ICD-10-CM | POA: Diagnosis not present

## 2020-05-20 DIAGNOSIS — E876 Hypokalemia: Secondary | ICD-10-CM | POA: Diagnosis not present

## 2020-05-20 DIAGNOSIS — D689 Coagulation defect, unspecified: Secondary | ICD-10-CM | POA: Diagnosis not present

## 2020-05-23 DIAGNOSIS — D631 Anemia in chronic kidney disease: Secondary | ICD-10-CM | POA: Diagnosis not present

## 2020-05-23 DIAGNOSIS — N2581 Secondary hyperparathyroidism of renal origin: Secondary | ICD-10-CM | POA: Diagnosis not present

## 2020-05-23 DIAGNOSIS — E876 Hypokalemia: Secondary | ICD-10-CM | POA: Diagnosis not present

## 2020-05-23 DIAGNOSIS — Z992 Dependence on renal dialysis: Secondary | ICD-10-CM | POA: Diagnosis not present

## 2020-05-23 DIAGNOSIS — D509 Iron deficiency anemia, unspecified: Secondary | ICD-10-CM | POA: Diagnosis not present

## 2020-05-23 DIAGNOSIS — D689 Coagulation defect, unspecified: Secondary | ICD-10-CM | POA: Diagnosis not present

## 2020-05-23 DIAGNOSIS — N186 End stage renal disease: Secondary | ICD-10-CM | POA: Diagnosis not present

## 2020-05-25 DIAGNOSIS — E876 Hypokalemia: Secondary | ICD-10-CM | POA: Diagnosis not present

## 2020-05-25 DIAGNOSIS — D689 Coagulation defect, unspecified: Secondary | ICD-10-CM | POA: Diagnosis not present

## 2020-05-25 DIAGNOSIS — N2581 Secondary hyperparathyroidism of renal origin: Secondary | ICD-10-CM | POA: Diagnosis not present

## 2020-05-25 DIAGNOSIS — Z992 Dependence on renal dialysis: Secondary | ICD-10-CM | POA: Diagnosis not present

## 2020-05-25 DIAGNOSIS — D631 Anemia in chronic kidney disease: Secondary | ICD-10-CM | POA: Diagnosis not present

## 2020-05-25 DIAGNOSIS — D509 Iron deficiency anemia, unspecified: Secondary | ICD-10-CM | POA: Diagnosis not present

## 2020-05-25 DIAGNOSIS — N186 End stage renal disease: Secondary | ICD-10-CM | POA: Diagnosis not present

## 2020-05-26 ENCOUNTER — Encounter: Payer: Self-pay | Admitting: Cardiovascular Disease

## 2020-05-26 DIAGNOSIS — Z79899 Other long term (current) drug therapy: Secondary | ICD-10-CM | POA: Diagnosis not present

## 2020-05-26 DIAGNOSIS — I10 Essential (primary) hypertension: Secondary | ICD-10-CM | POA: Diagnosis not present

## 2020-05-26 DIAGNOSIS — K219 Gastro-esophageal reflux disease without esophagitis: Secondary | ICD-10-CM | POA: Diagnosis not present

## 2020-05-26 DIAGNOSIS — Z0001 Encounter for general adult medical examination with abnormal findings: Secondary | ICD-10-CM | POA: Diagnosis not present

## 2020-05-26 DIAGNOSIS — N186 End stage renal disease: Secondary | ICD-10-CM | POA: Diagnosis not present

## 2020-05-26 DIAGNOSIS — Z131 Encounter for screening for diabetes mellitus: Secondary | ICD-10-CM | POA: Diagnosis not present

## 2020-05-26 DIAGNOSIS — Z23 Encounter for immunization: Secondary | ICD-10-CM | POA: Diagnosis not present

## 2020-05-26 DIAGNOSIS — E7800 Pure hypercholesterolemia, unspecified: Secondary | ICD-10-CM | POA: Diagnosis not present

## 2020-05-26 DIAGNOSIS — R413 Other amnesia: Secondary | ICD-10-CM | POA: Diagnosis not present

## 2020-05-26 DIAGNOSIS — I25118 Atherosclerotic heart disease of native coronary artery with other forms of angina pectoris: Secondary | ICD-10-CM | POA: Diagnosis not present

## 2020-05-27 DIAGNOSIS — N186 End stage renal disease: Secondary | ICD-10-CM | POA: Diagnosis not present

## 2020-05-27 DIAGNOSIS — D689 Coagulation defect, unspecified: Secondary | ICD-10-CM | POA: Diagnosis not present

## 2020-05-27 DIAGNOSIS — E876 Hypokalemia: Secondary | ICD-10-CM | POA: Diagnosis not present

## 2020-05-27 DIAGNOSIS — N2581 Secondary hyperparathyroidism of renal origin: Secondary | ICD-10-CM | POA: Diagnosis not present

## 2020-05-27 DIAGNOSIS — D631 Anemia in chronic kidney disease: Secondary | ICD-10-CM | POA: Diagnosis not present

## 2020-05-27 DIAGNOSIS — Z992 Dependence on renal dialysis: Secondary | ICD-10-CM | POA: Diagnosis not present

## 2020-05-27 DIAGNOSIS — D509 Iron deficiency anemia, unspecified: Secondary | ICD-10-CM | POA: Diagnosis not present

## 2020-05-30 DIAGNOSIS — D689 Coagulation defect, unspecified: Secondary | ICD-10-CM | POA: Diagnosis not present

## 2020-05-30 DIAGNOSIS — Z992 Dependence on renal dialysis: Secondary | ICD-10-CM | POA: Diagnosis not present

## 2020-05-30 DIAGNOSIS — D509 Iron deficiency anemia, unspecified: Secondary | ICD-10-CM | POA: Diagnosis not present

## 2020-05-30 DIAGNOSIS — N2581 Secondary hyperparathyroidism of renal origin: Secondary | ICD-10-CM | POA: Diagnosis not present

## 2020-05-30 DIAGNOSIS — N186 End stage renal disease: Secondary | ICD-10-CM | POA: Diagnosis not present

## 2020-06-01 DIAGNOSIS — N2581 Secondary hyperparathyroidism of renal origin: Secondary | ICD-10-CM | POA: Diagnosis not present

## 2020-06-01 DIAGNOSIS — D509 Iron deficiency anemia, unspecified: Secondary | ICD-10-CM | POA: Diagnosis not present

## 2020-06-01 DIAGNOSIS — N186 End stage renal disease: Secondary | ICD-10-CM | POA: Diagnosis not present

## 2020-06-01 DIAGNOSIS — D689 Coagulation defect, unspecified: Secondary | ICD-10-CM | POA: Diagnosis not present

## 2020-06-01 DIAGNOSIS — Z992 Dependence on renal dialysis: Secondary | ICD-10-CM | POA: Diagnosis not present

## 2020-06-03 DIAGNOSIS — N2581 Secondary hyperparathyroidism of renal origin: Secondary | ICD-10-CM | POA: Diagnosis not present

## 2020-06-03 DIAGNOSIS — D509 Iron deficiency anemia, unspecified: Secondary | ICD-10-CM | POA: Diagnosis not present

## 2020-06-03 DIAGNOSIS — N186 End stage renal disease: Secondary | ICD-10-CM | POA: Diagnosis not present

## 2020-06-03 DIAGNOSIS — Z992 Dependence on renal dialysis: Secondary | ICD-10-CM | POA: Diagnosis not present

## 2020-06-03 DIAGNOSIS — D689 Coagulation defect, unspecified: Secondary | ICD-10-CM | POA: Diagnosis not present

## 2020-06-06 DIAGNOSIS — E876 Hypokalemia: Secondary | ICD-10-CM | POA: Diagnosis not present

## 2020-06-06 DIAGNOSIS — D509 Iron deficiency anemia, unspecified: Secondary | ICD-10-CM | POA: Diagnosis not present

## 2020-06-06 DIAGNOSIS — N2581 Secondary hyperparathyroidism of renal origin: Secondary | ICD-10-CM | POA: Diagnosis not present

## 2020-06-06 DIAGNOSIS — N186 End stage renal disease: Secondary | ICD-10-CM | POA: Diagnosis not present

## 2020-06-06 DIAGNOSIS — Z992 Dependence on renal dialysis: Secondary | ICD-10-CM | POA: Diagnosis not present

## 2020-06-06 DIAGNOSIS — D689 Coagulation defect, unspecified: Secondary | ICD-10-CM | POA: Diagnosis not present

## 2020-06-06 DIAGNOSIS — D631 Anemia in chronic kidney disease: Secondary | ICD-10-CM | POA: Diagnosis not present

## 2020-06-08 DIAGNOSIS — E876 Hypokalemia: Secondary | ICD-10-CM | POA: Diagnosis not present

## 2020-06-08 DIAGNOSIS — N2581 Secondary hyperparathyroidism of renal origin: Secondary | ICD-10-CM | POA: Diagnosis not present

## 2020-06-08 DIAGNOSIS — D689 Coagulation defect, unspecified: Secondary | ICD-10-CM | POA: Diagnosis not present

## 2020-06-08 DIAGNOSIS — D631 Anemia in chronic kidney disease: Secondary | ICD-10-CM | POA: Diagnosis not present

## 2020-06-08 DIAGNOSIS — Z992 Dependence on renal dialysis: Secondary | ICD-10-CM | POA: Diagnosis not present

## 2020-06-08 DIAGNOSIS — N186 End stage renal disease: Secondary | ICD-10-CM | POA: Diagnosis not present

## 2020-06-08 DIAGNOSIS — D509 Iron deficiency anemia, unspecified: Secondary | ICD-10-CM | POA: Diagnosis not present

## 2020-06-10 DIAGNOSIS — Z992 Dependence on renal dialysis: Secondary | ICD-10-CM | POA: Diagnosis not present

## 2020-06-10 DIAGNOSIS — D631 Anemia in chronic kidney disease: Secondary | ICD-10-CM | POA: Diagnosis not present

## 2020-06-10 DIAGNOSIS — D689 Coagulation defect, unspecified: Secondary | ICD-10-CM | POA: Diagnosis not present

## 2020-06-10 DIAGNOSIS — D509 Iron deficiency anemia, unspecified: Secondary | ICD-10-CM | POA: Diagnosis not present

## 2020-06-10 DIAGNOSIS — E876 Hypokalemia: Secondary | ICD-10-CM | POA: Diagnosis not present

## 2020-06-10 DIAGNOSIS — N2581 Secondary hyperparathyroidism of renal origin: Secondary | ICD-10-CM | POA: Diagnosis not present

## 2020-06-10 DIAGNOSIS — N186 End stage renal disease: Secondary | ICD-10-CM | POA: Diagnosis not present

## 2020-06-12 ENCOUNTER — Ambulatory Visit
Admission: RE | Admit: 2020-06-12 | Discharge: 2020-06-12 | Disposition: A | Discharge status: Home / Self Care | Payer: Medicare Other | Source: Ambulatory Visit | Attending: Gastroenterology | Admitting: Gastroenterology

## 2020-06-12 DIAGNOSIS — N186 End stage renal disease: Secondary | ICD-10-CM | POA: Diagnosis not present

## 2020-06-12 DIAGNOSIS — N261 Atrophy of kidney (terminal): Secondary | ICD-10-CM | POA: Diagnosis not present

## 2020-06-12 DIAGNOSIS — N281 Cyst of kidney, acquired: Secondary | ICD-10-CM

## 2020-06-12 DIAGNOSIS — D7389 Other diseases of spleen: Secondary | ICD-10-CM | POA: Diagnosis not present

## 2020-06-13 DIAGNOSIS — N2581 Secondary hyperparathyroidism of renal origin: Secondary | ICD-10-CM | POA: Diagnosis not present

## 2020-06-13 DIAGNOSIS — E876 Hypokalemia: Secondary | ICD-10-CM | POA: Diagnosis not present

## 2020-06-13 DIAGNOSIS — Z992 Dependence on renal dialysis: Secondary | ICD-10-CM | POA: Diagnosis not present

## 2020-06-13 DIAGNOSIS — N186 End stage renal disease: Secondary | ICD-10-CM | POA: Diagnosis not present

## 2020-06-13 DIAGNOSIS — D509 Iron deficiency anemia, unspecified: Secondary | ICD-10-CM | POA: Diagnosis not present

## 2020-06-13 DIAGNOSIS — D689 Coagulation defect, unspecified: Secondary | ICD-10-CM | POA: Diagnosis not present

## 2020-06-15 DIAGNOSIS — N186 End stage renal disease: Secondary | ICD-10-CM | POA: Diagnosis not present

## 2020-06-15 DIAGNOSIS — D509 Iron deficiency anemia, unspecified: Secondary | ICD-10-CM | POA: Diagnosis not present

## 2020-06-15 DIAGNOSIS — I129 Hypertensive chronic kidney disease with stage 1 through stage 4 chronic kidney disease, or unspecified chronic kidney disease: Secondary | ICD-10-CM | POA: Diagnosis not present

## 2020-06-15 DIAGNOSIS — Z992 Dependence on renal dialysis: Secondary | ICD-10-CM | POA: Diagnosis not present

## 2020-06-15 DIAGNOSIS — D689 Coagulation defect, unspecified: Secondary | ICD-10-CM | POA: Diagnosis not present

## 2020-06-15 DIAGNOSIS — E876 Hypokalemia: Secondary | ICD-10-CM | POA: Diagnosis not present

## 2020-06-15 DIAGNOSIS — N2581 Secondary hyperparathyroidism of renal origin: Secondary | ICD-10-CM | POA: Diagnosis not present

## 2020-06-17 DIAGNOSIS — Z992 Dependence on renal dialysis: Secondary | ICD-10-CM | POA: Diagnosis not present

## 2020-06-17 DIAGNOSIS — D689 Coagulation defect, unspecified: Secondary | ICD-10-CM | POA: Diagnosis not present

## 2020-06-17 DIAGNOSIS — N186 End stage renal disease: Secondary | ICD-10-CM | POA: Diagnosis not present

## 2020-06-17 DIAGNOSIS — N2581 Secondary hyperparathyroidism of renal origin: Secondary | ICD-10-CM | POA: Diagnosis not present

## 2020-06-19 DIAGNOSIS — K229 Disease of esophagus, unspecified: Secondary | ICD-10-CM | POA: Diagnosis not present

## 2020-06-19 DIAGNOSIS — D7389 Other diseases of spleen: Secondary | ICD-10-CM | POA: Diagnosis not present

## 2020-06-19 DIAGNOSIS — K219 Gastro-esophageal reflux disease without esophagitis: Secondary | ICD-10-CM | POA: Diagnosis not present

## 2020-06-19 DIAGNOSIS — N186 End stage renal disease: Secondary | ICD-10-CM | POA: Diagnosis not present

## 2020-06-19 DIAGNOSIS — Z992 Dependence on renal dialysis: Secondary | ICD-10-CM | POA: Diagnosis not present

## 2020-06-19 DIAGNOSIS — Z8679 Personal history of other diseases of the circulatory system: Secondary | ICD-10-CM | POA: Diagnosis not present

## 2020-06-19 DIAGNOSIS — R142 Eructation: Secondary | ICD-10-CM | POA: Diagnosis not present

## 2020-06-20 DIAGNOSIS — D509 Iron deficiency anemia, unspecified: Secondary | ICD-10-CM | POA: Diagnosis not present

## 2020-06-20 DIAGNOSIS — D689 Coagulation defect, unspecified: Secondary | ICD-10-CM | POA: Diagnosis not present

## 2020-06-20 DIAGNOSIS — N186 End stage renal disease: Secondary | ICD-10-CM | POA: Diagnosis not present

## 2020-06-20 DIAGNOSIS — D631 Anemia in chronic kidney disease: Secondary | ICD-10-CM | POA: Diagnosis not present

## 2020-06-20 DIAGNOSIS — Z992 Dependence on renal dialysis: Secondary | ICD-10-CM | POA: Diagnosis not present

## 2020-06-20 DIAGNOSIS — N2581 Secondary hyperparathyroidism of renal origin: Secondary | ICD-10-CM | POA: Diagnosis not present

## 2020-06-20 DIAGNOSIS — E876 Hypokalemia: Secondary | ICD-10-CM | POA: Diagnosis not present

## 2020-06-22 DIAGNOSIS — N186 End stage renal disease: Secondary | ICD-10-CM | POA: Diagnosis not present

## 2020-06-22 DIAGNOSIS — N2581 Secondary hyperparathyroidism of renal origin: Secondary | ICD-10-CM | POA: Diagnosis not present

## 2020-06-22 DIAGNOSIS — D509 Iron deficiency anemia, unspecified: Secondary | ICD-10-CM | POA: Diagnosis not present

## 2020-06-22 DIAGNOSIS — D689 Coagulation defect, unspecified: Secondary | ICD-10-CM | POA: Diagnosis not present

## 2020-06-22 DIAGNOSIS — E876 Hypokalemia: Secondary | ICD-10-CM | POA: Diagnosis not present

## 2020-06-22 DIAGNOSIS — Z992 Dependence on renal dialysis: Secondary | ICD-10-CM | POA: Diagnosis not present

## 2020-06-22 DIAGNOSIS — D631 Anemia in chronic kidney disease: Secondary | ICD-10-CM | POA: Diagnosis not present

## 2020-06-24 DIAGNOSIS — N2581 Secondary hyperparathyroidism of renal origin: Secondary | ICD-10-CM | POA: Diagnosis not present

## 2020-06-24 DIAGNOSIS — D689 Coagulation defect, unspecified: Secondary | ICD-10-CM | POA: Diagnosis not present

## 2020-06-24 DIAGNOSIS — Z992 Dependence on renal dialysis: Secondary | ICD-10-CM | POA: Diagnosis not present

## 2020-06-24 DIAGNOSIS — D509 Iron deficiency anemia, unspecified: Secondary | ICD-10-CM | POA: Diagnosis not present

## 2020-06-24 DIAGNOSIS — D631 Anemia in chronic kidney disease: Secondary | ICD-10-CM | POA: Diagnosis not present

## 2020-06-24 DIAGNOSIS — E876 Hypokalemia: Secondary | ICD-10-CM | POA: Diagnosis not present

## 2020-06-24 DIAGNOSIS — N186 End stage renal disease: Secondary | ICD-10-CM | POA: Diagnosis not present

## 2020-06-27 DIAGNOSIS — E876 Hypokalemia: Secondary | ICD-10-CM | POA: Diagnosis not present

## 2020-06-27 DIAGNOSIS — N186 End stage renal disease: Secondary | ICD-10-CM | POA: Diagnosis not present

## 2020-06-27 DIAGNOSIS — D689 Coagulation defect, unspecified: Secondary | ICD-10-CM | POA: Diagnosis not present

## 2020-06-27 DIAGNOSIS — D509 Iron deficiency anemia, unspecified: Secondary | ICD-10-CM | POA: Diagnosis not present

## 2020-06-27 DIAGNOSIS — N2581 Secondary hyperparathyroidism of renal origin: Secondary | ICD-10-CM | POA: Diagnosis not present

## 2020-06-27 DIAGNOSIS — Z992 Dependence on renal dialysis: Secondary | ICD-10-CM | POA: Diagnosis not present

## 2020-06-29 DIAGNOSIS — Z992 Dependence on renal dialysis: Secondary | ICD-10-CM | POA: Diagnosis not present

## 2020-06-29 DIAGNOSIS — N2581 Secondary hyperparathyroidism of renal origin: Secondary | ICD-10-CM | POA: Diagnosis not present

## 2020-06-29 DIAGNOSIS — E876 Hypokalemia: Secondary | ICD-10-CM | POA: Diagnosis not present

## 2020-06-29 DIAGNOSIS — D689 Coagulation defect, unspecified: Secondary | ICD-10-CM | POA: Diagnosis not present

## 2020-06-29 DIAGNOSIS — N186 End stage renal disease: Secondary | ICD-10-CM | POA: Diagnosis not present

## 2020-06-29 DIAGNOSIS — D509 Iron deficiency anemia, unspecified: Secondary | ICD-10-CM | POA: Diagnosis not present

## 2020-07-01 DIAGNOSIS — N186 End stage renal disease: Secondary | ICD-10-CM | POA: Diagnosis not present

## 2020-07-01 DIAGNOSIS — E876 Hypokalemia: Secondary | ICD-10-CM | POA: Diagnosis not present

## 2020-07-01 DIAGNOSIS — Z992 Dependence on renal dialysis: Secondary | ICD-10-CM | POA: Diagnosis not present

## 2020-07-01 DIAGNOSIS — D509 Iron deficiency anemia, unspecified: Secondary | ICD-10-CM | POA: Diagnosis not present

## 2020-07-01 DIAGNOSIS — D689 Coagulation defect, unspecified: Secondary | ICD-10-CM | POA: Diagnosis not present

## 2020-07-01 DIAGNOSIS — N2581 Secondary hyperparathyroidism of renal origin: Secondary | ICD-10-CM | POA: Diagnosis not present

## 2020-07-04 DIAGNOSIS — D689 Coagulation defect, unspecified: Secondary | ICD-10-CM | POA: Diagnosis not present

## 2020-07-04 DIAGNOSIS — N2581 Secondary hyperparathyroidism of renal origin: Secondary | ICD-10-CM | POA: Diagnosis not present

## 2020-07-04 DIAGNOSIS — D509 Iron deficiency anemia, unspecified: Secondary | ICD-10-CM | POA: Diagnosis not present

## 2020-07-04 DIAGNOSIS — N186 End stage renal disease: Secondary | ICD-10-CM | POA: Diagnosis not present

## 2020-07-04 DIAGNOSIS — Z992 Dependence on renal dialysis: Secondary | ICD-10-CM | POA: Diagnosis not present

## 2020-07-05 ENCOUNTER — Other Ambulatory Visit: Payer: Self-pay | Admitting: Cardiovascular Disease

## 2020-07-06 DIAGNOSIS — N186 End stage renal disease: Secondary | ICD-10-CM | POA: Diagnosis not present

## 2020-07-06 DIAGNOSIS — D689 Coagulation defect, unspecified: Secondary | ICD-10-CM | POA: Diagnosis not present

## 2020-07-06 DIAGNOSIS — D509 Iron deficiency anemia, unspecified: Secondary | ICD-10-CM | POA: Diagnosis not present

## 2020-07-06 DIAGNOSIS — N2581 Secondary hyperparathyroidism of renal origin: Secondary | ICD-10-CM | POA: Diagnosis not present

## 2020-07-06 DIAGNOSIS — Z992 Dependence on renal dialysis: Secondary | ICD-10-CM | POA: Diagnosis not present

## 2020-07-08 DIAGNOSIS — N186 End stage renal disease: Secondary | ICD-10-CM | POA: Diagnosis not present

## 2020-07-08 DIAGNOSIS — Z992 Dependence on renal dialysis: Secondary | ICD-10-CM | POA: Diagnosis not present

## 2020-07-08 DIAGNOSIS — D509 Iron deficiency anemia, unspecified: Secondary | ICD-10-CM | POA: Diagnosis not present

## 2020-07-08 DIAGNOSIS — D689 Coagulation defect, unspecified: Secondary | ICD-10-CM | POA: Diagnosis not present

## 2020-07-08 DIAGNOSIS — N2581 Secondary hyperparathyroidism of renal origin: Secondary | ICD-10-CM | POA: Diagnosis not present

## 2020-07-11 DIAGNOSIS — Z992 Dependence on renal dialysis: Secondary | ICD-10-CM | POA: Diagnosis not present

## 2020-07-11 DIAGNOSIS — E876 Hypokalemia: Secondary | ICD-10-CM | POA: Diagnosis not present

## 2020-07-11 DIAGNOSIS — N2581 Secondary hyperparathyroidism of renal origin: Secondary | ICD-10-CM | POA: Diagnosis not present

## 2020-07-11 DIAGNOSIS — D689 Coagulation defect, unspecified: Secondary | ICD-10-CM | POA: Diagnosis not present

## 2020-07-11 DIAGNOSIS — N186 End stage renal disease: Secondary | ICD-10-CM | POA: Diagnosis not present

## 2020-07-13 DIAGNOSIS — E876 Hypokalemia: Secondary | ICD-10-CM | POA: Diagnosis not present

## 2020-07-13 DIAGNOSIS — N186 End stage renal disease: Secondary | ICD-10-CM | POA: Diagnosis not present

## 2020-07-13 DIAGNOSIS — N2581 Secondary hyperparathyroidism of renal origin: Secondary | ICD-10-CM | POA: Diagnosis not present

## 2020-07-13 DIAGNOSIS — Z992 Dependence on renal dialysis: Secondary | ICD-10-CM | POA: Diagnosis not present

## 2020-07-13 DIAGNOSIS — D689 Coagulation defect, unspecified: Secondary | ICD-10-CM | POA: Diagnosis not present

## 2020-07-15 DIAGNOSIS — N2581 Secondary hyperparathyroidism of renal origin: Secondary | ICD-10-CM | POA: Diagnosis not present

## 2020-07-15 DIAGNOSIS — D689 Coagulation defect, unspecified: Secondary | ICD-10-CM | POA: Diagnosis not present

## 2020-07-15 DIAGNOSIS — I129 Hypertensive chronic kidney disease with stage 1 through stage 4 chronic kidney disease, or unspecified chronic kidney disease: Secondary | ICD-10-CM | POA: Diagnosis not present

## 2020-07-15 DIAGNOSIS — E876 Hypokalemia: Secondary | ICD-10-CM | POA: Diagnosis not present

## 2020-07-15 DIAGNOSIS — N186 End stage renal disease: Secondary | ICD-10-CM | POA: Diagnosis not present

## 2020-07-15 DIAGNOSIS — Z992 Dependence on renal dialysis: Secondary | ICD-10-CM | POA: Diagnosis not present

## 2020-07-18 DIAGNOSIS — N2581 Secondary hyperparathyroidism of renal origin: Secondary | ICD-10-CM | POA: Diagnosis not present

## 2020-07-18 DIAGNOSIS — D689 Coagulation defect, unspecified: Secondary | ICD-10-CM | POA: Diagnosis not present

## 2020-07-18 DIAGNOSIS — D631 Anemia in chronic kidney disease: Secondary | ICD-10-CM | POA: Diagnosis not present

## 2020-07-18 DIAGNOSIS — Z992 Dependence on renal dialysis: Secondary | ICD-10-CM | POA: Diagnosis not present

## 2020-07-18 DIAGNOSIS — E876 Hypokalemia: Secondary | ICD-10-CM | POA: Diagnosis not present

## 2020-07-18 DIAGNOSIS — N186 End stage renal disease: Secondary | ICD-10-CM | POA: Diagnosis not present

## 2020-07-20 DIAGNOSIS — D689 Coagulation defect, unspecified: Secondary | ICD-10-CM | POA: Diagnosis not present

## 2020-07-20 DIAGNOSIS — E876 Hypokalemia: Secondary | ICD-10-CM | POA: Diagnosis not present

## 2020-07-20 DIAGNOSIS — N2581 Secondary hyperparathyroidism of renal origin: Secondary | ICD-10-CM | POA: Diagnosis not present

## 2020-07-20 DIAGNOSIS — N186 End stage renal disease: Secondary | ICD-10-CM | POA: Diagnosis not present

## 2020-07-20 DIAGNOSIS — Z992 Dependence on renal dialysis: Secondary | ICD-10-CM | POA: Diagnosis not present

## 2020-07-20 DIAGNOSIS — D631 Anemia in chronic kidney disease: Secondary | ICD-10-CM | POA: Diagnosis not present

## 2020-07-22 DIAGNOSIS — D631 Anemia in chronic kidney disease: Secondary | ICD-10-CM | POA: Diagnosis not present

## 2020-07-22 DIAGNOSIS — N2581 Secondary hyperparathyroidism of renal origin: Secondary | ICD-10-CM | POA: Diagnosis not present

## 2020-07-22 DIAGNOSIS — D689 Coagulation defect, unspecified: Secondary | ICD-10-CM | POA: Diagnosis not present

## 2020-07-22 DIAGNOSIS — N186 End stage renal disease: Secondary | ICD-10-CM | POA: Diagnosis not present

## 2020-07-22 DIAGNOSIS — E876 Hypokalemia: Secondary | ICD-10-CM | POA: Diagnosis not present

## 2020-07-22 DIAGNOSIS — Z992 Dependence on renal dialysis: Secondary | ICD-10-CM | POA: Diagnosis not present

## 2020-07-25 DIAGNOSIS — E876 Hypokalemia: Secondary | ICD-10-CM | POA: Diagnosis not present

## 2020-07-25 DIAGNOSIS — D689 Coagulation defect, unspecified: Secondary | ICD-10-CM | POA: Diagnosis not present

## 2020-07-25 DIAGNOSIS — Z992 Dependence on renal dialysis: Secondary | ICD-10-CM | POA: Diagnosis not present

## 2020-07-25 DIAGNOSIS — N2581 Secondary hyperparathyroidism of renal origin: Secondary | ICD-10-CM | POA: Diagnosis not present

## 2020-07-25 DIAGNOSIS — N186 End stage renal disease: Secondary | ICD-10-CM | POA: Diagnosis not present

## 2020-07-27 DIAGNOSIS — Z992 Dependence on renal dialysis: Secondary | ICD-10-CM | POA: Diagnosis not present

## 2020-07-27 DIAGNOSIS — E876 Hypokalemia: Secondary | ICD-10-CM | POA: Diagnosis not present

## 2020-07-27 DIAGNOSIS — N186 End stage renal disease: Secondary | ICD-10-CM | POA: Diagnosis not present

## 2020-07-27 DIAGNOSIS — N2581 Secondary hyperparathyroidism of renal origin: Secondary | ICD-10-CM | POA: Diagnosis not present

## 2020-07-27 DIAGNOSIS — D689 Coagulation defect, unspecified: Secondary | ICD-10-CM | POA: Diagnosis not present

## 2020-07-29 DIAGNOSIS — Z992 Dependence on renal dialysis: Secondary | ICD-10-CM | POA: Diagnosis not present

## 2020-07-29 DIAGNOSIS — N2581 Secondary hyperparathyroidism of renal origin: Secondary | ICD-10-CM | POA: Diagnosis not present

## 2020-07-29 DIAGNOSIS — N186 End stage renal disease: Secondary | ICD-10-CM | POA: Diagnosis not present

## 2020-07-29 DIAGNOSIS — D689 Coagulation defect, unspecified: Secondary | ICD-10-CM | POA: Diagnosis not present

## 2020-07-29 DIAGNOSIS — E876 Hypokalemia: Secondary | ICD-10-CM | POA: Diagnosis not present

## 2020-08-01 DIAGNOSIS — Z992 Dependence on renal dialysis: Secondary | ICD-10-CM | POA: Diagnosis not present

## 2020-08-01 DIAGNOSIS — N2581 Secondary hyperparathyroidism of renal origin: Secondary | ICD-10-CM | POA: Diagnosis not present

## 2020-08-01 DIAGNOSIS — D689 Coagulation defect, unspecified: Secondary | ICD-10-CM | POA: Diagnosis not present

## 2020-08-01 DIAGNOSIS — N186 End stage renal disease: Secondary | ICD-10-CM | POA: Diagnosis not present

## 2020-08-03 DIAGNOSIS — D689 Coagulation defect, unspecified: Secondary | ICD-10-CM | POA: Diagnosis not present

## 2020-08-03 DIAGNOSIS — Z992 Dependence on renal dialysis: Secondary | ICD-10-CM | POA: Diagnosis not present

## 2020-08-03 DIAGNOSIS — N2581 Secondary hyperparathyroidism of renal origin: Secondary | ICD-10-CM | POA: Diagnosis not present

## 2020-08-03 DIAGNOSIS — N186 End stage renal disease: Secondary | ICD-10-CM | POA: Diagnosis not present

## 2020-08-05 DIAGNOSIS — N2581 Secondary hyperparathyroidism of renal origin: Secondary | ICD-10-CM | POA: Diagnosis not present

## 2020-08-05 DIAGNOSIS — N186 End stage renal disease: Secondary | ICD-10-CM | POA: Diagnosis not present

## 2020-08-05 DIAGNOSIS — Z992 Dependence on renal dialysis: Secondary | ICD-10-CM | POA: Diagnosis not present

## 2020-08-05 DIAGNOSIS — D689 Coagulation defect, unspecified: Secondary | ICD-10-CM | POA: Diagnosis not present

## 2020-08-08 DIAGNOSIS — D689 Coagulation defect, unspecified: Secondary | ICD-10-CM | POA: Diagnosis not present

## 2020-08-08 DIAGNOSIS — N2581 Secondary hyperparathyroidism of renal origin: Secondary | ICD-10-CM | POA: Diagnosis not present

## 2020-08-08 DIAGNOSIS — Z992 Dependence on renal dialysis: Secondary | ICD-10-CM | POA: Diagnosis not present

## 2020-08-08 DIAGNOSIS — E876 Hypokalemia: Secondary | ICD-10-CM | POA: Diagnosis not present

## 2020-08-08 DIAGNOSIS — N186 End stage renal disease: Secondary | ICD-10-CM | POA: Diagnosis not present

## 2020-08-10 DIAGNOSIS — D689 Coagulation defect, unspecified: Secondary | ICD-10-CM | POA: Diagnosis not present

## 2020-08-10 DIAGNOSIS — N2581 Secondary hyperparathyroidism of renal origin: Secondary | ICD-10-CM | POA: Diagnosis not present

## 2020-08-10 DIAGNOSIS — Z992 Dependence on renal dialysis: Secondary | ICD-10-CM | POA: Diagnosis not present

## 2020-08-10 DIAGNOSIS — N186 End stage renal disease: Secondary | ICD-10-CM | POA: Diagnosis not present

## 2020-08-10 DIAGNOSIS — E876 Hypokalemia: Secondary | ICD-10-CM | POA: Diagnosis not present

## 2020-08-12 DIAGNOSIS — N2581 Secondary hyperparathyroidism of renal origin: Secondary | ICD-10-CM | POA: Diagnosis not present

## 2020-08-12 DIAGNOSIS — Z992 Dependence on renal dialysis: Secondary | ICD-10-CM | POA: Diagnosis not present

## 2020-08-12 DIAGNOSIS — E876 Hypokalemia: Secondary | ICD-10-CM | POA: Diagnosis not present

## 2020-08-12 DIAGNOSIS — D689 Coagulation defect, unspecified: Secondary | ICD-10-CM | POA: Diagnosis not present

## 2020-08-12 DIAGNOSIS — N186 End stage renal disease: Secondary | ICD-10-CM | POA: Diagnosis not present

## 2020-08-15 DIAGNOSIS — I129 Hypertensive chronic kidney disease with stage 1 through stage 4 chronic kidney disease, or unspecified chronic kidney disease: Secondary | ICD-10-CM | POA: Diagnosis not present

## 2020-08-15 DIAGNOSIS — N2581 Secondary hyperparathyroidism of renal origin: Secondary | ICD-10-CM | POA: Diagnosis not present

## 2020-08-15 DIAGNOSIS — D689 Coagulation defect, unspecified: Secondary | ICD-10-CM | POA: Diagnosis not present

## 2020-08-15 DIAGNOSIS — Z992 Dependence on renal dialysis: Secondary | ICD-10-CM | POA: Diagnosis not present

## 2020-08-15 DIAGNOSIS — N186 End stage renal disease: Secondary | ICD-10-CM | POA: Diagnosis not present

## 2020-08-17 DIAGNOSIS — Z992 Dependence on renal dialysis: Secondary | ICD-10-CM | POA: Diagnosis not present

## 2020-08-17 DIAGNOSIS — D689 Coagulation defect, unspecified: Secondary | ICD-10-CM | POA: Diagnosis not present

## 2020-08-17 DIAGNOSIS — D631 Anemia in chronic kidney disease: Secondary | ICD-10-CM | POA: Diagnosis not present

## 2020-08-17 DIAGNOSIS — N2581 Secondary hyperparathyroidism of renal origin: Secondary | ICD-10-CM | POA: Diagnosis not present

## 2020-08-17 DIAGNOSIS — N186 End stage renal disease: Secondary | ICD-10-CM | POA: Diagnosis not present

## 2020-08-19 DIAGNOSIS — Z992 Dependence on renal dialysis: Secondary | ICD-10-CM | POA: Diagnosis not present

## 2020-08-19 DIAGNOSIS — N2581 Secondary hyperparathyroidism of renal origin: Secondary | ICD-10-CM | POA: Diagnosis not present

## 2020-08-19 DIAGNOSIS — N186 End stage renal disease: Secondary | ICD-10-CM | POA: Diagnosis not present

## 2020-08-19 DIAGNOSIS — D631 Anemia in chronic kidney disease: Secondary | ICD-10-CM | POA: Diagnosis not present

## 2020-08-19 DIAGNOSIS — D689 Coagulation defect, unspecified: Secondary | ICD-10-CM | POA: Diagnosis not present

## 2020-08-22 DIAGNOSIS — Z992 Dependence on renal dialysis: Secondary | ICD-10-CM | POA: Diagnosis not present

## 2020-08-22 DIAGNOSIS — E876 Hypokalemia: Secondary | ICD-10-CM | POA: Diagnosis not present

## 2020-08-22 DIAGNOSIS — D689 Coagulation defect, unspecified: Secondary | ICD-10-CM | POA: Diagnosis not present

## 2020-08-22 DIAGNOSIS — N186 End stage renal disease: Secondary | ICD-10-CM | POA: Diagnosis not present

## 2020-08-22 DIAGNOSIS — N2581 Secondary hyperparathyroidism of renal origin: Secondary | ICD-10-CM | POA: Diagnosis not present

## 2020-08-24 DIAGNOSIS — D689 Coagulation defect, unspecified: Secondary | ICD-10-CM | POA: Diagnosis not present

## 2020-08-24 DIAGNOSIS — Z992 Dependence on renal dialysis: Secondary | ICD-10-CM | POA: Diagnosis not present

## 2020-08-24 DIAGNOSIS — E876 Hypokalemia: Secondary | ICD-10-CM | POA: Diagnosis not present

## 2020-08-24 DIAGNOSIS — N186 End stage renal disease: Secondary | ICD-10-CM | POA: Diagnosis not present

## 2020-08-24 DIAGNOSIS — N2581 Secondary hyperparathyroidism of renal origin: Secondary | ICD-10-CM | POA: Diagnosis not present

## 2020-08-26 DIAGNOSIS — N186 End stage renal disease: Secondary | ICD-10-CM | POA: Diagnosis not present

## 2020-08-26 DIAGNOSIS — N2581 Secondary hyperparathyroidism of renal origin: Secondary | ICD-10-CM | POA: Diagnosis not present

## 2020-08-26 DIAGNOSIS — D689 Coagulation defect, unspecified: Secondary | ICD-10-CM | POA: Diagnosis not present

## 2020-08-26 DIAGNOSIS — Z992 Dependence on renal dialysis: Secondary | ICD-10-CM | POA: Diagnosis not present

## 2020-08-26 DIAGNOSIS — E876 Hypokalemia: Secondary | ICD-10-CM | POA: Diagnosis not present

## 2020-08-29 DIAGNOSIS — N2581 Secondary hyperparathyroidism of renal origin: Secondary | ICD-10-CM | POA: Diagnosis not present

## 2020-08-29 DIAGNOSIS — Z992 Dependence on renal dialysis: Secondary | ICD-10-CM | POA: Diagnosis not present

## 2020-08-29 DIAGNOSIS — N186 End stage renal disease: Secondary | ICD-10-CM | POA: Diagnosis not present

## 2020-08-29 DIAGNOSIS — D689 Coagulation defect, unspecified: Secondary | ICD-10-CM | POA: Diagnosis not present

## 2020-08-31 DIAGNOSIS — Z992 Dependence on renal dialysis: Secondary | ICD-10-CM | POA: Diagnosis not present

## 2020-08-31 DIAGNOSIS — N186 End stage renal disease: Secondary | ICD-10-CM | POA: Diagnosis not present

## 2020-08-31 DIAGNOSIS — N2581 Secondary hyperparathyroidism of renal origin: Secondary | ICD-10-CM | POA: Diagnosis not present

## 2020-08-31 DIAGNOSIS — D689 Coagulation defect, unspecified: Secondary | ICD-10-CM | POA: Diagnosis not present

## 2020-09-02 DIAGNOSIS — N186 End stage renal disease: Secondary | ICD-10-CM | POA: Diagnosis not present

## 2020-09-02 DIAGNOSIS — N2581 Secondary hyperparathyroidism of renal origin: Secondary | ICD-10-CM | POA: Diagnosis not present

## 2020-09-02 DIAGNOSIS — D689 Coagulation defect, unspecified: Secondary | ICD-10-CM | POA: Diagnosis not present

## 2020-09-02 DIAGNOSIS — Z992 Dependence on renal dialysis: Secondary | ICD-10-CM | POA: Diagnosis not present

## 2020-09-05 DIAGNOSIS — Z992 Dependence on renal dialysis: Secondary | ICD-10-CM | POA: Diagnosis not present

## 2020-09-05 DIAGNOSIS — N186 End stage renal disease: Secondary | ICD-10-CM | POA: Diagnosis not present

## 2020-09-05 DIAGNOSIS — N2581 Secondary hyperparathyroidism of renal origin: Secondary | ICD-10-CM | POA: Diagnosis not present

## 2020-09-05 DIAGNOSIS — E876 Hypokalemia: Secondary | ICD-10-CM | POA: Diagnosis not present

## 2020-09-05 DIAGNOSIS — D689 Coagulation defect, unspecified: Secondary | ICD-10-CM | POA: Diagnosis not present

## 2020-09-07 DIAGNOSIS — E876 Hypokalemia: Secondary | ICD-10-CM | POA: Diagnosis not present

## 2020-09-07 DIAGNOSIS — Z992 Dependence on renal dialysis: Secondary | ICD-10-CM | POA: Diagnosis not present

## 2020-09-07 DIAGNOSIS — N2581 Secondary hyperparathyroidism of renal origin: Secondary | ICD-10-CM | POA: Diagnosis not present

## 2020-09-07 DIAGNOSIS — N186 End stage renal disease: Secondary | ICD-10-CM | POA: Diagnosis not present

## 2020-09-07 DIAGNOSIS — D689 Coagulation defect, unspecified: Secondary | ICD-10-CM | POA: Diagnosis not present

## 2020-09-09 DIAGNOSIS — N186 End stage renal disease: Secondary | ICD-10-CM | POA: Diagnosis not present

## 2020-09-09 DIAGNOSIS — E876 Hypokalemia: Secondary | ICD-10-CM | POA: Diagnosis not present

## 2020-09-09 DIAGNOSIS — N2581 Secondary hyperparathyroidism of renal origin: Secondary | ICD-10-CM | POA: Diagnosis not present

## 2020-09-09 DIAGNOSIS — Z992 Dependence on renal dialysis: Secondary | ICD-10-CM | POA: Diagnosis not present

## 2020-09-09 DIAGNOSIS — D689 Coagulation defect, unspecified: Secondary | ICD-10-CM | POA: Diagnosis not present

## 2020-09-12 DIAGNOSIS — Z992 Dependence on renal dialysis: Secondary | ICD-10-CM | POA: Diagnosis not present

## 2020-09-12 DIAGNOSIS — D689 Coagulation defect, unspecified: Secondary | ICD-10-CM | POA: Diagnosis not present

## 2020-09-12 DIAGNOSIS — N186 End stage renal disease: Secondary | ICD-10-CM | POA: Diagnosis not present

## 2020-09-12 DIAGNOSIS — D631 Anemia in chronic kidney disease: Secondary | ICD-10-CM | POA: Diagnosis not present

## 2020-09-12 DIAGNOSIS — N2581 Secondary hyperparathyroidism of renal origin: Secondary | ICD-10-CM | POA: Diagnosis not present

## 2020-09-14 DIAGNOSIS — N2581 Secondary hyperparathyroidism of renal origin: Secondary | ICD-10-CM | POA: Diagnosis not present

## 2020-09-14 DIAGNOSIS — Z992 Dependence on renal dialysis: Secondary | ICD-10-CM | POA: Diagnosis not present

## 2020-09-14 DIAGNOSIS — N186 End stage renal disease: Secondary | ICD-10-CM | POA: Diagnosis not present

## 2020-09-14 DIAGNOSIS — I129 Hypertensive chronic kidney disease with stage 1 through stage 4 chronic kidney disease, or unspecified chronic kidney disease: Secondary | ICD-10-CM | POA: Diagnosis not present

## 2020-09-14 DIAGNOSIS — D689 Coagulation defect, unspecified: Secondary | ICD-10-CM | POA: Diagnosis not present

## 2020-09-14 DIAGNOSIS — D631 Anemia in chronic kidney disease: Secondary | ICD-10-CM | POA: Diagnosis not present

## 2020-09-16 DIAGNOSIS — E876 Hypokalemia: Secondary | ICD-10-CM | POA: Diagnosis not present

## 2020-09-16 DIAGNOSIS — Z992 Dependence on renal dialysis: Secondary | ICD-10-CM | POA: Diagnosis not present

## 2020-09-16 DIAGNOSIS — D631 Anemia in chronic kidney disease: Secondary | ICD-10-CM | POA: Diagnosis not present

## 2020-09-16 DIAGNOSIS — N186 End stage renal disease: Secondary | ICD-10-CM | POA: Diagnosis not present

## 2020-09-16 DIAGNOSIS — D509 Iron deficiency anemia, unspecified: Secondary | ICD-10-CM | POA: Diagnosis not present

## 2020-09-16 DIAGNOSIS — N2581 Secondary hyperparathyroidism of renal origin: Secondary | ICD-10-CM | POA: Diagnosis not present

## 2020-09-16 DIAGNOSIS — D689 Coagulation defect, unspecified: Secondary | ICD-10-CM | POA: Diagnosis not present

## 2020-09-19 DIAGNOSIS — N2581 Secondary hyperparathyroidism of renal origin: Secondary | ICD-10-CM | POA: Diagnosis not present

## 2020-09-19 DIAGNOSIS — D509 Iron deficiency anemia, unspecified: Secondary | ICD-10-CM | POA: Diagnosis not present

## 2020-09-19 DIAGNOSIS — E876 Hypokalemia: Secondary | ICD-10-CM | POA: Diagnosis not present

## 2020-09-19 DIAGNOSIS — N186 End stage renal disease: Secondary | ICD-10-CM | POA: Diagnosis not present

## 2020-09-19 DIAGNOSIS — D631 Anemia in chronic kidney disease: Secondary | ICD-10-CM | POA: Diagnosis not present

## 2020-09-19 DIAGNOSIS — Z992 Dependence on renal dialysis: Secondary | ICD-10-CM | POA: Diagnosis not present

## 2020-09-19 DIAGNOSIS — D689 Coagulation defect, unspecified: Secondary | ICD-10-CM | POA: Diagnosis not present

## 2020-09-21 DIAGNOSIS — E876 Hypokalemia: Secondary | ICD-10-CM | POA: Diagnosis not present

## 2020-09-21 DIAGNOSIS — Z992 Dependence on renal dialysis: Secondary | ICD-10-CM | POA: Diagnosis not present

## 2020-09-21 DIAGNOSIS — N186 End stage renal disease: Secondary | ICD-10-CM | POA: Diagnosis not present

## 2020-09-21 DIAGNOSIS — D631 Anemia in chronic kidney disease: Secondary | ICD-10-CM | POA: Diagnosis not present

## 2020-09-21 DIAGNOSIS — N2581 Secondary hyperparathyroidism of renal origin: Secondary | ICD-10-CM | POA: Diagnosis not present

## 2020-09-21 DIAGNOSIS — D689 Coagulation defect, unspecified: Secondary | ICD-10-CM | POA: Diagnosis not present

## 2020-09-21 DIAGNOSIS — D509 Iron deficiency anemia, unspecified: Secondary | ICD-10-CM | POA: Diagnosis not present

## 2020-09-23 DIAGNOSIS — N186 End stage renal disease: Secondary | ICD-10-CM | POA: Diagnosis not present

## 2020-09-23 DIAGNOSIS — D689 Coagulation defect, unspecified: Secondary | ICD-10-CM | POA: Diagnosis not present

## 2020-09-23 DIAGNOSIS — Z992 Dependence on renal dialysis: Secondary | ICD-10-CM | POA: Diagnosis not present

## 2020-09-23 DIAGNOSIS — D631 Anemia in chronic kidney disease: Secondary | ICD-10-CM | POA: Diagnosis not present

## 2020-09-23 DIAGNOSIS — D509 Iron deficiency anemia, unspecified: Secondary | ICD-10-CM | POA: Diagnosis not present

## 2020-09-23 DIAGNOSIS — N2581 Secondary hyperparathyroidism of renal origin: Secondary | ICD-10-CM | POA: Diagnosis not present

## 2020-09-23 DIAGNOSIS — E876 Hypokalemia: Secondary | ICD-10-CM | POA: Diagnosis not present

## 2020-09-26 DIAGNOSIS — D509 Iron deficiency anemia, unspecified: Secondary | ICD-10-CM | POA: Diagnosis not present

## 2020-09-26 DIAGNOSIS — N2581 Secondary hyperparathyroidism of renal origin: Secondary | ICD-10-CM | POA: Diagnosis not present

## 2020-09-26 DIAGNOSIS — Z992 Dependence on renal dialysis: Secondary | ICD-10-CM | POA: Diagnosis not present

## 2020-09-26 DIAGNOSIS — N186 End stage renal disease: Secondary | ICD-10-CM | POA: Diagnosis not present

## 2020-09-26 DIAGNOSIS — E876 Hypokalemia: Secondary | ICD-10-CM | POA: Diagnosis not present

## 2020-09-26 DIAGNOSIS — D689 Coagulation defect, unspecified: Secondary | ICD-10-CM | POA: Diagnosis not present

## 2020-09-26 DIAGNOSIS — D631 Anemia in chronic kidney disease: Secondary | ICD-10-CM | POA: Diagnosis not present

## 2020-09-28 DIAGNOSIS — D689 Coagulation defect, unspecified: Secondary | ICD-10-CM | POA: Diagnosis not present

## 2020-09-28 DIAGNOSIS — D509 Iron deficiency anemia, unspecified: Secondary | ICD-10-CM | POA: Diagnosis not present

## 2020-09-28 DIAGNOSIS — N186 End stage renal disease: Secondary | ICD-10-CM | POA: Diagnosis not present

## 2020-09-28 DIAGNOSIS — N2581 Secondary hyperparathyroidism of renal origin: Secondary | ICD-10-CM | POA: Diagnosis not present

## 2020-09-28 DIAGNOSIS — D631 Anemia in chronic kidney disease: Secondary | ICD-10-CM | POA: Diagnosis not present

## 2020-09-28 DIAGNOSIS — Z992 Dependence on renal dialysis: Secondary | ICD-10-CM | POA: Diagnosis not present

## 2020-09-28 DIAGNOSIS — E876 Hypokalemia: Secondary | ICD-10-CM | POA: Diagnosis not present

## 2020-09-30 DIAGNOSIS — Z992 Dependence on renal dialysis: Secondary | ICD-10-CM | POA: Diagnosis not present

## 2020-09-30 DIAGNOSIS — E876 Hypokalemia: Secondary | ICD-10-CM | POA: Diagnosis not present

## 2020-09-30 DIAGNOSIS — D509 Iron deficiency anemia, unspecified: Secondary | ICD-10-CM | POA: Diagnosis not present

## 2020-09-30 DIAGNOSIS — D631 Anemia in chronic kidney disease: Secondary | ICD-10-CM | POA: Diagnosis not present

## 2020-09-30 DIAGNOSIS — D689 Coagulation defect, unspecified: Secondary | ICD-10-CM | POA: Diagnosis not present

## 2020-09-30 DIAGNOSIS — N186 End stage renal disease: Secondary | ICD-10-CM | POA: Diagnosis not present

## 2020-09-30 DIAGNOSIS — N2581 Secondary hyperparathyroidism of renal origin: Secondary | ICD-10-CM | POA: Diagnosis not present

## 2020-10-03 DIAGNOSIS — D689 Coagulation defect, unspecified: Secondary | ICD-10-CM | POA: Diagnosis not present

## 2020-10-03 DIAGNOSIS — N2581 Secondary hyperparathyroidism of renal origin: Secondary | ICD-10-CM | POA: Diagnosis not present

## 2020-10-03 DIAGNOSIS — E876 Hypokalemia: Secondary | ICD-10-CM | POA: Diagnosis not present

## 2020-10-03 DIAGNOSIS — Z992 Dependence on renal dialysis: Secondary | ICD-10-CM | POA: Diagnosis not present

## 2020-10-03 DIAGNOSIS — D631 Anemia in chronic kidney disease: Secondary | ICD-10-CM | POA: Diagnosis not present

## 2020-10-03 DIAGNOSIS — N186 End stage renal disease: Secondary | ICD-10-CM | POA: Diagnosis not present

## 2020-10-03 DIAGNOSIS — D509 Iron deficiency anemia, unspecified: Secondary | ICD-10-CM | POA: Diagnosis not present

## 2020-10-05 DIAGNOSIS — D631 Anemia in chronic kidney disease: Secondary | ICD-10-CM | POA: Diagnosis not present

## 2020-10-05 DIAGNOSIS — N186 End stage renal disease: Secondary | ICD-10-CM | POA: Diagnosis not present

## 2020-10-05 DIAGNOSIS — E876 Hypokalemia: Secondary | ICD-10-CM | POA: Diagnosis not present

## 2020-10-05 DIAGNOSIS — Z992 Dependence on renal dialysis: Secondary | ICD-10-CM | POA: Diagnosis not present

## 2020-10-05 DIAGNOSIS — D689 Coagulation defect, unspecified: Secondary | ICD-10-CM | POA: Diagnosis not present

## 2020-10-05 DIAGNOSIS — D509 Iron deficiency anemia, unspecified: Secondary | ICD-10-CM | POA: Diagnosis not present

## 2020-10-05 DIAGNOSIS — N2581 Secondary hyperparathyroidism of renal origin: Secondary | ICD-10-CM | POA: Diagnosis not present

## 2020-10-07 DIAGNOSIS — D689 Coagulation defect, unspecified: Secondary | ICD-10-CM | POA: Diagnosis not present

## 2020-10-07 DIAGNOSIS — D631 Anemia in chronic kidney disease: Secondary | ICD-10-CM | POA: Diagnosis not present

## 2020-10-07 DIAGNOSIS — E876 Hypokalemia: Secondary | ICD-10-CM | POA: Diagnosis not present

## 2020-10-07 DIAGNOSIS — D509 Iron deficiency anemia, unspecified: Secondary | ICD-10-CM | POA: Diagnosis not present

## 2020-10-07 DIAGNOSIS — Z992 Dependence on renal dialysis: Secondary | ICD-10-CM | POA: Diagnosis not present

## 2020-10-07 DIAGNOSIS — N2581 Secondary hyperparathyroidism of renal origin: Secondary | ICD-10-CM | POA: Diagnosis not present

## 2020-10-07 DIAGNOSIS — N186 End stage renal disease: Secondary | ICD-10-CM | POA: Diagnosis not present

## 2020-10-10 DIAGNOSIS — N186 End stage renal disease: Secondary | ICD-10-CM | POA: Diagnosis not present

## 2020-10-10 DIAGNOSIS — N2581 Secondary hyperparathyroidism of renal origin: Secondary | ICD-10-CM | POA: Diagnosis not present

## 2020-10-10 DIAGNOSIS — D509 Iron deficiency anemia, unspecified: Secondary | ICD-10-CM | POA: Diagnosis not present

## 2020-10-10 DIAGNOSIS — D689 Coagulation defect, unspecified: Secondary | ICD-10-CM | POA: Diagnosis not present

## 2020-10-10 DIAGNOSIS — E876 Hypokalemia: Secondary | ICD-10-CM | POA: Diagnosis not present

## 2020-10-10 DIAGNOSIS — D631 Anemia in chronic kidney disease: Secondary | ICD-10-CM | POA: Diagnosis not present

## 2020-10-10 DIAGNOSIS — Z992 Dependence on renal dialysis: Secondary | ICD-10-CM | POA: Diagnosis not present

## 2020-10-12 DIAGNOSIS — N2581 Secondary hyperparathyroidism of renal origin: Secondary | ICD-10-CM | POA: Diagnosis not present

## 2020-10-12 DIAGNOSIS — N186 End stage renal disease: Secondary | ICD-10-CM | POA: Diagnosis not present

## 2020-10-12 DIAGNOSIS — D631 Anemia in chronic kidney disease: Secondary | ICD-10-CM | POA: Diagnosis not present

## 2020-10-12 DIAGNOSIS — E876 Hypokalemia: Secondary | ICD-10-CM | POA: Diagnosis not present

## 2020-10-12 DIAGNOSIS — D509 Iron deficiency anemia, unspecified: Secondary | ICD-10-CM | POA: Diagnosis not present

## 2020-10-12 DIAGNOSIS — Z992 Dependence on renal dialysis: Secondary | ICD-10-CM | POA: Diagnosis not present

## 2020-10-12 DIAGNOSIS — D689 Coagulation defect, unspecified: Secondary | ICD-10-CM | POA: Diagnosis not present

## 2020-10-14 DIAGNOSIS — D689 Coagulation defect, unspecified: Secondary | ICD-10-CM | POA: Diagnosis not present

## 2020-10-14 DIAGNOSIS — Z992 Dependence on renal dialysis: Secondary | ICD-10-CM | POA: Diagnosis not present

## 2020-10-14 DIAGNOSIS — N2581 Secondary hyperparathyroidism of renal origin: Secondary | ICD-10-CM | POA: Diagnosis not present

## 2020-10-14 DIAGNOSIS — D509 Iron deficiency anemia, unspecified: Secondary | ICD-10-CM | POA: Diagnosis not present

## 2020-10-14 DIAGNOSIS — E876 Hypokalemia: Secondary | ICD-10-CM | POA: Diagnosis not present

## 2020-10-14 DIAGNOSIS — D631 Anemia in chronic kidney disease: Secondary | ICD-10-CM | POA: Diagnosis not present

## 2020-10-14 DIAGNOSIS — N186 End stage renal disease: Secondary | ICD-10-CM | POA: Diagnosis not present

## 2020-10-15 DIAGNOSIS — N186 End stage renal disease: Secondary | ICD-10-CM | POA: Diagnosis not present

## 2020-10-15 DIAGNOSIS — Z992 Dependence on renal dialysis: Secondary | ICD-10-CM | POA: Diagnosis not present

## 2020-10-15 DIAGNOSIS — I129 Hypertensive chronic kidney disease with stage 1 through stage 4 chronic kidney disease, or unspecified chronic kidney disease: Secondary | ICD-10-CM | POA: Diagnosis not present

## 2020-10-17 DIAGNOSIS — E876 Hypokalemia: Secondary | ICD-10-CM | POA: Diagnosis not present

## 2020-10-17 DIAGNOSIS — N2581 Secondary hyperparathyroidism of renal origin: Secondary | ICD-10-CM | POA: Diagnosis not present

## 2020-10-17 DIAGNOSIS — N186 End stage renal disease: Secondary | ICD-10-CM | POA: Diagnosis not present

## 2020-10-17 DIAGNOSIS — D509 Iron deficiency anemia, unspecified: Secondary | ICD-10-CM | POA: Diagnosis not present

## 2020-10-17 DIAGNOSIS — D631 Anemia in chronic kidney disease: Secondary | ICD-10-CM | POA: Diagnosis not present

## 2020-10-17 DIAGNOSIS — Z992 Dependence on renal dialysis: Secondary | ICD-10-CM | POA: Diagnosis not present

## 2020-10-17 DIAGNOSIS — D689 Coagulation defect, unspecified: Secondary | ICD-10-CM | POA: Diagnosis not present

## 2020-10-19 DIAGNOSIS — E876 Hypokalemia: Secondary | ICD-10-CM | POA: Diagnosis not present

## 2020-10-19 DIAGNOSIS — N2581 Secondary hyperparathyroidism of renal origin: Secondary | ICD-10-CM | POA: Diagnosis not present

## 2020-10-19 DIAGNOSIS — N186 End stage renal disease: Secondary | ICD-10-CM | POA: Diagnosis not present

## 2020-10-19 DIAGNOSIS — D631 Anemia in chronic kidney disease: Secondary | ICD-10-CM | POA: Diagnosis not present

## 2020-10-19 DIAGNOSIS — D509 Iron deficiency anemia, unspecified: Secondary | ICD-10-CM | POA: Diagnosis not present

## 2020-10-19 DIAGNOSIS — D689 Coagulation defect, unspecified: Secondary | ICD-10-CM | POA: Diagnosis not present

## 2020-10-19 DIAGNOSIS — Z992 Dependence on renal dialysis: Secondary | ICD-10-CM | POA: Diagnosis not present

## 2020-10-21 DIAGNOSIS — N186 End stage renal disease: Secondary | ICD-10-CM | POA: Diagnosis not present

## 2020-10-21 DIAGNOSIS — D631 Anemia in chronic kidney disease: Secondary | ICD-10-CM | POA: Diagnosis not present

## 2020-10-21 DIAGNOSIS — Z992 Dependence on renal dialysis: Secondary | ICD-10-CM | POA: Diagnosis not present

## 2020-10-21 DIAGNOSIS — E876 Hypokalemia: Secondary | ICD-10-CM | POA: Diagnosis not present

## 2020-10-21 DIAGNOSIS — D689 Coagulation defect, unspecified: Secondary | ICD-10-CM | POA: Diagnosis not present

## 2020-10-21 DIAGNOSIS — N2581 Secondary hyperparathyroidism of renal origin: Secondary | ICD-10-CM | POA: Diagnosis not present

## 2020-10-21 DIAGNOSIS — D509 Iron deficiency anemia, unspecified: Secondary | ICD-10-CM | POA: Diagnosis not present

## 2020-10-24 DIAGNOSIS — N186 End stage renal disease: Secondary | ICD-10-CM | POA: Diagnosis not present

## 2020-10-24 DIAGNOSIS — D689 Coagulation defect, unspecified: Secondary | ICD-10-CM | POA: Diagnosis not present

## 2020-10-24 DIAGNOSIS — D631 Anemia in chronic kidney disease: Secondary | ICD-10-CM | POA: Diagnosis not present

## 2020-10-24 DIAGNOSIS — Z992 Dependence on renal dialysis: Secondary | ICD-10-CM | POA: Diagnosis not present

## 2020-10-24 DIAGNOSIS — D509 Iron deficiency anemia, unspecified: Secondary | ICD-10-CM | POA: Diagnosis not present

## 2020-10-24 DIAGNOSIS — E876 Hypokalemia: Secondary | ICD-10-CM | POA: Diagnosis not present

## 2020-10-24 DIAGNOSIS — N2581 Secondary hyperparathyroidism of renal origin: Secondary | ICD-10-CM | POA: Diagnosis not present

## 2020-10-26 DIAGNOSIS — D689 Coagulation defect, unspecified: Secondary | ICD-10-CM | POA: Diagnosis not present

## 2020-10-26 DIAGNOSIS — N186 End stage renal disease: Secondary | ICD-10-CM | POA: Diagnosis not present

## 2020-10-26 DIAGNOSIS — E876 Hypokalemia: Secondary | ICD-10-CM | POA: Diagnosis not present

## 2020-10-26 DIAGNOSIS — Z992 Dependence on renal dialysis: Secondary | ICD-10-CM | POA: Diagnosis not present

## 2020-10-26 DIAGNOSIS — N2581 Secondary hyperparathyroidism of renal origin: Secondary | ICD-10-CM | POA: Diagnosis not present

## 2020-10-26 DIAGNOSIS — D509 Iron deficiency anemia, unspecified: Secondary | ICD-10-CM | POA: Diagnosis not present

## 2020-10-26 DIAGNOSIS — D631 Anemia in chronic kidney disease: Secondary | ICD-10-CM | POA: Diagnosis not present

## 2020-10-28 DIAGNOSIS — D509 Iron deficiency anemia, unspecified: Secondary | ICD-10-CM | POA: Diagnosis not present

## 2020-10-28 DIAGNOSIS — E876 Hypokalemia: Secondary | ICD-10-CM | POA: Diagnosis not present

## 2020-10-28 DIAGNOSIS — Z992 Dependence on renal dialysis: Secondary | ICD-10-CM | POA: Diagnosis not present

## 2020-10-28 DIAGNOSIS — N186 End stage renal disease: Secondary | ICD-10-CM | POA: Diagnosis not present

## 2020-10-28 DIAGNOSIS — N2581 Secondary hyperparathyroidism of renal origin: Secondary | ICD-10-CM | POA: Diagnosis not present

## 2020-10-28 DIAGNOSIS — D631 Anemia in chronic kidney disease: Secondary | ICD-10-CM | POA: Diagnosis not present

## 2020-10-28 DIAGNOSIS — D689 Coagulation defect, unspecified: Secondary | ICD-10-CM | POA: Diagnosis not present

## 2020-10-31 DIAGNOSIS — N2581 Secondary hyperparathyroidism of renal origin: Secondary | ICD-10-CM | POA: Diagnosis not present

## 2020-10-31 DIAGNOSIS — D631 Anemia in chronic kidney disease: Secondary | ICD-10-CM | POA: Diagnosis not present

## 2020-10-31 DIAGNOSIS — D509 Iron deficiency anemia, unspecified: Secondary | ICD-10-CM | POA: Diagnosis not present

## 2020-10-31 DIAGNOSIS — D689 Coagulation defect, unspecified: Secondary | ICD-10-CM | POA: Diagnosis not present

## 2020-10-31 DIAGNOSIS — E876 Hypokalemia: Secondary | ICD-10-CM | POA: Diagnosis not present

## 2020-10-31 DIAGNOSIS — Z992 Dependence on renal dialysis: Secondary | ICD-10-CM | POA: Diagnosis not present

## 2020-10-31 DIAGNOSIS — N186 End stage renal disease: Secondary | ICD-10-CM | POA: Diagnosis not present

## 2020-11-02 DIAGNOSIS — N2581 Secondary hyperparathyroidism of renal origin: Secondary | ICD-10-CM | POA: Diagnosis not present

## 2020-11-02 DIAGNOSIS — Z992 Dependence on renal dialysis: Secondary | ICD-10-CM | POA: Diagnosis not present

## 2020-11-02 DIAGNOSIS — D631 Anemia in chronic kidney disease: Secondary | ICD-10-CM | POA: Diagnosis not present

## 2020-11-02 DIAGNOSIS — D689 Coagulation defect, unspecified: Secondary | ICD-10-CM | POA: Diagnosis not present

## 2020-11-02 DIAGNOSIS — D509 Iron deficiency anemia, unspecified: Secondary | ICD-10-CM | POA: Diagnosis not present

## 2020-11-02 DIAGNOSIS — N186 End stage renal disease: Secondary | ICD-10-CM | POA: Diagnosis not present

## 2020-11-02 DIAGNOSIS — E876 Hypokalemia: Secondary | ICD-10-CM | POA: Diagnosis not present

## 2020-11-04 DIAGNOSIS — D509 Iron deficiency anemia, unspecified: Secondary | ICD-10-CM | POA: Diagnosis not present

## 2020-11-04 DIAGNOSIS — Z992 Dependence on renal dialysis: Secondary | ICD-10-CM | POA: Diagnosis not present

## 2020-11-04 DIAGNOSIS — N186 End stage renal disease: Secondary | ICD-10-CM | POA: Diagnosis not present

## 2020-11-04 DIAGNOSIS — N2581 Secondary hyperparathyroidism of renal origin: Secondary | ICD-10-CM | POA: Diagnosis not present

## 2020-11-04 DIAGNOSIS — E876 Hypokalemia: Secondary | ICD-10-CM | POA: Diagnosis not present

## 2020-11-04 DIAGNOSIS — D689 Coagulation defect, unspecified: Secondary | ICD-10-CM | POA: Diagnosis not present

## 2020-11-04 DIAGNOSIS — D631 Anemia in chronic kidney disease: Secondary | ICD-10-CM | POA: Diagnosis not present

## 2020-11-07 DIAGNOSIS — E876 Hypokalemia: Secondary | ICD-10-CM | POA: Diagnosis not present

## 2020-11-07 DIAGNOSIS — N186 End stage renal disease: Secondary | ICD-10-CM | POA: Diagnosis not present

## 2020-11-07 DIAGNOSIS — D509 Iron deficiency anemia, unspecified: Secondary | ICD-10-CM | POA: Diagnosis not present

## 2020-11-07 DIAGNOSIS — D689 Coagulation defect, unspecified: Secondary | ICD-10-CM | POA: Diagnosis not present

## 2020-11-07 DIAGNOSIS — N2581 Secondary hyperparathyroidism of renal origin: Secondary | ICD-10-CM | POA: Diagnosis not present

## 2020-11-07 DIAGNOSIS — Z992 Dependence on renal dialysis: Secondary | ICD-10-CM | POA: Diagnosis not present

## 2020-11-07 DIAGNOSIS — D631 Anemia in chronic kidney disease: Secondary | ICD-10-CM | POA: Diagnosis not present

## 2020-11-09 DIAGNOSIS — D509 Iron deficiency anemia, unspecified: Secondary | ICD-10-CM | POA: Diagnosis not present

## 2020-11-09 DIAGNOSIS — Z992 Dependence on renal dialysis: Secondary | ICD-10-CM | POA: Diagnosis not present

## 2020-11-09 DIAGNOSIS — D689 Coagulation defect, unspecified: Secondary | ICD-10-CM | POA: Diagnosis not present

## 2020-11-09 DIAGNOSIS — N186 End stage renal disease: Secondary | ICD-10-CM | POA: Diagnosis not present

## 2020-11-09 DIAGNOSIS — E876 Hypokalemia: Secondary | ICD-10-CM | POA: Diagnosis not present

## 2020-11-09 DIAGNOSIS — D631 Anemia in chronic kidney disease: Secondary | ICD-10-CM | POA: Diagnosis not present

## 2020-11-09 DIAGNOSIS — N2581 Secondary hyperparathyroidism of renal origin: Secondary | ICD-10-CM | POA: Diagnosis not present

## 2020-11-11 DIAGNOSIS — D509 Iron deficiency anemia, unspecified: Secondary | ICD-10-CM | POA: Diagnosis not present

## 2020-11-11 DIAGNOSIS — Z992 Dependence on renal dialysis: Secondary | ICD-10-CM | POA: Diagnosis not present

## 2020-11-11 DIAGNOSIS — D689 Coagulation defect, unspecified: Secondary | ICD-10-CM | POA: Diagnosis not present

## 2020-11-11 DIAGNOSIS — E876 Hypokalemia: Secondary | ICD-10-CM | POA: Diagnosis not present

## 2020-11-11 DIAGNOSIS — D631 Anemia in chronic kidney disease: Secondary | ICD-10-CM | POA: Diagnosis not present

## 2020-11-11 DIAGNOSIS — N2581 Secondary hyperparathyroidism of renal origin: Secondary | ICD-10-CM | POA: Diagnosis not present

## 2020-11-11 DIAGNOSIS — N186 End stage renal disease: Secondary | ICD-10-CM | POA: Diagnosis not present

## 2020-11-14 DIAGNOSIS — D631 Anemia in chronic kidney disease: Secondary | ICD-10-CM | POA: Diagnosis not present

## 2020-11-14 DIAGNOSIS — N2581 Secondary hyperparathyroidism of renal origin: Secondary | ICD-10-CM | POA: Diagnosis not present

## 2020-11-14 DIAGNOSIS — D689 Coagulation defect, unspecified: Secondary | ICD-10-CM | POA: Diagnosis not present

## 2020-11-14 DIAGNOSIS — Z992 Dependence on renal dialysis: Secondary | ICD-10-CM | POA: Diagnosis not present

## 2020-11-14 DIAGNOSIS — N186 End stage renal disease: Secondary | ICD-10-CM | POA: Diagnosis not present

## 2020-11-14 DIAGNOSIS — D509 Iron deficiency anemia, unspecified: Secondary | ICD-10-CM | POA: Diagnosis not present

## 2020-11-14 DIAGNOSIS — E876 Hypokalemia: Secondary | ICD-10-CM | POA: Diagnosis not present

## 2020-11-15 DIAGNOSIS — Z992 Dependence on renal dialysis: Secondary | ICD-10-CM | POA: Diagnosis not present

## 2020-11-15 DIAGNOSIS — N186 End stage renal disease: Secondary | ICD-10-CM | POA: Diagnosis not present

## 2020-11-15 DIAGNOSIS — I129 Hypertensive chronic kidney disease with stage 1 through stage 4 chronic kidney disease, or unspecified chronic kidney disease: Secondary | ICD-10-CM | POA: Diagnosis not present

## 2020-11-16 DIAGNOSIS — D509 Iron deficiency anemia, unspecified: Secondary | ICD-10-CM | POA: Diagnosis not present

## 2020-11-16 DIAGNOSIS — Z992 Dependence on renal dialysis: Secondary | ICD-10-CM | POA: Diagnosis not present

## 2020-11-16 DIAGNOSIS — N186 End stage renal disease: Secondary | ICD-10-CM | POA: Diagnosis not present

## 2020-11-16 DIAGNOSIS — N2581 Secondary hyperparathyroidism of renal origin: Secondary | ICD-10-CM | POA: Diagnosis not present

## 2020-11-16 DIAGNOSIS — D631 Anemia in chronic kidney disease: Secondary | ICD-10-CM | POA: Diagnosis not present

## 2020-11-16 DIAGNOSIS — D689 Coagulation defect, unspecified: Secondary | ICD-10-CM | POA: Diagnosis not present

## 2020-11-18 DIAGNOSIS — D689 Coagulation defect, unspecified: Secondary | ICD-10-CM | POA: Diagnosis not present

## 2020-11-18 DIAGNOSIS — D509 Iron deficiency anemia, unspecified: Secondary | ICD-10-CM | POA: Diagnosis not present

## 2020-11-18 DIAGNOSIS — N2581 Secondary hyperparathyroidism of renal origin: Secondary | ICD-10-CM | POA: Diagnosis not present

## 2020-11-18 DIAGNOSIS — N186 End stage renal disease: Secondary | ICD-10-CM | POA: Diagnosis not present

## 2020-11-18 DIAGNOSIS — Z992 Dependence on renal dialysis: Secondary | ICD-10-CM | POA: Diagnosis not present

## 2020-11-18 DIAGNOSIS — D631 Anemia in chronic kidney disease: Secondary | ICD-10-CM | POA: Diagnosis not present

## 2020-11-21 DIAGNOSIS — D689 Coagulation defect, unspecified: Secondary | ICD-10-CM | POA: Diagnosis not present

## 2020-11-21 DIAGNOSIS — D509 Iron deficiency anemia, unspecified: Secondary | ICD-10-CM | POA: Diagnosis not present

## 2020-11-21 DIAGNOSIS — Z992 Dependence on renal dialysis: Secondary | ICD-10-CM | POA: Diagnosis not present

## 2020-11-21 DIAGNOSIS — N186 End stage renal disease: Secondary | ICD-10-CM | POA: Diagnosis not present

## 2020-11-21 DIAGNOSIS — N2581 Secondary hyperparathyroidism of renal origin: Secondary | ICD-10-CM | POA: Diagnosis not present

## 2020-11-21 DIAGNOSIS — D631 Anemia in chronic kidney disease: Secondary | ICD-10-CM | POA: Diagnosis not present

## 2020-11-23 DIAGNOSIS — Z992 Dependence on renal dialysis: Secondary | ICD-10-CM | POA: Diagnosis not present

## 2020-11-23 DIAGNOSIS — D631 Anemia in chronic kidney disease: Secondary | ICD-10-CM | POA: Diagnosis not present

## 2020-11-23 DIAGNOSIS — D509 Iron deficiency anemia, unspecified: Secondary | ICD-10-CM | POA: Diagnosis not present

## 2020-11-23 DIAGNOSIS — N2581 Secondary hyperparathyroidism of renal origin: Secondary | ICD-10-CM | POA: Diagnosis not present

## 2020-11-23 DIAGNOSIS — D689 Coagulation defect, unspecified: Secondary | ICD-10-CM | POA: Diagnosis not present

## 2020-11-23 DIAGNOSIS — N186 End stage renal disease: Secondary | ICD-10-CM | POA: Diagnosis not present

## 2020-11-25 DIAGNOSIS — D509 Iron deficiency anemia, unspecified: Secondary | ICD-10-CM | POA: Diagnosis not present

## 2020-11-25 DIAGNOSIS — D631 Anemia in chronic kidney disease: Secondary | ICD-10-CM | POA: Diagnosis not present

## 2020-11-25 DIAGNOSIS — Z992 Dependence on renal dialysis: Secondary | ICD-10-CM | POA: Diagnosis not present

## 2020-11-25 DIAGNOSIS — D689 Coagulation defect, unspecified: Secondary | ICD-10-CM | POA: Diagnosis not present

## 2020-11-25 DIAGNOSIS — N2581 Secondary hyperparathyroidism of renal origin: Secondary | ICD-10-CM | POA: Diagnosis not present

## 2020-11-25 DIAGNOSIS — N186 End stage renal disease: Secondary | ICD-10-CM | POA: Diagnosis not present

## 2020-11-28 DIAGNOSIS — D631 Anemia in chronic kidney disease: Secondary | ICD-10-CM | POA: Diagnosis not present

## 2020-11-28 DIAGNOSIS — N2581 Secondary hyperparathyroidism of renal origin: Secondary | ICD-10-CM | POA: Diagnosis not present

## 2020-11-28 DIAGNOSIS — Z992 Dependence on renal dialysis: Secondary | ICD-10-CM | POA: Diagnosis not present

## 2020-11-28 DIAGNOSIS — N186 End stage renal disease: Secondary | ICD-10-CM | POA: Diagnosis not present

## 2020-11-28 DIAGNOSIS — D689 Coagulation defect, unspecified: Secondary | ICD-10-CM | POA: Diagnosis not present

## 2020-11-28 DIAGNOSIS — D509 Iron deficiency anemia, unspecified: Secondary | ICD-10-CM | POA: Diagnosis not present

## 2020-11-30 DIAGNOSIS — D509 Iron deficiency anemia, unspecified: Secondary | ICD-10-CM | POA: Diagnosis not present

## 2020-11-30 DIAGNOSIS — D631 Anemia in chronic kidney disease: Secondary | ICD-10-CM | POA: Diagnosis not present

## 2020-11-30 DIAGNOSIS — N2581 Secondary hyperparathyroidism of renal origin: Secondary | ICD-10-CM | POA: Diagnosis not present

## 2020-11-30 DIAGNOSIS — N186 End stage renal disease: Secondary | ICD-10-CM | POA: Diagnosis not present

## 2020-11-30 DIAGNOSIS — Z992 Dependence on renal dialysis: Secondary | ICD-10-CM | POA: Diagnosis not present

## 2020-11-30 DIAGNOSIS — D689 Coagulation defect, unspecified: Secondary | ICD-10-CM | POA: Diagnosis not present

## 2020-12-02 DIAGNOSIS — N186 End stage renal disease: Secondary | ICD-10-CM | POA: Diagnosis not present

## 2020-12-02 DIAGNOSIS — D631 Anemia in chronic kidney disease: Secondary | ICD-10-CM | POA: Diagnosis not present

## 2020-12-02 DIAGNOSIS — N2581 Secondary hyperparathyroidism of renal origin: Secondary | ICD-10-CM | POA: Diagnosis not present

## 2020-12-02 DIAGNOSIS — Z992 Dependence on renal dialysis: Secondary | ICD-10-CM | POA: Diagnosis not present

## 2020-12-02 DIAGNOSIS — D509 Iron deficiency anemia, unspecified: Secondary | ICD-10-CM | POA: Diagnosis not present

## 2020-12-02 DIAGNOSIS — D689 Coagulation defect, unspecified: Secondary | ICD-10-CM | POA: Diagnosis not present

## 2020-12-05 DIAGNOSIS — D689 Coagulation defect, unspecified: Secondary | ICD-10-CM | POA: Diagnosis not present

## 2020-12-05 DIAGNOSIS — Z992 Dependence on renal dialysis: Secondary | ICD-10-CM | POA: Diagnosis not present

## 2020-12-05 DIAGNOSIS — D631 Anemia in chronic kidney disease: Secondary | ICD-10-CM | POA: Diagnosis not present

## 2020-12-05 DIAGNOSIS — N2581 Secondary hyperparathyroidism of renal origin: Secondary | ICD-10-CM | POA: Diagnosis not present

## 2020-12-05 DIAGNOSIS — D509 Iron deficiency anemia, unspecified: Secondary | ICD-10-CM | POA: Diagnosis not present

## 2020-12-05 DIAGNOSIS — N186 End stage renal disease: Secondary | ICD-10-CM | POA: Diagnosis not present

## 2020-12-07 DIAGNOSIS — D631 Anemia in chronic kidney disease: Secondary | ICD-10-CM | POA: Diagnosis not present

## 2020-12-07 DIAGNOSIS — D509 Iron deficiency anemia, unspecified: Secondary | ICD-10-CM | POA: Diagnosis not present

## 2020-12-07 DIAGNOSIS — N186 End stage renal disease: Secondary | ICD-10-CM | POA: Diagnosis not present

## 2020-12-07 DIAGNOSIS — D689 Coagulation defect, unspecified: Secondary | ICD-10-CM | POA: Diagnosis not present

## 2020-12-07 DIAGNOSIS — Z992 Dependence on renal dialysis: Secondary | ICD-10-CM | POA: Diagnosis not present

## 2020-12-07 DIAGNOSIS — N2581 Secondary hyperparathyroidism of renal origin: Secondary | ICD-10-CM | POA: Diagnosis not present

## 2020-12-09 DIAGNOSIS — D631 Anemia in chronic kidney disease: Secondary | ICD-10-CM | POA: Diagnosis not present

## 2020-12-09 DIAGNOSIS — N2581 Secondary hyperparathyroidism of renal origin: Secondary | ICD-10-CM | POA: Diagnosis not present

## 2020-12-09 DIAGNOSIS — D689 Coagulation defect, unspecified: Secondary | ICD-10-CM | POA: Diagnosis not present

## 2020-12-09 DIAGNOSIS — N186 End stage renal disease: Secondary | ICD-10-CM | POA: Diagnosis not present

## 2020-12-09 DIAGNOSIS — D509 Iron deficiency anemia, unspecified: Secondary | ICD-10-CM | POA: Diagnosis not present

## 2020-12-09 DIAGNOSIS — Z992 Dependence on renal dialysis: Secondary | ICD-10-CM | POA: Diagnosis not present

## 2020-12-12 DIAGNOSIS — D631 Anemia in chronic kidney disease: Secondary | ICD-10-CM | POA: Diagnosis not present

## 2020-12-12 DIAGNOSIS — D689 Coagulation defect, unspecified: Secondary | ICD-10-CM | POA: Diagnosis not present

## 2020-12-12 DIAGNOSIS — N2581 Secondary hyperparathyroidism of renal origin: Secondary | ICD-10-CM | POA: Diagnosis not present

## 2020-12-12 DIAGNOSIS — D509 Iron deficiency anemia, unspecified: Secondary | ICD-10-CM | POA: Diagnosis not present

## 2020-12-12 DIAGNOSIS — N186 End stage renal disease: Secondary | ICD-10-CM | POA: Diagnosis not present

## 2020-12-12 DIAGNOSIS — Z992 Dependence on renal dialysis: Secondary | ICD-10-CM | POA: Diagnosis not present

## 2020-12-14 DIAGNOSIS — N2581 Secondary hyperparathyroidism of renal origin: Secondary | ICD-10-CM | POA: Diagnosis not present

## 2020-12-14 DIAGNOSIS — D689 Coagulation defect, unspecified: Secondary | ICD-10-CM | POA: Diagnosis not present

## 2020-12-14 DIAGNOSIS — Z992 Dependence on renal dialysis: Secondary | ICD-10-CM | POA: Diagnosis not present

## 2020-12-14 DIAGNOSIS — N186 End stage renal disease: Secondary | ICD-10-CM | POA: Diagnosis not present

## 2020-12-14 DIAGNOSIS — D631 Anemia in chronic kidney disease: Secondary | ICD-10-CM | POA: Diagnosis not present

## 2020-12-14 DIAGNOSIS — D509 Iron deficiency anemia, unspecified: Secondary | ICD-10-CM | POA: Diagnosis not present

## 2020-12-15 DIAGNOSIS — I129 Hypertensive chronic kidney disease with stage 1 through stage 4 chronic kidney disease, or unspecified chronic kidney disease: Secondary | ICD-10-CM | POA: Diagnosis not present

## 2020-12-15 DIAGNOSIS — N186 End stage renal disease: Secondary | ICD-10-CM | POA: Diagnosis not present

## 2020-12-15 DIAGNOSIS — Z992 Dependence on renal dialysis: Secondary | ICD-10-CM | POA: Diagnosis not present

## 2020-12-16 DIAGNOSIS — N2581 Secondary hyperparathyroidism of renal origin: Secondary | ICD-10-CM | POA: Diagnosis not present

## 2020-12-16 DIAGNOSIS — D689 Coagulation defect, unspecified: Secondary | ICD-10-CM | POA: Diagnosis not present

## 2020-12-16 DIAGNOSIS — N186 End stage renal disease: Secondary | ICD-10-CM | POA: Diagnosis not present

## 2020-12-16 DIAGNOSIS — Z992 Dependence on renal dialysis: Secondary | ICD-10-CM | POA: Diagnosis not present

## 2020-12-16 DIAGNOSIS — D631 Anemia in chronic kidney disease: Secondary | ICD-10-CM | POA: Diagnosis not present

## 2020-12-19 DIAGNOSIS — Z992 Dependence on renal dialysis: Secondary | ICD-10-CM | POA: Diagnosis not present

## 2020-12-19 DIAGNOSIS — N186 End stage renal disease: Secondary | ICD-10-CM | POA: Diagnosis not present

## 2020-12-19 DIAGNOSIS — D689 Coagulation defect, unspecified: Secondary | ICD-10-CM | POA: Diagnosis not present

## 2020-12-19 DIAGNOSIS — D631 Anemia in chronic kidney disease: Secondary | ICD-10-CM | POA: Diagnosis not present

## 2020-12-19 DIAGNOSIS — N2581 Secondary hyperparathyroidism of renal origin: Secondary | ICD-10-CM | POA: Diagnosis not present

## 2020-12-21 DIAGNOSIS — Z992 Dependence on renal dialysis: Secondary | ICD-10-CM | POA: Diagnosis not present

## 2020-12-21 DIAGNOSIS — D689 Coagulation defect, unspecified: Secondary | ICD-10-CM | POA: Diagnosis not present

## 2020-12-21 DIAGNOSIS — N2581 Secondary hyperparathyroidism of renal origin: Secondary | ICD-10-CM | POA: Diagnosis not present

## 2020-12-21 DIAGNOSIS — D631 Anemia in chronic kidney disease: Secondary | ICD-10-CM | POA: Diagnosis not present

## 2020-12-21 DIAGNOSIS — N186 End stage renal disease: Secondary | ICD-10-CM | POA: Diagnosis not present

## 2020-12-23 DIAGNOSIS — D689 Coagulation defect, unspecified: Secondary | ICD-10-CM | POA: Diagnosis not present

## 2020-12-23 DIAGNOSIS — N186 End stage renal disease: Secondary | ICD-10-CM | POA: Diagnosis not present

## 2020-12-23 DIAGNOSIS — D631 Anemia in chronic kidney disease: Secondary | ICD-10-CM | POA: Diagnosis not present

## 2020-12-23 DIAGNOSIS — N2581 Secondary hyperparathyroidism of renal origin: Secondary | ICD-10-CM | POA: Diagnosis not present

## 2020-12-23 DIAGNOSIS — Z992 Dependence on renal dialysis: Secondary | ICD-10-CM | POA: Diagnosis not present

## 2020-12-25 DIAGNOSIS — N2581 Secondary hyperparathyroidism of renal origin: Secondary | ICD-10-CM | POA: Diagnosis not present

## 2020-12-25 DIAGNOSIS — N186 End stage renal disease: Secondary | ICD-10-CM | POA: Diagnosis not present

## 2020-12-25 DIAGNOSIS — Z992 Dependence on renal dialysis: Secondary | ICD-10-CM | POA: Diagnosis not present

## 2020-12-25 DIAGNOSIS — D689 Coagulation defect, unspecified: Secondary | ICD-10-CM | POA: Diagnosis not present

## 2020-12-25 DIAGNOSIS — D631 Anemia in chronic kidney disease: Secondary | ICD-10-CM | POA: Diagnosis not present

## 2020-12-28 DIAGNOSIS — D689 Coagulation defect, unspecified: Secondary | ICD-10-CM | POA: Diagnosis not present

## 2020-12-28 DIAGNOSIS — N2581 Secondary hyperparathyroidism of renal origin: Secondary | ICD-10-CM | POA: Diagnosis not present

## 2020-12-28 DIAGNOSIS — Z992 Dependence on renal dialysis: Secondary | ICD-10-CM | POA: Diagnosis not present

## 2020-12-28 DIAGNOSIS — N186 End stage renal disease: Secondary | ICD-10-CM | POA: Diagnosis not present

## 2020-12-28 DIAGNOSIS — D631 Anemia in chronic kidney disease: Secondary | ICD-10-CM | POA: Diagnosis not present

## 2020-12-30 DIAGNOSIS — D631 Anemia in chronic kidney disease: Secondary | ICD-10-CM | POA: Diagnosis not present

## 2020-12-30 DIAGNOSIS — D689 Coagulation defect, unspecified: Secondary | ICD-10-CM | POA: Diagnosis not present

## 2020-12-30 DIAGNOSIS — N2581 Secondary hyperparathyroidism of renal origin: Secondary | ICD-10-CM | POA: Diagnosis not present

## 2020-12-30 DIAGNOSIS — Z992 Dependence on renal dialysis: Secondary | ICD-10-CM | POA: Diagnosis not present

## 2020-12-30 DIAGNOSIS — N186 End stage renal disease: Secondary | ICD-10-CM | POA: Diagnosis not present

## 2021-01-02 DIAGNOSIS — D631 Anemia in chronic kidney disease: Secondary | ICD-10-CM | POA: Diagnosis not present

## 2021-01-02 DIAGNOSIS — D689 Coagulation defect, unspecified: Secondary | ICD-10-CM | POA: Diagnosis not present

## 2021-01-02 DIAGNOSIS — Z992 Dependence on renal dialysis: Secondary | ICD-10-CM | POA: Diagnosis not present

## 2021-01-02 DIAGNOSIS — N186 End stage renal disease: Secondary | ICD-10-CM | POA: Diagnosis not present

## 2021-01-02 DIAGNOSIS — N2581 Secondary hyperparathyroidism of renal origin: Secondary | ICD-10-CM | POA: Diagnosis not present

## 2021-01-04 DIAGNOSIS — D689 Coagulation defect, unspecified: Secondary | ICD-10-CM | POA: Diagnosis not present

## 2021-01-04 DIAGNOSIS — N2581 Secondary hyperparathyroidism of renal origin: Secondary | ICD-10-CM | POA: Diagnosis not present

## 2021-01-04 DIAGNOSIS — N186 End stage renal disease: Secondary | ICD-10-CM | POA: Diagnosis not present

## 2021-01-04 DIAGNOSIS — Z992 Dependence on renal dialysis: Secondary | ICD-10-CM | POA: Diagnosis not present

## 2021-01-04 DIAGNOSIS — D631 Anemia in chronic kidney disease: Secondary | ICD-10-CM | POA: Diagnosis not present

## 2021-01-06 DIAGNOSIS — D689 Coagulation defect, unspecified: Secondary | ICD-10-CM | POA: Diagnosis not present

## 2021-01-06 DIAGNOSIS — D631 Anemia in chronic kidney disease: Secondary | ICD-10-CM | POA: Diagnosis not present

## 2021-01-06 DIAGNOSIS — N2581 Secondary hyperparathyroidism of renal origin: Secondary | ICD-10-CM | POA: Diagnosis not present

## 2021-01-06 DIAGNOSIS — Z992 Dependence on renal dialysis: Secondary | ICD-10-CM | POA: Diagnosis not present

## 2021-01-06 DIAGNOSIS — N186 End stage renal disease: Secondary | ICD-10-CM | POA: Diagnosis not present

## 2021-01-09 DIAGNOSIS — N2581 Secondary hyperparathyroidism of renal origin: Secondary | ICD-10-CM | POA: Diagnosis not present

## 2021-01-09 DIAGNOSIS — N186 End stage renal disease: Secondary | ICD-10-CM | POA: Diagnosis not present

## 2021-01-09 DIAGNOSIS — D631 Anemia in chronic kidney disease: Secondary | ICD-10-CM | POA: Diagnosis not present

## 2021-01-09 DIAGNOSIS — D689 Coagulation defect, unspecified: Secondary | ICD-10-CM | POA: Diagnosis not present

## 2021-01-09 DIAGNOSIS — Z992 Dependence on renal dialysis: Secondary | ICD-10-CM | POA: Diagnosis not present

## 2021-01-11 DIAGNOSIS — N2581 Secondary hyperparathyroidism of renal origin: Secondary | ICD-10-CM | POA: Diagnosis not present

## 2021-01-11 DIAGNOSIS — Z992 Dependence on renal dialysis: Secondary | ICD-10-CM | POA: Diagnosis not present

## 2021-01-11 DIAGNOSIS — D689 Coagulation defect, unspecified: Secondary | ICD-10-CM | POA: Diagnosis not present

## 2021-01-11 DIAGNOSIS — N186 End stage renal disease: Secondary | ICD-10-CM | POA: Diagnosis not present

## 2021-01-11 DIAGNOSIS — D631 Anemia in chronic kidney disease: Secondary | ICD-10-CM | POA: Diagnosis not present

## 2021-01-13 DIAGNOSIS — D689 Coagulation defect, unspecified: Secondary | ICD-10-CM | POA: Diagnosis not present

## 2021-01-13 DIAGNOSIS — Z992 Dependence on renal dialysis: Secondary | ICD-10-CM | POA: Diagnosis not present

## 2021-01-13 DIAGNOSIS — D631 Anemia in chronic kidney disease: Secondary | ICD-10-CM | POA: Diagnosis not present

## 2021-01-13 DIAGNOSIS — N2581 Secondary hyperparathyroidism of renal origin: Secondary | ICD-10-CM | POA: Diagnosis not present

## 2021-01-13 DIAGNOSIS — N186 End stage renal disease: Secondary | ICD-10-CM | POA: Diagnosis not present

## 2021-01-15 DIAGNOSIS — Z992 Dependence on renal dialysis: Secondary | ICD-10-CM | POA: Diagnosis not present

## 2021-01-15 DIAGNOSIS — I129 Hypertensive chronic kidney disease with stage 1 through stage 4 chronic kidney disease, or unspecified chronic kidney disease: Secondary | ICD-10-CM | POA: Diagnosis not present

## 2021-01-15 DIAGNOSIS — N186 End stage renal disease: Secondary | ICD-10-CM | POA: Diagnosis not present

## 2021-01-16 DIAGNOSIS — D689 Coagulation defect, unspecified: Secondary | ICD-10-CM | POA: Diagnosis not present

## 2021-01-16 DIAGNOSIS — Z992 Dependence on renal dialysis: Secondary | ICD-10-CM | POA: Diagnosis not present

## 2021-01-16 DIAGNOSIS — N186 End stage renal disease: Secondary | ICD-10-CM | POA: Diagnosis not present

## 2021-01-16 DIAGNOSIS — N2581 Secondary hyperparathyroidism of renal origin: Secondary | ICD-10-CM | POA: Diagnosis not present

## 2021-01-16 DIAGNOSIS — D631 Anemia in chronic kidney disease: Secondary | ICD-10-CM | POA: Diagnosis not present

## 2021-01-18 DIAGNOSIS — D689 Coagulation defect, unspecified: Secondary | ICD-10-CM | POA: Diagnosis not present

## 2021-01-18 DIAGNOSIS — N2581 Secondary hyperparathyroidism of renal origin: Secondary | ICD-10-CM | POA: Diagnosis not present

## 2021-01-18 DIAGNOSIS — D631 Anemia in chronic kidney disease: Secondary | ICD-10-CM | POA: Diagnosis not present

## 2021-01-18 DIAGNOSIS — Z992 Dependence on renal dialysis: Secondary | ICD-10-CM | POA: Diagnosis not present

## 2021-01-18 DIAGNOSIS — N186 End stage renal disease: Secondary | ICD-10-CM | POA: Diagnosis not present

## 2021-01-20 DIAGNOSIS — D631 Anemia in chronic kidney disease: Secondary | ICD-10-CM | POA: Diagnosis not present

## 2021-01-20 DIAGNOSIS — Z992 Dependence on renal dialysis: Secondary | ICD-10-CM | POA: Diagnosis not present

## 2021-01-20 DIAGNOSIS — N186 End stage renal disease: Secondary | ICD-10-CM | POA: Diagnosis not present

## 2021-01-20 DIAGNOSIS — D689 Coagulation defect, unspecified: Secondary | ICD-10-CM | POA: Diagnosis not present

## 2021-01-20 DIAGNOSIS — N2581 Secondary hyperparathyroidism of renal origin: Secondary | ICD-10-CM | POA: Diagnosis not present

## 2021-01-23 DIAGNOSIS — D631 Anemia in chronic kidney disease: Secondary | ICD-10-CM | POA: Diagnosis not present

## 2021-01-23 DIAGNOSIS — N186 End stage renal disease: Secondary | ICD-10-CM | POA: Diagnosis not present

## 2021-01-23 DIAGNOSIS — N2581 Secondary hyperparathyroidism of renal origin: Secondary | ICD-10-CM | POA: Diagnosis not present

## 2021-01-23 DIAGNOSIS — D689 Coagulation defect, unspecified: Secondary | ICD-10-CM | POA: Diagnosis not present

## 2021-01-23 DIAGNOSIS — Z992 Dependence on renal dialysis: Secondary | ICD-10-CM | POA: Diagnosis not present

## 2021-01-25 DIAGNOSIS — N186 End stage renal disease: Secondary | ICD-10-CM | POA: Diagnosis not present

## 2021-01-25 DIAGNOSIS — D631 Anemia in chronic kidney disease: Secondary | ICD-10-CM | POA: Diagnosis not present

## 2021-01-25 DIAGNOSIS — N2581 Secondary hyperparathyroidism of renal origin: Secondary | ICD-10-CM | POA: Diagnosis not present

## 2021-01-25 DIAGNOSIS — Z992 Dependence on renal dialysis: Secondary | ICD-10-CM | POA: Diagnosis not present

## 2021-01-25 DIAGNOSIS — D689 Coagulation defect, unspecified: Secondary | ICD-10-CM | POA: Diagnosis not present

## 2021-01-27 DIAGNOSIS — N2581 Secondary hyperparathyroidism of renal origin: Secondary | ICD-10-CM | POA: Diagnosis not present

## 2021-01-27 DIAGNOSIS — D689 Coagulation defect, unspecified: Secondary | ICD-10-CM | POA: Diagnosis not present

## 2021-01-27 DIAGNOSIS — N186 End stage renal disease: Secondary | ICD-10-CM | POA: Diagnosis not present

## 2021-01-27 DIAGNOSIS — Z992 Dependence on renal dialysis: Secondary | ICD-10-CM | POA: Diagnosis not present

## 2021-01-27 DIAGNOSIS — D631 Anemia in chronic kidney disease: Secondary | ICD-10-CM | POA: Diagnosis not present

## 2021-01-30 DIAGNOSIS — N2581 Secondary hyperparathyroidism of renal origin: Secondary | ICD-10-CM | POA: Diagnosis not present

## 2021-01-30 DIAGNOSIS — N186 End stage renal disease: Secondary | ICD-10-CM | POA: Diagnosis not present

## 2021-01-30 DIAGNOSIS — D631 Anemia in chronic kidney disease: Secondary | ICD-10-CM | POA: Diagnosis not present

## 2021-01-30 DIAGNOSIS — Z992 Dependence on renal dialysis: Secondary | ICD-10-CM | POA: Diagnosis not present

## 2021-01-30 DIAGNOSIS — D689 Coagulation defect, unspecified: Secondary | ICD-10-CM | POA: Diagnosis not present

## 2021-02-01 DIAGNOSIS — D689 Coagulation defect, unspecified: Secondary | ICD-10-CM | POA: Diagnosis not present

## 2021-02-01 DIAGNOSIS — D631 Anemia in chronic kidney disease: Secondary | ICD-10-CM | POA: Diagnosis not present

## 2021-02-01 DIAGNOSIS — N2581 Secondary hyperparathyroidism of renal origin: Secondary | ICD-10-CM | POA: Diagnosis not present

## 2021-02-01 DIAGNOSIS — N186 End stage renal disease: Secondary | ICD-10-CM | POA: Diagnosis not present

## 2021-02-01 DIAGNOSIS — Z992 Dependence on renal dialysis: Secondary | ICD-10-CM | POA: Diagnosis not present

## 2021-02-03 DIAGNOSIS — N186 End stage renal disease: Secondary | ICD-10-CM | POA: Diagnosis not present

## 2021-02-03 DIAGNOSIS — N2581 Secondary hyperparathyroidism of renal origin: Secondary | ICD-10-CM | POA: Diagnosis not present

## 2021-02-03 DIAGNOSIS — D689 Coagulation defect, unspecified: Secondary | ICD-10-CM | POA: Diagnosis not present

## 2021-02-03 DIAGNOSIS — Z992 Dependence on renal dialysis: Secondary | ICD-10-CM | POA: Diagnosis not present

## 2021-02-03 DIAGNOSIS — D631 Anemia in chronic kidney disease: Secondary | ICD-10-CM | POA: Diagnosis not present

## 2021-02-05 DIAGNOSIS — D689 Coagulation defect, unspecified: Secondary | ICD-10-CM | POA: Diagnosis not present

## 2021-02-05 DIAGNOSIS — D631 Anemia in chronic kidney disease: Secondary | ICD-10-CM | POA: Diagnosis not present

## 2021-02-05 DIAGNOSIS — N186 End stage renal disease: Secondary | ICD-10-CM | POA: Diagnosis not present

## 2021-02-05 DIAGNOSIS — N2581 Secondary hyperparathyroidism of renal origin: Secondary | ICD-10-CM | POA: Diagnosis not present

## 2021-02-05 DIAGNOSIS — Z992 Dependence on renal dialysis: Secondary | ICD-10-CM | POA: Diagnosis not present

## 2021-02-07 DIAGNOSIS — Z992 Dependence on renal dialysis: Secondary | ICD-10-CM | POA: Diagnosis not present

## 2021-02-07 DIAGNOSIS — D631 Anemia in chronic kidney disease: Secondary | ICD-10-CM | POA: Diagnosis not present

## 2021-02-07 DIAGNOSIS — D689 Coagulation defect, unspecified: Secondary | ICD-10-CM | POA: Diagnosis not present

## 2021-02-07 DIAGNOSIS — N186 End stage renal disease: Secondary | ICD-10-CM | POA: Diagnosis not present

## 2021-02-07 DIAGNOSIS — N2581 Secondary hyperparathyroidism of renal origin: Secondary | ICD-10-CM | POA: Diagnosis not present

## 2021-02-10 DIAGNOSIS — D631 Anemia in chronic kidney disease: Secondary | ICD-10-CM | POA: Diagnosis not present

## 2021-02-10 DIAGNOSIS — Z992 Dependence on renal dialysis: Secondary | ICD-10-CM | POA: Diagnosis not present

## 2021-02-10 DIAGNOSIS — N186 End stage renal disease: Secondary | ICD-10-CM | POA: Diagnosis not present

## 2021-02-10 DIAGNOSIS — N2581 Secondary hyperparathyroidism of renal origin: Secondary | ICD-10-CM | POA: Diagnosis not present

## 2021-02-10 DIAGNOSIS — D689 Coagulation defect, unspecified: Secondary | ICD-10-CM | POA: Diagnosis not present

## 2021-02-12 DIAGNOSIS — N2581 Secondary hyperparathyroidism of renal origin: Secondary | ICD-10-CM | POA: Diagnosis not present

## 2021-02-12 DIAGNOSIS — N186 End stage renal disease: Secondary | ICD-10-CM | POA: Diagnosis not present

## 2021-02-12 DIAGNOSIS — D631 Anemia in chronic kidney disease: Secondary | ICD-10-CM | POA: Diagnosis not present

## 2021-02-12 DIAGNOSIS — D689 Coagulation defect, unspecified: Secondary | ICD-10-CM | POA: Diagnosis not present

## 2021-02-12 DIAGNOSIS — Z992 Dependence on renal dialysis: Secondary | ICD-10-CM | POA: Diagnosis not present

## 2021-02-14 DIAGNOSIS — D689 Coagulation defect, unspecified: Secondary | ICD-10-CM | POA: Diagnosis not present

## 2021-02-14 DIAGNOSIS — Z992 Dependence on renal dialysis: Secondary | ICD-10-CM | POA: Diagnosis not present

## 2021-02-14 DIAGNOSIS — I129 Hypertensive chronic kidney disease with stage 1 through stage 4 chronic kidney disease, or unspecified chronic kidney disease: Secondary | ICD-10-CM | POA: Diagnosis not present

## 2021-02-14 DIAGNOSIS — N186 End stage renal disease: Secondary | ICD-10-CM | POA: Diagnosis not present

## 2021-02-14 DIAGNOSIS — D631 Anemia in chronic kidney disease: Secondary | ICD-10-CM | POA: Diagnosis not present

## 2021-02-14 DIAGNOSIS — N2581 Secondary hyperparathyroidism of renal origin: Secondary | ICD-10-CM | POA: Diagnosis not present

## 2021-02-16 DIAGNOSIS — Z992 Dependence on renal dialysis: Secondary | ICD-10-CM | POA: Diagnosis not present

## 2021-02-16 DIAGNOSIS — D631 Anemia in chronic kidney disease: Secondary | ICD-10-CM | POA: Diagnosis not present

## 2021-02-16 DIAGNOSIS — N186 End stage renal disease: Secondary | ICD-10-CM | POA: Diagnosis not present

## 2021-02-16 DIAGNOSIS — N2581 Secondary hyperparathyroidism of renal origin: Secondary | ICD-10-CM | POA: Diagnosis not present

## 2021-02-16 DIAGNOSIS — D689 Coagulation defect, unspecified: Secondary | ICD-10-CM | POA: Diagnosis not present

## 2021-02-19 DIAGNOSIS — N186 End stage renal disease: Secondary | ICD-10-CM | POA: Diagnosis not present

## 2021-02-19 DIAGNOSIS — N2581 Secondary hyperparathyroidism of renal origin: Secondary | ICD-10-CM | POA: Diagnosis not present

## 2021-02-19 DIAGNOSIS — D631 Anemia in chronic kidney disease: Secondary | ICD-10-CM | POA: Diagnosis not present

## 2021-02-19 DIAGNOSIS — D689 Coagulation defect, unspecified: Secondary | ICD-10-CM | POA: Diagnosis not present

## 2021-02-19 DIAGNOSIS — Z992 Dependence on renal dialysis: Secondary | ICD-10-CM | POA: Diagnosis not present

## 2021-02-21 DIAGNOSIS — N2581 Secondary hyperparathyroidism of renal origin: Secondary | ICD-10-CM | POA: Diagnosis not present

## 2021-02-21 DIAGNOSIS — D689 Coagulation defect, unspecified: Secondary | ICD-10-CM | POA: Diagnosis not present

## 2021-02-21 DIAGNOSIS — N186 End stage renal disease: Secondary | ICD-10-CM | POA: Diagnosis not present

## 2021-02-21 DIAGNOSIS — D631 Anemia in chronic kidney disease: Secondary | ICD-10-CM | POA: Diagnosis not present

## 2021-02-21 DIAGNOSIS — Z992 Dependence on renal dialysis: Secondary | ICD-10-CM | POA: Diagnosis not present

## 2021-02-23 DIAGNOSIS — N2581 Secondary hyperparathyroidism of renal origin: Secondary | ICD-10-CM | POA: Diagnosis not present

## 2021-02-23 DIAGNOSIS — D631 Anemia in chronic kidney disease: Secondary | ICD-10-CM | POA: Diagnosis not present

## 2021-02-23 DIAGNOSIS — Z992 Dependence on renal dialysis: Secondary | ICD-10-CM | POA: Diagnosis not present

## 2021-02-23 DIAGNOSIS — N186 End stage renal disease: Secondary | ICD-10-CM | POA: Diagnosis not present

## 2021-02-23 DIAGNOSIS — D689 Coagulation defect, unspecified: Secondary | ICD-10-CM | POA: Diagnosis not present

## 2021-02-26 DIAGNOSIS — Z992 Dependence on renal dialysis: Secondary | ICD-10-CM | POA: Diagnosis not present

## 2021-02-26 DIAGNOSIS — D631 Anemia in chronic kidney disease: Secondary | ICD-10-CM | POA: Diagnosis not present

## 2021-02-26 DIAGNOSIS — N2581 Secondary hyperparathyroidism of renal origin: Secondary | ICD-10-CM | POA: Diagnosis not present

## 2021-02-26 DIAGNOSIS — D689 Coagulation defect, unspecified: Secondary | ICD-10-CM | POA: Diagnosis not present

## 2021-02-26 DIAGNOSIS — N186 End stage renal disease: Secondary | ICD-10-CM | POA: Diagnosis not present

## 2021-02-28 DIAGNOSIS — N186 End stage renal disease: Secondary | ICD-10-CM | POA: Diagnosis not present

## 2021-02-28 DIAGNOSIS — D631 Anemia in chronic kidney disease: Secondary | ICD-10-CM | POA: Diagnosis not present

## 2021-02-28 DIAGNOSIS — N2581 Secondary hyperparathyroidism of renal origin: Secondary | ICD-10-CM | POA: Diagnosis not present

## 2021-02-28 DIAGNOSIS — D689 Coagulation defect, unspecified: Secondary | ICD-10-CM | POA: Diagnosis not present

## 2021-02-28 DIAGNOSIS — Z992 Dependence on renal dialysis: Secondary | ICD-10-CM | POA: Diagnosis not present

## 2021-03-02 DIAGNOSIS — Z992 Dependence on renal dialysis: Secondary | ICD-10-CM | POA: Diagnosis not present

## 2021-03-02 DIAGNOSIS — D689 Coagulation defect, unspecified: Secondary | ICD-10-CM | POA: Diagnosis not present

## 2021-03-02 DIAGNOSIS — N186 End stage renal disease: Secondary | ICD-10-CM | POA: Diagnosis not present

## 2021-03-02 DIAGNOSIS — D631 Anemia in chronic kidney disease: Secondary | ICD-10-CM | POA: Diagnosis not present

## 2021-03-02 DIAGNOSIS — N2581 Secondary hyperparathyroidism of renal origin: Secondary | ICD-10-CM | POA: Diagnosis not present

## 2021-03-05 DIAGNOSIS — D689 Coagulation defect, unspecified: Secondary | ICD-10-CM | POA: Diagnosis not present

## 2021-03-05 DIAGNOSIS — Z992 Dependence on renal dialysis: Secondary | ICD-10-CM | POA: Diagnosis not present

## 2021-03-05 DIAGNOSIS — D631 Anemia in chronic kidney disease: Secondary | ICD-10-CM | POA: Diagnosis not present

## 2021-03-05 DIAGNOSIS — N186 End stage renal disease: Secondary | ICD-10-CM | POA: Diagnosis not present

## 2021-03-05 DIAGNOSIS — N2581 Secondary hyperparathyroidism of renal origin: Secondary | ICD-10-CM | POA: Diagnosis not present

## 2021-03-07 DIAGNOSIS — Z992 Dependence on renal dialysis: Secondary | ICD-10-CM | POA: Diagnosis not present

## 2021-03-07 DIAGNOSIS — N2581 Secondary hyperparathyroidism of renal origin: Secondary | ICD-10-CM | POA: Diagnosis not present

## 2021-03-07 DIAGNOSIS — D689 Coagulation defect, unspecified: Secondary | ICD-10-CM | POA: Diagnosis not present

## 2021-03-07 DIAGNOSIS — D631 Anemia in chronic kidney disease: Secondary | ICD-10-CM | POA: Diagnosis not present

## 2021-03-07 DIAGNOSIS — N186 End stage renal disease: Secondary | ICD-10-CM | POA: Diagnosis not present

## 2021-03-09 DIAGNOSIS — D631 Anemia in chronic kidney disease: Secondary | ICD-10-CM | POA: Diagnosis not present

## 2021-03-09 DIAGNOSIS — N2581 Secondary hyperparathyroidism of renal origin: Secondary | ICD-10-CM | POA: Diagnosis not present

## 2021-03-09 DIAGNOSIS — D689 Coagulation defect, unspecified: Secondary | ICD-10-CM | POA: Diagnosis not present

## 2021-03-09 DIAGNOSIS — N186 End stage renal disease: Secondary | ICD-10-CM | POA: Diagnosis not present

## 2021-03-09 DIAGNOSIS — Z992 Dependence on renal dialysis: Secondary | ICD-10-CM | POA: Diagnosis not present

## 2021-03-12 DIAGNOSIS — N2581 Secondary hyperparathyroidism of renal origin: Secondary | ICD-10-CM | POA: Diagnosis not present

## 2021-03-12 DIAGNOSIS — D689 Coagulation defect, unspecified: Secondary | ICD-10-CM | POA: Diagnosis not present

## 2021-03-12 DIAGNOSIS — N186 End stage renal disease: Secondary | ICD-10-CM | POA: Diagnosis not present

## 2021-03-12 DIAGNOSIS — D631 Anemia in chronic kidney disease: Secondary | ICD-10-CM | POA: Diagnosis not present

## 2021-03-12 DIAGNOSIS — Z992 Dependence on renal dialysis: Secondary | ICD-10-CM | POA: Diagnosis not present

## 2021-03-14 DIAGNOSIS — D631 Anemia in chronic kidney disease: Secondary | ICD-10-CM | POA: Diagnosis not present

## 2021-03-14 DIAGNOSIS — N186 End stage renal disease: Secondary | ICD-10-CM | POA: Diagnosis not present

## 2021-03-14 DIAGNOSIS — D689 Coagulation defect, unspecified: Secondary | ICD-10-CM | POA: Diagnosis not present

## 2021-03-14 DIAGNOSIS — Z992 Dependence on renal dialysis: Secondary | ICD-10-CM | POA: Diagnosis not present

## 2021-03-14 DIAGNOSIS — N2581 Secondary hyperparathyroidism of renal origin: Secondary | ICD-10-CM | POA: Diagnosis not present

## 2021-03-16 DIAGNOSIS — N2581 Secondary hyperparathyroidism of renal origin: Secondary | ICD-10-CM | POA: Diagnosis not present

## 2021-03-16 DIAGNOSIS — Z992 Dependence on renal dialysis: Secondary | ICD-10-CM | POA: Diagnosis not present

## 2021-03-16 DIAGNOSIS — D689 Coagulation defect, unspecified: Secondary | ICD-10-CM | POA: Diagnosis not present

## 2021-03-16 DIAGNOSIS — D631 Anemia in chronic kidney disease: Secondary | ICD-10-CM | POA: Diagnosis not present

## 2021-03-16 DIAGNOSIS — N186 End stage renal disease: Secondary | ICD-10-CM | POA: Diagnosis not present

## 2021-03-17 DIAGNOSIS — Z992 Dependence on renal dialysis: Secondary | ICD-10-CM | POA: Diagnosis not present

## 2021-03-17 DIAGNOSIS — I129 Hypertensive chronic kidney disease with stage 1 through stage 4 chronic kidney disease, or unspecified chronic kidney disease: Secondary | ICD-10-CM | POA: Diagnosis not present

## 2021-03-17 DIAGNOSIS — N186 End stage renal disease: Secondary | ICD-10-CM | POA: Diagnosis not present

## 2021-03-19 DIAGNOSIS — D689 Coagulation defect, unspecified: Secondary | ICD-10-CM | POA: Diagnosis not present

## 2021-03-19 DIAGNOSIS — D631 Anemia in chronic kidney disease: Secondary | ICD-10-CM | POA: Diagnosis not present

## 2021-03-19 DIAGNOSIS — N186 End stage renal disease: Secondary | ICD-10-CM | POA: Diagnosis not present

## 2021-03-19 DIAGNOSIS — Z992 Dependence on renal dialysis: Secondary | ICD-10-CM | POA: Diagnosis not present

## 2021-03-19 DIAGNOSIS — N2581 Secondary hyperparathyroidism of renal origin: Secondary | ICD-10-CM | POA: Diagnosis not present

## 2021-03-21 DIAGNOSIS — N2581 Secondary hyperparathyroidism of renal origin: Secondary | ICD-10-CM | POA: Diagnosis not present

## 2021-03-21 DIAGNOSIS — D631 Anemia in chronic kidney disease: Secondary | ICD-10-CM | POA: Diagnosis not present

## 2021-03-21 DIAGNOSIS — Z992 Dependence on renal dialysis: Secondary | ICD-10-CM | POA: Diagnosis not present

## 2021-03-21 DIAGNOSIS — N186 End stage renal disease: Secondary | ICD-10-CM | POA: Diagnosis not present

## 2021-03-21 DIAGNOSIS — D689 Coagulation defect, unspecified: Secondary | ICD-10-CM | POA: Diagnosis not present

## 2021-03-23 DIAGNOSIS — Z992 Dependence on renal dialysis: Secondary | ICD-10-CM | POA: Diagnosis not present

## 2021-03-23 DIAGNOSIS — D631 Anemia in chronic kidney disease: Secondary | ICD-10-CM | POA: Diagnosis not present

## 2021-03-23 DIAGNOSIS — N186 End stage renal disease: Secondary | ICD-10-CM | POA: Diagnosis not present

## 2021-03-23 DIAGNOSIS — N2581 Secondary hyperparathyroidism of renal origin: Secondary | ICD-10-CM | POA: Diagnosis not present

## 2021-03-23 DIAGNOSIS — D689 Coagulation defect, unspecified: Secondary | ICD-10-CM | POA: Diagnosis not present

## 2021-03-26 DIAGNOSIS — N2581 Secondary hyperparathyroidism of renal origin: Secondary | ICD-10-CM | POA: Diagnosis not present

## 2021-03-26 DIAGNOSIS — N186 End stage renal disease: Secondary | ICD-10-CM | POA: Diagnosis not present

## 2021-03-26 DIAGNOSIS — D689 Coagulation defect, unspecified: Secondary | ICD-10-CM | POA: Diagnosis not present

## 2021-03-26 DIAGNOSIS — D631 Anemia in chronic kidney disease: Secondary | ICD-10-CM | POA: Diagnosis not present

## 2021-03-26 DIAGNOSIS — Z992 Dependence on renal dialysis: Secondary | ICD-10-CM | POA: Diagnosis not present

## 2021-03-28 DIAGNOSIS — D631 Anemia in chronic kidney disease: Secondary | ICD-10-CM | POA: Diagnosis not present

## 2021-03-28 DIAGNOSIS — N2581 Secondary hyperparathyroidism of renal origin: Secondary | ICD-10-CM | POA: Diagnosis not present

## 2021-03-28 DIAGNOSIS — N186 End stage renal disease: Secondary | ICD-10-CM | POA: Diagnosis not present

## 2021-03-28 DIAGNOSIS — Z992 Dependence on renal dialysis: Secondary | ICD-10-CM | POA: Diagnosis not present

## 2021-03-28 DIAGNOSIS — D689 Coagulation defect, unspecified: Secondary | ICD-10-CM | POA: Diagnosis not present

## 2021-03-30 DIAGNOSIS — D689 Coagulation defect, unspecified: Secondary | ICD-10-CM | POA: Diagnosis not present

## 2021-03-30 DIAGNOSIS — N186 End stage renal disease: Secondary | ICD-10-CM | POA: Diagnosis not present

## 2021-03-30 DIAGNOSIS — Z992 Dependence on renal dialysis: Secondary | ICD-10-CM | POA: Diagnosis not present

## 2021-03-30 DIAGNOSIS — D631 Anemia in chronic kidney disease: Secondary | ICD-10-CM | POA: Diagnosis not present

## 2021-03-30 DIAGNOSIS — N2581 Secondary hyperparathyroidism of renal origin: Secondary | ICD-10-CM | POA: Diagnosis not present

## 2021-04-02 DIAGNOSIS — Z992 Dependence on renal dialysis: Secondary | ICD-10-CM | POA: Diagnosis not present

## 2021-04-02 DIAGNOSIS — D689 Coagulation defect, unspecified: Secondary | ICD-10-CM | POA: Diagnosis not present

## 2021-04-02 DIAGNOSIS — N186 End stage renal disease: Secondary | ICD-10-CM | POA: Diagnosis not present

## 2021-04-02 DIAGNOSIS — N2581 Secondary hyperparathyroidism of renal origin: Secondary | ICD-10-CM | POA: Diagnosis not present

## 2021-04-02 DIAGNOSIS — D631 Anemia in chronic kidney disease: Secondary | ICD-10-CM | POA: Diagnosis not present

## 2021-04-04 DIAGNOSIS — N2581 Secondary hyperparathyroidism of renal origin: Secondary | ICD-10-CM | POA: Diagnosis not present

## 2021-04-04 DIAGNOSIS — Z992 Dependence on renal dialysis: Secondary | ICD-10-CM | POA: Diagnosis not present

## 2021-04-04 DIAGNOSIS — D631 Anemia in chronic kidney disease: Secondary | ICD-10-CM | POA: Diagnosis not present

## 2021-04-04 DIAGNOSIS — N186 End stage renal disease: Secondary | ICD-10-CM | POA: Diagnosis not present

## 2021-04-04 DIAGNOSIS — D689 Coagulation defect, unspecified: Secondary | ICD-10-CM | POA: Diagnosis not present

## 2021-04-06 DIAGNOSIS — Z992 Dependence on renal dialysis: Secondary | ICD-10-CM | POA: Diagnosis not present

## 2021-04-06 DIAGNOSIS — N2581 Secondary hyperparathyroidism of renal origin: Secondary | ICD-10-CM | POA: Diagnosis not present

## 2021-04-06 DIAGNOSIS — D689 Coagulation defect, unspecified: Secondary | ICD-10-CM | POA: Diagnosis not present

## 2021-04-06 DIAGNOSIS — D631 Anemia in chronic kidney disease: Secondary | ICD-10-CM | POA: Diagnosis not present

## 2021-04-06 DIAGNOSIS — N186 End stage renal disease: Secondary | ICD-10-CM | POA: Diagnosis not present

## 2021-04-09 DIAGNOSIS — N186 End stage renal disease: Secondary | ICD-10-CM | POA: Diagnosis not present

## 2021-04-09 DIAGNOSIS — Z992 Dependence on renal dialysis: Secondary | ICD-10-CM | POA: Diagnosis not present

## 2021-04-09 DIAGNOSIS — D689 Coagulation defect, unspecified: Secondary | ICD-10-CM | POA: Diagnosis not present

## 2021-04-09 DIAGNOSIS — N2581 Secondary hyperparathyroidism of renal origin: Secondary | ICD-10-CM | POA: Diagnosis not present

## 2021-04-09 DIAGNOSIS — D631 Anemia in chronic kidney disease: Secondary | ICD-10-CM | POA: Diagnosis not present

## 2021-04-11 DIAGNOSIS — N2581 Secondary hyperparathyroidism of renal origin: Secondary | ICD-10-CM | POA: Diagnosis not present

## 2021-04-11 DIAGNOSIS — Z992 Dependence on renal dialysis: Secondary | ICD-10-CM | POA: Diagnosis not present

## 2021-04-11 DIAGNOSIS — D631 Anemia in chronic kidney disease: Secondary | ICD-10-CM | POA: Diagnosis not present

## 2021-04-11 DIAGNOSIS — D689 Coagulation defect, unspecified: Secondary | ICD-10-CM | POA: Diagnosis not present

## 2021-04-11 DIAGNOSIS — N186 End stage renal disease: Secondary | ICD-10-CM | POA: Diagnosis not present

## 2021-04-13 DIAGNOSIS — N2581 Secondary hyperparathyroidism of renal origin: Secondary | ICD-10-CM | POA: Diagnosis not present

## 2021-04-13 DIAGNOSIS — D689 Coagulation defect, unspecified: Secondary | ICD-10-CM | POA: Diagnosis not present

## 2021-04-13 DIAGNOSIS — D631 Anemia in chronic kidney disease: Secondary | ICD-10-CM | POA: Diagnosis not present

## 2021-04-13 DIAGNOSIS — N186 End stage renal disease: Secondary | ICD-10-CM | POA: Diagnosis not present

## 2021-04-13 DIAGNOSIS — Z992 Dependence on renal dialysis: Secondary | ICD-10-CM | POA: Diagnosis not present

## 2021-04-16 DIAGNOSIS — D689 Coagulation defect, unspecified: Secondary | ICD-10-CM | POA: Diagnosis not present

## 2021-04-16 DIAGNOSIS — N2581 Secondary hyperparathyroidism of renal origin: Secondary | ICD-10-CM | POA: Diagnosis not present

## 2021-04-16 DIAGNOSIS — N186 End stage renal disease: Secondary | ICD-10-CM | POA: Diagnosis not present

## 2021-04-16 DIAGNOSIS — D631 Anemia in chronic kidney disease: Secondary | ICD-10-CM | POA: Diagnosis not present

## 2021-04-16 DIAGNOSIS — Z992 Dependence on renal dialysis: Secondary | ICD-10-CM | POA: Diagnosis not present

## 2021-04-17 DIAGNOSIS — N186 End stage renal disease: Secondary | ICD-10-CM | POA: Diagnosis not present

## 2021-04-17 DIAGNOSIS — I129 Hypertensive chronic kidney disease with stage 1 through stage 4 chronic kidney disease, or unspecified chronic kidney disease: Secondary | ICD-10-CM | POA: Diagnosis not present

## 2021-04-17 DIAGNOSIS — Z992 Dependence on renal dialysis: Secondary | ICD-10-CM | POA: Diagnosis not present

## 2021-04-18 DIAGNOSIS — N2581 Secondary hyperparathyroidism of renal origin: Secondary | ICD-10-CM | POA: Diagnosis not present

## 2021-04-18 DIAGNOSIS — D631 Anemia in chronic kidney disease: Secondary | ICD-10-CM | POA: Diagnosis not present

## 2021-04-18 DIAGNOSIS — D689 Coagulation defect, unspecified: Secondary | ICD-10-CM | POA: Diagnosis not present

## 2021-04-18 DIAGNOSIS — Z992 Dependence on renal dialysis: Secondary | ICD-10-CM | POA: Diagnosis not present

## 2021-04-18 DIAGNOSIS — N186 End stage renal disease: Secondary | ICD-10-CM | POA: Diagnosis not present

## 2021-04-20 DIAGNOSIS — N186 End stage renal disease: Secondary | ICD-10-CM | POA: Diagnosis not present

## 2021-04-20 DIAGNOSIS — D689 Coagulation defect, unspecified: Secondary | ICD-10-CM | POA: Diagnosis not present

## 2021-04-20 DIAGNOSIS — N2581 Secondary hyperparathyroidism of renal origin: Secondary | ICD-10-CM | POA: Diagnosis not present

## 2021-04-20 DIAGNOSIS — D631 Anemia in chronic kidney disease: Secondary | ICD-10-CM | POA: Diagnosis not present

## 2021-04-20 DIAGNOSIS — Z992 Dependence on renal dialysis: Secondary | ICD-10-CM | POA: Diagnosis not present

## 2021-04-23 DIAGNOSIS — N2581 Secondary hyperparathyroidism of renal origin: Secondary | ICD-10-CM | POA: Diagnosis not present

## 2021-04-23 DIAGNOSIS — N186 End stage renal disease: Secondary | ICD-10-CM | POA: Diagnosis not present

## 2021-04-23 DIAGNOSIS — D689 Coagulation defect, unspecified: Secondary | ICD-10-CM | POA: Diagnosis not present

## 2021-04-23 DIAGNOSIS — Z992 Dependence on renal dialysis: Secondary | ICD-10-CM | POA: Diagnosis not present

## 2021-04-23 DIAGNOSIS — D631 Anemia in chronic kidney disease: Secondary | ICD-10-CM | POA: Diagnosis not present

## 2021-04-25 DIAGNOSIS — Z992 Dependence on renal dialysis: Secondary | ICD-10-CM | POA: Diagnosis not present

## 2021-04-25 DIAGNOSIS — D689 Coagulation defect, unspecified: Secondary | ICD-10-CM | POA: Diagnosis not present

## 2021-04-25 DIAGNOSIS — N186 End stage renal disease: Secondary | ICD-10-CM | POA: Diagnosis not present

## 2021-04-25 DIAGNOSIS — N2581 Secondary hyperparathyroidism of renal origin: Secondary | ICD-10-CM | POA: Diagnosis not present

## 2021-04-25 DIAGNOSIS — D631 Anemia in chronic kidney disease: Secondary | ICD-10-CM | POA: Diagnosis not present

## 2021-04-27 DIAGNOSIS — N186 End stage renal disease: Secondary | ICD-10-CM | POA: Diagnosis not present

## 2021-04-27 DIAGNOSIS — Z992 Dependence on renal dialysis: Secondary | ICD-10-CM | POA: Diagnosis not present

## 2021-04-27 DIAGNOSIS — N2581 Secondary hyperparathyroidism of renal origin: Secondary | ICD-10-CM | POA: Diagnosis not present

## 2021-04-27 DIAGNOSIS — D631 Anemia in chronic kidney disease: Secondary | ICD-10-CM | POA: Diagnosis not present

## 2021-04-27 DIAGNOSIS — D689 Coagulation defect, unspecified: Secondary | ICD-10-CM | POA: Diagnosis not present

## 2021-04-30 DIAGNOSIS — N186 End stage renal disease: Secondary | ICD-10-CM | POA: Diagnosis not present

## 2021-04-30 DIAGNOSIS — Z992 Dependence on renal dialysis: Secondary | ICD-10-CM | POA: Diagnosis not present

## 2021-04-30 DIAGNOSIS — D631 Anemia in chronic kidney disease: Secondary | ICD-10-CM | POA: Diagnosis not present

## 2021-04-30 DIAGNOSIS — D689 Coagulation defect, unspecified: Secondary | ICD-10-CM | POA: Diagnosis not present

## 2021-04-30 DIAGNOSIS — N2581 Secondary hyperparathyroidism of renal origin: Secondary | ICD-10-CM | POA: Diagnosis not present

## 2021-05-02 DIAGNOSIS — N186 End stage renal disease: Secondary | ICD-10-CM | POA: Diagnosis not present

## 2021-05-02 DIAGNOSIS — D689 Coagulation defect, unspecified: Secondary | ICD-10-CM | POA: Diagnosis not present

## 2021-05-02 DIAGNOSIS — N2581 Secondary hyperparathyroidism of renal origin: Secondary | ICD-10-CM | POA: Diagnosis not present

## 2021-05-02 DIAGNOSIS — Z992 Dependence on renal dialysis: Secondary | ICD-10-CM | POA: Diagnosis not present

## 2021-05-02 DIAGNOSIS — D631 Anemia in chronic kidney disease: Secondary | ICD-10-CM | POA: Diagnosis not present

## 2021-05-04 DIAGNOSIS — N2581 Secondary hyperparathyroidism of renal origin: Secondary | ICD-10-CM | POA: Diagnosis not present

## 2021-05-04 DIAGNOSIS — D631 Anemia in chronic kidney disease: Secondary | ICD-10-CM | POA: Diagnosis not present

## 2021-05-04 DIAGNOSIS — N186 End stage renal disease: Secondary | ICD-10-CM | POA: Diagnosis not present

## 2021-05-04 DIAGNOSIS — D689 Coagulation defect, unspecified: Secondary | ICD-10-CM | POA: Diagnosis not present

## 2021-05-04 DIAGNOSIS — Z992 Dependence on renal dialysis: Secondary | ICD-10-CM | POA: Diagnosis not present

## 2021-05-07 DIAGNOSIS — Z992 Dependence on renal dialysis: Secondary | ICD-10-CM | POA: Diagnosis not present

## 2021-05-07 DIAGNOSIS — D689 Coagulation defect, unspecified: Secondary | ICD-10-CM | POA: Diagnosis not present

## 2021-05-07 DIAGNOSIS — N2581 Secondary hyperparathyroidism of renal origin: Secondary | ICD-10-CM | POA: Diagnosis not present

## 2021-05-07 DIAGNOSIS — N186 End stage renal disease: Secondary | ICD-10-CM | POA: Diagnosis not present

## 2021-05-07 DIAGNOSIS — D631 Anemia in chronic kidney disease: Secondary | ICD-10-CM | POA: Diagnosis not present

## 2021-05-09 DIAGNOSIS — D631 Anemia in chronic kidney disease: Secondary | ICD-10-CM | POA: Diagnosis not present

## 2021-05-09 DIAGNOSIS — N186 End stage renal disease: Secondary | ICD-10-CM | POA: Diagnosis not present

## 2021-05-09 DIAGNOSIS — Z992 Dependence on renal dialysis: Secondary | ICD-10-CM | POA: Diagnosis not present

## 2021-05-09 DIAGNOSIS — N2581 Secondary hyperparathyroidism of renal origin: Secondary | ICD-10-CM | POA: Diagnosis not present

## 2021-05-09 DIAGNOSIS — D689 Coagulation defect, unspecified: Secondary | ICD-10-CM | POA: Diagnosis not present

## 2021-05-11 DIAGNOSIS — Z992 Dependence on renal dialysis: Secondary | ICD-10-CM | POA: Diagnosis not present

## 2021-05-11 DIAGNOSIS — N186 End stage renal disease: Secondary | ICD-10-CM | POA: Diagnosis not present

## 2021-05-11 DIAGNOSIS — N2581 Secondary hyperparathyroidism of renal origin: Secondary | ICD-10-CM | POA: Diagnosis not present

## 2021-05-11 DIAGNOSIS — D631 Anemia in chronic kidney disease: Secondary | ICD-10-CM | POA: Diagnosis not present

## 2021-05-11 DIAGNOSIS — D689 Coagulation defect, unspecified: Secondary | ICD-10-CM | POA: Diagnosis not present

## 2021-05-14 DIAGNOSIS — D689 Coagulation defect, unspecified: Secondary | ICD-10-CM | POA: Diagnosis not present

## 2021-05-14 DIAGNOSIS — N2581 Secondary hyperparathyroidism of renal origin: Secondary | ICD-10-CM | POA: Diagnosis not present

## 2021-05-14 DIAGNOSIS — N186 End stage renal disease: Secondary | ICD-10-CM | POA: Diagnosis not present

## 2021-05-14 DIAGNOSIS — Z992 Dependence on renal dialysis: Secondary | ICD-10-CM | POA: Diagnosis not present

## 2021-05-14 DIAGNOSIS — D631 Anemia in chronic kidney disease: Secondary | ICD-10-CM | POA: Diagnosis not present

## 2021-05-15 DIAGNOSIS — I129 Hypertensive chronic kidney disease with stage 1 through stage 4 chronic kidney disease, or unspecified chronic kidney disease: Secondary | ICD-10-CM | POA: Diagnosis not present

## 2021-05-15 DIAGNOSIS — N186 End stage renal disease: Secondary | ICD-10-CM | POA: Diagnosis not present

## 2021-05-15 DIAGNOSIS — Z992 Dependence on renal dialysis: Secondary | ICD-10-CM | POA: Diagnosis not present

## 2021-05-16 DIAGNOSIS — Z992 Dependence on renal dialysis: Secondary | ICD-10-CM | POA: Diagnosis not present

## 2021-05-16 DIAGNOSIS — N186 End stage renal disease: Secondary | ICD-10-CM | POA: Diagnosis not present

## 2021-05-16 DIAGNOSIS — D631 Anemia in chronic kidney disease: Secondary | ICD-10-CM | POA: Diagnosis not present

## 2021-05-16 DIAGNOSIS — D689 Coagulation defect, unspecified: Secondary | ICD-10-CM | POA: Diagnosis not present

## 2021-05-16 DIAGNOSIS — N2581 Secondary hyperparathyroidism of renal origin: Secondary | ICD-10-CM | POA: Diagnosis not present

## 2021-05-18 DIAGNOSIS — D689 Coagulation defect, unspecified: Secondary | ICD-10-CM | POA: Diagnosis not present

## 2021-05-18 DIAGNOSIS — N2581 Secondary hyperparathyroidism of renal origin: Secondary | ICD-10-CM | POA: Diagnosis not present

## 2021-05-18 DIAGNOSIS — N186 End stage renal disease: Secondary | ICD-10-CM | POA: Diagnosis not present

## 2021-05-18 DIAGNOSIS — D631 Anemia in chronic kidney disease: Secondary | ICD-10-CM | POA: Diagnosis not present

## 2021-05-18 DIAGNOSIS — Z992 Dependence on renal dialysis: Secondary | ICD-10-CM | POA: Diagnosis not present

## 2021-05-21 DIAGNOSIS — N186 End stage renal disease: Secondary | ICD-10-CM | POA: Diagnosis not present

## 2021-05-21 DIAGNOSIS — D689 Coagulation defect, unspecified: Secondary | ICD-10-CM | POA: Diagnosis not present

## 2021-05-21 DIAGNOSIS — D631 Anemia in chronic kidney disease: Secondary | ICD-10-CM | POA: Diagnosis not present

## 2021-05-21 DIAGNOSIS — N2581 Secondary hyperparathyroidism of renal origin: Secondary | ICD-10-CM | POA: Diagnosis not present

## 2021-05-21 DIAGNOSIS — Z992 Dependence on renal dialysis: Secondary | ICD-10-CM | POA: Diagnosis not present

## 2021-05-23 DIAGNOSIS — D689 Coagulation defect, unspecified: Secondary | ICD-10-CM | POA: Diagnosis not present

## 2021-05-23 DIAGNOSIS — N2581 Secondary hyperparathyroidism of renal origin: Secondary | ICD-10-CM | POA: Diagnosis not present

## 2021-05-23 DIAGNOSIS — N186 End stage renal disease: Secondary | ICD-10-CM | POA: Diagnosis not present

## 2021-05-23 DIAGNOSIS — Z992 Dependence on renal dialysis: Secondary | ICD-10-CM | POA: Diagnosis not present

## 2021-05-23 DIAGNOSIS — D631 Anemia in chronic kidney disease: Secondary | ICD-10-CM | POA: Diagnosis not present

## 2021-05-25 DIAGNOSIS — D689 Coagulation defect, unspecified: Secondary | ICD-10-CM | POA: Diagnosis not present

## 2021-05-25 DIAGNOSIS — D631 Anemia in chronic kidney disease: Secondary | ICD-10-CM | POA: Diagnosis not present

## 2021-05-25 DIAGNOSIS — Z992 Dependence on renal dialysis: Secondary | ICD-10-CM | POA: Diagnosis not present

## 2021-05-25 DIAGNOSIS — N186 End stage renal disease: Secondary | ICD-10-CM | POA: Diagnosis not present

## 2021-05-25 DIAGNOSIS — N2581 Secondary hyperparathyroidism of renal origin: Secondary | ICD-10-CM | POA: Diagnosis not present

## 2021-05-28 DIAGNOSIS — D631 Anemia in chronic kidney disease: Secondary | ICD-10-CM | POA: Diagnosis not present

## 2021-05-28 DIAGNOSIS — N2581 Secondary hyperparathyroidism of renal origin: Secondary | ICD-10-CM | POA: Diagnosis not present

## 2021-05-28 DIAGNOSIS — D689 Coagulation defect, unspecified: Secondary | ICD-10-CM | POA: Diagnosis not present

## 2021-05-28 DIAGNOSIS — N186 End stage renal disease: Secondary | ICD-10-CM | POA: Diagnosis not present

## 2021-05-28 DIAGNOSIS — Z992 Dependence on renal dialysis: Secondary | ICD-10-CM | POA: Diagnosis not present

## 2021-05-30 DIAGNOSIS — N186 End stage renal disease: Secondary | ICD-10-CM | POA: Diagnosis not present

## 2021-05-30 DIAGNOSIS — Z992 Dependence on renal dialysis: Secondary | ICD-10-CM | POA: Diagnosis not present

## 2021-05-30 DIAGNOSIS — N2581 Secondary hyperparathyroidism of renal origin: Secondary | ICD-10-CM | POA: Diagnosis not present

## 2021-05-30 DIAGNOSIS — D689 Coagulation defect, unspecified: Secondary | ICD-10-CM | POA: Diagnosis not present

## 2021-05-30 DIAGNOSIS — D631 Anemia in chronic kidney disease: Secondary | ICD-10-CM | POA: Diagnosis not present

## 2021-06-01 DIAGNOSIS — N2581 Secondary hyperparathyroidism of renal origin: Secondary | ICD-10-CM | POA: Diagnosis not present

## 2021-06-01 DIAGNOSIS — D689 Coagulation defect, unspecified: Secondary | ICD-10-CM | POA: Diagnosis not present

## 2021-06-01 DIAGNOSIS — Z992 Dependence on renal dialysis: Secondary | ICD-10-CM | POA: Diagnosis not present

## 2021-06-01 DIAGNOSIS — D631 Anemia in chronic kidney disease: Secondary | ICD-10-CM | POA: Diagnosis not present

## 2021-06-01 DIAGNOSIS — N186 End stage renal disease: Secondary | ICD-10-CM | POA: Diagnosis not present

## 2021-06-04 DIAGNOSIS — Z992 Dependence on renal dialysis: Secondary | ICD-10-CM | POA: Diagnosis not present

## 2021-06-04 DIAGNOSIS — D631 Anemia in chronic kidney disease: Secondary | ICD-10-CM | POA: Diagnosis not present

## 2021-06-04 DIAGNOSIS — N186 End stage renal disease: Secondary | ICD-10-CM | POA: Diagnosis not present

## 2021-06-04 DIAGNOSIS — D689 Coagulation defect, unspecified: Secondary | ICD-10-CM | POA: Diagnosis not present

## 2021-06-04 DIAGNOSIS — N2581 Secondary hyperparathyroidism of renal origin: Secondary | ICD-10-CM | POA: Diagnosis not present

## 2021-06-06 DIAGNOSIS — D631 Anemia in chronic kidney disease: Secondary | ICD-10-CM | POA: Diagnosis not present

## 2021-06-06 DIAGNOSIS — D689 Coagulation defect, unspecified: Secondary | ICD-10-CM | POA: Diagnosis not present

## 2021-06-06 DIAGNOSIS — N2581 Secondary hyperparathyroidism of renal origin: Secondary | ICD-10-CM | POA: Diagnosis not present

## 2021-06-06 DIAGNOSIS — Z992 Dependence on renal dialysis: Secondary | ICD-10-CM | POA: Diagnosis not present

## 2021-06-06 DIAGNOSIS — N186 End stage renal disease: Secondary | ICD-10-CM | POA: Diagnosis not present

## 2021-06-08 DIAGNOSIS — N2581 Secondary hyperparathyroidism of renal origin: Secondary | ICD-10-CM | POA: Diagnosis not present

## 2021-06-08 DIAGNOSIS — N186 End stage renal disease: Secondary | ICD-10-CM | POA: Diagnosis not present

## 2021-06-08 DIAGNOSIS — D689 Coagulation defect, unspecified: Secondary | ICD-10-CM | POA: Diagnosis not present

## 2021-06-08 DIAGNOSIS — Z992 Dependence on renal dialysis: Secondary | ICD-10-CM | POA: Diagnosis not present

## 2021-06-08 DIAGNOSIS — D631 Anemia in chronic kidney disease: Secondary | ICD-10-CM | POA: Diagnosis not present

## 2021-06-11 DIAGNOSIS — Z992 Dependence on renal dialysis: Secondary | ICD-10-CM | POA: Diagnosis not present

## 2021-06-11 DIAGNOSIS — D631 Anemia in chronic kidney disease: Secondary | ICD-10-CM | POA: Diagnosis not present

## 2021-06-11 DIAGNOSIS — N186 End stage renal disease: Secondary | ICD-10-CM | POA: Diagnosis not present

## 2021-06-11 DIAGNOSIS — N2581 Secondary hyperparathyroidism of renal origin: Secondary | ICD-10-CM | POA: Diagnosis not present

## 2021-06-11 DIAGNOSIS — D689 Coagulation defect, unspecified: Secondary | ICD-10-CM | POA: Diagnosis not present

## 2021-06-13 DIAGNOSIS — D689 Coagulation defect, unspecified: Secondary | ICD-10-CM | POA: Diagnosis not present

## 2021-06-13 DIAGNOSIS — D631 Anemia in chronic kidney disease: Secondary | ICD-10-CM | POA: Diagnosis not present

## 2021-06-13 DIAGNOSIS — Z992 Dependence on renal dialysis: Secondary | ICD-10-CM | POA: Diagnosis not present

## 2021-06-13 DIAGNOSIS — N2581 Secondary hyperparathyroidism of renal origin: Secondary | ICD-10-CM | POA: Diagnosis not present

## 2021-06-13 DIAGNOSIS — N186 End stage renal disease: Secondary | ICD-10-CM | POA: Diagnosis not present

## 2021-06-15 DIAGNOSIS — I129 Hypertensive chronic kidney disease with stage 1 through stage 4 chronic kidney disease, or unspecified chronic kidney disease: Secondary | ICD-10-CM | POA: Diagnosis not present

## 2021-06-15 DIAGNOSIS — N2581 Secondary hyperparathyroidism of renal origin: Secondary | ICD-10-CM | POA: Diagnosis not present

## 2021-06-15 DIAGNOSIS — N186 End stage renal disease: Secondary | ICD-10-CM | POA: Diagnosis not present

## 2021-06-15 DIAGNOSIS — Z992 Dependence on renal dialysis: Secondary | ICD-10-CM | POA: Diagnosis not present

## 2021-06-15 DIAGNOSIS — D689 Coagulation defect, unspecified: Secondary | ICD-10-CM | POA: Diagnosis not present

## 2021-06-18 DIAGNOSIS — Z992 Dependence on renal dialysis: Secondary | ICD-10-CM | POA: Diagnosis not present

## 2021-06-18 DIAGNOSIS — D631 Anemia in chronic kidney disease: Secondary | ICD-10-CM | POA: Diagnosis not present

## 2021-06-18 DIAGNOSIS — N186 End stage renal disease: Secondary | ICD-10-CM | POA: Diagnosis not present

## 2021-06-18 DIAGNOSIS — N2581 Secondary hyperparathyroidism of renal origin: Secondary | ICD-10-CM | POA: Diagnosis not present

## 2021-06-18 DIAGNOSIS — D689 Coagulation defect, unspecified: Secondary | ICD-10-CM | POA: Diagnosis not present

## 2021-06-20 DIAGNOSIS — N2581 Secondary hyperparathyroidism of renal origin: Secondary | ICD-10-CM | POA: Diagnosis not present

## 2021-06-20 DIAGNOSIS — Z992 Dependence on renal dialysis: Secondary | ICD-10-CM | POA: Diagnosis not present

## 2021-06-20 DIAGNOSIS — D631 Anemia in chronic kidney disease: Secondary | ICD-10-CM | POA: Diagnosis not present

## 2021-06-20 DIAGNOSIS — D689 Coagulation defect, unspecified: Secondary | ICD-10-CM | POA: Diagnosis not present

## 2021-06-20 DIAGNOSIS — N186 End stage renal disease: Secondary | ICD-10-CM | POA: Diagnosis not present

## 2021-06-22 DIAGNOSIS — N2581 Secondary hyperparathyroidism of renal origin: Secondary | ICD-10-CM | POA: Diagnosis not present

## 2021-06-22 DIAGNOSIS — Z992 Dependence on renal dialysis: Secondary | ICD-10-CM | POA: Diagnosis not present

## 2021-06-22 DIAGNOSIS — N186 End stage renal disease: Secondary | ICD-10-CM | POA: Diagnosis not present

## 2021-06-22 DIAGNOSIS — D631 Anemia in chronic kidney disease: Secondary | ICD-10-CM | POA: Diagnosis not present

## 2021-06-22 DIAGNOSIS — D689 Coagulation defect, unspecified: Secondary | ICD-10-CM | POA: Diagnosis not present

## 2021-06-25 DIAGNOSIS — Z992 Dependence on renal dialysis: Secondary | ICD-10-CM | POA: Diagnosis not present

## 2021-06-25 DIAGNOSIS — N186 End stage renal disease: Secondary | ICD-10-CM | POA: Diagnosis not present

## 2021-06-25 DIAGNOSIS — D631 Anemia in chronic kidney disease: Secondary | ICD-10-CM | POA: Diagnosis not present

## 2021-06-25 DIAGNOSIS — D689 Coagulation defect, unspecified: Secondary | ICD-10-CM | POA: Diagnosis not present

## 2021-06-25 DIAGNOSIS — N2581 Secondary hyperparathyroidism of renal origin: Secondary | ICD-10-CM | POA: Diagnosis not present

## 2021-06-27 DIAGNOSIS — Z992 Dependence on renal dialysis: Secondary | ICD-10-CM | POA: Diagnosis not present

## 2021-06-27 DIAGNOSIS — D689 Coagulation defect, unspecified: Secondary | ICD-10-CM | POA: Diagnosis not present

## 2021-06-27 DIAGNOSIS — N186 End stage renal disease: Secondary | ICD-10-CM | POA: Diagnosis not present

## 2021-06-27 DIAGNOSIS — N2581 Secondary hyperparathyroidism of renal origin: Secondary | ICD-10-CM | POA: Diagnosis not present

## 2021-06-27 DIAGNOSIS — D631 Anemia in chronic kidney disease: Secondary | ICD-10-CM | POA: Diagnosis not present

## 2021-06-29 DIAGNOSIS — N2581 Secondary hyperparathyroidism of renal origin: Secondary | ICD-10-CM | POA: Diagnosis not present

## 2021-06-29 DIAGNOSIS — Z992 Dependence on renal dialysis: Secondary | ICD-10-CM | POA: Diagnosis not present

## 2021-06-29 DIAGNOSIS — D631 Anemia in chronic kidney disease: Secondary | ICD-10-CM | POA: Diagnosis not present

## 2021-06-29 DIAGNOSIS — N186 End stage renal disease: Secondary | ICD-10-CM | POA: Diagnosis not present

## 2021-06-29 DIAGNOSIS — D689 Coagulation defect, unspecified: Secondary | ICD-10-CM | POA: Diagnosis not present

## 2021-07-02 DIAGNOSIS — N2581 Secondary hyperparathyroidism of renal origin: Secondary | ICD-10-CM | POA: Diagnosis not present

## 2021-07-02 DIAGNOSIS — Z992 Dependence on renal dialysis: Secondary | ICD-10-CM | POA: Diagnosis not present

## 2021-07-02 DIAGNOSIS — N186 End stage renal disease: Secondary | ICD-10-CM | POA: Diagnosis not present

## 2021-07-02 DIAGNOSIS — D631 Anemia in chronic kidney disease: Secondary | ICD-10-CM | POA: Diagnosis not present

## 2021-07-02 DIAGNOSIS — D689 Coagulation defect, unspecified: Secondary | ICD-10-CM | POA: Diagnosis not present

## 2021-07-04 DIAGNOSIS — N186 End stage renal disease: Secondary | ICD-10-CM | POA: Diagnosis not present

## 2021-07-04 DIAGNOSIS — N2581 Secondary hyperparathyroidism of renal origin: Secondary | ICD-10-CM | POA: Diagnosis not present

## 2021-07-04 DIAGNOSIS — D631 Anemia in chronic kidney disease: Secondary | ICD-10-CM | POA: Diagnosis not present

## 2021-07-04 DIAGNOSIS — D689 Coagulation defect, unspecified: Secondary | ICD-10-CM | POA: Diagnosis not present

## 2021-07-04 DIAGNOSIS — Z992 Dependence on renal dialysis: Secondary | ICD-10-CM | POA: Diagnosis not present

## 2021-07-06 DIAGNOSIS — N186 End stage renal disease: Secondary | ICD-10-CM | POA: Diagnosis not present

## 2021-07-06 DIAGNOSIS — D689 Coagulation defect, unspecified: Secondary | ICD-10-CM | POA: Diagnosis not present

## 2021-07-06 DIAGNOSIS — D631 Anemia in chronic kidney disease: Secondary | ICD-10-CM | POA: Diagnosis not present

## 2021-07-06 DIAGNOSIS — Z992 Dependence on renal dialysis: Secondary | ICD-10-CM | POA: Diagnosis not present

## 2021-07-06 DIAGNOSIS — N2581 Secondary hyperparathyroidism of renal origin: Secondary | ICD-10-CM | POA: Diagnosis not present

## 2021-07-09 DIAGNOSIS — N2581 Secondary hyperparathyroidism of renal origin: Secondary | ICD-10-CM | POA: Diagnosis not present

## 2021-07-09 DIAGNOSIS — D689 Coagulation defect, unspecified: Secondary | ICD-10-CM | POA: Diagnosis not present

## 2021-07-09 DIAGNOSIS — Z992 Dependence on renal dialysis: Secondary | ICD-10-CM | POA: Diagnosis not present

## 2021-07-09 DIAGNOSIS — N186 End stage renal disease: Secondary | ICD-10-CM | POA: Diagnosis not present

## 2021-07-09 DIAGNOSIS — D631 Anemia in chronic kidney disease: Secondary | ICD-10-CM | POA: Diagnosis not present

## 2021-07-11 DIAGNOSIS — N186 End stage renal disease: Secondary | ICD-10-CM | POA: Diagnosis not present

## 2021-07-11 DIAGNOSIS — D689 Coagulation defect, unspecified: Secondary | ICD-10-CM | POA: Diagnosis not present

## 2021-07-11 DIAGNOSIS — N2581 Secondary hyperparathyroidism of renal origin: Secondary | ICD-10-CM | POA: Diagnosis not present

## 2021-07-11 DIAGNOSIS — Z992 Dependence on renal dialysis: Secondary | ICD-10-CM | POA: Diagnosis not present

## 2021-07-11 DIAGNOSIS — D631 Anemia in chronic kidney disease: Secondary | ICD-10-CM | POA: Diagnosis not present

## 2021-07-13 DIAGNOSIS — N186 End stage renal disease: Secondary | ICD-10-CM | POA: Diagnosis not present

## 2021-07-13 DIAGNOSIS — D631 Anemia in chronic kidney disease: Secondary | ICD-10-CM | POA: Diagnosis not present

## 2021-07-13 DIAGNOSIS — Z992 Dependence on renal dialysis: Secondary | ICD-10-CM | POA: Diagnosis not present

## 2021-07-13 DIAGNOSIS — D689 Coagulation defect, unspecified: Secondary | ICD-10-CM | POA: Diagnosis not present

## 2021-07-13 DIAGNOSIS — N2581 Secondary hyperparathyroidism of renal origin: Secondary | ICD-10-CM | POA: Diagnosis not present

## 2021-07-15 DIAGNOSIS — Z992 Dependence on renal dialysis: Secondary | ICD-10-CM | POA: Diagnosis not present

## 2021-07-15 DIAGNOSIS — N186 End stage renal disease: Secondary | ICD-10-CM | POA: Diagnosis not present

## 2021-07-15 DIAGNOSIS — I129 Hypertensive chronic kidney disease with stage 1 through stage 4 chronic kidney disease, or unspecified chronic kidney disease: Secondary | ICD-10-CM | POA: Diagnosis not present

## 2021-07-16 DIAGNOSIS — N2581 Secondary hyperparathyroidism of renal origin: Secondary | ICD-10-CM | POA: Diagnosis not present

## 2021-07-16 DIAGNOSIS — D689 Coagulation defect, unspecified: Secondary | ICD-10-CM | POA: Diagnosis not present

## 2021-07-16 DIAGNOSIS — Z992 Dependence on renal dialysis: Secondary | ICD-10-CM | POA: Diagnosis not present

## 2021-07-16 DIAGNOSIS — D631 Anemia in chronic kidney disease: Secondary | ICD-10-CM | POA: Diagnosis not present

## 2021-07-16 DIAGNOSIS — N186 End stage renal disease: Secondary | ICD-10-CM | POA: Diagnosis not present

## 2021-07-18 DIAGNOSIS — N186 End stage renal disease: Secondary | ICD-10-CM | POA: Diagnosis not present

## 2021-07-18 DIAGNOSIS — Z992 Dependence on renal dialysis: Secondary | ICD-10-CM | POA: Diagnosis not present

## 2021-07-18 DIAGNOSIS — N2581 Secondary hyperparathyroidism of renal origin: Secondary | ICD-10-CM | POA: Diagnosis not present

## 2021-07-18 DIAGNOSIS — D689 Coagulation defect, unspecified: Secondary | ICD-10-CM | POA: Diagnosis not present

## 2021-07-18 DIAGNOSIS — D631 Anemia in chronic kidney disease: Secondary | ICD-10-CM | POA: Diagnosis not present

## 2021-07-20 DIAGNOSIS — D689 Coagulation defect, unspecified: Secondary | ICD-10-CM | POA: Diagnosis not present

## 2021-07-20 DIAGNOSIS — Z992 Dependence on renal dialysis: Secondary | ICD-10-CM | POA: Diagnosis not present

## 2021-07-20 DIAGNOSIS — N186 End stage renal disease: Secondary | ICD-10-CM | POA: Diagnosis not present

## 2021-07-20 DIAGNOSIS — N2581 Secondary hyperparathyroidism of renal origin: Secondary | ICD-10-CM | POA: Diagnosis not present

## 2021-07-20 DIAGNOSIS — D631 Anemia in chronic kidney disease: Secondary | ICD-10-CM | POA: Diagnosis not present

## 2021-07-23 DIAGNOSIS — D689 Coagulation defect, unspecified: Secondary | ICD-10-CM | POA: Diagnosis not present

## 2021-07-23 DIAGNOSIS — N186 End stage renal disease: Secondary | ICD-10-CM | POA: Diagnosis not present

## 2021-07-23 DIAGNOSIS — N2581 Secondary hyperparathyroidism of renal origin: Secondary | ICD-10-CM | POA: Diagnosis not present

## 2021-07-23 DIAGNOSIS — Z992 Dependence on renal dialysis: Secondary | ICD-10-CM | POA: Diagnosis not present

## 2021-07-23 DIAGNOSIS — D631 Anemia in chronic kidney disease: Secondary | ICD-10-CM | POA: Diagnosis not present

## 2021-07-25 DIAGNOSIS — D631 Anemia in chronic kidney disease: Secondary | ICD-10-CM | POA: Diagnosis not present

## 2021-07-25 DIAGNOSIS — D689 Coagulation defect, unspecified: Secondary | ICD-10-CM | POA: Diagnosis not present

## 2021-07-25 DIAGNOSIS — N2581 Secondary hyperparathyroidism of renal origin: Secondary | ICD-10-CM | POA: Diagnosis not present

## 2021-07-25 DIAGNOSIS — N186 End stage renal disease: Secondary | ICD-10-CM | POA: Diagnosis not present

## 2021-07-25 DIAGNOSIS — Z992 Dependence on renal dialysis: Secondary | ICD-10-CM | POA: Diagnosis not present

## 2021-07-27 DIAGNOSIS — D631 Anemia in chronic kidney disease: Secondary | ICD-10-CM | POA: Diagnosis not present

## 2021-07-27 DIAGNOSIS — N2581 Secondary hyperparathyroidism of renal origin: Secondary | ICD-10-CM | POA: Diagnosis not present

## 2021-07-27 DIAGNOSIS — Z992 Dependence on renal dialysis: Secondary | ICD-10-CM | POA: Diagnosis not present

## 2021-07-27 DIAGNOSIS — N186 End stage renal disease: Secondary | ICD-10-CM | POA: Diagnosis not present

## 2021-07-27 DIAGNOSIS — D689 Coagulation defect, unspecified: Secondary | ICD-10-CM | POA: Diagnosis not present

## 2021-07-30 DIAGNOSIS — N186 End stage renal disease: Secondary | ICD-10-CM | POA: Diagnosis not present

## 2021-07-30 DIAGNOSIS — N2581 Secondary hyperparathyroidism of renal origin: Secondary | ICD-10-CM | POA: Diagnosis not present

## 2021-07-30 DIAGNOSIS — D631 Anemia in chronic kidney disease: Secondary | ICD-10-CM | POA: Diagnosis not present

## 2021-07-30 DIAGNOSIS — D689 Coagulation defect, unspecified: Secondary | ICD-10-CM | POA: Diagnosis not present

## 2021-07-30 DIAGNOSIS — Z992 Dependence on renal dialysis: Secondary | ICD-10-CM | POA: Diagnosis not present

## 2021-08-01 DIAGNOSIS — D631 Anemia in chronic kidney disease: Secondary | ICD-10-CM | POA: Diagnosis not present

## 2021-08-01 DIAGNOSIS — N186 End stage renal disease: Secondary | ICD-10-CM | POA: Diagnosis not present

## 2021-08-01 DIAGNOSIS — D689 Coagulation defect, unspecified: Secondary | ICD-10-CM | POA: Diagnosis not present

## 2021-08-01 DIAGNOSIS — N2581 Secondary hyperparathyroidism of renal origin: Secondary | ICD-10-CM | POA: Diagnosis not present

## 2021-08-01 DIAGNOSIS — Z992 Dependence on renal dialysis: Secondary | ICD-10-CM | POA: Diagnosis not present

## 2021-08-03 DIAGNOSIS — N2581 Secondary hyperparathyroidism of renal origin: Secondary | ICD-10-CM | POA: Diagnosis not present

## 2021-08-03 DIAGNOSIS — N186 End stage renal disease: Secondary | ICD-10-CM | POA: Diagnosis not present

## 2021-08-03 DIAGNOSIS — D631 Anemia in chronic kidney disease: Secondary | ICD-10-CM | POA: Diagnosis not present

## 2021-08-03 DIAGNOSIS — D689 Coagulation defect, unspecified: Secondary | ICD-10-CM | POA: Diagnosis not present

## 2021-08-03 DIAGNOSIS — Z992 Dependence on renal dialysis: Secondary | ICD-10-CM | POA: Diagnosis not present

## 2021-08-06 DIAGNOSIS — D689 Coagulation defect, unspecified: Secondary | ICD-10-CM | POA: Diagnosis not present

## 2021-08-06 DIAGNOSIS — N186 End stage renal disease: Secondary | ICD-10-CM | POA: Diagnosis not present

## 2021-08-06 DIAGNOSIS — Z992 Dependence on renal dialysis: Secondary | ICD-10-CM | POA: Diagnosis not present

## 2021-08-06 DIAGNOSIS — N2581 Secondary hyperparathyroidism of renal origin: Secondary | ICD-10-CM | POA: Diagnosis not present

## 2021-08-06 DIAGNOSIS — D631 Anemia in chronic kidney disease: Secondary | ICD-10-CM | POA: Diagnosis not present

## 2021-08-08 DIAGNOSIS — N186 End stage renal disease: Secondary | ICD-10-CM | POA: Diagnosis not present

## 2021-08-08 DIAGNOSIS — Z992 Dependence on renal dialysis: Secondary | ICD-10-CM | POA: Diagnosis not present

## 2021-08-08 DIAGNOSIS — D631 Anemia in chronic kidney disease: Secondary | ICD-10-CM | POA: Diagnosis not present

## 2021-08-08 DIAGNOSIS — D689 Coagulation defect, unspecified: Secondary | ICD-10-CM | POA: Diagnosis not present

## 2021-08-08 DIAGNOSIS — N2581 Secondary hyperparathyroidism of renal origin: Secondary | ICD-10-CM | POA: Diagnosis not present

## 2021-08-10 DIAGNOSIS — N2581 Secondary hyperparathyroidism of renal origin: Secondary | ICD-10-CM | POA: Diagnosis not present

## 2021-08-10 DIAGNOSIS — N186 End stage renal disease: Secondary | ICD-10-CM | POA: Diagnosis not present

## 2021-08-10 DIAGNOSIS — D631 Anemia in chronic kidney disease: Secondary | ICD-10-CM | POA: Diagnosis not present

## 2021-08-10 DIAGNOSIS — Z992 Dependence on renal dialysis: Secondary | ICD-10-CM | POA: Diagnosis not present

## 2021-08-10 DIAGNOSIS — D689 Coagulation defect, unspecified: Secondary | ICD-10-CM | POA: Diagnosis not present

## 2021-08-13 DIAGNOSIS — Z992 Dependence on renal dialysis: Secondary | ICD-10-CM | POA: Diagnosis not present

## 2021-08-13 DIAGNOSIS — N186 End stage renal disease: Secondary | ICD-10-CM | POA: Diagnosis not present

## 2021-08-13 DIAGNOSIS — N2581 Secondary hyperparathyroidism of renal origin: Secondary | ICD-10-CM | POA: Diagnosis not present

## 2021-08-13 DIAGNOSIS — D689 Coagulation defect, unspecified: Secondary | ICD-10-CM | POA: Diagnosis not present

## 2021-08-13 DIAGNOSIS — D631 Anemia in chronic kidney disease: Secondary | ICD-10-CM | POA: Diagnosis not present

## 2021-08-15 DIAGNOSIS — D689 Coagulation defect, unspecified: Secondary | ICD-10-CM | POA: Diagnosis not present

## 2021-08-15 DIAGNOSIS — Z992 Dependence on renal dialysis: Secondary | ICD-10-CM | POA: Diagnosis not present

## 2021-08-15 DIAGNOSIS — N2581 Secondary hyperparathyroidism of renal origin: Secondary | ICD-10-CM | POA: Diagnosis not present

## 2021-08-15 DIAGNOSIS — N186 End stage renal disease: Secondary | ICD-10-CM | POA: Diagnosis not present

## 2021-08-15 DIAGNOSIS — I129 Hypertensive chronic kidney disease with stage 1 through stage 4 chronic kidney disease, or unspecified chronic kidney disease: Secondary | ICD-10-CM | POA: Diagnosis not present

## 2021-08-15 DIAGNOSIS — D631 Anemia in chronic kidney disease: Secondary | ICD-10-CM | POA: Diagnosis not present

## 2021-08-17 DIAGNOSIS — D631 Anemia in chronic kidney disease: Secondary | ICD-10-CM | POA: Diagnosis not present

## 2021-08-17 DIAGNOSIS — D689 Coagulation defect, unspecified: Secondary | ICD-10-CM | POA: Diagnosis not present

## 2021-08-17 DIAGNOSIS — N2581 Secondary hyperparathyroidism of renal origin: Secondary | ICD-10-CM | POA: Diagnosis not present

## 2021-08-17 DIAGNOSIS — Z992 Dependence on renal dialysis: Secondary | ICD-10-CM | POA: Diagnosis not present

## 2021-08-17 DIAGNOSIS — N186 End stage renal disease: Secondary | ICD-10-CM | POA: Diagnosis not present

## 2021-08-20 ENCOUNTER — Other Ambulatory Visit: Payer: Self-pay | Admitting: Cardiovascular Disease

## 2021-08-20 ENCOUNTER — Telehealth: Payer: Self-pay | Admitting: Cardiovascular Disease

## 2021-08-20 DIAGNOSIS — Z992 Dependence on renal dialysis: Secondary | ICD-10-CM | POA: Diagnosis not present

## 2021-08-20 DIAGNOSIS — N186 End stage renal disease: Secondary | ICD-10-CM | POA: Diagnosis not present

## 2021-08-20 DIAGNOSIS — N2581 Secondary hyperparathyroidism of renal origin: Secondary | ICD-10-CM | POA: Diagnosis not present

## 2021-08-20 DIAGNOSIS — D631 Anemia in chronic kidney disease: Secondary | ICD-10-CM | POA: Diagnosis not present

## 2021-08-20 DIAGNOSIS — D689 Coagulation defect, unspecified: Secondary | ICD-10-CM | POA: Diagnosis not present

## 2021-08-20 NOTE — Telephone Encounter (Signed)
I called and spoke with the patient's daughter, Roger [name] (ok per DPR). She advised that the patient has recently revealed to her that he is having symptoms of SOB/ chest pain that occurs when he lays on his right side. He is also having elevated BP's at HD (MWF).  Roger [name] advised that she recently tried to look at his medications on [employer] and she thinks he may be out of some of his RX's as the refills expired. She does not know how long he has been out of meds or exactly which ones.   She spoke with the patient today and his SBP at HD was over 200.   The patient was last seen in 04/05/20 with Dr. Fletcher Anon.  Roger [name] advised she lives ~ 1 hour away, but she has tried to set the patient up with a pill box and schedule/ chart for his medications so he can manage his meds.   I have advised Roger [name] that we need to know exactly what medications he has at home and what he is out of. She will have someone go by his house and let her know exactly what he has at home.  She will also obtain his BP readings.  I have asked her to send [place] a mychart message tomorrow of his current medications on hand and his BP readings so we know what to refill/ what recommendations to obtain from Dr. Fletcher Anon.   The patient was previously prescribed midodrine to take twice daily as needed on HD days, but no specific parameters were given.  Will await a follow up mychart message from [place] and go from there.   Roger [name] voices understanding and is agreeable.

## 2021-08-20 NOTE — Telephone Encounter (Signed)
Pt c/o of Chest Pain: STAT if CP now or developed within 24 hours  1. Are you having CP right now? No   2. Are you experiencing any other symptoms (ex. SOB, nausea, vomiting, sweating)? SOB and hypertension   3. How long have you been experiencing CP? At least 2-3 weeks   4. Is your CP continuous or coming and going? Coming and going   5. Have you taken Nitroglycerin? No  ?   Daughter is calling stating the patient advised her 2 weeks ago that he has been having CP at night when he lays on hir right side and reports it is hard for him to breathe. She also states he has been having hypertension, his systolic today was over 725 at dialysis today. She reports the patient has not been taking any of his heart medications recently due to them being expired. Patient has also been having CP and SOB when doing dialysis, but that has been an ongoing thing that she reports Dr. Fletcher Anon is aware of. Patient has been scheduled for a f/u on 06/13 with Christell Faith. [name] states she can put the pt on three way when calling back if need be. Please advise.

## 2021-08-21 ENCOUNTER — Other Ambulatory Visit: Payer: Self-pay | Admitting: *Deleted

## 2021-08-21 MED ORDER — CLOPIDOGREL BISULFATE 75 MG PO TABS: 75.0000 mg | ORAL_TABLET | Freq: Every day | ORAL | 0 refills | Status: DC

## 2021-08-21 NOTE — Telephone Encounter (Signed)
Please advise If ok to refill. Pt hasn't been seen in office.

## 2021-08-22 DIAGNOSIS — N2581 Secondary hyperparathyroidism of renal origin: Secondary | ICD-10-CM | POA: Diagnosis not present

## 2021-08-22 DIAGNOSIS — D631 Anemia in chronic kidney disease: Secondary | ICD-10-CM | POA: Diagnosis not present

## 2021-08-22 DIAGNOSIS — Z992 Dependence on renal dialysis: Secondary | ICD-10-CM | POA: Diagnosis not present

## 2021-08-22 DIAGNOSIS — D689 Coagulation defect, unspecified: Secondary | ICD-10-CM | POA: Diagnosis not present

## 2021-08-22 DIAGNOSIS — N186 End stage renal disease: Secondary | ICD-10-CM | POA: Diagnosis not present

## 2021-08-23 NOTE — Telephone Encounter (Signed)
Mychart message sent to [facility] to follow up on the patient's medications & blood pressures.

## 2021-08-24 DIAGNOSIS — Z992 Dependence on renal dialysis: Secondary | ICD-10-CM | POA: Diagnosis not present

## 2021-08-24 DIAGNOSIS — D689 Coagulation defect, unspecified: Secondary | ICD-10-CM | POA: Diagnosis not present

## 2021-08-24 DIAGNOSIS — N186 End stage renal disease: Secondary | ICD-10-CM | POA: Diagnosis not present

## 2021-08-24 DIAGNOSIS — N2581 Secondary hyperparathyroidism of renal origin: Secondary | ICD-10-CM | POA: Diagnosis not present

## 2021-08-24 DIAGNOSIS — D631 Anemia in chronic kidney disease: Secondary | ICD-10-CM | POA: Diagnosis not present

## 2021-08-27 DIAGNOSIS — N2581 Secondary hyperparathyroidism of renal origin: Secondary | ICD-10-CM | POA: Diagnosis not present

## 2021-08-27 DIAGNOSIS — N186 End stage renal disease: Secondary | ICD-10-CM | POA: Diagnosis not present

## 2021-08-27 DIAGNOSIS — D631 Anemia in chronic kidney disease: Secondary | ICD-10-CM | POA: Diagnosis not present

## 2021-08-27 DIAGNOSIS — D689 Coagulation defect, unspecified: Secondary | ICD-10-CM | POA: Diagnosis not present

## 2021-08-27 DIAGNOSIS — Z992 Dependence on renal dialysis: Secondary | ICD-10-CM | POA: Diagnosis not present

## 2021-08-27 NOTE — Progress Notes (Signed)
Cardiology Office Note    Date:  08/28/2021   ID:  Roger [name], DOB April 08, 1940, MRN [id number]  PCP:  Lujean Amel, MD  Cardiologist:  [name], MD  Electrophysiologist:  None   Chief Complaint: Chest pain and elevated blood pressure  History of Present Illness:   Roger [name] is a 81 y.o. male with history of CAD status post PCI/DES to the LAD in 2018, PAF not on [place] secondary to history of GI bleeding, HFpEF, ESRD on HD (TTS) since 10/2016, PAD, carotid artery disease, anemia of chronic disease, prostate cancer, HTN, HLD, and tobacco use who presents for evaluation of chest pain and elevated blood pressure.   He was admitted to the hospital in 10/2016 with NSTEMI in the setting of GI bleed with anemia.  Diagnostic LHC showed CTO of the RCA with left-to-right collaterals as well as a CTO of the LCx which was a small vessel.  The mid LAD had a 70% stenosis.  There were no good targets for bypass surgery, and in this setting, he subsequently underwent staged PCI/DES to the LAD in 11/2016.  Echo at that time showed normal LV systolic function.  He later developed recurrent GI bleeding on DAPT and has been on monotherapy with Plavix since.  He was admitted to the hospital in 07/2017 with chest pain, ruling out.  Echo showed normal LV systolic function.  Initially, there was concern for possible vegetations on his aortic and mitral valves, however upon further evaluation it was felt the valves were heavily calcified and not significantly changed from prior echoes.  Stress testing in 07/2017 and again in 09/2018, performed in the setting of chest pain, was nonischemic.  The chest pain as noted in 09/2018 was during dialysis.  He was seen in the office in 02/2019 and was doing well, without chest pain.  His midodrine had been increased to 10 mg twice daily on dialysis days.  He was also having to hold metoprolol and Imdur on the morning of dialysis, and with these changes he had not been  experiencing significant hypotension or lightheadedness with HD.  He did continue to note significant fatigue following HD.  Carotid artery ultrasound in 02/2019 showed stable bilateral ICA 40 to 59% stenosis and antegrade flow in the bilateral vertebral arteries.  He was referred to the [facility] kidney transplant program and deemed to not be a candidate for transplant due to age, comorbid conditions, and lack of a living donor.   He was seen in the office on 09/24/2019 and noted an increase in chest pain and SOB occurring with hemodialysis with associated episodes of hypotension. He was intermittently requiring supplemental oxygen with his dialysis and continued to have abdominal spasms during hemodialysis. He underwent echo on 10/29/2019, that showed an EF of 60-65%, no RWMA, Gr1DD, mild LVH, normal RVSF and ventricular cavity size, moderate mitral regurgitation, and mild aortic valve insufficiency.     He was admitted to the hospital in 10/2019 with suspected gallstone pancreatitis and underwent laparoscopic cholecystectomy.   He was last seen in the office in 03/2020 and was doing reasonably well, noting chest pain only when his blood pressure dropped during dialysis, though usually improved with oxygen therapy.  We received a phone call on 08/20/2021 the patient had reported episodes of shortness of breath and chest pain when he was laying on his right side along with elevated BP readings during hemodialysis.  There was some concern the patient had been out of some of  his medications.  He reported his blood pressure has been over 200 during hemodialysis recently.  He comes in accompanied by his daughter today and is doing reasonably well from a cardiac perspective.  He does report sensations of chest discomfort and reflux that typically occur in the afternoon hours and when he is laying supine on his right side.  He also continues to note some episodes of chest discomfort with low blood pressures during  hemodialysis.  Pain is not exertional.  He also reports significant fluctuations in his blood pressure during hemodialysis with one reading over 200.  He also reports episodes of significant hypotension with hemodialysis.  He typically starts his dialysis session sitting up, though will lay supine throughout his session.  He is uncertain if there is a correlation between his body position and blood pressure readings in the context of ongoing midodrine use.  He did suffer a fall this past weekend without syncope.  Currently chest pain-free.   Labs independently reviewed: 05/2020 - Hgb 10.4, PLT 166, TC 150, TG 216, HDL 36, LDL 78 11/2019 - BUN 30, serum creatinine 6.3, potassium 4.9, albumin 4.6, AST/ALT not elevated  Past Medical History:  Diagnosis Date   Anemia    Anginal pain ([place])    Arthritis    "all over; bad in the legs" (12/04/2016)   CAD (coronary artery disease)    a. NSTEMI in setting of anemia; b. 10/2016 MV: reversible ant, apical, inflat defect; c. 10/2016 Cath: LM nl, LAD 40p, 22m D1 70, D2 80, RI min irregs, LCX 100ost, RCA 100p, 1074m, RPDA fills via collats from LAD, RPAV small-->Initial conservative Rx in setting of GIB w/ plan for PCI LAD if H/H stable on ASA/Plavix;  d. 12/04/16 s/p PTCA and DES to mLAD; e. 09/2018 MV: EF 54%, no ischemia.   Carotid disease, bilateral ([facility] [place]   a. 04/2017 U/S: RICA 4038-46RCCA >5>65LICA 4099-35  Depression    situational    Diastolic dysfunction    a. 10/2016 Echo: EF 55-60%, no rwma, Gr2 DD, triv MR, mildly dil LA; b. 07/2017 Echo: EF 60-65%, Gr1 DD. No rwma. AoV leaflet thickening (not felt to be SBE upon further review). Mild MR. Mildly dil LA.   [facility]   [place]; [address]; TTS usually; [place] [facility] [place] staying w/daughter" (12/04/2016)   GERD (gastroesophageal reflux disease)    GIB (gastrointestinal bleeding) 10/2011   a. 10/2016 3u PRBCs - EGD/Colonoscopy: angiodysplasia w/ diverticulosis.  No apparent bleeding. Polypecotmy->tubular adenomas.   Gout    Headache    no migraines for years   Heart murmur    High cholesterol    History of blood transfusion 10/18/2016   "related to anemia" (12/04/2016)   History of kidney stones    years ago   Hypertension    Iron deficiency anemia    Myocardial infarction ([facility]/05/2016   Old MI (myocardial infarction)    "found evidence of this on 10/18/2016"   PAF (paroxysmal atrial fibrillation) ([facility]   Peripheral vascular disease (H[facility])    BLE   Prostate cancer ([facility]   a. s/p seed implantation.   Tobacco abuse     Past Surgical History:  Procedure Laterality Date   AV FISTULA PLACEMENT Right 03/28/2017   Procedure: ARTERIOVENOUS (AV) BRACHIOCEPHALIC FISTULA CREATION;  Surgeon: DiAngelia MouldMD;  [place] [facility] Service: Vascular;  Laterality: Right;   CARDIAC CATHETERIZATION  10/2016   CHOLECYSTECTOMY N/A 11/03/2019   Procedure: LAPAROSCOPIC CHOLECYSTECTOMY;  Surgeon: Clovis Riley, MD;  Location: [place];  Service: General;  Laterality: N/A;   COLONOSCOPY WITH PROPOFOL N/A 10/21/2016   Procedure: COLONOSCOPY WITH PROPOFOL;  Surgeon: Gatha Mayer, MD;  Location: [facility];  Service: Endoscopy;  Laterality: N/A;   CORONARY ANGIOPLASTY WITH STENT PLACEMENT  12/04/2016   "1 stent"   CORONARY STENT INTERVENTION N/A 12/04/2016   Procedure: CORONARY STENT INTERVENTION;  Surgeon: Wellington Hampshire, MD;  Location: [place] CV LAB;  Service: Cardiovascular;  Laterality: N/A;   ESOPHAGOGASTRODUODENOSCOPY N/A 10/21/2016   Procedure: ESOPHAGOGASTRODUODENOSCOPY (EGD);  Surgeon: Gatha Mayer, MD;  Location: [facility] ENDOSCOPY;  Service: Endoscopy;  Laterality: N/A;   INSERTION OF DIALYSIS CATHETER Right 10/22/2016   Procedure: INSERTION OF TUNNELED DIALYSIS CATHETER;  Surgeon: Elam Dutch, MD;  Location: [facility];  Service: Vascular;  Laterality: Right;   INTRAOPERATIVE CHOLANGIOGRAM N/A 11/03/2019   Procedure: ATTEMPTED  INTRAOPERATIVE CHOLANGIOGRAM;  Surgeon: Clovis Riley, MD;  Location: [facility];  Service: General;  Laterality: N/A;   LEFT HEART CATH AND CORONARY ANGIOGRAPHY N/A 10/23/2016   Procedure: LEFT HEART CATH AND CORONARY ANGIOGRAPHY;  Surgeon: Wellington Hampshire, MD;  Location: [place] CV LAB;  Service: Cardiovascular;  Laterality: N/A;   LIGATION OF ARTERIOVENOUS  FISTULA Right 05/12/2017   Procedure: BANDING OF RIGHT UPPER ARM ARTERIOVENOUS  FISTULA USING 6MM X 10CM GORE TEX VASCULAR GRAFT;  Surgeon: Angelia Mould, MD;  Location: [facility];  Service: Vascular;  Laterality: Right;   LIGATION OF ARTERIOVENOUS  FISTULA Right 07/03/2017   Procedure: LIGATION OF ARTERIOVENOUS  FISTULA RIGHT ARM;  Surgeon: Angelia Mould, MD;  Location: [facility] OR;  Service: Vascular;  Laterality: Right;   TONSILLECTOMY      Current Medications: Current Meds  Medication Sig   acetaminophen (TYLENOL) 325 MG tablet Take 2 tablets (650 mg total) by mouth every 6 (six) hours as needed for mild pain.   allopurinol (ZYLOPRIM) 100 MG tablet Take 1 tablet (100 mg total) by mouth daily.   atorvastatin (LIPITOR) 80 MG tablet TAKE 1 TABLET(80 MG) BY MOUTH DAILY AT 6 PM   b complex vitamins tablet Take 1 tablet by mouth at bedtime.    calcitRIOL (ROCALTROL) 0.25 MCG capsule Take 1 capsule (0.25 mcg total) by mouth Every Tuesday,Thursday,and Saturday with dialysis.   clopidogrel (PLAVIX) 75 MG tablet Take 1 tablet (75 mg total) by mouth daily.   ezetimibe (ZETIA) 10 MG tablet TAKE 1 TABLET(10 MG) BY MOUTH DAILY   ferric citrate (AURYXIA) 1 GM 210 MG(Fe) tablet Take 630 mg by mouth 2 (two) times daily.   isosorbide mononitrate (IMDUR) 30 MG 24 hr tablet Take 1 tablet (30 mg) by mouth once daily in the evening   midodrine (PROAMATINE) 10 MG tablet TAKE 1 TABLET BY MOUTH TWICE DAILY AS NEEDED(TWICE DAILY ON HEMODIALYSIS DAYS)   nitroGLYCERIN (NITROSTAT) 0.4 MG SL tablet Place 1 tablet (0.4 mg total) under the tongue every 5  (five) minutes as needed for chest pain.   pantoprazole (PROTONIX) 40 MG tablet Take 40 mg by mouth 2 (two) times daily.    tamsulosin (FLOMAX) 0.4 MG CAPS capsule Take 0.4 mg by mouth daily.    Allergies:   Patient has no known allergies.   Social History   Socioeconomic History   Marital status: Widowed    Spouse name: Not on file   Number of children: Not on file  Years of education: Not on file   Highest education level: Not on file  Occupational History   Not on file  Tobacco Use   Smoking status: Former    Packs/day: 0.50    Years: 50.00    Total pack years: 25.00    Types: Cigarettes    Quit date: 10/18/2016    Years since quitting: 4.8   Smokeless tobacco: Never  Vaping Use   Vaping Use: Former  Substance and Sexual Activity   Alcohol use: No   Drug use: No   Sexual activity: Never    Birth control/protection: Abstinence  Other Topics Concern   Not on file  Social History Narrative   Not on file   Social Determinants of Health   Financial Resource Strain: Not on file  Food Insecurity: Not on file  Transportation Needs: Not on file  Physical Activity: Not on file  Stress: Not on file  Social Connections: Not on file     Family History:  The patient's family history includes Cancer in his brother, father, mother, and sister; Diabetes in his brother and father; Heart attack in his father; Hypertension in his brother, daughter, and father.  ROS:   12-point review of systems is negative unless otherwise noted in HPI.   EKGs/Labs/Other Studies Reviewed:    Studies reviewed were summarized above. The additional studies were reviewed today:  Carotid artery ultrasound 03/20/2020: Summary:  Right Carotid: Velocities in the right ICA are consistent with a 40-59% stenosis. Non-hemodynamically significant plaque <50% noted in the CCA.   Left Carotid: Velocities in the left ICA are consistent with a 40-59%  stenosis. Non-hemodynamically significant plaque <50%  noted in the  CCA.   Vertebrals:  Right vertebral artery demonstrates antegrade flow. Atypical antegrade flow noted in the left vertebral artery. Subclavians: Bilateral subclavian arteries were stenotic. Bilateral subclavian artery flow was disturbed. __________  2D echo 10/29/2019: 1. Left ventricular ejection fraction, by estimation, is 60 to 65%. The  left ventricle has normal function. The left ventricle has no regional  wall motion abnormalities. There is mild left ventricular hypertrophy.  Left ventricular diastolic parameters  are consistent with Grade I diastolic dysfunction (impaired relaxation).   2. Right ventricular systolic function is normal. The right ventricular  size is normal. Tricuspid regurgitation signal is inadequate for assessing  PA pressure.   3. Moderate mitral valve regurgitation.   4. Aortic valve regurgitation is mild.  __________   [facility] MPI 09/2018: Pharmacological myocardial perfusion imaging study with no significant  ischemia EF estimated at 54% Inferior wall hypokinesis, likely secondary to GI uptake artifact/attenuation correction processing No EKG changes concerning for ischemia at peak stress or in recovery. CT attenuation images with three-vessel coronary calcification and mild to moderate diffuse aortic atherosclerosis Low risk scan __________   2D echo 07/2017: - Procedure narrative: Transthoracic echocardiography. Image    quality was suboptimal. Technically difficult study.  - Left ventricle: The cavity size was normal. Wall thickness was    increased in a pattern of mild LVH. Systolic function was normal.    The estimated ejection fraction was in the range of 60% to 65%.    Wall motion was normal; there were no regional wall motion    abnormalities. Doppler parameters are consistent with abnormal    left ventricular relaxation (grade 1 diastolic dysfunction). The    E/e&' ratio is between 8-15, suggesting indeterminate LV filling     pressure.  - Aortic valve: Thickening and increased echogenicity of  the    leaflet tips, consistent with calcification +/- endocarditis.    There is also a mass, more clearly noted in off-axis views of the    valve - this seems to move with the leaflet and likely is a    vegetation given the clinical scenario, although there is no    significant stenosis - which would be expected given some of the    measurements obtained. Trivial regurgitation. Further evaluation    with TEE is recommended.  - Mitral valve: MAC with thickened leaflets. There is a mobile    structure associated with the subvalvular apparatus in the LV,    which may be a partially flail cord. There is a mass on the    anterior leaflet which likely represents endocarditis. Further    evaluation with TEE is recommended. There is mild regurgitation.    There was mild regurgitation.  - Left atrium: The atrium was mildly dilated.  - Inferior vena cava: The vessel was normal in size. The    respirophasic diameter changes were in the normal range (= 50%),    consistent with normal central venous pressure.   Impressions:   - Technically difficult study, although extensive effort to    visualize aortic and mitral valves. There are likely vegetations    of both valves given the clinical scenario, consistent with    endocarditis. LVEF 60-65%. TEE is recommended to confirm the    diagnosis. There may be a partially flail subvalvular cord of the    mitral apparatus.  __________   LHC 11/2016: Ost Cx to Prox Cx lesion, 100 %stenosed. Prox RCA lesion, 100 %stenosed. Prox LAD lesion, 40 %stenosed. Ost 1st Diag to 1st Diag lesion, 70 %stenosed. Mid RCA to Dist RCA lesion, 100 %stenosed. Mid LAD lesion, 80 %stenosed. Post intervention, there is a 0% residual stenosis. A stent was successfully placed. Ost 2nd Diag to 2nd Diag lesion, 60 %stenosed.   Successful angioplasty and drug-eluting stent placement to the mid LAD.    Recommendations: Dual antiplatelet therapy for at least 6 months. Aggressive treatment of risk factors. The patient's dialysis schedule is Tuesdays, Thursdays and Saturdays.  The patient seems to have significant disease affecting the right common femoral artery. Recommend an outpatient lower extremity arterial Doppler. __________   LHC 10/2016: Ost Cx to Prox Cx lesion, 100 %stenosed. Prox RCA lesion, 100 %stenosed. Prox LAD lesion, 40 %stenosed. Mid LAD lesion, 70 %stenosed. Ost 1st Diag to 1st Diag lesion, 70 %stenosed. Ost 2nd Diag to 2nd Diag lesion, 80 %stenosed. Mid RCA to Dist RCA lesion, 100 %stenosed.   1. Significant three-vessel coronary artery disease. Chronically occluded right coronary artery with left to right collaterals supplying the right PDA. Chronically occluded proximal left circumflex which seems to be a small vessel overall with some left to left collaterals. Most of the lateral wall seems to be supplied by a large ramus branch. The LAD has a discrete 70% stenosis in a tortuous midsegment . The coronary arteries are overall moderately calcified and extremely tortuous likely due to hypertensive heart disease.   2. Moderately elevated left ventricular end-diastolic pressure. Left ventricular angiography was not performed due to kidney disease. EF was normal by echo.   Recommendations: I don't think there woud be significant advantage from CABG given that the disease in the LAD does not seem to be critical and the only other graftable vessel would be the right PDA. The OMs seem to be very small and diffusely diseased.  The best option is probably to treat medically for now and consider mid LAD PCI in the near future (few months) depending on his clinical course and after making sure he recovers from current illnesses including GI bleed and renal failure.    EKG:  EKG is ordered today.  The EKG ordered today demonstrates NSR, 62 bpm, first-degree AV block, rare PVC, no  acute ST-T changes  Recent Labs: No results found for requested labs within last 365 days.  Recent Lipid Panel    Component Value Date/Time   CHOL 131 05/12/2019 1134   CHOL 198 03/05/2019 1526   TRIG 161 (H) 05/12/2019 1134   HDL 31 (L) 05/12/2019 1134   HDL 33 (L) 03/05/2019 1526   CHOLHDL 4.2 05/12/2019 1134   VLDL 32 05/12/2019 1134   LDLCALC 68 05/12/2019 1134   LDLCALC 125 (H) 03/05/2019 1526    PHYSICAL EXAM:    VS:  BP (!) 100/58 (BP Location: Left Arm, Patient Position: Sitting, Cuff Size: Normal)   Pulse 62   Ht _0  (1.702 m)   Wt 163 lb 6.4 oz (74.1 kg)   SpO2 100%   BMI 25.59 kg/m   BMI: Body mass index is 25.59 kg/m.  Physical Exam Vitals reviewed.  Constitutional:      Appearance: He is well-developed.  HENT:     Head: Normocephalic and atraumatic.  Eyes:     General:        Right eye: No discharge.        Left eye: No discharge.  Neck:     Vascular: No JVD.  Cardiovascular:     Rate and Rhythm: Normal rate and regular rhythm.     Pulses:          Carotid pulses are  on the right side with bruit and  on the left side with bruit.      Posterior tibial pulses are 2+ on the right side and 2+ on the left side.     Heart sounds: S1 normal and S2 normal. Heart sounds not distant. No midsystolic click and no opening snap. Murmur heard.     Systolic murmur is present with a grade of 2/6 at the upper right sternal border.     No friction rub.  Pulmonary:     Effort: Pulmonary effort is normal. No respiratory distress.     Breath sounds: Normal breath sounds. No decreased breath sounds, wheezing or rales.  Chest:     Chest wall: No tenderness.  Abdominal:     General: There is no distension.     Palpations: Abdomen is soft.     Tenderness: There is no abdominal tenderness.  Musculoskeletal:     Cervical back: Normal range of motion.     Right lower leg: No edema.     Left lower leg: No edema.  Skin:    General: Skin is warm and dry.     Nails:  There is no clubbing.  Neurological:     Mental Status: He is alert and oriented to person, place, and time.  Psychiatric:        Speech: Speech normal.        Behavior: Behavior normal.        Thought Content: Thought content normal.        Judgment: Judgment normal.     Wt Readings from Last 3 Encounters:  08/28/21 163 lb 6.4 oz (74.1 kg)  04/05/20 175 lb (79.4 kg)  01/03/20 175  lb (79.4 kg)     ASSESSMENT & PLAN:   CAD involving the native coronary arteries with stable angina: Currently chest pain-free.  Schedule Lexiscan MPI to evaluate for high risk ischemia.  He is tolerating clopidogrel monotherapy without recurrent GI bleed.  Unable to escalate antianginal therapy in the setting of chronic hypotension and ESRD.  Labile HTN: Blood pressure is on the softer side in the office today.  I do wonder if some of his elevated BP readings are in the context of supine hypertension associated with midodrine.  I have encouraged him to sit up during hemodialysis sessions to see if this helps with his elevated BP readings.  No changes were made to medical therapy.  HLD: LDL 78.  He remains on atorvastatin and ezetimibe.  PAD: No symptoms of lifestyle limiting claudication.  Continue medical therapy.  Moderate bilateral carotid artery disease: Last imaging from 03/2020 demonstrated bilateral 40 to 59% ICA stenoses.  Discussed follow-up imaging at next visit.  This was not the focus of today's visit.   Shared Decision Making/Informed Consent{  The risks [chest pain, shortness of breath, cardiac arrhythmias, dizziness, blood pressure fluctuations, myocardial infarction, stroke/transient ischemic attack, nausea, vomiting, allergic reaction, radiation exposure, metallic taste sensation and life-threatening complications (estimated to be 1 in 10,000)], benefits (risk stratification, diagnosing coronary artery disease, treatment guidance) and alternatives of a nuclear stress test were discussed in  detail with Mr. Falck and he agrees to proceed.     Disposition: F/u with Dr. Fletcher Anon or an APP in 2 months.   Medication Adjustments/Labs and Tests Ordered: Current medicines are reviewed at length with the patient today.  Concerns regarding medicines are outlined above. Medication changes, Labs and Tests ordered today are summarized above and listed in the Patient Instructions accessible in Encounters.   Signed, Christell Faith, PA-C 08/28/2021 4:07 PM     [address] [place], [place] [address] [phone]

## 2021-08-28 ENCOUNTER — Encounter: Payer: Self-pay | Admitting: Physician Assistant

## 2021-08-28 ENCOUNTER — Ambulatory Visit (INDEPENDENT_AMBULATORY_CARE_PROVIDER_SITE_OTHER): Payer: Medicare Other | Admitting: Physician Assistant

## 2021-08-28 VITALS — BP 100/58 | HR 62 | Ht 67.0 in | Wt 163.4 lb

## 2021-08-28 DIAGNOSIS — I779 Disorder of arteries and arterioles, unspecified: Secondary | ICD-10-CM | POA: Diagnosis not present

## 2021-08-28 DIAGNOSIS — I739 Peripheral vascular disease, unspecified: Secondary | ICD-10-CM

## 2021-08-28 DIAGNOSIS — I25118 Atherosclerotic heart disease of native coronary artery with other forms of angina pectoris: Secondary | ICD-10-CM

## 2021-08-28 DIAGNOSIS — R0989 Other specified symptoms and signs involving the circulatory and respiratory systems: Secondary | ICD-10-CM

## 2021-08-28 DIAGNOSIS — E785 Hyperlipidemia, unspecified: Secondary | ICD-10-CM | POA: Diagnosis not present

## 2021-08-28 NOTE — Telephone Encounter (Signed)
Pt seen today for appointment with Christell Faith, PA-C. See note for further details.

## 2021-08-28 NOTE — Patient Instructions (Signed)
Medication Instructions:   Your physician recommends that you continue on your current medications as directed. Please refer to the Current Medication list given to you today.  *If you need a refill on your cardiac medications before your next appointment, please call your pharmacy*   Lab Work:  None ordered  Testing/Procedures:  [place]  Your caregiver has ordered a Stress Test with nuclear imaging. The purpose of this test is to evaluate the blood supply to your heart muscle. This procedure is referred to as a "Non-Invasive Stress Test." This is because other than having an IV started in your vein, nothing is inserted or "invades" your body. Cardiac stress tests are done to find areas of poor blood flow to the heart by determining the extent of coronary artery disease (CAD). Some patients exercise on a treadmill, which naturally increases the blood flow to your heart, while others who are  unable to walk on a treadmill due to physical limitations have a pharmacologic/chemical stress agent called Lexiscan . This medicine will mimic walking on a treadmill by temporarily increasing your coronary blood flow.   Please note: these test may take anywhere between 2-4 hours to complete  PLEASE REPORT TO [place] AT THE FIRST DESK WILL DIRECT YOU WHERE TO GO  Date of Procedure:_____________________________________  Arrival Time for Procedure:______________________________   PLEASE NOTIFY THE OFFICE AT LEAST 24 HOURS IN ADVANCE IF YOU ARE UNABLE TO KEEP YOUR APPOINTMENT.  [phone] AND  PLEASE NOTIFY NUCLEAR MEDICINE AT [facility] AT LEAST 24 HOURS IN ADVANCE IF YOU ARE UNABLE TO KEEP YOUR APPOINTMENT. [phone]  How to prepare for your Myoview test:  Do not eat or drink after midnight No caffeine for 24 hours prior to test No smoking 24 hours prior to test. Your medication may be taken with water.  If your doctor stopped a medication because of this  test, do not take that medication. Please wear a short sleeve shirt. No perfume, cologne or lotion.   Follow-Up: At [facility], you and your health needs are our priority.  As part of our continuing mission to provide you with exceptional heart care, we have created designated Provider Care Teams.  These Care Teams include your primary Cardiologist (physician) and Advanced Practice Providers (APPs -  Physician Assistants and Nurse Practitioners) who all work together to provide you with the care you need, when you need it.  We recommend signing up for the patient portal called "MyChart".  Sign up information is provided on this After Visit Summary.  MyChart is used to connect with patients for Virtual Visits (Telemedicine).  Patients are able to view lab/test results, encounter notes, upcoming appointments, etc.  Non-urgent messages can be sent to your provider as well.   To learn more about what you can do with MyChart, go to [email or web address].    Your next appointment:    Follow up after lexiscan   The format for your next appointment:   In Person  Provider:   Christell Faith, PA-C{  Important Information About Sugar

## 2021-08-29 DIAGNOSIS — N2581 Secondary hyperparathyroidism of renal origin: Secondary | ICD-10-CM | POA: Diagnosis not present

## 2021-08-29 DIAGNOSIS — Z992 Dependence on renal dialysis: Secondary | ICD-10-CM | POA: Diagnosis not present

## 2021-08-29 DIAGNOSIS — N186 End stage renal disease: Secondary | ICD-10-CM | POA: Diagnosis not present

## 2021-08-29 DIAGNOSIS — D689 Coagulation defect, unspecified: Secondary | ICD-10-CM | POA: Diagnosis not present

## 2021-08-29 DIAGNOSIS — D631 Anemia in chronic kidney disease: Secondary | ICD-10-CM | POA: Diagnosis not present

## 2021-08-31 DIAGNOSIS — N2581 Secondary hyperparathyroidism of renal origin: Secondary | ICD-10-CM | POA: Diagnosis not present

## 2021-08-31 DIAGNOSIS — N186 End stage renal disease: Secondary | ICD-10-CM | POA: Diagnosis not present

## 2021-08-31 DIAGNOSIS — D631 Anemia in chronic kidney disease: Secondary | ICD-10-CM | POA: Diagnosis not present

## 2021-08-31 DIAGNOSIS — Z992 Dependence on renal dialysis: Secondary | ICD-10-CM | POA: Diagnosis not present

## 2021-08-31 DIAGNOSIS — D689 Coagulation defect, unspecified: Secondary | ICD-10-CM | POA: Diagnosis not present

## 2021-09-03 DIAGNOSIS — N2581 Secondary hyperparathyroidism of renal origin: Secondary | ICD-10-CM | POA: Diagnosis not present

## 2021-09-03 DIAGNOSIS — D689 Coagulation defect, unspecified: Secondary | ICD-10-CM | POA: Diagnosis not present

## 2021-09-03 DIAGNOSIS — Z992 Dependence on renal dialysis: Secondary | ICD-10-CM | POA: Diagnosis not present

## 2021-09-03 DIAGNOSIS — D631 Anemia in chronic kidney disease: Secondary | ICD-10-CM | POA: Diagnosis not present

## 2021-09-03 DIAGNOSIS — N186 End stage renal disease: Secondary | ICD-10-CM | POA: Diagnosis not present

## 2021-09-05 DIAGNOSIS — Z992 Dependence on renal dialysis: Secondary | ICD-10-CM | POA: Diagnosis not present

## 2021-09-05 DIAGNOSIS — N2581 Secondary hyperparathyroidism of renal origin: Secondary | ICD-10-CM | POA: Diagnosis not present

## 2021-09-05 DIAGNOSIS — N186 End stage renal disease: Secondary | ICD-10-CM | POA: Diagnosis not present

## 2021-09-05 DIAGNOSIS — D689 Coagulation defect, unspecified: Secondary | ICD-10-CM | POA: Diagnosis not present

## 2021-09-05 DIAGNOSIS — D631 Anemia in chronic kidney disease: Secondary | ICD-10-CM | POA: Diagnosis not present

## 2021-09-07 DIAGNOSIS — D689 Coagulation defect, unspecified: Secondary | ICD-10-CM | POA: Diagnosis not present

## 2021-09-07 DIAGNOSIS — D631 Anemia in chronic kidney disease: Secondary | ICD-10-CM | POA: Diagnosis not present

## 2021-09-07 DIAGNOSIS — N2581 Secondary hyperparathyroidism of renal origin: Secondary | ICD-10-CM | POA: Diagnosis not present

## 2021-09-07 DIAGNOSIS — N186 End stage renal disease: Secondary | ICD-10-CM | POA: Diagnosis not present

## 2021-09-07 DIAGNOSIS — Z992 Dependence on renal dialysis: Secondary | ICD-10-CM | POA: Diagnosis not present

## 2021-09-10 DIAGNOSIS — Z992 Dependence on renal dialysis: Secondary | ICD-10-CM | POA: Diagnosis not present

## 2021-09-10 DIAGNOSIS — D689 Coagulation defect, unspecified: Secondary | ICD-10-CM | POA: Diagnosis not present

## 2021-09-10 DIAGNOSIS — N186 End stage renal disease: Secondary | ICD-10-CM | POA: Diagnosis not present

## 2021-09-10 DIAGNOSIS — N2581 Secondary hyperparathyroidism of renal origin: Secondary | ICD-10-CM | POA: Diagnosis not present

## 2021-09-10 DIAGNOSIS — D631 Anemia in chronic kidney disease: Secondary | ICD-10-CM | POA: Diagnosis not present

## 2021-09-12 DIAGNOSIS — Z992 Dependence on renal dialysis: Secondary | ICD-10-CM | POA: Diagnosis not present

## 2021-09-12 DIAGNOSIS — D689 Coagulation defect, unspecified: Secondary | ICD-10-CM | POA: Diagnosis not present

## 2021-09-12 DIAGNOSIS — D631 Anemia in chronic kidney disease: Secondary | ICD-10-CM | POA: Diagnosis not present

## 2021-09-12 DIAGNOSIS — N2581 Secondary hyperparathyroidism of renal origin: Secondary | ICD-10-CM | POA: Diagnosis not present

## 2021-09-12 DIAGNOSIS — N186 End stage renal disease: Secondary | ICD-10-CM | POA: Diagnosis not present

## 2021-09-14 DIAGNOSIS — N186 End stage renal disease: Secondary | ICD-10-CM | POA: Diagnosis not present

## 2021-09-14 DIAGNOSIS — D631 Anemia in chronic kidney disease: Secondary | ICD-10-CM | POA: Diagnosis not present

## 2021-09-14 DIAGNOSIS — D689 Coagulation defect, unspecified: Secondary | ICD-10-CM | POA: Diagnosis not present

## 2021-09-14 DIAGNOSIS — I129 Hypertensive chronic kidney disease with stage 1 through stage 4 chronic kidney disease, or unspecified chronic kidney disease: Secondary | ICD-10-CM | POA: Diagnosis not present

## 2021-09-14 DIAGNOSIS — Z992 Dependence on renal dialysis: Secondary | ICD-10-CM | POA: Diagnosis not present

## 2021-09-14 DIAGNOSIS — N2581 Secondary hyperparathyroidism of renal origin: Secondary | ICD-10-CM | POA: Diagnosis not present

## 2021-09-17 DIAGNOSIS — D689 Coagulation defect, unspecified: Secondary | ICD-10-CM | POA: Diagnosis not present

## 2021-09-17 DIAGNOSIS — Z992 Dependence on renal dialysis: Secondary | ICD-10-CM | POA: Diagnosis not present

## 2021-09-17 DIAGNOSIS — D631 Anemia in chronic kidney disease: Secondary | ICD-10-CM | POA: Diagnosis not present

## 2021-09-17 DIAGNOSIS — N2581 Secondary hyperparathyroidism of renal origin: Secondary | ICD-10-CM | POA: Diagnosis not present

## 2021-09-17 DIAGNOSIS — N186 End stage renal disease: Secondary | ICD-10-CM | POA: Diagnosis not present

## 2021-09-19 DIAGNOSIS — Z992 Dependence on renal dialysis: Secondary | ICD-10-CM | POA: Diagnosis not present

## 2021-09-19 DIAGNOSIS — N2581 Secondary hyperparathyroidism of renal origin: Secondary | ICD-10-CM | POA: Diagnosis not present

## 2021-09-19 DIAGNOSIS — N186 End stage renal disease: Secondary | ICD-10-CM | POA: Diagnosis not present

## 2021-09-19 DIAGNOSIS — D631 Anemia in chronic kidney disease: Secondary | ICD-10-CM | POA: Diagnosis not present

## 2021-09-19 DIAGNOSIS — D689 Coagulation defect, unspecified: Secondary | ICD-10-CM | POA: Diagnosis not present

## 2021-09-21 DIAGNOSIS — D631 Anemia in chronic kidney disease: Secondary | ICD-10-CM | POA: Diagnosis not present

## 2021-09-21 DIAGNOSIS — D689 Coagulation defect, unspecified: Secondary | ICD-10-CM | POA: Diagnosis not present

## 2021-09-21 DIAGNOSIS — N2581 Secondary hyperparathyroidism of renal origin: Secondary | ICD-10-CM | POA: Diagnosis not present

## 2021-09-21 DIAGNOSIS — Z992 Dependence on renal dialysis: Secondary | ICD-10-CM | POA: Diagnosis not present

## 2021-09-21 DIAGNOSIS — N186 End stage renal disease: Secondary | ICD-10-CM | POA: Diagnosis not present

## 2021-09-24 ENCOUNTER — Other Ambulatory Visit: Payer: Self-pay | Admitting: Cardiovascular Disease

## 2021-09-24 DIAGNOSIS — N186 End stage renal disease: Secondary | ICD-10-CM | POA: Diagnosis not present

## 2021-09-24 DIAGNOSIS — N2581 Secondary hyperparathyroidism of renal origin: Secondary | ICD-10-CM | POA: Diagnosis not present

## 2021-09-24 DIAGNOSIS — D689 Coagulation defect, unspecified: Secondary | ICD-10-CM | POA: Diagnosis not present

## 2021-09-24 DIAGNOSIS — D631 Anemia in chronic kidney disease: Secondary | ICD-10-CM | POA: Diagnosis not present

## 2021-09-24 DIAGNOSIS — Z992 Dependence on renal dialysis: Secondary | ICD-10-CM | POA: Diagnosis not present

## 2021-09-25 ENCOUNTER — Encounter
Admission: RE | Admit: 2021-09-25 | Discharge: 2021-09-25 | Disposition: A | Discharge status: Home / Self Care | Payer: Medicare Other | Source: Ambulatory Visit | Attending: Physician Assistant | Admitting: Physician Assistant

## 2021-09-25 DIAGNOSIS — I25118 Atherosclerotic heart disease of native coronary artery with other forms of angina pectoris: Secondary | ICD-10-CM | POA: Insufficient documentation

## 2021-09-25 LAB — NM MYOCAR MULTI W/SPECT W/WALL MOTION / EF
LV dias vol: 126 mL (ref 62–150)
LV sys vol: 49 mL
Nuc Stress EF: 61 %
Peak HR: 65 {beats}/min
Percent HR: 45 %
Rest HR: 44 {beats}/min
Rest Nuclear Isotope Dose: 10.1 mCi
SDS: 1
SRS: 11
SSS: 8
Stress Nuclear Isotope Dose: 22.2 mCi
TID: 1.05

## 2021-09-25 MED ORDER — TECHNETIUM TC 99M TETROFOSMIN IV KIT
32.2400 | PACK | Freq: Once | INTRAVENOUS | Status: AC | PRN
  Administered 2021-09-25: 32.24 via INTRAVENOUS

## 2021-09-25 MED ORDER — REGADENOSON 0.4 MG/5ML IV SOLN
0.4000 mg | Freq: Once | INTRAVENOUS | Status: AC
  Administered 2021-09-25: 0.4 mg via INTRAVENOUS

## 2021-09-25 MED ORDER — TECHNETIUM TC 99M TETROFOSMIN IV KIT
10.0500 | PACK | Freq: Once | INTRAVENOUS | Status: AC | PRN
  Administered 2021-09-25: 10.05 via INTRAVENOUS

## 2021-09-26 DIAGNOSIS — D631 Anemia in chronic kidney disease: Secondary | ICD-10-CM | POA: Diagnosis not present

## 2021-09-26 DIAGNOSIS — N2581 Secondary hyperparathyroidism of renal origin: Secondary | ICD-10-CM | POA: Diagnosis not present

## 2021-09-26 DIAGNOSIS — N186 End stage renal disease: Secondary | ICD-10-CM | POA: Diagnosis not present

## 2021-09-26 DIAGNOSIS — D689 Coagulation defect, unspecified: Secondary | ICD-10-CM | POA: Diagnosis not present

## 2021-09-26 DIAGNOSIS — Z992 Dependence on renal dialysis: Secondary | ICD-10-CM | POA: Diagnosis not present

## 2021-09-28 DIAGNOSIS — N2581 Secondary hyperparathyroidism of renal origin: Secondary | ICD-10-CM | POA: Diagnosis not present

## 2021-09-28 DIAGNOSIS — N186 End stage renal disease: Secondary | ICD-10-CM | POA: Diagnosis not present

## 2021-09-28 DIAGNOSIS — D631 Anemia in chronic kidney disease: Secondary | ICD-10-CM | POA: Diagnosis not present

## 2021-09-28 DIAGNOSIS — D689 Coagulation defect, unspecified: Secondary | ICD-10-CM | POA: Diagnosis not present

## 2021-09-28 DIAGNOSIS — Z992 Dependence on renal dialysis: Secondary | ICD-10-CM | POA: Diagnosis not present

## 2021-10-01 DIAGNOSIS — Z992 Dependence on renal dialysis: Secondary | ICD-10-CM | POA: Diagnosis not present

## 2021-10-01 DIAGNOSIS — N2581 Secondary hyperparathyroidism of renal origin: Secondary | ICD-10-CM | POA: Diagnosis not present

## 2021-10-01 DIAGNOSIS — D689 Coagulation defect, unspecified: Secondary | ICD-10-CM | POA: Diagnosis not present

## 2021-10-01 DIAGNOSIS — D631 Anemia in chronic kidney disease: Secondary | ICD-10-CM | POA: Diagnosis not present

## 2021-10-01 DIAGNOSIS — N186 End stage renal disease: Secondary | ICD-10-CM | POA: Diagnosis not present

## 2021-10-01 NOTE — Progress Notes (Unsigned)
Cardiology Office Note    Date:  10/02/2021   ID:  Roger [name], DOB [birth year], MRN [id number]  PCP:  Lujean Amel, MD  Cardiologist:  [name], MD  Electrophysiologist:  None   Chief Complaint: Follow-up   History of Present Illness:   Roger [name] is a 81 y.o. male with history of CAD status post PCI/DES to the LAD in 2018, PAF not on [place] secondary to history of GI bleeding, HFpEF, ESRD on HD (TTS) since 10/2016, PAD, carotid artery disease, anemia of chronic disease, prostate cancer, HTN, HLD, and tobacco use who presents for follow-up of Lexiscan MPI.   He was admitted to the hospital in 10/2016 with NSTEMI in the setting of GI bleed with anemia.  Diagnostic LHC showed CTO of the RCA with left-to-right collaterals as well as a CTO of the LCx which was a small vessel.  The mid LAD had a 70% stenosis.  There were no good targets for bypass surgery, and in this setting, he subsequently underwent staged PCI/DES to the LAD in 11/2016.  Echo at that time showed normal LV systolic function.  He later developed recurrent GI bleeding on DAPT and has been on monotherapy with Plavix since.  He was admitted to the hospital in 07/2017 with chest pain, ruling out.  Echo showed normal LV systolic function.  Initially, there was concern for possible vegetations on his aortic and mitral valves, however upon further evaluation it was felt the valves were heavily calcified and not significantly changed from prior echoes.  Stress testing in 07/2017 and again in 09/2018, performed in the setting of chest pain, was nonischemic.  The chest pain as noted in 09/2018 was during dialysis.  He was seen in the office in 02/2019 and was doing well, without chest pain.  His midodrine had been increased to 10 mg twice daily on dialysis days.  He was also having to hold metoprolol and Imdur on the morning of dialysis, and with these changes he had not been experiencing significant hypotension or lightheadedness with HD.   He did continue to note significant fatigue following HD.  Carotid artery ultrasound in 02/2019 showed stable bilateral ICA 40 to 59% stenosis and antegrade flow in the bilateral vertebral arteries.  He was referred to the [facility] kidney transplant program and deemed to not be a candidate for transplant due to age, comorbid conditions, and lack of a living donor.   He was seen in the office on 09/24/2019 and noted an increase in chest pain and SOB occurring with hemodialysis with associated episodes of hypotension. He was intermittently requiring supplemental oxygen with his dialysis and continued to have abdominal spasms during hemodialysis. He underwent echo on 10/29/2019, that showed an EF of 60-65%, no RWMA, Gr1DD, mild LVH, normal RVSF and ventricular cavity size, moderate mitral regurgitation, and mild aortic valve insufficiency.     He was admitted to the hospital in 10/2019 with suspected gallstone pancreatitis and underwent laparoscopic cholecystectomy.   He was seen in the office in 03/2020 and was doing reasonably well, noting chest pain only when his blood pressure dropped during dialysis, though usually improved with oxygen therapy.   We received a phone call on 08/20/2021 the patient had reported episodes of shortness of breath and chest pain when he was laying on his right side along with elevated BP readings during hemodialysis.  There was some concern the patient had been out of some of his medications.  He reported his blood pressure  has been over 200 during hemodialysis recently.  He was last seen in the office on 08/28/2021 and reported chest discomfort and reflux that typically occurred in the afternoon hours when laying supine on his right side.  He also continued to note episodes of chest discomfort with low blood pressures during hemodialysis.  He continued to note labile hypertension with hemodialysis.  Given symptoms, he underwent Lexiscan MPI on 09/25/2020, which was abnormal.  There was a  small in size, moderate in severity, partially reversible defect involving the basal and mid inferior segments consistent with scar and peri-infarct ischemia.  There was also a moderate in size, small to moderate in severity, partially reversible defect involving the mid anterior, mid anterolateral, apical anterior, and apical lateral segments that was most apparent on attenuation corrected images.  This was felt to most likely represent artifact, though scar and peri-infarct ischemia could not be excluded.  When compared to study from 09/2018, the mid/apical anterior/anterolateral defect was new.  LVEF estimated at 61%.  There was extensive coronary artery calcification and aortic atherosclerosis.  Overall, this was graded as an intermediate risk study.  He comes in accompanied by his daughter today and continues to note intermittent sensations of chest discomfort and reflux that largely occurred during hemodialysis.  He has not had any further supine, when laying on his right side discomfort.  He continues to have what are described as "hiccups."  He did tolerate hemodialysis sitting up for the first day, following this though he had issues with elevated blood pressures and has required supine hemodialysis thereafter.  No presyncope or syncope.  Currently chest pain-free.   Labs independently reviewed: 05/2020 - Hgb 10.4, PLT 166, TC 150, TG 216, HDL 36, LDL 78 11/2019 - BUN 30, serum creatinine 6.3, potassium 4.9, albumin 4.6, AST/ALT not elevated  Past Medical History:  Diagnosis Date   Anemia    Anginal pain ([place])    Arthritis    "all over; bad in the legs" (12/04/2016)   CAD (coronary artery disease)    a. NSTEMI in setting of anemia; b. 10/2016 MV: reversible ant, apical, inflat defect; c. 10/2016 Cath: LM nl, LAD 40p, 70m D1 70, D2 80, RI min irregs, LCX 100ost, RCA 100p, 1059m, RPDA fills via collats from LAD, RPAV small-->Initial conservative Rx in setting of GIB w/ plan for PCI LAD if H/H  stable on ASA/Plavix;  d. 12/04/16 s/p PTCA and DES to mLAD; e. 09/2018 MV: EF 54%, no ischemia.   Carotid disease, bilateral ([place]   a. 04/2017 U/S: RICA [id number] >5>52LICA [id number]  Depression    situational    Diastolic dysfunction    a. 10/2016 Echo: EF 55-60%, no rwma, Gr2 DD, triv MR, mildly dil LA; b. 07/2017 Echo: EF 60-65%, Gr1 DD. No rwma. AoV leaflet thickening (not felt to be SBE upon further review). Mild MR. Mildly dil LA.   [facility]   "[place]; [address]; TTS usually; [place] [facility] [place] staying w/daughter" (12/04/2016)   GERD (gastroesophageal reflux disease)    GIB (gastrointestinal bleeding) 10/2011   a. 10/2016 3u PRBCs - EGD/Colonoscopy: angiodysplasia w/ diverticulosis. No apparent bleeding. Polypecotmy->tubular adenomas.   Gout    Headache    no migraines for years   Heart murmur    High cholesterol    History of blood transfusion 10/18/2016   "related to anemia" (12/04/2016)   History of kidney stones  years ago   Hypertension    Iron deficiency anemia    Myocardial infarction ([place]) 10/18/2016   Old MI (myocardial infarction)    "found evidence of this on 10/18/2016"   PAF (paroxysmal atrial fibrillation) ([place])    Peripheral vascular disease ([place])    BLE   Prostate cancer ([place])    a. s/p seed implantation.   Tobacco abuse     Past Surgical History:  Procedure Laterality Date   AV FISTULA PLACEMENT Right 03/28/2017   Procedure: ARTERIOVENOUS (AV) BRACHIOCEPHALIC FISTULA CREATION;  Surgeon: Angelia Mould, MD;  Location: [facility];  Service: Vascular;  Laterality: Right;   CARDIAC CATHETERIZATION  10/2016   CHOLECYSTECTOMY N/A 11/03/2019   Procedure: LAPAROSCOPIC CHOLECYSTECTOMY;  Surgeon: Clovis Riley, MD;  Location: [place] [facility];  Service: General;  Laterality: N/A;   COLONOSCOPY WITH PROPOFOL N/A 10/21/2016   Procedure: COLONOSCOPY WITH PROPOFOL;  Surgeon: Gatha Mayer, MD;  Location: [facility];  Service:  Endoscopy;  Laterality: N/A;   CORONARY ANGIOPLASTY WITH STENT PLACEMENT  12/04/2016   "1 stent"   CORONARY STENT INTERVENTION N/A 12/04/2016   Procedure: CORONARY STENT INTERVENTION;  Surgeon: Wellington Hampshire, MD;  Location: [place];  Service: Cardiovascular;  Laterality: N/A;   ESOPHAGOGASTRODUODENOSCOPY N/A 10/21/2016   Procedure: ESOPHAGOGASTRODUODENOSCOPY (EGD);  Surgeon: Gatha Mayer, MD;  Location: [facility];  Service: Endoscopy;  Laterality: N/A;   INSERTION OF DIALYSIS CATHETER Right 10/22/2016   Procedure: INSERTION OF TUNNELED DIALYSIS CATHETER;  Surgeon: Elam Dutch, MD;  Location: [place];  Service: Vascular;  Laterality: Right;   INTRAOPERATIVE CHOLANGIOGRAM N/A 11/03/2019   Procedure: ATTEMPTED INTRAOPERATIVE CHOLANGIOGRAM;  Surgeon: Clovis Riley, MD;  Location: [place];  Service: General;  Laterality: N/A;   LEFT HEART CATH AND CORONARY ANGIOGRAPHY N/A 10/23/2016   Procedure: LEFT HEART CATH AND CORONARY ANGIOGRAPHY;  Surgeon: Wellington Hampshire, MD;  Location: [place] [facility];  Service: Cardiovascular;  Laterality: N/A;   LIGATION OF ARTERIOVENOUS  FISTULA Right 05/12/2017   Procedure: BANDING OF RIGHT UPPER ARM ARTERIOVENOUS  FISTULA USING 6MM X 10CM GORE TEX VASCULAR GRAFT;  Surgeon: Angelia Mould, MD;  Location: [facility];  Service: Vascular;  Laterality: Right;   LIGATION OF ARTERIOVENOUS  FISTULA Right 07/03/2017   Procedure: LIGATION OF ARTERIOVENOUS  FISTULA RIGHT ARM;  Surgeon: Angelia Mould, MD;  Location: [facility] OR;  Service: Vascular;  Laterality: Right;   TONSILLECTOMY      Current Medications: Current Meds  Medication Sig   acetaminophen (TYLENOL) 325 MG tablet Take 2 tablets (650 mg total) by mouth every 6 (six) hours as needed for mild pain.   allopurinol (ZYLOPRIM) 100 MG tablet Take 1 tablet (100 mg total) by mouth daily.   atorvastatin (LIPITOR) 80 MG tablet TAKE 1 TABLET(80 MG) BY MOUTH DAILY AT 6 PM   b complex vitamins tablet  Take 1 tablet by mouth at bedtime.    calcitRIOL (ROCALTROL) 0.25 MCG capsule Take 1 capsule (0.25 mcg total) by mouth Every Tuesday,Thursday,and Saturday with dialysis.   clopidogrel (PLAVIX) 75 MG tablet Take 1 tablet (75 mg total) by mouth daily.   ezetimibe (ZETIA) 10 MG tablet TAKE 1 TABLET(10 MG) BY MOUTH DAILY   ferric citrate (AURYXIA) 1 GM 210 MG(Fe) tablet Take 630 mg by mouth 2 (two) times daily.   isosorbide mononitrate (IMDUR) 30 MG 24 hr tablet Take 1 tablet (30 mg) by mouth once daily in the evening   midodrine (PROAMATINE) 10  MG tablet TAKE 1 TABLET BY MOUTH TWICE DAILY AS NEEDED(TWICE DAILY ON HEMODIALYSIS DAYS)   nitroGLYCERIN (NITROSTAT) 0.4 MG SL tablet Place 1 tablet (0.4 mg total) under the tongue every 5 (five) minutes as needed for chest pain.   pantoprazole (PROTONIX) 40 MG tablet Take 40 mg by mouth 2 (two) times daily.    tamsulosin (FLOMAX) 0.4 MG CAPS capsule Take 0.4 mg by mouth daily.    Allergies:   Patient has no known allergies.   Social History   Socioeconomic History   Marital status: Widowed    Spouse name: Not on file   Number of children: Not on file   Years of education: Not on file   Highest education level: Not on file  Occupational History   Not on file  Tobacco Use   Smoking status: Former    Packs/day: 0.50    Years: 50.00    Total pack years: 25.00    Types: Cigarettes    Quit date: 10/18/2016    Years since quitting: 4.9   Smokeless tobacco: Never  Vaping Use   Vaping Use: Former  Substance and Sexual Activity   Alcohol use: No   Drug use: No   Sexual activity: Never    Birth control/protection: Abstinence  Other Topics Concern   Not on file  Social History Narrative   Not on file   Social Determinants of Health   Financial Resource Strain: Not on file  Food Insecurity: Not on file  Transportation Needs: Not on file  Physical Activity: Not on file  Stress: Not on file  Social Connections: Not on file     Family  History:  The patient's family history includes Cancer in his brother, father, mother, and sister; Diabetes in his brother and father; Heart attack in his father; Hypertension in his brother, daughter, and father.  ROS:   12-point review of systems is negative unless otherwise noted in the HPI.   EKGs/Labs/Other Studies Reviewed:    Studies reviewed were summarized above. The additional studies were reviewed today:  Lexiscan MPI 09/25/2021:   Abnormal pharmacologic myocardial perfusion stress test.   There is a small in size, moderate in severity, partially reversible defect involving the basal and mid inferior segments consistent with scar and periinfarct ischemia.   There is a moderate in size, mild to moderate in severity, partially reversible defect involving the mid anterior, mid anterolateral, apical anterior, and apical lateral segments most apparent on the attenuation corrected images.  This most likely represents artifact but cannot rule out scar and periinfarct ischemia.   Left ventricular systolic function is normal by visual estimation and Siemens calculation (LVEF 61%).  QGC calculation is likely artifactually low due to gating parameters.   There is extensive coronary artery calcification and aortic atherosclerosis.   A central venous catheter terminates in the right atrium.   Compared to the prior study from 10/02/2018, the mid/apical anterior/anterolateral defect is new.   This is an intermediate risk study. __________  Carotid artery ultrasound 03/20/2020: Summary:  Right Carotid: Velocities in the right ICA are consistent with a 40-59% stenosis. Non-hemodynamically significant plaque <50% noted in the CCA.   Left Carotid: Velocities in the left ICA are consistent with a 40-59%  stenosis. Non-hemodynamically significant plaque <50% noted in the  CCA.   Vertebrals:  Right vertebral artery demonstrates antegrade flow. Atypical antegrade flow noted in the left vertebral  artery. Subclavians: Bilateral subclavian arteries were stenotic. Bilateral subclavian artery flow was disturbed. __________  2D echo 10/29/2019: 1. Left ventricular ejection fraction, by estimation, is 60 to 65%. The  left ventricle has normal function. The left ventricle has no regional  wall motion abnormalities. There is mild left ventricular hypertrophy.  Left ventricular diastolic parameters  are consistent with Grade I diastolic dysfunction (impaired relaxation).   2. Right ventricular systolic function is normal. The right ventricular  size is normal. Tricuspid regurgitation signal is inadequate for assessing  PA pressure.   3. Moderate mitral valve regurgitation.   4. Aortic valve regurgitation is mild.  __________   Carlton Adam MPI 09/2018: Pharmacological myocardial perfusion imaging study with no significant  ischemia EF estimated at 54% Inferior wall hypokinesis, likely secondary to GI uptake artifact/attenuation correction processing No EKG changes concerning for ischemia at peak stress or in recovery. CT attenuation images with three-vessel coronary calcification and mild to moderate diffuse aortic atherosclerosis Low risk scan __________   2D echo 07/2017: - Procedure narrative: Transthoracic echocardiography. Image    quality was suboptimal. Technically difficult study.  - Left ventricle: The cavity size was normal. Wall thickness was    increased in a pattern of mild LVH. Systolic function was normal.    The estimated ejection fraction was in the range of 60% to 65%.    Wall motion was normal; there were no regional wall motion    abnormalities. Doppler parameters are consistent with abnormal    left ventricular relaxation (grade 1 diastolic dysfunction). The    E/e&' ratio is between 8-15, suggesting indeterminate LV filling    pressure.  - Aortic valve: Thickening and increased echogenicity of the    leaflet tips, consistent with calcification +/- endocarditis.     There is also a mass, more clearly noted in off-axis views of the    valve - this seems to move with the leaflet and likely is a    vegetation given the clinical scenario, although there is no    significant stenosis - which would be expected given some of the    measurements obtained. Trivial regurgitation. Further evaluation    with TEE is recommended.  - Mitral valve: MAC with thickened leaflets. There is a mobile    structure associated with the subvalvular apparatus in the LV,    which may be a partially flail cord. There is a mass on the    anterior leaflet which likely represents endocarditis. Further    evaluation with TEE is recommended. There is mild regurgitation.    There was mild regurgitation.  - Left atrium: The atrium was mildly dilated.  - Inferior vena cava: The vessel was normal in size. The    respirophasic diameter changes were in the normal range (= 50%),    consistent with normal central venous pressure.   Impressions:   - Technically difficult study, although extensive effort to    visualize aortic and mitral valves. There are likely vegetations    of both valves given the clinical scenario, consistent with    endocarditis. LVEF 60-65%. TEE is recommended to confirm the    diagnosis. There may be a partially flail subvalvular cord of the    mitral apparatus.  __________   LHC 11/2016: Ost Cx to Prox Cx lesion, 100 %stenosed. Prox RCA lesion, 100 %stenosed. Prox LAD lesion, 40 %stenosed. Ost 1st Diag to 1st Diag lesion, 70 %stenosed. Mid RCA to Dist RCA lesion, 100 %stenosed. Mid LAD lesion, 80 %stenosed. Post intervention, there is a 0% residual stenosis. A stent was successfully placed.  Ost 2nd Diag to 2nd Diag lesion, 60 %stenosed.   Successful angioplasty and drug-eluting stent placement to the mid LAD.   Recommendations: Dual antiplatelet therapy for at least 6 months. Aggressive treatment of risk factors. The patient's dialysis schedule is  Tuesdays, Thursdays and Saturdays.  The patient seems to have significant disease affecting the right common femoral artery. Recommend an outpatient lower extremity arterial Doppler. __________   LHC 10/2016: Ost Cx to Prox Cx lesion, 100 %stenosed. Prox RCA lesion, 100 %stenosed. Prox LAD lesion, 40 %stenosed. Mid LAD lesion, 70 %stenosed. Ost 1st Diag to 1st Diag lesion, 70 %stenosed. Ost 2nd Diag to 2nd Diag lesion, 80 %stenosed. Mid RCA to Dist RCA lesion, 100 %stenosed.   1. Significant three-vessel coronary artery disease. Chronically occluded right coronary artery with left to right collaterals supplying the right PDA. Chronically occluded proximal left circumflex which seems to be a small vessel overall with some left to left collaterals. Most of the lateral wall seems to be supplied by a large ramus branch. The LAD has a discrete 70% stenosis in a tortuous midsegment . The coronary arteries are overall moderately calcified and extremely tortuous likely due to hypertensive heart disease.   2. Moderately elevated left ventricular end-diastolic pressure. Left ventricular angiography was not performed due to kidney disease. EF was normal by echo.   Recommendations: I don't think there woud be significant advantage from CABG given that the disease in the LAD does not seem to be critical and the only other graftable vessel would be the right PDA. The OMs seem to be very small and diffusely diseased. The best option is probably to treat medically for now and consider mid LAD PCI in the near future (few months) depending on his clinical course and after making sure he recovers from current illnesses including GI bleed and renal failure.   EKG:  EKG is ordered today.  The EKG ordered today demonstrates sinus bradycardia, 57 bpm, first-degree AV block, lateral ST-T changes, largely consistent with prior tracing  Recent Labs: 10/02/2021: BUN 30; Creatinine, Ser 7.56; Hemoglobin 10.4; Platelets  156; Potassium 4.1; Sodium 140  Recent Lipid Panel    Component Value Date/Time   CHOL 131 05/12/2019 1134   CHOL 198 03/05/2019 1526   TRIG 161 (H) 05/12/2019 1134   HDL 31 (L) 05/12/2019 1134   HDL 33 (L) 03/05/2019 1526   CHOLHDL 4.2 05/12/2019 1134   VLDL 32 05/12/2019 1134   LDLCALC 68 05/12/2019 1134   LDLCALC 125 (H) 03/05/2019 1526    PHYSICAL EXAM:    VS:  BP 130/80 (BP Location: Right Arm, Patient Position: Sitting, Cuff Size: Normal)   Pulse (!) 57   Ht _0  (1.702 m)   Wt 161 lb (73 kg)   SpO2 98%   BMI 25.22 kg/m   BMI: Body mass index is 25.22 kg/m.  Physical Exam Vitals reviewed.  Constitutional:      Appearance: He is well-developed.  HENT:     Head: Normocephalic and atraumatic.  Eyes:     General:        Right eye: No discharge.        Left eye: No discharge.  Neck:     Vascular: No JVD.  Cardiovascular:     Rate and Rhythm: Normal rate and regular rhythm.     Pulses:          Carotid pulses are  on the right side with bruit and  on the left side with  bruit.    Heart sounds: S1 normal and S2 normal. Heart sounds not distant. No midsystolic click and no opening snap. Murmur heard.     Systolic murmur is present with a grade of 2/6 at the upper right sternal border.     No friction rub.  Pulmonary:     Effort: Pulmonary effort is normal. No respiratory distress.     Breath sounds: Normal breath sounds. No decreased breath sounds, wheezing or rales.  Chest:     Chest wall: No tenderness.  Abdominal:     General: There is no distension.     Palpations: Abdomen is soft.     Tenderness: There is no abdominal tenderness.  Musculoskeletal:     Cervical back: Normal range of motion.     Right lower leg: No edema.     Left lower leg: No edema.  Skin:    General: Skin is warm and dry.     Nails: There is no clubbing.  Neurological:     Mental Status: He is alert and oriented to person, place, and time.  Psychiatric:        Speech: Speech normal.         Behavior: Behavior normal.        Thought Content: Thought content normal.        Judgment: Judgment normal.     Wt Readings from Last 3 Encounters:  10/02/21 161 lb (73 kg)  08/28/21 163 lb 6.4 oz (74.1 kg)  04/05/20 175 lb (79.4 kg)     ASSESSMENT & PLAN:   CAD involving the native coronary arteries with other forms of angina and intermediate risk Lexiscan MPI: Currently without angina.  Given ongoing episodes of angina and in the setting of abnormal nuclear stress, particular along the apical region in the setting of prior LAD stenting, test we will plan to pursue LHC.  He will otherwise continue aggressive risk factor modification and aggressive secondary prevention including clopidogrel, atorvastatin, and Imdur.  Labile hypertension: Blood pressure is stable in the office today.  Continue current therapy including Imdur and midodrine with hemodialysis.  HLD: LDL 78.  He remains on atorvastatin and ezetimibe.  PAD: No symptoms of lifestyle limiting claudication.  Continue medical therapy.  This was not discussed in detail at today's visit.  Moderate bilateral carotid artery disease: Last imaging from 03/2020 demonstrated bilateral 40 to 59% ICA stenoses.  He remains on clopidogrel and atorvastatin.  Update carotid artery ultrasound.   Shared Decision Making/Informed Consent{  The risks [stroke (1 in 1000), death (1 in 1000), kidney failure [usually temporary] (1 in 500), bleeding (1 in 200), allergic reaction [possibly serious] (1 in 200)], benefits (diagnostic support and management of coronary artery disease) and alternatives of a cardiac catheterization were discussed in detail with Mr. Pertuit and he is willing to proceed.     Disposition: F/u with Dr. Fletcher Anon or an APP after testing.   Medication Adjustments/Labs and Tests Ordered: Current medicines are reviewed at length with the patient today.  Concerns regarding medicines are outlined above. Medication changes, Labs  and Tests ordered today are summarized above and listed in the Patient Instructions accessible in Encounters.   Signed, Christell Faith, PA-C 10/02/2021 4:57 PM     [address] [place], [place] [id number] [phone]

## 2021-10-01 NOTE — H&P (View-Only) (Signed)
Cardiology Office Note    Date:  10/02/2021   ID:  BRYSTEN REISTER, DOB August 26, 1940, MRN [id number]  PCP:  Lujean Amel, MD  Cardiologist:  [name], MD  Electrophysiologist:  None   Chief Complaint: Follow-up   History of Present Illness:   Roger [name] is a 81 y.o. male with history of CAD status post PCI/DES to the LAD in 2018, PAF not on [place] secondary to history of GI bleeding, HFpEF, ESRD on HD (TTS) since 10/2016, PAD, carotid artery disease, anemia of chronic disease, prostate cancer, HTN, HLD, and tobacco use who presents for follow-up of Lexiscan MPI.   He was admitted to the hospital in 10/2016 with NSTEMI in the setting of GI bleed with anemia.  Diagnostic LHC showed CTO of the RCA with left-to-right collaterals as well as a CTO of the LCx which was a small vessel.  The mid LAD had a 70% stenosis.  There were no good targets for bypass surgery, and in this setting, he subsequently underwent staged PCI/DES to the LAD in 11/2016.  Echo at that time showed normal LV systolic function.  He later developed recurrent GI bleeding on DAPT and has been on monotherapy with Plavix since.  He was admitted to the hospital in 07/2017 with chest pain, ruling out.  Echo showed normal LV systolic function.  Initially, there was concern for possible vegetations on his aortic and mitral valves, however upon further evaluation it was felt the valves were heavily calcified and not significantly changed from prior echoes.  Stress testing in 07/2017 and again in 09/2018, performed in the setting of chest pain, was nonischemic.  The chest pain as noted in 09/2018 was during dialysis.  He was seen in the office in 02/2019 and was doing well, without chest pain.  His midodrine had been increased to 10 mg twice daily on dialysis days.  He was also having to hold metoprolol and Imdur on the morning of dialysis, and with these changes he had not been experiencing significant hypotension or lightheadedness with HD.   He did continue to note significant fatigue following HD.  Carotid artery ultrasound in 02/2019 showed stable bilateral ICA 40 to 59% stenosis and antegrade flow in the bilateral vertebral arteries.  He was referred to the [facility] kidney transplant program and deemed to not be a candidate for transplant due to age, comorbid conditions, and lack of a living donor.   He was seen in the office on 09/24/2019 and noted an increase in chest pain and SOB occurring with hemodialysis with associated episodes of hypotension. He was intermittently requiring supplemental oxygen with his dialysis and continued to have abdominal spasms during hemodialysis. He underwent echo on 10/29/2019, that showed an EF of 60-65%, no RWMA, Gr1DD, mild LVH, normal RVSF and ventricular cavity size, moderate mitral regurgitation, and mild aortic valve insufficiency.     He was admitted to the hospital in 10/2019 with suspected gallstone pancreatitis and underwent laparoscopic cholecystectomy.   He was seen in the office in 03/2020 and was doing reasonably well, noting chest pain only when his blood pressure dropped during dialysis, though usually improved with oxygen therapy.   We received a phone call on 08/20/2021 the patient had reported episodes of shortness of breath and chest pain when he was laying on his right side along with elevated BP readings during hemodialysis.  There was some concern the patient had been out of some of his medications.  He reported his blood pressure  has been over 200 during hemodialysis recently.  He was last seen in the office on 08/28/2021 and reported chest discomfort and reflux that typically occurred in the afternoon hours when laying supine on his right side.  He also continued to note episodes of chest discomfort with low blood pressures during hemodialysis.  He continued to note labile hypertension with hemodialysis.  Given symptoms, he underwent Lexiscan MPI on 09/25/2020, which was abnormal.  There was a  small in size, moderate in severity, partially reversible defect involving the basal and mid inferior segments consistent with scar and peri-infarct ischemia.  There was also a moderate in size, small to moderate in severity, partially reversible defect involving the mid anterior, mid anterolateral, apical anterior, and apical lateral segments that was most apparent on attenuation corrected images.  This was felt to most likely represent artifact, though scar and peri-infarct ischemia could not be excluded.  When compared to study from 09/2018, the mid/apical anterior/anterolateral defect was new.  LVEF estimated at 61%.  There was extensive coronary artery calcification and aortic atherosclerosis.  Overall, this was graded as an intermediate risk study.  He comes in accompanied by his daughter today and continues to note intermittent sensations of chest discomfort and reflux that largely occurred during hemodialysis.  He has not had any further supine, when laying on his right side discomfort.  He continues to have what are described as "hiccups."  He did tolerate hemodialysis sitting up for the first day, following this though he had issues with elevated blood pressures and has required supine hemodialysis thereafter.  No presyncope or syncope.  Currently chest pain-free.   Labs independently reviewed: 05/2020 - Hgb 10.4, PLT 166, TC 150, TG 216, HDL 36, LDL 78 11/2019 - BUN 30, serum creatinine 6.3, potassium 4.9, albumin 4.6, AST/ALT not elevated  Past Medical History:  Diagnosis Date   Anemia    Anginal pain ([place])    Arthritis    "all over; bad in the legs" (12/04/2016)   CAD (coronary artery disease)    a. NSTEMI in setting of anemia; b. 10/2016 MV: reversible ant, apical, inflat defect; c. 10/2016 Cath: LM nl, LAD 40p, 21m D1 70, D2 80, RI min irregs, LCX 100ost, RCA 100p, 1071m, RPDA fills via collats from LAD, RPAV small-->Initial conservative Rx in setting of GIB w/ plan for PCI LAD if H/H  stable on ASA/Plavix;  d. 12/04/16 s/p PTCA and DES to mLAD; e. 09/2018 MV: EF 54%, no ischemia.   Carotid disease, bilateral ([facility]   a. 04/2017 U/S: RICA 4003-21RCCA >5>22LICA 4048-25  Depression    situational    Diastolic dysfunction    a. 10/2016 Echo: EF 55-60%, no rwma, Gr2 DD, triv MR, mildly dil LA; b. 07/2017 Echo: EF 60-65%, Gr1 DD. No rwma. AoV leaflet thickening (not felt to be SBE upon further review). Mild MR. Mildly dil LA.   [facility]   "[place]; [address]; TTS usually; [place] [facility] [place] now while staying w/daughter" (12/04/2016)   GERD (gastroesophageal reflux disease)    GIB (gastrointestinal bleeding) 10/2011   a. 10/2016 3u PRBCs - EGD/Colonoscopy: angiodysplasia w/ diverticulosis. No apparent bleeding. Polypecotmy->tubular adenomas.   Gout    Headache    no migraines for years   Heart murmur    High cholesterol    History of blood transfusion 10/18/2016   "related to anemia" (12/04/2016)   History of kidney stones  years ago   Hypertension    Iron deficiency anemia    Myocardial infarction ([place]) 10/18/2016   Old MI (myocardial infarction)    "found evidence of this on 10/18/2016"   PAF (paroxysmal atrial fibrillation) ([place])    Peripheral vascular disease ([place])    BLE   Prostate cancer ([place])    a. s/p seed implantation.   Tobacco abuse     Past Surgical History:  Procedure Laterality Date   AV FISTULA PLACEMENT Right 03/28/2017   Procedure: ARTERIOVENOUS (AV) BRACHIOCEPHALIC FISTULA CREATION;  Surgeon: Angelia Mould, MD;  Location: [facility];  Service: Vascular;  Laterality: Right;   CARDIAC CATHETERIZATION  10/2016   CHOLECYSTECTOMY N/A 11/03/2019   Procedure: LAPAROSCOPIC CHOLECYSTECTOMY;  Surgeon: Clovis Riley, MD;  Location: [place];  Service: General;  Laterality: N/A;   COLONOSCOPY WITH PROPOFOL N/A 10/21/2016   Procedure: COLONOSCOPY WITH PROPOFOL;  Surgeon: Gatha Mayer, MD;  Location: [place];  Service:  Endoscopy;  Laterality: N/A;   CORONARY ANGIOPLASTY WITH STENT PLACEMENT  12/04/2016   "1 stent"   CORONARY STENT INTERVENTION N/A 12/04/2016   Procedure: CORONARY STENT INTERVENTION;  Surgeon: Wellington Hampshire, MD;  Location: [place] [facility];  Service: Cardiovascular;  Laterality: N/A;   ESOPHAGOGASTRODUODENOSCOPY N/A 10/21/2016   Procedure: ESOPHAGOGASTRODUODENOSCOPY (EGD);  Surgeon: Gatha Mayer, MD;  Location: [facility];  Service: Endoscopy;  Laterality: N/A;   INSERTION OF DIALYSIS CATHETER Right 10/22/2016   Procedure: INSERTION OF TUNNELED DIALYSIS CATHETER;  Surgeon: Elam Dutch, MD;  Location: [place];  Service: Vascular;  Laterality: Right;   INTRAOPERATIVE CHOLANGIOGRAM N/A 11/03/2019   Procedure: ATTEMPTED INTRAOPERATIVE CHOLANGIOGRAM;  Surgeon: Clovis Riley, MD;  Location: [place];  Service: General;  Laterality: N/A;   LEFT HEART CATH AND CORONARY ANGIOGRAPHY N/A 10/23/2016   Procedure: LEFT HEART CATH AND CORONARY ANGIOGRAPHY;  Surgeon: Wellington Hampshire, MD;  Location: [place] [facility];  Service: Cardiovascular;  Laterality: N/A;   LIGATION OF ARTERIOVENOUS  FISTULA Right 05/12/2017   Procedure: BANDING OF RIGHT UPPER ARM ARTERIOVENOUS  FISTULA USING 6MM X 10CM GORE TEX VASCULAR GRAFT;  Surgeon: Angelia Mould, MD;  Location: [place];  Service: Vascular;  Laterality: Right;   LIGATION OF ARTERIOVENOUS  FISTULA Right 07/03/2017   Procedure: LIGATION OF ARTERIOVENOUS  FISTULA RIGHT ARM;  Surgeon: Angelia Mould, MD;  Location: [facility] OR;  Service: Vascular;  Laterality: Right;   TONSILLECTOMY      Current Medications: Current Meds  Medication Sig   acetaminophen (TYLENOL) 325 MG tablet Take 2 tablets (650 mg total) by mouth every 6 (six) hours as needed for mild pain.   allopurinol (ZYLOPRIM) 100 MG tablet Take 1 tablet (100 mg total) by mouth daily.   atorvastatin (LIPITOR) 80 MG tablet TAKE 1 TABLET(80 MG) BY MOUTH DAILY AT 6 PM   b complex vitamins tablet  Take 1 tablet by mouth at bedtime.    calcitRIOL (ROCALTROL) 0.25 MCG capsule Take 1 capsule (0.25 mcg total) by mouth Every Tuesday,Thursday,and Saturday with dialysis.   clopidogrel (PLAVIX) 75 MG tablet Take 1 tablet (75 mg total) by mouth daily.   ezetimibe (ZETIA) 10 MG tablet TAKE 1 TABLET(10 MG) BY MOUTH DAILY   ferric citrate (AURYXIA) 1 GM 210 MG(Fe) tablet Take 630 mg by mouth 2 (two) times daily.   isosorbide mononitrate (IMDUR) 30 MG 24 hr tablet Take 1 tablet (30 mg) by mouth once daily in the evening   midodrine (PROAMATINE) 10  MG tablet TAKE 1 TABLET BY MOUTH TWICE DAILY AS NEEDED(TWICE DAILY ON HEMODIALYSIS DAYS)   nitroGLYCERIN (NITROSTAT) 0.4 MG SL tablet Place 1 tablet (0.4 mg total) under the tongue every 5 (five) minutes as needed for chest pain.   pantoprazole (PROTONIX) 40 MG tablet Take 40 mg by mouth 2 (two) times daily.    tamsulosin (FLOMAX) 0.4 MG CAPS capsule Take 0.4 mg by mouth daily.    Allergies:   Patient has no known allergies.   Social History   Socioeconomic History   Marital status: Widowed    Spouse name: Not on file   Number of children: Not on file   Years of education: Not on file   Highest education level: Not on file  Occupational History   Not on file  Tobacco Use   Smoking status: Former    Packs/day: 0.50    Years: 50.00    Total pack years: 25.00    Types: Cigarettes    Quit date: 10/18/2016    Years since quitting: 4.9   Smokeless tobacco: Never  Vaping Use   Vaping Use: Former  Substance and Sexual Activity   Alcohol use: No   Drug use: No   Sexual activity: Never    Birth control/protection: Abstinence  Other Topics Concern   Not on file  Social History Narrative   Not on file   Social Determinants of Health   Financial Resource Strain: Not on file  Food Insecurity: Not on file  Transportation Needs: Not on file  Physical Activity: Not on file  Stress: Not on file  Social Connections: Not on file     Family  History:  The patient's family history includes Cancer in his brother, father, mother, and sister; Diabetes in his brother and father; Heart attack in his father; Hypertension in his brother, daughter, and father.  ROS:   12-point review of systems is negative unless otherwise noted in the HPI.   EKGs/Labs/Other Studies Reviewed:    Studies reviewed were summarized above. The additional studies were reviewed today:  Lexiscan MPI 09/25/2021:   Abnormal pharmacologic myocardial perfusion stress test.   There is a small in size, moderate in severity, partially reversible defect involving the basal and mid inferior segments consistent with scar and periinfarct ischemia.   There is a moderate in size, mild to moderate in severity, partially reversible defect involving the mid anterior, mid anterolateral, apical anterior, and apical lateral segments most apparent on the attenuation corrected images.  This most likely represents artifact but cannot rule out scar and periinfarct ischemia.   Left ventricular systolic function is normal by visual estimation and Siemens calculation (LVEF 61%).  QGC calculation is likely artifactually low due to gating parameters.   There is extensive coronary artery calcification and aortic atherosclerosis.   A central venous catheter terminates in the right atrium.   Compared to the prior study from 10/02/2018, the mid/apical anterior/anterolateral defect is new.   This is an intermediate risk study. __________  Carotid artery ultrasound 03/20/2020: Summary:  Right Carotid: Velocities in the right ICA are consistent with a 40-59% stenosis. Non-hemodynamically significant plaque <50% noted in the CCA.   Left Carotid: Velocities in the left ICA are consistent with a 40-59%  stenosis. Non-hemodynamically significant plaque <50% noted in the  CCA.   Vertebrals:  Right vertebral artery demonstrates antegrade flow. Atypical antegrade flow noted in the left vertebral  artery. Subclavians: Bilateral subclavian arteries were stenotic. Bilateral subclavian artery flow was disturbed. __________  2D echo 10/29/2019: 1. Left ventricular ejection fraction, by estimation, is 60 to 65%. The  left ventricle has normal function. The left ventricle has no regional  wall motion abnormalities. There is mild left ventricular hypertrophy.  Left ventricular diastolic parameters  are consistent with Grade I diastolic dysfunction (impaired relaxation).   2. Right ventricular systolic function is normal. The right ventricular  size is normal. Tricuspid regurgitation signal is inadequate for assessing  PA pressure.   3. Moderate mitral valve regurgitation.   4. Aortic valve regurgitation is mild.  __________   [facility] Adam MPI 09/2018: Pharmacological myocardial perfusion imaging study with no significant  ischemia EF estimated at 54% Inferior wall hypokinesis, likely secondary to GI uptake artifact/attenuation correction processing No EKG changes concerning for ischemia at peak stress or in recovery. CT attenuation images with three-vessel coronary calcification and mild to moderate diffuse aortic atherosclerosis Low risk scan __________   2D echo 07/2017: - Procedure narrative: Transthoracic echocardiography. Image    quality was suboptimal. Technically difficult study.  - Left ventricle: The cavity size was normal. Wall thickness was    increased in a pattern of mild LVH. Systolic function was normal.    The estimated ejection fraction was in the range of 60% to 65%.    Wall motion was normal; there were no regional wall motion    abnormalities. Doppler parameters are consistent with abnormal    left ventricular relaxation (grade 1 diastolic dysfunction). The    E/e&' ratio is between 8-15, suggesting indeterminate LV filling    pressure.  - Aortic valve: Thickening and increased echogenicity of the    leaflet tips, consistent with calcification +/- endocarditis.     There is also a mass, more clearly noted in off-axis views of the    valve - this seems to move with the leaflet and likely is a    vegetation given the clinical scenario, although there is no    significant stenosis - which would be expected given some of the    measurements obtained. Trivial regurgitation. Further evaluation    with TEE is recommended.  - Mitral valve: MAC with thickened leaflets. There is a mobile    structure associated with the subvalvular apparatus in the LV,    which may be a partially flail cord. There is a mass on the    anterior leaflet which likely represents endocarditis. Further    evaluation with TEE is recommended. There is mild regurgitation.    There was mild regurgitation.  - Left atrium: The atrium was mildly dilated.  - Inferior vena cava: The vessel was normal in size. The    respirophasic diameter changes were in the normal range (= 50%),    consistent with normal central venous pressure.   Impressions:   - Technically difficult study, although extensive effort to    visualize aortic and mitral valves. There are likely vegetations    of both valves given the clinical scenario, consistent with    endocarditis. LVEF 60-65%. TEE is recommended to confirm the    diagnosis. There may be a partially flail subvalvular cord of the    mitral apparatus.  __________   LHC 11/2016: Ost Cx to Prox Cx lesion, 100 %stenosed. Prox RCA lesion, 100 %stenosed. Prox LAD lesion, 40 %stenosed. Ost 1st Diag to 1st Diag lesion, 70 %stenosed. Mid RCA to Dist RCA lesion, 100 %stenosed. Mid LAD lesion, 80 %stenosed. Post intervention, there is a 0% residual stenosis. A stent was successfully placed.  Ost 2nd Diag to 2nd Diag lesion, 60 %stenosed.   Successful angioplasty and drug-eluting stent placement to the mid LAD.   Recommendations: Dual antiplatelet therapy for at least 6 months. Aggressive treatment of risk factors. The patient's dialysis schedule is  Tuesdays, Thursdays and Saturdays.  The patient seems to have significant disease affecting the right common femoral artery. Recommend an outpatient lower extremity arterial Doppler. __________   LHC 10/2016: Ost Cx to Prox Cx lesion, 100 %stenosed. Prox RCA lesion, 100 %stenosed. Prox LAD lesion, 40 %stenosed. Mid LAD lesion, 70 %stenosed. Ost 1st Diag to 1st Diag lesion, 70 %stenosed. Ost 2nd Diag to 2nd Diag lesion, 80 %stenosed. Mid RCA to Dist RCA lesion, 100 %stenosed.   1. Significant three-vessel coronary artery disease. Chronically occluded right coronary artery with left to right collaterals supplying the right PDA. Chronically occluded proximal left circumflex which seems to be a small vessel overall with some left to left collaterals. Most of the lateral wall seems to be supplied by a large ramus branch. The LAD has a discrete 70% stenosis in a tortuous midsegment . The coronary arteries are overall moderately calcified and extremely tortuous likely due to hypertensive heart disease.   2. Moderately elevated left ventricular end-diastolic pressure. Left ventricular angiography was not performed due to kidney disease. EF was normal by echo.   Recommendations: I don't think there woud be significant advantage from CABG given that the disease in the LAD does not seem to be critical and the only other graftable vessel would be the right PDA. The OMs seem to be very small and diffusely diseased. The best option is probably to treat medically for now and consider mid LAD PCI in the near future (few months) depending on his clinical course and after making sure he recovers from current illnesses including GI bleed and renal failure.   EKG:  EKG is ordered today.  The EKG ordered today demonstrates sinus bradycardia, 57 bpm, first-degree AV block, lateral ST-T changes, largely consistent with prior tracing  Recent Labs: 10/02/2021: BUN 30; Creatinine, Ser 7.56; Hemoglobin 10.4; Platelets  156; Potassium 4.1; Sodium 140  Recent Lipid Panel    Component Value Date/Time   CHOL 131 05/12/2019 1134   CHOL 198 03/05/2019 1526   TRIG 161 (H) 05/12/2019 1134   HDL 31 (L) 05/12/2019 1134   HDL 33 (L) 03/05/2019 1526   CHOLHDL 4.2 05/12/2019 1134   VLDL 32 05/12/2019 1134   LDLCALC 68 05/12/2019 1134   LDLCALC 125 (H) 03/05/2019 1526    PHYSICAL EXAM:    VS:  BP 130/80 (BP Location: Right Arm, Patient Position: Sitting, Cuff Size: Normal)   Pulse (!) 57   Ht _0  (1.702 m)   Wt 161 lb (73 kg)   SpO2 98%   BMI 25.22 kg/m   BMI: Body mass index is 25.22 kg/m.  Physical Exam Vitals reviewed.  Constitutional:      Appearance: He is well-developed.  HENT:     Head: Normocephalic and atraumatic.  Eyes:     General:        Right eye: No discharge.        Left eye: No discharge.  Neck:     Vascular: No JVD.  Cardiovascular:     Rate and Rhythm: Normal rate and regular rhythm.     Pulses:          Carotid pulses are  on the right side with bruit and  on the left side with  bruit.    Heart sounds: S1 normal and S2 normal. Heart sounds not distant. No midsystolic click and no opening snap. Murmur heard.     Systolic murmur is present with a grade of 2/6 at the upper right sternal border.     No friction rub.  Pulmonary:     Effort: Pulmonary effort is normal. No respiratory distress.     Breath sounds: Normal breath sounds. No decreased breath sounds, wheezing or rales.  Chest:     Chest wall: No tenderness.  Abdominal:     General: There is no distension.     Palpations: Abdomen is soft.     Tenderness: There is no abdominal tenderness.  Musculoskeletal:     Cervical back: Normal range of motion.     Right lower leg: No edema.     Left lower leg: No edema.  Skin:    General: Skin is warm and dry.     Nails: There is no clubbing.  Neurological:     Mental Status: He is alert and oriented to person, place, and time.  Psychiatric:        Speech: Speech normal.         Behavior: Behavior normal.        Thought Content: Thought content normal.        Judgment: Judgment normal.     Wt Readings from Last 3 Encounters:  10/02/21 161 lb (73 kg)  08/28/21 163 lb 6.4 oz (74.1 kg)  04/05/20 175 lb (79.4 kg)     ASSESSMENT & PLAN:   CAD involving the native coronary arteries with other forms of angina and intermediate risk Lexiscan MPI: Currently without angina.  Given ongoing episodes of angina and in the setting of abnormal nuclear stress, particular along the apical region in the setting of prior LAD stenting, test we will plan to pursue LHC.  He will otherwise continue aggressive risk factor modification and aggressive secondary prevention including clopidogrel, atorvastatin, and Imdur.  Labile hypertension: Blood pressure is stable in the office today.  Continue current therapy including Imdur and midodrine with hemodialysis.  HLD: LDL 78.  He remains on atorvastatin and ezetimibe.  PAD: No symptoms of lifestyle limiting claudication.  Continue medical therapy.  This was not discussed in detail at today's visit.  Moderate bilateral carotid artery disease: Last imaging from 03/2020 demonstrated bilateral 40 to 59% ICA stenoses.  He remains on clopidogrel and atorvastatin.  Update carotid artery ultrasound.   Shared Decision Making/Informed Consent{  The risks [stroke (1 in 1000), death (1 in 1000), kidney failure [usually temporary] (1 in 500), bleeding (1 in 200), allergic reaction [possibly serious] (1 in 200)], benefits (diagnostic support and management of coronary artery disease) and alternatives of a cardiac catheterization were discussed in detail with Mr. Lubrano and he is willing to proceed.     Disposition: F/u with Dr. Fletcher Anon or an APP after testing.   Medication Adjustments/Labs and Tests Ordered: Current medicines are reviewed at length with the patient today.  Concerns regarding medicines are outlined above. Medication changes, Labs  and Tests ordered today are summarized above and listed in the Patient Instructions accessible in Encounters.   Signed, Christell Faith, PA-C 10/02/2021 4:57 PM     [place] [place], [place] [id number] [phone]

## 2021-10-02 ENCOUNTER — Encounter: Payer: Self-pay | Admitting: Physician Assistant

## 2021-10-02 ENCOUNTER — Other Ambulatory Visit
Admission: RE | Admit: 2021-10-02 | Discharge: 2021-10-02 | Disposition: A | Discharge status: Home / Self Care | Payer: Medicare Other | Source: Ambulatory Visit | Attending: Physician Assistant | Admitting: Physician Assistant

## 2021-10-02 ENCOUNTER — Ambulatory Visit (INDEPENDENT_AMBULATORY_CARE_PROVIDER_SITE_OTHER): Payer: Medicare Other | Admitting: Physician Assistant

## 2021-10-02 VITALS — BP 130/80 | HR 57 | Ht 67.0 in | Wt 161.0 lb

## 2021-10-02 DIAGNOSIS — E785 Hyperlipidemia, unspecified: Secondary | ICD-10-CM | POA: Diagnosis not present

## 2021-10-02 DIAGNOSIS — R0989 Other specified symptoms and signs involving the circulatory and respiratory systems: Secondary | ICD-10-CM | POA: Diagnosis not present

## 2021-10-02 DIAGNOSIS — Z01812 Encounter for preprocedural laboratory examination: Secondary | ICD-10-CM | POA: Diagnosis not present

## 2021-10-02 DIAGNOSIS — I739 Peripheral vascular disease, unspecified: Secondary | ICD-10-CM

## 2021-10-02 DIAGNOSIS — I6523 Occlusion and stenosis of bilateral carotid arteries: Secondary | ICD-10-CM

## 2021-10-02 DIAGNOSIS — I779 Disorder of arteries and arterioles, unspecified: Secondary | ICD-10-CM

## 2021-10-02 DIAGNOSIS — R9439 Abnormal result of other cardiovascular function study: Secondary | ICD-10-CM

## 2021-10-02 DIAGNOSIS — I25118 Atherosclerotic heart disease of native coronary artery with other forms of angina pectoris: Secondary | ICD-10-CM

## 2021-10-02 LAB — CBC
HCT: 32.9 % — ABNORMAL LOW (ref 39.0–52.0)
Hemoglobin: 10.4 g/dL — ABNORMAL LOW (ref 13.0–17.0)
MCH: 31.9 pg (ref 26.0–34.0)
MCHC: 31.6 g/dL (ref 30.0–36.0)
MCV: 100.9 fL — ABNORMAL HIGH (ref 80.0–100.0)
Platelets: 156 10*3/uL (ref 150–400)
RBC: 3.26 MIL/uL — ABNORMAL LOW (ref 4.22–5.81)
RDW: 13.6 % (ref 11.5–15.5)
WBC: 6.7 10*3/uL (ref 4.0–10.5)
nRBC: 0 % (ref 0.0–0.2)

## 2021-10-02 LAB — BASIC METABOLIC PANEL
Anion gap: 11 (ref 5–15)
BUN: 30 mg/dL — ABNORMAL HIGH (ref 8–23)
CO2: 31 mmol/L (ref 22–32)
Calcium: 9.3 mg/dL (ref 8.9–10.3)
Chloride: 98 mmol/L (ref 98–111)
Creatinine, Ser: 7.56 mg/dL — ABNORMAL HIGH (ref 0.61–1.24)
GFR, Estimated: 7 mL/min — ABNORMAL LOW (ref >60)
Glucose, Bld: 98 mg/dL (ref 70–99)
Potassium: 4.1 mmol/L (ref 3.5–5.1)
Sodium: 140 mmol/L (ref 135–145)

## 2021-10-02 NOTE — Patient Instructions (Addendum)
Medication Instructions:  - Your physician recommends that you continue on your current medications as directed. Please refer to the Current Medication list given to you today.  *If you need a refill on your cardiac medications before your next appointment, please call your pharmacy*   Lab Work: - Your physician recommends that you have lab work today:  [facility]/ [facility] at [facility] 1st desk on the right to check in (REGISTRATION)  Lab hours: Monday- Friday (7:30 am- 5:30 pm)   If you have labs (blood work) drawn today and your tests are completely normal, you will receive your results only by: MyChart Message (if you have MyChart) OR A paper copy in the mail If you have any lab test that is abnormal or we need to change your treatment, we will call you to review the results.   Testing/Procedures:  1) Carotid Ultrasound: - Your physician has requested that you have a carotid duplex. This test is an ultrasound of the carotid arteries in your neck. It looks at blood flow through these arteries that supply the brain with blood. Allow one hour for this exam. There are no restrictions or special instructions.   2) Cardiac Catheterization: - Your physician has requested that you have a cardiac catheterization. Cardiac catheterization is used to diagnose and/or treat various heart conditions. Doctors may recommend this procedure for a number of different reasons. The most common reason is to evaluate chest pain. Chest pain can be a symptom of coronary artery disease (CAD), and cardiac catheterization can show whether plaque is narrowing or blocking your heart's arteries. This procedure is also used to evaluate the valves, as well as measure the blood flow and oxygen levels in different parts of your heart.    You are scheduled for a Cardiac Catheterization on Tuesday, July 25 with Dr. Harrell Gave End.  1. Please arrive at the [facility] at 10:30 AM (This time  is one hour before your procedure to ensure your preparation). Free valet parking service is available.   Special note: Every effort is made to have your procedure done on time. Please understand that emergencies sometimes delay scheduled procedures.  2. Diet: Do not eat solid foods after midnight.  You may have clear liquids until 5 AM upon the day of the procedure.  3. Labs: as above  4. Medication instructions in preparation for your procedure:   Contrast Allergy: No   On the morning of your procedure, take Aspirin and Plavix/Clopidogrel and any morning medicines NOT listed above.  You may use sips of water.  5. Plan to go home the same day, you will only stay overnight if medically necessary. 6. You MUST have a responsible adult to drive you home. 7. An adult MUST be with you the first 24 hours after you arrive home. 8. Bring a current list of your medications, and the last time and date medication taken. 9. Bring ID and current insurance cards. 10.Please wear clothes that are easy to get on and off and wear slip-on shoes.  Thank you for allowing [place] to care for you!   -- [facility]    Follow-Up: At [facility], you and your health needs are our priority.  As part of our continuing mission to provide you with exceptional heart care, we have created designated Provider Care Teams.  These Care Teams include your primary Cardiologist (physician) and Advanced Practice Providers (APPs -  Physician Assistants and Nurse Practitioners)  who all work together to provide you with the care you need, when you need it.  We recommend signing up for the patient portal called "MyChart".  Sign up information is provided on this After Visit Summary.  MyChart is used to connect with patients for Virtual Visits (Telemedicine).  Patients are able to view lab/test results, encounter notes, upcoming appointments, etc.  Non-urgent messages can be sent to your provider as  well.   To learn more about what you can do with MyChart, go to [email or web address].    Your next appointment:   3 week(s)  The format for your next appointment:   In Person  Provider:   You may see [name], MD or one of the following Advanced Practice Providers on your designated Care Team:    Christell Faith, PA-C    Other Instructions  Coronary Angiogram A coronary angiogram is an X-ray procedure that is used to examine the arteries in the heart. Contrast dye is injected through a long, thin tube (catheter) into these arteries. Then X-rays are taken to show any blockage in these arteries. You may have this procedure if you: Are having chest pain, or other symptoms of angina, and you are at risk for heart disease. Have an abnormal stress test or test of your heart's electrical activity (electrocardiogram, or ECG). Have chest pain and heart failure. Are having irregular heart rhythms. A coronary angiogram or heart catheterization can show if you have valve disease or a disease of the aorta. This procedure can also be used to check the overall function of your heart muscle. Let your health care provider know about: Any allergies you have, including allergies to medicines or contrast dye. All medicines you are taking, including vitamins, herbs, eye drops, creams, and over-the-counter medicines. Any problems you or family members have had with anesthetic medicines. Any blood disorders you have. Any surgeries you have had. Any history of kidney problems or kidney failure. Any medical conditions you have. Whether you are pregnant or may be pregnant. Whether you are breastfeeding. What are the risks? Generally, this is a safe procedure. However, problems may occur, including: Infection. Allergic reaction to medicines or dyes that are used. Bleeding from the insertion site or other places. Damage to nearby structures, such as blood vessels, or damage to kidneys from contrast  dye. Irregular heart rhythms. Stroke (rare). Heart attack (rare). What happens before the procedure? Staying hydrated Follow instructions from your health care provider about hydration, which may include: Up to 2 hours before the procedure - you may continue to drink clear liquids, such as water, clear fruit juice, black coffee, and plain tea.  Eating and drinking restrictions Follow instructions from your health care provider about eating and drinking, which may include: 8 hours before the procedure - stop eating heavy meals or foods, such as meat, fried foods, or fatty foods. 6 hours before the procedure - stop eating light meals or foods, such as toast or cereal. 6 hours before the procedure - stop drinking milk or drinks that contain milk. 2 hours before the procedure - stop drinking clear liquids. Medicines Ask your health care provider about: Changing or stopping your regular medicines. This is especially important if you are taking diabetes medicines or blood thinners. Taking medicines such as aspirin and ibuprofen. These medicines can thin your blood. Do not take these medicines unless your health care provider tells you to take them. Aspirin may be recommended before coronary angiograms even if you do not normally  take it. Taking over-the-counter medicines, vitamins, herbs, and supplements. General instructions Do not use any products that contain nicotine or tobacco for at least 4 weeks before the procedure. These products include cigarettes, e-cigarettes, and chewing tobacco. If you need help quitting, ask your health care provider. You may have an exam or testing. Plan to have someone take you home from the hospital or clinic. If you will be going home right after the procedure, plan to have someone with you for 24 hours. Ask your health care provider: How your insertion site will be marked. What steps will be taken to help prevent infection. These may include: Removing hair  at the insertion site. Washing skin with a germ-killing soap. Taking antibiotic medicine. What happens during the procedure?  You will lie on your back on an X-ray table. An IV will be inserted into one of your veins. Electrodes will be placed on your chest. You will be given one or more of the following: A medicine to help you relax (sedative). A medicine to numb the catheter insertion area (local anesthetic). You will be connected to a continuous ECG monitor. The catheter will be inserted into an artery in one of these areas: Your groin area in your upper thigh. Your wrist. The fold of your arm, near your elbow. An X-ray procedure (fluoroscopy) will be used to help guide the catheter to the opening of the blood vessel to be used. A dye will be injected into the catheter and X-rays will be taken. The dye will help to show any narrowing or blockages in the heart arteries. Tell your health care provider if you have chest pain or trouble breathing. If blockages are found, another procedure may be done to open the artery. The catheter will be removed after the fluoroscopy is complete. A bandage (dressing) will be placed over the insertion site. Pressure will be applied to stop bleeding. The IV will be removed. The procedure may vary among health care providers and hospitals. What happens after the procedure? Your blood pressure, heart rate, breathing rate, and blood oxygen level will be monitored until you leave the hospital or clinic. You will need to lie still for a few hours, or for as long as told by your health care provider. If the procedure is done through the groin, you will be told not to bend or cross your legs. The insertion site and the pulse in your foot or wrist will be checked often. More blood tests, X-rays, and an ECG may be done. Do not drive for 24 hours if you were given a sedative during your procedure. Summary A coronary angiogram is an X-ray procedure that is used  to examine the arteries in the heart. Contrast dye is injected through a long, thin tube (catheter) into each artery. Tell your health care provider about any allergies you have, including allergies to contrast dye. After the procedure, you will need to lie still for a few hours and drink plenty of fluids. This information is not intended to replace advice given to you by your health care provider. Make sure you discuss any questions you have with your health care provider. Document Revised: 09/24/2018 Document Reviewed: 09/24/2018 Elsevier Patient Education  [place]

## 2021-10-03 ENCOUNTER — Telehealth: Payer: Self-pay

## 2021-10-03 DIAGNOSIS — N186 End stage renal disease: Secondary | ICD-10-CM | POA: Diagnosis not present

## 2021-10-03 DIAGNOSIS — D631 Anemia in chronic kidney disease: Secondary | ICD-10-CM | POA: Diagnosis not present

## 2021-10-03 DIAGNOSIS — N2581 Secondary hyperparathyroidism of renal origin: Secondary | ICD-10-CM | POA: Diagnosis not present

## 2021-10-03 DIAGNOSIS — Z992 Dependence on renal dialysis: Secondary | ICD-10-CM | POA: Diagnosis not present

## 2021-10-03 DIAGNOSIS — D689 Coagulation defect, unspecified: Secondary | ICD-10-CM | POA: Diagnosis not present

## 2021-10-03 NOTE — Telephone Encounter (Signed)
-----   Message from Rise Mu, PA-C sent at 10/02/2021  5:33 PM EDT ----- Roger [name] labs are stable with known ESRD on hemodialysis with potassium at goal and known, stable mild anemia.

## 2021-10-03 NOTE — Telephone Encounter (Signed)
Left message for pt to call back  °

## 2021-10-04 ENCOUNTER — Other Ambulatory Visit: Payer: Self-pay | Admitting: Physician Assistant

## 2021-10-04 NOTE — Telephone Encounter (Signed)
Left voicemail message to call back for review of results.  

## 2021-10-04 NOTE — Telephone Encounter (Signed)
Reviewed results with patients daughter per release form. She verbalized understanding with no further questions at this time.

## 2021-10-04 NOTE — Progress Notes (Signed)
Orders for cath have been signed and held.

## 2021-10-05 DIAGNOSIS — N2581 Secondary hyperparathyroidism of renal origin: Secondary | ICD-10-CM | POA: Diagnosis not present

## 2021-10-05 DIAGNOSIS — N186 End stage renal disease: Secondary | ICD-10-CM | POA: Diagnosis not present

## 2021-10-05 DIAGNOSIS — Z992 Dependence on renal dialysis: Secondary | ICD-10-CM | POA: Diagnosis not present

## 2021-10-05 DIAGNOSIS — D689 Coagulation defect, unspecified: Secondary | ICD-10-CM | POA: Diagnosis not present

## 2021-10-05 DIAGNOSIS — D631 Anemia in chronic kidney disease: Secondary | ICD-10-CM | POA: Diagnosis not present

## 2021-10-08 DIAGNOSIS — D689 Coagulation defect, unspecified: Secondary | ICD-10-CM | POA: Diagnosis not present

## 2021-10-08 DIAGNOSIS — D631 Anemia in chronic kidney disease: Secondary | ICD-10-CM | POA: Diagnosis not present

## 2021-10-08 DIAGNOSIS — N186 End stage renal disease: Secondary | ICD-10-CM | POA: Diagnosis not present

## 2021-10-08 DIAGNOSIS — Z992 Dependence on renal dialysis: Secondary | ICD-10-CM | POA: Diagnosis not present

## 2021-10-08 DIAGNOSIS — N2581 Secondary hyperparathyroidism of renal origin: Secondary | ICD-10-CM | POA: Diagnosis not present

## 2021-10-09 ENCOUNTER — Other Ambulatory Visit: Payer: Self-pay

## 2021-10-09 ENCOUNTER — Encounter: Payer: Self-pay | Admitting: Internal Medicine

## 2021-10-09 ENCOUNTER — Ambulatory Visit
Admission: RE | Admit: 2021-10-09 | Discharge: 2021-10-09 | Disposition: A | Discharge status: Home / Self Care | Payer: Medicare Other | Source: Ambulatory Visit | Attending: Internal Medicine | Admitting: Internal Medicine

## 2021-10-09 ENCOUNTER — Encounter
Admission: RE | Disposition: A | Discharge status: Home / Self Care | Payer: Self-pay | Source: Ambulatory Visit | Attending: Internal Medicine

## 2021-10-09 DIAGNOSIS — Z992 Dependence on renal dialysis: Secondary | ICD-10-CM | POA: Insufficient documentation

## 2021-10-09 DIAGNOSIS — I2584 Coronary atherosclerosis due to calcified coronary lesion: Secondary | ICD-10-CM | POA: Diagnosis not present

## 2021-10-09 DIAGNOSIS — E785 Hyperlipidemia, unspecified: Secondary | ICD-10-CM | POA: Diagnosis not present

## 2021-10-09 DIAGNOSIS — I2582 Chronic total occlusion of coronary artery: Secondary | ICD-10-CM | POA: Insufficient documentation

## 2021-10-09 DIAGNOSIS — Z87891 Personal history of nicotine dependence: Secondary | ICD-10-CM | POA: Insufficient documentation

## 2021-10-09 DIAGNOSIS — I6523 Occlusion and stenosis of bilateral carotid arteries: Secondary | ICD-10-CM | POA: Insufficient documentation

## 2021-10-09 DIAGNOSIS — Z7902 Long term (current) use of antithrombotics/antiplatelets: Secondary | ICD-10-CM | POA: Diagnosis not present

## 2021-10-09 DIAGNOSIS — R9439 Abnormal result of other cardiovascular function study: Secondary | ICD-10-CM | POA: Diagnosis present

## 2021-10-09 DIAGNOSIS — I70201 Unspecified atherosclerosis of native arteries of extremities, right leg: Secondary | ICD-10-CM | POA: Diagnosis not present

## 2021-10-09 DIAGNOSIS — I480 Paroxysmal atrial fibrillation: Secondary | ICD-10-CM | POA: Diagnosis not present

## 2021-10-09 DIAGNOSIS — I252 Old myocardial infarction: Secondary | ICD-10-CM | POA: Diagnosis not present

## 2021-10-09 DIAGNOSIS — I132 Hypertensive heart and chronic kidney disease with heart failure and with stage 5 chronic kidney disease, or end stage renal disease: Secondary | ICD-10-CM | POA: Insufficient documentation

## 2021-10-09 DIAGNOSIS — I25110 Atherosclerotic heart disease of native coronary artery with unstable angina pectoris: Secondary | ICD-10-CM | POA: Diagnosis not present

## 2021-10-09 DIAGNOSIS — N186 End stage renal disease: Secondary | ICD-10-CM | POA: Insufficient documentation

## 2021-10-09 DIAGNOSIS — I200 Unstable angina: Secondary | ICD-10-CM

## 2021-10-09 DIAGNOSIS — Z955 Presence of coronary angioplasty implant and graft: Secondary | ICD-10-CM | POA: Diagnosis not present

## 2021-10-09 DIAGNOSIS — Z79899 Other long term (current) drug therapy: Secondary | ICD-10-CM | POA: Insufficient documentation

## 2021-10-09 DIAGNOSIS — I5032 Chronic diastolic (congestive) heart failure: Secondary | ICD-10-CM | POA: Insufficient documentation

## 2021-10-09 HISTORY — PX: LEFT HEART CATH AND CORONARY ANGIOGRAPHY: CATH118249

## 2021-10-09 SURGERY — LEFT HEART CATH AND CORONARY ANGIOGRAPHY
Anesthesia: Moderate Sedation | Laterality: Left

## 2021-10-09 MED ORDER — SODIUM CHLORIDE 0.9 % IV SOLN: 250.0000 mL | INTRAVENOUS | Status: DC | PRN

## 2021-10-09 MED ORDER — METOPROLOL TARTRATE 25 MG PO TABS: 12.5000 mg | ORAL_TABLET | Freq: Two times a day (BID) | ORAL | 11 refills | Status: AC

## 2021-10-09 MED ORDER — HEPARIN (PORCINE) IN NACL 1000-0.9 UT/500ML-% IV SOLN
INTRAVENOUS | Status: DC | PRN
  Administered 2021-10-09 (×2): 500 mL

## 2021-10-09 MED ORDER — SODIUM CHLORIDE 0.9 % IV SOLN: INTRAVENOUS | Status: DC

## 2021-10-09 MED ORDER — SODIUM CHLORIDE 0.9% FLUSH: 3.0000 mL | Freq: Two times a day (BID) | INTRAVENOUS | Status: DC

## 2021-10-09 MED ORDER — SODIUM CHLORIDE 0.9% FLUSH: 3.0000 mL | INTRAVENOUS | Status: DC | PRN

## 2021-10-09 MED ORDER — FENTANYL CITRATE (PF) 100 MCG/2ML IJ SOLN
INTRAMUSCULAR | Status: AC
  Filled 2021-10-09: qty 2

## 2021-10-09 MED ORDER — LIDOCAINE HCL 1 % IJ SOLN
INTRAMUSCULAR | Status: AC
  Filled 2021-10-09: qty 20

## 2021-10-09 MED ORDER — HEPARIN (PORCINE) IN NACL 1000-0.9 UT/500ML-% IV SOLN
INTRAVENOUS | Status: AC
  Filled 2021-10-09: qty 1000

## 2021-10-09 MED ORDER — ASPIRIN 81 MG PO CHEW: 81.0000 mg | CHEWABLE_TABLET | ORAL | Status: AC

## 2021-10-09 MED ORDER — VERAPAMIL HCL 2.5 MG/ML IV SOLN
INTRAVENOUS | Status: AC
  Filled 2021-10-09: qty 2

## 2021-10-09 MED ORDER — MIDAZOLAM HCL 2 MG/2ML IJ SOLN
INTRAMUSCULAR | Status: AC
  Filled 2021-10-09: qty 2

## 2021-10-09 MED ORDER — MIDAZOLAM HCL 2 MG/2ML IJ SOLN
INTRAMUSCULAR | Status: DC | PRN
  Administered 2021-10-09 (×2): 1 mg via INTRAVENOUS

## 2021-10-09 MED ORDER — HEPARIN SODIUM (PORCINE) 1000 UNIT/ML IJ SOLN
INTRAMUSCULAR | Status: AC
  Filled 2021-10-09: qty 10

## 2021-10-09 MED ORDER — IOHEXOL 300 MG/ML  SOLN
INTRAMUSCULAR | Status: DC | PRN
  Administered 2021-10-09: 30 mL

## 2021-10-09 MED ORDER — FENTANYL CITRATE (PF) 100 MCG/2ML IJ SOLN
INTRAMUSCULAR | Status: DC | PRN
  Administered 2021-10-09 (×3): 25 ug via INTRAVENOUS

## 2021-10-09 MED ORDER — ASPIRIN 81 MG PO CHEW
CHEWABLE_TABLET | ORAL | Status: AC
  Administered 2021-10-09: 81 mg via ORAL
  Filled 2021-10-09: qty 1

## 2021-10-09 MED ORDER — LIDOCAINE HCL (PF) 1 % IJ SOLN
INTRAMUSCULAR | Status: DC | PRN
  Administered 2021-10-09: 20 mL

## 2021-10-09 SURGICAL SUPPLY — 14 items
CANNULA 5F STIFF (CANNULA) ×1 IMPLANT
CATH INFINITI 5FR MULTPACK ANG (CATHETERS) ×1 IMPLANT
DRAPE BRACHIAL (DRAPES) IMPLANT
GLIDESHEATH SLEND SS 6F .021 (SHEATH) IMPLANT
GUIDEWIRE INQWIRE 1.5J.035X260 (WIRE) IMPLANT
INQWIRE 1.5J .035X260CM (WIRE)
PACK CARDIAC CATH (CUSTOM PROCEDURE TRAY) ×3 IMPLANT
PROTECTION STATION PRESSURIZED (MISCELLANEOUS) ×2
SET ATX SIMPLICITY (MISCELLANEOUS) ×1 IMPLANT
SHEATH AVANTI 5FR X 11CM (SHEATH) ×1 IMPLANT
STATION PROTECTION PRESSURIZED (MISCELLANEOUS) IMPLANT
WIRE EMERALD 3MM-J .035X260CM (WIRE) ×1 IMPLANT
WIRE GUIDERIGHT .035X150 (WIRE) ×1 IMPLANT
WIRE HITORQ VERSACORE ST 145CM (WIRE) ×1 IMPLANT

## 2021-10-09 NOTE — Interval H&P Note (Signed)
History and Physical Interval Note:  10/09/2021 12:04 PM  Roger [name]  has presented today for surgery, with the diagnosis of unstable angina  Abnormal stress test.  The various methods of treatment have been discussed with the patient and family. After consideration of risks, benefits and other options for treatment, the patient has consented to  Procedure(s): LEFT HEART CATH AND CORONARY ANGIOGRAPHY (Left) as a surgical intervention.  The patient's history has been reviewed, patient examined, no change in status, stable for surgery.  I have reviewed the patient's chart and labs.  Questions were answered to the patient's satisfaction.    Cath Lab Visit (complete for each Cath Lab visit)  Clinical Evaluation Leading to the Procedure:   ACS: No.  Non-ACS:    Anginal Classification: CCS IV  Anti-ischemic medical therapy: Minimal Therapy (1 class of medications)  Non-Invasive Test Results: Intermediate-risk stress test findings: cardiac mortality 1-3%/year  Prior CABG: No previous CABG  Roger [name]

## 2021-10-09 NOTE — Progress Notes (Signed)
Pt awake and drinking fluids

## 2021-10-09 NOTE — Progress Notes (Signed)
Pt sitting up and eating and drinking

## 2021-10-10 ENCOUNTER — Encounter: Payer: Self-pay | Admitting: Internal Medicine

## 2021-10-10 DIAGNOSIS — D631 Anemia in chronic kidney disease: Secondary | ICD-10-CM | POA: Diagnosis not present

## 2021-10-10 DIAGNOSIS — N186 End stage renal disease: Secondary | ICD-10-CM | POA: Diagnosis not present

## 2021-10-10 DIAGNOSIS — D689 Coagulation defect, unspecified: Secondary | ICD-10-CM | POA: Diagnosis not present

## 2021-10-10 DIAGNOSIS — Z992 Dependence on renal dialysis: Secondary | ICD-10-CM | POA: Diagnosis not present

## 2021-10-10 DIAGNOSIS — N2581 Secondary hyperparathyroidism of renal origin: Secondary | ICD-10-CM | POA: Diagnosis not present

## 2021-10-11 ENCOUNTER — Ambulatory Visit (HOSPITAL_COMMUNITY)
Admission: RE | Admit: 2021-10-11 | Discharge: 2021-10-11 | Disposition: A | Discharge status: Home / Self Care | Payer: Medicare Other | Source: Ambulatory Visit | Attending: Cardiology | Admitting: Cardiology

## 2021-10-11 ENCOUNTER — Other Ambulatory Visit: Payer: Self-pay | Admitting: *Deleted

## 2021-10-11 DIAGNOSIS — I6523 Occlusion and stenosis of bilateral carotid arteries: Secondary | ICD-10-CM | POA: Diagnosis not present

## 2021-10-11 DIAGNOSIS — I779 Disorder of arteries and arterioles, unspecified: Secondary | ICD-10-CM | POA: Diagnosis not present

## 2021-10-12 DIAGNOSIS — D689 Coagulation defect, unspecified: Secondary | ICD-10-CM | POA: Diagnosis not present

## 2021-10-12 DIAGNOSIS — Z992 Dependence on renal dialysis: Secondary | ICD-10-CM | POA: Diagnosis not present

## 2021-10-12 DIAGNOSIS — D631 Anemia in chronic kidney disease: Secondary | ICD-10-CM | POA: Diagnosis not present

## 2021-10-12 DIAGNOSIS — N2581 Secondary hyperparathyroidism of renal origin: Secondary | ICD-10-CM | POA: Diagnosis not present

## 2021-10-12 DIAGNOSIS — N186 End stage renal disease: Secondary | ICD-10-CM | POA: Diagnosis not present

## 2021-10-15 DIAGNOSIS — N186 End stage renal disease: Secondary | ICD-10-CM | POA: Diagnosis not present

## 2021-10-15 DIAGNOSIS — N2581 Secondary hyperparathyroidism of renal origin: Secondary | ICD-10-CM | POA: Diagnosis not present

## 2021-10-15 DIAGNOSIS — D631 Anemia in chronic kidney disease: Secondary | ICD-10-CM | POA: Diagnosis not present

## 2021-10-15 DIAGNOSIS — I129 Hypertensive chronic kidney disease with stage 1 through stage 4 chronic kidney disease, or unspecified chronic kidney disease: Secondary | ICD-10-CM | POA: Diagnosis not present

## 2021-10-15 DIAGNOSIS — Z992 Dependence on renal dialysis: Secondary | ICD-10-CM | POA: Diagnosis not present

## 2021-10-15 DIAGNOSIS — D689 Coagulation defect, unspecified: Secondary | ICD-10-CM | POA: Diagnosis not present

## 2021-10-17 DIAGNOSIS — Z992 Dependence on renal dialysis: Secondary | ICD-10-CM | POA: Diagnosis not present

## 2021-10-17 DIAGNOSIS — D631 Anemia in chronic kidney disease: Secondary | ICD-10-CM | POA: Diagnosis not present

## 2021-10-17 DIAGNOSIS — D689 Coagulation defect, unspecified: Secondary | ICD-10-CM | POA: Diagnosis not present

## 2021-10-17 DIAGNOSIS — N2581 Secondary hyperparathyroidism of renal origin: Secondary | ICD-10-CM | POA: Diagnosis not present

## 2021-10-17 DIAGNOSIS — N186 End stage renal disease: Secondary | ICD-10-CM | POA: Diagnosis not present

## 2021-10-19 DIAGNOSIS — N186 End stage renal disease: Secondary | ICD-10-CM | POA: Diagnosis not present

## 2021-10-19 DIAGNOSIS — D689 Coagulation defect, unspecified: Secondary | ICD-10-CM | POA: Diagnosis not present

## 2021-10-19 DIAGNOSIS — N2581 Secondary hyperparathyroidism of renal origin: Secondary | ICD-10-CM | POA: Diagnosis not present

## 2021-10-19 DIAGNOSIS — D631 Anemia in chronic kidney disease: Secondary | ICD-10-CM | POA: Diagnosis not present

## 2021-10-19 DIAGNOSIS — Z992 Dependence on renal dialysis: Secondary | ICD-10-CM | POA: Diagnosis not present

## 2021-10-22 DIAGNOSIS — D631 Anemia in chronic kidney disease: Secondary | ICD-10-CM | POA: Diagnosis not present

## 2021-10-22 DIAGNOSIS — N186 End stage renal disease: Secondary | ICD-10-CM | POA: Diagnosis not present

## 2021-10-22 DIAGNOSIS — D689 Coagulation defect, unspecified: Secondary | ICD-10-CM | POA: Diagnosis not present

## 2021-10-22 DIAGNOSIS — N2581 Secondary hyperparathyroidism of renal origin: Secondary | ICD-10-CM | POA: Diagnosis not present

## 2021-10-22 DIAGNOSIS — Z992 Dependence on renal dialysis: Secondary | ICD-10-CM | POA: Diagnosis not present

## 2021-10-23 ENCOUNTER — Other Ambulatory Visit
Admission: RE | Admit: 2021-10-23 | Discharge: 2021-10-23 | Disposition: A | Discharge status: Home / Self Care | Payer: Medicare Other | Source: Ambulatory Visit | Attending: Medical | Admitting: Medical

## 2021-10-23 ENCOUNTER — Encounter: Payer: Self-pay | Admitting: Medical

## 2021-10-23 ENCOUNTER — Ambulatory Visit (INDEPENDENT_AMBULATORY_CARE_PROVIDER_SITE_OTHER): Payer: Medicare Other | Admitting: Medical

## 2021-10-23 VITALS — BP 110/60 | HR 55 | Ht 67.0 in | Wt 161.0 lb

## 2021-10-23 DIAGNOSIS — I779 Disorder of arteries and arterioles, unspecified: Secondary | ICD-10-CM | POA: Diagnosis not present

## 2021-10-23 DIAGNOSIS — E785 Hyperlipidemia, unspecified: Secondary | ICD-10-CM

## 2021-10-23 DIAGNOSIS — R011 Cardiac murmur, unspecified: Secondary | ICD-10-CM

## 2021-10-23 DIAGNOSIS — I739 Peripheral vascular disease, unspecified: Secondary | ICD-10-CM

## 2021-10-23 DIAGNOSIS — I25118 Atherosclerotic heart disease of native coronary artery with other forms of angina pectoris: Secondary | ICD-10-CM

## 2021-10-23 DIAGNOSIS — K219 Gastro-esophageal reflux disease without esophagitis: Secondary | ICD-10-CM | POA: Diagnosis not present

## 2021-10-23 LAB — LIPID PANEL
Cholesterol: 135 mg/dL (ref 0–200)
HDL: 31 mg/dL — ABNORMAL LOW (ref >40)
LDL Cholesterol: 64 mg/dL (ref 0–99)
Total CHOL/HDL Ratio: 4.4 RATIO
Triglycerides: 199 mg/dL — ABNORMAL HIGH (ref <150)
VLDL: 40 mg/dL (ref 0–40)

## 2021-10-23 MED ORDER — PANTOPRAZOLE SODIUM 40 MG PO TBEC: 40.0000 mg | DELAYED_RELEASE_TABLET | Freq: Two times a day (BID) | ORAL | 3 refills | Status: DC

## 2021-10-23 NOTE — Patient Instructions (Signed)
Medication Instructions:   Your physician recommends that you continue on your current medications as directed. Please refer to the Current Medication list given to you today.  *If you need a refill on your cardiac medications before your next appointment, please call your pharmacy*   Lab Work:  LIPID PANEL TODAY -  Please go to the [place] Entrance at [place] in at the Registration Desk: 1st desk to the right, past the screening table   If you have labs (blood work) drawn today and your tests are completely normal, you will receive your results only by: MyChart Message (if you have MyChart) OR A paper copy in the mail If you have any lab test that is abnormal or we need to change your treatment, we will call you to review the results.   Testing/Procedures: Your physician has requested that you have an echocardiogram. Echocardiography is a painless test that uses sound waves to create images of your heart. It provides your doctor with information about the size and shape of your heart and how well your heart's chambers and valves are working. This procedure takes approximately one hour. There are no restrictions for this procedure.    Follow-Up: At [facility], you and your health needs are our priority.  As part of our continuing mission to provide you with exceptional heart care, we have created designated Provider Care Teams.  These Care Teams include your primary Cardiologist (physician) and Advanced Practice Providers (APPs -  Physician Assistants and Nurse Practitioners) who all work together to provide you with the care you need, when you need it.  We recommend signing up for the patient portal called "MyChart".  Sign up information is provided on this After Visit Summary.  MyChart is used to connect with patients for Virtual Visits (Telemedicine).  Patients are able to view lab/test results, encounter notes, upcoming appointments, etc.  Non-urgent messages can be  sent to your provider as well.   To learn more about what you can do with MyChart, go to [email or web address].    Your next appointment:   3 month(s)  The format for your next appointment:   In Person  Provider:   You may see [name], MD or one of the following Advanced Practice Providers on your designated Care Team:   [name], NP Christell Faith, PA-C Cadence Kathlen Mody, [place]    Other Instructions N/A  Important Information About [place]

## 2021-10-23 NOTE — Progress Notes (Signed)
Cardiology Office Note:    Date:  10/23/2021   ID:  Roger [name], DOB [birth year], MRN [id number]  PCP:  Lujean Amel, MD  [facility] Cardiologist:  [name], MD  [facility] Electrophysiologist:  None   Referring MD: Lujean Amel, MD   Chief Complaint: 4-6 week follow-up  History of Present Illness:    Roger [name] is a 81 y.o. male with a hx of CAD status post PCI/DES to the LAD in 2018, PAF not on [place] secondary to history of GI bleeding, HFpEF, ESRD on HD (TTS) since 10/2016, PAD, carotid artery disease, anemia of chronic disease, prostate cancer, HTN, HLD, and tobacco use who presents for follow-up of Lexiscan MPI.   He was admitted to the hospital in 10/2016 with NSTEMI in the setting of GI bleed with anemia.  Diagnostic LHC showed CTO of the RCA with left-to-right collaterals as well as a CTO of the LCx which was a small vessel.  The mid LAD had a 70% stenosis.  There were no good targets for bypass surgery, and in this setting, he subsequently underwent staged PCI/DES to the LAD in 11/2016.  Echo at that time showed normal LV systolic function.  He later developed recurrent GI bleeding on DAPT and has been on monotherapy with Plavix since.  He was admitted to the hospital in 07/2017 with chest pain, ruling out.  Echo showed normal LV systolic function.  Initially, there was concern for possible vegetations on his aortic and mitral valves, however upon further evaluation it was felt the valves were heavily calcified and not significantly changed from prior echoes.  Stress testing in 07/2017 and again in 09/2018, performed in the setting of chest pain, was nonischemic.  The chest pain as noted in 09/2018 was during dialysis.  He was seen in the office in 02/2019 and was doing well, without chest pain.  His midodrine had been increased to 10 mg twice daily on dialysis days.  He was also having to hold metoprolol and Imdur on the morning of dialysis, and with these changes he had not  been experiencing significant hypotension or lightheadedness with HD.  He did continue to note significant fatigue following HD.  Carotid artery ultrasound in 02/2019 showed stable bilateral ICA 40 to 59% stenosis and antegrade flow in the bilateral vertebral arteries.  He was referred to the [facility] kidney transplant program and deemed to not be a candidate for transplant due to age, comorbid conditions, and lack of a living donor.  He was seen in the office on 09/24/2019 and noted an increase in chest pain and SOB occurring with hemodialysis with associated episodes of hypotension. He was intermittently requiring supplemental oxygen with his dialysis and continued to have abdominal spasms during hemodialysis. He underwent echo on 10/29/2019, that showed an EF of 60-65%, no RWMA, Gr1DD, mild LVH, normal RVSF and ventricular cavity size, moderate mitral regurgitation, and mild aortic valve insufficiency.    he underwent Lexiscan MPI on 09/25/2020 for SOB and CP, which was abnormal.  There was a small in size, moderate in severity, partially reversible defect involving the basal and mid inferior segments consistent with scar and peri-infarct ischemia.  There was also a moderate in size, small to moderate in severity, partially reversible defect involving the mid anterior, mid anterolateral, apical anterior, and apical lateral segments that was most apparent on attenuation corrected images.  This was felt to most likely represent artifact, though scar and peri-infarct ischemia could not be excluded.  When compared to study from 09/2018, the mid/apical anterior/anterolateral defect was new.  LVEF estimated at 61%.  There was extensive coronary artery calcification and aortic atherosclerosis.  Overall, this was graded as an intermediate risk study.  Last seen 10/02/21 and reported intermittent chest discomfort. He was set up for [facility] which showed severe 2V CAD with CTO of the pLCx and RCA, as seen on prior catheterizations  in 2018, moderate mid LMCA and m LAD, mod-severe mid ramus intermedius disease with 60-70% stenosis, widely patent mLAD stent. Medical therapy was recommended, and metoprolol was added for non dialysis days.  Today, the heart cath was briefly discussed. He denies shortness of breath. He has occasional chest pain mainly at night. Chest pain can occur with belching. He has Protonix on his list, but is unsure if he is taking it.  No lower leg edema, orthopnea, pnd. Right groin cath site is stable. BP is good today, he is on midodrine on HD days.      Past Medical History:  Diagnosis Date   Anemia    Anginal pain ([place])    Arthritis    "all over; bad in the legs" (12/04/2016)   CAD (coronary artery disease)    a. NSTEMI in setting of anemia; b. 10/2016 MV: reversible ant, apical, inflat defect; c. 10/2016 Cath: LM nl, LAD 40p, 74m D1 70, D2 80, RI min irregs, LCX 100ost, RCA 100p, 1072m, RPDA fills via collats from LAD, RPAV small-->Initial conservative Rx in setting of GIB w/ plan for PCI LAD if H/H stable on ASA/Plavix;  d. 12/04/16 s/p PTCA and DES to mLAD; e. 09/2018 MV: EF 54%, no ischemia.   Carotid disease, bilateral ([place]   a. 04/2017 U/S: RICA 4046-96RCCA >5>29LICA 4052-84  Depression    situational    Diastolic dysfunction    a. 10/2016 Echo: EF 55-60%, no rwma, Gr2 DD, triv MR, mildly dil LA; b. 07/2017 Echo: EF 60-65%, Gr1 DD. No rwma. AoV leaflet thickening (not felt to be SBE upon further review). Mild MR. Mildly dil LA.   [facility]   "[place]; [address]; [place]TS usually; [place] [facility] [place] staying w/daughter" (12/04/2016)   GERD (gastroesophageal reflux disease)    GIB (gastrointestinal bleeding) 10/2011   a. 10/2016 3u PRBCs - EGD/Colonoscopy: angiodysplasia w/ diverticulosis. No apparent bleeding. Polypecotmy->tubular adenomas.   Gout    Headache    no migraines for years   Heart murmur    High cholesterol    History of blood transfusion  10/18/2016   "related to anemia" (12/04/2016)   History of kidney stones    years ago   Hypertension    Iron deficiency anemia    Myocardial infarction ([facility]/05/2016   Old MI (myocardial infarction)    "found evidence of this on 10/18/2016"   PAF (paroxysmal atrial fibrillation) ([place]   Peripheral vascular disease (HCC)    BLE   Prostate cancer ([facility]   a. s/p seed implantation.   Tobacco abuse     Past Surgical History:  Procedure Laterality Date   AV FISTULA PLACEMENT Right 03/28/2017   Procedure: ARTERIOVENOUS (AV) BRACHIOCEPHALIC FISTULA CREATION;  Surgeon: DiAngelia MouldMD;  [place] [facility]: Vascular;  Laterality: Right;   CARDIAC CATHETERIZATION  10/2016   CHOLECYSTECTOMY N/A 11/03/2019   Procedure: LAPAROSCOPIC CHOLECYSTECTOMY;  Surgeon: CoClovis RileyMD;  [place] [facility] Service: General;  Laterality: N/A;  COLONOSCOPY WITH PROPOFOL N/A 10/21/2016   Procedure: COLONOSCOPY WITH PROPOFOL;  Surgeon: Gatha Mayer, MD;  Location[facility] ENDOSCOPY;  Service: Endoscopy;  Laterality: N/A;   CORONARY ANGIOPLASTY WITH STENT PLACEMENT  12/04/2016   "1 stent"   CORONARY STENT INTERVENTION N/A 12/04/2016   Procedure: CORONARY STENT INTERVENTION;  Surgeon: Wellington Hampshire, MD;  Location: [place] CV LAB;  Service: Cardiovascular;  Laterality: N/A;   ESOPHAGOGASTRODUODENOSCOPY N/A 10/21/2016   Procedure: ESOPHAGOGASTRODUODENOSCOPY (EGD);  Surgeon: Gatha Mayer, MD;  Location: [facility] ENDOSCOPY;  Service: Endoscopy;  Laterality: N/A;   INSERTION OF DIALYSIS CATHETER Right 10/22/2016   Procedure: INSERTION OF TUNNELED DIALYSIS CATHETER;  Surgeon: Elam Dutch, MD;  Location: [place];  Service: Vascular;  Laterality: Right;   INTRAOPERATIVE CHOLANGIOGRAM N/A 11/03/2019   Procedure: ATTEMPTED INTRAOPERATIVE CHOLANGIOGRAM;  Surgeon: Clovis Riley, MD;  Location: [place];  Service: General;  Laterality: N/A;   LEFT HEART CATH AND CORONARY ANGIOGRAPHY N/A 10/23/2016    Procedure: LEFT HEART CATH AND CORONARY ANGIOGRAPHY;  Surgeon: Wellington Hampshire, MD;  Location: [place] CV LAB;  Service: Cardiovascular;  Laterality: N/A;   LEFT HEART CATH AND CORONARY ANGIOGRAPHY Left 10/09/2021   Procedure: LEFT HEART CATH AND CORONARY ANGIOGRAPHY;  Surgeon: Nelva Bush, MD;  Location: [place] CV LAB;  Service: Cardiovascular;  Laterality: Left;   LIGATION OF ARTERIOVENOUS  FISTULA Right 05/12/2017   Procedure: BANDING OF RIGHT UPPER ARM ARTERIOVENOUS  FISTULA USING 6MM X 10CM GORE TEX VASCULAR GRAFT;  Surgeon: Angelia Mould, MD;  Location: [place];  Service: Vascular;  Laterality: Right;   LIGATION OF ARTERIOVENOUS  FISTULA Right 07/03/2017   Procedure: LIGATION OF ARTERIOVENOUS  FISTULA RIGHT ARM;  Surgeon: Angelia Mould, MD;  Location: [facility] OR;  Service: Vascular;  Laterality: Right;   TONSILLECTOMY      Current Medications: Current Meds  Medication Sig   acetaminophen (TYLENOL) 325 MG tablet Take 2 tablets (650 mg total) by mouth every 6 (six) hours as needed for mild pain.   allopurinol (ZYLOPRIM) 100 MG tablet Take 1 tablet (100 mg total) by mouth daily.   atorvastatin (LIPITOR) 80 MG tablet TAKE 1 TABLET(80 MG) BY MOUTH DAILY AT 6 PM   b complex vitamins tablet Take 1 tablet by mouth at bedtime.    calcitRIOL (ROCALTROL) 0.25 MCG capsule Take 1 capsule (0.25 mcg total) by mouth Every Tuesday,Thursday,and Saturday with dialysis. (Patient taking differently: Take 0.25 mcg by mouth every Monday, Wednesday, and Friday. Patient takes on dialysis days)   clopidogrel (PLAVIX) 75 MG tablet Take 1 tablet (75 mg total) by mouth daily.   ezetimibe (ZETIA) 10 MG tablet TAKE 1 TABLET(10 MG) BY MOUTH DAILY   ferric citrate (AURYXIA) 1 GM 210 MG(Fe) tablet Take 630 mg by mouth 2 (two) times daily.   isosorbide mononitrate (IMDUR) 30 MG 24 hr tablet Take 1 tablet (30 mg) by mouth once daily in the evening   metoprolol tartrate (LOPRESSOR) 25 MG tablet Take  0.5 tablets (12.5 mg total) by mouth 2 (two) times daily. Do not take on morning of hemodialysis.   midodrine (PROAMATINE) 10 MG tablet TAKE 1 TABLET BY MOUTH TWICE DAILY AS NEEDED(TWICE DAILY ON HEMODIALYSIS DAYS)   nitroGLYCERIN (NITROSTAT) 0.4 MG SL tablet Place 1 tablet (0.4 mg total) under the tongue every 5 (five) minutes as needed for chest pain.   tamsulosin (FLOMAX) 0.4 MG CAPS capsule Take 0.4 mg by mouth daily.   [DISCONTINUED] pantoprazole (PROTONIX) 40 MG  tablet Take 40 mg by mouth 2 (two) times daily.      Allergies:   Patient has no known allergies.   Social History   Socioeconomic History   Marital status: Widowed    Spouse name: Not on file   Number of children: Not on file   Years of education: Not on file   Highest education level: Not on file  Occupational History   Not on file  Tobacco Use   Smoking status: Former    Packs/day: 0.50    Years: 50.00    Total pack years: 25.00    Types: Cigarettes    Quit date: 10/18/2016    Years since quitting: 5.0   Smokeless tobacco: Never  Vaping Use   Vaping Use: Former  Substance and Sexual Activity   Alcohol use: No   Drug use: No   Sexual activity: Never    Birth control/protection: Abstinence  Other Topics Concern   Not on file  Social History Narrative   Not on file   Social Determinants of Health   Financial Resource Strain: Not on file  Food Insecurity: Not on file  Transportation Needs: Not on file  Physical Activity: Not on file  Stress: Not on file  Social Connections: Not on file     Family History: The patient's family history includes Cancer in his brother, father, mother, and sister; Diabetes in his brother and father; Heart attack in his father; Hypertension in his brother, daughter, and father.  ROS:   Please see the history of present illness.     All other systems reviewed and are negative.  EKGs/Labs/Other Studies Reviewed:    The following studies were reviewed today:  B/L carotid  US 09/2021 Summary:  Right Carotid: Velocities in the right ICA are consistent with a 40-59%                 stenosis. Hemodynamically significant plaque >50%  visualized in                 the CCA.   Left Carotid: Velocities in the left ICA are consistent with a 60-79%  stenosis.                Hemodynamically significant plaque >50% visualized in the  CCA.   Vertebrals:  Bilateral vertebral arteries demonstrate antegrade flow.  Subclavians: Bilateral subclavian arteries were stenotic.   *See table(s) above for measurements and observations.  Suggest follow up study in 12 months  LHC 09/2021 Conclusions: Severe two-vessel coronary artery disease with chronic total occlusions of the proximal LCx and RCA, as seen on prior catheterizations in 2018.  Coronary arteries are quite tortuous and heavily calcified. Moderate mid LMCA and mid LAD disease with up to 50% stenosis. Moderate-severe mid ramus intermedius disease with 60-70% narrowing. Widely patent mid LAD stent. Heavily calcified right iliac and common femoral arteries.  There is at least 50-70% stenosis of the right common femoral artery.   Recommendations: Continue medical therapy; I will add metoprolol tartrate 12.5 mg twice daily (to be skipped on morning of dialysis). Aggressive secondary prevention of coronary artery disease, including high-intensity statin therapy and long-term clopidogrel therapy.   Nelva Bush, MD [facility]     Antiplatelet/Anticoag Continue indefinite antiplatelet monotherapy with clopidogrel 75 mg daily.  Discharge Date In the absence of any other complications or medical issues, we expect the patient to be ready for discharge from a cath perspective on 10/09/2021.   Coronary Diagrams  Diagnostic  Dominance: Right   EKG:  EKG is ordered today.  The ekg ordered today demonstrates SB, 1st degree AV block, nonspecific ST/T wave changes  Recent Labs: 10/02/2021: BUN 30; Creatinine, Ser 7.56;  Hemoglobin 10.4; Platelets 156; Potassium 4.1; Sodium 140  Recent Lipid Panel    Component Value Date/Time   CHOL 131 05/12/2019 1134   CHOL 198 03/05/2019 1526   TRIG 161 (H) 05/12/2019 1134   HDL 31 (L) 05/12/2019 1134   HDL 33 (L) 03/05/2019 1526   CHOLHDL 4.2 05/12/2019 1134   VLDL 32 05/12/2019 1134   LDLCALC 68 05/12/2019 1134   LDLCALC 125 (H) 03/05/2019 1526    Physical Exam:    VS:  BP 110/60 (BP Location: Left Arm, Patient Position: Sitting, Cuff Size: Normal)   Pulse (!) 55   Ht '5\' 7"'$  (1.702 m)   Wt 161 lb (73 kg)   SpO2 99%   BMI 25.22 kg/m     Wt Readings from Last 3 Encounters:  10/23/21 161 lb (73 kg)  10/09/21 165 lb (74.8 kg)  10/02/21 161 lb (73 kg)     GEN:  Well nourished, well developed in no acute distress HEENT: Normal NECK: No JVD; No carotid bruits LYMPHATICS: No lymphadenopathy CARDIAC: bradycardia, RR, + murmur, no rubs, gallops RESPIRATORY:  Clear to auscultation without rales, wheezing or rhonchi  ABDOMEN: Soft, non-tender, non-distended MUSCULOSKELETAL:  No edema; No deformity  SKIN: Warm and dry NEUROLOGIC:  Alert and oriented x 3 PSYCHIATRIC:  Normal affect   ASSESSMENT:    1. Coronary artery disease involving native coronary artery of native heart with other form of angina pectoris ([place])   2. Hyperlipidemia LDL goal <70   3. Murmur   4. Bilateral carotid artery disease, unspecified type ([place])   5. Gastroesophageal reflux disease, unspecified whether esophagitis present   6. PAD (peripheral artery disease) (HCC)    PLAN:    In order of problems listed above:  CAD s/p PCI mLAD with atypical angina LHC showed CTO Lcx and RCA as seen on prior cath in 2018, moderate LMCA and mLAD disease, moderate-severe mid ramus intermedius with 60-70% narrowing, widely patent mLAD stent. Medical management was recommended. He reports atypical angina that sounds more like GERD. I will refill Protonix. Continue Plavix, statin, Imdur, and BB  therapy.   Labile HTN BP today is good. He has midodrine '10mg'$  BID to use on dialysis days. He is taking metoprolol 12.5 BID on non-dialysis days. Continue Continue Imdur '30mg'$  daily.   HLD LDL 68 in 2021, continue Lipitor and Zetia. I will update a fasting lipid panel.   PAD Moderate bilateral carotid artery disease Recent imaging showed 40-59% right ICA, >50% right common carotid artery, 60-79% left ICA, >50% left common carotid artery, normal vertebral and subclavian arteries. Continue Plavix and statin therapy.   Murmur Prior echo showed normal LVEF, moderate MR and mild AI. I will repeat an echo.   Disposition: Follow up in 3 month(s) with MD/APP    Signed, Dajah Fischman Ninfa Meeker, PA-C  10/23/2021 3:18 PM    [facility]

## 2021-10-24 DIAGNOSIS — D689 Coagulation defect, unspecified: Secondary | ICD-10-CM | POA: Diagnosis not present

## 2021-10-24 DIAGNOSIS — D631 Anemia in chronic kidney disease: Secondary | ICD-10-CM | POA: Diagnosis not present

## 2021-10-24 DIAGNOSIS — N2581 Secondary hyperparathyroidism of renal origin: Secondary | ICD-10-CM | POA: Diagnosis not present

## 2021-10-24 DIAGNOSIS — N186 End stage renal disease: Secondary | ICD-10-CM | POA: Diagnosis not present

## 2021-10-24 DIAGNOSIS — Z992 Dependence on renal dialysis: Secondary | ICD-10-CM | POA: Diagnosis not present

## 2021-10-26 DIAGNOSIS — N186 End stage renal disease: Secondary | ICD-10-CM | POA: Diagnosis not present

## 2021-10-26 DIAGNOSIS — Z992 Dependence on renal dialysis: Secondary | ICD-10-CM | POA: Diagnosis not present

## 2021-10-26 DIAGNOSIS — N2581 Secondary hyperparathyroidism of renal origin: Secondary | ICD-10-CM | POA: Diagnosis not present

## 2021-10-26 DIAGNOSIS — D689 Coagulation defect, unspecified: Secondary | ICD-10-CM | POA: Diagnosis not present

## 2021-10-26 DIAGNOSIS — D631 Anemia in chronic kidney disease: Secondary | ICD-10-CM | POA: Diagnosis not present

## 2021-10-29 DIAGNOSIS — N2581 Secondary hyperparathyroidism of renal origin: Secondary | ICD-10-CM | POA: Diagnosis not present

## 2021-10-29 DIAGNOSIS — Z992 Dependence on renal dialysis: Secondary | ICD-10-CM | POA: Diagnosis not present

## 2021-10-29 DIAGNOSIS — D631 Anemia in chronic kidney disease: Secondary | ICD-10-CM | POA: Diagnosis not present

## 2021-10-29 DIAGNOSIS — D689 Coagulation defect, unspecified: Secondary | ICD-10-CM | POA: Diagnosis not present

## 2021-10-29 DIAGNOSIS — N186 End stage renal disease: Secondary | ICD-10-CM | POA: Diagnosis not present

## 2021-10-30 ENCOUNTER — Telehealth: Payer: Self-pay | Admitting: Cardiovascular Disease

## 2021-10-30 DIAGNOSIS — R112 Nausea with vomiting, unspecified: Secondary | ICD-10-CM | POA: Diagnosis not present

## 2021-10-30 DIAGNOSIS — Z992 Dependence on renal dialysis: Secondary | ICD-10-CM | POA: Diagnosis not present

## 2021-10-30 DIAGNOSIS — Z7901 Long term (current) use of anticoagulants: Secondary | ICD-10-CM | POA: Diagnosis not present

## 2021-10-30 DIAGNOSIS — K229 Disease of esophagus, unspecified: Secondary | ICD-10-CM | POA: Diagnosis not present

## 2021-10-30 DIAGNOSIS — K219 Gastro-esophageal reflux disease without esophagitis: Secondary | ICD-10-CM | POA: Diagnosis not present

## 2021-10-30 NOTE — Telephone Encounter (Signed)
   Pre-operative Risk Assessment    Patient Name: Roger [name]  DOB: 25-Feb-1941 MRN: [id number]      Request for Surgical Clearance    Procedure:   endoscopy   Date of Surgery:  Clearance 12/20/21                                 Surgeon:  Dr Alessandra Bevels Surgeon's Group or Practice Name:  [facility]  Phone number:  [phone] Fax number:  [phone]   Type of Clearance Requested:   - Pharmacy:  Hold Clopidogrel (Plavix) 5 days   Type of Anesthesia:  Not Indicated   Additional requests/questions:    Manfred Arch   10/30/2021, 4:37 PM

## 2021-10-30 NOTE — Telephone Encounter (Signed)
   Patient Name: Roger [name]  DOB: 10-31-1940 MRN: [id number]  Primary Cardiologist: [name], MD  Chart reviewed as part of pre-operative protocol coverage.   81 year old male with a history of CAD s/p PCI/DES-LAD in 2018, PAF not on DOAC secondary to history of GI bleeding, HFpEF, ESRD on HD (TTS) since 10/2016, PAD, carotid artery disease, anemia of chronic disease, prostate cancer, HTN, HLD, and tobacco use.   Patient with abnormal Lexiscan in 09/2021. F/u LHC showed severe 2V CAD with CTO of the pLCx and RCA, as seen on prior catheterizations in 2018, moderate mid LMCA and m LAD, mod-severe mid ramus intermedius disease with 60-70% stenosis, widely patent mLAD stent. Medical therapy was recommended.  We received surgical clearance request for endoscopy scheduled for 12/20/2021 with request to hold Plavix for 5 days prior to procedure. Dr. Fletcher Anon, please advise on holding Plavix prior to procedure.   Please route your response to P CV DIV PREOP.   Thank you.   Lenna Sciara, NP 10/30/2021, 5:19 PM

## 2021-10-31 DIAGNOSIS — N2581 Secondary hyperparathyroidism of renal origin: Secondary | ICD-10-CM | POA: Diagnosis not present

## 2021-10-31 DIAGNOSIS — D689 Coagulation defect, unspecified: Secondary | ICD-10-CM | POA: Diagnosis not present

## 2021-10-31 DIAGNOSIS — D631 Anemia in chronic kidney disease: Secondary | ICD-10-CM | POA: Diagnosis not present

## 2021-10-31 DIAGNOSIS — Z992 Dependence on renal dialysis: Secondary | ICD-10-CM | POA: Diagnosis not present

## 2021-10-31 DIAGNOSIS — N186 End stage renal disease: Secondary | ICD-10-CM | POA: Diagnosis not present

## 2021-11-02 DIAGNOSIS — N186 End stage renal disease: Secondary | ICD-10-CM | POA: Diagnosis not present

## 2021-11-02 DIAGNOSIS — N2581 Secondary hyperparathyroidism of renal origin: Secondary | ICD-10-CM | POA: Diagnosis not present

## 2021-11-02 DIAGNOSIS — D631 Anemia in chronic kidney disease: Secondary | ICD-10-CM | POA: Diagnosis not present

## 2021-11-02 DIAGNOSIS — D689 Coagulation defect, unspecified: Secondary | ICD-10-CM | POA: Diagnosis not present

## 2021-11-02 DIAGNOSIS — Z992 Dependence on renal dialysis: Secondary | ICD-10-CM | POA: Diagnosis not present

## 2021-11-05 DIAGNOSIS — N2581 Secondary hyperparathyroidism of renal origin: Secondary | ICD-10-CM | POA: Diagnosis not present

## 2021-11-05 DIAGNOSIS — N186 End stage renal disease: Secondary | ICD-10-CM | POA: Diagnosis not present

## 2021-11-05 DIAGNOSIS — D631 Anemia in chronic kidney disease: Secondary | ICD-10-CM | POA: Diagnosis not present

## 2021-11-05 DIAGNOSIS — D689 Coagulation defect, unspecified: Secondary | ICD-10-CM | POA: Diagnosis not present

## 2021-11-05 DIAGNOSIS — Z992 Dependence on renal dialysis: Secondary | ICD-10-CM | POA: Diagnosis not present

## 2021-11-06 NOTE — Telephone Encounter (Signed)
Okay to hold Plavix 5 days before.

## 2021-11-07 DIAGNOSIS — D631 Anemia in chronic kidney disease: Secondary | ICD-10-CM | POA: Diagnosis not present

## 2021-11-07 DIAGNOSIS — D689 Coagulation defect, unspecified: Secondary | ICD-10-CM | POA: Diagnosis not present

## 2021-11-07 DIAGNOSIS — N2581 Secondary hyperparathyroidism of renal origin: Secondary | ICD-10-CM | POA: Diagnosis not present

## 2021-11-07 DIAGNOSIS — N186 End stage renal disease: Secondary | ICD-10-CM | POA: Diagnosis not present

## 2021-11-07 DIAGNOSIS — Z992 Dependence on renal dialysis: Secondary | ICD-10-CM | POA: Diagnosis not present

## 2021-11-07 NOTE — Telephone Encounter (Signed)
     Primary Cardiologist: [name], MD  Chart reviewed as part of pre-operative protocol coverage. Given past medical history and time since last visit, based on ACC/AHA guidelines, Roger [name] would be at acceptable risk for the planned procedure without further cardiovascular testing.   His Plavix may be held for 5 days prior to his procedure.  Please resume as soon as hemostasis is achieved.  I will route this recommendation to the requesting party via Epic fax function and remove from pre-op pool.  Please call with questions.  Jossie Ng. Tyee Vandevoorde NP-C     11/07/2021, 9:13 AM [place] [address] Office [phone] Fax [phone]

## 2021-11-09 DIAGNOSIS — D689 Coagulation defect, unspecified: Secondary | ICD-10-CM | POA: Diagnosis not present

## 2021-11-09 DIAGNOSIS — N2581 Secondary hyperparathyroidism of renal origin: Secondary | ICD-10-CM | POA: Diagnosis not present

## 2021-11-09 DIAGNOSIS — Z992 Dependence on renal dialysis: Secondary | ICD-10-CM | POA: Diagnosis not present

## 2021-11-09 DIAGNOSIS — D631 Anemia in chronic kidney disease: Secondary | ICD-10-CM | POA: Diagnosis not present

## 2021-11-09 DIAGNOSIS — N186 End stage renal disease: Secondary | ICD-10-CM | POA: Diagnosis not present

## 2021-11-12 DIAGNOSIS — N2581 Secondary hyperparathyroidism of renal origin: Secondary | ICD-10-CM | POA: Diagnosis not present

## 2021-11-12 DIAGNOSIS — D631 Anemia in chronic kidney disease: Secondary | ICD-10-CM | POA: Diagnosis not present

## 2021-11-12 DIAGNOSIS — N186 End stage renal disease: Secondary | ICD-10-CM | POA: Diagnosis not present

## 2021-11-12 DIAGNOSIS — Z992 Dependence on renal dialysis: Secondary | ICD-10-CM | POA: Diagnosis not present

## 2021-11-12 DIAGNOSIS — D689 Coagulation defect, unspecified: Secondary | ICD-10-CM | POA: Diagnosis not present

## 2021-11-14 DIAGNOSIS — N186 End stage renal disease: Secondary | ICD-10-CM | POA: Diagnosis not present

## 2021-11-14 DIAGNOSIS — D631 Anemia in chronic kidney disease: Secondary | ICD-10-CM | POA: Diagnosis not present

## 2021-11-14 DIAGNOSIS — Z992 Dependence on renal dialysis: Secondary | ICD-10-CM | POA: Diagnosis not present

## 2021-11-14 DIAGNOSIS — N2581 Secondary hyperparathyroidism of renal origin: Secondary | ICD-10-CM | POA: Diagnosis not present

## 2021-11-14 DIAGNOSIS — D689 Coagulation defect, unspecified: Secondary | ICD-10-CM | POA: Diagnosis not present

## 2021-11-15 DIAGNOSIS — I129 Hypertensive chronic kidney disease with stage 1 through stage 4 chronic kidney disease, or unspecified chronic kidney disease: Secondary | ICD-10-CM | POA: Diagnosis not present

## 2021-11-15 DIAGNOSIS — Z992 Dependence on renal dialysis: Secondary | ICD-10-CM | POA: Diagnosis not present

## 2021-11-15 DIAGNOSIS — N186 End stage renal disease: Secondary | ICD-10-CM | POA: Diagnosis not present

## 2021-11-16 DIAGNOSIS — A499 Bacterial infection, unspecified: Secondary | ICD-10-CM | POA: Diagnosis not present

## 2021-11-16 DIAGNOSIS — D631 Anemia in chronic kidney disease: Secondary | ICD-10-CM | POA: Diagnosis not present

## 2021-11-16 DIAGNOSIS — N186 End stage renal disease: Secondary | ICD-10-CM | POA: Diagnosis not present

## 2021-11-16 DIAGNOSIS — Z992 Dependence on renal dialysis: Secondary | ICD-10-CM | POA: Diagnosis not present

## 2021-11-16 DIAGNOSIS — D689 Coagulation defect, unspecified: Secondary | ICD-10-CM | POA: Diagnosis not present

## 2021-11-16 DIAGNOSIS — N2581 Secondary hyperparathyroidism of renal origin: Secondary | ICD-10-CM | POA: Diagnosis not present

## 2021-11-19 DIAGNOSIS — D689 Coagulation defect, unspecified: Secondary | ICD-10-CM | POA: Diagnosis not present

## 2021-11-19 DIAGNOSIS — D631 Anemia in chronic kidney disease: Secondary | ICD-10-CM | POA: Diagnosis not present

## 2021-11-19 DIAGNOSIS — N2581 Secondary hyperparathyroidism of renal origin: Secondary | ICD-10-CM | POA: Diagnosis not present

## 2021-11-19 DIAGNOSIS — N186 End stage renal disease: Secondary | ICD-10-CM | POA: Diagnosis not present

## 2021-11-19 DIAGNOSIS — Z992 Dependence on renal dialysis: Secondary | ICD-10-CM | POA: Diagnosis not present

## 2021-11-19 DIAGNOSIS — A499 Bacterial infection, unspecified: Secondary | ICD-10-CM | POA: Diagnosis not present

## 2021-11-21 ENCOUNTER — Telehealth: Payer: Self-pay | Admitting: Cardiovascular Disease

## 2021-11-21 ENCOUNTER — Other Ambulatory Visit: Payer: Self-pay

## 2021-11-21 DIAGNOSIS — N2581 Secondary hyperparathyroidism of renal origin: Secondary | ICD-10-CM | POA: Diagnosis not present

## 2021-11-21 DIAGNOSIS — A499 Bacterial infection, unspecified: Secondary | ICD-10-CM | POA: Diagnosis not present

## 2021-11-21 DIAGNOSIS — Z992 Dependence on renal dialysis: Secondary | ICD-10-CM | POA: Diagnosis not present

## 2021-11-21 DIAGNOSIS — N186 End stage renal disease: Secondary | ICD-10-CM | POA: Diagnosis not present

## 2021-11-21 DIAGNOSIS — R011 Cardiac murmur, unspecified: Secondary | ICD-10-CM

## 2021-11-21 DIAGNOSIS — D631 Anemia in chronic kidney disease: Secondary | ICD-10-CM | POA: Diagnosis not present

## 2021-11-21 DIAGNOSIS — D689 Coagulation defect, unspecified: Secondary | ICD-10-CM | POA: Diagnosis not present

## 2021-11-21 NOTE — Progress Notes (Signed)
echo

## 2021-11-21 NOTE — Telephone Encounter (Signed)
Daughter called to reschedule Echo test and stated patient would not be available to do the test before the order for this test expires on 12/23/21.  Daughter stated patient is only available on Tuesdays and Thursdays.  Would need to have Echo test order extended.

## 2021-11-22 ENCOUNTER — Ambulatory Visit: Payer: Medicare Other

## 2021-11-23 DIAGNOSIS — D631 Anemia in chronic kidney disease: Secondary | ICD-10-CM | POA: Diagnosis not present

## 2021-11-23 DIAGNOSIS — A499 Bacterial infection, unspecified: Secondary | ICD-10-CM | POA: Diagnosis not present

## 2021-11-23 DIAGNOSIS — D689 Coagulation defect, unspecified: Secondary | ICD-10-CM | POA: Diagnosis not present

## 2021-11-23 DIAGNOSIS — Z992 Dependence on renal dialysis: Secondary | ICD-10-CM | POA: Diagnosis not present

## 2021-11-23 DIAGNOSIS — N2581 Secondary hyperparathyroidism of renal origin: Secondary | ICD-10-CM | POA: Diagnosis not present

## 2021-11-23 DIAGNOSIS — N186 End stage renal disease: Secondary | ICD-10-CM | POA: Diagnosis not present

## 2021-11-26 DIAGNOSIS — A499 Bacterial infection, unspecified: Secondary | ICD-10-CM | POA: Diagnosis not present

## 2021-11-26 DIAGNOSIS — N2581 Secondary hyperparathyroidism of renal origin: Secondary | ICD-10-CM | POA: Diagnosis not present

## 2021-11-26 DIAGNOSIS — D631 Anemia in chronic kidney disease: Secondary | ICD-10-CM | POA: Diagnosis not present

## 2021-11-26 DIAGNOSIS — D689 Coagulation defect, unspecified: Secondary | ICD-10-CM | POA: Diagnosis not present

## 2021-11-26 DIAGNOSIS — Z992 Dependence on renal dialysis: Secondary | ICD-10-CM | POA: Diagnosis not present

## 2021-11-26 DIAGNOSIS — N186 End stage renal disease: Secondary | ICD-10-CM | POA: Diagnosis not present

## 2021-11-28 DIAGNOSIS — D689 Coagulation defect, unspecified: Secondary | ICD-10-CM | POA: Diagnosis not present

## 2021-11-28 DIAGNOSIS — Z992 Dependence on renal dialysis: Secondary | ICD-10-CM | POA: Diagnosis not present

## 2021-11-28 DIAGNOSIS — N186 End stage renal disease: Secondary | ICD-10-CM | POA: Diagnosis not present

## 2021-11-28 DIAGNOSIS — N2581 Secondary hyperparathyroidism of renal origin: Secondary | ICD-10-CM | POA: Diagnosis not present

## 2021-11-28 DIAGNOSIS — D631 Anemia in chronic kidney disease: Secondary | ICD-10-CM | POA: Diagnosis not present

## 2021-11-28 DIAGNOSIS — A499 Bacterial infection, unspecified: Secondary | ICD-10-CM | POA: Diagnosis not present

## 2021-11-30 DIAGNOSIS — A499 Bacterial infection, unspecified: Secondary | ICD-10-CM | POA: Diagnosis not present

## 2021-11-30 DIAGNOSIS — Z992 Dependence on renal dialysis: Secondary | ICD-10-CM | POA: Diagnosis not present

## 2021-11-30 DIAGNOSIS — N2581 Secondary hyperparathyroidism of renal origin: Secondary | ICD-10-CM | POA: Diagnosis not present

## 2021-11-30 DIAGNOSIS — N186 End stage renal disease: Secondary | ICD-10-CM | POA: Diagnosis not present

## 2021-11-30 DIAGNOSIS — D689 Coagulation defect, unspecified: Secondary | ICD-10-CM | POA: Diagnosis not present

## 2021-11-30 DIAGNOSIS — D631 Anemia in chronic kidney disease: Secondary | ICD-10-CM | POA: Diagnosis not present

## 2021-12-03 DIAGNOSIS — Z992 Dependence on renal dialysis: Secondary | ICD-10-CM | POA: Diagnosis not present

## 2021-12-03 DIAGNOSIS — A499 Bacterial infection, unspecified: Secondary | ICD-10-CM | POA: Diagnosis not present

## 2021-12-03 DIAGNOSIS — D631 Anemia in chronic kidney disease: Secondary | ICD-10-CM | POA: Diagnosis not present

## 2021-12-03 DIAGNOSIS — N186 End stage renal disease: Secondary | ICD-10-CM | POA: Diagnosis not present

## 2021-12-03 DIAGNOSIS — N2581 Secondary hyperparathyroidism of renal origin: Secondary | ICD-10-CM | POA: Diagnosis not present

## 2021-12-03 DIAGNOSIS — D689 Coagulation defect, unspecified: Secondary | ICD-10-CM | POA: Diagnosis not present

## 2021-12-05 DIAGNOSIS — N2581 Secondary hyperparathyroidism of renal origin: Secondary | ICD-10-CM | POA: Diagnosis not present

## 2021-12-05 DIAGNOSIS — D631 Anemia in chronic kidney disease: Secondary | ICD-10-CM | POA: Diagnosis not present

## 2021-12-05 DIAGNOSIS — D689 Coagulation defect, unspecified: Secondary | ICD-10-CM | POA: Diagnosis not present

## 2021-12-05 DIAGNOSIS — Z992 Dependence on renal dialysis: Secondary | ICD-10-CM | POA: Diagnosis not present

## 2021-12-05 DIAGNOSIS — N186 End stage renal disease: Secondary | ICD-10-CM | POA: Diagnosis not present

## 2021-12-05 DIAGNOSIS — A499 Bacterial infection, unspecified: Secondary | ICD-10-CM | POA: Diagnosis not present

## 2021-12-07 DIAGNOSIS — N186 End stage renal disease: Secondary | ICD-10-CM | POA: Diagnosis not present

## 2021-12-07 DIAGNOSIS — D631 Anemia in chronic kidney disease: Secondary | ICD-10-CM | POA: Diagnosis not present

## 2021-12-07 DIAGNOSIS — D689 Coagulation defect, unspecified: Secondary | ICD-10-CM | POA: Diagnosis not present

## 2021-12-07 DIAGNOSIS — N2581 Secondary hyperparathyroidism of renal origin: Secondary | ICD-10-CM | POA: Diagnosis not present

## 2021-12-07 DIAGNOSIS — Z992 Dependence on renal dialysis: Secondary | ICD-10-CM | POA: Diagnosis not present

## 2021-12-07 DIAGNOSIS — A499 Bacterial infection, unspecified: Secondary | ICD-10-CM | POA: Diagnosis not present

## 2021-12-10 DIAGNOSIS — N186 End stage renal disease: Secondary | ICD-10-CM | POA: Diagnosis not present

## 2021-12-10 DIAGNOSIS — D689 Coagulation defect, unspecified: Secondary | ICD-10-CM | POA: Diagnosis not present

## 2021-12-10 DIAGNOSIS — N2581 Secondary hyperparathyroidism of renal origin: Secondary | ICD-10-CM | POA: Diagnosis not present

## 2021-12-10 DIAGNOSIS — A499 Bacterial infection, unspecified: Secondary | ICD-10-CM | POA: Diagnosis not present

## 2021-12-10 DIAGNOSIS — Z992 Dependence on renal dialysis: Secondary | ICD-10-CM | POA: Diagnosis not present

## 2021-12-10 DIAGNOSIS — D631 Anemia in chronic kidney disease: Secondary | ICD-10-CM | POA: Diagnosis not present

## 2021-12-12 DIAGNOSIS — D689 Coagulation defect, unspecified: Secondary | ICD-10-CM | POA: Diagnosis not present

## 2021-12-12 DIAGNOSIS — D631 Anemia in chronic kidney disease: Secondary | ICD-10-CM | POA: Diagnosis not present

## 2021-12-12 DIAGNOSIS — N2581 Secondary hyperparathyroidism of renal origin: Secondary | ICD-10-CM | POA: Diagnosis not present

## 2021-12-12 DIAGNOSIS — N186 End stage renal disease: Secondary | ICD-10-CM | POA: Diagnosis not present

## 2021-12-12 DIAGNOSIS — A499 Bacterial infection, unspecified: Secondary | ICD-10-CM | POA: Diagnosis not present

## 2021-12-12 DIAGNOSIS — Z992 Dependence on renal dialysis: Secondary | ICD-10-CM | POA: Diagnosis not present

## 2021-12-13 DIAGNOSIS — Z992 Dependence on renal dialysis: Secondary | ICD-10-CM | POA: Diagnosis not present

## 2021-12-13 DIAGNOSIS — N186 End stage renal disease: Secondary | ICD-10-CM | POA: Diagnosis not present

## 2021-12-13 DIAGNOSIS — T82898A Other specified complication of vascular prosthetic devices, implants and grafts, initial encounter: Secondary | ICD-10-CM | POA: Diagnosis not present

## 2021-12-14 DIAGNOSIS — Z992 Dependence on renal dialysis: Secondary | ICD-10-CM | POA: Diagnosis not present

## 2021-12-14 DIAGNOSIS — D631 Anemia in chronic kidney disease: Secondary | ICD-10-CM | POA: Diagnosis not present

## 2021-12-14 DIAGNOSIS — N2581 Secondary hyperparathyroidism of renal origin: Secondary | ICD-10-CM | POA: Diagnosis not present

## 2021-12-14 DIAGNOSIS — A499 Bacterial infection, unspecified: Secondary | ICD-10-CM | POA: Diagnosis not present

## 2021-12-14 DIAGNOSIS — N186 End stage renal disease: Secondary | ICD-10-CM | POA: Diagnosis not present

## 2021-12-14 DIAGNOSIS — D689 Coagulation defect, unspecified: Secondary | ICD-10-CM | POA: Diagnosis not present

## 2021-12-15 DIAGNOSIS — I129 Hypertensive chronic kidney disease with stage 1 through stage 4 chronic kidney disease, or unspecified chronic kidney disease: Secondary | ICD-10-CM | POA: Diagnosis not present

## 2021-12-15 DIAGNOSIS — N186 End stage renal disease: Secondary | ICD-10-CM | POA: Diagnosis not present

## 2021-12-15 DIAGNOSIS — Z992 Dependence on renal dialysis: Secondary | ICD-10-CM | POA: Diagnosis not present

## 2021-12-17 DIAGNOSIS — N186 End stage renal disease: Secondary | ICD-10-CM | POA: Diagnosis not present

## 2021-12-17 DIAGNOSIS — A499 Bacterial infection, unspecified: Secondary | ICD-10-CM | POA: Diagnosis not present

## 2021-12-17 DIAGNOSIS — N2581 Secondary hyperparathyroidism of renal origin: Secondary | ICD-10-CM | POA: Diagnosis not present

## 2021-12-17 DIAGNOSIS — D689 Coagulation defect, unspecified: Secondary | ICD-10-CM | POA: Diagnosis not present

## 2021-12-17 DIAGNOSIS — D631 Anemia in chronic kidney disease: Secondary | ICD-10-CM | POA: Diagnosis not present

## 2021-12-17 DIAGNOSIS — Z992 Dependence on renal dialysis: Secondary | ICD-10-CM | POA: Diagnosis not present

## 2021-12-19 ENCOUNTER — Other Ambulatory Visit: Payer: Self-pay | Admitting: Physician Assistant

## 2021-12-19 DIAGNOSIS — Z992 Dependence on renal dialysis: Secondary | ICD-10-CM | POA: Diagnosis not present

## 2021-12-19 DIAGNOSIS — N186 End stage renal disease: Secondary | ICD-10-CM | POA: Diagnosis not present

## 2021-12-19 DIAGNOSIS — I779 Disorder of arteries and arterioles, unspecified: Secondary | ICD-10-CM

## 2021-12-19 DIAGNOSIS — I6523 Occlusion and stenosis of bilateral carotid arteries: Secondary | ICD-10-CM

## 2021-12-19 DIAGNOSIS — N2581 Secondary hyperparathyroidism of renal origin: Secondary | ICD-10-CM | POA: Diagnosis not present

## 2021-12-19 DIAGNOSIS — A499 Bacterial infection, unspecified: Secondary | ICD-10-CM | POA: Diagnosis not present

## 2021-12-19 DIAGNOSIS — D631 Anemia in chronic kidney disease: Secondary | ICD-10-CM | POA: Diagnosis not present

## 2021-12-19 DIAGNOSIS — D689 Coagulation defect, unspecified: Secondary | ICD-10-CM | POA: Diagnosis not present

## 2021-12-21 DIAGNOSIS — A499 Bacterial infection, unspecified: Secondary | ICD-10-CM | POA: Diagnosis not present

## 2021-12-21 DIAGNOSIS — D689 Coagulation defect, unspecified: Secondary | ICD-10-CM | POA: Diagnosis not present

## 2021-12-21 DIAGNOSIS — Z992 Dependence on renal dialysis: Secondary | ICD-10-CM | POA: Diagnosis not present

## 2021-12-21 DIAGNOSIS — N2581 Secondary hyperparathyroidism of renal origin: Secondary | ICD-10-CM | POA: Diagnosis not present

## 2021-12-21 DIAGNOSIS — N186 End stage renal disease: Secondary | ICD-10-CM | POA: Diagnosis not present

## 2021-12-21 DIAGNOSIS — D631 Anemia in chronic kidney disease: Secondary | ICD-10-CM | POA: Diagnosis not present

## 2021-12-24 DIAGNOSIS — A499 Bacterial infection, unspecified: Secondary | ICD-10-CM | POA: Diagnosis not present

## 2021-12-24 DIAGNOSIS — N2581 Secondary hyperparathyroidism of renal origin: Secondary | ICD-10-CM | POA: Diagnosis not present

## 2021-12-24 DIAGNOSIS — Z992 Dependence on renal dialysis: Secondary | ICD-10-CM | POA: Diagnosis not present

## 2021-12-24 DIAGNOSIS — D631 Anemia in chronic kidney disease: Secondary | ICD-10-CM | POA: Diagnosis not present

## 2021-12-24 DIAGNOSIS — D689 Coagulation defect, unspecified: Secondary | ICD-10-CM | POA: Diagnosis not present

## 2021-12-24 DIAGNOSIS — N186 End stage renal disease: Secondary | ICD-10-CM | POA: Diagnosis not present

## 2021-12-26 DIAGNOSIS — A499 Bacterial infection, unspecified: Secondary | ICD-10-CM | POA: Diagnosis not present

## 2021-12-26 DIAGNOSIS — N186 End stage renal disease: Secondary | ICD-10-CM | POA: Diagnosis not present

## 2021-12-26 DIAGNOSIS — D631 Anemia in chronic kidney disease: Secondary | ICD-10-CM | POA: Diagnosis not present

## 2021-12-26 DIAGNOSIS — D689 Coagulation defect, unspecified: Secondary | ICD-10-CM | POA: Diagnosis not present

## 2021-12-26 DIAGNOSIS — Z992 Dependence on renal dialysis: Secondary | ICD-10-CM | POA: Diagnosis not present

## 2021-12-26 DIAGNOSIS — N2581 Secondary hyperparathyroidism of renal origin: Secondary | ICD-10-CM | POA: Diagnosis not present

## 2021-12-28 DIAGNOSIS — N2581 Secondary hyperparathyroidism of renal origin: Secondary | ICD-10-CM | POA: Diagnosis not present

## 2021-12-28 DIAGNOSIS — Z992 Dependence on renal dialysis: Secondary | ICD-10-CM | POA: Diagnosis not present

## 2021-12-28 DIAGNOSIS — N186 End stage renal disease: Secondary | ICD-10-CM | POA: Diagnosis not present

## 2021-12-28 DIAGNOSIS — D631 Anemia in chronic kidney disease: Secondary | ICD-10-CM | POA: Diagnosis not present

## 2021-12-28 DIAGNOSIS — D689 Coagulation defect, unspecified: Secondary | ICD-10-CM | POA: Diagnosis not present

## 2021-12-28 DIAGNOSIS — A499 Bacterial infection, unspecified: Secondary | ICD-10-CM | POA: Diagnosis not present

## 2021-12-31 DIAGNOSIS — N2581 Secondary hyperparathyroidism of renal origin: Secondary | ICD-10-CM | POA: Diagnosis not present

## 2021-12-31 DIAGNOSIS — D689 Coagulation defect, unspecified: Secondary | ICD-10-CM | POA: Diagnosis not present

## 2021-12-31 DIAGNOSIS — D631 Anemia in chronic kidney disease: Secondary | ICD-10-CM | POA: Diagnosis not present

## 2021-12-31 DIAGNOSIS — Z992 Dependence on renal dialysis: Secondary | ICD-10-CM | POA: Diagnosis not present

## 2021-12-31 DIAGNOSIS — N186 End stage renal disease: Secondary | ICD-10-CM | POA: Diagnosis not present

## 2021-12-31 DIAGNOSIS — A499 Bacterial infection, unspecified: Secondary | ICD-10-CM | POA: Diagnosis not present

## 2022-01-01 ENCOUNTER — Other Ambulatory Visit: Payer: Medicare Other

## 2022-01-01 ENCOUNTER — Ambulatory Visit: Payer: Medicare Other | Attending: Medical

## 2022-01-01 DIAGNOSIS — R011 Cardiac murmur, unspecified: Secondary | ICD-10-CM

## 2022-01-01 LAB — ECHOCARDIOGRAM COMPLETE
AR max vel: 2.33 cm2
AV Area VTI: 2.05 cm2
AV Area mean vel: 2.16 cm2
AV Mean grad: 3 mmHg
AV Peak grad: 5.9 mmHg
Ao pk vel: 1.22 m/s
Area-P 1/2: 3.15 cm2
Calc EF: 61.5 %
MV VTI: 1.1 cm2
Single Plane A2C EF: 55.8 %
Single Plane A4C EF: 68.4 %

## 2022-01-02 DIAGNOSIS — Z992 Dependence on renal dialysis: Secondary | ICD-10-CM | POA: Diagnosis not present

## 2022-01-02 DIAGNOSIS — D631 Anemia in chronic kidney disease: Secondary | ICD-10-CM | POA: Diagnosis not present

## 2022-01-02 DIAGNOSIS — D689 Coagulation defect, unspecified: Secondary | ICD-10-CM | POA: Diagnosis not present

## 2022-01-02 DIAGNOSIS — N2581 Secondary hyperparathyroidism of renal origin: Secondary | ICD-10-CM | POA: Diagnosis not present

## 2022-01-02 DIAGNOSIS — N186 End stage renal disease: Secondary | ICD-10-CM | POA: Diagnosis not present

## 2022-01-02 DIAGNOSIS — A499 Bacterial infection, unspecified: Secondary | ICD-10-CM | POA: Diagnosis not present

## 2022-01-03 ENCOUNTER — Other Ambulatory Visit: Payer: Self-pay | Admitting: Cardiovascular Disease

## 2022-01-04 DIAGNOSIS — N186 End stage renal disease: Secondary | ICD-10-CM | POA: Diagnosis not present

## 2022-01-04 DIAGNOSIS — D689 Coagulation defect, unspecified: Secondary | ICD-10-CM | POA: Diagnosis not present

## 2022-01-04 DIAGNOSIS — N2581 Secondary hyperparathyroidism of renal origin: Secondary | ICD-10-CM | POA: Diagnosis not present

## 2022-01-04 DIAGNOSIS — D631 Anemia in chronic kidney disease: Secondary | ICD-10-CM | POA: Diagnosis not present

## 2022-01-04 DIAGNOSIS — Z992 Dependence on renal dialysis: Secondary | ICD-10-CM | POA: Diagnosis not present

## 2022-01-04 DIAGNOSIS — A499 Bacterial infection, unspecified: Secondary | ICD-10-CM | POA: Diagnosis not present

## 2022-01-07 DIAGNOSIS — N186 End stage renal disease: Secondary | ICD-10-CM | POA: Diagnosis not present

## 2022-01-07 DIAGNOSIS — Z992 Dependence on renal dialysis: Secondary | ICD-10-CM | POA: Diagnosis not present

## 2022-01-07 DIAGNOSIS — N2581 Secondary hyperparathyroidism of renal origin: Secondary | ICD-10-CM | POA: Diagnosis not present

## 2022-01-07 DIAGNOSIS — D689 Coagulation defect, unspecified: Secondary | ICD-10-CM | POA: Diagnosis not present

## 2022-01-07 DIAGNOSIS — D631 Anemia in chronic kidney disease: Secondary | ICD-10-CM | POA: Diagnosis not present

## 2022-01-07 DIAGNOSIS — A499 Bacterial infection, unspecified: Secondary | ICD-10-CM | POA: Diagnosis not present

## 2022-01-09 DIAGNOSIS — Z992 Dependence on renal dialysis: Secondary | ICD-10-CM | POA: Diagnosis not present

## 2022-01-09 DIAGNOSIS — N186 End stage renal disease: Secondary | ICD-10-CM | POA: Diagnosis not present

## 2022-01-09 DIAGNOSIS — D631 Anemia in chronic kidney disease: Secondary | ICD-10-CM | POA: Diagnosis not present

## 2022-01-09 DIAGNOSIS — N2581 Secondary hyperparathyroidism of renal origin: Secondary | ICD-10-CM | POA: Diagnosis not present

## 2022-01-09 DIAGNOSIS — D689 Coagulation defect, unspecified: Secondary | ICD-10-CM | POA: Diagnosis not present

## 2022-01-09 DIAGNOSIS — A499 Bacterial infection, unspecified: Secondary | ICD-10-CM | POA: Diagnosis not present

## 2022-01-11 DIAGNOSIS — N2581 Secondary hyperparathyroidism of renal origin: Secondary | ICD-10-CM | POA: Diagnosis not present

## 2022-01-11 DIAGNOSIS — A499 Bacterial infection, unspecified: Secondary | ICD-10-CM | POA: Diagnosis not present

## 2022-01-11 DIAGNOSIS — D631 Anemia in chronic kidney disease: Secondary | ICD-10-CM | POA: Diagnosis not present

## 2022-01-11 DIAGNOSIS — N186 End stage renal disease: Secondary | ICD-10-CM | POA: Diagnosis not present

## 2022-01-11 DIAGNOSIS — Z992 Dependence on renal dialysis: Secondary | ICD-10-CM | POA: Diagnosis not present

## 2022-01-11 DIAGNOSIS — D689 Coagulation defect, unspecified: Secondary | ICD-10-CM | POA: Diagnosis not present

## 2022-01-14 DIAGNOSIS — D631 Anemia in chronic kidney disease: Secondary | ICD-10-CM | POA: Diagnosis not present

## 2022-01-14 DIAGNOSIS — A499 Bacterial infection, unspecified: Secondary | ICD-10-CM | POA: Diagnosis not present

## 2022-01-14 DIAGNOSIS — N186 End stage renal disease: Secondary | ICD-10-CM | POA: Diagnosis not present

## 2022-01-14 DIAGNOSIS — D689 Coagulation defect, unspecified: Secondary | ICD-10-CM | POA: Diagnosis not present

## 2022-01-14 DIAGNOSIS — Z992 Dependence on renal dialysis: Secondary | ICD-10-CM | POA: Diagnosis not present

## 2022-01-14 DIAGNOSIS — N2581 Secondary hyperparathyroidism of renal origin: Secondary | ICD-10-CM | POA: Diagnosis not present

## 2022-01-15 DIAGNOSIS — N186 End stage renal disease: Secondary | ICD-10-CM | POA: Diagnosis not present

## 2022-01-15 DIAGNOSIS — I129 Hypertensive chronic kidney disease with stage 1 through stage 4 chronic kidney disease, or unspecified chronic kidney disease: Secondary | ICD-10-CM | POA: Diagnosis not present

## 2022-01-15 DIAGNOSIS — Z992 Dependence on renal dialysis: Secondary | ICD-10-CM | POA: Diagnosis not present

## 2022-01-16 DIAGNOSIS — D631 Anemia in chronic kidney disease: Secondary | ICD-10-CM | POA: Diagnosis not present

## 2022-01-16 DIAGNOSIS — N2581 Secondary hyperparathyroidism of renal origin: Secondary | ICD-10-CM | POA: Diagnosis not present

## 2022-01-16 DIAGNOSIS — Z992 Dependence on renal dialysis: Secondary | ICD-10-CM | POA: Diagnosis not present

## 2022-01-16 DIAGNOSIS — Z23 Encounter for immunization: Secondary | ICD-10-CM | POA: Diagnosis not present

## 2022-01-16 DIAGNOSIS — D689 Coagulation defect, unspecified: Secondary | ICD-10-CM | POA: Diagnosis not present

## 2022-01-16 DIAGNOSIS — N186 End stage renal disease: Secondary | ICD-10-CM | POA: Diagnosis not present

## 2022-01-18 DIAGNOSIS — D631 Anemia in chronic kidney disease: Secondary | ICD-10-CM | POA: Diagnosis not present

## 2022-01-18 DIAGNOSIS — D689 Coagulation defect, unspecified: Secondary | ICD-10-CM | POA: Diagnosis not present

## 2022-01-18 DIAGNOSIS — Z23 Encounter for immunization: Secondary | ICD-10-CM | POA: Diagnosis not present

## 2022-01-18 DIAGNOSIS — N2581 Secondary hyperparathyroidism of renal origin: Secondary | ICD-10-CM | POA: Diagnosis not present

## 2022-01-18 DIAGNOSIS — Z992 Dependence on renal dialysis: Secondary | ICD-10-CM | POA: Diagnosis not present

## 2022-01-18 DIAGNOSIS — N186 End stage renal disease: Secondary | ICD-10-CM | POA: Diagnosis not present

## 2022-01-21 DIAGNOSIS — N186 End stage renal disease: Secondary | ICD-10-CM | POA: Diagnosis not present

## 2022-01-21 DIAGNOSIS — D689 Coagulation defect, unspecified: Secondary | ICD-10-CM | POA: Diagnosis not present

## 2022-01-21 DIAGNOSIS — D631 Anemia in chronic kidney disease: Secondary | ICD-10-CM | POA: Diagnosis not present

## 2022-01-21 DIAGNOSIS — Z23 Encounter for immunization: Secondary | ICD-10-CM | POA: Diagnosis not present

## 2022-01-21 DIAGNOSIS — Z992 Dependence on renal dialysis: Secondary | ICD-10-CM | POA: Diagnosis not present

## 2022-01-21 DIAGNOSIS — N2581 Secondary hyperparathyroidism of renal origin: Secondary | ICD-10-CM | POA: Diagnosis not present

## 2022-01-23 DIAGNOSIS — Z992 Dependence on renal dialysis: Secondary | ICD-10-CM | POA: Diagnosis not present

## 2022-01-23 DIAGNOSIS — D631 Anemia in chronic kidney disease: Secondary | ICD-10-CM | POA: Diagnosis not present

## 2022-01-23 DIAGNOSIS — N186 End stage renal disease: Secondary | ICD-10-CM | POA: Diagnosis not present

## 2022-01-23 DIAGNOSIS — Z23 Encounter for immunization: Secondary | ICD-10-CM | POA: Diagnosis not present

## 2022-01-23 DIAGNOSIS — D689 Coagulation defect, unspecified: Secondary | ICD-10-CM | POA: Diagnosis not present

## 2022-01-23 DIAGNOSIS — N2581 Secondary hyperparathyroidism of renal origin: Secondary | ICD-10-CM | POA: Diagnosis not present

## 2022-01-24 ENCOUNTER — Ambulatory Visit: Payer: Medicare Other | Admitting: Medical

## 2022-01-24 ENCOUNTER — Telehealth: Payer: Self-pay | Admitting: Medical

## 2022-01-24 NOTE — Telephone Encounter (Signed)
Patient's daughter to see if his appointment can be virtual 12/5, because it would be easier for them.

## 2022-01-25 DIAGNOSIS — Z23 Encounter for immunization: Secondary | ICD-10-CM | POA: Diagnosis not present

## 2022-01-25 DIAGNOSIS — D689 Coagulation defect, unspecified: Secondary | ICD-10-CM | POA: Diagnosis not present

## 2022-01-25 DIAGNOSIS — Z992 Dependence on renal dialysis: Secondary | ICD-10-CM | POA: Diagnosis not present

## 2022-01-25 DIAGNOSIS — N186 End stage renal disease: Secondary | ICD-10-CM | POA: Diagnosis not present

## 2022-01-25 DIAGNOSIS — D631 Anemia in chronic kidney disease: Secondary | ICD-10-CM | POA: Diagnosis not present

## 2022-01-25 DIAGNOSIS — N2581 Secondary hyperparathyroidism of renal origin: Secondary | ICD-10-CM | POA: Diagnosis not present

## 2022-01-28 ENCOUNTER — Telehealth: Payer: Self-pay | Admitting: Medical

## 2022-01-28 DIAGNOSIS — Z23 Encounter for immunization: Secondary | ICD-10-CM | POA: Diagnosis not present

## 2022-01-28 DIAGNOSIS — D689 Coagulation defect, unspecified: Secondary | ICD-10-CM | POA: Diagnosis not present

## 2022-01-28 DIAGNOSIS — D631 Anemia in chronic kidney disease: Secondary | ICD-10-CM | POA: Diagnosis not present

## 2022-01-28 DIAGNOSIS — Z992 Dependence on renal dialysis: Secondary | ICD-10-CM | POA: Diagnosis not present

## 2022-01-28 DIAGNOSIS — N186 End stage renal disease: Secondary | ICD-10-CM | POA: Diagnosis not present

## 2022-01-28 DIAGNOSIS — N2581 Secondary hyperparathyroidism of renal origin: Secondary | ICD-10-CM | POA: Diagnosis not present

## 2022-01-28 NOTE — Telephone Encounter (Signed)
Patient Consent for Virtual Visit     [name], you are scheduled for a virtual visit with your provider today.  Just as we do with appointments in the office, we must obtain your consent to participate.  Your consent will be active for this visit and any virtual visit you may have with one of our providers in the next 365 days.  If you have a MyChart account, I can also send a copy of this consent to you electronically.  All virtual visits are billed to your insurance company just like a traditional visit in the office.  As this is a virtual visit, video technology does not allow for your provider to perform a traditional examination.  This may limit your provider's ability to fully assess your condition.  If your provider identifies any concerns that need to be evaluated in person or the need to arrange testing such as labs, EKG, etc, we will make arrangements to do so.  Although advances in technology are sophisticated, we cannot ensure that it will always work on either your end or our end.  If the connection with a video visit is poor, we may have to switch to a telephone visit.  With either a video or telephone visit, we are not always able to ensure that we have a secure connection.   I need to obtain your verbal consent now.   Are you willing to proceed with your visit today?    Roger [name] has provided verbal consent on 01/28/2022 for a virtual visit (video or telephone).   CONSENT FOR VIRTUAL VISIT FOR:  Roger [name]  By participating in this virtual visit I agree to the following:  I hereby voluntarily request, consent and authorize [facility] and its employed or contracted physicians, physician assistants, nurse practitioners or other licensed health care professionals (the Practitioner), to provide me with telemedicine health care services (the "Services") as deemed necessary by the treating Practitioner. I acknowledge and consent to receive the Services by the  Practitioner via telemedicine. I understand that the telemedicine visit will involve communicating with the Practitioner through live audiovisual communication technology and the disclosure of certain medical information by electronic transmission. I acknowledge that I have been given the opportunity to request an in-person assessment or other available alternative prior to the telemedicine visit and am voluntarily participating in the telemedicine visit.  I understand that I have the right to withhold or withdraw my consent to the use of telemedicine in the course of my care at any time, without affecting my right to future care or treatment, and that the Practitioner or I may terminate the telemedicine visit at any time. I understand that I have the right to inspect all information obtained and/or recorded in the course of the telemedicine visit and may receive copies of available information for a reasonable fee.  I understand that some of the potential risks of receiving the Services via telemedicine include:  Delay or interruption in medical evaluation due to technological equipment failure or disruption; Information transmitted may not be sufficient (e.g. poor resolution of images) to allow for appropriate medical decision making by the Practitioner; and/or  In rare instances, security protocols could fail, causing a breach of personal health information.  Furthermore, I acknowledge that it is my responsibility to provide information about my medical history, conditions and care that is complete and accurate to the best of my ability. I acknowledge that Practitioner's advice, recommendations, and/or decision may be based  on factors not within their control, such as incomplete or inaccurate data provided by me or distortions of diagnostic images or specimens that may result from electronic transmissions. I understand that the practice of medicine is not an exact science and that Practitioner makes no  warranties or guarantees regarding treatment outcomes. I acknowledge that a copy of this consent can be made available to me via my patient portal ([facility]), or I can request a printed copy by calling the office of [facility].    I understand that my insurance will be billed for this visit.   I have read or had this consent read to me. I understand the contents of this consent, which adequately explains the benefits and risks of the Services being provided via telemedicine.  I have been provided ample opportunity to ask questions regarding this consent and the Services and have had my questions answered to my satisfaction. I give my informed consent for the services to be provided through the use of telemedicine in my medical care

## 2022-01-29 ENCOUNTER — Other Ambulatory Visit: Payer: Self-pay | Admitting: Gastroenterology

## 2022-01-30 DIAGNOSIS — D689 Coagulation defect, unspecified: Secondary | ICD-10-CM | POA: Diagnosis not present

## 2022-01-30 DIAGNOSIS — N186 End stage renal disease: Secondary | ICD-10-CM | POA: Diagnosis not present

## 2022-01-30 DIAGNOSIS — Z992 Dependence on renal dialysis: Secondary | ICD-10-CM | POA: Diagnosis not present

## 2022-01-30 DIAGNOSIS — N2581 Secondary hyperparathyroidism of renal origin: Secondary | ICD-10-CM | POA: Diagnosis not present

## 2022-01-30 DIAGNOSIS — D631 Anemia in chronic kidney disease: Secondary | ICD-10-CM | POA: Diagnosis not present

## 2022-01-30 DIAGNOSIS — Z23 Encounter for immunization: Secondary | ICD-10-CM | POA: Diagnosis not present

## 2022-02-01 DIAGNOSIS — D631 Anemia in chronic kidney disease: Secondary | ICD-10-CM | POA: Diagnosis not present

## 2022-02-01 DIAGNOSIS — D689 Coagulation defect, unspecified: Secondary | ICD-10-CM | POA: Diagnosis not present

## 2022-02-01 DIAGNOSIS — Z992 Dependence on renal dialysis: Secondary | ICD-10-CM | POA: Diagnosis not present

## 2022-02-01 DIAGNOSIS — Z23 Encounter for immunization: Secondary | ICD-10-CM | POA: Diagnosis not present

## 2022-02-01 DIAGNOSIS — N2581 Secondary hyperparathyroidism of renal origin: Secondary | ICD-10-CM | POA: Diagnosis not present

## 2022-02-01 DIAGNOSIS — N186 End stage renal disease: Secondary | ICD-10-CM | POA: Diagnosis not present

## 2022-02-03 DIAGNOSIS — D631 Anemia in chronic kidney disease: Secondary | ICD-10-CM | POA: Diagnosis not present

## 2022-02-03 DIAGNOSIS — Z23 Encounter for immunization: Secondary | ICD-10-CM | POA: Diagnosis not present

## 2022-02-03 DIAGNOSIS — N2581 Secondary hyperparathyroidism of renal origin: Secondary | ICD-10-CM | POA: Diagnosis not present

## 2022-02-03 DIAGNOSIS — D689 Coagulation defect, unspecified: Secondary | ICD-10-CM | POA: Diagnosis not present

## 2022-02-03 DIAGNOSIS — Z992 Dependence on renal dialysis: Secondary | ICD-10-CM | POA: Diagnosis not present

## 2022-02-03 DIAGNOSIS — N186 End stage renal disease: Secondary | ICD-10-CM | POA: Diagnosis not present

## 2022-02-05 DIAGNOSIS — D631 Anemia in chronic kidney disease: Secondary | ICD-10-CM | POA: Diagnosis not present

## 2022-02-05 DIAGNOSIS — D689 Coagulation defect, unspecified: Secondary | ICD-10-CM | POA: Diagnosis not present

## 2022-02-05 DIAGNOSIS — Z23 Encounter for immunization: Secondary | ICD-10-CM | POA: Diagnosis not present

## 2022-02-05 DIAGNOSIS — N2581 Secondary hyperparathyroidism of renal origin: Secondary | ICD-10-CM | POA: Diagnosis not present

## 2022-02-05 DIAGNOSIS — Z992 Dependence on renal dialysis: Secondary | ICD-10-CM | POA: Diagnosis not present

## 2022-02-05 DIAGNOSIS — N186 End stage renal disease: Secondary | ICD-10-CM | POA: Diagnosis not present

## 2022-02-06 ENCOUNTER — Ambulatory Visit: Payer: Medicare Other | Admitting: Cardiovascular Disease

## 2022-02-08 DIAGNOSIS — Z992 Dependence on renal dialysis: Secondary | ICD-10-CM | POA: Diagnosis not present

## 2022-02-08 DIAGNOSIS — N2581 Secondary hyperparathyroidism of renal origin: Secondary | ICD-10-CM | POA: Diagnosis not present

## 2022-02-08 DIAGNOSIS — D689 Coagulation defect, unspecified: Secondary | ICD-10-CM | POA: Diagnosis not present

## 2022-02-08 DIAGNOSIS — N186 End stage renal disease: Secondary | ICD-10-CM | POA: Diagnosis not present

## 2022-02-08 DIAGNOSIS — D631 Anemia in chronic kidney disease: Secondary | ICD-10-CM | POA: Diagnosis not present

## 2022-02-08 DIAGNOSIS — Z23 Encounter for immunization: Secondary | ICD-10-CM | POA: Diagnosis not present

## 2022-02-11 DIAGNOSIS — N2581 Secondary hyperparathyroidism of renal origin: Secondary | ICD-10-CM | POA: Diagnosis not present

## 2022-02-11 DIAGNOSIS — D689 Coagulation defect, unspecified: Secondary | ICD-10-CM | POA: Diagnosis not present

## 2022-02-11 DIAGNOSIS — D631 Anemia in chronic kidney disease: Secondary | ICD-10-CM | POA: Diagnosis not present

## 2022-02-11 DIAGNOSIS — N186 End stage renal disease: Secondary | ICD-10-CM | POA: Diagnosis not present

## 2022-02-11 DIAGNOSIS — Z23 Encounter for immunization: Secondary | ICD-10-CM | POA: Diagnosis not present

## 2022-02-11 DIAGNOSIS — Z992 Dependence on renal dialysis: Secondary | ICD-10-CM | POA: Diagnosis not present

## 2022-02-13 DIAGNOSIS — N2581 Secondary hyperparathyroidism of renal origin: Secondary | ICD-10-CM | POA: Diagnosis not present

## 2022-02-13 DIAGNOSIS — N186 End stage renal disease: Secondary | ICD-10-CM | POA: Diagnosis not present

## 2022-02-13 DIAGNOSIS — Z992 Dependence on renal dialysis: Secondary | ICD-10-CM | POA: Diagnosis not present

## 2022-02-13 DIAGNOSIS — Z23 Encounter for immunization: Secondary | ICD-10-CM | POA: Diagnosis not present

## 2022-02-13 DIAGNOSIS — D631 Anemia in chronic kidney disease: Secondary | ICD-10-CM | POA: Diagnosis not present

## 2022-02-13 DIAGNOSIS — D689 Coagulation defect, unspecified: Secondary | ICD-10-CM | POA: Diagnosis not present

## 2022-02-14 DIAGNOSIS — I129 Hypertensive chronic kidney disease with stage 1 through stage 4 chronic kidney disease, or unspecified chronic kidney disease: Secondary | ICD-10-CM | POA: Diagnosis not present

## 2022-02-14 DIAGNOSIS — Z992 Dependence on renal dialysis: Secondary | ICD-10-CM | POA: Diagnosis not present

## 2022-02-14 DIAGNOSIS — N186 End stage renal disease: Secondary | ICD-10-CM | POA: Diagnosis not present

## 2022-02-16 DIAGNOSIS — D689 Coagulation defect, unspecified: Secondary | ICD-10-CM | POA: Diagnosis not present

## 2022-02-16 DIAGNOSIS — D631 Anemia in chronic kidney disease: Secondary | ICD-10-CM | POA: Diagnosis not present

## 2022-02-16 DIAGNOSIS — N186 End stage renal disease: Secondary | ICD-10-CM | POA: Diagnosis not present

## 2022-02-16 DIAGNOSIS — Z992 Dependence on renal dialysis: Secondary | ICD-10-CM | POA: Diagnosis not present

## 2022-02-16 DIAGNOSIS — N2581 Secondary hyperparathyroidism of renal origin: Secondary | ICD-10-CM | POA: Diagnosis not present

## 2022-02-18 DIAGNOSIS — N2581 Secondary hyperparathyroidism of renal origin: Secondary | ICD-10-CM | POA: Diagnosis not present

## 2022-02-18 DIAGNOSIS — Z992 Dependence on renal dialysis: Secondary | ICD-10-CM | POA: Diagnosis not present

## 2022-02-18 DIAGNOSIS — D689 Coagulation defect, unspecified: Secondary | ICD-10-CM | POA: Diagnosis not present

## 2022-02-18 DIAGNOSIS — D631 Anemia in chronic kidney disease: Secondary | ICD-10-CM | POA: Diagnosis not present

## 2022-02-18 DIAGNOSIS — N186 End stage renal disease: Secondary | ICD-10-CM | POA: Diagnosis not present

## 2022-02-18 NOTE — Progress Notes (Unsigned)
Virtual Visit via Telephone Note   Because of Roger [name]'s co-morbid illnesses, he is at least at moderate risk for complications without adequate follow up.  This format is felt to be most appropriate for this patient at this time.  The patient did not have access to video technology/had technical difficulties with video requiring transitioning to audio format only (telephone).  All issues noted in this document were discussed and addressed.  No physical exam could be performed with this format.  Please refer to the patient's chart for his consent to telehealth for [name].    Date:  02/19/2022   ID:  Roger [name], DOB 05-15-1940, MRN [id number] The patient was identified using 2 identifiers.  Patient Location: Home Provider Location: Office/Clinic   PCP:  Lujean Amel, [place] Providers Cardiologist:  [name], MD {  Evaluation Performed:  Follow-Up Visit  Chief Complaint:  3 month follow-up  History of Present Illness:    Roger [name] is a 81 y.o. male with CAD status post PCI/DES to the LAD in 2018, PAF not on [place] secondary to history of GI bleeding, HFpEF, ESRD on HD (TTS) since 10/2016, PAD, carotid artery disease, anemia of chronic disease, prostate cancer, HTN, HLD, and tobacco use who presents for 3 month follow-up.   He was admitted to the hospital in 10/2016 with NSTEMI in the setting of GI bleed with anemia.  Diagnostic LHC showed CTO of the RCA with left-to-right collaterals as well as a CTO of the LCx which was a small vessel.  The mid LAD had a 70% stenosis.  There were no good targets for bypass surgery, and in this setting, he subsequently underwent staged PCI/DES to the LAD in 11/2016.  Echo at that time showed normal LV systolic function.  He later developed recurrent GI bleeding on DAPT and has been on monotherapy with Plavix since.  He was admitted to the hospital in 07/2017 with chest pain, ruling out.  Echo showed normal  LV systolic function.  Initially, there was concern for possible vegetations on his aortic and mitral valves, however upon further evaluation it was felt the valves were heavily calcified and not significantly changed from prior echoes.  Stress testing in 07/2017 and again in 09/2018, performed in the setting of chest pain, was nonischemic.  The chest pain as noted in 09/2018 was during dialysis.  He was seen in the office in 02/2019 and was doing well, without chest pain.  His midodrine had been increased to 10 mg twice daily on dialysis days.  He was also having to hold metoprolol and Imdur on the morning of dialysis, and with these changes he had not been experiencing significant hypotension or lightheadedness with HD.  He did continue to note significant fatigue following HD.  Carotid artery ultrasound in 02/2019 showed stable bilateral ICA 40 to 59% stenosis and antegrade flow in the bilateral vertebral arteries.  He was referred to the [facility] kidney transplant program and deemed to not be a candidate for transplant due to age, comorbid conditions, and lack of a living donor.  He was seen in the office on 09/24/2019 and noted an increase in chest pain and SOB occurring with hemodialysis with associated episodes of hypotension. He was intermittently requiring supplemental oxygen with his dialysis and continued to have abdominal spasms during hemodialysis. He underwent echo on 10/29/2019, that showed an EF of 60-65%, no RWMA, Gr1DD, mild LVH, normal RVSF and ventricular cavity  size, moderate mitral regurgitation, and mild aortic valve insufficiency.     he underwent Lexiscan MPI on 09/25/2020 for SOB and CP, which was abnormal.  There was a small in size, moderate in severity, partially reversible defect involving the basal and mid inferior segments consistent with scar and peri-infarct ischemia.  There was also a moderate in size, small to moderate in severity, partially reversible defect involving the mid anterior,  mid anterolateral, apical anterior, and apical lateral segments that was most apparent on attenuation corrected images.  This was felt to most likely represent artifact, though scar and peri-infarct ischemia could not be excluded.  When compared to study from 09/2018, the mid/apical anterior/anterolateral defect was new.  LVEF estimated at 61%.  There was extensive coronary artery calcification and aortic atherosclerosis.  Overall, this was graded as an intermediate risk study.   He was seen 10/02/21 and reported intermittent chest discomfort. He was set up for LHC, which showed severe 2V CAD with CTO of the pLCx and RCA, as seen on prior catheterizations in 2018, moderate mid LMCA and m LAD, mod-severe mid ramus intermedius disease with 60-70% stenosis, widely patent mLAD stent. Medical therapy was recommended, and metoprolol was added for non dialysis days.  Last seen 10/2021 and the patient reported atypical chest pain. A murmur was heard and an echo was ordered.  Echo showed LVEF 55 to 60%, no wall motion abnormalities, grade 2 diastolic dysfunction, normal RV SF, moderately dilated left atrium, moderate MR, mild AI.  Today, the patient reports shortness of breath worse at night when he is coughing. He has acid reflux and does take medication for it. He doesn't smoke. Has been coughing for a long time, ever since he started dialysis. He has rare chest pain when he coughs too much. He denies any fevers or chills. No lower leg edema. No lightheadedness, dizziness, or palpitations. He will have EGD in January, for belching and burping that is problematic, this has been going on for many years. Belching can eventually lead to vomiting.   Past Medical History:  Diagnosis Date   Anemia    Anginal pain ([place])    Arthritis    "all over; bad in the legs" (12/04/2016)   CAD (coronary artery disease)    a. NSTEMI in setting of anemia; b. 10/2016 MV: reversible ant, apical, inflat defect; c. 10/2016 Cath: LM nl, LAD  40p, 53m D1 70, D2 80, RI min irregs, LCX 100ost, RCA 100p, 1074m, RPDA fills via collats from LAD, RPAV small-->Initial conservative Rx in setting of GIB w/ plan for PCI LAD if H/H stable on ASA/Plavix;  d. 12/04/16 s/p PTCA and DES to mLAD; e. 09/2018 MV: EF 54%, no ischemia.   Carotid disease, bilateral ([place]   a. 04/2017 [place]: RICA 4094-17RCCA >5>40LICA 4081-44  Depression    situational    Diastolic dysfunction    a. 10/2016 Echo: EF 55-60%, no rwma, Gr2 DD, triv MR, mildly dil LA; b. 07/2017 Echo: EF 60-65%, Gr1 DD. No rwma. AoV leaflet thickening (not felt to be SBE upon further review). Mild MR. Mildly dil LA.   [facility]   "[place]; [address]; TTS usually; [place] [facility] [place] staying w/daughter" (12/04/2016)   GERD (gastroesophageal reflux disease)    GIB (gastrointestinal bleeding) 10/2011   a. 10/2016 3u PRBCs - EGD/Colonoscopy: angiodysplasia w/ diverticulosis. No apparent bleeding. Polypecotmy->tubular adenomas.   Gout    Headache  no migraines for years   Heart murmur    High cholesterol    History of blood transfusion 10/18/2016   "related to anemia" (12/04/2016)   History of kidney stones    years ago   Hypertension    Iron deficiency anemia    Myocardial infarction ([place]) 10/18/2016   Old MI (myocardial infarction)    "found evidence of this on 10/18/2016"   PAF (paroxysmal atrial fibrillation) ([place])    Peripheral vascular disease (HCC)    BLE   Prostate cancer ([place])    a. s/p seed implantation.   Tobacco abuse    Past Surgical History:  Procedure Laterality Date   AV FISTULA PLACEMENT Right 03/28/2017   Procedure: ARTERIOVENOUS (AV) BRACHIOCEPHALIC FISTULA CREATION;  Surgeon: Angelia Mould, MD;  Location: [place];  Service: Vascular;  Laterality: Right;   CARDIAC CATHETERIZATION  10/2016   CHOLECYSTECTOMY N/A 11/03/2019   Procedure: LAPAROSCOPIC CHOLECYSTECTOMY;  Surgeon: Clovis Riley, MD;  Location: [facility];   Service: General;  Laterality: N/A;   COLONOSCOPY WITH PROPOFOL N/A 10/21/2016   Procedure: COLONOSCOPY WITH PROPOFOL;  Surgeon: Gatha Mayer, MD;  Location: [facility];  Service: Endoscopy;  Laterality: N/A;   CORONARY ANGIOPLASTY WITH STENT PLACEMENT  12/04/2016   "1 stent"   CORONARY STENT INTERVENTION N/A 12/04/2016   Procedure: CORONARY STENT INTERVENTION;  Surgeon: Wellington Hampshire, MD;  Location: [place] CV LAB;  Service: Cardiovascular;  Laterality: N/A;   ESOPHAGOGASTRODUODENOSCOPY N/A 10/21/2016   Procedure: ESOPHAGOGASTRODUODENOSCOPY (EGD);  Surgeon: Gatha Mayer, MD;  Location[facility] ENDOSCOPY;  Service: Endoscopy;  Laterality: N/A;   INSERTION OF DIALYSIS CATHETER Right 10/22/2016   Procedure: INSERTION OF TUNNELED DIALYSIS CATHETER;  Surgeon: Elam Dutch, MD;  Location: [place];  Service: Vascular;  Laterality: Right;   INTRAOPERATIVE CHOLANGIOGRAM N/A 11/03/2019   Procedure: ATTEMPTED INTRAOPERATIVE CHOLANGIOGRAM;  Surgeon: Clovis Riley, MD;  Location: [facility];  Service: General;  Laterality: N/A;   LEFT HEART CATH AND CORONARY ANGIOGRAPHY N/A 10/23/2016   Procedure: LEFT HEART CATH AND CORONARY ANGIOGRAPHY;  Surgeon: Wellington Hampshire, MD;  Location: [place] CV LAB;  Service: Cardiovascular;  Laterality: N/A;   LEFT HEART CATH AND CORONARY ANGIOGRAPHY Left 10/09/2021   Procedure: LEFT HEART CATH AND CORONARY ANGIOGRAPHY;  Surgeon: Nelva Bush, MD;  Location: [place] CV LAB;  Service: Cardiovascular;  Laterality: Left;   LIGATION OF ARTERIOVENOUS  FISTULA Right 05/12/2017   Procedure: BANDING OF RIGHT UPPER ARM ARTERIOVENOUS  FISTULA USING 6MM X 10CM GORE TEX VASCULAR GRAFT;  Surgeon: Angelia Mould, MD;  Location: [facility];  Service: Vascular;  Laterality: Right;   LIGATION OF ARTERIOVENOUS  FISTULA Right 07/03/2017   Procedure: LIGATION OF ARTERIOVENOUS  FISTULA RIGHT ARM;  Surgeon: Angelia Mould, MD;  Location: [facility] OR;  Service: Vascular;   Laterality: Right;   TONSILLECTOMY       Current Meds  Medication Sig   acetaminophen (TYLENOL) 325 MG tablet Take 2 tablets (650 mg total) by mouth every 6 (six) hours as needed for mild pain.   allopurinol (ZYLOPRIM) 100 MG tablet Take 1 tablet (100 mg total) by mouth daily.   atorvastatin (LIPITOR) 80 MG tablet TAKE 1 TABLET(80 MG) BY MOUTH DAILY AT 6 PM   b complex vitamins tablet Take 1 tablet by mouth at bedtime.    calcitRIOL (ROCALTROL) 0.25 MCG capsule Take 1 capsule (0.25 mcg total) by mouth Every Tuesday,Thursday,and Saturday with dialysis. (Patient taking differently: Take 0.25 mcg  by mouth every Monday, Wednesday, and Friday. Patient takes on dialysis days)   clopidogrel (PLAVIX) 75 MG tablet Take 1 tablet (75 mg total) by mouth daily.   ezetimibe (ZETIA) 10 MG tablet TAKE 1 TABLET(10 MG) BY MOUTH DAILY   ferric citrate (AURYXIA) 1 GM 210 MG(Fe) tablet Take 630 mg by mouth 2 (two) times daily.   isosorbide mononitrate (IMDUR) 30 MG 24 hr tablet Take 1 tablet (30 mg) by mouth once daily in the evening   metoprolol tartrate (LOPRESSOR) 25 MG tablet Take 0.5 tablets (12.5 mg total) by mouth 2 (two) times daily. Do not take on morning of hemodialysis.   midodrine (PROAMATINE) 10 MG tablet TAKE 1 TABLET BY MOUTH TWICE DAILY AS NEEDED( TWICE DAILY ON HEMODIALYSIS DAYS)   nitroGLYCERIN (NITROSTAT) 0.4 MG SL tablet Place 1 tablet (0.4 mg total) under the tongue every 5 (five) minutes as needed for chest pain.   pantoprazole (PROTONIX) 40 MG tablet Take 1 tablet (40 mg total) by mouth 2 (two) times daily.   tamsulosin (FLOMAX) 0.4 MG CAPS capsule Take 0.4 mg by mouth daily.     Allergies:   Patient has no known allergies.   Social History   Tobacco Use   Smoking status: Former    Packs/day: 0.50    Years: 50.00    Total pack years: 25.00    Types: Cigarettes    Quit date: 10/18/2016    Years since quitting: 5.3   Smokeless tobacco: Never  Vaping Use   Vaping Use: Former   Substance Use Topics   Alcohol use: No   Drug use: No     Family Hx: The patient's family history includes Cancer in his brother, father, mother, and sister; Diabetes in his brother and father; Heart attack in his father; Hypertension in his brother, daughter, and father.  ROS:   Please see the history of present illness.     All other systems reviewed and are negative.   Prior CV studies:   The following studies were reviewed today:   Echo 12/2021 1. Left ventricular ejection fraction, by estimation, is 55 to 60%. The  left ventricle has normal function. The left ventricle has no regional  wall motion abnormalities. Left ventricular diastolic parameters are  consistent with Grade II diastolic  dysfunction (pseudonormalization).   2. Right ventricular systolic function is normal. The right ventricular  size is normal. Tricuspid regurgitation signal is inadequate for assessing  PA pressure.   3. Left atrial size was moderately dilated.   4. The mitral valve is normal in structure. There is mild calcification  of the mitral valve leaflet(s). Mild mitral annular calcification.      Moderate mitral valve regurgitation. No evidence of mitral stenosis   5. The aortic valve is normal in structure. There is moderate  calcification of the aortic valve. Aortic valve regurgitation is mild.  Aortic valve sclerosis/calcification is present, without any evidence of  aortic stenosis.   6. The inferior vena cava is normal in size with greater than 50%  respiratory variability, suggesting right atrial pressure of 3 mmHg.   Cardiac cath 09/2021 Conclusions: Severe two-vessel coronary artery disease with chronic total occlusions of the proximal LCx and RCA, as seen on prior catheterizations in 2018.  Coronary arteries are quite tortuous and heavily calcified. Moderate mid LMCA and mid LAD disease with up to 50% stenosis. Moderate-severe mid ramus intermedius disease with 60-70%  narrowing. Widely patent mid LAD stent. Heavily calcified right iliac and common  femoral arteries.  There is at least 50-70% stenosis of the right common femoral artery.   Recommendations: Continue medical therapy; I will add metoprolol tartrate 12.5 mg twice daily (to be skipped on morning of dialysis). Aggressive secondary prevention of coronary artery disease, including high-intensity statin therapy and long-term clopidogrel therapy.   Nelva Bush, MD [facility]  Labs/Other Tests and Data Reviewed:    EKG:  No EKG was reviewed  Recent Labs: 10/02/2021: BUN 30; Creatinine, Ser 7.56; Hemoglobin 10.4; Platelets 156; Potassium 4.1; Sodium 140   Recent Lipid Panel Lab Results  Component Value Date/Time   CHOL 135 10/23/2021 03:33 PM   CHOL 198 03/05/2019 03:26 PM   TRIG 199 (H) 10/23/2021 03:33 PM   HDL 31 (L) 10/23/2021 03:33 PM   HDL 33 (L) 03/05/2019 03:26 PM   CHOLHDL 4.4 10/23/2021 03:33 PM   LDLCALC 64 10/23/2021 03:33 PM   LDLCALC 125 (H) 03/05/2019 03:26 PM    Wt Readings from Last 3 Encounters:  02/19/22 164 lb 14.5 oz (74.8 kg)  10/23/21 161 lb (73 kg)  10/09/21 165 lb (74.8 kg)         Objective:    Vital Signs:  BP (!) 120/59   Pulse 77   Ht '5\' 6"'$  (1.676 m)   Wt 164 lb 14.5 oz (74.8 kg)   BMI 26.62 kg/m    VITAL SIGNS:  reviewed  ASSESSMENT & PLAN:    CAD s/p PCI mLAD with atypical chest pain Patient reports rare chest pain when he coughs too much.  He denies shortness of breath.  Left heart cath in July 2023 showed two-vessel CAD with CTO's of the proximal left circumflex and RCA, seen on prior cath in 2018, moderate mid LMCA and mid LAD disease up to 50% stenosis, moderate to severe mid ramus intermediate disease of 60 to 70% narrowing, widely patent mid LAD stent.  Echo in October 2023 showed normal LVEF, grade 2 diastolic dysfunction, moderate MR, mild AI.  No plan for further ischemic workup at this time.  Continue Lipitor 80 mg daily, Plavix  75 mg daily, Zetia 10 mg daily, Lopressor 12.5 mg twice daily.  We will refill Plavix.  HTN Blood pressure is good today, continue current medications with Imdur 30 mg daily, Lopressor 12.5 mg twice daily, and midodrine 10 mg on hemodialysis days.  PAD Patient denies lifestyle limiting claudication.  Continue medical therapy.  Moderate bilateral carotid disease Ultrasound of the carotids in July 2023 showed right ICA with 40 to 59% stenosis and left ICA 60 to 79% stenosis.  Continue medical management.  Can repeat annual ultrasound.  HFpEF Echo 12/2021 showed LVEF 55-60%, no WMA, G2DD, moderately dilated, moderate MR. The patient denies lower leg edema, orthopnea or pnd. He does report chronic coughing at night that is unchanged.  Continue Lopressor.  Belching/vomiting Patient reports longstanding history of belching and burping leading to vomiting. Patient is to undergo EGD in January for further work-up.   Moderate MR Recent echo showed normal LVEF with moderate MR.  Can continue to monitor with serial echocardiograms.  Time:   Today, I have spent 20 minutes with the patient with telehealth technology discussing the above problems.     Medication Adjustments/Labs and Tests Ordered: Current medicines are reviewed at length with the patient today.  Concerns regarding medicines are outlined above.   Tests Ordered: No orders of the defined types were placed in this encounter.   Medication Changes: No orders of the defined types  were placed in this encounter.   Follow Up:  In Person in 6 month(s)  Signed, Finnegan Gatta Ninfa Meeker, PA-C  02/19/2022 9:58 AM    [place]

## 2022-02-19 ENCOUNTER — Ambulatory Visit: Payer: Medicare Other | Attending: Cardiovascular Disease | Admitting: Medical

## 2022-02-19 ENCOUNTER — Encounter: Payer: Self-pay | Admitting: Medical

## 2022-02-19 VITALS — BP 120/59 | HR 77 | Ht 66.0 in | Wt 164.9 lb

## 2022-02-19 DIAGNOSIS — I6523 Occlusion and stenosis of bilateral carotid arteries: Secondary | ICD-10-CM | POA: Diagnosis not present

## 2022-02-19 DIAGNOSIS — I340 Nonrheumatic mitral (valve) insufficiency: Secondary | ICD-10-CM

## 2022-02-19 DIAGNOSIS — I25118 Atherosclerotic heart disease of native coronary artery with other forms of angina pectoris: Secondary | ICD-10-CM | POA: Diagnosis not present

## 2022-02-19 DIAGNOSIS — I5032 Chronic diastolic (congestive) heart failure: Secondary | ICD-10-CM

## 2022-02-19 DIAGNOSIS — I10 Essential (primary) hypertension: Secondary | ICD-10-CM

## 2022-02-19 DIAGNOSIS — I739 Peripheral vascular disease, unspecified: Secondary | ICD-10-CM

## 2022-02-19 DIAGNOSIS — R198 Other specified symptoms and signs involving the digestive system and abdomen: Secondary | ICD-10-CM

## 2022-02-19 MED ORDER — CLOPIDOGREL BISULFATE 75 MG PO TABS: 75.0000 mg | ORAL_TABLET | Freq: Every day | ORAL | 3 refills | Status: DC

## 2022-02-19 NOTE — Patient Instructions (Signed)
Medication Instructions:  - Your physician recommends that you continue on your current medications as directed. Please refer to the Current Medication list given to you today.   Your Plavix (Clopidogrel) medication has been refilled.  *If you need a refill on your cardiac medications before your next appointment, please call your pharmacy*   Lab Work: - none ordered  If you have labs (blood work) drawn today and your tests are completely normal, you will receive your results only by: [facility] (if you have MyChart) OR A paper copy in the mail If you have any lab test that is abnormal or we need to change your treatment, we will call you to review the results.   Testing/Procedures: - none ordered   Follow-Up: At [facility], you and your health needs are our priority.  As part of our continuing mission to provide you with exceptional heart care, we have created designated Provider Care Teams.  These Care Teams include your primary Cardiologist (physician) and Advanced Practice Providers (APPs -  Physician Assistants and Nurse Practitioners) who all work together to provide you with the care you need, when you need it.  We recommend signing up for the patient portal called "MyChart".  Sign up information is provided on this After Visit Summary.  MyChart is used to connect with patients for Virtual Visits (Telemedicine).  Patients are able to view lab/test results, encounter notes, upcoming appointments, etc.  Non-urgent messages can be sent to your provider as well.   To learn more about what you can do with MyChart, go to [email or web address].    Your next appointment:   6 month(s)  The format for your next appointment:   In Person  Provider:   [name], MD    Other Instructions N/a  Important Information About Sugar

## 2022-02-19 NOTE — Addendum Note (Signed)
Addended by: Antony Madura on: 02/19/2022 10:00 AM   Modules accepted: Level of Service

## 2022-02-19 NOTE — Addendum Note (Signed)
Addended by: Alvis Lemmings C on: 02/19/2022 10:48 AM   Modules accepted: Orders

## 2022-02-20 DIAGNOSIS — D689 Coagulation defect, unspecified: Secondary | ICD-10-CM | POA: Diagnosis not present

## 2022-02-20 DIAGNOSIS — D631 Anemia in chronic kidney disease: Secondary | ICD-10-CM | POA: Diagnosis not present

## 2022-02-20 DIAGNOSIS — Z992 Dependence on renal dialysis: Secondary | ICD-10-CM | POA: Diagnosis not present

## 2022-02-20 DIAGNOSIS — N186 End stage renal disease: Secondary | ICD-10-CM | POA: Diagnosis not present

## 2022-02-20 DIAGNOSIS — N2581 Secondary hyperparathyroidism of renal origin: Secondary | ICD-10-CM | POA: Diagnosis not present

## 2022-02-22 DIAGNOSIS — N2581 Secondary hyperparathyroidism of renal origin: Secondary | ICD-10-CM | POA: Diagnosis not present

## 2022-02-22 DIAGNOSIS — Z992 Dependence on renal dialysis: Secondary | ICD-10-CM | POA: Diagnosis not present

## 2022-02-22 DIAGNOSIS — D631 Anemia in chronic kidney disease: Secondary | ICD-10-CM | POA: Diagnosis not present

## 2022-02-22 DIAGNOSIS — D689 Coagulation defect, unspecified: Secondary | ICD-10-CM | POA: Diagnosis not present

## 2022-02-22 DIAGNOSIS — N186 End stage renal disease: Secondary | ICD-10-CM | POA: Diagnosis not present

## 2022-02-25 DIAGNOSIS — N186 End stage renal disease: Secondary | ICD-10-CM | POA: Diagnosis not present

## 2022-02-25 DIAGNOSIS — N2581 Secondary hyperparathyroidism of renal origin: Secondary | ICD-10-CM | POA: Diagnosis not present

## 2022-02-25 DIAGNOSIS — D689 Coagulation defect, unspecified: Secondary | ICD-10-CM | POA: Diagnosis not present

## 2022-02-25 DIAGNOSIS — Z992 Dependence on renal dialysis: Secondary | ICD-10-CM | POA: Diagnosis not present

## 2022-02-25 DIAGNOSIS — D631 Anemia in chronic kidney disease: Secondary | ICD-10-CM | POA: Diagnosis not present

## 2022-02-27 DIAGNOSIS — D689 Coagulation defect, unspecified: Secondary | ICD-10-CM | POA: Diagnosis not present

## 2022-02-27 DIAGNOSIS — Z992 Dependence on renal dialysis: Secondary | ICD-10-CM | POA: Diagnosis not present

## 2022-02-27 DIAGNOSIS — N2581 Secondary hyperparathyroidism of renal origin: Secondary | ICD-10-CM | POA: Diagnosis not present

## 2022-02-27 DIAGNOSIS — N186 End stage renal disease: Secondary | ICD-10-CM | POA: Diagnosis not present

## 2022-02-27 DIAGNOSIS — D631 Anemia in chronic kidney disease: Secondary | ICD-10-CM | POA: Diagnosis not present

## 2022-03-01 DIAGNOSIS — N186 End stage renal disease: Secondary | ICD-10-CM | POA: Diagnosis not present

## 2022-03-01 DIAGNOSIS — D631 Anemia in chronic kidney disease: Secondary | ICD-10-CM | POA: Diagnosis not present

## 2022-03-01 DIAGNOSIS — N2581 Secondary hyperparathyroidism of renal origin: Secondary | ICD-10-CM | POA: Diagnosis not present

## 2022-03-01 DIAGNOSIS — Z992 Dependence on renal dialysis: Secondary | ICD-10-CM | POA: Diagnosis not present

## 2022-03-01 DIAGNOSIS — D689 Coagulation defect, unspecified: Secondary | ICD-10-CM | POA: Diagnosis not present

## 2022-03-04 DIAGNOSIS — Z992 Dependence on renal dialysis: Secondary | ICD-10-CM | POA: Diagnosis not present

## 2022-03-04 DIAGNOSIS — D689 Coagulation defect, unspecified: Secondary | ICD-10-CM | POA: Diagnosis not present

## 2022-03-04 DIAGNOSIS — D631 Anemia in chronic kidney disease: Secondary | ICD-10-CM | POA: Diagnosis not present

## 2022-03-04 DIAGNOSIS — N2581 Secondary hyperparathyroidism of renal origin: Secondary | ICD-10-CM | POA: Diagnosis not present

## 2022-03-04 DIAGNOSIS — N186 End stage renal disease: Secondary | ICD-10-CM | POA: Diagnosis not present

## 2022-03-06 DIAGNOSIS — Z992 Dependence on renal dialysis: Secondary | ICD-10-CM | POA: Diagnosis not present

## 2022-03-06 DIAGNOSIS — N186 End stage renal disease: Secondary | ICD-10-CM | POA: Diagnosis not present

## 2022-03-06 DIAGNOSIS — D689 Coagulation defect, unspecified: Secondary | ICD-10-CM | POA: Diagnosis not present

## 2022-03-06 DIAGNOSIS — N2581 Secondary hyperparathyroidism of renal origin: Secondary | ICD-10-CM | POA: Diagnosis not present

## 2022-03-06 DIAGNOSIS — D631 Anemia in chronic kidney disease: Secondary | ICD-10-CM | POA: Diagnosis not present

## 2022-03-08 DIAGNOSIS — D631 Anemia in chronic kidney disease: Secondary | ICD-10-CM | POA: Diagnosis not present

## 2022-03-08 DIAGNOSIS — D689 Coagulation defect, unspecified: Secondary | ICD-10-CM | POA: Diagnosis not present

## 2022-03-08 DIAGNOSIS — N2581 Secondary hyperparathyroidism of renal origin: Secondary | ICD-10-CM | POA: Diagnosis not present

## 2022-03-08 DIAGNOSIS — N186 End stage renal disease: Secondary | ICD-10-CM | POA: Diagnosis not present

## 2022-03-08 DIAGNOSIS — Z992 Dependence on renal dialysis: Secondary | ICD-10-CM | POA: Diagnosis not present

## 2022-03-10 DIAGNOSIS — D631 Anemia in chronic kidney disease: Secondary | ICD-10-CM | POA: Diagnosis not present

## 2022-03-10 DIAGNOSIS — D689 Coagulation defect, unspecified: Secondary | ICD-10-CM | POA: Diagnosis not present

## 2022-03-10 DIAGNOSIS — Z992 Dependence on renal dialysis: Secondary | ICD-10-CM | POA: Diagnosis not present

## 2022-03-10 DIAGNOSIS — N186 End stage renal disease: Secondary | ICD-10-CM | POA: Diagnosis not present

## 2022-03-10 DIAGNOSIS — N2581 Secondary hyperparathyroidism of renal origin: Secondary | ICD-10-CM | POA: Diagnosis not present

## 2022-03-12 DIAGNOSIS — K219 Gastro-esophageal reflux disease without esophagitis: Secondary | ICD-10-CM | POA: Diagnosis not present

## 2022-03-12 DIAGNOSIS — E7800 Pure hypercholesterolemia, unspecified: Secondary | ICD-10-CM | POA: Diagnosis not present

## 2022-03-12 DIAGNOSIS — Z79899 Other long term (current) drug therapy: Secondary | ICD-10-CM | POA: Diagnosis not present

## 2022-03-12 DIAGNOSIS — Z992 Dependence on renal dialysis: Secondary | ICD-10-CM | POA: Diagnosis not present

## 2022-03-12 DIAGNOSIS — Z0001 Encounter for general adult medical examination with abnormal findings: Secondary | ICD-10-CM | POA: Diagnosis not present

## 2022-03-12 DIAGNOSIS — N186 End stage renal disease: Secondary | ICD-10-CM | POA: Diagnosis not present

## 2022-03-12 DIAGNOSIS — R413 Other amnesia: Secondary | ICD-10-CM | POA: Diagnosis not present

## 2022-03-13 DIAGNOSIS — D631 Anemia in chronic kidney disease: Secondary | ICD-10-CM | POA: Diagnosis not present

## 2022-03-13 DIAGNOSIS — N186 End stage renal disease: Secondary | ICD-10-CM | POA: Diagnosis not present

## 2022-03-13 DIAGNOSIS — Z992 Dependence on renal dialysis: Secondary | ICD-10-CM | POA: Diagnosis not present

## 2022-03-13 DIAGNOSIS — D689 Coagulation defect, unspecified: Secondary | ICD-10-CM | POA: Diagnosis not present

## 2022-03-13 DIAGNOSIS — N2581 Secondary hyperparathyroidism of renal origin: Secondary | ICD-10-CM | POA: Diagnosis not present

## 2022-03-15 DIAGNOSIS — N186 End stage renal disease: Secondary | ICD-10-CM | POA: Diagnosis not present

## 2022-03-15 DIAGNOSIS — Z992 Dependence on renal dialysis: Secondary | ICD-10-CM | POA: Diagnosis not present

## 2022-03-15 DIAGNOSIS — N2581 Secondary hyperparathyroidism of renal origin: Secondary | ICD-10-CM | POA: Diagnosis not present

## 2022-03-15 DIAGNOSIS — D689 Coagulation defect, unspecified: Secondary | ICD-10-CM | POA: Diagnosis not present

## 2022-03-15 DIAGNOSIS — D631 Anemia in chronic kidney disease: Secondary | ICD-10-CM | POA: Diagnosis not present

## 2022-03-17 DIAGNOSIS — Z992 Dependence on renal dialysis: Secondary | ICD-10-CM | POA: Diagnosis not present

## 2022-03-17 DIAGNOSIS — N2581 Secondary hyperparathyroidism of renal origin: Secondary | ICD-10-CM | POA: Diagnosis not present

## 2022-03-17 DIAGNOSIS — D631 Anemia in chronic kidney disease: Secondary | ICD-10-CM | POA: Diagnosis not present

## 2022-03-17 DIAGNOSIS — I129 Hypertensive chronic kidney disease with stage 1 through stage 4 chronic kidney disease, or unspecified chronic kidney disease: Secondary | ICD-10-CM | POA: Diagnosis not present

## 2022-03-17 DIAGNOSIS — D689 Coagulation defect, unspecified: Secondary | ICD-10-CM | POA: Diagnosis not present

## 2022-03-17 DIAGNOSIS — N186 End stage renal disease: Secondary | ICD-10-CM | POA: Diagnosis not present

## 2022-03-20 ENCOUNTER — Encounter (HOSPITAL_COMMUNITY): Payer: Self-pay | Admitting: Gastroenterology

## 2022-03-20 DIAGNOSIS — R52 Pain, unspecified: Secondary | ICD-10-CM | POA: Diagnosis not present

## 2022-03-20 DIAGNOSIS — D631 Anemia in chronic kidney disease: Secondary | ICD-10-CM | POA: Diagnosis not present

## 2022-03-20 DIAGNOSIS — D689 Coagulation defect, unspecified: Secondary | ICD-10-CM | POA: Diagnosis not present

## 2022-03-20 DIAGNOSIS — Z992 Dependence on renal dialysis: Secondary | ICD-10-CM | POA: Diagnosis not present

## 2022-03-20 DIAGNOSIS — N2581 Secondary hyperparathyroidism of renal origin: Secondary | ICD-10-CM | POA: Diagnosis not present

## 2022-03-20 DIAGNOSIS — N186 End stage renal disease: Secondary | ICD-10-CM | POA: Diagnosis not present

## 2022-03-22 DIAGNOSIS — N186 End stage renal disease: Secondary | ICD-10-CM | POA: Diagnosis not present

## 2022-03-22 DIAGNOSIS — R52 Pain, unspecified: Secondary | ICD-10-CM | POA: Diagnosis not present

## 2022-03-22 DIAGNOSIS — Z992 Dependence on renal dialysis: Secondary | ICD-10-CM | POA: Diagnosis not present

## 2022-03-22 DIAGNOSIS — D631 Anemia in chronic kidney disease: Secondary | ICD-10-CM | POA: Diagnosis not present

## 2022-03-22 DIAGNOSIS — N2581 Secondary hyperparathyroidism of renal origin: Secondary | ICD-10-CM | POA: Diagnosis not present

## 2022-03-22 DIAGNOSIS — D689 Coagulation defect, unspecified: Secondary | ICD-10-CM | POA: Diagnosis not present

## 2022-03-25 DIAGNOSIS — R52 Pain, unspecified: Secondary | ICD-10-CM | POA: Diagnosis not present

## 2022-03-25 DIAGNOSIS — N186 End stage renal disease: Secondary | ICD-10-CM | POA: Diagnosis not present

## 2022-03-25 DIAGNOSIS — D631 Anemia in chronic kidney disease: Secondary | ICD-10-CM | POA: Diagnosis not present

## 2022-03-25 DIAGNOSIS — N2581 Secondary hyperparathyroidism of renal origin: Secondary | ICD-10-CM | POA: Diagnosis not present

## 2022-03-25 DIAGNOSIS — Z992 Dependence on renal dialysis: Secondary | ICD-10-CM | POA: Diagnosis not present

## 2022-03-25 DIAGNOSIS — D689 Coagulation defect, unspecified: Secondary | ICD-10-CM | POA: Diagnosis not present

## 2022-03-26 ENCOUNTER — Ambulatory Visit (HOSPITAL_COMMUNITY): Payer: Medicare Other | Admitting: Certified Registered"

## 2022-03-26 ENCOUNTER — Ambulatory Visit (HOSPITAL_BASED_OUTPATIENT_CLINIC_OR_DEPARTMENT_OTHER): Payer: Medicare Other | Admitting: Certified Registered"

## 2022-03-26 ENCOUNTER — Other Ambulatory Visit: Payer: Self-pay

## 2022-03-26 ENCOUNTER — Encounter (HOSPITAL_COMMUNITY)
Admission: RE | Disposition: A | Discharge status: Home / Self Care | Payer: Self-pay | Source: Home / Self Care | Attending: Gastroenterology

## 2022-03-26 ENCOUNTER — Encounter (HOSPITAL_COMMUNITY): Payer: Self-pay | Admitting: Gastroenterology

## 2022-03-26 ENCOUNTER — Ambulatory Visit (HOSPITAL_COMMUNITY)
Admission: RE | Admit: 2022-03-26 | Discharge: 2022-03-26 | Disposition: A | Discharge status: Home / Self Care | Payer: Medicare Other | Attending: Gastroenterology | Admitting: Gastroenterology

## 2022-03-26 DIAGNOSIS — I2510 Atherosclerotic heart disease of native coronary artery without angina pectoris: Secondary | ICD-10-CM | POA: Insufficient documentation

## 2022-03-26 DIAGNOSIS — I120 Hypertensive chronic kidney disease with stage 5 chronic kidney disease or end stage renal disease: Secondary | ICD-10-CM

## 2022-03-26 DIAGNOSIS — Z7902 Long term (current) use of antithrombotics/antiplatelets: Secondary | ICD-10-CM | POA: Insufficient documentation

## 2022-03-26 DIAGNOSIS — I25119 Atherosclerotic heart disease of native coronary artery with unspecified angina pectoris: Secondary | ICD-10-CM | POA: Diagnosis not present

## 2022-03-26 DIAGNOSIS — K219 Gastro-esophageal reflux disease without esophagitis: Secondary | ICD-10-CM | POA: Diagnosis not present

## 2022-03-26 DIAGNOSIS — I252 Old myocardial infarction: Secondary | ICD-10-CM | POA: Diagnosis not present

## 2022-03-26 DIAGNOSIS — E7800 Pure hypercholesterolemia, unspecified: Secondary | ICD-10-CM | POA: Diagnosis not present

## 2022-03-26 DIAGNOSIS — M109 Gout, unspecified: Secondary | ICD-10-CM | POA: Insufficient documentation

## 2022-03-26 DIAGNOSIS — K229 Disease of esophagus, unspecified: Secondary | ICD-10-CM | POA: Diagnosis not present

## 2022-03-26 DIAGNOSIS — Z8546 Personal history of malignant neoplasm of prostate: Secondary | ICD-10-CM | POA: Diagnosis not present

## 2022-03-26 DIAGNOSIS — I700 Atherosclerosis of aorta: Secondary | ICD-10-CM | POA: Diagnosis not present

## 2022-03-26 DIAGNOSIS — N186 End stage renal disease: Secondary | ICD-10-CM | POA: Insufficient documentation

## 2022-03-26 DIAGNOSIS — I739 Peripheral vascular disease, unspecified: Secondary | ICD-10-CM | POA: Insufficient documentation

## 2022-03-26 DIAGNOSIS — Z955 Presence of coronary angioplasty implant and graft: Secondary | ICD-10-CM | POA: Diagnosis not present

## 2022-03-26 DIAGNOSIS — Z992 Dependence on renal dialysis: Secondary | ICD-10-CM

## 2022-03-26 DIAGNOSIS — I080 Rheumatic disorders of both mitral and aortic valves: Secondary | ICD-10-CM | POA: Diagnosis not present

## 2022-03-26 DIAGNOSIS — Z79899 Other long term (current) drug therapy: Secondary | ICD-10-CM | POA: Diagnosis not present

## 2022-03-26 DIAGNOSIS — K2950 Unspecified chronic gastritis without bleeding: Secondary | ICD-10-CM

## 2022-03-26 DIAGNOSIS — I480 Paroxysmal atrial fibrillation: Secondary | ICD-10-CM | POA: Diagnosis not present

## 2022-03-26 DIAGNOSIS — K317 Polyp of stomach and duodenum: Secondary | ICD-10-CM | POA: Diagnosis not present

## 2022-03-26 DIAGNOSIS — Z87891 Personal history of nicotine dependence: Secondary | ICD-10-CM | POA: Insufficient documentation

## 2022-03-26 DIAGNOSIS — K449 Diaphragmatic hernia without obstruction or gangrene: Secondary | ICD-10-CM

## 2022-03-26 DIAGNOSIS — Z09 Encounter for follow-up examination after completed treatment for conditions other than malignant neoplasm: Secondary | ICD-10-CM | POA: Insufficient documentation

## 2022-03-26 DIAGNOSIS — I4891 Unspecified atrial fibrillation: Secondary | ICD-10-CM | POA: Diagnosis not present

## 2022-03-26 DIAGNOSIS — D130 Benign neoplasm of esophagus: Secondary | ICD-10-CM | POA: Diagnosis not present

## 2022-03-26 HISTORY — PX: ESOPHAGOGASTRODUODENOSCOPY (EGD) WITH PROPOFOL: SHX5813

## 2022-03-26 HISTORY — PX: BIOPSY: SHX5522

## 2022-03-26 LAB — POCT I-STAT, CHEM 8
BUN: 30 mg/dL — ABNORMAL HIGH (ref 8–23)
Calcium, Ion: 1.03 mmol/L — ABNORMAL LOW (ref 1.15–1.40)
Chloride: 99 mmol/L (ref 98–111)
Creatinine, Ser: 7 mg/dL — ABNORMAL HIGH (ref 0.61–1.24)
Glucose, Bld: 84 mg/dL (ref 70–99)
HCT: 35 % — ABNORMAL LOW (ref 39.0–52.0)
Hemoglobin: 11.9 g/dL — ABNORMAL LOW (ref 13.0–17.0)
Potassium: 3.9 mmol/L (ref 3.5–5.1)
Sodium: 139 mmol/L (ref 135–145)
TCO2: 29 mmol/L (ref 22–32)

## 2022-03-26 SURGERY — ESOPHAGOGASTRODUODENOSCOPY (EGD) WITH PROPOFOL
Anesthesia: Monitor Anesthesia Care

## 2022-03-26 MED ORDER — LIDOCAINE HCL (CARDIAC) PF 100 MG/5ML IV SOSY
PREFILLED_SYRINGE | INTRAVENOUS | Status: DC | PRN
  Administered 2022-03-26: 100 mg via INTRAVENOUS

## 2022-03-26 MED ORDER — SODIUM CHLORIDE 0.9 % IV SOLN: INTRAVENOUS | Status: DC

## 2022-03-26 MED ORDER — PHENYLEPHRINE 80 MCG/ML (10ML) SYRINGE FOR IV PUSH (FOR BLOOD PRESSURE SUPPORT)
PREFILLED_SYRINGE | INTRAVENOUS | Status: DC | PRN
  Administered 2022-03-26 (×3): 80 ug via INTRAVENOUS

## 2022-03-26 MED ORDER — EPHEDRINE SULFATE-NACL 50-0.9 MG/10ML-% IV SOSY
PREFILLED_SYRINGE | INTRAVENOUS | Status: DC | PRN
  Administered 2022-03-26: 5 mg via INTRAVENOUS

## 2022-03-26 MED ORDER — PROPOFOL 10 MG/ML IV BOLUS
INTRAVENOUS | Status: DC | PRN
  Administered 2022-03-26 (×2): 20 mg via INTRAVENOUS
  Administered 2022-03-26: 10 mg via INTRAVENOUS
  Administered 2022-03-26: 50 mg via INTRAVENOUS

## 2022-03-26 SURGICAL SUPPLY — 15 items

## 2022-03-26 NOTE — Anesthesia Postprocedure Evaluation (Signed)
Anesthesia Post Note  Patient: Roger [name]  Procedure(s) Performed: ESOPHAGOGASTRODUODENOSCOPY (EGD) WITH PROPOFOL     Patient location during evaluation: Endoscopy Anesthesia Type: MAC Level of consciousness: awake and alert Pain management: pain level controlled Vital Signs Assessment: post-procedure vital signs reviewed and stable Respiratory status: spontaneous breathing, nonlabored ventilation, respiratory function stable and patient connected to nasal cannula oxygen Cardiovascular status: blood pressure returned to baseline and stable Postop Assessment: no apparent nausea or vomiting Anesthetic complications: no  No notable events documented.  Last Vitals:  Vitals:   03/26/22 0925 03/26/22 0928  BP:    Pulse: 73 72  Resp: 13 19  Temp:    SpO2: 100% 100%    Last Pain:  Vitals:   03/26/22 0928  TempSrc:   PainSc: 0-No pain                 Mattias Walmsley L Habib Kise

## 2022-03-26 NOTE — H&P (Signed)
Primary Care Physician:  Lujean Amel, MD Primary Gastroenterologist:  Dr. Alessandra Bevels  Reason for Visit : Outpatient EGD for follow-up on esophageal nodule as well as for nausea and vomiting  HPI: Roger [name] is a 82 y.o. male here for outpatient EGD for follow-up on esophageal nodule as well as for nausea and vomiting. Patient with history of prostate cancer, gallstone pancreatitis and s/p lap chole 2021, MI 2018 s/p stenting, on plavix.        EGD 04/27/2018 - Chronic inactive gastritis negative for H. pylori. Small hiatal hernia and esophageal nodule.  It was discussed with Dr. Paulita Fujita.  Repeat EGD in 2 to 3 years was recommended.        MR Abd 05/2020 - Benign splenic lesions, hepatic hemosiderosis.                He is on dialysis for ESRD on Monday, Wednesday, Friday scheduled.        Complaining of nausea and vomiting which is worse during dialysis.  Complaining of burping.        Denies family history of GI malignancy or disease. Denies MI/stroke in the last 6 months. He is on Plavix.  Last dose on January 1.  Past Medical History:  Diagnosis Date   Anemia    Anginal pain ([place])    Arthritis    "all over; bad in the legs" (12/04/2016)   CAD (coronary artery disease)    a. NSTEMI in setting of anemia; b. 10/2016 MV: reversible ant, apical, inflat defect; c. 10/2016 Cath: LM nl, LAD 40p, 6m D1 70, D2 80, RI min irregs, LCX 100ost, RCA 100p, 104m, RPDA fills via collats from LAD, RPAV small-->Initial conservative Rx in setting of GIB w/ plan for PCI LAD if H/H stable on ASA/Plavix;  d. 12/04/16 s/p PTCA and DES to mLAD; e. 09/2018 MV: EF 54%, no ischemia.   Carotid disease, bilateral ([facility]   a. 04/2017 [place]: RICA 4030-16RCCA >5>01LICA 4009-32  Depression    situational    Diastolic dysfunction    a. 10/2016 Echo: EF 55-60%, no rwma, Gr2 DD, triv MR, mildly dil LA; b. 07/2017 Echo: EF 60-65%, Gr1 DD. No rwma. AoV leaflet thickening (not felt to be SBE upon further review). Mild MR.  Mildly dil LA.   [facility]   "[place]; [address]; [place] usually; [place] [facility] [place] staying w/daughter" (12/04/2016)   GERD (gastroesophageal reflux disease)    GIB (gastrointestinal bleeding) 10/2011   a. 10/2016 3u PRBCs - EGD/Colonoscopy: angiodysplasia w/ diverticulosis. No apparent bleeding. Polypecotmy->tubular adenomas.   Gout    Headache    no migraines for years   Heart murmur    High cholesterol    History of blood transfusion 10/18/2016   "related to anemia" (12/04/2016)   History of kidney stones    years ago   Hypertension    Iron deficiency anemia    Myocardial infarction ([facility]2016   Old MI (myocardial infarction)    "found evidence of this on 10/18/2016"   PAF (paroxysmal atrial fibrillation) ([facility]   Peripheral vascular disease ([facility])    BLE   Prostate cancer ([facility]   a. s/p seed implantation.   Tobacco abuse     Past Surgical History:  Procedure Laterality Date   AV FISTULA PLACEMENT Right 03/28/2017   Procedure: ARTERIOVENOUS (AV) BRACHIOCEPHALIC FISTULA CREATION;  Surgeon: DiAngelia MouldMD;  Location:  [facility];  Service: Vascular;  Laterality: Right;   CARDIAC CATHETERIZATION  10/2016   CHOLECYSTECTOMY N/A 11/03/2019   Procedure: LAPAROSCOPIC CHOLECYSTECTOMY;  Surgeon: Clovis Riley, MD;  Location: [facility];  Service: General;  Laterality: N/A;   COLONOSCOPY WITH PROPOFOL N/A 10/21/2016   Procedure: COLONOSCOPY WITH PROPOFOL;  Surgeon: Gatha Mayer, MD;  Location: [facility];  Service: Endoscopy;  Laterality: N/A;   CORONARY ANGIOPLASTY WITH STENT PLACEMENT  12/04/2016   "1 stent"   CORONARY STENT INTERVENTION N/A 12/04/2016   Procedure: CORONARY STENT INTERVENTION;  Surgeon: Wellington Hampshire, MD;  Location: [facility];  Service: Cardiovascular;  Laterality: N/A;   ESOPHAGOGASTRODUODENOSCOPY N/A 10/21/2016   Procedure: ESOPHAGOGASTRODUODENOSCOPY (EGD);  Surgeon: Gatha Mayer, MD;  Location: [facility]  ENDOSCOPY;  Service: Endoscopy;  Laterality: N/A;   INSERTION OF DIALYSIS CATHETER Right 10/22/2016   Procedure: INSERTION OF TUNNELED DIALYSIS CATHETER;  Surgeon: Elam Dutch, MD;  Location: [facility];  Service: Vascular;  Laterality: Right;   INTRAOPERATIVE CHOLANGIOGRAM N/A 11/03/2019   Procedure: ATTEMPTED INTRAOPERATIVE CHOLANGIOGRAM;  Surgeon: Clovis Riley, MD;  Location: [facility];  Service: General;  Laterality: N/A;   LEFT HEART CATH AND CORONARY ANGIOGRAPHY N/A 10/23/2016   Procedure: LEFT HEART CATH AND CORONARY ANGIOGRAPHY;  Surgeon: Wellington Hampshire, MD;  Location: [facility];  Service: Cardiovascular;  Laterality: N/A;   LEFT HEART CATH AND CORONARY ANGIOGRAPHY Left 10/09/2021   Procedure: LEFT HEART CATH AND CORONARY ANGIOGRAPHY;  Surgeon: Nelva Bush, MD;  Location: [facility];  Service: Cardiovascular;  Laterality: Left;   LIGATION OF ARTERIOVENOUS  FISTULA Right 05/12/2017   Procedure: BANDING OF RIGHT UPPER ARM ARTERIOVENOUS  FISTULA USING 6MM X 10CM GORE TEX VASCULAR GRAFT;  Surgeon: Angelia Mould, MD;  Location: [facility];  Service: Vascular;  Laterality: Right;   LIGATION OF ARTERIOVENOUS  FISTULA Right 07/03/2017   Procedure: LIGATION OF ARTERIOVENOUS  FISTULA RIGHT ARM;  Surgeon: Angelia Mould, MD;  Location: [facility];  Service: Vascular;  Laterality: Right;   TONSILLECTOMY      Prior to Admission medications   Medication Sig Start Date End Date Taking? Authorizing Provider  acetaminophen (TYLENOL) 325 MG tablet Take 2 tablets (650 mg total) by mouth every 6 (six) hours as needed for mild pain. 11/04/19  Yes Meuth, Brooke A, PA-C  allopurinol (ZYLOPRIM) 100 MG tablet Take 1 tablet (100 mg total) by mouth daily. 10/26/16  Yes Florencia Reasons, MD  atorvastatin (LIPITOR) 80 MG tablet TAKE 1 TABLET(80 MG) BY MOUTH DAILY AT 6 PM 11/30/18  Yes Rise Mu, PA-C  b complex vitamins tablet Take 1 tablet by mouth at bedtime.    Yes [provider]   calcitRIOL (ROCALTROL) 0.25 MCG capsule Take 1 capsule (0.25 mcg total) by mouth Every Tuesday,Thursday,and Saturday with dialysis. Patient taking differently: Take 0.25 mcg by mouth every Monday, Wednesday, and Friday with hemodialysis. Patient takes on dialysis days at dialysis 12/05/16  Yes Bhagat, Bhavinkumar, PA  clopidogrel (PLAVIX) 75 MG tablet Take 1 tablet (75 mg total) by mouth daily. 02/19/22  Yes Furth, Cadence H, PA-C  ezetimibe (ZETIA) 10 MG tablet TAKE 1 TABLET(10 MG) BY MOUTH DAILY 01/04/22  Yes Wellington Hampshire, MD  ferric citrate (AURYXIA) 1 GM 210 MG(Fe) tablet Take 420 mg by mouth 2 (two) times daily with a meal.   Yes [provider]  isosorbide mononitrate (IMDUR) 30 MG 24 hr tablet Take 1 tablet (30 mg) by  mouth once daily in the evening 07/29/18  Yes Wellington Hampshire, MD  metoprolol tartrate (LOPRESSOR) 25 MG tablet Take 0.5 tablets (12.5 mg total) by mouth 2 (two) times daily. Do not take on morning of hemodialysis. Patient taking differently: Take 12.5 mg by mouth See admin instructions. Take 12.5 mg on Monday , Wednesday and Friday in the afternoon once a day Take 12.5 mg all the other day twice a  day 10/09/21 10/09/22 Yes End, Harrell Gave, MD  midodrine (PROAMATINE) 10 MG tablet TAKE 1 TABLET BY MOUTH TWICE DAILY AS NEEDED( TWICE DAILY ON HEMODIALYSIS DAYS) Patient taking differently: Take 10 mg by mouth See admin instructions. Take Mon. Wed and Fri., Twice a day 01/04/22  Yes Wellington Hampshire, MD  nitroGLYCERIN (NITROSTAT) 0.4 MG SL tablet Place 1 tablet (0.4 mg total) under the tongue every 5 (five) minutes as needed for chest pain. 05/14/18  Yes Nat Christen, MD  pantoprazole (PROTONIX) 40 MG tablet Take 1 tablet (40 mg total) by mouth 2 (two) times daily. 10/23/21  Yes Furth, Cadence H, PA-C  tamsulosin (FLOMAX) 0.4 MG CAPS capsule Take 0.4 mg by mouth daily at 6 PM. 04/26/18  Yes [provider]    Scheduled Meds: Continuous Infusions:  sodium chloride      PRN Meds:.  Allergies as of 01/29/2022   (No Known Allergies)    Family History  Problem Relation Age of Onset   Cancer Mother    Cancer Father    Heart attack Father    Diabetes Father    Hypertension Father    Cancer Sister    Cancer Brother    Diabetes Brother    Hypertension Brother    Hypertension Daughter     Social History   Socioeconomic History   Marital status: Widowed    Spouse name: Not on file   Number of children: Not on file   Years of education: Not on file   Highest education level: Not on file  Occupational History   Not on file  Tobacco Use   Smoking status: Former    Packs/day: 0.50    Years: 50.00    Total pack years: 25.00    Types: Cigarettes    Quit date: 10/18/2016    Years since quitting: 5.4   Smokeless tobacco: Never  Vaping Use   Vaping Use: Former  Substance and Sexual Activity   Alcohol use: No   Drug use: No   Sexual activity: Never    Birth control/protection: Abstinence  Other Topics Concern   Not on file  Social History Narrative   Not on file   Social Determinants of Health   Financial Resource Strain: Not on file  Food Insecurity: Not on file  Transportation Needs: Not on file  Physical Activity: Not on file  Stress: Not on file  Social Connections: Not on file  Intimate Partner Violence: Not on file     Physical Exam: Vital signs: Vitals:   03/26/22 0809  BP: (!) 192/85  Resp: 15  Temp: 97.8 F (36.6 C)  SpO2: 100%     General:   Alert,  Well-developed, well-nourished, pleasant and cooperative in NAD Lungs: No visible respiratory distress Heart:  Regular rate and rhythm; no murmurs, clicks, rubs,  or gallops. Abdomen: Soft, nontender, nondistended, bowel sounds present Rectal:  Deferred  GI:  Lab Results: No results for input(s): "WBC", "HGB", "HCT", "PLT" in the last 72 hours. BMET No results for input(s): "NA", "K", "CL", "CO2", "GLUCOSE", "  BUN", "CREATININE", "CALCIUM" in the last 72  hours. LFT No results for input(s): "PROT", "ALBUMIN", "AST", "ALT", "ALKPHOS", "BILITOT", "BILIDIR", "IBILI" in the last 72 hours. PT/INR No results for input(s): "LABPROT", "INR" in the last 72 hours.   Studies/Results: No results found.  Impression/Plan: -Proximal esophageal nodule -Nausea and vomiting while on dialysis.  Likely related to his end-stage renal disease versus medications -Belching -History of coronary artery disease.  Plavix on hold for procedure  Recommendations ------------------------- -Proceed with EGD today.  Risks (bleeding, infection, bowel perforation that could require surgery, sedation-related changes in cardiopulmonary systems), benefits (identification and possible treatment of source of symptoms, exclusion of certain causes of symptoms), and alternatives (watchful waiting, radiographic imaging studies, empiric medical treatment)  were explained to patient/family in detail and patient wishes to proceed.     LOS: 0 days   Otis Brace  MD, [place] 03/26/2022, 8:21 AM  Contact #  [phone]

## 2022-03-26 NOTE — Op Note (Signed)
[facility] Patient Name: Roger [name] Procedure Date: 03/26/2022 MRN: [id number] Attending MD: Otis Brace , MD, [id number] Date of Birth: 1940-06-13 CSN: [id number] Age: 82 Admit Type: Outpatient Procedure:                Upper GI endoscopy Indications:              Follow-up of benign esophageal tumor Providers:                Otis Brace, MD, Adah Perl RN, RN,                            William Dalton, Technician Referring MD:              Medicines:                Sedation Administered by an Anesthesia Professional Complications:            No immediate complications. Estimated Blood Loss:     Estimated blood loss was minimal. Procedure:                Pre-Anesthesia Assessment:                           - Prior to the procedure, a History and Physical                            was performed, and patient medications and                            allergies were reviewed. The patient's tolerance of                            previous anesthesia was also reviewed. The risks                            and benefits of the procedure and the sedation                            options and risks were discussed with the patient.                            All questions were answered, and informed consent                            was obtained. Prior Anticoagulants: The patient has                            taken Plavix (clopidogrel), last dose was 7 days                            prior to procedure. ASA Grade Assessment: III - A                            patient with severe systemic disease. After  reviewing the risks and benefits, the patient was                            deemed in satisfactory condition to undergo the                            procedure.                           After obtaining informed consent, the endoscope was                            passed under direct vision. Throughout the                             procedure, the patient's blood pressure, pulse, and                            oxygen saturations were monitored continuously. The                            [id number]-H190 ([id number]) Olympus endoscope was introduced                            through the mouth, and advanced to the second part                            of duodenum. The upper GI endoscopy was                            accomplished without difficulty. The patient                            tolerated the procedure well. Scope In: Scope Out: Findings:      There is no endoscopic evidence of nodules in the entire esophagus.      The Z-line was regular and was found 35 cm from the incisors.      A small hiatal hernia was present.      Scattered mild inflammation was found in the entire examined stomach.      A few small sessile polyps with no stigmata of recent bleeding were       found in the gastric body. Biopsies were taken with a cold forceps for       histology.      The cardia and gastric fundus were normal on retroflexion.      The duodenal bulb, first portion of the duodenum and second portion of       the duodenum were normal. Impression:               - Z-line regular, 35 cm from the incisors.                           - Small hiatal hernia.                           - Chronic gastritis.                           -  A few gastric polyps. Biopsied.                           - Normal duodenal bulb, first portion of the                            duodenum and second portion of the duodenum. Moderate Sedation:      Moderate (conscious) sedation was personally administered by an       anesthesia professional. The following parameters were monitored: oxygen       saturation, heart rate, blood pressure, and response to care. Recommendation:           - Patient has a contact number available for                            emergencies. The signs and symptoms of potential                            delayed complications  were discussed with the                            patient. Return to normal activities tomorrow.                            Written discharge instructions were provided to the                            patient.                           - Resume previous diet.                           - Continue present medications.                           - Await pathology results. Procedure Code(s):        --- Professional ---                           [id number], Esophagogastroduodenoscopy, flexible,                            transoral; with biopsy, single or multiple Diagnosis Code(s):        --- Professional ---                           K44.9, Diaphragmatic hernia without obstruction or                            gangrene                           K29.50, Unspecified chronic gastritis without                            bleeding  K31.7, Polyp of stomach and duodenum                           D13.0, Benign neoplasm of esophagus CPT copyright 2022 American Medical Association. All rights reserved. The codes documented in this report are preliminary and upon coder review may  be revised to meet current compliance requirements. Otis Brace, MD Otis Brace, MD 03/26/2022 9:20:46 AM Number of Addenda: 0

## 2022-03-26 NOTE — Discharge Instructions (Signed)

## 2022-03-26 NOTE — Transfer of Care (Signed)
Immediate Anesthesia Transfer of Care Note  Patient: Roger [name]  Procedure(s) Performed: ESOPHAGOGASTRODUODENOSCOPY (EGD) WITH PROPOFOL  Patient Location: PACU and Endoscopy Unit  Anesthesia Type:MAC  Level of Consciousness: drowsy  Airway & Oxygen Therapy: Patient Spontanous Breathing and Patient connected to face mask oxygen  Post-op Assessment: Report given to RN and Post -op Vital signs reviewed and stable  Post vital signs: Reviewed and stable  Last Vitals:  Vitals Value Taken Time  BP 125/40 03/26/22 0910  Temp    Pulse 76 03/26/22 0914  Resp 34 03/26/22 0914  SpO2 100 % 03/26/22 0914  Vitals shown include unvalidated device data.  Last Pain:  Vitals:   03/26/22 0809  TempSrc: Tympanic  PainSc: 0-No pain         Complications: No notable events documented.

## 2022-03-26 NOTE — Anesthesia Preprocedure Evaluation (Addendum)
Anesthesia Evaluation  Patient identified by MRN, date of birth, ID band Patient awake    Reviewed: Allergy & Precautions, NPO status , Patient's Chart, lab work & pertinent test results, reviewed documented beta blocker date and time   Airway Mallampati: II  TM Distance: >3 FB Neck ROM: Full    Dental  (+) Edentulous Upper,    Pulmonary former smoker   Pulmonary exam normal breath sounds clear to auscultation       Cardiovascular hypertension, Pt. on home beta blockers and Pt. on medications + angina  + CAD, + Past MI, + Cardiac Stents and + Peripheral Vascular Disease  Normal cardiovascular exam+ dysrhythmias Atrial Fibrillation  Rhythm:Regular Rate:Normal  TTE 2023 1. Left ventricular ejection fraction, by estimation, is 55 to 60%. The  left ventricle has normal function. The left ventricle has no regional  wall motion abnormalities. Left ventricular diastolic parameters are  consistent with Grade II diastolic  dysfunction (pseudonormalization).   2. Right ventricular systolic function is normal. The right ventricular  size is normal. Tricuspid regurgitation signal is inadequate for assessing  PA pressure.   3. Left atrial size was moderately dilated.   4. The mitral valve is normal in structure. There is mild calcification  of the mitral valve leaflet(s). Mild mitral annular calcification.      Moderate mitral valve regurgitation. No evidence of mitral stenosis   5. The aortic valve is normal in structure. There is moderate  calcification of the aortic valve. Aortic valve regurgitation is mild.  Aortic valve sclerosis/calcification is present, without any evidence of  aortic stenosis.   6. The inferior vena cava is normal in size with greater than 50%  respiratory variability, suggesting right atrial pressure of 3 mmHg.   Cath 2023 1. Severe two-vessel coronary artery disease with chronic total occlusions of the proximal  LCx and RCA, as seen on prior catheterizations in 2018.  Coronary arteries are quite tortuous and heavily calcified. 2. Moderate mid LMCA and mid LAD disease with up to 50% stenosis. 3. Moderate-severe mid ramus intermedius disease with 60-70% narrowing. 4. Widely patent mid LAD stent. 5. Heavily calcified right iliac and common femoral arteries.  There is at least 50-70% stenosis of the right common femoral artery.   Recommendations: 1. Continue medical therapy; I will add metoprolol tartrate 12.5 mg twice daily (to be skipped on morning of dialysis). 2. Aggressive secondary prevention of coronary artery disease, including high-intensity statin therapy and long-term clopidogrel therapy.     Neuro/Psych  Headaches PSYCHIATRIC DISORDERS  Depression       GI/Hepatic Neg liver ROS,GERD  ,,  Endo/Other  negative endocrine ROS    Renal/GU ESRF and DialysisRenal disease (dialysis MWF, completed dialysis yesterday)  negative genitourinary   Musculoskeletal negative musculoskeletal ROS (+)    Abdominal   Peds  Hematology  (+) Blood dyscrasia (plavix)   Anesthesia Other Findings   Reproductive/Obstetrics                             Anesthesia Physical Anesthesia Plan  ASA: 3  Anesthesia Plan: MAC   Post-op Pain Management:    Induction: Intravenous  PONV Risk Score and Plan: Propofol infusion and Treatment may vary due to age or medical condition  Airway Management Planned: Natural Airway  Additional Equipment:   Intra-op Plan:   Post-operative Plan:   Informed Consent: I have reviewed the patients History and Physical, chart, labs and discussed the  procedure including the risks, benefits and alternatives for the proposed anesthesia with the patient or authorized representative who has indicated his/her understanding and acceptance.     Dental advisory given  Plan Discussed with: CRNA  Anesthesia Plan Comments:        Anesthesia  Quick Evaluation

## 2022-03-27 DIAGNOSIS — Z992 Dependence on renal dialysis: Secondary | ICD-10-CM | POA: Diagnosis not present

## 2022-03-27 DIAGNOSIS — N186 End stage renal disease: Secondary | ICD-10-CM | POA: Diagnosis not present

## 2022-03-27 DIAGNOSIS — D631 Anemia in chronic kidney disease: Secondary | ICD-10-CM | POA: Diagnosis not present

## 2022-03-27 DIAGNOSIS — N2581 Secondary hyperparathyroidism of renal origin: Secondary | ICD-10-CM | POA: Diagnosis not present

## 2022-03-27 DIAGNOSIS — D689 Coagulation defect, unspecified: Secondary | ICD-10-CM | POA: Diagnosis not present

## 2022-03-27 DIAGNOSIS — R52 Pain, unspecified: Secondary | ICD-10-CM | POA: Diagnosis not present

## 2022-03-27 LAB — SURGICAL PATHOLOGY

## 2022-03-28 ENCOUNTER — Encounter (HOSPITAL_COMMUNITY): Payer: Self-pay | Admitting: Gastroenterology

## 2022-03-28 ENCOUNTER — Encounter (HOSPITAL_COMMUNITY): Payer: Self-pay | Admitting: *Deleted

## 2022-03-29 DIAGNOSIS — D631 Anemia in chronic kidney disease: Secondary | ICD-10-CM | POA: Diagnosis not present

## 2022-03-29 DIAGNOSIS — N2581 Secondary hyperparathyroidism of renal origin: Secondary | ICD-10-CM | POA: Diagnosis not present

## 2022-03-29 DIAGNOSIS — Z992 Dependence on renal dialysis: Secondary | ICD-10-CM | POA: Diagnosis not present

## 2022-03-29 DIAGNOSIS — R52 Pain, unspecified: Secondary | ICD-10-CM | POA: Diagnosis not present

## 2022-03-29 DIAGNOSIS — D689 Coagulation defect, unspecified: Secondary | ICD-10-CM | POA: Diagnosis not present

## 2022-03-29 DIAGNOSIS — N186 End stage renal disease: Secondary | ICD-10-CM | POA: Diagnosis not present

## 2022-04-01 DIAGNOSIS — N186 End stage renal disease: Secondary | ICD-10-CM | POA: Diagnosis not present

## 2022-04-01 DIAGNOSIS — D689 Coagulation defect, unspecified: Secondary | ICD-10-CM | POA: Diagnosis not present

## 2022-04-01 DIAGNOSIS — R52 Pain, unspecified: Secondary | ICD-10-CM | POA: Diagnosis not present

## 2022-04-01 DIAGNOSIS — N2581 Secondary hyperparathyroidism of renal origin: Secondary | ICD-10-CM | POA: Diagnosis not present

## 2022-04-01 DIAGNOSIS — Z992 Dependence on renal dialysis: Secondary | ICD-10-CM | POA: Diagnosis not present

## 2022-04-01 DIAGNOSIS — D631 Anemia in chronic kidney disease: Secondary | ICD-10-CM | POA: Diagnosis not present

## 2022-04-03 DIAGNOSIS — N2581 Secondary hyperparathyroidism of renal origin: Secondary | ICD-10-CM | POA: Diagnosis not present

## 2022-04-03 DIAGNOSIS — R52 Pain, unspecified: Secondary | ICD-10-CM | POA: Diagnosis not present

## 2022-04-03 DIAGNOSIS — Z992 Dependence on renal dialysis: Secondary | ICD-10-CM | POA: Diagnosis not present

## 2022-04-03 DIAGNOSIS — N186 End stage renal disease: Secondary | ICD-10-CM | POA: Diagnosis not present

## 2022-04-03 DIAGNOSIS — D689 Coagulation defect, unspecified: Secondary | ICD-10-CM | POA: Diagnosis not present

## 2022-04-03 DIAGNOSIS — D631 Anemia in chronic kidney disease: Secondary | ICD-10-CM | POA: Diagnosis not present

## 2022-04-05 DIAGNOSIS — N186 End stage renal disease: Secondary | ICD-10-CM | POA: Diagnosis not present

## 2022-04-05 DIAGNOSIS — N2581 Secondary hyperparathyroidism of renal origin: Secondary | ICD-10-CM | POA: Diagnosis not present

## 2022-04-05 DIAGNOSIS — D631 Anemia in chronic kidney disease: Secondary | ICD-10-CM | POA: Diagnosis not present

## 2022-04-05 DIAGNOSIS — Z992 Dependence on renal dialysis: Secondary | ICD-10-CM | POA: Diagnosis not present

## 2022-04-05 DIAGNOSIS — D689 Coagulation defect, unspecified: Secondary | ICD-10-CM | POA: Diagnosis not present

## 2022-04-05 DIAGNOSIS — R52 Pain, unspecified: Secondary | ICD-10-CM | POA: Diagnosis not present

## 2022-04-08 DIAGNOSIS — D689 Coagulation defect, unspecified: Secondary | ICD-10-CM | POA: Diagnosis not present

## 2022-04-08 DIAGNOSIS — N2581 Secondary hyperparathyroidism of renal origin: Secondary | ICD-10-CM | POA: Diagnosis not present

## 2022-04-08 DIAGNOSIS — R52 Pain, unspecified: Secondary | ICD-10-CM | POA: Diagnosis not present

## 2022-04-08 DIAGNOSIS — N186 End stage renal disease: Secondary | ICD-10-CM | POA: Diagnosis not present

## 2022-04-08 DIAGNOSIS — Z992 Dependence on renal dialysis: Secondary | ICD-10-CM | POA: Diagnosis not present

## 2022-04-08 DIAGNOSIS — D631 Anemia in chronic kidney disease: Secondary | ICD-10-CM | POA: Diagnosis not present

## 2022-04-10 DIAGNOSIS — N2581 Secondary hyperparathyroidism of renal origin: Secondary | ICD-10-CM | POA: Diagnosis not present

## 2022-04-10 DIAGNOSIS — D689 Coagulation defect, unspecified: Secondary | ICD-10-CM | POA: Diagnosis not present

## 2022-04-10 DIAGNOSIS — D631 Anemia in chronic kidney disease: Secondary | ICD-10-CM | POA: Diagnosis not present

## 2022-04-10 DIAGNOSIS — R52 Pain, unspecified: Secondary | ICD-10-CM | POA: Diagnosis not present

## 2022-04-10 DIAGNOSIS — Z992 Dependence on renal dialysis: Secondary | ICD-10-CM | POA: Diagnosis not present

## 2022-04-10 DIAGNOSIS — N186 End stage renal disease: Secondary | ICD-10-CM | POA: Diagnosis not present

## 2022-04-12 DIAGNOSIS — D631 Anemia in chronic kidney disease: Secondary | ICD-10-CM | POA: Diagnosis not present

## 2022-04-12 DIAGNOSIS — R52 Pain, unspecified: Secondary | ICD-10-CM | POA: Diagnosis not present

## 2022-04-12 DIAGNOSIS — N186 End stage renal disease: Secondary | ICD-10-CM | POA: Diagnosis not present

## 2022-04-12 DIAGNOSIS — D689 Coagulation defect, unspecified: Secondary | ICD-10-CM | POA: Diagnosis not present

## 2022-04-12 DIAGNOSIS — Z992 Dependence on renal dialysis: Secondary | ICD-10-CM | POA: Diagnosis not present

## 2022-04-12 DIAGNOSIS — N2581 Secondary hyperparathyroidism of renal origin: Secondary | ICD-10-CM | POA: Diagnosis not present

## 2022-04-15 DIAGNOSIS — D689 Coagulation defect, unspecified: Secondary | ICD-10-CM | POA: Diagnosis not present

## 2022-04-15 DIAGNOSIS — N186 End stage renal disease: Secondary | ICD-10-CM | POA: Diagnosis not present

## 2022-04-15 DIAGNOSIS — R52 Pain, unspecified: Secondary | ICD-10-CM | POA: Diagnosis not present

## 2022-04-15 DIAGNOSIS — N2581 Secondary hyperparathyroidism of renal origin: Secondary | ICD-10-CM | POA: Diagnosis not present

## 2022-04-15 DIAGNOSIS — D631 Anemia in chronic kidney disease: Secondary | ICD-10-CM | POA: Diagnosis not present

## 2022-04-15 DIAGNOSIS — Z992 Dependence on renal dialysis: Secondary | ICD-10-CM | POA: Diagnosis not present

## 2022-04-17 DIAGNOSIS — I129 Hypertensive chronic kidney disease with stage 1 through stage 4 chronic kidney disease, or unspecified chronic kidney disease: Secondary | ICD-10-CM | POA: Diagnosis not present

## 2022-04-17 DIAGNOSIS — Z992 Dependence on renal dialysis: Secondary | ICD-10-CM | POA: Diagnosis not present

## 2022-04-17 DIAGNOSIS — N186 End stage renal disease: Secondary | ICD-10-CM | POA: Diagnosis not present

## 2022-04-17 DIAGNOSIS — D689 Coagulation defect, unspecified: Secondary | ICD-10-CM | POA: Diagnosis not present

## 2022-04-17 DIAGNOSIS — R52 Pain, unspecified: Secondary | ICD-10-CM | POA: Diagnosis not present

## 2022-04-17 DIAGNOSIS — D631 Anemia in chronic kidney disease: Secondary | ICD-10-CM | POA: Diagnosis not present

## 2022-04-17 DIAGNOSIS — N2581 Secondary hyperparathyroidism of renal origin: Secondary | ICD-10-CM | POA: Diagnosis not present

## 2022-04-19 DIAGNOSIS — Z23 Encounter for immunization: Secondary | ICD-10-CM | POA: Diagnosis not present

## 2022-04-19 DIAGNOSIS — D631 Anemia in chronic kidney disease: Secondary | ICD-10-CM | POA: Diagnosis not present

## 2022-04-19 DIAGNOSIS — Z992 Dependence on renal dialysis: Secondary | ICD-10-CM | POA: Diagnosis not present

## 2022-04-19 DIAGNOSIS — D689 Coagulation defect, unspecified: Secondary | ICD-10-CM | POA: Diagnosis not present

## 2022-04-19 DIAGNOSIS — N2581 Secondary hyperparathyroidism of renal origin: Secondary | ICD-10-CM | POA: Diagnosis not present

## 2022-04-19 DIAGNOSIS — N186 End stage renal disease: Secondary | ICD-10-CM | POA: Diagnosis not present

## 2022-04-22 DIAGNOSIS — Z992 Dependence on renal dialysis: Secondary | ICD-10-CM | POA: Diagnosis not present

## 2022-04-22 DIAGNOSIS — Z23 Encounter for immunization: Secondary | ICD-10-CM | POA: Diagnosis not present

## 2022-04-22 DIAGNOSIS — N2581 Secondary hyperparathyroidism of renal origin: Secondary | ICD-10-CM | POA: Diagnosis not present

## 2022-04-22 DIAGNOSIS — N186 End stage renal disease: Secondary | ICD-10-CM | POA: Diagnosis not present

## 2022-04-22 DIAGNOSIS — D631 Anemia in chronic kidney disease: Secondary | ICD-10-CM | POA: Diagnosis not present

## 2022-04-22 DIAGNOSIS — D689 Coagulation defect, unspecified: Secondary | ICD-10-CM | POA: Diagnosis not present

## 2022-04-24 DIAGNOSIS — D631 Anemia in chronic kidney disease: Secondary | ICD-10-CM | POA: Diagnosis not present

## 2022-04-24 DIAGNOSIS — D689 Coagulation defect, unspecified: Secondary | ICD-10-CM | POA: Diagnosis not present

## 2022-04-24 DIAGNOSIS — N2581 Secondary hyperparathyroidism of renal origin: Secondary | ICD-10-CM | POA: Diagnosis not present

## 2022-04-24 DIAGNOSIS — N186 End stage renal disease: Secondary | ICD-10-CM | POA: Diagnosis not present

## 2022-04-24 DIAGNOSIS — Z992 Dependence on renal dialysis: Secondary | ICD-10-CM | POA: Diagnosis not present

## 2022-04-24 DIAGNOSIS — Z23 Encounter for immunization: Secondary | ICD-10-CM | POA: Diagnosis not present

## 2022-04-26 DIAGNOSIS — Z23 Encounter for immunization: Secondary | ICD-10-CM | POA: Diagnosis not present

## 2022-04-26 DIAGNOSIS — N2581 Secondary hyperparathyroidism of renal origin: Secondary | ICD-10-CM | POA: Diagnosis not present

## 2022-04-26 DIAGNOSIS — N186 End stage renal disease: Secondary | ICD-10-CM | POA: Diagnosis not present

## 2022-04-26 DIAGNOSIS — D689 Coagulation defect, unspecified: Secondary | ICD-10-CM | POA: Diagnosis not present

## 2022-04-26 DIAGNOSIS — Z992 Dependence on renal dialysis: Secondary | ICD-10-CM | POA: Diagnosis not present

## 2022-04-26 DIAGNOSIS — D631 Anemia in chronic kidney disease: Secondary | ICD-10-CM | POA: Diagnosis not present

## 2022-04-29 DIAGNOSIS — Z992 Dependence on renal dialysis: Secondary | ICD-10-CM | POA: Diagnosis not present

## 2022-04-29 DIAGNOSIS — N186 End stage renal disease: Secondary | ICD-10-CM | POA: Diagnosis not present

## 2022-04-29 DIAGNOSIS — N2581 Secondary hyperparathyroidism of renal origin: Secondary | ICD-10-CM | POA: Diagnosis not present

## 2022-04-29 DIAGNOSIS — D631 Anemia in chronic kidney disease: Secondary | ICD-10-CM | POA: Diagnosis not present

## 2022-04-29 DIAGNOSIS — Z23 Encounter for immunization: Secondary | ICD-10-CM | POA: Diagnosis not present

## 2022-04-29 DIAGNOSIS — D689 Coagulation defect, unspecified: Secondary | ICD-10-CM | POA: Diagnosis not present

## 2022-05-01 DIAGNOSIS — N186 End stage renal disease: Secondary | ICD-10-CM | POA: Diagnosis not present

## 2022-05-01 DIAGNOSIS — Z992 Dependence on renal dialysis: Secondary | ICD-10-CM | POA: Diagnosis not present

## 2022-05-01 DIAGNOSIS — D689 Coagulation defect, unspecified: Secondary | ICD-10-CM | POA: Diagnosis not present

## 2022-05-01 DIAGNOSIS — D631 Anemia in chronic kidney disease: Secondary | ICD-10-CM | POA: Diagnosis not present

## 2022-05-01 DIAGNOSIS — Z23 Encounter for immunization: Secondary | ICD-10-CM | POA: Diagnosis not present

## 2022-05-01 DIAGNOSIS — N2581 Secondary hyperparathyroidism of renal origin: Secondary | ICD-10-CM | POA: Diagnosis not present

## 2022-05-03 DIAGNOSIS — D631 Anemia in chronic kidney disease: Secondary | ICD-10-CM | POA: Diagnosis not present

## 2022-05-03 DIAGNOSIS — N186 End stage renal disease: Secondary | ICD-10-CM | POA: Diagnosis not present

## 2022-05-03 DIAGNOSIS — N2581 Secondary hyperparathyroidism of renal origin: Secondary | ICD-10-CM | POA: Diagnosis not present

## 2022-05-03 DIAGNOSIS — Z23 Encounter for immunization: Secondary | ICD-10-CM | POA: Diagnosis not present

## 2022-05-03 DIAGNOSIS — Z992 Dependence on renal dialysis: Secondary | ICD-10-CM | POA: Diagnosis not present

## 2022-05-03 DIAGNOSIS — D689 Coagulation defect, unspecified: Secondary | ICD-10-CM | POA: Diagnosis not present

## 2022-05-06 DIAGNOSIS — Z992 Dependence on renal dialysis: Secondary | ICD-10-CM | POA: Diagnosis not present

## 2022-05-06 DIAGNOSIS — N2581 Secondary hyperparathyroidism of renal origin: Secondary | ICD-10-CM | POA: Diagnosis not present

## 2022-05-06 DIAGNOSIS — Z23 Encounter for immunization: Secondary | ICD-10-CM | POA: Diagnosis not present

## 2022-05-06 DIAGNOSIS — D631 Anemia in chronic kidney disease: Secondary | ICD-10-CM | POA: Diagnosis not present

## 2022-05-06 DIAGNOSIS — D689 Coagulation defect, unspecified: Secondary | ICD-10-CM | POA: Diagnosis not present

## 2022-05-06 DIAGNOSIS — N186 End stage renal disease: Secondary | ICD-10-CM | POA: Diagnosis not present

## 2022-05-08 DIAGNOSIS — Z992 Dependence on renal dialysis: Secondary | ICD-10-CM | POA: Diagnosis not present

## 2022-05-08 DIAGNOSIS — Z23 Encounter for immunization: Secondary | ICD-10-CM | POA: Diagnosis not present

## 2022-05-08 DIAGNOSIS — D631 Anemia in chronic kidney disease: Secondary | ICD-10-CM | POA: Diagnosis not present

## 2022-05-08 DIAGNOSIS — D689 Coagulation defect, unspecified: Secondary | ICD-10-CM | POA: Diagnosis not present

## 2022-05-08 DIAGNOSIS — N2581 Secondary hyperparathyroidism of renal origin: Secondary | ICD-10-CM | POA: Diagnosis not present

## 2022-05-08 DIAGNOSIS — N186 End stage renal disease: Secondary | ICD-10-CM | POA: Diagnosis not present

## 2022-05-10 DIAGNOSIS — Z23 Encounter for immunization: Secondary | ICD-10-CM | POA: Diagnosis not present

## 2022-05-10 DIAGNOSIS — D631 Anemia in chronic kidney disease: Secondary | ICD-10-CM | POA: Diagnosis not present

## 2022-05-10 DIAGNOSIS — N2581 Secondary hyperparathyroidism of renal origin: Secondary | ICD-10-CM | POA: Diagnosis not present

## 2022-05-10 DIAGNOSIS — Z992 Dependence on renal dialysis: Secondary | ICD-10-CM | POA: Diagnosis not present

## 2022-05-10 DIAGNOSIS — N186 End stage renal disease: Secondary | ICD-10-CM | POA: Diagnosis not present

## 2022-05-10 DIAGNOSIS — D689 Coagulation defect, unspecified: Secondary | ICD-10-CM | POA: Diagnosis not present

## 2022-05-13 DIAGNOSIS — N186 End stage renal disease: Secondary | ICD-10-CM | POA: Diagnosis not present

## 2022-05-13 DIAGNOSIS — Z23 Encounter for immunization: Secondary | ICD-10-CM | POA: Diagnosis not present

## 2022-05-13 DIAGNOSIS — D631 Anemia in chronic kidney disease: Secondary | ICD-10-CM | POA: Diagnosis not present

## 2022-05-13 DIAGNOSIS — Z992 Dependence on renal dialysis: Secondary | ICD-10-CM | POA: Diagnosis not present

## 2022-05-13 DIAGNOSIS — D689 Coagulation defect, unspecified: Secondary | ICD-10-CM | POA: Diagnosis not present

## 2022-05-13 DIAGNOSIS — N2581 Secondary hyperparathyroidism of renal origin: Secondary | ICD-10-CM | POA: Diagnosis not present

## 2022-05-14 ENCOUNTER — Ambulatory Visit: Payer: 59 | Admitting: Neurology

## 2022-05-15 DIAGNOSIS — N186 End stage renal disease: Secondary | ICD-10-CM | POA: Diagnosis not present

## 2022-05-15 DIAGNOSIS — Z992 Dependence on renal dialysis: Secondary | ICD-10-CM | POA: Diagnosis not present

## 2022-05-15 DIAGNOSIS — D689 Coagulation defect, unspecified: Secondary | ICD-10-CM | POA: Diagnosis not present

## 2022-05-15 DIAGNOSIS — Z23 Encounter for immunization: Secondary | ICD-10-CM | POA: Diagnosis not present

## 2022-05-15 DIAGNOSIS — N2581 Secondary hyperparathyroidism of renal origin: Secondary | ICD-10-CM | POA: Diagnosis not present

## 2022-05-15 DIAGNOSIS — D631 Anemia in chronic kidney disease: Secondary | ICD-10-CM | POA: Diagnosis not present

## 2022-05-16 DIAGNOSIS — N186 End stage renal disease: Secondary | ICD-10-CM | POA: Diagnosis not present

## 2022-05-16 DIAGNOSIS — I129 Hypertensive chronic kidney disease with stage 1 through stage 4 chronic kidney disease, or unspecified chronic kidney disease: Secondary | ICD-10-CM | POA: Diagnosis not present

## 2022-05-16 DIAGNOSIS — Z992 Dependence on renal dialysis: Secondary | ICD-10-CM | POA: Diagnosis not present

## 2022-05-17 DIAGNOSIS — N2581 Secondary hyperparathyroidism of renal origin: Secondary | ICD-10-CM | POA: Diagnosis not present

## 2022-05-17 DIAGNOSIS — N186 End stage renal disease: Secondary | ICD-10-CM | POA: Diagnosis not present

## 2022-05-17 DIAGNOSIS — D689 Coagulation defect, unspecified: Secondary | ICD-10-CM | POA: Diagnosis not present

## 2022-05-17 DIAGNOSIS — Z23 Encounter for immunization: Secondary | ICD-10-CM | POA: Diagnosis not present

## 2022-05-17 DIAGNOSIS — D631 Anemia in chronic kidney disease: Secondary | ICD-10-CM | POA: Diagnosis not present

## 2022-05-17 DIAGNOSIS — Z992 Dependence on renal dialysis: Secondary | ICD-10-CM | POA: Diagnosis not present

## 2022-05-20 DIAGNOSIS — Z23 Encounter for immunization: Secondary | ICD-10-CM | POA: Diagnosis not present

## 2022-05-20 DIAGNOSIS — N2581 Secondary hyperparathyroidism of renal origin: Secondary | ICD-10-CM | POA: Diagnosis not present

## 2022-05-20 DIAGNOSIS — D689 Coagulation defect, unspecified: Secondary | ICD-10-CM | POA: Diagnosis not present

## 2022-05-20 DIAGNOSIS — D631 Anemia in chronic kidney disease: Secondary | ICD-10-CM | POA: Diagnosis not present

## 2022-05-20 DIAGNOSIS — N186 End stage renal disease: Secondary | ICD-10-CM | POA: Diagnosis not present

## 2022-05-20 DIAGNOSIS — Z992 Dependence on renal dialysis: Secondary | ICD-10-CM | POA: Diagnosis not present

## 2022-05-21 ENCOUNTER — Ambulatory Visit (INDEPENDENT_AMBULATORY_CARE_PROVIDER_SITE_OTHER): Payer: 59 | Admitting: Neurology

## 2022-05-21 ENCOUNTER — Encounter: Payer: Self-pay | Admitting: Neurology

## 2022-05-21 ENCOUNTER — Telehealth: Payer: Self-pay | Admitting: Neurology

## 2022-05-21 VITALS — BP 110/41 | HR 59 | Ht 66.0 in | Wt 165.6 lb

## 2022-05-21 DIAGNOSIS — I779 Disorder of arteries and arterioles, unspecified: Secondary | ICD-10-CM | POA: Diagnosis not present

## 2022-05-21 DIAGNOSIS — Z87891 Personal history of nicotine dependence: Secondary | ICD-10-CM

## 2022-05-21 DIAGNOSIS — R351 Nocturia: Secondary | ICD-10-CM

## 2022-05-21 DIAGNOSIS — G479 Sleep disorder, unspecified: Secondary | ICD-10-CM

## 2022-05-21 DIAGNOSIS — R413 Other amnesia: Secondary | ICD-10-CM | POA: Diagnosis not present

## 2022-05-21 DIAGNOSIS — N186 End stage renal disease: Secondary | ICD-10-CM

## 2022-05-21 DIAGNOSIS — R790 Abnormal level of blood mineral: Secondary | ICD-10-CM | POA: Diagnosis not present

## 2022-05-21 DIAGNOSIS — Z955 Presence of coronary angioplasty implant and graft: Secondary | ICD-10-CM | POA: Diagnosis not present

## 2022-05-21 DIAGNOSIS — Z9189 Other specified personal risk factors, not elsewhere classified: Secondary | ICD-10-CM | POA: Diagnosis not present

## 2022-05-21 NOTE — Telephone Encounter (Signed)
[employer] medicare NPR sent to GI [phone]

## 2022-05-21 NOTE — Patient Instructions (Addendum)
You have complaints of memory loss: memory loss or changes in cognitive function can have many reasons and does not always mean you have dementia. Conditions that can contribute to subjective or objective memory loss include: depression, stress, poor sleep from insomnia or sleep apnea, dehydration, fluctuation in blood sugar values, thyroid or electrolyte dysfunction and certain vitamin deficiencies. Dementia can be caused by stroke, brain atherosclerosis or brain vascular disease due to vascular risk factors (smoking, high blood pressure, high cholesterol, obesity and uncontrolled diabetes), certain degenerative brain disorders (including Parkinson's disease and Multiple sclerosis) and by Alzheimer's disease or other, more rare and sometimes hereditary causes. We will do some additional testing:  Blood work (which has, in part, been done recently already by Dr. Dorthy Cooler).  We will do a brain scan, called MRI and call you with the test results. We will have to schedule you for this on a separate date. This test requires authorization from your insurance, and we will take care of the insurance process. 3. Sleep study - we will pursue a home sleep test to look into the possibility of obstructive sleep apnea.  If you have obstructive sleep apnea, I would likely recommend a machine called AutoPap therapy for treatment.  We will plan a follow-up after testing and we will keep you posted as to your test results by phone call, we will likely call your daughter [name].

## 2022-05-21 NOTE — Progress Notes (Signed)
Subjective:    Patient ID: Roger [name] is a 82 y.o. male.  HPI    Star Age, MD, PhD [facility] [address] [place], [place] [address]  Dr. Dorthy Cooler,  I saw your patient, Roger [name], upon your kind request in my neurologic clinic today for initial consultation of his memory loss.  The patient is accompanied by his daughter, Roger [name], today.  As you know, [name] is a 82 year old male with an underlying complex medical history of coronary artery disease with a history of non-STEMI, s/p stent placement, in [id number], diastolic dysfunction, hyperlipidemia, hypertension, paroxysmal A-fib, history of prostate cancer, prior smoking, anemia, arthritis, depression, end-stage renal disease on dialysis, reflux disease, history of gastrointestinal bleeding, gout, history of kidney stones, carotid artery disease, and mildly overweight state, who reports some forgetfulness but is not very elaborate in his history.  He estimates that he has had forgetfulness for about 2 years.  His daughter reports that he has a tendency to repeat himself and his short-term memory has become progressively worse in the past 2 to 3 years.  He is widowed, his wife passed away in June 13, 1999.  He has 4 children but none live close by, his son is the closest and lives in [place].  His daughter lives about an hour and a half away, he has 2 other daughters in [place].  He does not drive and has not driven since approximately 06-12-01, daughter reports that he became really anxious while driving so they asked him to stop.  I reviewed your office note from 03/12/2022.  He had blood work through your office at the time and I reviewed the results.  TSH was 1.52, CMP showed glucose of 89, BUN 38, creatinine 9.31, sodium 140, potassium 5.0, calcium borderline elevated at 10.4, alk phos 67, AST 21, ALT 11.  CBC with differential and platelets showed WBC of 7.4, hemoglobin below normal at 11.4, hematocrit  below normal at 34.6, MCV elevated at 99.  Platelets 162.  Lipid panel showed total cholesterol of 167, triglycerides elevated at 301, LDL 81.  He has been on dialysis since 06/12/2016.  They could not place an arm fistula and he has a port still.  He reports drinking 8 cups of coffee per day and daughter reports that he is limited in his water intake to 32 ounces per day.  He does not have a family history of dementia, mom lived to be 39 and died of cancer, dad lived to be 75 and died of a heart attack but also had cancer and other medical problems.  He lost his sister to breast cancer, she was younger and he has a younger brother age 59 who does not have memory loss.  He does not drink any alcohol.  He quit smoking in 06/12/16.  Of note, he does not sleep well.  He does snore, he does produce urine and reports nocturia about twice per average night.  He has never had a sleep study.  His Past Medical History Is Significant For: Past Medical History:  Diagnosis Date   Anemia    Anginal pain ([place])    Arthritis    "all over; bad in the legs" (12/04/2016)   CAD (coronary artery disease)    a. NSTEMI in setting of anemia; b. 10/2016 MV: reversible ant, apical, inflat defect; c. 10/2016 Cath: LM nl, LAD 40p, 18m D1 70, D2 80, RI min irregs, LCX 100ost, RCA 100p, 1019m, RPDA fills via  collats from LAD, RPAV small-->Initial conservative Rx in setting of GIB w/ plan for PCI LAD if H/H stable on ASA/Plavix;  d. 12/04/16 s/p PTCA and DES to mLAD; e. 09/2018 MV: EF 54%, no ischemia.   Carotid disease, bilateral ([place])    a. 04/2017 U/S: RICA 123XX123, RCCA [id number], LICA [id number].   Depression    situational    Diastolic dysfunction    a. 10/2016 Echo: EF 55-60%, no rwma, Gr2 DD, triv MR, mildly dil LA; b. 07/2017 Echo: EF 60-65%, Gr1 DD. No rwma. AoV leaflet thickening (not felt to be SBE upon further review). Mild MR. Mildly dil LA.   ESRD on dialysis [facility])    "[place]; [address]; TTS usually; going to [facility] in [place], [place]  right now while staying w/daughter" (12/04/2016)   GERD (gastroesophageal reflux disease)    GIB (gastrointestinal bleeding) 10/2011   a. 10/2016 3u PRBCs - EGD/Colonoscopy: angiodysplasia w/ diverticulosis. No apparent bleeding. Polypecotmy->tubular adenomas.   Gout    Headache    no migraines for years   Heart murmur    High cholesterol    History of blood transfusion 10/18/2016   "related to anemia" (12/04/2016)   History of kidney stones    years ago   Hypertension    Iron deficiency anemia    Myocardial infarction ([place]) 10/18/2016   Old MI (myocardial infarction)    "found evidence of this on 10/18/2016"   PAF (paroxysmal atrial fibrillation) ([place])    Peripheral vascular disease ([facility])    BLE   Prostate cancer ([place])    a. s/p seed implantation.   Tobacco abuse     His Past Surgical History Is Significant For: Past Surgical History:  Procedure Laterality Date   AV FISTULA PLACEMENT Right 03/28/2017   Procedure: ARTERIOVENOUS (AV) BRACHIOCEPHALIC FISTULA CREATION;  Surgeon: Angelia Mould, MD;  Location: [place];  Service: Vascular;  Laterality: Right;   BIOPSY  03/26/2022   Procedure: BIOPSY;  Surgeon: Otis Brace, MD;  Location: [facility];  Service: Gastroenterology;;   CARDIAC CATHETERIZATION  10/2016   CHOLECYSTECTOMY N/A 11/03/2019   Procedure: LAPAROSCOPIC CHOLECYSTECTOMY;  Surgeon: Clovis Riley, MD;  Location: [facility];  Service: General;  Laterality: N/A;   COLONOSCOPY WITH PROPOFOL N/A 10/21/2016   Procedure: COLONOSCOPY WITH PROPOFOL;  Surgeon: Gatha Mayer, MD;  Location: [facility];  Service: Endoscopy;  Laterality: N/A;   CORONARY ANGIOPLASTY WITH STENT PLACEMENT  12/04/2016   "1 stent"   CORONARY STENT INTERVENTION N/A 12/04/2016   Procedure: CORONARY STENT INTERVENTION;  Surgeon: Wellington Hampshire, MD;  Location: [facility];  Service: Cardiovascular;  Laterality: N/A;   ESOPHAGOGASTRODUODENOSCOPY N/A 10/21/2016   Procedure:  ESOPHAGOGASTRODUODENOSCOPY (EGD);  Surgeon: Gatha Mayer, MD;  Location: [facility];  Service: Endoscopy;  Laterality: N/A;   ESOPHAGOGASTRODUODENOSCOPY (EGD) WITH PROPOFOL N/A 03/26/2022   Procedure: ESOPHAGOGASTRODUODENOSCOPY (EGD) WITH PROPOFOL;  Surgeon: Otis Brace, MD;  Location: [facility];  Service: Gastroenterology;  Laterality: N/A;   INSERTION OF DIALYSIS CATHETER Right 10/22/2016   Procedure: INSERTION OF TUNNELED DIALYSIS CATHETER;  Surgeon: Elam Dutch, MD;  Location: [facility];  Service: Vascular;  Laterality: Right;   INTRAOPERATIVE CHOLANGIOGRAM N/A 11/03/2019   Procedure: ATTEMPTED INTRAOPERATIVE CHOLANGIOGRAM;  Surgeon: Clovis Riley, MD;  Location: [place];  Service: General;  Laterality: N/A;   LEFT HEART CATH AND CORONARY ANGIOGRAPHY N/A 10/23/2016   Procedure: LEFT HEART CATH AND CORONARY ANGIOGRAPHY;  Surgeon: Wellington Hampshire, MD;  Location: [facility];  Service: Cardiovascular;  Laterality: N/A;   LEFT HEART CATH AND CORONARY ANGIOGRAPHY Left 10/09/2021   Procedure: LEFT HEART CATH AND CORONARY ANGIOGRAPHY;  Surgeon: Nelva Bush, MD;  Location: [facility];  Service: Cardiovascular;  Laterality: Left;   LIGATION OF ARTERIOVENOUS  FISTULA Right 05/12/2017   Procedure: BANDING OF RIGHT UPPER ARM ARTERIOVENOUS  FISTULA USING 6MM X 10CM GORE TEX VASCULAR GRAFT;  Surgeon: Angelia Mould, MD;  Location: [place];  Service: Vascular;  Laterality: Right;   LIGATION OF ARTERIOVENOUS  FISTULA Right 07/03/2017   Procedure: LIGATION OF ARTERIOVENOUS  FISTULA RIGHT ARM;  Surgeon: Angelia Mould, MD;  Location: [facility];  Service: Vascular;  Laterality: Right;   TONSILLECTOMY      His Family History Is Significant For: Family History  Problem Relation Age of Onset   Cancer Mother    Cancer Father    Heart attack Father    Diabetes Father    Hypertension Father    Cancer Sister    Cancer Brother    Diabetes Brother    Hypertension  Brother    Hypertension Daughter    Alzheimer's disease Neg Hx    Dementia Neg Hx     His Social History Is Significant For: Social History   Socioeconomic History   Marital status: Widowed    Spouse name: Not on file   Number of children: Not on file   Years of education: Not on file   Highest education level: Not on file  Occupational History   Not on file  Tobacco Use   Smoking status: Former    Packs/day: 0.50    Years: 50.00    Total pack years: 25.00    Types: Cigarettes    Quit date: 10/18/2016    Years since quitting: 5.5   Smokeless tobacco: Never  Vaping Use   Vaping Use: Former  Substance and Sexual Activity   Alcohol use: No   Drug use: No   Sexual activity: Never    Birth control/protection: Abstinence  Other Topics Concern   Not on file  Social History Narrative   Not on file   Social Determinants of Health   Financial Resource Strain: Not on file  Food Insecurity: Not on file  Transportation Needs: Not on file  Physical Activity: Not on file  Stress: Not on file  Social Connections: Not on file    His Allergies Are:  No Known Allergies:   His Current Medications Are:  Outpatient Encounter Medications as of 05/21/2022  Medication Sig   acetaminophen (TYLENOL) 325 MG tablet Take 2 tablets (650 mg total) by mouth every 6 (six) hours as needed for mild pain.   allopurinol (ZYLOPRIM) 100 MG tablet Take 1 tablet (100 mg total) by mouth daily.   atorvastatin (LIPITOR) 80 MG tablet TAKE 1 TABLET(80 MG) BY MOUTH DAILY AT 6 PM   b complex vitamins tablet Take 1 tablet by mouth at bedtime.    calcitRIOL (ROCALTROL) 0.25 MCG capsule Take 1 capsule (0.25 mcg total) by mouth Every Tuesday,Thursday,and Saturday with dialysis. (Patient taking differently: Take 0.25 mcg by mouth every Monday, Wednesday, and Friday with hemodialysis. Patient takes on dialysis days at dialysis)   clopidogrel (PLAVIX) 75 MG tablet Take 1 tablet (75 mg total) by mouth daily.    ezetimibe (ZETIA) 10 MG tablet TAKE 1 TABLET(10 MG) BY MOUTH DAILY   ferric citrate (AURYXIA) 1 GM 210 MG(Fe) tablet Take 420 mg by mouth 2 (two)  times daily with a meal.   isosorbide mononitrate (IMDUR) 30 MG 24 hr tablet Take 1 tablet (30 mg) by mouth once daily in the evening   midodrine (PROAMATINE) 10 MG tablet TAKE 1 TABLET BY MOUTH TWICE DAILY AS NEEDED( TWICE DAILY ON HEMODIALYSIS DAYS) (Patient taking differently: Take 10 mg by mouth See admin instructions. Take Mon. Wed and Fri., Twice a day)   nitroGLYCERIN (NITROSTAT) 0.4 MG SL tablet Place 1 tablet (0.4 mg total) under the tongue every 5 (five) minutes as needed for chest pain.   pantoprazole (PROTONIX) 40 MG tablet Take 1 tablet (40 mg total) by mouth 2 (two) times daily.   tamsulosin (FLOMAX) 0.4 MG CAPS capsule Take 0.4 mg by mouth daily at 6 PM.   metoprolol tartrate (LOPRESSOR) 25 MG tablet Take 0.5 tablets (12.5 mg total) by mouth 2 (two) times daily. Do not take on morning of hemodialysis. (Patient not taking: Reported on 05/21/2022)   No facility-administered encounter medications on file as of 05/21/2022.  :   Review of Systems:  Out of a complete 14 point review of systems, all are reviewed and negative with the exception of these symptoms as listed below:   Review of Systems  Neurological:        Pt here for memory loss  Daughter states short term memory is worse. Daughter states he forgets conversations,ask same questions more than once.     Objective:  Neurological Exam  Physical Exam Physical Examination:   Vitals:   05/21/22 0807  BP: (!) 110/41  Pulse: (!) 59    General Examination: The patient is a very pleasant 82 y.o. male in no acute distress. He appears chronically ill, adequately groomed.   HEENT: Normocephalic, atraumatic, pupils are equal, round and reactive to light, extraocular tracking is good without limitation to gaze excursion or nystagmus noted. Hearing is grossly intact. Face is symmetric  with normal facial animation. Speech is clear with no dysarthria noted. There is no hypophonia. There is no lip, neck/head, jaw or voice tremor. Neck is supple with full range of passive and active motion. There are no carotid bruits on auscultation. Oropharynx exam reveals: moderate mouth dryness, nearly edentulous with 1 single tooth on the bottom right front.  Moderate airway crowding secondary to thicker tongue, Mallampati class IV, tip of uvula not fully visualized, tonsils absent.  Neck circumference 16 inches.  Tongue protrudes centrally and palate elevates symmetrically.  Chest: Clear to auscultation without wheezing, rhonchi or crackles noted.  Right upper chest port for dialysis.  Heart: S1+S2+0, regular and normal without murmurs, rubs or gallops noted.   Abdomen: Soft, non-tender and non-distended.  Extremities: There is no pitting edema in the distal lower extremities bilaterally.   Skin: Warm and dry without trophic changes noted.   Musculoskeletal: exam reveals no obvious joint deformities.   Neurologically:  Mental status: The patient is awake, alert and oriented in all 4 spheres. His immediate and remote memory, attention, language skills and fund of knowledge are mildly abnormal.  He is at times jocular about his history and memory.   There is no evidence of aphasia, agnosia, apraxia or anomia. Speech is clear with normal prosody and enunciation. Thought process is linear. [name] is normal and affect is normal.      05/21/2022    8:08 AM  MMSE - Mini Mental State Exam  Orientation to time 5  Orientation to Place 5  Registration 3  Attention/ Calculation 5  Recall 0  Language-  name 2 objects 2  Language- repeat 1  Language- follow 3 step command 3  Language- read & follow direction 1  Write a sentence 1  Copy design 1  Total score 27   On 05/21/2022: CDT: 2/4, AFT: 12/min.  Cranial nerves II - XII are as described above under HEENT exam.  Motor exam: Normal bulk,  strength and tone is noted. There is no obvious action or resting tremor.  Fine motor skills and coordination: grossly intact.  Cerebellar testing: No dysmetria or intention tremor. There is no truncal or gait ataxia.  Sensory exam: intact to light touch in the upper and lower extremities.  Gait, station and balance: He stands easily. No veering to one side is noted. No leaning to one side is noted. Posture is age-appropriate and stance is narrow based. Gait shows normal stride length and normal pace. No problems turning are noted.   Assessment and Plan:  In summary, DYSHAWN LELLI is a 82 year old male with an underlying complex medical history of coronary artery disease with a history of non-STEMI, s/p stent placement, in [id number], diastolic dysfunction, hyperlipidemia, hypertension, paroxysmal A-fib, history of prostate cancer, prior smoking, anemia, arthritis, depression, end-stage renal disease on dialysis, reflux disease, history of gastrointestinal bleeding, gout, history of kidney stones, carotid artery disease, and mildly overweight state, who presents for evaluation of his short-term memory loss of approximately 2 to 3 years duration.  His memory scores are mildly abnormal, he does have multiple vascular risk factors and as such is at risk for vascular dementia.  We talked about the importance of maintaining a healthy lifestyle, staying active mentally and physically, fall prevention.  I would like to proceed with further evaluation in the form of blood work in addition to what has been checked in December 2023.  I would like to pursue a brain MRI without contrast, given his kidney disease I would like to avoid contrast.  I would like to proceed with a home sleep test to evaluate for potential underlying obstructive sleep apnea as he could be at risk.  I did not suggest any new medications quite yet but we may utilize memory medication in the future.  I recommended that we reconvene after testing and we  will keep them posted as to his blood test results and MRI and sleep test results by phone call in the interim.  If he has obstructive sleep apnea I will likely recommend treatment for this in the form of AutoPap therapy.  He is advised to scale back on his coffee intake and limit himself to 1 cup of coffee per day and limit his water to 32 ounces as recommended by nephrology as I understand.  I answered all their questions today and the patient and his daughter were in agreement.  This was an extended visit of over 1 hour with extended chart review and counseling.  Thank you very much for allowing me to participate in the care of this nice patient. If I can be of any further assistance to you please do not hesitate to call me at [phone].  Sincerely,   Star Age, MD, PhD

## 2022-05-22 DIAGNOSIS — N186 End stage renal disease: Secondary | ICD-10-CM | POA: Diagnosis not present

## 2022-05-22 DIAGNOSIS — Z992 Dependence on renal dialysis: Secondary | ICD-10-CM | POA: Diagnosis not present

## 2022-05-22 DIAGNOSIS — D689 Coagulation defect, unspecified: Secondary | ICD-10-CM | POA: Diagnosis not present

## 2022-05-22 DIAGNOSIS — Z23 Encounter for immunization: Secondary | ICD-10-CM | POA: Diagnosis not present

## 2022-05-22 DIAGNOSIS — N2581 Secondary hyperparathyroidism of renal origin: Secondary | ICD-10-CM | POA: Diagnosis not present

## 2022-05-22 DIAGNOSIS — D631 Anemia in chronic kidney disease: Secondary | ICD-10-CM | POA: Diagnosis not present

## 2022-05-24 DIAGNOSIS — Z992 Dependence on renal dialysis: Secondary | ICD-10-CM | POA: Diagnosis not present

## 2022-05-24 DIAGNOSIS — D631 Anemia in chronic kidney disease: Secondary | ICD-10-CM | POA: Diagnosis not present

## 2022-05-24 DIAGNOSIS — N2581 Secondary hyperparathyroidism of renal origin: Secondary | ICD-10-CM | POA: Diagnosis not present

## 2022-05-24 DIAGNOSIS — Z23 Encounter for immunization: Secondary | ICD-10-CM | POA: Diagnosis not present

## 2022-05-24 DIAGNOSIS — D689 Coagulation defect, unspecified: Secondary | ICD-10-CM | POA: Diagnosis not present

## 2022-05-24 DIAGNOSIS — N186 End stage renal disease: Secondary | ICD-10-CM | POA: Diagnosis not present

## 2022-05-27 ENCOUNTER — Telehealth: Payer: Self-pay | Admitting: *Deleted

## 2022-05-27 DIAGNOSIS — D631 Anemia in chronic kidney disease: Secondary | ICD-10-CM | POA: Diagnosis not present

## 2022-05-27 DIAGNOSIS — D689 Coagulation defect, unspecified: Secondary | ICD-10-CM | POA: Diagnosis not present

## 2022-05-27 DIAGNOSIS — Z23 Encounter for immunization: Secondary | ICD-10-CM | POA: Diagnosis not present

## 2022-05-27 DIAGNOSIS — N2581 Secondary hyperparathyroidism of renal origin: Secondary | ICD-10-CM | POA: Diagnosis not present

## 2022-05-27 DIAGNOSIS — N186 End stage renal disease: Secondary | ICD-10-CM | POA: Diagnosis not present

## 2022-05-27 DIAGNOSIS — Z992 Dependence on renal dialysis: Secondary | ICD-10-CM | POA: Diagnosis not present

## 2022-05-27 NOTE — Telephone Encounter (Signed)
-----   Message from Star Age, MD sent at 05/27/2022  8:11 AM EDT ----- Labs were benign, please update patient's daughter on DPR.  1 test is pending which is the vitamin B6, it usually takes a few more days to come back.  We will update if it is abnormal with a call back.

## 2022-05-27 NOTE — Telephone Encounter (Signed)
I called pt and relayed the results of the labs to daughter, [name]. Benign results, only test B6 has not resulted yet but if abnormal will let you know.  She verbalized understanding.

## 2022-05-29 DIAGNOSIS — Z23 Encounter for immunization: Secondary | ICD-10-CM | POA: Diagnosis not present

## 2022-05-29 DIAGNOSIS — Z992 Dependence on renal dialysis: Secondary | ICD-10-CM | POA: Diagnosis not present

## 2022-05-29 DIAGNOSIS — N2581 Secondary hyperparathyroidism of renal origin: Secondary | ICD-10-CM | POA: Diagnosis not present

## 2022-05-29 DIAGNOSIS — D689 Coagulation defect, unspecified: Secondary | ICD-10-CM | POA: Diagnosis not present

## 2022-05-29 DIAGNOSIS — N186 End stage renal disease: Secondary | ICD-10-CM | POA: Diagnosis not present

## 2022-05-29 DIAGNOSIS — D631 Anemia in chronic kidney disease: Secondary | ICD-10-CM | POA: Diagnosis not present

## 2022-05-31 DIAGNOSIS — D631 Anemia in chronic kidney disease: Secondary | ICD-10-CM | POA: Diagnosis not present

## 2022-05-31 DIAGNOSIS — N2581 Secondary hyperparathyroidism of renal origin: Secondary | ICD-10-CM | POA: Diagnosis not present

## 2022-05-31 DIAGNOSIS — Z23 Encounter for immunization: Secondary | ICD-10-CM | POA: Diagnosis not present

## 2022-05-31 DIAGNOSIS — D689 Coagulation defect, unspecified: Secondary | ICD-10-CM | POA: Diagnosis not present

## 2022-05-31 DIAGNOSIS — N186 End stage renal disease: Secondary | ICD-10-CM | POA: Diagnosis not present

## 2022-05-31 DIAGNOSIS — Z992 Dependence on renal dialysis: Secondary | ICD-10-CM | POA: Diagnosis not present

## 2022-05-31 LAB — VITAMIN D 25 HYDROXY (VIT D DEFICIENCY, FRACTURES): Vit D, 25-Hydroxy: 32.2 ng/mL (ref 30.0–100.0)

## 2022-05-31 LAB — SEDIMENTATION RATE: Sed Rate: 15 mm/hr (ref 0–30)

## 2022-05-31 LAB — RPR: RPR Ser Ql: NONREACTIVE

## 2022-05-31 LAB — HGB A1C W/O EAG: Hgb A1c MFr Bld: 4.7 % — ABNORMAL LOW (ref 4.8–5.6)

## 2022-05-31 LAB — VITAMIN B1: Thiamine: 173 nmol/L (ref 66.5–200.0)

## 2022-05-31 LAB — B12 AND FOLATE PANEL
Folate: 6.6 ng/mL (ref >3.0)
Vitamin B-12: 398 pg/mL (ref 232–1245)

## 2022-05-31 LAB — VITAMIN B6

## 2022-05-31 LAB — AMMONIA: Ammonia: 45 ug/dL (ref 31–169)

## 2022-06-03 DIAGNOSIS — N186 End stage renal disease: Secondary | ICD-10-CM | POA: Diagnosis not present

## 2022-06-03 DIAGNOSIS — D689 Coagulation defect, unspecified: Secondary | ICD-10-CM | POA: Diagnosis not present

## 2022-06-03 DIAGNOSIS — Z23 Encounter for immunization: Secondary | ICD-10-CM | POA: Diagnosis not present

## 2022-06-03 DIAGNOSIS — Z992 Dependence on renal dialysis: Secondary | ICD-10-CM | POA: Diagnosis not present

## 2022-06-03 DIAGNOSIS — N2581 Secondary hyperparathyroidism of renal origin: Secondary | ICD-10-CM | POA: Diagnosis not present

## 2022-06-03 DIAGNOSIS — D631 Anemia in chronic kidney disease: Secondary | ICD-10-CM | POA: Diagnosis not present

## 2022-06-03 NOTE — Telephone Encounter (Signed)
I think patient can wait till next appt with PCP to have vitamin B6 checked with them. Please let them know.  All

## 2022-06-03 NOTE — Telephone Encounter (Signed)
Yes, apologies, sent message to Dr Rexene Alberts.

## 2022-06-03 NOTE — Telephone Encounter (Signed)
I spoke with pt's daughter, Madelin Headings and discussed. She stated the patient will see PCP the middle of next month (April). I told her I would send the message to Dr Dorthy Cooler and also asked her to request Vitamin B6 at that appointment. She verbalized understanding and appreciation.   This phone note has been forwarded to pt's PCP.

## 2022-06-03 NOTE — Telephone Encounter (Signed)
I think this is dr Guadelupe Sabin patient?

## 2022-06-03 NOTE — Telephone Encounter (Signed)
Received message from our billing manager. [facility] called and LVM stating they were unable to result the Vitamin B6. There was not enough samples/sample was discarded so they cannot run the B6.

## 2022-06-04 ENCOUNTER — Telehealth: Payer: Self-pay | Admitting: Neurology

## 2022-06-04 NOTE — Telephone Encounter (Signed)
HST- [employer] medicare no auth req.  Patient is scheduled at [facility] for 06/17/2022 at 1:30 pm.  Emailed patient daughter the appointment information.

## 2022-06-05 DIAGNOSIS — Z992 Dependence on renal dialysis: Secondary | ICD-10-CM | POA: Diagnosis not present

## 2022-06-05 DIAGNOSIS — N186 End stage renal disease: Secondary | ICD-10-CM | POA: Diagnosis not present

## 2022-06-05 DIAGNOSIS — Z23 Encounter for immunization: Secondary | ICD-10-CM | POA: Diagnosis not present

## 2022-06-05 DIAGNOSIS — N2581 Secondary hyperparathyroidism of renal origin: Secondary | ICD-10-CM | POA: Diagnosis not present

## 2022-06-05 DIAGNOSIS — D689 Coagulation defect, unspecified: Secondary | ICD-10-CM | POA: Diagnosis not present

## 2022-06-05 DIAGNOSIS — D631 Anemia in chronic kidney disease: Secondary | ICD-10-CM | POA: Diagnosis not present

## 2022-06-07 DIAGNOSIS — N186 End stage renal disease: Secondary | ICD-10-CM | POA: Diagnosis not present

## 2022-06-07 DIAGNOSIS — D689 Coagulation defect, unspecified: Secondary | ICD-10-CM | POA: Diagnosis not present

## 2022-06-07 DIAGNOSIS — N2581 Secondary hyperparathyroidism of renal origin: Secondary | ICD-10-CM | POA: Diagnosis not present

## 2022-06-07 DIAGNOSIS — D631 Anemia in chronic kidney disease: Secondary | ICD-10-CM | POA: Diagnosis not present

## 2022-06-07 DIAGNOSIS — Z23 Encounter for immunization: Secondary | ICD-10-CM | POA: Diagnosis not present

## 2022-06-07 DIAGNOSIS — Z992 Dependence on renal dialysis: Secondary | ICD-10-CM | POA: Diagnosis not present

## 2022-06-10 DIAGNOSIS — D689 Coagulation defect, unspecified: Secondary | ICD-10-CM | POA: Diagnosis not present

## 2022-06-10 DIAGNOSIS — Z992 Dependence on renal dialysis: Secondary | ICD-10-CM | POA: Diagnosis not present

## 2022-06-10 DIAGNOSIS — N2581 Secondary hyperparathyroidism of renal origin: Secondary | ICD-10-CM | POA: Diagnosis not present

## 2022-06-10 DIAGNOSIS — D631 Anemia in chronic kidney disease: Secondary | ICD-10-CM | POA: Diagnosis not present

## 2022-06-10 DIAGNOSIS — N186 End stage renal disease: Secondary | ICD-10-CM | POA: Diagnosis not present

## 2022-06-10 DIAGNOSIS — Z23 Encounter for immunization: Secondary | ICD-10-CM | POA: Diagnosis not present

## 2022-06-11 ENCOUNTER — Ambulatory Visit
Admission: RE | Admit: 2022-06-11 | Discharge: 2022-06-11 | Disposition: A | Discharge status: Home / Self Care | Payer: 59 | Source: Ambulatory Visit | Attending: Neurology | Admitting: Neurology

## 2022-06-11 DIAGNOSIS — G479 Sleep disorder, unspecified: Secondary | ICD-10-CM | POA: Diagnosis not present

## 2022-06-11 DIAGNOSIS — I779 Disorder of arteries and arterioles, unspecified: Secondary | ICD-10-CM | POA: Diagnosis not present

## 2022-06-11 DIAGNOSIS — Z9189 Other specified personal risk factors, not elsewhere classified: Secondary | ICD-10-CM

## 2022-06-11 DIAGNOSIS — R351 Nocturia: Secondary | ICD-10-CM

## 2022-06-11 DIAGNOSIS — Z955 Presence of coronary angioplasty implant and graft: Secondary | ICD-10-CM | POA: Diagnosis not present

## 2022-06-11 DIAGNOSIS — R413 Other amnesia: Secondary | ICD-10-CM

## 2022-06-11 DIAGNOSIS — Z87891 Personal history of nicotine dependence: Secondary | ICD-10-CM | POA: Diagnosis not present

## 2022-06-11 DIAGNOSIS — N186 End stage renal disease: Secondary | ICD-10-CM | POA: Diagnosis not present

## 2022-06-12 DIAGNOSIS — Z992 Dependence on renal dialysis: Secondary | ICD-10-CM | POA: Diagnosis not present

## 2022-06-12 DIAGNOSIS — D631 Anemia in chronic kidney disease: Secondary | ICD-10-CM | POA: Diagnosis not present

## 2022-06-12 DIAGNOSIS — Z23 Encounter for immunization: Secondary | ICD-10-CM | POA: Diagnosis not present

## 2022-06-12 DIAGNOSIS — N186 End stage renal disease: Secondary | ICD-10-CM | POA: Diagnosis not present

## 2022-06-12 DIAGNOSIS — N2581 Secondary hyperparathyroidism of renal origin: Secondary | ICD-10-CM | POA: Diagnosis not present

## 2022-06-12 DIAGNOSIS — D689 Coagulation defect, unspecified: Secondary | ICD-10-CM | POA: Diagnosis not present

## 2022-06-13 ENCOUNTER — Telehealth: Payer: Self-pay | Admitting: *Deleted

## 2022-06-13 NOTE — Telephone Encounter (Signed)
Called pt's daughter back and LVM (ok per DPR) advising her of Dr Guadelupe Sabin message below regarding her question about vascular dementia. Left office number for call back if needed.

## 2022-06-13 NOTE — Telephone Encounter (Signed)
He certainly has a complex medical history and also vascular risk factors, brain MRI would support underlying memory loss but is not diagnostic or definitive for any specific type of dementia.  On the brain MRI, he had mild vascular changes as well.

## 2022-06-13 NOTE — Telephone Encounter (Signed)
-----   Message from Star Age, MD sent at 06/12/2022  4:33 PM EDT ----- Please advise patient or his daughter on DPR that his brain MRI without contrast did not show any acute findings, he has volume loss which is chronic, which we call atrophy.  It is considered moderate.  As discussed, we will proceed with sleep testing.

## 2022-06-13 NOTE — Telephone Encounter (Signed)
I spoke with the patient's daughter [name] (on [place]) and discussed the MRI brain results which indicates moderate atrophy, no acute findings.  Patient has an appointment to pick up his home sleep test equipment on April 1 at 3:30 PM.  The daughter wanted to follow-up on the MRI findings as she states his primary care physician was concerned that he may have vascular dementia.  I did advise that the MRI results did not indicate that there were any vascular concerns but that I was unable to make any interpretations of results and would have to discuss with Dr. Rexene Alberts.  She was appreciative for the call.

## 2022-06-14 DIAGNOSIS — N2581 Secondary hyperparathyroidism of renal origin: Secondary | ICD-10-CM | POA: Diagnosis not present

## 2022-06-14 DIAGNOSIS — Z23 Encounter for immunization: Secondary | ICD-10-CM | POA: Diagnosis not present

## 2022-06-14 DIAGNOSIS — Z992 Dependence on renal dialysis: Secondary | ICD-10-CM | POA: Diagnosis not present

## 2022-06-14 DIAGNOSIS — D631 Anemia in chronic kidney disease: Secondary | ICD-10-CM | POA: Diagnosis not present

## 2022-06-14 DIAGNOSIS — D689 Coagulation defect, unspecified: Secondary | ICD-10-CM | POA: Diagnosis not present

## 2022-06-14 DIAGNOSIS — T8249XA Other complication of vascular dialysis catheter, initial encounter: Secondary | ICD-10-CM | POA: Diagnosis not present

## 2022-06-14 DIAGNOSIS — I871 Compression of vein: Secondary | ICD-10-CM | POA: Diagnosis not present

## 2022-06-14 DIAGNOSIS — N186 End stage renal disease: Secondary | ICD-10-CM | POA: Diagnosis not present

## 2022-06-16 DIAGNOSIS — Z992 Dependence on renal dialysis: Secondary | ICD-10-CM | POA: Diagnosis not present

## 2022-06-16 DIAGNOSIS — N186 End stage renal disease: Secondary | ICD-10-CM | POA: Diagnosis not present

## 2022-06-16 DIAGNOSIS — I129 Hypertensive chronic kidney disease with stage 1 through stage 4 chronic kidney disease, or unspecified chronic kidney disease: Secondary | ICD-10-CM | POA: Diagnosis not present

## 2022-06-17 ENCOUNTER — Ambulatory Visit (INDEPENDENT_AMBULATORY_CARE_PROVIDER_SITE_OTHER): Payer: Self-pay | Admitting: Neurology

## 2022-06-17 DIAGNOSIS — Z992 Dependence on renal dialysis: Secondary | ICD-10-CM | POA: Diagnosis not present

## 2022-06-17 DIAGNOSIS — N186 End stage renal disease: Secondary | ICD-10-CM

## 2022-06-17 DIAGNOSIS — Z23 Encounter for immunization: Secondary | ICD-10-CM | POA: Diagnosis not present

## 2022-06-17 DIAGNOSIS — Z87891 Personal history of nicotine dependence: Secondary | ICD-10-CM

## 2022-06-17 DIAGNOSIS — R351 Nocturia: Secondary | ICD-10-CM

## 2022-06-17 DIAGNOSIS — G479 Sleep disorder, unspecified: Secondary | ICD-10-CM

## 2022-06-17 DIAGNOSIS — Z955 Presence of coronary angioplasty implant and graft: Secondary | ICD-10-CM

## 2022-06-17 DIAGNOSIS — N2581 Secondary hyperparathyroidism of renal origin: Secondary | ICD-10-CM | POA: Diagnosis not present

## 2022-06-17 DIAGNOSIS — D631 Anemia in chronic kidney disease: Secondary | ICD-10-CM | POA: Diagnosis not present

## 2022-06-17 DIAGNOSIS — D689 Coagulation defect, unspecified: Secondary | ICD-10-CM | POA: Diagnosis not present

## 2022-06-17 DIAGNOSIS — Z9189 Other specified personal risk factors, not elsewhere classified: Secondary | ICD-10-CM

## 2022-06-17 DIAGNOSIS — R413 Other amnesia: Secondary | ICD-10-CM

## 2022-06-17 DIAGNOSIS — I779 Disorder of arteries and arterioles, unspecified: Secondary | ICD-10-CM

## 2022-06-19 DIAGNOSIS — N2581 Secondary hyperparathyroidism of renal origin: Secondary | ICD-10-CM | POA: Diagnosis not present

## 2022-06-19 DIAGNOSIS — Z23 Encounter for immunization: Secondary | ICD-10-CM | POA: Diagnosis not present

## 2022-06-19 DIAGNOSIS — Z992 Dependence on renal dialysis: Secondary | ICD-10-CM | POA: Diagnosis not present

## 2022-06-19 DIAGNOSIS — N186 End stage renal disease: Secondary | ICD-10-CM | POA: Diagnosis not present

## 2022-06-19 DIAGNOSIS — D631 Anemia in chronic kidney disease: Secondary | ICD-10-CM | POA: Diagnosis not present

## 2022-06-19 DIAGNOSIS — D689 Coagulation defect, unspecified: Secondary | ICD-10-CM | POA: Diagnosis not present

## 2022-06-21 DIAGNOSIS — Z992 Dependence on renal dialysis: Secondary | ICD-10-CM | POA: Diagnosis not present

## 2022-06-21 DIAGNOSIS — Z23 Encounter for immunization: Secondary | ICD-10-CM | POA: Diagnosis not present

## 2022-06-21 DIAGNOSIS — D631 Anemia in chronic kidney disease: Secondary | ICD-10-CM | POA: Diagnosis not present

## 2022-06-21 DIAGNOSIS — N186 End stage renal disease: Secondary | ICD-10-CM | POA: Diagnosis not present

## 2022-06-21 DIAGNOSIS — D689 Coagulation defect, unspecified: Secondary | ICD-10-CM | POA: Diagnosis not present

## 2022-06-21 DIAGNOSIS — N2581 Secondary hyperparathyroidism of renal origin: Secondary | ICD-10-CM | POA: Diagnosis not present

## 2022-06-24 DIAGNOSIS — D631 Anemia in chronic kidney disease: Secondary | ICD-10-CM | POA: Diagnosis not present

## 2022-06-24 DIAGNOSIS — Z992 Dependence on renal dialysis: Secondary | ICD-10-CM | POA: Diagnosis not present

## 2022-06-24 DIAGNOSIS — N2581 Secondary hyperparathyroidism of renal origin: Secondary | ICD-10-CM | POA: Diagnosis not present

## 2022-06-24 DIAGNOSIS — Z23 Encounter for immunization: Secondary | ICD-10-CM | POA: Diagnosis not present

## 2022-06-24 DIAGNOSIS — D689 Coagulation defect, unspecified: Secondary | ICD-10-CM | POA: Diagnosis not present

## 2022-06-24 DIAGNOSIS — N186 End stage renal disease: Secondary | ICD-10-CM | POA: Diagnosis not present

## 2022-06-26 DIAGNOSIS — D689 Coagulation defect, unspecified: Secondary | ICD-10-CM | POA: Diagnosis not present

## 2022-06-26 DIAGNOSIS — N186 End stage renal disease: Secondary | ICD-10-CM | POA: Diagnosis not present

## 2022-06-26 DIAGNOSIS — N2581 Secondary hyperparathyroidism of renal origin: Secondary | ICD-10-CM | POA: Diagnosis not present

## 2022-06-26 DIAGNOSIS — D631 Anemia in chronic kidney disease: Secondary | ICD-10-CM | POA: Diagnosis not present

## 2022-06-26 DIAGNOSIS — Z992 Dependence on renal dialysis: Secondary | ICD-10-CM | POA: Diagnosis not present

## 2022-06-26 DIAGNOSIS — Z23 Encounter for immunization: Secondary | ICD-10-CM | POA: Diagnosis not present

## 2022-06-28 DIAGNOSIS — D631 Anemia in chronic kidney disease: Secondary | ICD-10-CM | POA: Diagnosis not present

## 2022-06-28 DIAGNOSIS — N186 End stage renal disease: Secondary | ICD-10-CM | POA: Diagnosis not present

## 2022-06-28 DIAGNOSIS — Z23 Encounter for immunization: Secondary | ICD-10-CM | POA: Diagnosis not present

## 2022-06-28 DIAGNOSIS — N2581 Secondary hyperparathyroidism of renal origin: Secondary | ICD-10-CM | POA: Diagnosis not present

## 2022-06-28 DIAGNOSIS — Z992 Dependence on renal dialysis: Secondary | ICD-10-CM | POA: Diagnosis not present

## 2022-06-28 DIAGNOSIS — D689 Coagulation defect, unspecified: Secondary | ICD-10-CM | POA: Diagnosis not present

## 2022-07-01 DIAGNOSIS — Z23 Encounter for immunization: Secondary | ICD-10-CM | POA: Diagnosis not present

## 2022-07-01 DIAGNOSIS — N186 End stage renal disease: Secondary | ICD-10-CM | POA: Diagnosis not present

## 2022-07-01 DIAGNOSIS — Z992 Dependence on renal dialysis: Secondary | ICD-10-CM | POA: Diagnosis not present

## 2022-07-01 DIAGNOSIS — D631 Anemia in chronic kidney disease: Secondary | ICD-10-CM | POA: Diagnosis not present

## 2022-07-01 DIAGNOSIS — N2581 Secondary hyperparathyroidism of renal origin: Secondary | ICD-10-CM | POA: Diagnosis not present

## 2022-07-01 DIAGNOSIS — D689 Coagulation defect, unspecified: Secondary | ICD-10-CM | POA: Diagnosis not present

## 2022-07-03 DIAGNOSIS — Z992 Dependence on renal dialysis: Secondary | ICD-10-CM | POA: Diagnosis not present

## 2022-07-03 DIAGNOSIS — D689 Coagulation defect, unspecified: Secondary | ICD-10-CM | POA: Diagnosis not present

## 2022-07-03 DIAGNOSIS — N2581 Secondary hyperparathyroidism of renal origin: Secondary | ICD-10-CM | POA: Diagnosis not present

## 2022-07-03 DIAGNOSIS — D631 Anemia in chronic kidney disease: Secondary | ICD-10-CM | POA: Diagnosis not present

## 2022-07-03 DIAGNOSIS — N186 End stage renal disease: Secondary | ICD-10-CM | POA: Diagnosis not present

## 2022-07-03 DIAGNOSIS — Z23 Encounter for immunization: Secondary | ICD-10-CM | POA: Diagnosis not present

## 2022-07-05 DIAGNOSIS — D631 Anemia in chronic kidney disease: Secondary | ICD-10-CM | POA: Diagnosis not present

## 2022-07-05 DIAGNOSIS — N186 End stage renal disease: Secondary | ICD-10-CM | POA: Diagnosis not present

## 2022-07-05 DIAGNOSIS — N2581 Secondary hyperparathyroidism of renal origin: Secondary | ICD-10-CM | POA: Diagnosis not present

## 2022-07-05 DIAGNOSIS — D689 Coagulation defect, unspecified: Secondary | ICD-10-CM | POA: Diagnosis not present

## 2022-07-05 DIAGNOSIS — Z23 Encounter for immunization: Secondary | ICD-10-CM | POA: Diagnosis not present

## 2022-07-05 DIAGNOSIS — Z992 Dependence on renal dialysis: Secondary | ICD-10-CM | POA: Diagnosis not present

## 2022-07-08 DIAGNOSIS — N186 End stage renal disease: Secondary | ICD-10-CM | POA: Diagnosis not present

## 2022-07-08 DIAGNOSIS — D689 Coagulation defect, unspecified: Secondary | ICD-10-CM | POA: Diagnosis not present

## 2022-07-08 DIAGNOSIS — N2581 Secondary hyperparathyroidism of renal origin: Secondary | ICD-10-CM | POA: Diagnosis not present

## 2022-07-08 DIAGNOSIS — Z23 Encounter for immunization: Secondary | ICD-10-CM | POA: Diagnosis not present

## 2022-07-08 DIAGNOSIS — D631 Anemia in chronic kidney disease: Secondary | ICD-10-CM | POA: Diagnosis not present

## 2022-07-08 DIAGNOSIS — Z992 Dependence on renal dialysis: Secondary | ICD-10-CM | POA: Diagnosis not present

## 2022-07-09 ENCOUNTER — Ambulatory Visit (INDEPENDENT_AMBULATORY_CARE_PROVIDER_SITE_OTHER): Payer: Self-pay | Admitting: Neurology

## 2022-07-09 DIAGNOSIS — I700 Atherosclerosis of aorta: Secondary | ICD-10-CM | POA: Diagnosis not present

## 2022-07-09 DIAGNOSIS — Z992 Dependence on renal dialysis: Secondary | ICD-10-CM | POA: Diagnosis not present

## 2022-07-09 DIAGNOSIS — I739 Peripheral vascular disease, unspecified: Secondary | ICD-10-CM | POA: Diagnosis not present

## 2022-07-09 DIAGNOSIS — I5032 Chronic diastolic (congestive) heart failure: Secondary | ICD-10-CM | POA: Diagnosis not present

## 2022-07-09 DIAGNOSIS — N186 End stage renal disease: Secondary | ICD-10-CM | POA: Diagnosis not present

## 2022-07-09 DIAGNOSIS — E7800 Pure hypercholesterolemia, unspecified: Secondary | ICD-10-CM | POA: Diagnosis not present

## 2022-07-09 DIAGNOSIS — I25118 Atherosclerotic heart disease of native coronary artery with other forms of angina pectoris: Secondary | ICD-10-CM | POA: Diagnosis not present

## 2022-07-09 DIAGNOSIS — I480 Paroxysmal atrial fibrillation: Secondary | ICD-10-CM | POA: Diagnosis not present

## 2022-07-09 DIAGNOSIS — Z79899 Other long term (current) drug therapy: Secondary | ICD-10-CM | POA: Diagnosis not present

## 2022-07-09 DIAGNOSIS — K529 Noninfective gastroenteritis and colitis, unspecified: Secondary | ICD-10-CM | POA: Diagnosis not present

## 2022-07-09 DIAGNOSIS — D6869 Other thrombophilia: Secondary | ICD-10-CM | POA: Diagnosis not present

## 2022-07-09 DIAGNOSIS — G479 Sleep disorder, unspecified: Secondary | ICD-10-CM

## 2022-07-10 DIAGNOSIS — N2581 Secondary hyperparathyroidism of renal origin: Secondary | ICD-10-CM | POA: Diagnosis not present

## 2022-07-10 DIAGNOSIS — N186 End stage renal disease: Secondary | ICD-10-CM | POA: Diagnosis not present

## 2022-07-10 DIAGNOSIS — D631 Anemia in chronic kidney disease: Secondary | ICD-10-CM | POA: Diagnosis not present

## 2022-07-10 DIAGNOSIS — Z992 Dependence on renal dialysis: Secondary | ICD-10-CM | POA: Diagnosis not present

## 2022-07-10 DIAGNOSIS — D689 Coagulation defect, unspecified: Secondary | ICD-10-CM | POA: Diagnosis not present

## 2022-07-10 DIAGNOSIS — Z23 Encounter for immunization: Secondary | ICD-10-CM | POA: Diagnosis not present

## 2022-07-12 DIAGNOSIS — N186 End stage renal disease: Secondary | ICD-10-CM | POA: Diagnosis not present

## 2022-07-12 DIAGNOSIS — D631 Anemia in chronic kidney disease: Secondary | ICD-10-CM | POA: Diagnosis not present

## 2022-07-12 DIAGNOSIS — N2581 Secondary hyperparathyroidism of renal origin: Secondary | ICD-10-CM | POA: Diagnosis not present

## 2022-07-12 DIAGNOSIS — D689 Coagulation defect, unspecified: Secondary | ICD-10-CM | POA: Diagnosis not present

## 2022-07-12 DIAGNOSIS — Z23 Encounter for immunization: Secondary | ICD-10-CM | POA: Diagnosis not present

## 2022-07-12 DIAGNOSIS — Z992 Dependence on renal dialysis: Secondary | ICD-10-CM | POA: Diagnosis not present

## 2022-07-15 DIAGNOSIS — N186 End stage renal disease: Secondary | ICD-10-CM | POA: Diagnosis not present

## 2022-07-15 DIAGNOSIS — D631 Anemia in chronic kidney disease: Secondary | ICD-10-CM | POA: Diagnosis not present

## 2022-07-15 DIAGNOSIS — Z23 Encounter for immunization: Secondary | ICD-10-CM | POA: Diagnosis not present

## 2022-07-15 DIAGNOSIS — Z992 Dependence on renal dialysis: Secondary | ICD-10-CM | POA: Diagnosis not present

## 2022-07-15 DIAGNOSIS — N2581 Secondary hyperparathyroidism of renal origin: Secondary | ICD-10-CM | POA: Diagnosis not present

## 2022-07-15 DIAGNOSIS — D689 Coagulation defect, unspecified: Secondary | ICD-10-CM | POA: Diagnosis not present

## 2022-07-16 ENCOUNTER — Telehealth: Payer: Self-pay

## 2022-07-16 DIAGNOSIS — N186 End stage renal disease: Secondary | ICD-10-CM | POA: Diagnosis not present

## 2022-07-16 DIAGNOSIS — I129 Hypertensive chronic kidney disease with stage 1 through stage 4 chronic kidney disease, or unspecified chronic kidney disease: Secondary | ICD-10-CM | POA: Diagnosis not present

## 2022-07-16 DIAGNOSIS — Z992 Dependence on renal dialysis: Secondary | ICD-10-CM | POA: Diagnosis not present

## 2022-07-16 NOTE — Telephone Encounter (Signed)
No study found on device either time pt has been given device for SS. (4/1, 4/22) - sent to Dr. Frances Furbish for review on next steps.

## 2022-07-17 DIAGNOSIS — N2581 Secondary hyperparathyroidism of renal origin: Secondary | ICD-10-CM | POA: Diagnosis not present

## 2022-07-17 DIAGNOSIS — Z992 Dependence on renal dialysis: Secondary | ICD-10-CM | POA: Diagnosis not present

## 2022-07-17 DIAGNOSIS — D689 Coagulation defect, unspecified: Secondary | ICD-10-CM | POA: Diagnosis not present

## 2022-07-17 DIAGNOSIS — N186 End stage renal disease: Secondary | ICD-10-CM | POA: Diagnosis not present

## 2022-07-19 DIAGNOSIS — D689 Coagulation defect, unspecified: Secondary | ICD-10-CM | POA: Diagnosis not present

## 2022-07-19 DIAGNOSIS — N186 End stage renal disease: Secondary | ICD-10-CM | POA: Diagnosis not present

## 2022-07-19 DIAGNOSIS — N2581 Secondary hyperparathyroidism of renal origin: Secondary | ICD-10-CM | POA: Diagnosis not present

## 2022-07-19 DIAGNOSIS — Z992 Dependence on renal dialysis: Secondary | ICD-10-CM | POA: Diagnosis not present

## 2022-07-22 DIAGNOSIS — Z992 Dependence on renal dialysis: Secondary | ICD-10-CM | POA: Diagnosis not present

## 2022-07-22 DIAGNOSIS — D689 Coagulation defect, unspecified: Secondary | ICD-10-CM | POA: Diagnosis not present

## 2022-07-22 DIAGNOSIS — N2581 Secondary hyperparathyroidism of renal origin: Secondary | ICD-10-CM | POA: Diagnosis not present

## 2022-07-22 DIAGNOSIS — N186 End stage renal disease: Secondary | ICD-10-CM | POA: Diagnosis not present

## 2022-07-24 DIAGNOSIS — N186 End stage renal disease: Secondary | ICD-10-CM | POA: Diagnosis not present

## 2022-07-24 DIAGNOSIS — D689 Coagulation defect, unspecified: Secondary | ICD-10-CM | POA: Diagnosis not present

## 2022-07-24 DIAGNOSIS — N2581 Secondary hyperparathyroidism of renal origin: Secondary | ICD-10-CM | POA: Diagnosis not present

## 2022-07-24 DIAGNOSIS — Z992 Dependence on renal dialysis: Secondary | ICD-10-CM | POA: Diagnosis not present

## 2022-07-26 DIAGNOSIS — Z992 Dependence on renal dialysis: Secondary | ICD-10-CM | POA: Diagnosis not present

## 2022-07-26 DIAGNOSIS — D689 Coagulation defect, unspecified: Secondary | ICD-10-CM | POA: Diagnosis not present

## 2022-07-26 DIAGNOSIS — N2581 Secondary hyperparathyroidism of renal origin: Secondary | ICD-10-CM | POA: Diagnosis not present

## 2022-07-26 DIAGNOSIS — N186 End stage renal disease: Secondary | ICD-10-CM | POA: Diagnosis not present

## 2022-07-29 DIAGNOSIS — R55 Syncope and collapse: Secondary | ICD-10-CM | POA: Diagnosis not present

## 2022-07-29 DIAGNOSIS — R404 Transient alteration of awareness: Secondary | ICD-10-CM | POA: Diagnosis not present

## 2022-08-06 NOTE — Telephone Encounter (Signed)
I am sorry to hear this. Thank you for notifying me.

## 2022-08-14 ENCOUNTER — Encounter: Payer: Self-pay | Admitting: Cardiovascular Disease

## 2022-08-14 NOTE — Telephone Encounter (Signed)
Error

## 2022-08-15 ENCOUNTER — Telehealth: Payer: Self-pay | Admitting: Cardiovascular Disease

## 2022-08-15 NOTE — Telephone Encounter (Signed)
We received notification of patient passing. I have requested verification so that the chart can be properly marked. The death certificate will be sent to Dr. Docia Chuck or Dialysis team.

## 2022-08-17 DEATH — deceased
